# Patient Record
Sex: Male | Born: 1940
Health system: Southern US, Community
[De-identification: ages and names within clinical notes are randomized; demographics above are authoritative.]

## PROBLEM LIST (undated history)

## (undated) DIAGNOSIS — D369 Benign neoplasm, unspecified site: Secondary | ICD-10-CM

## (undated) DIAGNOSIS — R0789 Other chest pain: Secondary | ICD-10-CM

## (undated) DIAGNOSIS — Q819 Epidermolysis bullosa, unspecified: Secondary | ICD-10-CM

## (undated) DIAGNOSIS — I10 Essential (primary) hypertension: Secondary | ICD-10-CM

## (undated) DIAGNOSIS — F1021 Alcohol dependence, in remission: Secondary | ICD-10-CM

## (undated) DIAGNOSIS — R161 Splenomegaly, not elsewhere classified: Secondary | ICD-10-CM

## (undated) DIAGNOSIS — S72009A Fracture of unspecified part of neck of unspecified femur, initial encounter for closed fracture: Secondary | ICD-10-CM

## (undated) HISTORY — DX: Fracture of unspecified part of neck of unspecified femur, initial encounter for closed fracture: S72.009A

## (undated) HISTORY — PX: OTHER SURGICAL HISTORY: SHX169

## (undated) HISTORY — DX: Other chest pain: R07.89

## (undated) HISTORY — DX: Alcohol dependence, in remission: F10.21

## (undated) HISTORY — DX: Benign neoplasm, unspecified site: D36.9

## (undated) HISTORY — DX: Epidermolysis bullosa, unspecified: Q81.9

## (undated) HISTORY — DX: Splenomegaly, not elsewhere classified: R16.1

---

## 1999-05-10 ENCOUNTER — Emergency Department (HOSPITAL_COMMUNITY): Admission: EM | Admit: 1999-05-10 | Discharge: 1999-05-11 | Payer: Self-pay | Admitting: Emergency Medicine

## 1999-05-10 ENCOUNTER — Encounter: Payer: Self-pay | Admitting: Emergency Medicine

## 2000-07-01 ENCOUNTER — Encounter (INDEPENDENT_AMBULATORY_CARE_PROVIDER_SITE_OTHER): Payer: Self-pay | Admitting: *Deleted

## 2000-07-01 ENCOUNTER — Ambulatory Visit (HOSPITAL_COMMUNITY): Admission: RE | Admit: 2000-07-01 | Discharge: 2000-07-01 | Payer: Self-pay | Admitting: Gastroenterology

## 2000-08-27 ENCOUNTER — Encounter: Admission: RE | Admit: 2000-08-27 | Discharge: 2000-08-27 | Payer: Self-pay | Admitting: Neurology

## 2000-08-27 ENCOUNTER — Encounter: Payer: Self-pay | Admitting: Neurology

## 2004-03-25 ENCOUNTER — Ambulatory Visit (HOSPITAL_COMMUNITY): Admission: RE | Admit: 2004-03-25 | Discharge: 2004-03-25 | Payer: Self-pay | Admitting: Gastroenterology

## 2008-03-17 ENCOUNTER — Ambulatory Visit: Payer: Self-pay | Admitting: Internal Medicine

## 2008-03-24 ENCOUNTER — Encounter: Admission: RE | Admit: 2008-03-24 | Discharge: 2008-03-24 | Payer: Self-pay | Admitting: Internal Medicine

## 2009-06-22 ENCOUNTER — Ambulatory Visit: Payer: Self-pay | Admitting: Internal Medicine

## 2009-08-10 LAB — HM COLONOSCOPY

## 2010-05-31 NOTE — Procedures (Signed)
[place]  Patient:    Carlos [place], Carlos [place]                       MRN: [id number] Proc. Date: 07/01/00 Adm. Date:  [id number] Attending:  Rich Brave CC:         Luanna Cole. Lenord Fellers, M.D.   Procedure Report  PROCEDURE PERFORMED:  Colonoscopy with biopsies.  ENDOSCOPIST:  Florencia Reasons, M.D.  INDICATIONS FOR PROCEDURE:  The patient is a 70 year old gentleman with occasional rectal bleeding, for colon cancer screening.  FINDINGS:  Several diminutive sessile polyps, cold biopsied.  DESCRIPTION OF PROCEDURE:  The nature, purpose and risks of the procedure had been discussed with the patient, who provided written consent.  Sedation was fentanyl 40 mcg and Versed 4 mg IV without arrhythmias or desaturation.  Digital exam of the prostate was normal.  There did appear to be some hemorrhoidal skin tags and perianal skin excoriation.  The Olympus adult video colonoscope was advanced without difficulty to the cecum as identified by visualization of the appendiceal orifice and pull-back was then performed. The quality of the prep was excellent and it is felt that all areas were well seen.  There was a diminutive sessile polyp in the ascending colon and another one in the transverse colon, each removed by one or two cold biopsies, and also a couple of small hyperplastic-appearing sessile polyps measuring 2 or 3 mm in the rectum, also cold biopsied.  Retroflexion in the rectum prior to the rectal biopsies was unremarkable and pull-out through the anal canal demonstrated just small internal hemorrhoids.  The patient did have some mild sigmoid diverticulosis.  No colitis, large polyps, cancer or vascular malformations were seen.  The patient tolerated the procedure well and there were no apparent complications.  IMPRESSION: 1. Diminutive polyps as noted above. 2. Sigmoid diverticulosis. 3. No definite source for intermittent rectal bleeding  observed, so it is    presumed to be of hemorrhoidal origin.  PLAN:  Await pathology. DD:  07/01/00 TD:  07/01/00 Job: 2009 [id number]

## 2010-05-31 NOTE — Op Note (Signed)
Carlos [place], Carlos White                ACCOUNT NO.:  [id number]   MEDICAL RECORD NO.:  [id number]          PATIENT TYPE:  AMB   LOCATION:  ENDO                         FACILITY:  [facility]   PHYSICIAN:  Bernette Redbird, M.D.   DATE OF BIRTH:  December 27, 1940   DATE OF PROCEDURE:  03/25/2004  DATE OF DISCHARGE:                                 OPERATIVE REPORT   PROCEDURE:  Colonoscopy.   INDICATION:  Follow-up of small colonic adenomas from about four years ago.   FINDINGS:  Normal except for mild diverticulosis.   PROCEDURE:  The nature, purpose, risks of the procedure were familiar to the  patient from prior examination, he provided written consent. Sedation was  fentanyl 70 mcg and Versed 8 milligrams IV without arrhythmias or  desaturation. Digital exam of the prostate was unremarkable. The Olympus  adult adjustable tension video colonoscope was readily advanced to the cecum  as identified by visualization of the appendiceal orifice and the absence of  further lumen and pullback was then performed. The quality of prep was  excellent. It is felt that all areas were well seen.   There was some mild sigmoid diverticulosis but this was otherwise normal  examination. No polyps, cancer, colitis or vascular malformations were  observed. Retroflexion of the rectum and reinspection of the rectum were  otherwise unremarkable. No biopsies were obtained. The patient tolerated the  procedure well and there no apparent complications.   IMPRESSION:  1.  Prior history of colonic adenomas, without worrisome findings on current      examination (V12.72).  2.  Sigmoid diverticulosis.   PLAN:  Follow-up colonoscopy in five years.      RB/MEDQ  D:  03/25/2004  T:  03/25/2004  Job:  [id number]   cc:   Luanna Cole. Lenord Fellers, M.D.  [address], [place]  [place]  [place] [address]  Fax: [phone]

## 2010-06-14 ENCOUNTER — Encounter: Payer: Self-pay | Admitting: Internal Medicine

## 2010-06-14 ENCOUNTER — Ambulatory Visit (INDEPENDENT_AMBULATORY_CARE_PROVIDER_SITE_OTHER): Payer: Medicare Other | Admitting: Internal Medicine

## 2010-06-14 DIAGNOSIS — I10 Essential (primary) hypertension: Secondary | ICD-10-CM

## 2010-06-14 DIAGNOSIS — Q819 Epidermolysis bullosa, unspecified: Secondary | ICD-10-CM

## 2010-06-14 DIAGNOSIS — D126 Benign neoplasm of colon, unspecified: Secondary | ICD-10-CM | POA: Insufficient documentation

## 2010-06-14 DIAGNOSIS — Q828 Other specified congenital malformations of skin: Secondary | ICD-10-CM

## 2010-06-14 DIAGNOSIS — F1021 Alcohol dependence, in remission: Secondary | ICD-10-CM

## 2010-06-14 DIAGNOSIS — N400 Enlarged prostate without lower urinary tract symptoms: Secondary | ICD-10-CM

## 2010-07-04 ENCOUNTER — Encounter: Payer: Self-pay | Admitting: Internal Medicine

## 2010-07-13 ENCOUNTER — Encounter: Payer: Self-pay | Admitting: Internal Medicine

## 2010-07-13 DIAGNOSIS — I10 Essential (primary) hypertension: Secondary | ICD-10-CM | POA: Insufficient documentation

## 2010-07-13 DIAGNOSIS — N400 Enlarged prostate without lower urinary tract symptoms: Secondary | ICD-10-CM | POA: Insufficient documentation

## 2010-07-13 NOTE — Progress Notes (Signed)
  Subjective:    Patient ID: Carlos [place]., male    DOB: 04-03-1940, 70 y.o.   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 History of chest pain seen by  cardiologist 04/10/2008. Had fair but reduced exercise tolerance. Test was stopped because of fatigue. Dr. Deborah Chalk suggested he lose to around 200 pounds. No evidence of ischemia on exercise stress test. Patient brings in multiple blood pressure readings ranging from 128 systolically to 161 systolically over the past several days. Family history of hypertension. Diastolic pressures have been within normal limits.    Review of Systems     Objective:   Physical Exam neck supple without carotid bruits; chest clear to auscultation; cardiac exam regular rate and rhythm normal S1/S2 without gallop; extremities trace lower extremity edema        Assessment & Plan:  New onset hypertension  Benign prostatic hypertrophy treated with Flomax  Recovering alcoholic sober for many years  History of epidermolysis bullosa  Plan patient will get fasting lab work in the near future and return during the week of July for for physical examination at his request. I think he should start Norvasc generic 5 mg daily and we'll reevaluate him and review his lab work at time of physical exam

## 2010-07-19 ENCOUNTER — Telehealth: Payer: Self-pay

## 2010-07-19 ENCOUNTER — Encounter: Payer: Self-pay | Admitting: Internal Medicine

## 2010-07-19 ENCOUNTER — Ambulatory Visit (INDEPENDENT_AMBULATORY_CARE_PROVIDER_SITE_OTHER): Payer: Medicare Other | Admitting: Internal Medicine

## 2010-07-19 VITALS — BP 112/56 | HR 92 | Temp 97.5°F | Resp 20 | Ht 70.5 in | Wt 222.0 lb

## 2010-07-19 DIAGNOSIS — N400 Enlarged prostate without lower urinary tract symptoms: Secondary | ICD-10-CM

## 2010-07-19 DIAGNOSIS — Q819 Epidermolysis bullosa, unspecified: Secondary | ICD-10-CM

## 2010-07-19 DIAGNOSIS — Z0000 Encounter for general adult medical examination without abnormal findings: Secondary | ICD-10-CM

## 2010-07-19 DIAGNOSIS — I10 Essential (primary) hypertension: Secondary | ICD-10-CM

## 2010-07-19 DIAGNOSIS — D369 Benign neoplasm, unspecified site: Secondary | ICD-10-CM

## 2010-07-19 LAB — BASIC METABOLIC PANEL
BUN: 12 mg/dL (ref 6–23)
Chloride: 104 mEq/L (ref 96–112)
Creat: 0.87 mg/dL (ref 0.50–1.35)
Glucose, Bld: 73 mg/dL (ref 70–99)
Potassium: 4 mEq/L (ref 3.5–5.3)

## 2010-07-19 LAB — POCT URINALYSIS DIPSTICK
Bilirubin, UA: NEGATIVE
Blood, UA: NEGATIVE
Glucose, UA: NEGATIVE
Nitrite, UA: NEGATIVE
Spec Grav, UA: 1.01
Urobilinogen, UA: NEGATIVE
pH, UA: 7

## 2010-07-19 NOTE — Progress Notes (Signed)
Addended by: Nolen Mu on: 07/19/2010 03:47 PM   Modules accepted: Orders, Level of Service
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
## 2010-07-19 NOTE — Progress Notes (Signed)
Addended by: Judy Pimple on: 07/19/2010 04:13 PM   Modules accepted: Orders

## 2010-07-19 NOTE — Patient Instructions (Addendum)
Continue amlodipine 5 mg daily. Start Cozaar 50 mg daily. Take these medications 2 hours apart. Addendum. Pt was not placed on amlodipine at last visit as previously stated. He was placed on Cozaar 50 mg daily. B met obtained today. Increase Cozaar to 100 mg daily and follow up in 2 months.

## 2010-07-19 NOTE — Progress Notes (Signed)
  Subjective:    Patient ID: Carlos [place]., male    DOB: 16-Oct-1940, 70 y.o.   MRN: [id number]  HPI    Review of Systems     Objective:   Physical Exam        Assessment & Plan:  Addendum: Patient was not placed on amlodipine at last office visit his originally stated. Patient was instead placed on Cozaar 50 mg daily. At this time, we will increase Cozaar to 100 mg daily. Patient will return in 2 months for office visit blood pressure check and B- met

## 2010-07-22 ENCOUNTER — Encounter: Payer: Self-pay | Admitting: Internal Medicine

## 2010-07-26 NOTE — Telephone Encounter (Signed)
Refills completed

## 2010-08-22 ENCOUNTER — Encounter: Payer: Self-pay | Admitting: Internal Medicine

## 2010-08-23 ENCOUNTER — Other Ambulatory Visit: Payer: Self-pay | Admitting: Internal Medicine

## 2010-09-20 ENCOUNTER — Ambulatory Visit: Payer: Medicare Other | Admitting: Internal Medicine

## 2010-09-27 ENCOUNTER — Encounter: Payer: Self-pay | Admitting: Internal Medicine

## 2010-09-27 ENCOUNTER — Ambulatory Visit (INDEPENDENT_AMBULATORY_CARE_PROVIDER_SITE_OTHER): Payer: Medicare Other | Admitting: Internal Medicine

## 2010-09-27 VITALS — BP 130/66 | HR 80 | Temp 97.2°F | Wt 222.0 lb

## 2010-09-27 DIAGNOSIS — Q828 Other specified congenital malformations of skin: Secondary | ICD-10-CM

## 2010-09-27 DIAGNOSIS — Q819 Epidermolysis bullosa, unspecified: Secondary | ICD-10-CM

## 2010-09-27 DIAGNOSIS — I10 Essential (primary) hypertension: Secondary | ICD-10-CM

## 2010-09-27 DIAGNOSIS — N400 Enlarged prostate without lower urinary tract symptoms: Secondary | ICD-10-CM

## 2010-09-30 ENCOUNTER — Encounter: Payer: Self-pay | Admitting: Internal Medicine

## 2010-09-30 NOTE — Progress Notes (Signed)
  Subjective:    Patient ID: Carlos [place]., male    DOB: 07-16-1940, 70 y.o.   MRN: [id number]  HPI 70 year old white male with history of hypertension. We recently increased losartan from 50 mg to -100 mg daily and got better control of his blood pressure. He feels well on this dose. Says he will be cutting back on his work at [employer] in the next few weeks. History of BPH for which he takes Flomax. Takes Tramadol when necessary back pain. Is a recovering alcoholic for many years. He is an excellent alcohol counselor. Also has history of epidermolysis bullosa.    Review of Systems     Objective:   Physical Exam chest clear to auscultation; cardiac exam regular rate and rhythm; extremities without edema        Assessment & Plan:  Hypertension  BPH  Epidermolysis bullosa  Patient will get influenza immunization at [facility]. Return in 6 months for physical exam.

## 2010-12-05 ENCOUNTER — Other Ambulatory Visit: Payer: Self-pay | Admitting: Internal Medicine

## 2011-03-28 ENCOUNTER — Ambulatory Visit: Payer: Medicare Other | Admitting: Internal Medicine

## 2011-04-03 ENCOUNTER — Other Ambulatory Visit: Payer: Self-pay | Admitting: Internal Medicine

## 2011-07-25 ENCOUNTER — Other Ambulatory Visit: Payer: Medicare Other | Admitting: Internal Medicine

## 2011-07-25 ENCOUNTER — Ambulatory Visit: Payer: Medicare Other | Admitting: Internal Medicine

## 2011-07-28 ENCOUNTER — Encounter: Payer: Medicare Other | Admitting: Internal Medicine

## 2011-08-01 ENCOUNTER — Other Ambulatory Visit: Payer: Self-pay | Admitting: Internal Medicine

## 2011-08-01 NOTE — Telephone Encounter (Signed)
Please refill Tramadol

## 2011-08-04 ENCOUNTER — Other Ambulatory Visit: Payer: Self-pay

## 2011-08-04 MED ORDER — TRAMADOL HCL 50 MG PO TABS: 50.0000 mg | ORAL_TABLET | Freq: Three times a day (TID) | ORAL | Status: DC | PRN

## 2011-08-12 ENCOUNTER — Other Ambulatory Visit: Payer: Self-pay | Admitting: Internal Medicine

## 2011-08-14 ENCOUNTER — Other Ambulatory Visit: Payer: Medicare Other | Admitting: Internal Medicine

## 2011-08-14 DIAGNOSIS — N400 Enlarged prostate without lower urinary tract symptoms: Secondary | ICD-10-CM

## 2011-08-14 DIAGNOSIS — Z125 Encounter for screening for malignant neoplasm of prostate: Secondary | ICD-10-CM

## 2011-08-14 DIAGNOSIS — I10 Essential (primary) hypertension: Secondary | ICD-10-CM

## 2011-08-14 LAB — LIPID PANEL
Cholesterol: 166 mg/dL (ref 0–200)
Triglycerides: 31 mg/dL (ref <150)
VLDL: 6 mg/dL (ref 0–40)

## 2011-08-14 LAB — CBC WITH DIFFERENTIAL/PLATELET
Basophils Relative: 1 % (ref 0–1)
HCT: 38 % — ABNORMAL LOW (ref 39.0–52.0)
MCHC: 33.7 g/dL (ref 30.0–36.0)
Monocytes Absolute: 0.3 10*3/uL (ref 0.1–1.0)
Neutro Abs: 1.7 10*3/uL (ref 1.7–7.7)
Platelets: 181 10*3/uL (ref 150–400)
RDW: 13.3 % (ref 11.5–15.5)

## 2011-08-14 LAB — COMPREHENSIVE METABOLIC PANEL
ALT: 9 U/L (ref 0–53)
Albumin: 4.2 g/dL (ref 3.5–5.2)
CO2: 28 mEq/L (ref 19–32)
Calcium: 9.7 mg/dL (ref 8.4–10.5)
Chloride: 105 mEq/L (ref 96–112)
Glucose, Bld: 95 mg/dL (ref 70–99)
Sodium: 141 mEq/L (ref 135–145)
Total Bilirubin: 0.5 mg/dL (ref 0.3–1.2)
Total Protein: 6.5 g/dL (ref 6.0–8.3)

## 2011-08-15 ENCOUNTER — Ambulatory Visit (INDEPENDENT_AMBULATORY_CARE_PROVIDER_SITE_OTHER): Payer: Medicare Other | Admitting: Internal Medicine

## 2011-08-15 ENCOUNTER — Encounter: Payer: Self-pay | Admitting: Internal Medicine

## 2011-08-15 VITALS — BP 120/80 | HR 84 | Temp 98.0°F | Resp 12 | Ht 69.75 in | Wt 240.0 lb

## 2011-08-15 DIAGNOSIS — Z86010 Personal history of colonic polyps: Secondary | ICD-10-CM

## 2011-08-15 DIAGNOSIS — Q828 Other specified congenital malformations of skin: Secondary | ICD-10-CM

## 2011-08-15 DIAGNOSIS — Z0000 Encounter for general adult medical examination without abnormal findings: Secondary | ICD-10-CM

## 2011-08-15 DIAGNOSIS — Q819 Epidermolysis bullosa, unspecified: Secondary | ICD-10-CM

## 2011-08-15 DIAGNOSIS — N400 Enlarged prostate without lower urinary tract symptoms: Secondary | ICD-10-CM

## 2011-08-15 DIAGNOSIS — Z860101 Personal history of adenomatous and serrated colon polyps: Secondary | ICD-10-CM

## 2011-08-15 DIAGNOSIS — I10 Essential (primary) hypertension: Secondary | ICD-10-CM

## 2011-08-15 DIAGNOSIS — Z23 Encounter for immunization: Secondary | ICD-10-CM

## 2011-08-15 DIAGNOSIS — F1021 Alcohol dependence, in remission: Secondary | ICD-10-CM

## 2011-08-15 LAB — POCT URINALYSIS DIPSTICK
Bilirubin, UA: NEGATIVE
Glucose, UA: NEGATIVE
Ketones, UA: NEGATIVE
Leukocytes, UA: NEGATIVE
Spec Grav, UA: 1.02

## 2011-08-15 MED ORDER — LOSARTAN POTASSIUM 100 MG PO TABS: 100.0000 mg | ORAL_TABLET | Freq: Every day | ORAL | Status: DC
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
## 2011-09-12 ENCOUNTER — Other Ambulatory Visit: Payer: Self-pay | Admitting: Internal Medicine

## 2011-12-02 ENCOUNTER — Encounter: Payer: Self-pay | Admitting: Cardiology

## 2011-12-16 ENCOUNTER — Other Ambulatory Visit: Payer: Self-pay | Admitting: Internal Medicine

## 2011-12-21 ENCOUNTER — Encounter: Payer: Self-pay | Admitting: Internal Medicine

## 2011-12-21 NOTE — Patient Instructions (Addendum)
Return in 6 months. Continue same medication for hypertension.

## 2012-03-29 ENCOUNTER — Other Ambulatory Visit: Payer: Self-pay | Admitting: Internal Medicine

## 2012-07-03 ENCOUNTER — Other Ambulatory Visit: Payer: Self-pay | Admitting: Internal Medicine

## 2012-08-03 ENCOUNTER — Other Ambulatory Visit: Payer: Self-pay | Admitting: Internal Medicine

## 2012-10-01 ENCOUNTER — Other Ambulatory Visit: Payer: Self-pay | Admitting: Internal Medicine

## 2012-10-04 ENCOUNTER — Other Ambulatory Visit: Payer: Self-pay

## 2013-02-01 ENCOUNTER — Other Ambulatory Visit: Payer: Self-pay | Admitting: Internal Medicine

## 2013-02-01 ENCOUNTER — Other Ambulatory Visit: Payer: Self-pay

## 2013-04-19 ENCOUNTER — Other Ambulatory Visit: Payer: Medicare PPO | Admitting: Internal Medicine

## 2013-04-19 DIAGNOSIS — Z131 Encounter for screening for diabetes mellitus: Secondary | ICD-10-CM

## 2013-04-19 DIAGNOSIS — Z125 Encounter for screening for malignant neoplasm of prostate: Secondary | ICD-10-CM

## 2013-04-19 DIAGNOSIS — Z13220 Encounter for screening for lipoid disorders: Secondary | ICD-10-CM

## 2013-04-19 DIAGNOSIS — Z130 Encounter for screening for diseases of the blood and blood-forming organs and certain disorders involving the immune mechanism: Secondary | ICD-10-CM

## 2013-04-19 DIAGNOSIS — I10 Essential (primary) hypertension: Secondary | ICD-10-CM

## 2013-04-19 LAB — COMPREHENSIVE METABOLIC PANEL
ALT: 10 U/L (ref 0–53)
AST: 12 U/L (ref 0–37)
Albumin: 4 g/dL (ref 3.5–5.2)
Alkaline Phosphatase: 51 U/L (ref 39–117)
BILIRUBIN TOTAL: 0.5 mg/dL (ref 0.2–1.2)
BUN: 16 mg/dL (ref 6–23)
CALCIUM: 9.4 mg/dL (ref 8.4–10.5)
CO2: 26 meq/L (ref 19–32)
CREATININE: 0.87 mg/dL (ref 0.50–1.35)
Chloride: 105 mEq/L (ref 96–112)
Glucose, Bld: 92 mg/dL (ref 70–99)
Potassium: 4.6 mEq/L (ref 3.5–5.3)
Sodium: 140 mEq/L (ref 135–145)
Total Protein: 6.2 g/dL (ref 6.0–8.3)

## 2013-04-19 LAB — LIPID PANEL
CHOL/HDL RATIO: 3.7 ratio
CHOLESTEROL: 161 mg/dL (ref 0–200)
HDL: 44 mg/dL (ref >39)
LDL Cholesterol: 108 mg/dL — ABNORMAL HIGH (ref 0–99)
TRIGLYCERIDES: 44 mg/dL (ref <150)
VLDL: 9 mg/dL (ref 0–40)

## 2013-04-19 LAB — CBC WITH DIFFERENTIAL/PLATELET
Basophils Absolute: 0 10*3/uL (ref 0.0–0.1)
Basophils Relative: 1 % (ref 0–1)
EOS ABS: 0 10*3/uL (ref 0.0–0.7)
Eosinophils Relative: 1 % (ref 0–5)
HCT: 34.6 % — ABNORMAL LOW (ref 39.0–52.0)
Hemoglobin: 12.9 g/dL — ABNORMAL LOW (ref 13.0–17.0)
Lymphocytes Relative: 34 % (ref 12–46)
Lymphs Abs: 1.1 10*3/uL (ref 0.7–4.0)
MCH: 31.2 pg (ref 26.0–34.0)
MCHC: 35 g/dL (ref 30.0–36.0)
MCV: 86.1 fL (ref 78.0–100.0)
MONOS PCT: 7 % (ref 3–12)
Monocytes Absolute: 0.2 10*3/uL (ref 0.1–1.0)
Neutro Abs: 1.9 10*3/uL (ref 1.7–7.7)
Neutrophils Relative %: 57 % (ref 43–77)
PLATELETS: 175 10*3/uL (ref 150–400)
RBC: 4.14 MIL/uL — ABNORMAL LOW (ref 4.22–5.81)
RDW: 13.2 % (ref 11.5–15.5)
WBC: 3.3 10*3/uL — ABNORMAL LOW (ref 4.0–10.5)

## 2013-04-19 NOTE — Addendum Note (Signed)
Addended by: Brett Canales on: 04/19/2013 11:30 AM   Modules accepted: Orders

## 2013-04-20 LAB — PSA: PSA: 0.66 ng/mL (ref <=4.00)

## 2013-04-20 LAB — HEMOGLOBIN A1C
Hgb A1c MFr Bld: 5.3 % (ref <5.7)
Mean Plasma Glucose: 105 mg/dL (ref <117)

## 2013-04-21 ENCOUNTER — Encounter: Payer: Self-pay | Admitting: Internal Medicine

## 2013-04-29 ENCOUNTER — Encounter: Payer: Self-pay | Admitting: Internal Medicine

## 2013-04-29 ENCOUNTER — Ambulatory Visit (INDEPENDENT_AMBULATORY_CARE_PROVIDER_SITE_OTHER): Payer: Medicare PPO | Admitting: Internal Medicine

## 2013-04-29 VITALS — BP 136/68 | HR 76 | Ht 70.0 in | Wt 238.0 lb

## 2013-04-29 DIAGNOSIS — F1021 Alcohol dependence, in remission: Secondary | ICD-10-CM

## 2013-04-29 DIAGNOSIS — D72819 Decreased white blood cell count, unspecified: Secondary | ICD-10-CM

## 2013-04-29 DIAGNOSIS — D649 Anemia, unspecified: Secondary | ICD-10-CM

## 2013-04-29 DIAGNOSIS — Z860100 Personal history of colon polyps, unspecified: Secondary | ICD-10-CM

## 2013-04-29 DIAGNOSIS — M545 Low back pain, unspecified: Secondary | ICD-10-CM

## 2013-04-29 DIAGNOSIS — Z0000 Encounter for general adult medical examination without abnormal findings: Secondary | ICD-10-CM

## 2013-04-29 DIAGNOSIS — I10 Essential (primary) hypertension: Secondary | ICD-10-CM

## 2013-04-29 LAB — POCT URINALYSIS DIPSTICK
BILIRUBIN UA: NEGATIVE
GLUCOSE UA: NEGATIVE
Ketones, UA: NEGATIVE
Leukocytes, UA: NEGATIVE
Nitrite, UA: NEGATIVE
Protein, UA: NEGATIVE
RBC UA: NEGATIVE
Spec Grav, UA: 1.01
UROBILINOGEN UA: NEGATIVE
pH, UA: 5.5

## 2013-04-29 MED ORDER — TRAMADOL HCL 50 MG PO TABS: 50.0000 mg | ORAL_TABLET | Freq: Three times a day (TID) | ORAL | Status: DC | PRN

## 2013-04-30 ENCOUNTER — Other Ambulatory Visit: Payer: Self-pay | Admitting: Internal Medicine

## 2013-04-30 LAB — CBC WITH DIFFERENTIAL/PLATELET
Basophils Absolute: 0 10*3/uL (ref 0.0–0.1)
Basophils Relative: 1 % (ref 0–1)
EOS ABS: 0 10*3/uL (ref 0.0–0.7)
Eosinophils Relative: 1 % (ref 0–5)
HCT: 38.1 % — ABNORMAL LOW (ref 39.0–52.0)
HEMOGLOBIN: 13.5 g/dL (ref 13.0–17.0)
LYMPHS PCT: 28 % (ref 12–46)
Lymphs Abs: 1.4 10*3/uL (ref 0.7–4.0)
MCH: 31.4 pg (ref 26.0–34.0)
MCHC: 35.4 g/dL (ref 30.0–36.0)
MCV: 88.6 fL (ref 78.0–100.0)
MONOS PCT: 7 % (ref 3–12)
Monocytes Absolute: 0.3 10*3/uL (ref 0.1–1.0)
Neutro Abs: 3.1 10*3/uL (ref 1.7–7.7)
Neutrophils Relative %: 63 % (ref 43–77)
PLATELETS: 192 10*3/uL (ref 150–400)
RBC: 4.3 MIL/uL (ref 4.22–5.81)
RDW: 13.3 % (ref 11.5–15.5)
WBC: 4.9 10*3/uL (ref 4.0–10.5)

## 2013-04-30 LAB — VITAMIN B12: Vitamin B-12: 575 pg/mL (ref 211–911)

## 2013-04-30 LAB — IRON AND TIBC
%SAT: 32 % (ref 20–55)
IRON: 101 ug/dL (ref 42–165)
TIBC: 315 ug/dL (ref 215–435)
UIBC: 214 ug/dL (ref 125–400)

## 2013-04-30 LAB — RETICULOCYTES
ABS RETIC: 60.2 10*3/uL (ref 19.0–186.0)
RBC.: 4.3 MIL/uL (ref 4.22–5.81)
RETIC CT PCT: 1.4 % (ref 0.4–2.3)

## 2013-04-30 LAB — FOLATE
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
## 2013-04-30 NOTE — Patient Instructions (Addendum)
Return in July for repeat CBC and followup. Continue tramadol for back pain. Continue ARB for hypertension. Continue diet and exercise.

## 2013-04-30 NOTE — Telephone Encounter (Signed)
Refill for 90 days

## 2013-05-02 ENCOUNTER — Other Ambulatory Visit: Payer: Self-pay

## 2013-05-02 LAB — PATHOLOGIST SMEAR REVIEW

## 2013-05-27 ENCOUNTER — Other Ambulatory Visit: Payer: Self-pay | Admitting: Dermatology

## 2013-07-21 ENCOUNTER — Other Ambulatory Visit: Payer: Self-pay

## 2013-07-21 ENCOUNTER — Other Ambulatory Visit: Payer: Self-pay | Admitting: Internal Medicine

## 2013-08-02 ENCOUNTER — Other Ambulatory Visit: Payer: Medicare PPO | Admitting: Internal Medicine

## 2013-08-02 DIAGNOSIS — D72819 Decreased white blood cell count, unspecified: Secondary | ICD-10-CM

## 2013-08-02 DIAGNOSIS — D649 Anemia, unspecified: Secondary | ICD-10-CM

## 2013-08-02 LAB — CBC WITH DIFFERENTIAL/PLATELET
BASOS ABS: 0 10*3/uL (ref 0.0–0.1)
Basophils Relative: 1 % (ref 0–1)
EOS ABS: 0.1 10*3/uL (ref 0.0–0.7)
EOS PCT: 2 % (ref 0–5)
HEMATOCRIT: 37.8 % — AB (ref 39.0–52.0)
Hemoglobin: 13.3 g/dL (ref 13.0–17.0)
Lymphocytes Relative: 30 % (ref 12–46)
Lymphs Abs: 1 10*3/uL (ref 0.7–4.0)
MCH: 31.7 pg (ref 26.0–34.0)
MCHC: 35.2 g/dL (ref 30.0–36.0)
MCV: 90.2 fL (ref 78.0–100.0)
MONO ABS: 0.3 10*3/uL (ref 0.1–1.0)
Monocytes Relative: 8 % (ref 3–12)
Neutro Abs: 1.9 10*3/uL (ref 1.7–7.7)
Neutrophils Relative %: 59 % (ref 43–77)
PLATELETS: 172 10*3/uL (ref 150–400)
RBC: 4.19 MIL/uL — ABNORMAL LOW (ref 4.22–5.81)
RDW: 13.3 % (ref 11.5–15.5)
WBC: 3.3 10*3/uL — ABNORMAL LOW (ref 4.0–10.5)

## 2013-08-05 ENCOUNTER — Ambulatory Visit (INDEPENDENT_AMBULATORY_CARE_PROVIDER_SITE_OTHER): Payer: Medicare PPO | Admitting: Internal Medicine

## 2013-08-05 ENCOUNTER — Encounter: Payer: Self-pay | Admitting: Internal Medicine

## 2013-08-05 VITALS — BP 118/74 | HR 84

## 2013-08-05 DIAGNOSIS — D709 Neutropenia, unspecified: Secondary | ICD-10-CM | POA: Insufficient documentation

## 2013-08-05 NOTE — Patient Instructions (Signed)
Hematology appointment to be obtained. Continue antihypertensive medication

## 2013-08-05 NOTE — Progress Notes (Signed)
   Subjective:    Patient ID: Carlos White., male    DOB: 1940/05/05, 73 y.o.   MRN: [id number]  HPI  73 year old male in today to followup on low white blood cell count. Long-standing history of anemia which has been worked up. He has a history of epidermal lysis bullosa. No active infections. He has a history of hypertension and blood pressures under good control. We've been following white blood cell count and hemoglobin for some time. See serial CBCs. MCV is normal. He feels well. 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    Review of Systems     Objective:   Physical Exam  Not examined. Spent 15 minutes speaking with patient about concerns with CBC.      Assessment & Plan:  Neutropenia  Hypertension-stable on treatment  Plan: I'm going to ask that patient be seen by hematology for further evaluation. Explained to patient concern such as early myelodysplasia,etc.

## 2013-08-09 ENCOUNTER — Telehealth: Payer: Self-pay

## 2013-08-09 DIAGNOSIS — D709 Neutropenia, unspecified: Secondary | ICD-10-CM

## 2013-08-10 ENCOUNTER — Telehealth: Payer: Self-pay | Admitting: Hematology and Oncology

## 2013-08-10 NOTE — Telephone Encounter (Signed)
LEFT MESSAGE AND GAVE NP APPT FOR 08/06 @ 10 W/DR. Albert

## 2013-08-15 NOTE — Telephone Encounter (Signed)
Appointment made with hematologist.

## 2013-08-18 ENCOUNTER — Encounter: Payer: Self-pay | Admitting: Hematology and Oncology

## 2013-08-18 ENCOUNTER — Other Ambulatory Visit (HOSPITAL_BASED_OUTPATIENT_CLINIC_OR_DEPARTMENT_OTHER): Payer: Medicare HMO

## 2013-08-18 ENCOUNTER — Ambulatory Visit: Payer: Commercial Managed Care - HMO

## 2013-08-18 ENCOUNTER — Ambulatory Visit (HOSPITAL_BASED_OUTPATIENT_CLINIC_OR_DEPARTMENT_OTHER): Payer: Medicare HMO | Admitting: Hematology and Oncology

## 2013-08-18 ENCOUNTER — Telehealth: Payer: Self-pay | Admitting: Hematology and Oncology

## 2013-08-18 VITALS — BP 147/62 | HR 65 | Temp 98.0°F | Resp 18 | Ht 70.0 in | Wt 237.6 lb

## 2013-08-18 DIAGNOSIS — R161 Splenomegaly, not elsewhere classified: Secondary | ICD-10-CM | POA: Diagnosis not present

## 2013-08-18 DIAGNOSIS — D709 Neutropenia, unspecified: Secondary | ICD-10-CM

## 2013-08-18 HISTORY — DX: Splenomegaly, not elsewhere classified: R16.1

## 2013-08-18 LAB — COMPREHENSIVE METABOLIC PANEL (CC13)
ALT: 11 U/L (ref 0–55)
ANION GAP: 7 meq/L (ref 3–11)
AST: 15 U/L (ref 5–34)
Albumin: 3.7 g/dL (ref 3.5–5.0)
Alkaline Phosphatase: 55 U/L (ref 40–150)
BILIRUBIN TOTAL: 0.65 mg/dL (ref 0.20–1.20)
BUN: 10 mg/dL (ref 7.0–26.0)
CO2: 25 meq/L (ref 22–29)
Calcium: 9.4 mg/dL (ref 8.4–10.4)
Chloride: 108 mEq/L (ref 98–109)
Creatinine: 0.8 mg/dL (ref 0.7–1.3)
Glucose: 92 mg/dl (ref 70–140)
Potassium: 4.3 mEq/L (ref 3.5–5.1)
SODIUM: 140 meq/L (ref 136–145)
TOTAL PROTEIN: 6.4 g/dL (ref 6.4–8.3)

## 2013-08-18 LAB — CBC WITH DIFFERENTIAL/PLATELET
BASO%: 1.3 % (ref 0.0–2.0)
BASOS ABS: 0 10*3/uL (ref 0.0–0.1)
EOS ABS: 0.1 10*3/uL (ref 0.0–0.5)
EOS%: 3.1 % (ref 0.0–7.0)
HCT: 38.2 % — ABNORMAL LOW (ref 38.4–49.9)
HEMOGLOBIN: 13 g/dL (ref 13.0–17.1)
LYMPH%: 36.4 % (ref 14.0–49.0)
MCH: 31.5 pg (ref 27.2–33.4)
MCHC: 33.9 g/dL (ref 32.0–36.0)
MCV: 92.9 fL (ref 79.3–98.0)
MONO#: 0.2 10*3/uL (ref 0.1–0.9)
MONO%: 7.5 % (ref 0.0–14.0)
NEUT#: 1.7 10*3/uL (ref 1.5–6.5)
NEUT%: 51.7 % (ref 39.0–75.0)
Platelets: 175 10*3/uL (ref 140–400)
RBC: 4.11 10*6/uL — ABNORMAL LOW (ref 4.20–5.82)
RDW: 12.9 % (ref 11.0–14.6)
WBC: 3.2 10*3/uL — ABNORMAL LOW (ref 4.0–10.3)
lymph#: 1.2 10*3/uL (ref 0.9–3.3)

## 2013-08-18 LAB — MORPHOLOGY: PLT EST: ADEQUATE

## 2013-08-18 LAB — LACTATE DEHYDROGENASE (CC13): LDH: 164 U/L (ref 125–245)

## 2013-08-18 NOTE — Progress Notes (Signed)
Checked in new pt with no financial concerns. °

## 2013-08-18 NOTE — Assessment & Plan Note (Signed)
Cause is unknown. He is not symptomatic. With the probable tip of spleen, this could either be due to sequestration or related to autoimmune disease or myeloproliferative disease. I will order additional workup.

## 2013-08-18 NOTE — Assessment & Plan Note (Signed)
The tip of the spleen is palpable. The patient had significant alcoholism. I will order imaging study for further evaluation and he agreed.

## 2013-08-18 NOTE — Progress Notes (Signed)
Carlos White CONSULT NOTE  Patient Care Team: Elby Showers, MD as PCP - General (Internal Medicine)  CHIEF COMPLAINTS/PURPOSE OF CONSULTATION:  Leukopenia  HISTORY OF PRESENTING ILLNESS:  Carlos White. 73 y.o. male is here because of neutropenia.  He was found to have abnormal CBC from routine blood test monitoring. Since 2013 to present, his white blood cell count fluctuated from 3 point to 4.9. He denies recent infection. Denies diagnosis of autoimmune condition. He denies recent alcoholism. He cannot remember what was his last dose of antibiotic treatment. He denies prior blood or platelet transfusions MEDICAL HISTORY:  Past Medical History  Diagnosis Date  . History of alcoholism   . Epidermolysis bullosa   . Adenomatous polyp   . Atypical chest pain   . Splenomegaly 08/18/2013    SURGICAL HISTORY: Past Surgical History  Procedure Laterality Date  . Left leg surgery      SOCIAL HISTORY: History   Social History  . Marital Status: Single    Spouse Name: N/A    Number of Children: N/A  . Years of Education: N/A   Occupational History  . Not on file.   Social History Main Topics  . Smoking status: Former Smoker -- 2.00 packs/day for 20 years    Types: Cigarettes    Quit date: 01/14/1983  . Smokeless tobacco: Never Used  . Alcohol Use: No  . Drug Use: No  . Sexual Activity: Not on file   Other Topics Concern  . Not on file   Social History Narrative  . No narrative on file    FAMILY HISTORY: Family History  Problem Relation Age of Onset  . Hypertension Mother   . Diabetes Mother   . Heart disease Father     ALLERGIES:  has No Known Allergies.  MEDICATIONS:  Current Outpatient Prescriptions  Medication Sig Dispense Refill  . aspirin 81 MG chewable tablet Chew 81 mg by mouth daily.        Marland Kitchen b complex vitamins capsule Take 1 capsule by mouth daily.      . cetirizine (ZYRTEC) 10 MG tablet Take 10 mg by mouth daily.      . fish  oil-omega-3 fatty acids 1000 MG capsule Take 2 g by mouth daily.      Marland Kitchen losartan (COZAAR) 100 MG tablet TAKE 1 TABLET EVERY DAY  30 tablet  6  . multivitamin (THERAGRAN) per tablet Take 1 tablet by mouth daily.        . tamsulosin (FLOMAX) 0.4 MG CAPS capsule TAKE AS DIRECTED  30 capsule  11  . traMADol (ULTRAM) 50 MG tablet TAKE 1 TABLET BY MOUTH EVERY 8 HOURS AS NEEDED  90 tablet  1   No current facility-administered medications for this visit.    REVIEW OF SYSTEMS:   Constitutional: Denies fevers, chills or abnormal night sweats Eyes: Denies blurriness of vision, double vision or watery eyes Ears, nose, mouth, throat, and face: Denies mucositis or sore throat Respiratory: Denies cough, dyspnea or wheezes Cardiovascular: Denies palpitation, chest discomfort or lower extremity swelling Gastrointestinal:  Denies nausea, heartburn or change in bowel habits Skin: Denies abnormal skin rashes Lymphatics: Denies new lymphadenopathy or easy bruising Neurological:Denies numbness, tingling or new weaknesses Behavioral/Psych: Mood is stable, no new changes  All other systems were reviewed with the patient and are negative.  PHYSICAL EXAMINATION: ECOG PERFORMANCE STATUS: 0 - Asymptomatic  Filed Vitals:   08/18/13 1021  BP: 147/62  Pulse: 65  Temp:  22 F (36.7 C)  Resp: 18   Filed Weights   08/18/13 1021  Weight: 237 lb 9.6 oz (107.775 kg)    GENERAL:alert, no distress and comfortable SKIN: skin color, texture, turgor are normal, no rashes or significant lesions EYES: normal, conjunctiva are pink and non-injected, sclera clear OROPHARYNX:no exudate, no erythema and lips, buccal mucosa, and tongue normal  NECK: supple, thyroid normal size, non-tender, without nodularity LYMPH:  no palpable lymphadenopathy in the cervical, axillary or inguinal LUNGS: clear to auscultation and percussion with normal breathing effort HEART: regular rate & rhythm and no murmurs and no lower extremity  edema ABDOMEN:abdomen soft, non-tender and normal bowel sounds. The tip of his spleen is palpable Musculoskeletal:no cyanosis of digits and no clubbing  PSYCH: alert & oriented x 3 with fluent speech NEURO: no focal motor/sensory deficits  LABORATORY DATA:  I have reviewed the data as listed Recent Results (from the past 2160 hour(s))  CBC WITH DIFFERENTIAL     Status: Abnormal   Collection Time    08/02/13  9:05 AM      Result Value Ref Range   WBC 3.3 (*) 4.0 - 10.5 K/uL   RBC 4.19 (*) 4.22 - 5.81 MIL/uL   Hemoglobin 13.3  13.0 - 17.0 g/dL   HCT 37.8 (*) 39.0 - 52.0 %   MCV 90.2  78.0 - 100.0 fL   MCH 31.7  26.0 - 34.0 pg   MCHC 35.2  30.0 - 36.0 g/dL   RDW 13.3  11.5 - 15.5 %   Platelets 172  150 - 400 K/uL   Neutrophils Relative % 59  43 - 77 %   Neutro Abs 1.9  1.7 - 7.7 K/uL   Lymphocytes Relative 30  12 - 46 %   Lymphs Abs 1.0  0.7 - 4.0 K/uL   Monocytes Relative 8  3 - 12 %   Monocytes Absolute 0.3  0.1 - 1.0 K/uL   Eosinophils Relative 2  0 - 5 %   Eosinophils Absolute 0.1  0.0 - 0.7 K/uL   Basophils Relative 1  0 - 1 %   Basophils Absolute 0.0  0.0 - 0.1 K/uL   Smear Review Criteria for review not met    CBC WITH DIFFERENTIAL     Status: Abnormal   Collection Time    08/18/13 11:21 AM      Result Value Ref Range   WBC 3.2 (*) 4.0 - 10.3 10e3/uL   NEUT# 1.7  1.5 - 6.5 10e3/uL   HGB 13.0  13.0 - 17.1 g/dL   HCT 38.2 (*) 38.4 - 49.9 %   Platelets 175  140 - 400 10e3/uL   MCV 92.9  79.3 - 98.0 fL   MCH 31.5  27.2 - 33.4 pg   MCHC 33.9  32.0 - 36.0 g/dL   RBC 4.11 (*) 4.20 - 5.82 10e6/uL   RDW 12.9  11.0 - 14.6 %   lymph# 1.2  0.9 - 3.3 10e3/uL   MONO# 0.2  0.1 - 0.9 10e3/uL   Eosinophils Absolute 0.1  0.0 - 0.5 10e3/uL   Basophils Absolute 0.0  0.0 - 0.1 10e3/uL   NEUT% 51.7  39.0 - 75.0 %   LYMPH% 36.4  14.0 - 49.0 %   MONO% 7.5  0.0 - 14.0 %   EOS% 3.1  0.0 - 7.0 %   BASO% 1.3  0.0 - 2.0 %  LACTATE DEHYDROGENASE (CC13)     Status: None   Collection  Time    08/18/13 11:21 AM      Result Value Ref Range   LDH 164  125 - 245 U/L  MORPHOLOGY     Status: None   Collection Time    08/18/13 11:21 AM      Result Value Ref Range   Ovalocytes Few  Negative   White Cell Comments C/W auto diff     PLT EST Adequate  Adequate  COMPREHENSIVE METABOLIC PANEL (FK81)     Status: None   Collection Time    08/18/13 11:21 AM      Result Value Ref Range   Sodium 140  136 - 145 mEq/L   Potassium 4.3  3.5 - 5.1 mEq/L   Chloride 108  98 - 109 mEq/L   CO2 25  22 - 29 mEq/L   Glucose 92  70 - 140 mg/dl   BUN 10.0  7.0 - 26.0 mg/dL   Creatinine 0.8  0.7 - 1.3 mg/dL   Total Bilirubin 0.65  0.20 - 1.20 mg/dL   Alkaline Phosphatase 55  40 - 150 U/L   AST 15  5 - 34 U/L   ALT 11  0 - 55 U/L   Total Protein 6.4  6.4 - 8.3 g/dL   Albumin 3.7  3.5 - 5.0 g/dL   Calcium 9.4  8.4 - 10.4 mg/dL   Anion Gap 7  3 - 11 mEq/L  ASSESSMENT & PLAN   Neutropenia Cause is unknown. He is not symptomatic. With the probable tip of spleen, this could either be due to sequestration or related to autoimmune disease or myeloproliferative disease. I will order additional workup.  Splenomegaly The tip of the spleen is palpable. The patient had significant alcoholism. I will order imaging study for further evaluation and he agreed.

## 2013-08-18 NOTE — Telephone Encounter (Signed)
Pt confirmed labs/ov per 08/06 POF, gave pt AVS....KJ °

## 2013-08-19 LAB — SEDIMENTATION RATE: Sed Rate: 4 mm/hr (ref 0–16)

## 2013-08-19 LAB — ANA: Anti Nuclear Antibody(ANA): NEGATIVE

## 2013-08-23 ENCOUNTER — Telehealth: Payer: Self-pay | Admitting: *Deleted

## 2013-08-23 NOTE — Telephone Encounter (Signed)
Left message for patient canceling lab appt on 8/13. Had labs drawn on 8/6 and we can use those results. To keep all other scheduled appointments

## 2013-08-25 ENCOUNTER — Other Ambulatory Visit: Payer: Commercial Managed Care - HMO

## 2013-08-25 ENCOUNTER — Ambulatory Visit (HOSPITAL_COMMUNITY)
Admission: RE | Admit: 2013-08-25 | Discharge: 2013-08-25 | Disposition: A | Discharge status: Home / Self Care | Payer: Medicare HMO | Source: Ambulatory Visit | Attending: Hematology and Oncology | Admitting: Hematology and Oncology

## 2013-08-25 ENCOUNTER — Encounter (HOSPITAL_COMMUNITY): Payer: Self-pay

## 2013-08-25 DIAGNOSIS — R161 Splenomegaly, not elsewhere classified: Secondary | ICD-10-CM

## 2013-08-25 DIAGNOSIS — D709 Neutropenia, unspecified: Secondary | ICD-10-CM | POA: Insufficient documentation

## 2013-08-25 HISTORY — DX: Essential (primary) hypertension: I10

## 2013-08-25 MED ORDER — IOHEXOL 300 MG/ML  SOLN
100.0000 mL | Freq: Once | INTRAMUSCULAR | Status: AC | PRN
  Administered 2013-08-25: 100 mL via INTRAVENOUS

## 2013-08-29 ENCOUNTER — Ambulatory Visit (HOSPITAL_BASED_OUTPATIENT_CLINIC_OR_DEPARTMENT_OTHER): Payer: Medicare HMO | Admitting: Hematology and Oncology

## 2013-08-29 ENCOUNTER — Encounter: Payer: Self-pay | Admitting: Hematology and Oncology

## 2013-08-29 VITALS — BP 134/65 | HR 74 | Temp 97.9°F | Resp 18 | Ht 70.0 in | Wt 235.6 lb

## 2013-08-29 DIAGNOSIS — D72819 Decreased white blood cell count, unspecified: Secondary | ICD-10-CM

## 2013-08-29 NOTE — Assessment & Plan Note (Signed)
The cause is unknown. He is not symptomatic. CT scan show no splenomegaly. Screening test for autoimmune disease was negative. Recommend observation with PCP only. I will be happy to see him back if the total WBC dropped to less than 3 or if the absolute neutrophil count dropped to less than 1.5. There is little yield with bone marrow aspirate and biopsy right now and he agreed with the plan of care.

## 2013-08-29 NOTE — Progress Notes (Signed)
[place] OFFICE PROGRESS NOTE  Elby Showers, MD SUMMARY OF HEMATOLOGIC HISTORY:  He was found to have abnormal CBC from routine blood test monitoring. Since 2013 to present, his white blood cell count fluctuated from 3 point to 4.9. He denies recent infection. Denies diagnosis of autoimmune condition. He denies recent alcoholism. He cannot remember what was his last dose of antibiotic treatment. He denies prior blood or platelet transfusions  INTERVAL HISTORY: Carlos White 73 y.o. male returns for further followup on test results. He feels well.  I have reviewed the past medical history, past surgical history, social history and family history with the patient and they are unchanged from previous note.  ALLERGIES:  has No Known Allergies.  MEDICATIONS:  Current Outpatient Prescriptions  Medication Sig Dispense Refill  . aspirin 81 MG chewable tablet Chew 81 mg by mouth daily.        Marland Kitchen b complex vitamins capsule Take 1 capsule by mouth daily.      . cetirizine (ZYRTEC) 10 MG tablet Take 10 mg by mouth daily.      . fish oil-omega-3 fatty acids 1000 MG capsule Take 2 g by mouth daily.      Marland Kitchen losartan (COZAAR) 100 MG tablet TAKE 1 TABLET EVERY DAY  30 tablet  6  . multivitamin (THERAGRAN) per tablet Take 1 tablet by mouth daily.        . tamsulosin (FLOMAX) 0.4 MG CAPS capsule TAKE AS DIRECTED  30 capsule  11  . traMADol (ULTRAM) 50 MG tablet TAKE 1 TABLET BY MOUTH EVERY 8 HOURS AS NEEDED  90 tablet  1   No current facility-administered medications for this visit.     REVIEW OF SYSTEMS:   All other systems were reviewed with the patient and are negative.  PHYSICAL EXAMINATION: ECOG PERFORMANCE STATUS: 0 - Asymptomatic  Filed Vitals:   08/29/13 1324  BP: 134/65  Pulse: 74  Temp: 97.9 F (36.6 C)  Resp: 18   Filed Weights   08/29/13 1324  Weight: 235 lb 9.6 oz (106.867 kg)    GENERAL:alert, no distress and comfortable SKIN: skin color, texture,  turgor are normal, no rashes or significant lesions EYES: normal, Conjunctiva are pink and non-injected, sclera clear Musculoskeletal:no cyanosis of digits and no clubbing  NEURO: alert & oriented x 3 with fluent speech, no focal motor/sensory deficits  LABORATORY DATA:  I have reviewed the data as listed No results found for this or any previous visit (from the past 48 hour(s)).  Lab Results  Component Value Date   WBC 3.2* 08/18/2013   HGB 13.0 08/18/2013   HCT 38.2* 08/18/2013   MCV 92.9 08/18/2013   PLT 175 08/18/2013    RADIOGRAPHIC STUDIES: I reviewed the CT scan with him and his wife I have personally reviewed the radiological images as listed and agreed with the findings in the report.  ASSESSMENT & PLAN:  Leukopenia The cause is unknown. He is not symptomatic. CT scan show no splenomegaly. Screening test for autoimmune disease was negative. Recommend observation with PCP only. I will be happy to see him back if the total WBC dropped to less than 3 or if the absolute neutrophil count dropped to less than 1.5. There is little yield with bone marrow aspirate and biopsy right now and he agreed with the plan of care.    All questions were answered. The patient knows to call the clinic with any problems, questions or concerns. No barriers to learning  was detected.  I spent 15 minutes counseling the patient face to face. The total time spent in the appointment was 20 minutes and more than 50% was on counseling.     [facility], [place], MD 08/29/2013 8:39 PM

## 2013-09-04 ENCOUNTER — Other Ambulatory Visit: Payer: Self-pay | Admitting: Internal Medicine

## 2013-10-06 ENCOUNTER — Other Ambulatory Visit: Payer: Self-pay | Admitting: Internal Medicine

## 2013-11-01 ENCOUNTER — Other Ambulatory Visit: Payer: Self-pay | Admitting: Internal Medicine

## 2013-11-02 ENCOUNTER — Other Ambulatory Visit: Payer: Self-pay

## 2013-11-02 MED ORDER — TRAMADOL HCL 50 MG PO TABS: ORAL_TABLET | ORAL | Status: DC

## 2013-12-21 ENCOUNTER — Encounter: Payer: Self-pay | Admitting: Internal Medicine

## 2013-12-21 NOTE — Progress Notes (Signed)
Patient received flu vaccine at work.  Exact date unknown.

## 2014-01-12 ENCOUNTER — Other Ambulatory Visit: Payer: Self-pay | Admitting: Internal Medicine

## 2014-02-17 DIAGNOSIS — H3531 Nonexudative age-related macular degeneration: Secondary | ICD-10-CM | POA: Diagnosis not present

## 2014-02-17 DIAGNOSIS — H43813 Vitreous degeneration, bilateral: Secondary | ICD-10-CM | POA: Diagnosis not present

## 2014-03-16 ENCOUNTER — Other Ambulatory Visit: Payer: Self-pay | Admitting: Internal Medicine

## 2014-03-16 NOTE — Telephone Encounter (Signed)
Refill x 6 months 

## 2014-03-17 ENCOUNTER — Other Ambulatory Visit: Payer: Self-pay | Admitting: *Deleted

## 2014-03-17 MED ORDER — TRAMADOL HCL 50 MG PO TABS
50.0000 mg | ORAL_TABLET | Freq: Three times a day (TID) | ORAL | Status: DC | PRN
Start: 2014-03-17 — End: 2014-11-08

## 2014-03-17 NOTE — Telephone Encounter (Signed)
Called in script for tramadol to patient pharmacy

## 2014-11-08 ENCOUNTER — Other Ambulatory Visit: Payer: Self-pay | Admitting: Internal Medicine

## 2014-11-08 NOTE — Telephone Encounter (Signed)
Refill Tramadol one time only. Flomax and Losartan can be refilled x 6 months

## 2014-11-10 ENCOUNTER — Other Ambulatory Visit: Payer: Self-pay | Admitting: Gastroenterology

## 2014-11-10 DIAGNOSIS — Z09 Encounter for follow-up examination after completed treatment for conditions other than malignant neoplasm: Secondary | ICD-10-CM | POA: Diagnosis not present

## 2014-11-10 DIAGNOSIS — D124 Benign neoplasm of descending colon: Secondary | ICD-10-CM | POA: Diagnosis not present

## 2014-11-10 DIAGNOSIS — K635 Polyp of colon: Secondary | ICD-10-CM | POA: Diagnosis not present

## 2014-11-10 DIAGNOSIS — D126 Benign neoplasm of colon, unspecified: Secondary | ICD-10-CM | POA: Diagnosis not present

## 2014-11-10 DIAGNOSIS — D120 Benign neoplasm of cecum: Secondary | ICD-10-CM | POA: Diagnosis not present

## 2014-11-10 DIAGNOSIS — Z86010 Personal history of colonic polyps: Secondary | ICD-10-CM | POA: Diagnosis not present

## 2014-11-10 DIAGNOSIS — D123 Benign neoplasm of transverse colon: Secondary | ICD-10-CM | POA: Diagnosis not present

## 2015-01-19 ENCOUNTER — Other Ambulatory Visit: Payer: Medicare Other | Admitting: Internal Medicine

## 2015-01-19 DIAGNOSIS — D649 Anemia, unspecified: Secondary | ICD-10-CM

## 2015-01-19 DIAGNOSIS — D72819 Decreased white blood cell count, unspecified: Secondary | ICD-10-CM

## 2015-01-19 DIAGNOSIS — Z79899 Other long term (current) drug therapy: Secondary | ICD-10-CM

## 2015-01-19 DIAGNOSIS — Z0000 Encounter for general adult medical examination without abnormal findings: Secondary | ICD-10-CM

## 2015-01-19 DIAGNOSIS — Z1211 Encounter for screening for malignant neoplasm of colon: Secondary | ICD-10-CM

## 2015-01-19 DIAGNOSIS — I10 Essential (primary) hypertension: Secondary | ICD-10-CM

## 2015-01-19 LAB — CBC WITH DIFFERENTIAL/PLATELET
BASOS ABS: 0 10*3/uL (ref 0.0–0.1)
Basophils Relative: 0 % (ref 0–1)
EOS ABS: 0 10*3/uL (ref 0.0–0.7)
Eosinophils Relative: 1 % (ref 0–5)
HEMATOCRIT: 36.7 % — AB (ref 39.0–52.0)
Hemoglobin: 12.5 g/dL — ABNORMAL LOW (ref 13.0–17.0)
LYMPHS ABS: 0.9 10*3/uL (ref 0.7–4.0)
LYMPHS PCT: 27 % (ref 12–46)
MCH: 31.2 pg (ref 26.0–34.0)
MCHC: 34.1 g/dL (ref 30.0–36.0)
MCV: 91.5 fL (ref 78.0–100.0)
MONOS PCT: 7 % (ref 3–12)
MPV: 9.2 fL (ref 8.6–12.4)
Monocytes Absolute: 0.2 10*3/uL (ref 0.1–1.0)
Neutro Abs: 2.2 10*3/uL (ref 1.7–7.7)
Neutrophils Relative %: 65 % (ref 43–77)
Platelets: 175 10*3/uL (ref 150–400)
RBC: 4.01 MIL/uL — ABNORMAL LOW (ref 4.22–5.81)
RDW: 12.9 % (ref 11.5–15.5)
WBC: 3.4 10*3/uL — ABNORMAL LOW (ref 4.0–10.5)

## 2015-01-19 LAB — LIPID PANEL
Cholesterol: 155 mg/dL (ref 125–200)
HDL: 60 mg/dL (ref >=40)
LDL CALC: 88 mg/dL (ref <130)
TRIGLYCERIDES: 33 mg/dL (ref <150)
Total CHOL/HDL Ratio: 2.6 Ratio (ref <=5.0)
VLDL: 7 mg/dL (ref <30)

## 2015-01-19 LAB — COMPLETE METABOLIC PANEL WITH GFR
ALT: 11 U/L (ref 9–46)
AST: 16 U/L (ref 10–35)
Albumin: 4.1 g/dL (ref 3.6–5.1)
Alkaline Phosphatase: 59 U/L (ref 40–115)
BILIRUBIN TOTAL: 0.7 mg/dL (ref 0.2–1.2)
BUN: 11 mg/dL (ref 7–25)
CALCIUM: 9.3 mg/dL (ref 8.6–10.3)
CO2: 26 mmol/L (ref 20–31)
CREATININE: 0.87 mg/dL (ref 0.70–1.18)
Chloride: 104 mmol/L (ref 98–110)
GFR, EST NON AFRICAN AMERICAN: 84 mL/min (ref >=60)
Glucose, Bld: 98 mg/dL (ref 65–99)
Potassium: 4.2 mmol/L (ref 3.5–5.3)
Sodium: 139 mmol/L (ref 135–146)
TOTAL PROTEIN: 6.5 g/dL (ref 6.1–8.1)

## 2015-01-20 LAB — PSA: PSA: 0.64 ng/mL (ref <=4.00)

## 2015-01-22 ENCOUNTER — Encounter: Payer: Self-pay | Admitting: Internal Medicine

## 2015-01-22 ENCOUNTER — Ambulatory Visit (INDEPENDENT_AMBULATORY_CARE_PROVIDER_SITE_OTHER): Payer: Medicare Other | Admitting: Internal Medicine

## 2015-01-22 VITALS — BP 120/60 | HR 65 | Temp 97.2°F | Resp 18 | Ht 70.0 in | Wt 228.5 lb

## 2015-01-22 DIAGNOSIS — J309 Allergic rhinitis, unspecified: Secondary | ICD-10-CM

## 2015-01-22 DIAGNOSIS — M7918 Myalgia, other site: Secondary | ICD-10-CM

## 2015-01-22 DIAGNOSIS — F1021 Alcohol dependence, in remission: Secondary | ICD-10-CM

## 2015-01-22 DIAGNOSIS — D72819 Decreased white blood cell count, unspecified: Secondary | ICD-10-CM

## 2015-01-22 DIAGNOSIS — I10 Essential (primary) hypertension: Secondary | ICD-10-CM | POA: Diagnosis not present

## 2015-01-22 DIAGNOSIS — B354 Tinea corporis: Secondary | ICD-10-CM

## 2015-01-22 DIAGNOSIS — N400 Enlarged prostate without lower urinary tract symptoms: Secondary | ICD-10-CM

## 2015-01-22 DIAGNOSIS — M791 Myalgia: Secondary | ICD-10-CM | POA: Diagnosis not present

## 2015-01-22 DIAGNOSIS — Q819 Epidermolysis bullosa, unspecified: Secondary | ICD-10-CM

## 2015-01-22 DIAGNOSIS — Z23 Encounter for immunization: Secondary | ICD-10-CM | POA: Diagnosis not present

## 2015-01-23 ENCOUNTER — Encounter: Payer: Self-pay | Admitting: Internal Medicine

## 2015-01-23 ENCOUNTER — Telehealth: Payer: Self-pay

## 2015-01-23 DIAGNOSIS — F1021 Alcohol dependence, in remission: Secondary | ICD-10-CM | POA: Insufficient documentation

## 2015-01-23 DIAGNOSIS — M7918 Myalgia, other site: Secondary | ICD-10-CM | POA: Insufficient documentation

## 2015-01-23 MED ORDER — KETOCONAZOLE 2 % EX CREA: TOPICAL_CREAM | CUTANEOUS | Status: DC
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
## 2015-01-23 NOTE — Telephone Encounter (Signed)
Patient states that he was supposed to get a prescription for ringworm. I do not see this in chart please advise.

## 2015-01-23 NOTE — Telephone Encounter (Signed)
Will e-scribe Nizoral cream

## 2015-02-11 NOTE — Patient Instructions (Signed)
Prevnar 13 given today. It was a pleasure to see you. Return in one year or as needed.

## 2015-02-15 ENCOUNTER — Other Ambulatory Visit: Payer: Self-pay | Admitting: Internal Medicine

## 2015-02-15 NOTE — Telephone Encounter (Signed)
We previously received this request- thought it had been handled.

## 2015-02-15 NOTE — Telephone Encounter (Signed)
Phoned to pharmacy 

## 2015-04-05 ENCOUNTER — Other Ambulatory Visit: Payer: Self-pay | Admitting: Internal Medicine

## 2015-04-05 NOTE — Telephone Encounter (Signed)
Refill x 3 months 

## 2015-04-06 NOTE — Telephone Encounter (Signed)
[facility] to [facility] 

## 2015-05-08 ENCOUNTER — Other Ambulatory Visit: Payer: Self-pay | Admitting: Internal Medicine

## 2015-08-27 ENCOUNTER — Other Ambulatory Visit: Payer: Self-pay | Admitting: Internal Medicine

## 2015-08-27 NOTE — Telephone Encounter (Signed)
Please refill once 

## 2015-10-02 ENCOUNTER — Other Ambulatory Visit: Payer: Self-pay | Admitting: Internal Medicine

## 2015-10-02 NOTE — Telephone Encounter (Signed)
Spoke with patient by phone. He's taking 2 tramadol tablets daily for back pain. Call in #90 one by mouth 2-3 times daily as needed for pain with one refill.

## 2015-10-31 ENCOUNTER — Other Ambulatory Visit: Payer: Self-pay | Admitting: *Deleted

## 2015-10-31 MED ORDER — TAMSULOSIN HCL 0.4 MG PO CAPS: ORAL_CAPSULE | ORAL | 4 refills | Status: DC

## 2015-10-31 MED ORDER — LOSARTAN POTASSIUM 100 MG PO TABS: 100.0000 mg | ORAL_TABLET | Freq: Every day | ORAL | 4 refills | Status: DC

## 2015-10-31 MED ORDER — TAMSULOSIN HCL 0.4 MG PO CAPS: ORAL_CAPSULE | ORAL | 0 refills | Status: DC

## 2015-10-31 MED ORDER — LOSARTAN POTASSIUM 100 MG PO TABS: 100.0000 mg | ORAL_TABLET | Freq: Every day | ORAL | 0 refills | Status: DC

## 2015-10-31 NOTE — Telephone Encounter (Signed)
Losartan Potassium 100MG , and Tamulosin 0.4mg , 90 day supply, with 0 refill, sent to pharmacy.

## 2016-01-21 ENCOUNTER — Other Ambulatory Visit: Payer: Medicare Other | Admitting: Internal Medicine

## 2016-01-22 ENCOUNTER — Other Ambulatory Visit: Payer: Medicare Other | Admitting: Internal Medicine

## 2016-01-24 ENCOUNTER — Encounter: Payer: Medicare Other | Admitting: Internal Medicine

## 2016-02-02 ENCOUNTER — Other Ambulatory Visit: Payer: Self-pay | Admitting: Internal Medicine

## 2016-02-03 NOTE — Telephone Encounter (Signed)
Refill x 6 months 

## 2016-03-04 ENCOUNTER — Other Ambulatory Visit: Payer: Self-pay | Admitting: Internal Medicine

## 2016-03-04 ENCOUNTER — Other Ambulatory Visit: Payer: Medicare Other | Admitting: Internal Medicine

## 2016-03-04 DIAGNOSIS — I10 Essential (primary) hypertension: Secondary | ICD-10-CM

## 2016-03-04 DIAGNOSIS — Z125 Encounter for screening for malignant neoplasm of prostate: Secondary | ICD-10-CM

## 2016-03-04 DIAGNOSIS — Z0000 Encounter for general adult medical examination without abnormal findings: Secondary | ICD-10-CM

## 2016-03-04 LAB — COMPREHENSIVE METABOLIC PANEL
ALT: 12 U/L (ref 9–46)
AST: 15 U/L (ref 10–35)
Albumin: 4.2 g/dL (ref 3.6–5.1)
Alkaline Phosphatase: 61 U/L (ref 40–115)
BUN: 13 mg/dL (ref 7–25)
CO2: 27 mmol/L (ref 20–31)
Calcium: 9.5 mg/dL (ref 8.6–10.3)
Chloride: 106 mmol/L (ref 98–110)
Creat: 0.98 mg/dL (ref 0.70–1.18)
Glucose, Bld: 102 mg/dL — ABNORMAL HIGH (ref 65–99)
Potassium: 4.8 mmol/L (ref 3.5–5.3)
Sodium: 140 mmol/L (ref 135–146)
Total Bilirubin: 0.5 mg/dL (ref 0.2–1.2)
Total Protein: 6.8 g/dL (ref 6.1–8.1)

## 2016-03-04 LAB — LIPID PANEL
CHOL/HDL RATIO: 2.9 ratio (ref <5.0)
Cholesterol: 168 mg/dL (ref <200)
HDL: 57 mg/dL (ref >40)
LDL CALC: 104 mg/dL — AB (ref <100)
Triglycerides: 35 mg/dL (ref <150)
VLDL: 7 mg/dL (ref <30)

## 2016-03-04 LAB — CBC WITH DIFFERENTIAL/PLATELET
BASOS PCT: 1 %
Basophils Absolute: 32 cells/uL (ref 0–200)
Eosinophils Absolute: 64 cells/uL (ref 15–500)
Eosinophils Relative: 2 %
HCT: 40.8 % (ref 38.5–50.0)
Hemoglobin: 13.6 g/dL (ref 13.2–17.1)
Lymphocytes Relative: 32 %
Lymphs Abs: 1024 cells/uL (ref 850–3900)
MCH: 31.1 pg (ref 27.0–33.0)
MCHC: 33.3 g/dL (ref 32.0–36.0)
MCV: 93.4 fL (ref 80.0–100.0)
MONOS PCT: 8 %
MPV: 8.9 fL (ref 7.5–12.5)
Monocytes Absolute: 256 cells/uL (ref 200–950)
Neutro Abs: 1824 cells/uL (ref 1500–7800)
Neutrophils Relative %: 57 %
PLATELETS: 180 10*3/uL (ref 140–400)
RBC: 4.37 MIL/uL (ref 4.20–5.80)
RDW: 13.4 % (ref 11.0–15.0)
WBC: 3.2 10*3/uL — AB (ref 3.8–10.8)

## 2016-03-04 LAB — PSA: PSA: 0.4 ng/mL (ref <=4.0)

## 2016-03-06 ENCOUNTER — Encounter: Payer: Medicare Other | Admitting: Internal Medicine

## 2016-03-11 ENCOUNTER — Encounter: Payer: Self-pay | Admitting: Internal Medicine

## 2016-03-11 ENCOUNTER — Ambulatory Visit (INDEPENDENT_AMBULATORY_CARE_PROVIDER_SITE_OTHER): Payer: Medicare Other | Admitting: Internal Medicine

## 2016-03-11 VITALS — BP 130/80 | HR 78 | Temp 98.0°F | Ht 70.0 in | Wt 225.0 lb

## 2016-03-11 DIAGNOSIS — M7918 Myalgia, other site: Secondary | ICD-10-CM

## 2016-03-11 DIAGNOSIS — N400 Enlarged prostate without lower urinary tract symptoms: Secondary | ICD-10-CM | POA: Diagnosis not present

## 2016-03-11 DIAGNOSIS — F1021 Alcohol dependence, in remission: Secondary | ICD-10-CM | POA: Diagnosis not present

## 2016-03-11 DIAGNOSIS — Z0000 Encounter for general adult medical examination without abnormal findings: Secondary | ICD-10-CM

## 2016-03-11 DIAGNOSIS — M791 Myalgia: Secondary | ICD-10-CM

## 2016-03-11 DIAGNOSIS — I10 Essential (primary) hypertension: Secondary | ICD-10-CM | POA: Diagnosis not present

## 2016-03-11 DIAGNOSIS — Q819 Epidermolysis bullosa, unspecified: Secondary | ICD-10-CM

## 2016-03-11 DIAGNOSIS — D72819 Decreased white blood cell count, unspecified: Secondary | ICD-10-CM

## 2016-03-11 LAB — POCT URINALYSIS DIPSTICK
Bilirubin, UA: NEGATIVE
Glucose, UA: NEGATIVE
Ketones, UA: NEGATIVE
Leukocytes, UA: NEGATIVE
Nitrite, UA: NEGATIVE
PROTEIN UA: NEGATIVE
RBC UA: NEGATIVE
Spec Grav, UA: 1.01
UROBILINOGEN UA: NEGATIVE
pH, UA: 6.5
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
## 2016-03-12 NOTE — Patient Instructions (Signed)
It was a pleasure to see you today. Continue same medications and return in one year.

## 2016-05-20 ENCOUNTER — Other Ambulatory Visit: Payer: Self-pay

## 2016-05-20 MED ORDER — TRAMADOL HCL 50 MG PO TABS: ORAL_TABLET | ORAL | 5 refills | Status: DC

## 2016-09-02 ENCOUNTER — Other Ambulatory Visit: Payer: Self-pay | Admitting: Internal Medicine

## 2016-11-27 ENCOUNTER — Other Ambulatory Visit: Payer: Self-pay | Admitting: Internal Medicine

## 2016-11-27 NOTE — Telephone Encounter (Signed)
Refill x 6 months 

## 2017-03-06 ENCOUNTER — Other Ambulatory Visit: Payer: Self-pay | Admitting: Internal Medicine

## 2017-05-19 ENCOUNTER — Other Ambulatory Visit: Payer: Self-pay | Admitting: Internal Medicine

## 2017-06-15 ENCOUNTER — Other Ambulatory Visit: Payer: Self-pay | Admitting: Internal Medicine

## 2017-06-15 NOTE — Telephone Encounter (Signed)
Refill x 6 months 

## 2017-07-06 ENCOUNTER — Encounter: Payer: Self-pay | Admitting: Internal Medicine

## 2017-07-06 ENCOUNTER — Ambulatory Visit (INDEPENDENT_AMBULATORY_CARE_PROVIDER_SITE_OTHER): Payer: Medicare Other | Admitting: Internal Medicine

## 2017-07-06 VITALS — BP 150/70 | HR 80 | Temp 98.2°F | Ht 70.0 in | Wt 219.0 lb

## 2017-07-06 DIAGNOSIS — Q819 Epidermolysis bullosa, unspecified: Secondary | ICD-10-CM | POA: Diagnosis not present

## 2017-07-06 DIAGNOSIS — L97923 Non-pressure chronic ulcer of unspecified part of left lower leg with necrosis of muscle: Secondary | ICD-10-CM | POA: Diagnosis not present

## 2017-07-06 DIAGNOSIS — M5431 Sciatica, right side: Secondary | ICD-10-CM | POA: Diagnosis not present

## 2017-07-06 MED ORDER — HYDROCODONE-ACETAMINOPHEN 5-325 MG PO TABS: 1.0000 | ORAL_TABLET | Freq: Four times a day (QID) | ORAL | 0 refills | Status: DC | PRN

## 2017-07-06 MED ORDER — PREDNISONE 10 MG PO TABS: ORAL_TABLET | ORAL | 1 refills | Status: DC

## 2017-07-06 NOTE — Progress Notes (Signed)
   Subjective:    Patient ID: Carlos White, male    DOB: [birth year], 77 y.o.   MRN: [id number]  HPI 77 year old white male with history of epidermolysis bullosa for many years.  He is usually able to handle bullous lesions by wrapping them and taking care of them in the clean fashion.  About 3 months he has had 2 nonhealing ulcers on his left lateral leg.  He is concerned he cannot get them to heal up.  Another issue today is pain in his right buttock extending down to his right knee.  This is been present for about a week.  He does play golf and works out at [employer] some.  He is not been able to get comfortable despite taking over-the-counter anti-inflammatory medications and tramadol which he has on hand for some chronic mild back pain.  He has no numbness in the right leg although he feels that it is a bit weak.    Review of Systems see above     Objective:   Physical Exam He has 2 lesions left lateral leg that are approximately 2.5 cm in diameter each.  These are nonhealing.  They are not draining.  He is keeping them wrapped.  Straight leg raising at 90 degrees on the right is positive.  His muscle strength is normal in the right leg.  Deep tendon 1+ in the left knee reflex absent in the right knee       Assessment & Plan:  Epidermal lysis bullosa with nonhealing leg ulcers x2 left lateral leg-he will be referred to wound care center for evaluation and treatment.  Sciatica-he will take a tapering course of prednisone going from 60 mg to 0 mg over 7 days.  Of course this will interfere with healing of his leg ulcers but he is in a considerable amount of pain.  Given him a small quantity of hydrocodone APAP 5/325 to take sparingly every 6 hours as needed pain #20 with no refill.  He has 1 refill on prednisone.  He is not getting better in a couple of weeks we will need to consider physical therapy.

## 2017-07-06 NOTE — Patient Instructions (Signed)
Referral to wound care center.  Take prednisone and tapering course as directed for sciatica.  If not better in 7 days repeat course.  Hydrocodone APAP 5/325 every 6 hours for sciatica pain.  Stop tramadol.

## 2017-07-10 ENCOUNTER — Telehealth: Payer: Self-pay | Admitting: Internal Medicine

## 2017-07-10 NOTE — Telephone Encounter (Signed)
Patient states that he had the one night left on Prednisone and he didn't have quite a good night.  Wants to know if you want to repeat another dose pak?  It wasn't a terrible night;  However, it's not quite where he needs to be just yet.  He can either come back to see you for another OV, or he'll go on your recommendation.    Please advise.    Thank you.

## 2017-07-10 NOTE — Telephone Encounter (Signed)
This dosepack was prescibed June 24 WITH a refill so it seems he should just ask pharmacy for refill.

## 2017-07-13 ENCOUNTER — Encounter: Payer: Self-pay | Admitting: Internal Medicine

## 2017-07-13 ENCOUNTER — Telehealth: Payer: Self-pay | Admitting: Internal Medicine

## 2017-07-13 MED ORDER — HYDROCODONE-ACETAMINOPHEN 5-325 MG PO TABS: 1.0000 | ORAL_TABLET | Freq: Four times a day (QID) | ORAL | 0 refills | Status: DC | PRN

## 2017-07-13 NOTE — Telephone Encounter (Signed)
Refilled Norco 5/325 #20 as requested.

## 2017-07-13 NOTE — Telephone Encounter (Signed)
Refilled as requested  

## 2017-07-13 NOTE — Telephone Encounter (Signed)
Patient is refilling the Prednisone.  However, he came by the office today and states that he only has a few more of Norco left and he is afraid that during the next few days that he may new some more pain killers because he is still hobbling around.    He wants to know if you will [facility]?    Thank you.

## 2017-07-13 NOTE — Telephone Encounter (Signed)
Can you please do the refill for the Norco for the patient?  I am unable to assist with that.    Thank you Dr. Renold Genta.

## 2017-07-13 NOTE — Telephone Encounter (Signed)
I see the Norco has been sent to the pharmacy for the patient Dr. Renold Genta.  I'm sorry.    Thanks for sending.  I'm calling the patient to let him know.

## 2017-08-16 ENCOUNTER — Other Ambulatory Visit: Payer: Self-pay | Admitting: Internal Medicine

## 2017-09-17 ENCOUNTER — Other Ambulatory Visit: Payer: Self-pay | Admitting: Internal Medicine

## 2017-09-30 ENCOUNTER — Encounter (HOSPITAL_BASED_OUTPATIENT_CLINIC_OR_DEPARTMENT_OTHER): Payer: Medicare Other | Attending: Physician Assistant

## 2017-09-30 DIAGNOSIS — L97322 Non-pressure chronic ulcer of left ankle with fat layer exposed: Secondary | ICD-10-CM | POA: Diagnosis present

## 2017-09-30 DIAGNOSIS — I10 Essential (primary) hypertension: Secondary | ICD-10-CM | POA: Diagnosis not present

## 2017-09-30 DIAGNOSIS — L97822 Non-pressure chronic ulcer of other part of left lower leg with fat layer exposed: Secondary | ICD-10-CM | POA: Insufficient documentation

## 2017-09-30 DIAGNOSIS — Q818 Other epidermolysis bullosa: Secondary | ICD-10-CM | POA: Diagnosis not present

## 2017-09-30 DIAGNOSIS — I872 Venous insufficiency (chronic) (peripheral): Secondary | ICD-10-CM | POA: Diagnosis not present

## 2017-09-30 DIAGNOSIS — L97812 Non-pressure chronic ulcer of other part of right lower leg with fat layer exposed: Secondary | ICD-10-CM | POA: Diagnosis not present

## 2017-09-30 DIAGNOSIS — Z87891 Personal history of nicotine dependence: Secondary | ICD-10-CM | POA: Diagnosis not present

## 2017-10-07 ENCOUNTER — Other Ambulatory Visit: Payer: Self-pay | Admitting: Internal Medicine

## 2017-10-07 DIAGNOSIS — L97923 Non-pressure chronic ulcer of unspecified part of left lower leg with necrosis of muscle: Secondary | ICD-10-CM

## 2017-10-07 DIAGNOSIS — L97322 Non-pressure chronic ulcer of left ankle with fat layer exposed: Secondary | ICD-10-CM | POA: Diagnosis not present

## 2017-10-14 ENCOUNTER — Encounter (HOSPITAL_BASED_OUTPATIENT_CLINIC_OR_DEPARTMENT_OTHER): Payer: Medicare Other | Attending: Physician Assistant

## 2017-10-14 ENCOUNTER — Ambulatory Visit (HOSPITAL_COMMUNITY)
Admission: RE | Admit: 2017-10-14 | Discharge: 2017-10-14 | Disposition: A | Discharge status: Home / Self Care | Payer: Medicare Other | Source: Ambulatory Visit | Attending: Internal Medicine | Admitting: Internal Medicine

## 2017-10-14 DIAGNOSIS — J449 Chronic obstructive pulmonary disease, unspecified: Secondary | ICD-10-CM | POA: Insufficient documentation

## 2017-10-14 DIAGNOSIS — I872 Venous insufficiency (chronic) (peripheral): Secondary | ICD-10-CM | POA: Diagnosis not present

## 2017-10-14 DIAGNOSIS — L97923 Non-pressure chronic ulcer of unspecified part of left lower leg with necrosis of muscle: Secondary | ICD-10-CM | POA: Diagnosis present

## 2017-10-14 DIAGNOSIS — L97812 Non-pressure chronic ulcer of other part of right lower leg with fat layer exposed: Secondary | ICD-10-CM | POA: Insufficient documentation

## 2017-10-14 DIAGNOSIS — I10 Essential (primary) hypertension: Secondary | ICD-10-CM | POA: Diagnosis not present

## 2017-10-14 DIAGNOSIS — L97822 Non-pressure chronic ulcer of other part of left lower leg with fat layer exposed: Secondary | ICD-10-CM | POA: Insufficient documentation

## 2017-10-14 DIAGNOSIS — Z87891 Personal history of nicotine dependence: Secondary | ICD-10-CM | POA: Diagnosis not present

## 2017-10-14 DIAGNOSIS — L97322 Non-pressure chronic ulcer of left ankle with fat layer exposed: Secondary | ICD-10-CM | POA: Diagnosis present

## 2017-10-23 DIAGNOSIS — L97322 Non-pressure chronic ulcer of left ankle with fat layer exposed: Secondary | ICD-10-CM | POA: Diagnosis not present

## 2017-10-28 DIAGNOSIS — L97322 Non-pressure chronic ulcer of left ankle with fat layer exposed: Secondary | ICD-10-CM | POA: Diagnosis not present

## 2017-10-29 ENCOUNTER — Other Ambulatory Visit: Payer: Self-pay | Admitting: Internal Medicine

## 2017-11-04 DIAGNOSIS — L97322 Non-pressure chronic ulcer of left ankle with fat layer exposed: Secondary | ICD-10-CM | POA: Diagnosis not present

## 2017-11-08 ENCOUNTER — Other Ambulatory Visit: Payer: Self-pay | Admitting: Internal Medicine

## 2017-11-11 ENCOUNTER — Other Ambulatory Visit (HOSPITAL_COMMUNITY)
Admission: RE | Admit: 2017-11-11 | Discharge: 2017-11-11 | Disposition: A | Discharge status: Home / Self Care | Payer: Medicare Other | Source: Other Acute Inpatient Hospital | Attending: Physician Assistant | Admitting: Physician Assistant

## 2017-11-11 DIAGNOSIS — L97923 Non-pressure chronic ulcer of unspecified part of left lower leg with necrosis of muscle: Secondary | ICD-10-CM | POA: Diagnosis present

## 2017-11-11 DIAGNOSIS — L97322 Non-pressure chronic ulcer of left ankle with fat layer exposed: Secondary | ICD-10-CM | POA: Diagnosis not present

## 2017-11-15 LAB — AEROBIC CULTURE  (SUPERFICIAL SPECIMEN)

## 2017-11-15 LAB — AEROBIC CULTURE W GRAM STAIN (SUPERFICIAL SPECIMEN)

## 2017-11-18 ENCOUNTER — Encounter (HOSPITAL_BASED_OUTPATIENT_CLINIC_OR_DEPARTMENT_OTHER): Payer: Medicare Other | Attending: Internal Medicine

## 2017-11-18 DIAGNOSIS — L97322 Non-pressure chronic ulcer of left ankle with fat layer exposed: Secondary | ICD-10-CM | POA: Diagnosis present

## 2017-11-18 DIAGNOSIS — Z87891 Personal history of nicotine dependence: Secondary | ICD-10-CM | POA: Diagnosis not present

## 2017-11-18 DIAGNOSIS — L97812 Non-pressure chronic ulcer of other part of right lower leg with fat layer exposed: Secondary | ICD-10-CM | POA: Diagnosis not present

## 2017-11-18 DIAGNOSIS — L97212 Non-pressure chronic ulcer of right calf with fat layer exposed: Secondary | ICD-10-CM | POA: Insufficient documentation

## 2017-11-18 DIAGNOSIS — I872 Venous insufficiency (chronic) (peripheral): Secondary | ICD-10-CM | POA: Diagnosis not present

## 2017-11-18 DIAGNOSIS — L97822 Non-pressure chronic ulcer of other part of left lower leg with fat layer exposed: Secondary | ICD-10-CM | POA: Diagnosis not present

## 2017-11-18 DIAGNOSIS — I10 Essential (primary) hypertension: Secondary | ICD-10-CM | POA: Insufficient documentation

## 2017-11-18 DIAGNOSIS — Q818 Other epidermolysis bullosa: Secondary | ICD-10-CM | POA: Diagnosis not present

## 2017-11-25 DIAGNOSIS — L97322 Non-pressure chronic ulcer of left ankle with fat layer exposed: Secondary | ICD-10-CM | POA: Diagnosis not present

## 2017-11-30 ENCOUNTER — Other Ambulatory Visit: Payer: Self-pay | Admitting: Internal Medicine

## 2017-12-02 DIAGNOSIS — L97322 Non-pressure chronic ulcer of left ankle with fat layer exposed: Secondary | ICD-10-CM | POA: Diagnosis not present

## 2017-12-09 DIAGNOSIS — L97322 Non-pressure chronic ulcer of left ankle with fat layer exposed: Secondary | ICD-10-CM | POA: Diagnosis not present

## 2017-12-16 ENCOUNTER — Encounter (HOSPITAL_COMMUNITY): Payer: Medicare Other

## 2017-12-18 ENCOUNTER — Other Ambulatory Visit (HOSPITAL_BASED_OUTPATIENT_CLINIC_OR_DEPARTMENT_OTHER): Payer: Self-pay | Admitting: Internal Medicine

## 2017-12-18 ENCOUNTER — Ambulatory Visit (HOSPITAL_COMMUNITY)
Admission: RE | Admit: 2017-12-18 | Discharge: 2017-12-18 | Disposition: A | Discharge status: Home / Self Care | Payer: Medicare Other | Source: Ambulatory Visit | Attending: Internal Medicine | Admitting: Internal Medicine

## 2017-12-18 DIAGNOSIS — L97322 Non-pressure chronic ulcer of left ankle with fat layer exposed: Secondary | ICD-10-CM

## 2017-12-23 ENCOUNTER — Encounter (HOSPITAL_BASED_OUTPATIENT_CLINIC_OR_DEPARTMENT_OTHER): Payer: Medicare Other | Attending: Physician Assistant

## 2017-12-23 DIAGNOSIS — I872 Venous insufficiency (chronic) (peripheral): Secondary | ICD-10-CM | POA: Diagnosis not present

## 2017-12-23 DIAGNOSIS — L97812 Non-pressure chronic ulcer of other part of right lower leg with fat layer exposed: Secondary | ICD-10-CM | POA: Diagnosis not present

## 2017-12-23 DIAGNOSIS — J449 Chronic obstructive pulmonary disease, unspecified: Secondary | ICD-10-CM | POA: Diagnosis not present

## 2017-12-23 DIAGNOSIS — L97822 Non-pressure chronic ulcer of other part of left lower leg with fat layer exposed: Secondary | ICD-10-CM | POA: Insufficient documentation

## 2017-12-23 DIAGNOSIS — I10 Essential (primary) hypertension: Secondary | ICD-10-CM | POA: Diagnosis not present

## 2017-12-23 DIAGNOSIS — L97322 Non-pressure chronic ulcer of left ankle with fat layer exposed: Secondary | ICD-10-CM | POA: Diagnosis present

## 2017-12-30 DIAGNOSIS — L97322 Non-pressure chronic ulcer of left ankle with fat layer exposed: Secondary | ICD-10-CM | POA: Diagnosis not present

## 2017-12-31 ENCOUNTER — Other Ambulatory Visit: Payer: Medicare Other | Admitting: Internal Medicine

## 2018-01-07 ENCOUNTER — Telehealth: Payer: Self-pay | Admitting: Internal Medicine

## 2018-01-07 ENCOUNTER — Encounter: Payer: Medicare Other | Admitting: Internal Medicine

## 2018-01-07 ENCOUNTER — Encounter: Payer: Self-pay | Admitting: Internal Medicine

## 2018-01-07 DIAGNOSIS — L97322 Non-pressure chronic ulcer of left ankle with fat layer exposed: Secondary | ICD-10-CM | POA: Diagnosis not present

## 2018-01-07 NOTE — Telephone Encounter (Signed)
Patient was scheduled for his yearly CPE today, 01/07/18.  He did not show up for his labs on 12/19.  Instead his wife came in thinking it was appointment for labs.  Explained that it was her husbands appointment for fasting labs.  Wife was very upset and was very adamant that this was not his appointment, but hers.  Explained that she had her CPE in January, 2019 and was not due for her CPE until January 2020 because insurance would not pay prior to that date.  Husband, however was due and this was his appointment.  She was very upset and said that he had already had coffee this morning and could not have labs.  I offered to R/S his labs for Thursday or Friday (12/19 or 12/20) and she did not R/S for him, she just left very upset.  Patient did not call to R/S.  And, he did not show today for his CPE (thinking he would have his fasting CPE Labs at the time of his appointment today).

## 2018-01-07 NOTE — Telephone Encounter (Signed)
Pt has not had CPE since Feb 2018. Was scheduled for CPE today but wife thought it was her appt. She does not qualify for CPE until after February 10, 2018. Have mailed pt and wife proof of last CPEs. They will need to schedule in the near future. Hers has to be after January 29,2020. His can be anytime.

## 2018-01-15 ENCOUNTER — Encounter (HOSPITAL_BASED_OUTPATIENT_CLINIC_OR_DEPARTMENT_OTHER): Payer: Medicare Other | Attending: Internal Medicine

## 2018-01-15 DIAGNOSIS — J449 Chronic obstructive pulmonary disease, unspecified: Secondary | ICD-10-CM | POA: Insufficient documentation

## 2018-01-15 DIAGNOSIS — I872 Venous insufficiency (chronic) (peripheral): Secondary | ICD-10-CM | POA: Diagnosis not present

## 2018-01-15 DIAGNOSIS — L97422 Non-pressure chronic ulcer of left heel and midfoot with fat layer exposed: Secondary | ICD-10-CM | POA: Diagnosis not present

## 2018-01-15 DIAGNOSIS — L97322 Non-pressure chronic ulcer of left ankle with fat layer exposed: Secondary | ICD-10-CM | POA: Diagnosis not present

## 2018-01-15 DIAGNOSIS — L97812 Non-pressure chronic ulcer of other part of right lower leg with fat layer exposed: Secondary | ICD-10-CM | POA: Insufficient documentation

## 2018-01-15 DIAGNOSIS — Z87891 Personal history of nicotine dependence: Secondary | ICD-10-CM | POA: Insufficient documentation

## 2018-01-15 DIAGNOSIS — I10 Essential (primary) hypertension: Secondary | ICD-10-CM | POA: Insufficient documentation

## 2018-01-15 DIAGNOSIS — L97822 Non-pressure chronic ulcer of other part of left lower leg with fat layer exposed: Secondary | ICD-10-CM | POA: Diagnosis not present

## 2018-01-20 DIAGNOSIS — L97322 Non-pressure chronic ulcer of left ankle with fat layer exposed: Secondary | ICD-10-CM | POA: Diagnosis not present

## 2018-01-27 DIAGNOSIS — L97322 Non-pressure chronic ulcer of left ankle with fat layer exposed: Secondary | ICD-10-CM | POA: Diagnosis not present

## 2018-02-03 DIAGNOSIS — L97322 Non-pressure chronic ulcer of left ankle with fat layer exposed: Secondary | ICD-10-CM | POA: Diagnosis not present

## 2018-02-10 DIAGNOSIS — L97322 Non-pressure chronic ulcer of left ankle with fat layer exposed: Secondary | ICD-10-CM | POA: Diagnosis not present

## 2018-02-17 ENCOUNTER — Encounter (HOSPITAL_BASED_OUTPATIENT_CLINIC_OR_DEPARTMENT_OTHER): Payer: Medicare Other | Attending: Internal Medicine

## 2018-02-17 DIAGNOSIS — I872 Venous insufficiency (chronic) (peripheral): Secondary | ICD-10-CM | POA: Insufficient documentation

## 2018-02-17 DIAGNOSIS — L97822 Non-pressure chronic ulcer of other part of left lower leg with fat layer exposed: Secondary | ICD-10-CM | POA: Diagnosis not present

## 2018-02-17 DIAGNOSIS — J449 Chronic obstructive pulmonary disease, unspecified: Secondary | ICD-10-CM | POA: Insufficient documentation

## 2018-02-17 DIAGNOSIS — L97812 Non-pressure chronic ulcer of other part of right lower leg with fat layer exposed: Secondary | ICD-10-CM | POA: Insufficient documentation

## 2018-02-17 DIAGNOSIS — I10 Essential (primary) hypertension: Secondary | ICD-10-CM | POA: Diagnosis not present

## 2018-02-17 DIAGNOSIS — L97322 Non-pressure chronic ulcer of left ankle with fat layer exposed: Secondary | ICD-10-CM | POA: Diagnosis not present

## 2018-02-17 DIAGNOSIS — L97422 Non-pressure chronic ulcer of left heel and midfoot with fat layer exposed: Secondary | ICD-10-CM | POA: Insufficient documentation

## 2018-02-17 DIAGNOSIS — Z87891 Personal history of nicotine dependence: Secondary | ICD-10-CM | POA: Diagnosis not present

## 2018-02-18 ENCOUNTER — Other Ambulatory Visit: Payer: Medicare Other | Admitting: Internal Medicine

## 2018-02-18 DIAGNOSIS — D72819 Decreased white blood cell count, unspecified: Secondary | ICD-10-CM

## 2018-02-18 DIAGNOSIS — I10 Essential (primary) hypertension: Secondary | ICD-10-CM

## 2018-02-18 DIAGNOSIS — N400 Enlarged prostate without lower urinary tract symptoms: Secondary | ICD-10-CM

## 2018-02-18 DIAGNOSIS — M7918 Myalgia, other site: Secondary | ICD-10-CM

## 2018-02-18 DIAGNOSIS — Z0000 Encounter for general adult medical examination without abnormal findings: Secondary | ICD-10-CM

## 2018-02-18 DIAGNOSIS — Q819 Epidermolysis bullosa, unspecified: Secondary | ICD-10-CM

## 2018-02-18 LAB — COMPLETE METABOLIC PANEL WITH GFR
AG Ratio: 1.4 (calc) (ref 1.0–2.5)
ALBUMIN MSPROF: 3.7 g/dL (ref 3.6–5.1)
ALT: 9 U/L (ref 9–46)
AST: 13 U/L (ref 10–35)
Alkaline phosphatase (APISO): 78 U/L (ref 35–144)
BILIRUBIN TOTAL: 0.5 mg/dL (ref 0.2–1.2)
BUN: 14 mg/dL (ref 7–25)
CALCIUM: 9.4 mg/dL (ref 8.6–10.3)
CHLORIDE: 105 mmol/L (ref 98–110)
CO2: 30 mmol/L (ref 20–32)
CREATININE: 0.94 mg/dL (ref 0.70–1.18)
GFR, EST AFRICAN AMERICAN: 90 mL/min/{1.73_m2} (ref >=60)
GFR, Est Non African American: 77 mL/min/{1.73_m2} (ref >=60)
GLUCOSE: 90 mg/dL (ref 65–99)
Globulin: 2.6 g/dL (calc) (ref 1.9–3.7)
Potassium: 5.5 mmol/L — ABNORMAL HIGH (ref 3.5–5.3)
Sodium: 140 mmol/L (ref 135–146)
TOTAL PROTEIN: 6.3 g/dL (ref 6.1–8.1)

## 2018-02-18 LAB — CBC WITH DIFFERENTIAL/PLATELET
ABSOLUTE MONOCYTES: 328 {cells}/uL (ref 200–950)
Basophils Absolute: 28 cells/uL (ref 0–200)
Basophils Relative: 0.7 %
EOS ABS: 188 {cells}/uL (ref 15–500)
Eosinophils Relative: 4.7 %
HCT: 33.9 % — ABNORMAL LOW (ref 38.5–50.0)
HEMOGLOBIN: 11.2 g/dL — AB (ref 13.2–17.1)
Lymphs Abs: 1004 cells/uL (ref 850–3900)
MCH: 30.4 pg (ref 27.0–33.0)
MCHC: 33 g/dL (ref 32.0–36.0)
MCV: 92.1 fL (ref 80.0–100.0)
MONOS PCT: 8.2 %
MPV: 9.1 fL (ref 7.5–12.5)
NEUTROS ABS: 2452 {cells}/uL (ref 1500–7800)
Neutrophils Relative %: 61.3 %
Platelets: 209 10*3/uL (ref 140–400)
RBC: 3.68 10*6/uL — AB (ref 4.20–5.80)
RDW: 11.8 % (ref 11.0–15.0)
Total Lymphocyte: 25.1 %
WBC: 4 10*3/uL (ref 3.8–10.8)

## 2018-02-18 LAB — PSA: PSA: 0.6 ng/mL (ref <=4.0)

## 2018-02-18 LAB — LIPID PANEL
CHOL/HDL RATIO: 2.9 (calc) (ref <5.0)
Cholesterol: 130 mg/dL (ref <200)
HDL: 45 mg/dL (ref >=40)
LDL CHOLESTEROL (CALC): 76 mg/dL
NON-HDL CHOLESTEROL (CALC): 85 mg/dL (ref <130)
TRIGLYCERIDES: 30 mg/dL (ref <150)

## 2018-02-23 ENCOUNTER — Encounter: Payer: Self-pay | Admitting: Internal Medicine

## 2018-02-23 ENCOUNTER — Ambulatory Visit (INDEPENDENT_AMBULATORY_CARE_PROVIDER_SITE_OTHER): Payer: Medicare Other | Admitting: Internal Medicine

## 2018-02-23 VITALS — BP 140/60 | HR 62 | Ht 70.0 in | Wt 218.0 lb

## 2018-02-23 DIAGNOSIS — M545 Low back pain: Secondary | ICD-10-CM | POA: Diagnosis not present

## 2018-02-23 DIAGNOSIS — Q819 Epidermolysis bullosa, unspecified: Secondary | ICD-10-CM | POA: Diagnosis not present

## 2018-02-23 DIAGNOSIS — F1021 Alcohol dependence, in remission: Secondary | ICD-10-CM

## 2018-02-23 DIAGNOSIS — N400 Enlarged prostate without lower urinary tract symptoms: Secondary | ICD-10-CM

## 2018-02-23 DIAGNOSIS — I10 Essential (primary) hypertension: Secondary | ICD-10-CM | POA: Diagnosis not present

## 2018-02-23 DIAGNOSIS — Z0000 Encounter for general adult medical examination without abnormal findings: Secondary | ICD-10-CM

## 2018-02-23 DIAGNOSIS — E875 Hyperkalemia: Secondary | ICD-10-CM | POA: Diagnosis not present

## 2018-02-23 DIAGNOSIS — D649 Anemia, unspecified: Secondary | ICD-10-CM

## 2018-02-23 DIAGNOSIS — E611 Iron deficiency: Secondary | ICD-10-CM | POA: Diagnosis not present

## 2018-02-23 DIAGNOSIS — D72819 Decreased white blood cell count, unspecified: Secondary | ICD-10-CM

## 2018-02-23 DIAGNOSIS — H903 Sensorineural hearing loss, bilateral: Secondary | ICD-10-CM

## 2018-02-23 DIAGNOSIS — G8929 Other chronic pain: Secondary | ICD-10-CM

## 2018-02-23 LAB — POCT URINALYSIS DIPSTICK
APPEARANCE: NEGATIVE
Bilirubin, UA: NEGATIVE
Blood, UA: NEGATIVE
GLUCOSE UA: NEGATIVE
Ketones, UA: NEGATIVE
LEUKOCYTES UA: NEGATIVE
Nitrite, UA: NEGATIVE
Odor: NEGATIVE
Protein, UA: NEGATIVE
Spec Grav, UA: 1.01 (ref 1.010–1.025)
Urobilinogen, UA: 0.2 E.U./dL
pH, UA: 6 (ref 5.0–8.0)
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
## 2018-02-24 LAB — RETICULOCYTES
ABS Retic: 45760 cells/uL (ref 25000–9000)
Retic Ct Pct: 1.3 %

## 2018-02-24 LAB — POTASSIUM: POTASSIUM: 4.1 mmol/L (ref 3.5–5.3)

## 2018-02-24 LAB — IRON, TOTAL/TOTAL IRON BINDING CAP
%SAT: 20 % (calc) (ref 20–48)
IRON: 50 ug/dL (ref 50–180)
TIBC: 247 ug/dL — AB (ref 250–425)

## 2018-02-24 LAB — VITAMIN B12: Vitamin B-12: 456 pg/mL (ref 200–1100)

## 2018-02-25 ENCOUNTER — Other Ambulatory Visit: Payer: Self-pay | Admitting: Internal Medicine

## 2018-03-02 ENCOUNTER — Telehealth: Payer: Self-pay | Admitting: Internal Medicine

## 2018-03-02 MED ORDER — DOXYCYCLINE HYCLATE 100 MG PO TABS: 100.0000 mg | ORAL_TABLET | Freq: Two times a day (BID) | ORAL | 0 refills | Status: DC

## 2018-03-02 NOTE — Telephone Encounter (Signed)
Please call in Doxycycline 100 mg twice daily x 10 days

## 2018-03-02 NOTE — Telephone Encounter (Signed)
Patient states that you mentioned you heard something in his chest and was going to call in an antibiotic to [facility] for him.  States he hasn't received a call from [facility] stating it was ready.  I don't see an antibiotic on his snapshot.    Pharmacy:  [facility]  Thank you.

## 2018-03-03 DIAGNOSIS — L97322 Non-pressure chronic ulcer of left ankle with fat layer exposed: Secondary | ICD-10-CM | POA: Diagnosis not present

## 2018-03-10 ENCOUNTER — Other Ambulatory Visit (HOSPITAL_COMMUNITY)
Admission: RE | Admit: 2018-03-10 | Discharge: 2018-03-10 | Disposition: A | Discharge status: Home / Self Care | Payer: Medicare Other | Source: Other Acute Inpatient Hospital | Attending: Physician Assistant | Admitting: Physician Assistant

## 2018-03-10 DIAGNOSIS — L97322 Non-pressure chronic ulcer of left ankle with fat layer exposed: Secondary | ICD-10-CM | POA: Diagnosis not present

## 2018-03-10 DIAGNOSIS — L97822 Non-pressure chronic ulcer of other part of left lower leg with fat layer exposed: Secondary | ICD-10-CM | POA: Diagnosis present

## 2018-03-13 LAB — AEROBIC CULTURE W GRAM STAIN (SUPERFICIAL SPECIMEN)

## 2018-03-13 LAB — AEROBIC CULTURE  (SUPERFICIAL SPECIMEN)

## 2018-03-13 NOTE — Patient Instructions (Signed)
Take B12 and iron supplements and follow-up in 6 weeks.  Continue other medications as previously prescribed

## 2018-03-17 ENCOUNTER — Encounter (HOSPITAL_BASED_OUTPATIENT_CLINIC_OR_DEPARTMENT_OTHER): Payer: Medicare Other | Attending: Internal Medicine

## 2018-03-17 ENCOUNTER — Encounter (HOSPITAL_BASED_OUTPATIENT_CLINIC_OR_DEPARTMENT_OTHER): Payer: Self-pay

## 2018-03-17 DIAGNOSIS — Q818 Other epidermolysis bullosa: Secondary | ICD-10-CM | POA: Diagnosis not present

## 2018-03-17 DIAGNOSIS — Z79899 Other long term (current) drug therapy: Secondary | ICD-10-CM | POA: Diagnosis not present

## 2018-03-17 DIAGNOSIS — I10 Essential (primary) hypertension: Secondary | ICD-10-CM | POA: Insufficient documentation

## 2018-03-17 DIAGNOSIS — B965 Pseudomonas (aeruginosa) (mallei) (pseudomallei) as the cause of diseases classified elsewhere: Secondary | ICD-10-CM | POA: Diagnosis not present

## 2018-03-17 DIAGNOSIS — L97822 Non-pressure chronic ulcer of other part of left lower leg with fat layer exposed: Secondary | ICD-10-CM | POA: Insufficient documentation

## 2018-03-17 DIAGNOSIS — I872 Venous insufficiency (chronic) (peripheral): Secondary | ICD-10-CM | POA: Diagnosis not present

## 2018-03-17 DIAGNOSIS — L97322 Non-pressure chronic ulcer of left ankle with fat layer exposed: Secondary | ICD-10-CM | POA: Insufficient documentation

## 2018-03-17 DIAGNOSIS — L97812 Non-pressure chronic ulcer of other part of right lower leg with fat layer exposed: Secondary | ICD-10-CM | POA: Insufficient documentation

## 2018-03-17 DIAGNOSIS — B951 Streptococcus, group B, as the cause of diseases classified elsewhere: Secondary | ICD-10-CM | POA: Diagnosis not present

## 2018-03-24 DIAGNOSIS — L97322 Non-pressure chronic ulcer of left ankle with fat layer exposed: Secondary | ICD-10-CM | POA: Diagnosis not present

## 2018-03-31 DIAGNOSIS — L97322 Non-pressure chronic ulcer of left ankle with fat layer exposed: Secondary | ICD-10-CM | POA: Diagnosis not present

## 2018-04-14 ENCOUNTER — Other Ambulatory Visit: Payer: Self-pay

## 2018-04-14 ENCOUNTER — Encounter (HOSPITAL_BASED_OUTPATIENT_CLINIC_OR_DEPARTMENT_OTHER): Payer: Medicare Other | Attending: Physician Assistant

## 2018-04-14 DIAGNOSIS — L97822 Non-pressure chronic ulcer of other part of left lower leg with fat layer exposed: Secondary | ICD-10-CM | POA: Diagnosis not present

## 2018-04-14 DIAGNOSIS — Z87891 Personal history of nicotine dependence: Secondary | ICD-10-CM | POA: Insufficient documentation

## 2018-04-14 DIAGNOSIS — L97812 Non-pressure chronic ulcer of other part of right lower leg with fat layer exposed: Secondary | ICD-10-CM | POA: Insufficient documentation

## 2018-04-14 DIAGNOSIS — L97322 Non-pressure chronic ulcer of left ankle with fat layer exposed: Secondary | ICD-10-CM | POA: Diagnosis not present

## 2018-04-14 DIAGNOSIS — I872 Venous insufficiency (chronic) (peripheral): Secondary | ICD-10-CM | POA: Diagnosis not present

## 2018-04-14 DIAGNOSIS — I10 Essential (primary) hypertension: Secondary | ICD-10-CM | POA: Diagnosis not present

## 2018-04-14 DIAGNOSIS — J449 Chronic obstructive pulmonary disease, unspecified: Secondary | ICD-10-CM | POA: Insufficient documentation

## 2018-04-28 DIAGNOSIS — L97322 Non-pressure chronic ulcer of left ankle with fat layer exposed: Secondary | ICD-10-CM | POA: Diagnosis not present

## 2018-05-12 DIAGNOSIS — L97322 Non-pressure chronic ulcer of left ankle with fat layer exposed: Secondary | ICD-10-CM | POA: Diagnosis not present

## 2018-05-26 ENCOUNTER — Encounter (HOSPITAL_BASED_OUTPATIENT_CLINIC_OR_DEPARTMENT_OTHER): Payer: Medicare Other | Attending: Physician Assistant

## 2018-05-26 DIAGNOSIS — L97322 Non-pressure chronic ulcer of left ankle with fat layer exposed: Secondary | ICD-10-CM | POA: Insufficient documentation

## 2018-05-26 DIAGNOSIS — L97822 Non-pressure chronic ulcer of other part of left lower leg with fat layer exposed: Secondary | ICD-10-CM | POA: Insufficient documentation

## 2018-05-26 DIAGNOSIS — I10 Essential (primary) hypertension: Secondary | ICD-10-CM | POA: Insufficient documentation

## 2018-05-26 DIAGNOSIS — L97812 Non-pressure chronic ulcer of other part of right lower leg with fat layer exposed: Secondary | ICD-10-CM | POA: Diagnosis not present

## 2018-05-26 DIAGNOSIS — J449 Chronic obstructive pulmonary disease, unspecified: Secondary | ICD-10-CM | POA: Insufficient documentation

## 2018-05-26 DIAGNOSIS — Z87891 Personal history of nicotine dependence: Secondary | ICD-10-CM | POA: Diagnosis not present

## 2018-05-26 DIAGNOSIS — I872 Venous insufficiency (chronic) (peripheral): Secondary | ICD-10-CM | POA: Diagnosis not present

## 2018-05-29 ENCOUNTER — Other Ambulatory Visit: Payer: Self-pay | Admitting: Internal Medicine

## 2018-06-14 ENCOUNTER — Other Ambulatory Visit: Payer: Self-pay | Admitting: Internal Medicine

## 2018-06-16 ENCOUNTER — Encounter (HOSPITAL_BASED_OUTPATIENT_CLINIC_OR_DEPARTMENT_OTHER): Payer: Medicare Other | Attending: Physician Assistant

## 2018-06-16 DIAGNOSIS — L97812 Non-pressure chronic ulcer of other part of right lower leg with fat layer exposed: Secondary | ICD-10-CM | POA: Insufficient documentation

## 2018-06-16 DIAGNOSIS — Q818 Other epidermolysis bullosa: Secondary | ICD-10-CM | POA: Diagnosis not present

## 2018-06-16 DIAGNOSIS — J449 Chronic obstructive pulmonary disease, unspecified: Secondary | ICD-10-CM | POA: Insufficient documentation

## 2018-06-16 DIAGNOSIS — I872 Venous insufficiency (chronic) (peripheral): Secondary | ICD-10-CM | POA: Insufficient documentation

## 2018-06-16 DIAGNOSIS — L97822 Non-pressure chronic ulcer of other part of left lower leg with fat layer exposed: Secondary | ICD-10-CM | POA: Insufficient documentation

## 2018-06-16 DIAGNOSIS — I10 Essential (primary) hypertension: Secondary | ICD-10-CM | POA: Insufficient documentation

## 2018-07-14 ENCOUNTER — Encounter (HOSPITAL_BASED_OUTPATIENT_CLINIC_OR_DEPARTMENT_OTHER): Payer: Medicare Other | Attending: Physician Assistant

## 2018-07-14 DIAGNOSIS — I872 Venous insufficiency (chronic) (peripheral): Secondary | ICD-10-CM | POA: Diagnosis not present

## 2018-07-14 DIAGNOSIS — L97812 Non-pressure chronic ulcer of other part of right lower leg with fat layer exposed: Secondary | ICD-10-CM | POA: Insufficient documentation

## 2018-07-14 DIAGNOSIS — Q818 Other epidermolysis bullosa: Secondary | ICD-10-CM | POA: Insufficient documentation

## 2018-07-14 DIAGNOSIS — L97822 Non-pressure chronic ulcer of other part of left lower leg with fat layer exposed: Secondary | ICD-10-CM | POA: Insufficient documentation

## 2018-07-14 DIAGNOSIS — I10 Essential (primary) hypertension: Secondary | ICD-10-CM | POA: Insufficient documentation

## 2018-07-14 DIAGNOSIS — J449 Chronic obstructive pulmonary disease, unspecified: Secondary | ICD-10-CM | POA: Diagnosis not present

## 2018-08-11 ENCOUNTER — Encounter: Payer: Self-pay | Admitting: Internal Medicine

## 2018-08-18 ENCOUNTER — Encounter (HOSPITAL_BASED_OUTPATIENT_CLINIC_OR_DEPARTMENT_OTHER): Payer: Medicare Other | Attending: Internal Medicine

## 2018-08-18 DIAGNOSIS — J449 Chronic obstructive pulmonary disease, unspecified: Secondary | ICD-10-CM | POA: Insufficient documentation

## 2018-08-18 DIAGNOSIS — I10 Essential (primary) hypertension: Secondary | ICD-10-CM | POA: Diagnosis not present

## 2018-08-18 DIAGNOSIS — I872 Venous insufficiency (chronic) (peripheral): Secondary | ICD-10-CM | POA: Insufficient documentation

## 2018-08-18 DIAGNOSIS — L97822 Non-pressure chronic ulcer of other part of left lower leg with fat layer exposed: Secondary | ICD-10-CM | POA: Insufficient documentation

## 2018-08-18 DIAGNOSIS — Z87891 Personal history of nicotine dependence: Secondary | ICD-10-CM | POA: Insufficient documentation

## 2018-08-18 DIAGNOSIS — L97812 Non-pressure chronic ulcer of other part of right lower leg with fat layer exposed: Secondary | ICD-10-CM | POA: Insufficient documentation

## 2018-08-29 ENCOUNTER — Other Ambulatory Visit: Payer: Self-pay | Admitting: Internal Medicine

## 2018-09-15 ENCOUNTER — Encounter (HOSPITAL_BASED_OUTPATIENT_CLINIC_OR_DEPARTMENT_OTHER): Payer: Medicare Other | Attending: Physician Assistant

## 2018-09-15 DIAGNOSIS — I10 Essential (primary) hypertension: Secondary | ICD-10-CM | POA: Insufficient documentation

## 2018-09-15 DIAGNOSIS — L97812 Non-pressure chronic ulcer of other part of right lower leg with fat layer exposed: Secondary | ICD-10-CM | POA: Diagnosis not present

## 2018-09-15 DIAGNOSIS — L97822 Non-pressure chronic ulcer of other part of left lower leg with fat layer exposed: Secondary | ICD-10-CM | POA: Insufficient documentation

## 2018-09-15 DIAGNOSIS — Z87891 Personal history of nicotine dependence: Secondary | ICD-10-CM | POA: Insufficient documentation

## 2018-09-15 DIAGNOSIS — J449 Chronic obstructive pulmonary disease, unspecified: Secondary | ICD-10-CM | POA: Diagnosis not present

## 2018-09-15 DIAGNOSIS — I872 Venous insufficiency (chronic) (peripheral): Secondary | ICD-10-CM | POA: Diagnosis not present

## 2018-09-15 DIAGNOSIS — Q818 Other epidermolysis bullosa: Secondary | ICD-10-CM | POA: Insufficient documentation

## 2018-09-17 ENCOUNTER — Other Ambulatory Visit: Payer: Self-pay

## 2018-09-18 ENCOUNTER — Other Ambulatory Visit: Payer: Self-pay | Admitting: Internal Medicine

## 2018-10-20 ENCOUNTER — Other Ambulatory Visit: Payer: Self-pay

## 2018-10-20 ENCOUNTER — Encounter (HOSPITAL_BASED_OUTPATIENT_CLINIC_OR_DEPARTMENT_OTHER): Payer: Medicare Other | Attending: Physician Assistant | Admitting: Physician Assistant

## 2018-10-20 DIAGNOSIS — I10 Essential (primary) hypertension: Secondary | ICD-10-CM | POA: Diagnosis not present

## 2018-10-20 DIAGNOSIS — L97812 Non-pressure chronic ulcer of other part of right lower leg with fat layer exposed: Secondary | ICD-10-CM | POA: Diagnosis not present

## 2018-10-20 DIAGNOSIS — J449 Chronic obstructive pulmonary disease, unspecified: Secondary | ICD-10-CM | POA: Diagnosis not present

## 2018-10-20 DIAGNOSIS — L97822 Non-pressure chronic ulcer of other part of left lower leg with fat layer exposed: Secondary | ICD-10-CM | POA: Insufficient documentation

## 2018-10-20 DIAGNOSIS — Q818 Other epidermolysis bullosa: Secondary | ICD-10-CM | POA: Insufficient documentation

## 2018-10-20 DIAGNOSIS — I872 Venous insufficiency (chronic) (peripheral): Secondary | ICD-10-CM | POA: Insufficient documentation

## 2018-10-20 DIAGNOSIS — Z87891 Personal history of nicotine dependence: Secondary | ICD-10-CM | POA: Diagnosis not present

## 2018-10-21 NOTE — Progress Notes (Addendum)
Carlos White, Carlos White ([id number]) Visit Report for 10/20/2018 Chief Complaint Document Details Patient Name: Date of Service: Carlos White, Carlos White 10/20/2018 12:30 PM Medical Record [id number] Patient Account Number: [id number] Date of Birth/Sex: Treating RN: Feb 20, 1940 (78 y.o. Carlos White Primary Care Provider: Tedra White Other Clinician: Referring Provider: Treating Provider/Extender:Carlos White, Carlos White in Treatment: 71 Information Obtained from: Patient Chief Complaint Bilateral LE Ulcers Electronic Signature(s) Signed: 10/20/2018 1:22:18 PM By: Worthy Keeler PA-C Entered By: Worthy Keeler on 10/20/2018 13:22:18 -------------------------------------------------------------------------------- HPI Details Patient Name: Date of Service: Carlos White 10/20/2018 12:30 PM Medical Record [id number] Patient Account Number: [id number] Date of Birth/Sex: Treating RN: 1940-02-11 (78 y.o. Carlos White Primary Care Provider: Tedra White Other Clinician: Referring Provider: Treating Provider/Extender:Carlos White, Carlos White in Treatment: 30 History of Present Illness HPI Description: 09/30/17 on evaluation today patient presents for initial evaluation and our clinic concerning issues that he has been having with his bilateral lower extremities. He states this has been going on for quite some time at least six months. Currently his regiment has been mainly cleaning the area with peroxide, applying the is foreign ointment, and wrapping the area with ABD pads and then an ace wrap loosely. He has dealt with issues of this nature he tells me for quite some time. He does have a history of having had a compound fracture of the left lower extremity which he thinks also makes this a much more difficult area for him to heal. He's previously been told that he had poor vascular flow but this was years ago at [facility] we do not have any of those records at  this time. He has a history of Epidermolysis Bullosa which was diagnosed around age 73 and he has been cared for at [facility] since that time. Subsequently he states this is hereditary and two of his children one male and one male also have this as well is one of his grandchildren that he is aware of. He has no evidence of infection necessarily at this point although he does have some necrotic tissue noted on the surface of the wound as far as the largest, left lateral lower extremity ulcer, is concerned. Overall I feel like all things considered he's been taking care of this very well. Obviously he has some fairly significant issues going on at this point in this regard. He does have a history otherwise of hypertension though for the most part other than the compound fracture of the left leg he seems to have been fairly healthy in my pinion. 10/07/17 on evaluation today patient actually appears to be doing better in regard to his bilateral lower extremity ulcers. With that being said he does still have some evidence of slough noted on the surface of the wounds I think the Iodoflex has been beneficial for him. His arterial studies are scheduled for October 2. With that being said I do believe that he is continuing to show signs of good improvement which is at least good news. 10/14/17 on evaluation today patient appears to be doing very well in regard to his lower extremity ulcers. He's definitely made some progress as far as healing is concerned although there still are several open areas that are going to need to be addressed. He did have his arterial study today which fortunately shows good findings with a right ABI of 1.23 with a TBI of 0.86 in the left ABI of 1.28 with a TBI of 0.81. This is good  news and will allow [place] to perform debridement as well. 10/23/2017; patient with a large wound on the left lateral calf, sizable area on the left medial malleolus and an area on the right lateral malleolus.  He has a new blister consistent with his underlying blistering skin disease just above this area we have been using Iodoflex on the lateral left calf lateral right ankle and collagen on the medial left ankle. We have been using Kerlix Coban wraps 10/28/17 on evaluation today the patient continues to have signs of improvement in regard to the overall appearance of the original wound. Unfortunately he did have some blistering over the right lateral lower extremity which has appeared to rupture on evaluation today and likely some of the dead tissue on the surface needs to be cleaned away the good news is this does not appear to be to significantly deep at this time. 11/04/17 on evaluation today patient actually appears to be doing a little better in regard to his lower extremity ulcers. He has been tolerating the dressing changes without complication. With that being said he does still have a significant wound especially over the left lateral lower extremity unfortunately. All of the wounds pretty much are going to require sharp debridement today. 11/11/17 on evaluation today patient appears to be doing more poorly in regard to his left lower extremity in particular. There does not appear to be any evidence of systemic infection although the wound itself as far as the larger left lateral lower extremity ulcer actually appears to be infected in my pinion. There's an older and the surface of the wound is dramatically worsened compared to last week. No fevers, chills, nausea, or vomiting noted at this time. 11/18/17 upon evaluation today patient actually appears to be doing better. I did review his culture today which really did not show any specific organism is a positive reason for his wound decline. There are multiple organisms present not predominant. Nonetheless he seems to be tolerate the doxycycline well in his wounds in general do seem to be doing better. Fortunately there does not appear to be  any evidence of infection at this time which is good news. Overall I'm very pleased with how things appear. Nonetheless he still has a lot of healing to go. I do think he could benefit from a Juxta-Lite wrap. 11/25/17 on evaluation today patient actually appears to be doing fairly well in regard to his wounds. He is still taking the antibiotics he has a few days left. Fortunately this seems to have been excellent for him as far as getting the infection control and very happy in this regard. With that being said the patient likewise is also very pleased with how things appear at this time in comparison to where we were he's not having as much pain. 12/02/17 Seen today for follow p and management of LLE wounds. Wounds appear to show some improvement. He denies pain, fever, or chills. Completed a course of doxycycline earlier this month. Scheduled to received Juxta-Lite wrap this week. No s/s of infections. 12/09/17 on evaluation today patient actually appears to be doing a little bit better in regard to his wounds. This is obscene very slow process and unfortunately he has a couple of new areas and this is due to the Epidermolysis Bullosa. Nonetheless I am concerned about the fact that he seems to be getting more areas not less that is the reason we're gonna work on getting schedule for the vascular referral to see the venous  specialist. 12/23/17 upon evaluation today patient's wounds currently shows evidence of still not doing quite as well is what I would like to have seen. Subsequently the patient did have his venous study which showed evidence of venous stasis. Subsequently I do think that a vascular evaluation for consideration of venous intervention would be appropriate. I'm not necessarily suggesting that will be anything that can be done but I think it is at least a good idea. He is in agreement with this plan. 12/30/17 on evaluation today patient actually appears to be doing very well in  regard to his wounds when compared to previous evaluation. Subsequently we have been using the [facility] Dressing which actually appears to have done excellent on his left lateral lower extremity ulcer. The quality of the wound surface is dramatically improved. There is some slight debridement that is going to be required at a couple of locations but overall I'm extremely pleased with how things appear here. 01/07/2018; this is a patient with a primary skin disorder epidermolyis bullosa. Is a large wound on the left lateral calf and smaller wounds on the right however there is a new wound on the right mid tibia area that occurred within the compression wrap that he did not change. We have been using Hydrofera Blue. On the left he is using Hydrofera Blue and Santyl to the inferior part of the wound and changing the dressing himself. 01/15/2018; primary skin disorder epidermolysis bullosa. He has several difficult wounds including the left lateral calf, smaller wounds on the left medial calf and the right lateral calf. The major area on the left lateral calf has a smaller area inferiorly that has necrotic debris we have been using Santyl to this. The rest of the wounds we have been using Hydrofera Blue. The area on the left calf actually looks larger this week. Uncontrolled edema several small open areas above it that are superficial 01/20/18 on evaluation today patient appears to be doing better as compared to last week in regard to his wounds of the bilateral lower extremities. He tolerated the bilateral compression wrap without complication. Overall I'm very pleased with how things appear at this time. The patient likewise is very happy. 01/27/18 on evaluation today patient appears to be doing decently well in regard to his bilateral lower extremity ulcers. He's been tolerating the dressing changes without complication. One issue he had is that he did have more drainage to the left leg wrapped  last week. He states in fact he probably should come in and let us change it on Friday however he just left it in place and kept adding extra absorption with ABD pads to the external portion of the wrap. Unfortunately he does have some aspiration type breakdown nothing significant but I do believe that this was probably counterproductive in general. Nonetheless his wounds do not appear to be terrible overall. 02/03/18 on evaluation today patient appears to be doing rather well in regard to his lower extremity ulcers. He has been tolerating the dressing changes without complication. He does tell me that he had to change the wrap on the left one since we last saw him. Subsequently I do not see any evidence of infection I do feel like the food was much better controlled at this point. 02/10/18 on evaluation today patient appears to be doing rather well in regard to his ulcers. He still has significant alterations especially on the left lateral lower extremity. Fortunately there's no signs of infection at this time. Overall  I feel like he is making good progress are some areas that I'm gonna attempt some debridement today. 02/17/18 on evaluation today patient appears to be doing okay in regard to his lower Trinity ulcer. It does appear on both locations he has a little bit of drainage causing some breakdown in maceration around the wound bed's although it doesn't appear to be too bad the right is a little bit worse than left. Fortunately there's no signs of infection which is good news. No fevers, chills, nausea, or vomiting noted at this time. 03/10/18 on evaluation today patient actually appears to be doing rather poorly in regard to his bilateral lower extremity ulcers. The right in particular is draining profusely and the wound is actually enlarging which is not good. I'm concerned about both possibly infection and the fact that there's a lot of moisture which is causing breakdown as well. Unfortunately  the patient has been trying to change this at home I'm afraid he may need to change more frequently in order to see the improvement that were looking for. There's no signs of systemic infection. 03/17/18 patient actually appears to be doing significantly better at this point in regard to his bilateral lower extremity ulcers. Fortunately there's no signs of infection. That is worsening infection at least indefinitely nothing systemic. With that being said he is having a lot of drainage though not quite as much is there in his last evaluation. Overall feel like he's on the side of improvement. I think if his results back from his culture which showed that he had a positive group B strep along with abundant Pseudomonas noted on the culture. For that reason I am gonna have him continue with the linezolid as we previously have ordered for him and I did go ahead as well today and prescribe Levaquin as well in order to treat the Pseudomonas portion of the infection noted. 03/24/18 on evaluation today patient actually appears to be doing very well in regard to his lower Trinity ulcer. He's been tolerating the dressing changes without complication. Fortunately both legs show signs of less drainage in his edema is very well controlled at this point as well. Overall very pleased with how things seem to be progressing. 03/31/18 on evaluation today patient actually appears to be doing excellent in regard to his bilateral lower extremity ulcers. These are not draining nearly as significant as what they were in the past overall seem to be shown signs of excellent improvement which is great news. Fortunately there is no sign of active infection at this time I do believe that the Levaquin has done extremely well for him in this regard. The patient continues to change these at home typically every day. We may be able to slowly work towards every other day changes since the drainage seems to be slowing down quite  significantly. 04/14/18 on evaluation today patient appears to be doing well in regard to his bilateral lower extremities. Let me Hilda Blades Almost completely healed which is excellent news. Fortunately he's shown signs of improvement all other sites as well with new skin growth there's some slight hyper granular tissue but for the most part this seems to be well maintained with the [facility] Dressing. I'm very happy in this regard. 04/28/18 on evaluation today patient appears to be doing rather well in regard to his ulcers of the bilateral lower extremities all things considering. He continues to make some progress as far as new skin growth. There still some hyper granulation noted  at this point despite the use of the [facility] Dressing. This is not terrible but I think we may want to consider conclude cauterization today with silver nitrate to try to help knock some of his back as well as helping with any biofilm on the surface of the wound. 05/12/18 on evaluation today patient's wounds actually appear to be doing fairly well in regard to the bilateral lower extremities. He's been tolerating the dressing changes without complication. Fortunately there's no signs of active infection at this time which is good news. Overall very pleased with how things seem to be progressing. You select silver nitrate was beneficial for him. 05/26/18 on evaluation today patient appears to be doing better in regard to left lower extremity and a little bit worse in regard to the right lower extremity. He states that he was pulling off the [facility] Dressing peel back some of the skin making this area significantly larger than what it was previous. He's not had any issues other than this and states even that hasn't caused any pain he just seems to obviously have a much larger area on the right when compared to the previous time I saw him. No fevers, chills, nausea, or vomiting noted at this time. 06/16/18 on  evaluation today patient actually appears to be doing a little better in my pinion in regard to his lower summary ulcers. He has new skin islands that seem to be spreading which is good news. Fortunately there's no signs of active infection at this time. His biggest issue is he tells me that coming as often as he does is becoming very cost prohibitive. He wonders if we can potentially spread this out. 07/14/18 on evaluation today patient appears to be doing a little bit worse in regard to his lower from the ulcer. Unfortunately he still continues to have a significant amount of drainage I think we need to do something to try to help this more. He is still somewhat reluctant to go the route of the Wound VAC although that may be the most appropriate thing for him. No fevers, chills, nausea, or vomiting noted at this time. 08/18/2018 on evaluation today patient actually appears to be doing quite well with regard to his bilateral lower extremity ulcers. I do feel like that currently he is making great progress the care max does seem to be doing a great job at helping to control the moisture he has no maceration or skin breakdown. Again this seems to be an excellent way to go. 1 thing we may want to change is adding collagen to the base of the wound and then the care max over top he is not opposed to this. 09/15/2018 on evaluation today patient appears to be doing well with regard to his bilateral lower extremity ulcers. He is showing some signs of improvement not necessarily in size but definitely in appearance. In fact he has a lot of new skin growing throughout the wounds along the edges as well as in the central portion of the wounds on both lower extremities. Overall I am extremely pleased to see this. 10/20/2018 on evaluation today patient actually appears to be doing quite well with regard to his wounds. They are not measuring significantly smaller but he does have a lot of new epithelization noted as  compared to previous. Fortunately there is no signs of active infection at this time. No fevers, chills, nausea, vomiting, or diarrhea. Electronic Signature(s) Signed: 10/20/2018 1:33:08 PM By: Worthy Keeler PA-C  Entered By: Worthy Keeler on 10/20/2018 13:33:08 -------------------------------------------------------------------------------- Physical Exam Details Patient Name: Date of Service: Carlos White, Carlos White 10/20/2018 12:30 PM Medical Record [id number] Patient Account Number: [id number] Date of Birth/Sex: Treating RN: 11-26-1940 (78 y.o. Carlos White Primary Care Provider: Tedra White Other Clinician: Referring Provider: Treating Provider/Extender:Carlos White, Carlos White in Treatment: 25 Constitutional Well-nourished and well-hydrated in no acute distress. Respiratory normal breathing without difficulty. clear to auscultation bilaterally. Cardiovascular regular rate and rhythm with normal S1, S2. Psychiatric this patient is able to make decisions and demonstrates good insight into disease process. Alert and Oriented x 3. pleasant and cooperative. Notes Patient's wound bed currently showed signs of a lot of new epithelization which is good news at this time. Fortunately there is no evidence of active infection and overall I am very pleased in that regard. With that being said he is still continue to wrap this change and everything pretty much every other day he does this on his own at home. Electronic Signature(s) Signed: 10/20/2018 1:34:33 PM By: Worthy Keeler PA-C Entered By: Worthy Keeler on 10/20/2018 13:34:32 -------------------------------------------------------------------------------- Physician Orders Details Patient Name: Date of Service: Carlos White, Carlos White 10/20/2018 12:30 PM Medical Record [id number] Patient Account Number: [id number] Date of Birth/Sex: Treating RN: May 09, 1940 (78 y.o. Carlos White Primary Care Provider:  Tedra White Other Clinician: Referring Provider: Treating Provider/Extender:Carlos White, Carlos White in Treatment: 49 Verbal / Phone Orders: No Diagnosis Coding ICD-10 Coding Code Description I87.2 Venous insufficiency (chronic) (peripheral) Q81.8 Other epidermolysis bullosa L97.322 Non-pressure chronic ulcer of left ankle with fat layer exposed L97.822 Non-pressure chronic ulcer of other part of left lower leg with fat layer exposed L97.812 Non-pressure chronic ulcer of other part of right lower leg with fat layer exposed [place] (primary) hypertension Follow-up Appointments Return appointment in 1 month. Dressing Change Frequency Wound #2 Left,Distal,Lateral Lower Leg Change Dressing every other day. Wound #5 Right,Proximal,Lateral Lower Leg Change Dressing every other day. Skin Barriers/Peri-Wound Care Barrier cream - to periwound areas Moisturizing lotion - both legs with dressing changes Wound Cleansing Wound #2 Left,Distal,Lateral Lower Leg May shower and wash wound with soap and water. - with dressing changes Wound #5 Right,Proximal,Lateral Lower Leg May shower and wash wound with soap and water. - with dressing changes Primary Wound Dressing Wound #2 Left,Distal,Lateral Lower Leg Collagen - fibracol - do not need to moisten Wound #5 Right,Proximal,Lateral Lower Leg Collagen - fibracol - do not need to moisten Secondary Dressing Wound #2 Left,Distal,Lateral Lower Leg Kerlix/Rolled Gauze ABD pad - all wounds Kerramax - or xtrasorb Wound #5 Right,Proximal,Lateral Lower Leg Kerlix/Rolled Gauze ABD pad - all wounds Kerramax Edema Control Avoid standing for long periods of time Elevate legs to the level of the heart or above for 30 minutes daily and/or when sitting, a frequency of: - throughout the day Support Garment 20-30 mm/Hg pressure to: - patient to apply juxtalites daily to both legs over ace wraps Other: - ACE wrap both legs Electronic  Signature(s) Signed: 10/21/2018 12:09:12 PM By: Baruch Gouty RN, BSN Signed: 10/24/2018 10:37:03 PM By: Worthy Keeler PA-C Entered By: Baruch Gouty on 10/20/2018 13:30:45 -------------------------------------------------------------------------------- Problem List Details Patient Name: Date of Service: Carlos White 10/20/2018 12:30 PM Medical Record [id number] Patient Account Number: [id number] Date of Birth/Sex: Treating RN: December 26, 1940 (78 y.o. Carlos White Primary Care Provider: Tedra White Other Clinician: Referring Provider: Treating Provider/Extender:Carlos White, Carlos White in Treatment: 58 Active Problems ICD-10 Evaluated Encounter Code  Description Active Date Today Diagnosis I87.2 Venous insufficiency (chronic) (peripheral) 09/30/2017 No Yes Q81.8 Other epidermolysis bullosa 09/30/2017 No Yes L97.322 Non-pressure chronic ulcer of left ankle with fat layer 09/30/2017 No Yes exposed L97.822 Non-pressure chronic ulcer of other part of left lower 09/30/2017 No Yes leg with fat layer exposed L97.812 Non-pressure chronic ulcer of other part of right lower 09/30/2017 No Yes leg with fat layer exposed I10 Essential (primary) hypertension 09/30/2017 No Yes Inactive Problems Resolved Problems Electronic Signature(s) Signed: 10/20/2018 1:22:03 PM By: Worthy Keeler PA-C Entered By: Worthy Keeler on 10/20/2018 13:22:03 -------------------------------------------------------------------------------- Progress Note Details Patient Name: Date of Service: Carlos White 10/20/2018 12:30 PM Medical Record [id number] Patient Account Number: [id number] Date of Birth/Sex: Treating RN: Mar 06, 1940 (78 y.o. Carlos White Primary Care Provider: Tedra White Other Clinician: Referring Provider: Treating Provider/Extender:Carlos White, Carlos White in Treatment: 73 Subjective Chief Complaint Information obtained from Patient Bilateral LE  Ulcers History of Present Illness (HPI) 09/30/17 on evaluation today patient presents for initial evaluation and our clinic concerning issues that he has been having with his bilateral lower extremities. He states this has been going on for quite some time at least six months. Currently his regiment has been mainly cleaning the area with peroxide, applying the is foreign ointment, and wrapping the area with ABD pads and then an ace wrap loosely. He has dealt with issues of this nature he tells me for quite some time. He does have a history of having had a compound fracture of the left lower extremity which he thinks also makes this a much more difficult area for him to heal. He's previously been told that he had poor vascular flow but this was years ago at [facility] we do not have any of those records at this time. He has a history of Epidermolysis Bullosa which was diagnosed around age 77 and he has been cared for at [facility] since that time. Subsequently he states this is hereditary and two of his children one male and one male also have this as well is one of his grandchildren that he is aware of. He has no evidence of infection necessarily at this point although he does have some necrotic tissue noted on the surface of the wound as far as the largest, left lateral lower extremity ulcer, is concerned. Overall I feel like all things considered he's been taking care of this very well. Obviously he has some fairly significant issues going on at this point in this regard. He does have a history otherwise of hypertension though for the most part other than the compound fracture of the left leg he seems to have been fairly healthy in my pinion. 10/07/17 on evaluation today patient actually appears to be doing better in regard to his bilateral lower extremity ulcers. With that being said he does still have some evidence of slough noted on the surface of the wounds I think the Iodoflex has been beneficial for  him. His arterial studies are scheduled for October 2. With that being said I do believe that he is continuing to show signs of good improvement which is at least good news. 10/14/17 on evaluation today patient appears to be doing very well in regard to his lower extremity ulcers. He's definitely made some progress as far as healing is concerned although there still are several open areas that are going to need to be addressed. He did have his arterial study today which fortunately shows good findings with a  right ABI of 1.23 with a TBI of 0.86 in the left ABI of 1.28 with a TBI of 0.81. This is good news and will allow [place] to perform debridement as well. 10/23/2017; patient with a large wound on the left lateral calf, sizable area on the left medial malleolus and an area on the right lateral malleolus. He has a new blister consistent with his underlying blistering skin disease just above this area we have been using Iodoflex on the lateral left calf lateral right ankle and collagen on the medial left ankle. We have been using Kerlix Coban wraps 10/28/17 on evaluation today the patient continues to have signs of improvement in regard to the overall appearance of the original wound. Unfortunately he did have some blistering over the right lateral lower extremity which has appeared to rupture on evaluation today and likely some of the dead tissue on the surface needs to be cleaned away the good news is this does not appear to be to significantly deep at this time. 11/04/17 on evaluation today patient actually appears to be doing a little better in regard to his lower extremity ulcers. He has been tolerating the dressing changes without complication. With that being said he does still have a significant wound especially over the left lateral lower extremity unfortunately. All of the wounds pretty much are going to require sharp debridement today. 11/11/17 on evaluation today patient appears to be doing  more poorly in regard to his left lower extremity in particular. There does not appear to be any evidence of systemic infection although the wound itself as far as the larger left lateral lower extremity ulcer actually appears to be infected in my pinion. There's an older and the surface of the wound is dramatically worsened compared to last week. No fevers, chills, nausea, or vomiting noted at this time. 11/18/17 upon evaluation today patient actually appears to be doing better. I did review his culture today which really did not show any specific organism is a positive reason for his wound decline. There are multiple organisms present not predominant. Nonetheless he seems to be tolerate the doxycycline well in his wounds in general do seem to be doing better. Fortunately there does not appear to be any evidence of infection at this time which is good news. Overall I'm very pleased with how things appear. Nonetheless he still has a lot of healing to go. I do think he could benefit from a Juxta-Lite wrap. 11/25/17 on evaluation today patient actually appears to be doing fairly well in regard to his wounds. He is still taking the antibiotics he has a few days left. Fortunately this seems to have been excellent for him as far as getting the infection control and very happy in this regard. With that being said the patient likewise is also very pleased with how things appear at this time in comparison to where we were he's not having as much pain. 12/02/17 Seen today for follow p and management of LLE wounds. Wounds appear to show some improvement. He denies pain, fever, or chills. Completed a course of doxycycline earlier this month. Scheduled to received Juxta-Lite wrap this week. No s/s of infections. 12/09/17 on evaluation today patient actually appears to be doing a little bit better in regard to his wounds. 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 Nonetheless I am concerned about the fact that he seems to be  getting more areas not less that is the reason we're gonna work on getting schedule for the vascular referral to see the venous specialist. 12/23/17 upon evaluation today patient's wounds currently shows evidence of still not doing quite as well is what I would like to have seen. Subsequently the patient did have his venous study which showed evidence of venous stasis. Subsequently I do think that a vascular evaluation for consideration of venous intervention would be appropriate. I'm not necessarily suggesting that will be anything that can be done but I think it is at least a good idea. He is in agreement with this plan. 12/30/17 on evaluation today patient actually appears to be doing very well in regard to his wounds when compared to previous evaluation. Subsequently we have been using the [facility] Dressing which actually appears to have done excellent on his left lateral lower extremity ulcer. The quality of the wound surface is dramatically improved. There is some slight debridement that is going to be required at a couple of locations but overall I'm extremely pleased with how things appear here. 01/07/2018; this is a patient with a primary skin disorder epidermolyis bullosa. Is a large wound on the left lateral calf and smaller wounds on the right however there is a new wound on the right mid tibia area that occurred within the compression wrap that he did not change. We have been using Hydrofera Blue. On the left he is using Hydrofera Blue and Santyl to the inferior part of the wound and changing the dressing himself. 01/15/2018; primary skin disorder epidermolysis bullosa. He has several difficult wounds including the left lateral calf, smaller wounds on the left medial calf and the right lateral calf. The major area on the left lateral calf has a smaller area inferiorly that has  necrotic debris we have been using Santyl to this. The rest of the wounds we have been using Hydrofera Blue. The area on the left calf actually looks larger this week. Uncontrolled edema several small open areas above it that are superficial 01/20/18 on evaluation today patient appears to be doing better as compared to last week in regard to his wounds of the bilateral lower extremities. He tolerated the bilateral compression wrap without complication. Overall I'm very pleased with how things appear at this time. The patient likewise is very happy. 01/27/18 on evaluation today patient appears to be doing decently well in regard to his bilateral lower extremity ulcers. He's been tolerating the dressing changes without complication. One issue he had is that he did have more drainage to the left leg wrapped last week. He states in fact he probably should come in and let us change it on Friday however he just left it in place and kept adding extra absorption with ABD pads to the external portion of the wrap. Unfortunately he does have some aspiration type breakdown nothing significant but I do believe that this was probably counterproductive in general. Nonetheless his wounds do not appear to be terrible overall. 02/03/18 on evaluation today patient appears to be doing rather well in regard to his lower extremity ulcers. He has been tolerating the dressing changes without complication. He does tell me that he had to change the wrap on the left one since we last saw him. Subsequently I do not see any evidence of infection I do feel like the food was much better controlled at this point. 02/10/18 on evaluation today patient appears to be doing rather well in regard to his ulcers.  He still has significant alterations especially on the left lateral lower extremity. Fortunately there's no signs of infection at this time. Overall I feel like he is making good progress are some areas that I'm gonna attempt some  debridement today. 02/17/18 on evaluation today patient appears to be doing okay in regard to his lower Trinity ulcer. It does appear on both locations he has a little bit of drainage causing some breakdown in maceration around the wound bed's although it doesn't appear to be too bad the right is a little bit worse than left. Fortunately there's no signs of infection which is good news. No fevers, chills, nausea, or vomiting noted at this time. 03/10/18 on evaluation today patient actually appears to be doing rather poorly in regard to his bilateral lower extremity ulcers. The right in particular is draining profusely and the wound is actually enlarging which is not good. I'm concerned about both possibly infection and the fact that there's a lot of moisture which is causing breakdown as well. Unfortunately the patient has been trying to change this at home I'm afraid he may need to change more frequently in order to see the improvement that were looking for. There's no signs of systemic infection. 03/17/18 patient actually appears to be doing significantly better at this point in regard to his bilateral lower extremity ulcers. Fortunately there's no signs of infection. That is worsening infection at least indefinitely nothing systemic. With that being said he is having a lot of drainage though not quite as much is there in his last evaluation. Overall feel like he's on the side of improvement. I think if his results back from his culture which showed that he had a positive group B strep along with abundant Pseudomonas noted on the culture. For that reason I am gonna have him continue with the linezolid as we previously have ordered for him and I did go ahead as well today and prescribe Levaquin as well in order to treat the Pseudomonas portion of the infection noted. 03/24/18 on evaluation today patient actually appears to be doing very well in regard to his lower Trinity ulcer. He's been tolerating the  dressing changes without complication. Fortunately both legs show signs of less drainage in his edema is very well controlled at this point as well. Overall very pleased with how things seem to be progressing. 03/31/18 on evaluation today patient actually appears to be doing excellent in regard to his bilateral lower extremity ulcers. These are not draining nearly as significant as what they were in the past overall seem to be shown signs of excellent improvement which is great news. Fortunately there is no sign of active infection at this time I do believe that the Levaquin has done extremely well for him in this regard. The patient continues to change these at home typically every day. We may be able to slowly work towards every other day changes since the drainage seems to be slowing down quite significantly. 04/14/18 on evaluation today patient appears to be doing well in regard to his bilateral lower extremities. Let me [facility] Almost completely healed which is excellent news. Fortunately he's shown signs of improvement all other sites as well with new skin growth there's some slight hyper granular tissue but for the most part this seems to be well maintained with the [facility] Dressing. I'm very happy in this regard. 04/28/18 on evaluation today patient appears to be doing rather well in regard to his ulcers of the bilateral lower  extremities all things considering. He continues to make some progress as far as new skin growth. There still some hyper granulation noted at this point despite the use of the [facility] Dressing. This is not terrible but I think we may want to consider conclude cauterization today with silver nitrate to try to help knock some of his back as well as helping with any biofilm on the surface of the wound. 05/12/18 on evaluation today patient's wounds actually appear to be doing fairly well in regard to the bilateral lower extremities. He's been tolerating the  dressing changes without complication. Fortunately there's no signs of active infection at this time which is good news. Overall very pleased with how things seem to be progressing. You select silver nitrate was beneficial for him. 05/26/18 on evaluation today patient appears to be doing better in regard to left lower extremity and a little bit worse in regard to the right lower extremity. He states that he was pulling off the [facility] Dressing peel back some of the skin making this area significantly larger than what it was previous. He's not had any issues other than this and states even that hasn't caused any pain he just seems to obviously have a much larger area on the right when compared to the previous time I saw him. No fevers, chills, nausea, or vomiting noted at this time. 06/16/18 on evaluation today patient actually appears to be doing a little better in my pinion in regard to his lower summary ulcers. He has new skin islands that seem to be spreading which is good news. Fortunately there's no signs of active infection at this time. His biggest issue is he tells me that coming as often as he does is becoming very cost prohibitive. He wonders if we can potentially spread this out. 07/14/18 on evaluation today patient appears to be doing a little bit worse in regard to his lower from the ulcer. Unfortunately he still continues to have a significant amount of drainage I think we need to do something to try to help this more. He is still somewhat reluctant to go the route of the Wound VAC although that may be the most appropriate thing for him. No fevers, chills, nausea, or vomiting noted at this time. 08/18/2018 on evaluation today patient actually appears to be doing quite well with regard to his bilateral lower extremity ulcers. I do feel like that currently he is making great progress the care max does seem to be doing a great job at helping to control the moisture he has no maceration  or skin breakdown. Again this seems to be an excellent way to go. 1 thing we may want to change is adding collagen to the base of the wound and then the care max over top he is not opposed to this. 09/15/2018 on evaluation today patient appears to be doing well with regard to his bilateral lower extremity ulcers. He is showing some signs of improvement not necessarily in size but definitely in appearance. In fact he has a lot of new skin growing throughout the wounds along the edges as well as in the central portion of the wounds on both lower extremities. Overall I am extremely pleased to see this. 10/20/2018 on evaluation today patient actually appears to be doing quite well with regard to his wounds. They are not measuring significantly smaller but he does have a lot of new epithelization noted as compared to previous. Fortunately there is no signs of active  infection at this time. No fevers, chills, nausea, vomiting, or diarrhea. Patient History Information obtained from Patient. Family History Stroke - Maternal Grandparents, No family history of Cancer, Diabetes, Heart Disease, Hereditary Spherocytosis, Hypertension, Kidney Disease, Lung Disease, Seizures, Thyroid Problems, Tuberculosis. Social History Former smoker - quit 1986, Marital Status - Married, Alcohol Use - Never, Drug Use - No History, Caffeine Use - Daily - coffee. Medical History Eyes Patient has history of Cataracts Denies history of Glaucoma, Optic Neuritis Ear/Nose/Mouth/Throat Denies history of Chronic sinus problems/congestion, Middle ear problems Hematologic/Lymphatic Denies history of Anemia, Hemophilia, Human Immunodeficiency Virus, Lymphedema, Sickle Cell Disease Respiratory Patient has history of Chronic Obstructive Pulmonary Disease (COPD) Denies history of Aspiration, Asthma, Pneumothorax, Sleep Apnea, Tuberculosis Cardiovascular Patient has history of Hypertension Denies history of Angina, Arrhythmia,  Congestive Heart Failure, Coronary Artery Disease, Deep Vein Thrombosis, Hypotension, Myocardial Infarction, Peripheral Arterial Disease, Peripheral Venous Disease, Phlebitis, Vasculitis Gastrointestinal Denies history of Cirrhosis , Colitis, Crohnoos, Hepatitis A, Hepatitis B, Hepatitis C Endocrine Denies history of Type I Diabetes, Type II Diabetes Genitourinary Denies history of End Stage Renal Disease Immunological Denies history of Lupus Erythematosus, Raynaudoos, Scleroderma Integumentary (Skin) Denies history of History of Burn Musculoskeletal Denies history of Gout, Rheumatoid Arthritis, Osteoarthritis, Osteomyelitis Neurologic Denies history of Dementia, Neuropathy, Quadriplegia, Paraplegia, Seizure Disorder Oncologic Denies history of Received Chemotherapy, Received Radiation Psychiatric Denies history of Anorexia/bulimia, Confinement Anxiety Review of Systems (ROS) Constitutional Symptoms (General Health) Denies complaints or symptoms of Fatigue, Fever, Chills, Marked Weight Change. Respiratory Denies complaints or symptoms of Chronic or frequent coughs, Shortness of Breath. Cardiovascular Denies complaints or symptoms of Chest pain. Psychiatric Denies complaints or symptoms of Claustrophobia, Suicidal. Objective Constitutional Well-nourished and well-hydrated in no acute distress. Vitals Time Taken: 1:08 PM, Height: 71 in, Weight: 220 lbs, BMI: 30.7, Temperature: 97.8 F, Pulse: 63 bpm, Respiratory Rate: 18 breaths/min, Blood Pressure: 152/62 mmHg. Respiratory normal breathing without difficulty. clear to auscultation bilaterally. Cardiovascular regular rate and rhythm with normal S1, S2. Psychiatric this patient is able to make decisions and demonstrates good insight into disease process. Alert and Oriented x 3. pleasant and cooperative. General Notes: Patient's wound bed currently showed signs of a lot of new epithelization which is good news at this time.  Fortunately there is no evidence of active infection and overall I am very pleased in that regard. With that being said he is still continue to wrap this change and everything pretty much every other day he does this on his own at home. Integumentary (Hair, Skin) Wound #2 status is Open. Original cause of wound was Gradually Appeared. The wound is located on the Left,Distal,Lateral Lower Leg. The wound measures 15cm length x 12.3cm width x 0.1cm depth; 144.906cm^2 area and 14.491cm^3 volume. There is Fat Layer (Subcutaneous Tissue) Exposed exposed. There is no tunneling or undermining noted. There is a medium amount of serosanguineous drainage noted. The wound margin is flat and intact. There is large (67-100%) red, hyper - granulation within the wound bed. There is a small (1-33%) amount of necrotic tissue within the wound bed including Adherent Slough. Wound #5 status is Open. Original cause of wound was Gradually Appeared. The wound is located on the Right,Proximal,Lateral Lower Leg. The wound measures 11cm length x 10.5cm width x 0.1cm depth; 90.713cm^2 area and 9.071cm^3 volume. There is Fat Layer (Subcutaneous Tissue) Exposed exposed. There is no tunneling or undermining noted. There is a medium amount of serosanguineous drainage noted. The wound margin is flat and intact. There is large (67-100%)  red, hyper - granulation within the wound bed. There is no necrotic tissue within the wound bed. 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 Dressing Change Frequency: Wound #2 Left,Distal,Lateral Lower  Leg: Change Dressing every other day. Wound #5 Right,Proximal,Lateral Lower Leg: Change Dressing every other day. Skin Barriers/Peri-Wound Care: Barrier cream - to periwound areas Moisturizing lotion - both legs with dressing changes Wound Cleansing: Wound #2 Left,Distal,Lateral Lower Leg: May shower and wash wound with soap and water. - with dressing changes Wound #5 Right,Proximal,Lateral Lower Leg: May shower and wash wound with soap and water. - with dressing changes Primary Wound Dressing: Wound #2 Left,Distal,Lateral Lower Leg: Collagen - fibracol - do not need to moisten Wound #5 Right,Proximal,Lateral Lower Leg: Collagen - fibracol - do not need to moisten Secondary Dressing: Wound #2 Left,Distal,Lateral Lower Leg: Kerlix/Rolled Gauze ABD pad - all wounds Kerramax - or xtrasorb Wound #5 Right,Proximal,Lateral Lower Leg: Kerlix/Rolled Gauze ABD pad - all wounds Kerramax Edema Control: Avoid standing for long periods of time Elevate legs to the level of the heart or above for 30 minutes daily and/or when sitting, a frequency of: - throughout the day Support Garment 20-30 mm/Hg pressure to: - patient to apply juxtalites daily to both legs over ace wraps Other: - ACE wrap both legs 1. I would recommend currently that we go ahead and continue with the collagen to the wound bed it does seem to be helping with some new epithelization which is great news. No sharp debridement was necessary today. 2. I am also going to suggest that the patient continue with the ABD pads as well as the XtraSorb which seems to be doing a good job keeping his drainage under good control. Preventing any additional moisture associated skin damage. 3. The patient is continuing to elevate his legs as much as possible he is also wrapping this and using the juxta lite wraps which does seem to be beneficial at this time. 4. I am get a look into the potential for either using Apligraf or potentially Oasis  burn in order to try and help with more appropriate and rapid healing as far as depth patient is concerned. With that being said that something that I discussed with the patient today were going to see how things do and will run his insurance to see what it may or may not cover in that regard. 5. Also discussed with the patient he could potentially see a plastic surgeon to talk about skin grafting to try to speed this up but again that something he states has been down that road before and unfortunately they really were not able to do anything for him. We will see patient back for reevaluation in 1 week here in the clinic. If anything worsens or changes patient will contact our office for additional recommendations. Electronic Signature(s) Signed: 10/20/2018 1:36:09 PM By: Worthy Keeler PA-C Entered By: Worthy Keeler on 10/20/2018 13:36:08 -------------------------------------------------------------------------------- HxROS Details Patient Name: Date of Service: Carlos White 10/20/2018 12:30 PM Medical Record [id number] Patient Account Number: [id number] Date of Birth/Sex: Treating RN: [birth year] (78 y.o. Carlos White Primary Care Provider: Tedra White Other Clinician: Referring Provider: Treating Provider/Extender:Carlos III, Margarita Grizzle  Carlos White White in Treatment: 79 Information Obtained From Patient Constitutional Symptoms (General Health) Complaints and Symptoms: Negative for: Fatigue; Fever; Chills; Marked Weight Change Respiratory Complaints and Symptoms: Negative for: Chronic or frequent coughs; Shortness of Breath Medical History: Positive for: Chronic Obstructive Pulmonary Disease (COPD) Negative for: Aspiration; Asthma; Pneumothorax; Sleep Apnea; Tuberculosis Cardiovascular Complaints and Symptoms: Negative for: Chest pain Medical History: Positive for: Hypertension Negative for: Angina; Arrhythmia; Congestive Heart Failure; Coronary Artery Disease;  Deep Vein Thrombosis; Hypotension; Myocardial Infarction; Peripheral Arterial Disease; Peripheral Venous Disease; Phlebitis; Vasculitis Psychiatric Complaints and Symptoms: Negative for: Claustrophobia; Suicidal Medical History: Negative for: Anorexia/bulimia; Confinement Anxiety Eyes Medical History: Positive for: Cataracts Negative for: Glaucoma; Optic Neuritis Ear/Nose/Mouth/Throat Medical History: Negative for: Chronic sinus problems/congestion; Middle ear problems Hematologic/Lymphatic Medical History: Negative for: Anemia; Hemophilia; Human Immunodeficiency Virus; Lymphedema; Sickle Cell Disease Gastrointestinal Medical History: Negative for: Cirrhosis ; Colitis; Crohns; Hepatitis A; Hepatitis B; Hepatitis C Endocrine Medical History: Negative for: Type I Diabetes; Type II Diabetes Genitourinary Medical History: Negative for: End Stage Renal Disease Immunological Medical History: Negative for: Lupus Erythematosus; Raynauds; Scleroderma Integumentary (Skin) Medical History: Negative for: History of Burn Musculoskeletal Medical History: Negative for: Gout; Rheumatoid Arthritis; Osteoarthritis; Osteomyelitis Neurologic Medical History: Negative for: Dementia; Neuropathy; Quadriplegia; Paraplegia; Seizure Disorder Oncologic Medical History: Negative for: Received Chemotherapy; Received Radiation HBO Extended History Items Eyes: Cataracts Immunizations Pneumococcal Vaccine: Received Pneumococcal Vaccination: No Implantable Devices No devices added Family and Social History Cancer: No; Diabetes: No; Heart Disease: No; Hereditary Spherocytosis: No; Hypertension: No; Kidney Disease: No; Lung Disease: No; Seizures: No; Stroke: Yes - Maternal Grandparents; Thyroid Problems: No; Tuberculosis: No; Former smoker - quit 1986; Marital Status - Married; Alcohol Use: Never; Drug Use: No History; Caffeine Use: Daily - coffee; Financial Concerns: No; Food, Clothing or Shelter  Needs: No; Support System Lacking: No; Transportation Concerns: No Physician Affirmation I have reviewed and agree with the above information. Electronic Signature(s) Signed: 10/21/2018 12:09:12 PM By: Baruch Gouty RN, BSN Signed: 10/24/2018 10:37:03 PM By: Worthy Keeler PA-C Entered By: Worthy Keeler on 10/20/2018 13:33:21 -------------------------------------------------------------------------------- SuperBill Details Patient Name: Date of Service: Carlos White, Carlos White 10/20/2018 Medical Record [id number] Patient Account Number: [id number] Date of Birth/Sex: Treating RN: Jan 03, 1941 (78 y.o. 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.812 Non-pressure chronic ulcer of other part of right lower leg with fat layer exposed [place] (primary) hypertension Facility Procedures CPT4 Code: [id number] Description: 99214 - WOUND CARE VISIT-LEV 4 EST PT Modifier: Quantity: 1 Physician Procedures CPT4 Code Description: [id number] - WC PHYS LEVEL 4 - EST PT ICD-10 Diagnosis Description I87.2 Venous insufficiency (chronic) (peripheral) 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

## 2018-11-17 ENCOUNTER — Other Ambulatory Visit: Payer: Self-pay

## 2018-11-17 ENCOUNTER — Encounter (HOSPITAL_BASED_OUTPATIENT_CLINIC_OR_DEPARTMENT_OTHER): Payer: Medicare Other | Attending: Physician Assistant | Admitting: Physician Assistant

## 2018-11-17 ENCOUNTER — Other Ambulatory Visit (HOSPITAL_COMMUNITY)
Admission: RE | Admit: 2018-11-17 | Discharge: 2018-11-17 | Disposition: A | Discharge status: Home / Self Care | Payer: Medicare Other | Source: Other Acute Inpatient Hospital | Attending: Physician Assistant | Admitting: Physician Assistant

## 2018-11-17 DIAGNOSIS — I872 Venous insufficiency (chronic) (peripheral): Secondary | ICD-10-CM | POA: Insufficient documentation

## 2018-11-17 DIAGNOSIS — J449 Chronic obstructive pulmonary disease, unspecified: Secondary | ICD-10-CM | POA: Diagnosis not present

## 2018-11-17 DIAGNOSIS — Z881 Allergy status to other antibiotic agents status: Secondary | ICD-10-CM | POA: Insufficient documentation

## 2018-11-17 DIAGNOSIS — L97822 Non-pressure chronic ulcer of other part of left lower leg with fat layer exposed: Secondary | ICD-10-CM | POA: Insufficient documentation

## 2018-11-17 DIAGNOSIS — L97812 Non-pressure chronic ulcer of other part of right lower leg with fat layer exposed: Secondary | ICD-10-CM | POA: Insufficient documentation

## 2018-11-17 DIAGNOSIS — Z87891 Personal history of nicotine dependence: Secondary | ICD-10-CM | POA: Diagnosis not present

## 2018-11-17 DIAGNOSIS — L97322 Non-pressure chronic ulcer of left ankle with fat layer exposed: Secondary | ICD-10-CM | POA: Insufficient documentation

## 2018-11-17 DIAGNOSIS — I10 Essential (primary) hypertension: Secondary | ICD-10-CM | POA: Diagnosis not present

## 2018-11-17 DIAGNOSIS — Q819 Epidermolysis bullosa, unspecified: Secondary | ICD-10-CM | POA: Insufficient documentation

## 2018-11-17 NOTE — Progress Notes (Addendum)
Carlos White, Carlos White ([id number]) Visit Report for 11/17/2018 Chief Complaint Document Details Patient Name: Date of Service: BOLIVAR, FIGURA 11/17/2018 12:30 PM Medical Record [id number] Patient Account Number: [id number] Date of Birth/Sex: Treating RN: 1940-10-31 (78 y.o. Male) Baruch Gouty Primary Care Provider: [name] Other Clinician: Referring Provider: Treating Provider/Extender:Stone III, Sharen Hint, MARY Weeks in Treatment: 78 Information Obtained from: Patient Chief Complaint Bilateral LE Ulcers Electronic Signature(s) Signed: 11/17/2018 6:48:11 PM By: Worthy Keeler PA-C Entered By: Worthy Keeler on 11/17/2018 13:34:46 -------------------------------------------------------------------------------- HPI Details Patient Name: Date of Service: Carlos White, Carlos White 11/17/2018 12:30 PM Medical Record [id number] Patient Account Number: [id number] Date of Birth/Sex: Treating RN: [birth year] (78 y.o. Male) Baruch Gouty Primary Care Provider: [name] Other Clinician: Referring Provider: Treating Provider/Extender:Stone III, Sharen Hint, MARY Weeks in Treatment: 5 History of Present Illness HPI Description: 09/30/17 on evaluation today patient presents for initial evaluation and our clinic concerning issues that he has been having with his bilateral lower extremities. He states this has been going on for quite some time at least six months. Currently his regiment has been mainly cleaning the area with peroxide, applying the is foreign ointment, and wrapping the area with ABD pads and then an ace wrap loosely. He has dealt with issues of this nature he tells me for quite some time. He does have a history of having had a compound fracture of the left lower extremity which he thinks also makes this a much more difficult area for him to heal. He's previously been told that he had poor vascular flow but this was years ago at [facility] we do not have any of those  records at this time. He has a history of Epidermolysis Bullosa which was diagnosed around age 66 and he has been cared for at [facility] since that time. Subsequently he states this is hereditary and two of his children one male and one male also have this as well is one of his grandchildren that he is aware of. He has no evidence of infection necessarily at this point although he does have some necrotic tissue noted on the surface of the wound as far as the largest, left lateral lower extremity ulcer, is concerned. Overall I feel like all things considered he's been taking care of this very well. Obviously he has some fairly significant issues going on at this point in this regard. He does have a history otherwise of hypertension though for the most part other than the compound fracture of the left leg he seems to have been fairly healthy in my pinion. 10/07/17 on evaluation today patient actually appears to be doing better in regard to his bilateral lower extremity ulcers. With that being said he does still have some evidence of slough noted on the surface of the wounds I think the Iodoflex has been beneficial for him. His arterial studies are scheduled for October 2. With that being said I do believe that he is continuing to show signs of good improvement which is at least good news. 10/14/17 on evaluation today patient appears to be doing very well in regard to his lower extremity ulcers. He's definitely made some progress as far as healing is concerned although there still are several open areas that are going to need to be addressed. He did have his arterial study today which fortunately shows good findings with a right ABI of 1.23 with a TBI of 0.86 in the left ABI of 1.28 with a TBI of 0.81. This is good  news and will allow [place] to perform debridement as well. 10/23/2017; patient with a large wound on the left lateral calf, sizable area on the left medial malleolus and an area on the right lateral  malleolus. He has a new blister consistent with his underlying blistering skin disease just above this area we have been using Iodoflex on the lateral left calf lateral right ankle and collagen on the medial left ankle. We have been using Kerlix Coban wraps 10/28/17 on evaluation today the patient continues to have signs of improvement in regard to the overall appearance of the original wound. Unfortunately he did have some blistering over the right lateral lower extremity which has appeared to rupture on evaluation today and likely some of the dead tissue on the surface needs to be cleaned away the good news is this does not appear to be to significantly deep at this time. 11/04/17 on evaluation today patient actually appears to be doing a little better in regard to his lower extremity ulcers. He has been tolerating the dressing changes without complication. With that being said he does still have a significant wound especially over the left lateral lower extremity unfortunately. All of the wounds pretty much are going to require sharp debridement today. 11/11/17 on evaluation today patient appears to be doing more poorly in regard to his left lower extremity in particular. There does not appear to be any evidence of systemic infection although the wound itself as far as the larger left lateral lower extremity ulcer actually appears to be infected in my pinion. There's an older and the surface of the wound is dramatically worsened compared to last week. No fevers, chills, nausea, or vomiting noted at this time. 11/18/17 upon evaluation today patient actually appears to be doing better. I did review his culture today which really did not show any specific organism is a positive reason for his wound decline. There are multiple organisms present not predominant. Nonetheless he seems to be tolerate the doxycycline well in his wounds in general do seem to be doing better. Fortunately there does not  appear to be any evidence of infection at this time which is good news. Overall I'm very pleased with how things appear. Nonetheless he still has a lot of healing to go. I do think he could benefit from a Juxta-Lite wrap. 11/25/17 on evaluation today patient actually appears to be doing fairly well in regard to his wounds. He is still taking the antibiotics he has a few days left. Fortunately this seems to have been excellent for him as far as getting the infection control and very happy in this regard. With that being said the patient likewise is also very pleased with how things appear at this time in comparison to where we were he's not having as much pain. 12/02/17 Seen today for follow p and management of LLE wounds. Wounds appear to show some improvement. He denies pain, fever, or chills. Completed a course of doxycycline earlier this month. Scheduled to received Juxta-Lite wrap this week. No s/s of infections. 12/09/17 on evaluation today patient actually appears to be doing a little bit better in regard to his wounds. This is obscene very slow process and unfortunately he has a couple of new areas and this is due to the Epidermolysis Bullosa. Nonetheless I am concerned about the fact that he seems to be getting more areas not less that is the reason we're gonna work on getting schedule for the vascular referral to see the venous  specialist. 12/23/17 upon evaluation today patient's wounds currently shows evidence of still not doing quite as well is what I would like to have seen. Subsequently the patient did have his venous study which showed evidence of venous stasis. Subsequently I do think that a vascular evaluation for consideration of venous intervention would be appropriate. I'm not necessarily suggesting that will be anything that can be done but I think it is at least a good idea. He is in agreement with this plan. 12/30/17 on evaluation today patient actually appears to be doing  very well in regard to his wounds when compared to previous evaluation. Subsequently we have been using the [facility] Dressing which actually appears to have done excellent on his left lateral lower extremity ulcer. The quality of the wound surface is dramatically improved. There is some slight debridement that is going to be required at a couple of locations but overall I'm extremely pleased with how things appear here. 01/07/2018; this is a patient with a primary skin disorder epidermolyis bullosa. Is a large wound on the left lateral calf and smaller wounds on the right however there is a new wound on the right mid tibia area that occurred within the compression wrap that he did not change. We have been using Hydrofera Blue. On the left he is using Hydrofera Blue and Santyl to the inferior part of the wound and changing the dressing himself. 01/15/2018; primary skin disorder epidermolysis bullosa. He has several difficult wounds including the left lateral calf, smaller wounds on the left medial calf and the right lateral calf. The major area on the left lateral calf has a smaller area inferiorly that has necrotic debris we have been using Santyl to this. The rest of the wounds we have been using Hydrofera Blue. The area on the left calf actually looks larger this week. Uncontrolled edema several small open areas above it that are superficial 01/20/18 on evaluation today patient appears to be doing better as compared to last week in regard to his wounds of the bilateral lower extremities. He tolerated the bilateral compression wrap without complication. Overall I'm very pleased with how things appear at this time. The patient likewise is very happy. 01/27/18 on evaluation today patient appears to be doing decently well in regard to his bilateral lower extremity ulcers. He's been tolerating the dressing changes without complication. One issue he had is that he did have more drainage to the left  leg wrapped last week. He states in fact he probably should come in and let us change it on Friday however he just left it in place and kept adding extra absorption with ABD pads to the external portion of the wrap. Unfortunately he does have some aspiration type breakdown nothing significant but I do believe that this was probably counterproductive in general. Nonetheless his wounds do not appear to be terrible overall. 02/03/18 on evaluation today patient appears to be doing rather well in regard to his lower extremity ulcers. He has been tolerating the dressing changes without complication. He does tell me that he had to change the wrap on the left one since we last saw him. Subsequently I do not see any evidence of infection I do feel like the food was much better controlled at this point. 02/10/18 on evaluation today patient appears to be doing rather well in regard to his ulcers. He still has significant alterations especially on the left lateral lower extremity. Fortunately there's no signs of infection at this time. Overall  I feel like he is making good progress are some areas that I'm gonna attempt some debridement today. 02/17/18 on evaluation today patient appears to be doing okay in regard to his lower Trinity ulcer. It does appear on both locations he has a little bit of drainage causing some breakdown in maceration around the wound bed's although it doesn't appear to be too bad the right is a little bit worse than left. Fortunately there's no signs of infection which is good news. No fevers, chills, nausea, or vomiting noted at this time. 03/10/18 on evaluation today patient actually appears to be doing rather poorly in regard to his bilateral lower extremity ulcers. The right in particular is draining profusely and the wound is actually enlarging which is not good. I'm concerned about both possibly infection and the fact that there's a lot of moisture which is causing breakdown as well.  Unfortunately the patient has been trying to change this at home I'm afraid he may need to change more frequently in order to see the improvement that were looking for. There's no signs of systemic infection. 03/17/18 patient actually appears to be doing significantly better at this point in regard to his bilateral lower extremity ulcers. Fortunately there's no signs of infection. That is worsening infection at least indefinitely nothing systemic. With that being said he is having a lot of drainage though not quite as much is there in his last evaluation. Overall feel like he's on the side of improvement. I think if his results back from his culture which showed that he had a positive group B strep along with abundant Pseudomonas noted on the culture. For that reason I am gonna have him continue with the linezolid as we previously have ordered for him and I did go ahead as well today and prescribe Levaquin as well in order to treat the Pseudomonas portion of the infection noted. 03/24/18 on evaluation today patient actually appears to be doing very well in regard to his lower Trinity ulcer. He's been tolerating the dressing changes without complication. Fortunately both legs show signs of less drainage in his edema is very well controlled at this point as well. Overall very pleased with how things seem to be progressing. 03/31/18 on evaluation today patient actually appears to be doing excellent in regard to his bilateral lower extremity ulcers. These are not draining nearly as significant as what they were in the past overall seem to be shown signs of excellent improvement which is great news. Fortunately there is no sign of active infection at this time I do believe that the Levaquin has done extremely well for him in this regard. The patient continues to change these at home typically every day. We may be able to slowly work towards every other day changes since the drainage seems to be slowing down  quite significantly. 04/14/18 on evaluation today patient appears to be doing well in regard to his bilateral lower extremities. Let me Hilda Blades Almost completely healed which is excellent news. Fortunately he's shown signs of improvement all other sites as well with new skin growth there's some slight hyper granular tissue but for the most part this seems to be well maintained with the [facility] Dressing. I'm very happy in this regard. 04/28/18 on evaluation today patient appears to be doing rather well in regard to his ulcers of the bilateral lower extremities all things considering. He continues to make some progress as far as new skin growth. There still some hyper granulation noted  at this point despite the use of the [facility] Dressing. This is not terrible but I think we may want to consider conclude cauterization today with silver nitrate to try to help knock some of his back as well as helping with any biofilm on the surface of the wound. 05/12/18 on evaluation today patient's wounds actually appear to be doing fairly well in regard to the bilateral lower extremities. He's been tolerating the dressing changes without complication. Fortunately there's no signs of active infection at this time which is good news. Overall very pleased with how things seem to be progressing. You select silver nitrate was beneficial for him. 05/26/18 on evaluation today patient appears to be doing better in regard to left lower extremity and a little bit worse in regard to the right lower extremity. He states that he was pulling off the [facility] Dressing peel back some of the skin making this area significantly larger than what it was previous. He's not had any issues other than this and states even that hasn't caused any pain he just seems to obviously have a much larger area on the right when compared to the previous time I saw him. No fevers, chills, nausea, or vomiting noted at this time. 06/16/18  on evaluation today patient actually appears to be doing a little better in my pinion in regard to his lower summary ulcers. He has new skin islands that seem to be spreading which is good news. Fortunately there's no signs of active infection at this time. His biggest issue is he tells me that coming as often as he does is becoming very cost prohibitive. He wonders if we can potentially spread this out. 07/14/18 on evaluation today patient appears to be doing a little bit worse in regard to his lower from the ulcer. Unfortunately he still continues to have a significant amount of drainage I think we need to do something to try to help this more. He is still somewhat reluctant to go the route of the Wound VAC although that may be the most appropriate thing for him. No fevers, chills, nausea, or vomiting noted at this time. 08/18/2018 on evaluation today patient actually appears to be doing quite well with regard to his bilateral lower extremity ulcers. I do feel like that currently he is making great progress the care max does seem to be doing a great job at helping to control the moisture he has no maceration or skin breakdown. Again this seems to be an excellent way to go. 1 thing we may want to change is adding collagen to the base of the wound and then the care max over top he is not opposed to this. 09/15/2018 on evaluation today patient appears to be doing well with regard to his bilateral lower extremity ulcers. He is showing some signs of improvement not necessarily in size but definitely in appearance. In fact he has a lot of new skin growing throughout the wounds along the edges as well as in the central portion of the wounds on both lower extremities. Overall I am extremely pleased to see this. 10/20/2018 on evaluation today patient actually appears to be doing quite well with regard to his wounds. They are not measuring significantly smaller but he does have a lot of new epithelization noted  as compared to previous. Fortunately there is no signs of active infection at this time. No fevers, chills, nausea, vomiting, or diarrhea. 11/17/2018 on evaluation today patient presents for reevaluation concerning his bilateral  lower extremity ulcers. Fortunately there is no signs of active infection at this time today. He has been tolerating the dressing changes without complication. No fevers, chills, nausea, vomiting, or diarrhea. Unfortunately in general the patient has not made as much improvement as I would like to have seen up to this point. He has been tolerating the dressing changes without complication and he does an excellent job taking care of his wounds at home in my opinion. The biggest issue I see is that he is just not making the progress that we need to be seeing currently. I think we may want to consider having him seen at a plastic surgery appointment and he has previously seen someone in years past at [facility] [place]. That is definitely a possibility for [place] to look into at this point. Electronic Signature(s) Signed: 11/17/2018 6:48:11 PM By: Worthy Keeler PA-C Entered By: Worthy Keeler on 11/17/2018 14:02:42 -------------------------------------------------------------------------------- Physical Exam Details Patient Name: Date of Service: Carlos White, Carlos White 11/17/2018 12:30 PM Medical Record [id number] Patient Account Number: [id number] Date of Birth/Sex: Treating RN: July 05, 1940 (78 y.o. Male) Baruch Gouty Primary Care Provider: [name] Other Clinician: Referring Provider: Treating Provider/Extender:Stone III, Sharen Hint, MARY Weeks in Treatment: 76 Constitutional Well-nourished and well-hydrated in no acute distress. Respiratory normal breathing without difficulty. clear to auscultation bilaterally. Cardiovascular regular rate and rhythm with normal S1, S2. Psychiatric this patient is able to make decisions and  demonstrates good insight into disease process. Alert and Oriented x 3. pleasant and cooperative. Notes Upon inspection patient's wounds currently show some no signs of active infection at this point. He has been tolerating the compression wraps without complication and overall I am very pleased with how things appear in that regard. With that being said he still has a significant area of open wounds on the bilateral lower extremities and were quite a ways into his treatment without seeing enough improvement to make me feel comfortable in continuing just with what were doing. Electronic Signature(s) Signed: 11/17/2018 6:48:11 PM By: Worthy Keeler PA-C Entered By: Worthy Keeler on 11/17/2018 14:04:06 -------------------------------------------------------------------------------- Physician Orders Details Patient Name: Date of Service: [name] 11/17/2018 12:30 PM Medical Record [id number] Patient Account Number: [id number] Date of Birth/Sex: Treating RN: [birth year] (78 y.o. Male) Baruch Gouty Primary Care Provider: [name] Other Clinician: Referring Provider: Treating Provider/Extender:Stone III, Sharen Hint, MARY Weeks in Treatment: 51 Verbal / Phone Orders: No Diagnosis Coding ICD-10 Coding Code Description I87.2 Venous insufficiency (chronic) (peripheral) Q81.8 Other epidermolysis bullosa L97.322 Non-pressure chronic ulcer of left ankle with fat layer exposed L97.822 Non-pressure chronic ulcer of other part of left lower leg with fat layer exposed L97.812 Non-pressure chronic ulcer of other part of right lower leg with fat layer exposed [place] (primary) hypertension Follow-up Appointments Return appointment in 1 month. Dressing Change Frequency Wound #2 Left,Distal,Lateral Lower Leg Change Dressing every other day. Wound #5 Right,Proximal,Lateral Lower Leg Change Dressing every other day. Skin Barriers/Peri-Wound Care Barrier cream - to  periwound areas Moisturizing lotion - both legs with dressing changes Wound Cleansing Wound #2 Left,Distal,Lateral Lower Leg May shower and wash wound with soap and water. - with dressing changes Wound #5 Right,Proximal,Lateral Lower Leg May shower and wash wound with soap and water. - with dressing changes Primary Wound Dressing Wound #2 Left,Distal,Lateral Lower Leg Collagen - fibracol - do not need to moisten Wound #5 Right,Proximal,Lateral Lower Leg Collagen - fibracol - do not need to moisten Secondary Dressing Wound #  2 Left,Distal,Lateral Lower Leg Kerlix/Rolled Gauze ABD pad - all wounds Kerramax - or xtrasorb Wound #5 Right,Proximal,Lateral Lower Leg Kerlix/Rolled Gauze ABD pad - all wounds Kerramax Edema Control Avoid standing for long periods of time Elevate legs to the level of the heart or above for 30 minutes daily and/or when sitting, a frequency of: - throughout the day Support Garment 20-30 mm/Hg pressure to: - patient to apply juxtalites daily to both legs over ace wraps Other: - ACE wrap both legs Consults Plastic Surgery - Consult Dr. Vernona Rieger at [facility] to evaluate bilateral chronic lower leg wounds Laboratory Bacteria identified in Unspecified specimen by Anaerobe culture (MICRO) - right lower leg LOINC Code: [id number] Convenience Name: Anerobic culture Patient Medications Allergies: No Known Drug Allergies Notifications Medication Indication Start End doxycycline hyclate 11/24/2018 DOSE 1 - oral 100 mg capsule - 1 capsule oral taken 2 times a day for 14 days. Do not take multivitamin with this Cipro 11/24/2018 DOSE 1 - oral 500 mg tablet - 1 tablet oral taken 2 times a day for 14 days. Do not take multivitamin with this Electronic Signature(s) Signed: 11/24/2018 12:13:51 PM By: Worthy Keeler PA-C Previous Signature: 11/17/2018 6:42:58 PM Version By: Baruch Gouty RN, BSN Previous Signature: 11/17/2018 6:48:11 PM Version By: Worthy Keeler  PA-C Entered By: Worthy Keeler on 11/24/2018 12:13:48 -------------------------------------------------------------------------------- Prescription 11/17/2018 Patient Name: [name] Provider: Worthy Keeler PA Date of Birth: 05/14/1940 NPI#: [id number] Sex: Male DEA#: [id number] Phone #: [phone] License #: Patient Address: [place] [address] [place], [place] [address] [place], [place] [address] [phone] Allergies No Known Drug Allergies Provider's Orders Plastic Surgery - Consult Dr. Vernona Rieger at [facility] to evaluate bilateral chronic lower leg wounds Signature(s): Date(s): Electronic Signature(s) Signed: 11/24/2018 5:02:35 PM By: Worthy Keeler PA-C Previous Signature: 11/17/2018 6:42:58 PM Version By: Baruch Gouty RN, BSN Previous Signature: 11/17/2018 6:48:11 PM Version By: Worthy Keeler PA-C Entered By: Worthy Keeler on 11/24/2018 12:13:52 --------------------------------------------------------------------------------  Problem List Details Patient Name: Date of Service: [name] 11/17/2018 12:30 PM Medical Record [id number] Patient Account Number: [id number] Date of Birth/Sex: Treating RN: September 09, 1940 (78 y.o. 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/30/2017 No Yes exposed L97.822 Non-pressure chronic ulcer of other part of left lower 09/30/2017 No Yes leg with fat layer exposed L97.812 Non-pressure chronic ulcer of other part of right lower 09/30/2017 No  Yes leg with fat layer exposed [place] (primary) hypertension 09/30/2017 No Yes Inactive Problems Resolved Problems Electronic Signature(s) Signed: 11/17/2018 6:48:11 PM By: Worthy Keeler PA-C Entered By: Worthy Keeler on 11/17/2018 13:34:39 -------------------------------------------------------------------------------- Progress Note Details Patient Name: Date of Service: [name] 11/17/2018 12:30 PM Medical Record [id number] Patient Account Number: [id number] Date of Birth/Sex: Treating RN: Jul 31, 1940 (78 y.o. Male) Baruch Gouty Primary Care Provider: [name] Other Clinician: Referring Provider: Treating Provider/Extender:Stone III, Sharen Hint, MARY Weeks in Treatment: 65 Subjective Chief Complaint Information obtained from Patient Bilateral LE Ulcers History of Present Illness (HPI) 09/30/17 on evaluation today patient presents for initial evaluation and our clinic concerning issues that he has been  having with his bilateral lower extremities. He states this has been going on for quite some time at least six months. Currently his regiment has been mainly cleaning the area with peroxide, applying the is foreign ointment, and wrapping the area with ABD pads and then an ace wrap loosely. He has dealt with issues of this nature he tells me for quite some time. He does have a history of having had a compound fracture of the left lower extremity which he thinks also makes this a much more difficult area for him to heal. He's previously been told that he had poor vascular flow but this was years ago at [facility] we do not have any of those records at this time. He has a history of Epidermolysis Bullosa which was diagnosed around age 69 and he has been cared for at [facility] since that time. Subsequently he states this is hereditary and two of his children one male and one male also have this as well is one of his grandchildren that he is aware of. He has no  evidence of infection necessarily at this point although he does have some necrotic tissue noted on the surface of the wound as far as the largest, left lateral lower extremity ulcer, is concerned. Overall I feel like all things considered he's been taking care of this very well. Obviously he has some fairly significant issues going on at this point in this regard. He does have a history otherwise of hypertension though for the most part other than the compound fracture of the left leg he seems to have been fairly healthy in my pinion. 10/07/17 on evaluation today patient actually appears to be doing better in regard to his bilateral lower extremity ulcers. With that being said he does still have some evidence of slough noted on the surface of the wounds I think the Iodoflex has been beneficial for him. His arterial studies are scheduled for October 2. With that being said I do believe that he is continuing to show signs of good improvement which is at least good news. 10/14/17 on evaluation today patient appears to be doing very well in regard to his lower extremity ulcers. He's definitely made some progress as far as healing is concerned although there still are several open areas that are going to need to be addressed. He did have his arterial study today which fortunately shows good findings with a right ABI of 1.23 with a TBI of 0.86 in the left ABI of 1.28 with a TBI of 0.81. This is good news and will allow [place] to perform debridement as well. 10/23/2017; patient with a large wound on the left lateral calf, sizable area on the left medial malleolus and an area on the right lateral malleolus. He has a new blister consistent with his underlying blistering skin disease just above this area we have been using Iodoflex on the lateral left calf lateral right ankle and collagen on the medial left ankle. We have been using Kerlix Coban wraps 10/28/17 on evaluation today the patient continues to have  signs of improvement in regard to the overall appearance of the original wound. Unfortunately he did have some blistering over the right lateral lower extremity which has appeared to rupture on evaluation today and likely some of the dead tissue on the surface needs to be cleaned away the good news is this does not appear to be to significantly deep at this time. 11/04/17 on evaluation today patient actually appears to be doing  a little better in regard to his lower extremity ulcers. He has been tolerating the dressing changes without complication. With that being said he does still have a significant wound especially over the left lateral lower extremity unfortunately. All of the wounds pretty much are going to require sharp debridement today. 11/11/17 on evaluation today patient appears to be doing more poorly in regard to his left lower extremity in particular. There does not appear to be any evidence of systemic infection although the wound itself as far as the larger left lateral lower extremity ulcer actually appears to be infected in my pinion. There's an older and the surface of the wound is dramatically worsened compared to last week. No fevers, chills, nausea, or vomiting noted at this time. 11/18/17 upon evaluation today patient actually appears to be doing better. I did review his culture today which really did not show any specific organism is a positive reason for his wound decline. There are multiple organisms present not predominant. Nonetheless he seems to be tolerate the doxycycline well in his wounds in general do seem to be doing better. Fortunately there does not appear to be any evidence of infection at this time which is good news. Overall I'm very pleased with how things appear. Nonetheless he still has a lot of healing to go. I do think he could benefit from a Juxta-Lite wrap. 11/25/17 on evaluation today patient actually appears to be doing fairly well in regard to his  wounds. He is still taking the antibiotics he has a few days left. Fortunately this seems to have been excellent for him as far as getting the infection control and very happy in this regard. With that being said the patient likewise is also very pleased with how things appear at this time in comparison to where we were he's not having as much pain. 12/02/17 Seen today for follow p and management of LLE wounds. Wounds appear to show some improvement. He denies pain, fever, or chills. Completed a course of doxycycline earlier this month. Scheduled to received Juxta-Lite wrap this week. No s/s of infections. 12/09/17 on evaluation today patient actually appears to be doing a little bit better in regard to his wounds. This is obscene very slow process and unfortunately he has a couple of new areas and this is due to the Epidermolysis Bullosa. Nonetheless I am concerned about the fact that he seems to be getting more areas not less that is the reason we're gonna work on getting schedule for the vascular referral to see the venous specialist. 12/23/17 upon evaluation today patient's wounds currently shows evidence of still not doing quite as well is what I would like to have seen. Subsequently the patient did have his venous study which showed evidence of venous stasis. Subsequently I do think that a vascular evaluation for consideration of venous intervention would be appropriate. I'm not necessarily suggesting that will be anything that can be done but I think it is at least a good idea. He is in agreement with this plan. 12/30/17 on evaluation today patient actually appears to be doing very well in regard to his wounds when compared to previous evaluation. Subsequently we have been using the [facility] Dressing which actually appears to have done excellent on his left lateral lower extremity ulcer. The quality of the wound surface is dramatically improved. There is some slight debridement that  is going to be required at a couple of locations but overall I'm extremely pleased with how things  appear here. 01/07/2018; this is a patient with a primary skin disorder epidermolyis bullosa. Is a large wound on the left lateral calf and smaller wounds on the right however there is a new wound on the right mid tibia area that occurred within the compression wrap that he did not change. We have been using Hydrofera Blue. On the left he is using Hydrofera Blue and Santyl to the inferior part of the wound and changing the dressing himself. 01/15/2018; primary skin disorder epidermolysis bullosa. He has several difficult wounds including the left lateral calf, smaller wounds on the left medial calf and the right lateral calf. The major area on the left lateral calf has a smaller area inferiorly that has necrotic debris we have been using Santyl to this. The rest of the wounds we have been using Hydrofera Blue. The area on the left calf actually looks larger this week. Uncontrolled edema several small open areas above it that are superficial 01/20/18 on evaluation today patient appears to be doing better as compared to last week in regard to his wounds of the bilateral lower extremities. He tolerated the bilateral compression wrap without complication. Overall I'm very pleased with how things appear at this time. The patient likewise is very happy. 01/27/18 on evaluation today patient appears to be doing decently well in regard to his bilateral lower extremity ulcers. He's been tolerating the dressing changes without complication. One issue he had is that he did have more drainage to the left leg wrapped last week. He states in fact he probably should come in and let us change it on Friday however he just left it in place and kept adding extra absorption with ABD pads to the external portion of the wrap. Unfortunately he does have some aspiration type breakdown nothing significant but I do believe that this  was probably counterproductive in general. Nonetheless his wounds do not appear to be terrible overall. 02/03/18 on evaluation today patient appears to be doing rather well in regard to his lower extremity ulcers. He has been tolerating the dressing changes without complication. He does tell me that he had to change the wrap on the left one since we last saw him. Subsequently I do not see any evidence of infection I do feel like the food was much better controlled at this point. 02/10/18 on evaluation today patient appears to be doing rather well in regard to his ulcers. He still has significant alterations especially on the left lateral lower extremity. Fortunately there's no signs of infection at this time. Overall I feel like he is making good progress are some areas that I'm gonna attempt some debridement today. 02/17/18 on evaluation today patient appears to be doing okay in regard to his lower Trinity ulcer. It does appear on both locations he has a little bit of drainage causing some breakdown in maceration around the wound bed's although it doesn't appear to be too bad the right is a little bit worse than left. Fortunately there's no signs of infection which is good news. No fevers, chills, nausea, or vomiting noted at this time. 03/10/18 on evaluation today patient actually appears to be doing rather poorly in regard to his bilateral lower extremity ulcers. The right in particular is draining profusely and the wound is actually enlarging which is not good. I'm concerned about both possibly infection and the fact that there's a lot of moisture which is causing breakdown as well. Unfortunately the patient has been trying to change this at home  I'm afraid he may need to change more frequently in order to see the improvement that were looking for. There's no signs of systemic infection. 03/17/18 patient actually appears to be doing significantly better at this point in regard to his bilateral lower  extremity ulcers. Fortunately there's no signs of infection. That is worsening infection at least indefinitely nothing systemic. With that being said he is having a lot of drainage though not quite as much is there in his last evaluation. Overall feel like he's on the side of improvement. I think if his results back from his culture which showed that he had a positive group B strep along with abundant Pseudomonas noted on the culture. For that reason I am gonna have him continue with the linezolid as we previously have ordered for him and I did go ahead as well today and prescribe Levaquin as well in order to treat the Pseudomonas portion of the infection noted. 03/24/18 on evaluation today patient actually appears to be doing very well in regard to his lower Trinity ulcer. He's been tolerating the dressing changes without complication. Fortunately both legs show signs of less drainage in his edema is very well controlled at this point as well. Overall very pleased with how things seem to be progressing. 03/31/18 on evaluation today patient actually appears to be doing excellent in regard to his bilateral lower extremity ulcers. These are not draining nearly as significant as what they were in the past overall seem to be shown signs of excellent improvement which is great news. Fortunately there is no sign of active infection at this time I do believe that the Levaquin has done extremely well for him in this regard. The patient continues to change these at home typically every day. We may be able to slowly work towards every other day changes since the drainage seems to be slowing down quite significantly. 04/14/18 on evaluation today patient appears to be doing well in regard to his bilateral lower extremities. Let me [facility] Almost completely healed which is excellent news. Fortunately he's shown signs of improvement all other sites as well with new skin growth there's some slight hyper granular  tissue but for the most part this seems to be well maintained with the [facility] Dressing. I'm very happy in this regard. 04/28/18 on evaluation today patient appears to be doing rather well in regard to his ulcers of the bilateral lower extremities all things considering. He continues to make some progress as far as new skin growth. There still some hyper granulation noted at this point despite the use of the [facility] Dressing. This is not terrible but I think we may want to consider conclude cauterization today with silver nitrate to try to help knock some of his back as well as helping with any biofilm on the surface of the wound. 05/12/18 on evaluation today patient's wounds actually appear to be doing fairly well in regard to the bilateral lower extremities. He's been tolerating the dressing changes without complication. Fortunately there's no signs of active infection at this time which is good news. Overall very pleased with how things seem to be progressing. You select silver nitrate was beneficial for him. 05/26/18 on evaluation today patient appears to be doing better in regard to left lower extremity and a little bit worse in regard to the right lower extremity. He states that he was pulling off the [facility] Dressing peel back some of the skin making this area significantly larger than what  it was previous. He's not had any issues other than this and states even that hasn't caused any pain he just seems to obviously have a much larger area on the right when compared to the previous time I saw him. No fevers, chills, nausea, or vomiting noted at this time. 06/16/18 on evaluation today patient actually appears to be doing a little better in my pinion in regard to his lower summary ulcers. He has new skin islands that seem to be spreading which is good news. Fortunately there's no signs of active infection at this time. His biggest issue is he tells me that coming as often as he  does is becoming very cost prohibitive. He wonders if we can potentially spread this out. 07/14/18 on evaluation today patient appears to be doing a little bit worse in regard to his lower from the ulcer. Unfortunately he still continues to have a significant amount of drainage I think we need to do something to try to help this more. He is still somewhat reluctant to go the route of the Wound VAC although that may be the most appropriate thing for him. No fevers, chills, nausea, or vomiting noted at this time. 08/18/2018 on evaluation today patient actually appears to be doing quite well with regard to his bilateral lower extremity ulcers. I do feel like that currently he is making great progress the care max does seem to be doing a great job at helping to control the moisture he has no maceration or skin breakdown. Again this seems to be an excellent way to go. 1 thing we may want to change is adding collagen to the base of the wound and then the care max over top he is not opposed to this. 09/15/2018 on evaluation today patient appears to be doing well with regard to his bilateral lower extremity ulcers. He is showing some signs of improvement not necessarily in size but definitely in appearance. In fact he has a lot of new skin growing throughout the wounds along the edges as well as in the central portion of the wounds on both lower extremities. Overall I am extremely pleased to see this. 10/20/2018 on evaluation today patient actually appears to be doing quite well with regard to his wounds. They are not measuring significantly smaller but he does have a lot of new epithelization noted as compared to previous. Fortunately there is no signs of active infection at this time. No fevers, chills, nausea, vomiting, or diarrhea. 11/17/2018 on evaluation today patient presents for reevaluation concerning his bilateral lower extremity ulcers. Fortunately there is no signs of active infection at this time  today. He has been tolerating the dressing changes without complication. No fevers, chills, nausea, vomiting, or diarrhea. Unfortunately in general the patient has not made as much improvement as I would like to have seen up to this point. He has been tolerating the dressing changes without complication and he does an excellent job taking care of his wounds at home in my opinion. The biggest issue I see is that he is just not making the progress that we need to be seeing currently. I think we may want to consider having him seen at a plastic surgery appointment and he has previously seen someone in years past at [facility] in [place]. That is definitely a possibility for [place] to look into at this point. Patient History Information obtained from Patient. Family History Stroke - Maternal Grandparents, No family history of Cancer, Diabetes, Heart Disease,  Hereditary Spherocytosis, Hypertension, Kidney Disease, Lung Disease, Seizures, Thyroid Problems, Tuberculosis. Social History Former smoker - quit 1986, Marital Status - Married, Alcohol Use - Never, Drug Use - No History, Caffeine Use - Daily - coffee. Medical History Eyes Patient has history of Cataracts Denies history of Glaucoma, Optic Neuritis Ear/Nose/Mouth/Throat Denies history of Chronic sinus problems/congestion, Middle ear problems Hematologic/Lymphatic Denies history of Anemia, Hemophilia, Human Immunodeficiency Virus, Lymphedema, Sickle Cell Disease Respiratory Patient has history of Chronic Obstructive Pulmonary Disease (COPD) Denies history of Aspiration, Asthma, Pneumothorax, Sleep Apnea, Tuberculosis Cardiovascular Patient has history of Hypertension Denies history of Angina, Arrhythmia, Congestive Heart Failure, Coronary Artery Disease, Deep Vein Thrombosis, Hypotension, Myocardial Infarction, Peripheral Arterial Disease, Peripheral Venous Disease, Phlebitis, Vasculitis Gastrointestinal Denies  history of Cirrhosis , Colitis, Crohnoos, Hepatitis A, Hepatitis B, Hepatitis C Endocrine Denies history of Type I Diabetes, Type II Diabetes Genitourinary Denies history of End Stage Renal Disease Immunological Denies history of Lupus Erythematosus, Raynaudoos, Scleroderma Integumentary (Skin) Denies history of History of Burn Musculoskeletal Denies history of Gout, Rheumatoid Arthritis, Osteoarthritis, Osteomyelitis Neurologic Denies history of Dementia, Neuropathy, Quadriplegia, Paraplegia, Seizure Disorder Oncologic Denies history of Received Chemotherapy, Received Radiation Psychiatric Denies history of Anorexia/bulimia, Confinement Anxiety Review of Systems (ROS) Constitutional Symptoms (General Health) Denies complaints or symptoms of Fatigue, Fever, Chills, Marked Weight Change. Respiratory Denies complaints or symptoms of Chronic or frequent coughs, Shortness of Breath. Cardiovascular Denies complaints or symptoms of Chest pain. Psychiatric Denies complaints or symptoms of Claustrophobia, Suicidal. Objective Constitutional Well-nourished and well-hydrated in no acute distress. Vitals Time Taken: 1:15 PM, Height: 71 in, Weight: 220 lbs, BMI: 30.7, Temperature: 98.1 F, Pulse: 63 bpm, Respiratory Rate: 18 breaths/min, Blood Pressure: 147/72 mmHg. Respiratory normal breathing without difficulty. clear to auscultation bilaterally. Cardiovascular regular rate and rhythm with normal S1, S2. Psychiatric this patient is able to make decisions and demonstrates good insight into disease process. Alert and Oriented x 3. pleasant and cooperative. General Notes: Upon inspection patient's wounds currently show some no signs of active infection at this point. He has been tolerating the compression wraps without complication and overall I am very pleased with how things appear in that regard. With that being said he still has a significant area of open wounds on the bilateral  lower extremities and were quite a ways into his treatment without seeing enough improvement to make me feel comfortable in continuing just with what were doing. Integumentary (Hair, Skin) Wound #2 status is Open. Original cause of wound was Gradually Appeared. The wound is located on the Left,Distal,Lateral Lower Leg. The wound measures 16cm length x 14cm width x 0.1cm depth; 175.929cm^2 area and 17.593cm^3 volume. There is Fat Layer (Subcutaneous Tissue) Exposed exposed. There is no tunneling or undermining noted. There is a medium amount of serosanguineous drainage noted. The wound margin is flat and intact. There is large (67-100%) red, hyper - granulation within the wound bed. There is a small (1-33%) amount of necrotic tissue within the wound bed including Adherent Slough. Wound #5 status is Open. Original cause of wound was Gradually Appeared. The wound is located on the Right,Proximal,Lateral Lower Leg. The wound measures 10.2cm length x 12cm width x 0.1cm depth; 96.133cm^2 area and 9.613cm^3 volume. There is Fat Layer (Subcutaneous Tissue) Exposed exposed. There is no tunneling or undermining noted. There is a medium amount of serosanguineous drainage noted. The wound margin is flat and intact. There is large (67-100%) red, hyper - granulation within the wound bed. There is no necrotic tissue within the  wound bed. 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 Dressing Change Frequency: Wound #2 Left,Distal,Lateral Lower Leg: Change Dressing every other day. Wound #5 Right,Proximal,Lateral Lower  Leg: Change Dressing every other day. Skin Barriers/Peri-Wound Care: Barrier cream - to periwound areas Moisturizing lotion - both legs with dressing changes Wound Cleansing: Wound #2 Left,Distal,Lateral Lower Leg: May shower and wash wound with soap and water. - with dressing changes Wound #5 Right,Proximal,Lateral Lower Leg: May shower and wash wound with soap and water. - with dressing changes Primary Wound Dressing: Wound #2 Left,Distal,Lateral Lower Leg: Collagen - fibracol - do not need to moisten Wound #5 Right,Proximal,Lateral Lower Leg: Collagen - fibracol - do not need to moisten Secondary Dressing: Wound #2 Left,Distal,Lateral Lower Leg: Kerlix/Rolled Gauze ABD pad - all wounds Kerramax - or xtrasorb Wound #5 Right,Proximal,Lateral Lower Leg: Kerlix/Rolled Gauze ABD pad - all wounds Kerramax Edema Control: Avoid standing for long periods of time Elevate legs to the level of the heart or above for 30 minutes daily and/or when sitting, a frequency of: - throughout the day Support Garment 20-30 mm/Hg pressure to: - patient to apply juxtalites daily to both legs over ace wraps Other: - ACE wrap both legs Consults ordered were: Plastic Surgery - Consult Dr. Vernona Rieger at [facility] to evaluate bilateral chronic lower leg wounds Laboratory ordered were: Anerobic culture - right lower leg The following medication(s) was prescribed: doxycycline hyclate oral 100 mg capsule 1 1 capsule oral taken 2 times a day for 14 days. Do not take multivitamin with this starting 11/24/2018 Cipro oral 500 mg tablet 1 1 tablet oral taken 2 times a day for 14 days. Do not take multivitamin with this starting 11/24/2018 1 at this point I suggested to the patient that we see about making an appointment at the plastic surgery department at [facility] and he is in agreement with this plan. 2. I am also going to suggest that we continue at this point with the current  wound care measures we have tried multiple items in the past and again nothing seems to really do any better I feel like he is at least showing some signs of improvement in some areas although in other areas it looks like he just seems to continue to expand as far as the wounds are concerned. 3. I did obtain a wound culture today to evaluate for possibility of infection considering the wounds are larger if anything shows up in this regard we will initiate treatment as necessary. We will see patient back for reevaluation in 1 week here in the clinic. If anything worsens or changes patient will contact our office for additional recommendations. Addendum: I did review patient's wound culture today. Unfortunately he was positive for Pseudomonas, MRSA, and Klebsiella. Unfortunately the Pseudomonas and Klebsiella will be covered by Cipro but the MRSA was resistant. For that reason he is going require 2 different antibiotics at this point I did send in a prescription for Cipro and doxycycline for him. Electronic Signature(s) Signed: 11/24/2018 5:02:35 PM By: Worthy Keeler PA-C Previous Signature: 11/17/2018 6:48:11 PM Version By: Worthy Keeler PA-C Entered By: Worthy Keeler on 11/24/2018 12:14:38 --------------------------------------------------------------------------------  HxROS Details Patient Name: Date of Service: Carlos White, Carlos White 11/17/2018 12:30 PM Medical Record [id number] Patient Account Number: [id number] Date of Birth/Sex: Treating RN: 1940-11-01 (78 y.o. Male) Baruch Gouty Primary Care Provider: [name] Other Clinician: Referring Provider: Treating Provider/Extender:Stone III, Sharen Hint, MARY Weeks in Treatment: 62 Information Obtained From Patient Constitutional Symptoms (General Health) Complaints and Symptoms: Negative for: Fatigue; Fever; Chills; Marked Weight Change Respiratory Complaints and Symptoms: Negative for: Chronic or frequent coughs;  Shortness of Breath Medical History: Positive for: Chronic Obstructive Pulmonary Disease (COPD) Negative for: Aspiration; Asthma; Pneumothorax; Sleep Apnea; Tuberculosis Cardiovascular Complaints and Symptoms: Negative for: Chest pain Medical History: Positive for: Hypertension Negative for: Angina; Arrhythmia; Congestive Heart Failure; Coronary Artery Disease; Deep Vein Thrombosis; Hypotension; Myocardial Infarction; Peripheral Arterial Disease; Peripheral Venous Disease; Phlebitis; Vasculitis Psychiatric Complaints and Symptoms: Negative for: Claustrophobia; Suicidal Medical History: Negative for: Anorexia/bulimia; Confinement Anxiety Eyes Medical History: Positive for: Cataracts Negative for: Glaucoma; Optic Neuritis Ear/Nose/Mouth/Throat Medical History: Negative for: Chronic sinus problems/congestion; Middle ear problems Hematologic/Lymphatic Medical History: Negative for: Anemia; Hemophilia; Human Immunodeficiency Virus; Lymphedema; Sickle Cell Disease Gastrointestinal Medical History: Negative for: Cirrhosis ; Colitis; Crohns; Hepatitis A; Hepatitis B; Hepatitis C Endocrine Medical History: Negative for: Type I Diabetes; Type II Diabetes Genitourinary Medical History: Negative for: End Stage Renal Disease Immunological Medical History: Negative for: Lupus Erythematosus; Raynauds; Scleroderma Integumentary (Skin) Medical History: Negative for: History of Burn Musculoskeletal Medical History: Negative for: Gout; Rheumatoid Arthritis; Osteoarthritis; Osteomyelitis Neurologic Medical History: Negative for: Dementia; Neuropathy; Quadriplegia; Paraplegia; Seizure Disorder Oncologic Medical History: Negative for: Received Chemotherapy; Received Radiation HBO Extended History Items Eyes: Cataracts Immunizations Pneumococcal Vaccine: Received Pneumococcal Vaccination: No Implantable Devices No devices added Family and Social History Cancer: No; Diabetes: No;  Heart Disease: No; Hereditary Spherocytosis: No; Hypertension: No; Kidney Disease: No; Lung Disease: No; Seizures: No; Stroke: Yes - Maternal Grandparents; Thyroid Problems: No; Tuberculosis: No; Former smoker - quit 1986; Marital Status - Married; Alcohol Use: Never; Drug Use: No History; Caffeine Use: Daily - coffee; Financial Concerns: No; Food, Clothing or Shelter Needs: No; Support System Lacking: No; Transportation Concerns: No Physician Affirmation I have reviewed and agree with the above information. Electronic Signature(s) Signed: 11/17/2018 6:42:58 PM By: Baruch Gouty RN, BSN Signed: 11/17/2018 6:48:11 PM By: Worthy Keeler PA-C Entered By: Worthy Keeler on 11/17/2018 14:03:32 -------------------------------------------------------------------------------- SuperBill Details Patient Name: Date of Service: [name] 11/17/2018 Medical Record [id number] Patient Account Number: [id number] Date of Birth/Sex: Treating RN: 01/14/1941 (78 y.o. 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 L97.822 Non-pressure chronic ulcer of other part of left lower leg with fat layer exposed L97.812 Non-pressure chronic ulcer of other part of right lower leg with fat layer exposed [place] (primary) hypertension Facility Procedures CPT4 Code: [id number] Description: 99214 - WOUND CARE VISIT-LEV 4 EST PT Modifier: Quantity: 1 Physician Procedures CPT4 Code Description: [id number] - WC PHYS LEVEL 4 - EST PT ICD-10 Diagnosis Description I87.2 Venous insufficiency (chronic) (peripheral) 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

## 2018-11-22 ENCOUNTER — Other Ambulatory Visit: Payer: Self-pay | Admitting: Internal Medicine

## 2018-11-22 LAB — AEROBIC/ANAEROBIC CULTURE W GRAM STAIN (SURGICAL/DEEP WOUND)

## 2018-11-22 LAB — AEROBIC/ANAEROBIC CULTURE (SURGICAL/DEEP WOUND)

## 2018-12-15 ENCOUNTER — Encounter (HOSPITAL_BASED_OUTPATIENT_CLINIC_OR_DEPARTMENT_OTHER): Payer: Medicare Other | Admitting: Physician Assistant

## 2018-12-21 NOTE — Progress Notes (Signed)
Carlos White, Carlos White ([id number]) Visit Report for 10/20/2018 Arrival Information Details Patient Name: Date of Service: Carlos White, Carlos White 10/20/2018 12:30 PM Medical Record [id number] Patient Account Number: [id number] Date of Birth/Sex: Treating RN: 11-Jan-1941 (78 y.o. Male) Carlene Coria Primary Care Annais Crafts: [name] Other Clinician: Referring Alisea Matte: Treating Almando Brawley/Extender:Stone III, Sharen Hint, MARY Weeks in Treatment: 50 Visit Information History Since Last Visit All ordered tests and consults were completed: No Patient Arrived: Ambulatory Added or deleted any medications: No Arrival Time: 13:05 Any new allergies or adverse reactions: No Accompanied By: self Had a fall or experienced change in No Transfer Assistance: None activities of daily living that may affect Patient Identification Verified: Yes risk of falls: Secondary Verification Process Yes Signs or symptoms of abuse/neglect since last No Completed: visito Patient Requires Transmission-Based No Hospitalized since last visit: No Precautions: Implantable device outside of the clinic excluding No Patient Has Alerts: Yes cellular tissue based products placed in the center Patient Alerts: R ABI= 1.23, TBI since last visit: = .86 Has Dressing in Place as Prescribed: Yes L ABI= 1.28, Has Compression in Place as Prescribed: Yes TBI=.81 Pain Present Now: No Electronic Signature(s) Signed: 12/21/2018 2:59:38 PM By: Carlene Coria RN Entered By: Carlene Coria on 10/20/2018 13:08:19 -------------------------------------------------------------------------------- Clinic Level of Care Assessment Details Patient Name: Date of Service: Carlos White, Carlos White 10/20/2018 12:30 PM Medical Record [id number] Patient Account Number: [id number] Date of Birth/Sex: Treating RN: 1940-05-15 (78 y.o. Male) Baruch Gouty Primary Care Gayle Collard: [name] Other Clinician: Referring Raylee Adamec: Treating  Cartier Washko/Extender:Stone III, Sharen Hint, MARY Weeks in Treatment: 26 Clinic Level of Care Assessment Items TOOL 4 Quantity Score []  - Use when only an EandM is performed on FOLLOW-UP visit 0 ASSESSMENTS - Nursing Assessment / Reassessment X - Reassessment of Co-morbidities (includes updates in patient status) 1 10 X - Reassessment of Adherence to Treatment Plan 1 5 ASSESSMENTS - Wound and Skin Assessment / Reassessment []  - Simple Wound Assessment / Reassessment - one wound 0 X - Complex Wound Assessment / Reassessment - multiple wounds 2 5 []  - Dermatologic / Skin Assessment (not related to wound area) 0 ASSESSMENTS - Focused Assessment X - Circumferential Edema Measurements - multi extremities 2 5 []  - Nutritional Assessment / Counseling / Intervention 0 X - Lower Extremity Assessment (monofilament, tuning fork, pulses) 1 5 []  - Peripheral Arterial Disease Assessment (using hand held doppler) 0 ASSESSMENTS - Ostomy and/or Continence Assessment and Care []  - Incontinence Assessment and Management 0 []  - Ostomy Care Assessment and Management (repouching, etc.) 0 PROCESS - Coordination of Care X - Simple Patient / Family Education for ongoing care 1 15 []  - Complex (extensive) Patient / Family Education for ongoing care 0 X - Staff obtains Programmer, systems, Records, Test Results / Process Orders 1 10 []  - Staff telephones HHA, Nursing Homes / Clarify orders / etc 0 []  - Routine Transfer to another Facility (non-emergent condition) 0 []  - Routine Hospital Admission (non-emergent condition) 0 []  - New Admissions / Biomedical engineer / Ordering NPWT, Apligraf, etc. 0 []  - Emergency Hospital Admission (emergent condition) 0 X - Simple Discharge Coordination 1 10 []  - Complex (extensive) Discharge Coordination 0 PROCESS - Special Needs []  - Pediatric / Minor Patient Management 0 []  - Isolation Patient Management 0 []  - Hearing / Language / Visual special needs 0 []  - Assessment of  Community assistance (transportation, D/C planning, etc.) 0 []  - Additional assistance / Altered mentation 0 []  - Support Surface(s) Assessment (bed, cushion, seat, etc.) 0  INTERVENTIONS - Wound Cleansing / Measurement []  - Simple Wound Cleansing - one wound 0 X - Complex Wound Cleansing - multiple wounds 2 5 X - Wound Imaging (photographs - any number of wounds) 1 5 []  - Wound Tracing (instead of photographs) 0 []  - Simple Wound Measurement - one wound 0 X - Complex Wound Measurement - multiple wounds 2 5 INTERVENTIONS - Wound Dressings []  - Small Wound Dressing one or multiple wounds 0 X - Medium Wound Dressing one or multiple wounds 2 15 []  - Large Wound Dressing one or multiple wounds 0 X - Application of Medications - topical 1 5 []  - Application of Medications - injection 0 INTERVENTIONS - Miscellaneous []  - External ear exam 0 []  - Specimen Collection (cultures, biopsies, blood, body fluids, etc.) 0 []  - Specimen(s) / Culture(s) sent or taken to Lab for analysis 0 []  - Patient Transfer (multiple staff / Civil Service fast streamer / Similar devices) 0 []  - Simple Staple / Suture removal (25 or less) 0 []  - Complex Staple / Suture removal (26 or more) 0 []  - Hypo / Hyperglycemic Management (close monitor of Blood Glucose) 0 []  - Ankle / Brachial Index (ABI) - do not check if billed separately 0 X - Vital Signs 1 5 Has the patient been seen at the hospital within the last three years: Yes Total Score: 140 Level Of Care: New/Established - Level 4 Electronic Signature(s) Signed: 10/21/2018 12:09:12 PM By: Baruch Gouty RN, BSN Entered By: Baruch Gouty on 10/20/2018 13:33:19 -------------------------------------------------------------------------------- Encounter Discharge Information Details Patient Name: Date of Service: Carlos White 10/20/2018 12:30 PM Medical Record [id number] Patient Account Number: [id number] Date of Birth/Sex: Treating RN: Dec 16, 1940 (78 y.o. Male) [name] Primary Care Chizara Mena: [name] Other Clinician: Referring Jessicalynn Deshong: Treating Llewyn Heap/Extender:Stone III, Sharen Hint, MARY Weeks in Treatment: 13 Encounter Discharge Information Items Discharge Condition: Stable Ambulatory Status: Ambulatory Discharge Destination: Home Transportation: Private Auto Accompanied By: self Schedule Follow-up Appointment: Yes Clinical Summary of Care: Electronic Signature(s) Signed: 10/20/2018 6:07:42 PM By: [name] Entered By: [name] on 10/20/2018 14:09:30 -------------------------------------------------------------------------------- Lower Extremity Assessment Details Patient Name: Date of Service: Carlos White, Carlos White 10/20/2018 12:30 PM Medical Record [id number] Patient Account Number: [id number] Date of Birth/Sex: Treating RN: April 04, 1940 (78 y.o. Male) Carlene Coria Primary Care Deeksha Cotrell: [name] Other Clinician: Referring Mirza Fessel: Treating Suleika Donavan/Extender:Stone III, Sharen Hint, MARY Weeks in Treatment: 55 Edema Assessment Assessed: [Left: No] [Right: No] Edema: [Left: Yes] [Right: Yes] Calf Left: Right: Point of Measurement: 33 cm From Medial Instep 36 cm 38 cm Ankle Left: Right: Point of Measurement: 12 cm From Medial Instep 26 cm 27 cm Electronic Signature(s) Signed: 12/21/2018 2:59:38 PM By: Carlene Coria RN Entered By: Carlene Coria on 10/20/2018 13:15:58 -------------------------------------------------------------------------------- [place] Details Patient Name: Date of Service: Carlos White 10/20/2018 12:30 PM Medical Record [id number] Patient Account Number: [id number] Date of Birth/Sex: Treating RN: May 20, 1940 (78 y.o. Male) Baruch Gouty Primary Care Nazaiah Navarrete: [name] Other Clinician: Referring Tine Mabee: Treating Deandrea Rion/Extender:Stone III, Sharen Hint, MARY Weeks in Treatment: 53 Active Inactive Venous Leg Ulcer Nursing Diagnoses: Knowledge deficit  related to disease process and management Potential for venous Insuffiency (use before diagnosis confirmed) Goals: Patient will maintain optimal edema control Date Initiated: 09/30/2017 Target Resolution Date: 11/17/2018 Goal Status: Active Patient/caregiver will verbalize understanding of disease process and disease management Date Inactivated: 11/04/2017 Target Resolution Date Initiated: 09/30/2017 Date: 10/30/2017 Goal Status: Met Interventions: Assess peripheral edema status every visit. Provide education on venous insufficiency Notes: Wound/Skin Impairment  Nursing Diagnoses: Impaired tissue integrity Knowledge deficit related to ulceration/compromised skin integrity Goals: Patient/caregiver will verbalize understanding of skin care regimen Date Initiated: 09/30/2017 Target Resolution Date: 11/17/2018 Goal Status: Active Ulcer/skin breakdown will have a volume reduction of 30% by week 4 Date Inactivated: 11/04/2017 Target Resolution Date Initiated: 09/30/2017 Date: 10/30/2017 Goal Status: Met Interventions: Assess patient/caregiver ability to obtain necessary supplies Assess patient/caregiver ability to perform ulcer/skin care regimen upon admission and as needed Assess ulceration(s) every visit Provide education on ulcer and skin care Notes: Electronic Signature(s) Signed: 10/21/2018 12:09:12 PM By: Baruch Gouty RN, BSN Entered By: Baruch Gouty on 10/20/2018 13:22:29 -------------------------------------------------------------------------------- Pain Assessment Details Patient Name: Date of Service: Carlos White 10/20/2018 12:30 PM Medical Record [id number] Patient Account Number: [id number] Date of Birth/Sex: Treating RN: October 18, 1940 (78 y.o. Male) Carlene Coria Primary Care Dawid Dupriest: [name] Other Clinician: Referring Jing Howatt: Treating Draya Felker/Extender:Stone III, Sharen Hint, MARY Weeks in Treatment: 46 Active Problems Location of Pain Severity  and Description of Pain Patient Has Paino No Site Locations Pain Management and Medication Current Pain Management: Electronic Signature(s) Signed: 12/21/2018 2:59:38 PM By: Carlene Coria RN Entered By: Carlene Coria on 10/20/2018 13:09:15 -------------------------------------------------------------------------------- Patient/Caregiver Education Details Patient Name: Carlos White 10/7/2020andnbsp12:30 Date of Service: PM Medical Record [id number] Number: Patient Account Number: [id number] Treating RN: Date of Birth/Gender: 1940-11-19 (78 y.o. Baruch Gouty Male) Other Clinician: Primary Care Treating Laurann [place] Physician: Physician/Extender: Referring Physician: Assunta Gambles in Treatment: 33 Education Assessment Education Provided To: Patient Education Topics Provided Venous: Methods: Explain/Verbal Responses: Reinforcements needed, State content correctly Wound/Skin Impairment: Methods: Explain/Verbal Responses: Reinforcements needed, State content correctly Electronic Signature(s) Signed: 10/21/2018 12:09:12 PM By: Baruch Gouty RN, BSN Entered By: Baruch Gouty on 10/20/2018 13:22:48 -------------------------------------------------------------------------------- Wound Assessment Details Patient Name: Date of Service: Carlos White 10/20/2018 12:30 PM Medical Record [id number] Patient Account Number: [id number] Date of Birth/Sex: Treating RN: [birth year] (78 y.o. Male) Carlene Coria Primary Care Bronislaw Switzer: [name] Other Clinician: Referring Dinorah Masullo: Treating Tanicia Wolaver/Extender:Stone III, Sharen Hint, MARY Weeks in Treatment: 55 Wound Status Wound Number: 2 Primary Venous Leg Ulcer Etiology: Wound Location: Left Lower Leg - Lateral, Distal Wound Open Wounding Event: Gradually Appeared Status: Date Acquired: 03/13/2017 Comorbid Cataracts, Chronic Obstructive Pulmonary Weeks Of Treatment: 35 History: Disease (COPD),  Hypertension Clustered Wound: No Photos Wound Measurements Length: (cm) 15 % Reduct Width: (cm) 12.3 % Reduct Depth: (cm) 0.1 Epitheli Area: (cm) 144.906 Tunneli Volume: (cm) 14.491 Undermi Wound Description Classification: Full Thickness Without Exposed Support Foul Odo Structures Slough/F Wound Flat and Intact Margin: Exudate Medium Amount: Exudate Serosanguineous Type: Exudate red, brown Color: Wound Bed Granulation Amount: Large (67-100%) Granulation Quality: Red, Hyper-granulation Fascia E Necrotic Amount: Small (1-33%) Fat Laye Necrotic Quality: Adherent Slough Tendon E Muscle E Joint Ex Bone Exp r After Cleansing: No ibrino No Exposed Structure xposed: No r (Subcutaneous Tissue) Exposed: Yes xposed: No xposed: No posed: No osed: No ion in Area: -86.9% ion in Volume: 62.6% alization: Small (1-33%) ng: No ning: No Electronic Signature(s) Signed: 10/21/2018 1:38:24 PM By: Mikeal Hawthorne EMT/HBOT Signed: 12/21/2018 2:59:38 PM By: Carlene Coria RN Entered By: Mikeal Hawthorne on 10/21/2018 10:33:24 -------------------------------------------------------------------------------- Wound Assessment Details Patient Name: Date of Service: Carlos White 10/20/2018 12:30 PM Medical Record [id number] Patient Account Number: [id number] Date of Birth/Sex: Treating RN: [birth year] (78 y.o. Male) Carlene Coria Primary Care Chevie Birkhead: [name] Other Clinician: Referring Hailea Eaglin: Treating Charmaine Placido/Extender:Stone III, Sharen Hint, MARY Weeks in Treatment: 55 Wound Status Wound Number: 5 Primary Vasculopathy  Etiology: Wound Location: Right Lower Leg - Lateral, Proximal Wound Open Status: Wounding Event: Gradually Appeared Comorbid Cataracts, Chronic Obstructive Pulmonary Date Acquired: 10/26/2017 History: Disease (COPD), Hypertension Weeks Of Treatment: 51 Clustered Wound: No Photos Wound Measurements Length: (cm) 11 % Reduct Width: (cm) 10.5 %  Reduct Depth: (cm) 0.1 Epitheli Area: (cm) 90.713 Tunneli Volume: (cm) 9.071 Undermi Wound Description Classification: Full Thickness Without Exposed Support Foul Od Structures Slough/ Wound Flat and Intact Margin: Exudate Medium Amount: Exudate Serosanguineous Type: Exudate red, brown Color: Wound Bed Granulation Amount: Large (67-100%) Granulation Quality: Red, Hyper-granulation Fascia E Necrotic Amount: None Present (0%) Fat Laye Tendon E Muscle E Joint Ex Bone Exp or After Cleansing: No Fibrino No Exposed Structure xposed: No r (Subcutaneous Tissue) Exposed: Yes xposed: No xposed: No posed: No osed: No ion in Area: -67.1% ion in Volume: -67.1% alization: Small (1-33%) ng: No ning: No Electronic Signature(s) Signed: 10/21/2018 1:38:24 PM By: Mikeal Hawthorne EMT/HBOT Signed: 12/21/2018 2:59:38 PM By: Carlene Coria RN Entered By: Mikeal Hawthorne on 10/21/2018 10:27:43 -------------------------------------------------------------------------------- Vitals Details Patient Name: Date of Service: Carlos White 10/20/2018 12:30 PM Medical Record [id number] Patient Account Number: [id number] Date of Birth/Sex: Treating RN: Nov 13, 1940 (78 y.o. Male) Carlene Coria Primary Care Kirsta Probert: [name] Other Clinician: Referring Terriah Reggio: Treating Zakyria Metzinger/Extender:Stone III, Sharen Hint, MARY Weeks in Treatment: 55 Vital Signs Time Taken: 13:08 Temperature (F): 97.8 Height (in): 71 Pulse (bpm): 63 Weight (lbs): 220 Respiratory Rate (breaths/min): 18 Body Mass Index (BMI): 30.7 Blood Pressure (mmHg): 152/62 Reference Range: 80 - 120 mg / dl Electronic Signature(s) Signed: 12/21/2018 2:59:38 PM By: Carlene Coria RN Entered By: Carlene Coria on 10/20/2018 13:09:05

## 2018-12-21 NOTE — Progress Notes (Signed)
DARY, DILAURO ([id number]) Visit Report for 08/18/2018 Arrival Information Details Patient Name: Date of Service: Calahan, Pak 08/18/2018 12:30 PM Medical Record [id number] Patient Account Number: [id number] Date of Birth/Sex: Treating RN: 1940/03/07 (77 y.o. Male) Carlene Coria Primary Care Burk Hoctor: [name] Other Clinician: Referring Carmelina Balducci: Treating Sherrina Zaugg/Extender:Stone III, Sharen Hint, MARY Weeks in Treatment: 21 Visit Information History Since Last Visit All ordered tests and consults were completed: No Patient Arrived: Ambulatory Added or deleted any medications: No Arrival Time: 12:51 Any new allergies or adverse reactions: No Accompanied By: self Had a fall or experienced change in No Transfer Assistance: None activities of daily living that may affect Patient Identification Verified: Yes risk of falls: Secondary Verification Process Yes Signs or symptoms of abuse/neglect since last No Completed: visito Patient Requires Transmission-Based No Hospitalized since last visit: No Precautions: Implantable device outside of the clinic excluding No Patient Has Alerts: Yes cellular tissue based products placed in the center Patient Alerts: R ABI= 1.23, TBI since last visit: = .86 Has Dressing in Place as Prescribed: Yes L ABI= 1.28, Pain Present Now: No TBI=.81 Electronic Signature(s) Signed: 12/21/2018 3:07:45 PM By: Carlene Coria RN Entered By: Carlene Coria on 08/18/2018 12:52:26 -------------------------------------------------------------------------------- Clinic Level of Care Assessment Details Patient Name: Date of Service: Delfino, Friesen 08/18/2018 12:30 PM Medical Record [id number] Patient Account Number: [id number] Date of Birth/Sex: Treating RN: May 11, 1940 (77 y.o. Male) Baruch Gouty Primary Care Akbar Sacra: [name] Other Clinician: Referring Elodie Panameno: Treating Norvella Loscalzo/Extender:Stone III, Sharen Hint, MARY Weeks in  Treatment: 10 Clinic Level of Care Assessment Items TOOL 4 Quantity Score _0  - Use when only an EandM is performed on FOLLOW-UP visit 0 ASSESSMENTS - Nursing Assessment / Reassessment X - Reassessment of Co-morbidities (includes updates in patient status) 1 10 X - Reassessment of Adherence to Treatment Plan 1 5 ASSESSMENTS - Wound and Skin Assessment / Reassessment _1  - Simple Wound Assessment / Reassessment - one wound 0 X - Complex Wound Assessment / Reassessment - multiple wounds 2 5 _2  - Dermatologic / Skin Assessment (not related to wound area) 0 ASSESSMENTS - Focused Assessment _3  - Circumferential Edema Measurements - multi extremities 0 _4  - Nutritional Assessment / Counseling / Intervention 0 X - Lower Extremity Assessment (monofilament, tuning fork, pulses) 1 5 _5  - Peripheral Arterial Disease Assessment (using hand held doppler) 0 ASSESSMENTS - Ostomy and/or Continence Assessment and Care _6  - Incontinence Assessment and Management 0 _7  - Ostomy Care Assessment and Management (repouching, etc.) 0 PROCESS - Coordination of Care X - Simple Patient / Family Education for ongoing care 1 15 _8  - Complex (extensive) Patient / Family Education for ongoing care 0 X - Staff obtains Programmer, systems, Records, Test Results / Process Orders 1 10 _9  - Staff telephones HHA, Nursing Homes / Clarify orders / etc 0 _10  - Routine Transfer to another Facility (non-emergent condition) 0 _11  - Routine Hospital Admission (non-emergent condition) 0 _12  - New Admissions / Biomedical engineer / Ordering NPWT, Apligraf, etc. 0 _13  - Emergency Hospital Admission (emergent condition) 0 X - Simple Discharge Coordination 1 10 _14  - Complex (extensive) Discharge Coordination 0 PROCESS - Special Needs _15  - Pediatric / Minor Patient Management 0 _16  - Isolation Patient Management 0 _17  - Hearing / Language / Visual special needs 0 _18  - Assessment of Community assistance (transportation, D/C planning, etc.) 0 _19  -  Additional assistance / Altered mentation 0 _20  - Support Surface(s) Assessment (bed, cushion, seat, etc.) 0 INTERVENTIONS - Wound Cleansing / Measurement _21  -  Simple Wound Cleansing - one wound 0 X - Complex Wound Cleansing - multiple wounds 2 5 X - Wound Imaging (photographs - any number of wounds) 1 5 _0  - Wound Tracing (instead of photographs) 0 _1  - Simple Wound Measurement - one wound 0 X - Complex Wound Measurement - multiple wounds 2 5 INTERVENTIONS - Wound Dressings _2  - Small Wound Dressing one or multiple wounds 0 X - Medium Wound Dressing one or multiple wounds 2 15 _3  - Large Wound Dressing one or multiple wounds 0 X - Application of Medications - topical 1 5 <[id number]>_1<\/[id number]>_4  - Application of Medications - injection 0 INTERVENTIONS - Miscellaneous _5  - External ear exam 0 _6  - Specimen Collection (cultures, biopsies, blood, body fluids, etc.) 0 _7  - Specimen(s) / Culture(s) sent or taken to Lab for analysis 0 _8  - Patient Transfer (multiple staff / Civil Service fast streamer / Similar devices) 0 _9  - Simple Staple / Suture removal (25 or less) 0 _10  - Complex Staple / Suture removal (26 or more) 0 _11  - Hypo / Hyperglycemic Management (close monitor of Blood Glucose) 0 _12  - Ankle / Brachial Index (ABI) - do not check if billed separately 0 X - Vital Signs 1 5 Has the patient been seen at the hospital within the last three years: Yes Total Score: 130 Level Of Care: New/Established - Level 4 Electronic Signature(s) Signed: 08/18/2018 5:54:42 PM By: Baruch Gouty RN, BSN Entered By: Baruch Gouty on 08/18/2018 13:41:09 -------------------------------------------------------------------------------- Encounter Discharge Information Details Patient Name: Date of Service: [name] 08/18/2018 12:30 PM Medical Record [id number] Patient Account Number: [id number] Date of Birth/Sex: Treating RN: [birth year] (77 y.o. Male) [name] Primary Care Briella Hobday: [name] Other  Clinician: Referring Dalana Pfahler: Treating Denorris Reust/Extender:Stone III, Sharen Hint, MARY Weeks in Treatment: 36 Encounter Discharge Information Items Discharge Condition: Stable Ambulatory Status: Ambulatory Discharge Destination: Home Transportation: Private Auto Accompanied By: self Schedule Follow-up Appointment: Yes Clinical Summary of Care: Electronic Signature(s) Signed: 08/18/2018 5:49:28 PM By: [name] Entered By: [name] on 08/18/2018 15:06:34 -------------------------------------------------------------------------------- Lower Extremity Assessment Details Patient Name: Date of Service: Winson, Eichorn 08/18/2018 12:30 PM Medical Record [id number] Patient Account Number: [id number] Date of Birth/Sex: Treating RN: 1940/08/05 (77 y.o. Male) Baruch Gouty Primary Care Nayleen Janosik: [name] Other Clinician: Referring Daphnie Venturini: Treating Heavan Francom/Extender:Stone III, Sharen Hint, MARY Weeks in Treatment: 46 Edema Assessment Assessed: [Left: No] [Right: No] Edema: [Left: Yes] [Right: Yes] Calf Left: Right: Point of Measurement: 33 cm From Medial Instep 33 cm 36 cm Ankle Left: Right: Point of Measurement: 12 cm From Medial Instep 26 cm 25 cm Vascular Assessment Pulses: Dorsalis Pedis Palpable: [Left:Yes] [Right:Yes] Electronic Signature(s) Signed: 08/18/2018 5:54:42 PM By: Baruch Gouty RN, BSN Entered By: Baruch Gouty on 08/18/2018 13:28:49 -------------------------------------------------------------------------------- Multi-Disciplinary Care Plan Details Patient Name: Date of Service: [name] 08/18/2018 12:30 PM Medical Record [id number] Patient Account Number: [id number] Date of Birth/Sex: Treating RN: [birth year] (77 y.o. Male) Baruch Gouty Primary Care Emerlyn Mehlhoff: [name] Other Clinician: Referring Joandy Burget: Treating Zarin Hagmann/Extender:Stone III, Sharen Hint, MARY Weeks in Treatment: 45 Active Inactive Venous Leg  Ulcer Nursing Diagnoses: Knowledge deficit related to disease process and management Potential for venous Insuffiency (use before diagnosis confirmed) Goals: Patient will maintain optimal edema control Date Initiated: 09/30/2017 Target Resolution Date: 09/08/2018 Goal Status: Active Patient/caregiver will verbalize understanding of disease process and disease management Date Inactivated: 11/04/2017 Target10/18/2019 Resolution Date Initiated: 09/30/2017 Date: Goal Status: Met Interventions: Assess peripheral edema status every visit. Provide education on venous insufficiency Notes: Wound/Skin Impairment  Nursing Diagnoses: Impaired tissue integrity Knowledge deficit related to ulceration/compromised skin integrity Goals: Patient/caregiver will verbalize understanding of skin care regimen Date Initiated: 09/30/2017 Target Resolution Date: 09/08/2018 Goal Status: Active Ulcer/skin breakdown will have a volume reduction of 30% by week 4 Target Resolution Date Initiated: 09/30/2017 Date Inactivated: 11/04/2017 Date: 10/30/2017 Goal Status: Met Interventions: Assess patient/caregiver ability to obtain necessary supplies Assess patient/caregiver ability to perform ulcer/skin care regimen upon admission and as needed Assess ulceration(s) every visit Provide education on ulcer and skin care Notes: Electronic Signature(s) Signed: 08/18/2018 5:54:42 PM By: Baruch Gouty RN, BSN Entered By: Baruch Gouty on 08/18/2018 13:40:06 -------------------------------------------------------------------------------- Pain Assessment Details Patient Name: Date of Service: [name] 08/18/2018 12:30 PM Medical Record [id number] Patient Account Number: [id number] Date of Birth/Sex: Treating RN: [birth year] (77 y.o. Male) Carlene Coria Primary Care Greydis Stlouis: [name] Other Clinician: Referring Janyah Singleterry: Treating Ferron Ishmael/Extender:Stone III, Sharen Hint, MARY Weeks in Treatment:  71 Active Problems Location of Pain Severity and Description of Pain Patient Has Paino No Site Locations Pain Management and Medication Current Pain Management: Electronic Signature(s) Signed: 12/21/2018 3:07:45 PM By: Carlene Coria RN Entered By: Carlene Coria on 08/18/2018 12:55:52 -------------------------------------------------------------------------------- Patient/Caregiver Education Details Patient Name: [name] 8/5/2020andnbsp12:30 Date of Service: PM Medical Record [id number] Number: Patient Account Number: [id number] Treating RN: September 01, 1940 (78 y.o. Baruch Gouty Date of Birth/Gender: Male) Other Clinician: Primary Care Physician: [name] Treating Worthy Keeler Referring Physician: Physician/Extender: Assunta Gambles in Treatment: 12 Education Assessment Education Provided To: Patient Education Topics Provided Venous: Methods: Explain/Verbal Responses: Reinforcements needed, State content correctly Wound/Skin Impairment: Methods: Explain/Verbal Responses: Reinforcements needed, State content correctly Electronic Signature(s) Signed: 08/18/2018 5:54:42 PM By: Baruch Gouty RN, BSN Entered By: Baruch Gouty on 08/18/2018 13:40:30 -------------------------------------------------------------------------------- Wound Assessment Details Patient Name: Date of Service: [name] 08/18/2018 12:30 PM Medical Record [id number] Patient Account Number: [id number] Date of Birth/Sex: Treating RN: 1940/10/27 (77 y.o. Male) Carlene Coria Primary Care Yeng Frankie: [name] Other Clinician: Referring Elchonon Maxson: Treating Kyden Potash/Extender:Stone III, Sharen Hint, MARY Weeks in Treatment: 46 Wound Status Wound Number: 16 Primary Trauma, Other Etiology: Wound Location: Right Lower Leg - Medial Wound Open Wounding Event: Trauma Status: Date Acquired: 08/17/2018 Comorbid Cataracts, Chronic Obstructive Pulmonary Weeks Of Treatment: 0 History:  Disease (COPD), Hypertension Clustered Wound: No Photos Wound Measurements Length: (cm) 5 % Reduct Width: (cm) 2.2 % Reduct Depth: (cm) 0.1 Epitheli Area: (cm) 8.639 Tunneli Volume: (cm) 0.864 Undermi Wound Description Full Thickness Without Exposed Support Foul Od Classification: Structures Slough/ Exudate Small Amount: Exudate Serosanguineous Type: Exudate red, brown Color: Wound Bed Granulation Amount: None Present (0%) Fascia E Fat Laye Tendon E Muscle E Joint Ex Bone Exp or After Cleansing: No Fibrino No Exposed Structure xposed: No r (Subcutaneous Tissue) Exposed: No xposed: No xposed: No posed: No osed: No ion in Area: 0% ion in Volume: 0% alization: None ng: No ning: No Electronic Signature(s) Signed: 08/19/2018 4:03:04 PM By: Mikeal Hawthorne Signed: 12/21/2018 3:07:45 PM By: Carlene Coria RN Entered By: Mikeal Hawthorne on 08/19/2018 08:55:51 -------------------------------------------------------------------------------- Wound Assessment Details Patient Name: Date of Service: [name] 08/18/2018 12:30 PM Medical Record [id number] Patient Account Number: [id number] Date of Birth/Sex: Treating RN: May 03, 1940 (77 y.o. Male) Carlene Coria Primary Care Daritza Brees: [name] Other Clinician: Referring Jevon Littlepage: Treating Brett Darko/Extender:Stone III, Sharen Hint, MARY Weeks in Treatment: 46 Wound Status Wound Number: 2 Primary Venous Leg Ulcer Etiology: Wound Location: Left Lower Leg - Lateral, Distal Wound Open Wounding Event: Gradually Appeared Status: Date Acquired: 03/13/2017  Comorbid Cataracts, Chronic Obstructive Pulmonary Weeks Of Treatment: 46 History: Disease (COPD), Hypertension Clustered Wound: No Photos Wound Measurements Length: (cm) 13.5 % Reduct Width: (cm) 11.5 % Reduct Depth: (cm) 0.1 Epitheli Area: (cm) 121.933 Tunneli Volume: (cm) 12.193 Undermi Wound Description Full Thickness Without Exposed Support Foul  Odo Classification: Structures Slough/F Wound Flat and Intact Margin: Exudate Large Amount: Exudate Serosanguineous Type: Exudate red, brown Color: Wound Bed Granulation Amount: Large (67-100%) Granulation Quality: Red, Hyper-granulation Fascia E Necrotic Amount: Small (1-33%) Fat Laye Necrotic Quality: Adherent Slough Tendon E Muscle E Joint Ex Bone Exp r After Cleansing: No ibrino No Exposed Structure xposed: No r (Subcutaneous Tissue) Exposed: Yes xposed: No xposed: No posed: No osed: No ion in Area: -57.3% ion in Volume: 68.5% alization: Small (1-33%) ng: No ning: No Electronic Signature(s) Signed: 08/19/2018 4:03:04 PM By: Mikeal Hawthorne Signed: 12/21/2018 3:07:45 PM By: Carlene Coria RN Entered By: Mikeal Hawthorne on 08/19/2018 08:56:17 -------------------------------------------------------------------------------- Wound Assessment Details Patient Name: Date of Service: [name] 08/18/2018 12:30 PM Medical Record [id number] Patient Account Number: [id number] Date of Birth/Sex: Treating RN: 11-18-1940 (77 y.o. Male) Carlene Coria Primary Care Kaia Depaolis: [name] Other Clinician: Referring Hailly Fess: Treating Meckenzie Balsley/Extender:Stone III, Sharen Hint, MARY Weeks in Treatment: 46 Wound Status Wound Number: 5 Primary Vasculopathy Etiology: Wound Location: Right Lower Leg - Lateral, Proximal Wound Open Status: Wounding Event: Gradually Appeared Comorbid Cataracts, Chronic Obstructive Pulmonary Date Acquired: 10/26/2017 History: Disease (COPD), Hypertension Weeks Of Treatment: 42 Clustered Wound: No Photos Wound Measurements Length: (cm) 11 % Reduct Width: (cm) 11.7 % Reduct Depth: (cm) 0.1 Epitheli Area: (cm) 101.081 Tunneli Volume: (cm) 10.108 Undermi Wound Description Classification: Full Thickness Without Exposed Support Foul Odo Structures Slough/F Wound Flat and Intact Margin: Exudate Large Amount: Exudate  Serosanguineous Type: Exudate red, brown Color: Wound Bed Granulation Amount: Large (67-100%) Granulation Quality: Red, Hyper-granulation Fascia E Necrotic Amount: None Present (0%) Fat Laye Tendon E Muscle E Joint Ex Bone Exposed r After Cleansing: No ibrino No Exposed Structure xposed: No r (Subcutaneous Tissue) Exposed: Yes xposed: No xposed: No posed: No : No ion in Area: -86.2% ion in Volume: -86.2% alization: Small (1-33%) ng: No ning: No Electronic Signature(s) Signed: 08/19/2018 4:03:04 PM By: Mikeal Hawthorne Signed: 12/21/2018 3:07:45 PM By: Carlene Coria RN Entered By: Mikeal Hawthorne on 08/19/2018 08:55:23 -------------------------------------------------------------------------------- Vitals Details Patient Name: Date of Service: [name] 08/18/2018 12:30 PM Medical Record [id number] Patient Account Number: [id number] Date of Birth/Sex: Treating RN: May 11, 1940 (77 y.o. Male) Carlene Coria Primary Care Jarreau Callanan: [name] Other Clinician: Referring Matix Henshaw: Treating Lakelynn Severtson/Extender:Stone III, Sharen Hint, MARY Weeks in Treatment: 46 Vital Signs Time Taken: 12:52 Temperature (F): 98.2 Height (in): 71 Pulse (bpm): 73 Weight (lbs): 220 Respiratory Rate (breaths/min): 18 Body Mass Index (BMI): 30.7 Blood Pressure (mmHg): 164/65 Reference Range: 80 - 120 mg / dl Electronic Signature(s) Signed: 12/21/2018 3:07:45 PM By: Carlene Coria RN Entered By: Carlene Coria on 08/18/2018 12:52:54

## 2018-12-22 ENCOUNTER — Ambulatory Visit (HOSPITAL_BASED_OUTPATIENT_CLINIC_OR_DEPARTMENT_OTHER): Payer: Medicare Other | Admitting: Physician Assistant

## 2018-12-22 NOTE — Progress Notes (Signed)
CHAZZ, PHILSON ([id number]) Visit Report for 11/17/2018 Arrival Information Details Patient Name: Date of Service: Carlos White, Carlos White 11/17/2018 12:30 PM Medical Record [id number] Patient Account Number: [id number] Date of Birth/Sex: Treating RN: November 28, 1940 (78 y.o. Male) Carlene Coria Primary Care Brysen Shankman: [name] Other Clinician: Referring Silena Wyss: Treating Verle Brillhart/Extender:Stone III, Sharen Hint, MARY Weeks in Treatment: 39 Visit Information History Since Last Visit All ordered tests and consults were completed: No Patient Arrived: Ambulatory Added or deleted any medications: No Arrival Time: 13:15 Any new allergies or adverse reactions: No Accompanied By: self Had a fall or experienced change in No Transfer Assistance: None activities of daily living that may affect Patient Requires Transmission-Based No risk of falls: Precautions: Signs or symptoms of abuse/neglect since last No Patient Has Alerts: Yes visito Patient Alerts: R ABI= 1.23, TBI Hospitalized since last visit: No = .86 Implantable device outside of the clinic excluding No L ABI= 1.28, cellular tissue based products placed in the center TBI=.81 since last visit: Has Dressing in Place as Prescribed: Yes Has Compression in Place as Prescribed: Yes Pain Present Now: No Electronic Signature(s) Signed: 12/22/2018 12:05:18 PM By: Carlene Coria RN Entered By: Carlene Coria on 11/17/2018 13:15:40 -------------------------------------------------------------------------------- Clinic Level of Care Assessment Details Patient Name: Date of Service: Carlos White, Carlos White 11/17/2018 12:30 PM Medical Record [id number] Patient Account Number: [id number] Date of Birth/Sex: Treating RN: [birth year] (78 y.o. Male) Baruch Gouty Primary Care Amberle Lyter: [name] Other Clinician: Referring Alonso Gapinski: Treating Lavontae Cornia/Extender:Stone III, Sharen Hint, MARY Weeks in Treatment: 44 Clinic Level of Care Assessment  Items TOOL 4 Quantity Score _0  - Use when only an EandM is performed on FOLLOW-UP visit 0 ASSESSMENTS - Nursing Assessment / Reassessment X - Reassessment of Co-morbidities (includes updates in patient status) 1 10 X - Reassessment of Adherence to Treatment Plan 1 5 ASSESSMENTS - Wound and Skin Assessment / Reassessment _1  - Simple Wound Assessment / Reassessment - one wound 0 X - Complex Wound Assessment / Reassessment - multiple wounds 2 5 _2  - Dermatologic / Skin Assessment (not related to wound area) 0 ASSESSMENTS - Focused Assessment X - Circumferential Edema Measurements - multi extremities 2 5 _3  - Nutritional Assessment / Counseling / Intervention 0 X - Lower Extremity Assessment (monofilament, tuning fork, pulses) 1 5 _4  - Peripheral Arterial Disease Assessment (using hand held doppler) 0 ASSESSMENTS - Ostomy and/or Continence Assessment and Care _5  - Incontinence Assessment and Management 0 _6  - Ostomy Care Assessment and Management (repouching, etc.) 0 PROCESS - Coordination of Care X - Simple Patient / Family Education for ongoing care 1 15 _7  - Complex (extensive) Patient / Family Education for ongoing care 0 X - Staff obtains Programmer, systems, Records, Test Results / Process Orders 1 10 _8  - Staff telephones HHA, Nursing Homes / Clarify orders / etc 0 _9  - Routine Transfer to another Facility (non-emergent condition) 0 _10  - Routine Hospital Admission (non-emergent condition) 0 _11  - New Admissions / Biomedical engineer / Ordering NPWT, Apligraf, etc. 0 _12  - Emergency Hospital Admission (emergent condition) 0 X - Simple Discharge Coordination 1 10 _13  - Complex (extensive) Discharge Coordination 0 PROCESS - Special Needs _14  - Pediatric / Minor Patient Management 0 _15  - Isolation Patient Management 0 _16  - Hearing / Language / Visual special needs 0 _17  - Assessment of Community assistance (transportation, D/C planning, etc.) 0 _18  - Additional assistance / Altered mentation  0 _19  - Support Surface(s) Assessment (bed, cushion, seat, etc.) 0 INTERVENTIONS - Wound Cleansing / Measurement _20  - Simple  Wound Cleansing - one wound 0 X - Complex Wound Cleansing - multiple wounds 2 5 X - Wound Imaging (photographs - any number of wounds) 1 5 _0  - Wound Tracing (instead of photographs) 0 _1  - Simple Wound Measurement - one wound 0 X - Complex Wound Measurement - multiple wounds 2 5 INTERVENTIONS - Wound Dressings _2  - Small Wound Dressing one or multiple wounds 0 X - Medium Wound Dressing one or multiple wounds 2 15 _3  - Large Wound Dressing one or multiple wounds 0 X - Application of Medications - topical 1 5 <[id number]>_2<\/[id number]>_7  - Application of Medications - injection 0 INTERVENTIONS - Miscellaneous _5  - External ear exam 0 _6  - Specimen Collection (cultures, biopsies, blood, body fluids, etc.) 0 _7  - Specimen(s) / Culture(s) sent or taken to Lab for analysis 0 _8  - Patient Transfer (multiple staff / Civil Service fast streamer / Similar devices) 0 _9  - Simple Staple / Suture removal (25 or less) 0 _10  - Complex Staple / Suture removal (26 or more) 0 _11  - Hypo / Hyperglycemic Management (close monitor of Blood Glucose) 0 _12  - Ankle / Brachial Index (ABI) - do not check if billed separately 0 X - Vital Signs 1 5 Has the patient been seen at the hospital within the last three years: Yes Total Score: 140 Level Of Care: New/Established - Level 4 Electronic Signature(s) Signed: 11/17/2018 6:42:58 PM By: Baruch Gouty RN, BSN Entered By: Baruch Gouty on 11/17/2018 14:02:46 -------------------------------------------------------------------------------- Encounter Discharge Information Details Patient Name: Date of Service: Carlos White 11/17/2018 12:30 PM Medical Record [id number] Patient Account Number: [id number] Date of Birth/Sex: Treating RN: Oct 25, 1940 (78 y.o. Male) [name] Primary Care Brittane Grudzinski: [name] Other Clinician: Referring Brandye Inthavong: Treating  Denika Krone/Extender:Stone III, Sharen Hint, MARY Weeks in Treatment: 18 Encounter Discharge Information Items Discharge Condition: Stable Ambulatory Status: Ambulatory Discharge Destination: Home Transportation: Private Auto Accompanied By: self Schedule Follow-up Appointment: Yes Clinical Summary of Care: Electronic Signature(s) Signed: 11/17/2018 6:30:51 PM By: [name] Entered By: [name] on 11/17/2018 14:29:24 -------------------------------------------------------------------------------- Lower Extremity Assessment Details Patient Name: Date of Service: Carlos White, Carlos White 11/17/2018 12:30 PM Medical Record [id number] Patient Account Number: [id number] Date of Birth/Sex: Treating RN: 1940-02-24 (78 y.o. Male) Carlene Coria Primary Care Jacori Mulrooney: [name] Other Clinician: Referring Rory Montel: Treating Aziya Arena/Extender:Stone III, Sharen Hint, MARY Weeks in Treatment: 25 Edema Assessment Assessed: [Left: No] [Right: No] Edema: [Left: Yes] [Right: Yes] Calf Left: Right: Point of Measurement: 33 cm From Medial Instep 38 cm 38 cm Ankle Left: Right: Point of Measurement: 12 cm From Medial Instep 28 cm 26 cm Electronic Signature(s) Signed: 12/22/2018 12:05:18 PM By: Carlene Coria RN Entered By: Carlene Coria on 11/17/2018 13:24:17 -------------------------------------------------------------------------------- Henry Details Patient Name: Date of Service: Carlos White 11/17/2018 12:30 PM Medical Record [id number] Patient Account Number: [id number] Date of Birth/Sex: Treating RN: 1940-04-09 (78 y.o. Male) Baruch Gouty Primary Care Mimi Debellis: [name] Other Clinician: Referring Owen Pratte: Treating Ashritha Desrosiers/Extender:Stone III, Sharen Hint, MARY Weeks in Treatment: 67 Active Inactive Venous Leg Ulcer Nursing Diagnoses: Knowledge deficit related to disease process and management Potential for venous Insuffiency (use before  diagnosis confirmed) Goals: Patient will maintain optimal edema control Date Initiated: 09/30/2017 Target Resolution Date: 12/15/2018 Goal Status: Active Patient/caregiver will verbalize understanding of disease process and disease management Date Inactivated: 11/04/2017 Target Resolution Date Initiated: 09/30/2017 Date: 10/30/2017 Goal Status: Met Interventions: Assess peripheral edema status every visit. Provide education on venous insufficiency Notes: Wound/Skin Impairment Nursing Diagnoses: Impaired tissue integrity Knowledge deficit related to  ulceration/compromised skin integrity Goals: Patient/caregiver will verbalize understanding of skin care regimen Date Initiated: 09/30/2017 Target Resolution Date: 12/15/2018 Goal Status: Active Ulcer/skin breakdown will have a volume reduction of 30% by week 4 Date Inactivated: 11/04/2017 Target Resolution Date Initiated: 09/30/2017 Date: 10/30/2017 Goal Status: Met Interventions: Assess patient/caregiver ability to obtain necessary supplies Assess patient/caregiver ability to perform ulcer/skin care regimen upon admission and as needed Assess ulceration(s) every visit Provide education on ulcer and skin care Notes: Electronic Signature(s) Signed: 11/17/2018 6:42:58 PM By: Baruch Gouty RN, BSN Entered By: Baruch Gouty on 11/17/2018 13:30:34 -------------------------------------------------------------------------------- Pain Assessment Details Patient Name: Date of Service: Carlos White 11/17/2018 12:30 PM Medical Record [id number] Patient Account Number: [id number] Date of Birth/Sex: Treating RN: [birth year] (78 y.o. Male) Carlene Coria Primary Care Ceejay Kegley: [name] Other Clinician: Referring Wellington Winegarden: Treating Reeshemah Nazaryan/Extender:Stone III, Sharen Hint, MARY Weeks in Treatment: 38 Active Problems Location of Pain Severity and Description of Pain Patient Has Paino No Site Locations Pain Management and  Medication Current Pain Management: Electronic Signature(s) Signed: 12/22/2018 12:05:18 PM By: Carlene Coria RN Entered By: Carlene Coria on 11/17/2018 13:16:16 -------------------------------------------------------------------------------- Patient/Caregiver Education Details Patient Name: Carlos White 11/4/2020andnbsp12:30 Date of Service: PM Medical Record [id number] Number: Patient Account Number: [id number] Treating RN: Date of Birth/Gender: 1940-02-21 (78 y.o. Baruch Gouty Male) Other Clinician: Primary Care Treating Laurann [place] Physician: Physician/Extender: Referring Physician: Assunta Gambles in Treatment: 93 Education Assessment Education Provided To: Patient Education Topics Provided Venous: Methods: Explain/Verbal Responses: Reinforcements needed, State content correctly Wound/Skin Impairment: Methods: Explain/Verbal Responses: Reinforcements needed, State content correctly Electronic Signature(s) Signed: 11/17/2018 6:42:58 PM By: Baruch Gouty RN, BSN Entered By: Baruch Gouty on 11/17/2018 13:30:54 -------------------------------------------------------------------------------- Wound Assessment Details Patient Name: Date of Service: Carlos White 11/17/2018 12:30 PM Medical Record [id number] Patient Account Number: [id number] Date of Birth/Sex: Treating RN: 1940-01-22 (78 y.o. Male) Carlene Coria Primary Care Lorilee Cafarella: [name] Other Clinician: Referring Zamarion Longest: Treating Elleah Hemsley/Extender:Stone III, Sharen Hint, MARY Weeks in Treatment: 43 Wound Status Wound Number: 2 Primary Venous Leg Ulcer Etiology: Wound Location: Left Lower Leg - Lateral, Distal Wound Open Wounding Event: Gradually Appeared Status: Date Acquired: 03/13/2017 Comorbid Cataracts, Chronic Obstructive Pulmonary Weeks Of Treatment: 59 History: Disease (COPD), Hypertension Clustered Wound: No Photos Wound Measurements Length: (cm) 16 %  Reduct Width: (cm) 14 % Reduct Depth: (cm) 0.1 Epitheli Area: (cm) 175.929 Tunneli Volume: (cm) 17.593 Undermi Wound Description Classification: Full Thickness Without Exposed Support Foul Od Structures Slough/ Wound Flat and Intact Margin: Exudate Medium Amount: Exudate Serosanguineous Type: Exudate red, brown Color: Wound Bed Granulation Amount: Large (67-100%) Granulation Quality: Red, Hyper-granulation Fascia E Necrotic Amount: Small (1-33%) Fat Laye Necrotic Quality: Adherent Slough Tendon E Muscle E Joint Ex Bone Exp or After Cleansing: No Fibrino No Exposed Structure xposed: No r (Subcutaneous Tissue) Exposed: Yes xposed: No xposed: No posed: No osed: No ion in Area: -126.9% ion in Volume: 54.6% alization: Small (1-33%) ng: No ning: No Treatment Notes Wound #2 (Left, Distal, Lateral Lower Leg) 1. Cleanse With Wound Cleanser Soap and water 2. Periwound Care Barrier cream Moisturizing lotion 3. Primary Dressing Applied Collagen 4. Secondary Dressing ABD Pad Roll Gauze Kerramax/Xtrasorb 6. Support Layer Education officer, community) Signed: 11/22/2018 4:06:15 PM By: Mikeal Hawthorne EMT/HBOT Signed: 12/22/2018 12:05:18 PM By: Carlene Coria RN Entered By: Mikeal Hawthorne on 11/19/2018 11:40:05 -------------------------------------------------------------------------------- Wound Assessment Details Patient Name: Date of Service: Carlos White 11/17/2018 12:30 PM Medical Record [id number] Patient Account Number: [id number] Date of Birth/Sex:  Treating RN: [birth year] (78 y.o. Male) [place], [place] Primary Care Terrin Imparato: [name] Other Clinician: Referring Rourke Mcquitty: Treating Meliss Fleek/Extender:Stone III, Sharen Hint, MARY Weeks in Treatment: 2 Wound Status Wound Number: 5 Primary Vasculopathy Etiology: Wound Location: Right Lower Leg - Lateral, Proximal Wound Open Status: Wounding Event: Gradually Appeared Comorbid Cataracts,  Chronic Obstructive Pulmonary Date Acquired: 10/26/2017 History: Disease (COPD), Hypertension Weeks Of Treatment: 55 Clustered Wound: No Photos Wound Measurements Length: (cm) 10.2 % Reduct Width: (cm) 12 % Reduct Depth: (cm) 0.1 Epitheli Area: (cm) 96.133 Tunneli Volume: (cm) 9.613 Undermi Wound Description Classification: Full Thickness Without Exposed Support Foul Od Structures Slough/ Wound Flat and Intact Margin: Exudate Medium Amount: Exudate Serosanguineous Type: Exudate red, brown Color: Wound Bed Granulation Amount: Large (67-100%) Granulation Quality: Red, Hyper-granulation Fascia E Necrotic Amount: None Present (0%) Fat Layer ( Tendon Expo Muscle Expo Joint Expos Bone Expose or After Cleansing: No Fibrino No Exposed Structure xposed: No Subcutaneous Tissue) Exposed: Yes sed: No sed: No ed: No d: No ion in Area: -77.1% ion in Volume: -77.1% alization: Small (1-33%) ng: No ning: No Treatment Notes Wound #5 (Right, Proximal, Lateral Lower Leg) 1. Cleanse With Wound Cleanser Soap and water 2. Periwound Care Barrier cream Moisturizing lotion 3. Primary Dressing Applied Collagen 4. Secondary Dressing ABD Pad Roll Gauze Kerramax/Xtrasorb 6. Support Layer Education officer, community) Signed: 11/22/2018 4:06:15 PM By: Mikeal Hawthorne EMT/HBOT Signed: 12/22/2018 12:05:18 PM By: Carlene Coria RN Entered By: Mikeal Hawthorne on 11/19/2018 11:39:44 -------------------------------------------------------------------------------- Vitals Details Patient Name: Date of Service: Carlos White 11/17/2018 12:30 PM Medical Record [id number] Patient Account Number: [id number] Date of Birth/Sex: Treating RN: [birth year] (78 y.o. Male) [place], [place] Primary Care Jeremy Ditullio: [name] Other Clinician: Referring Robena Ewy: Treating Bryonna Sundby/Extender:Stone III, Sharen Hint, MARY Weeks in Treatment: 43 Vital Signs Time Taken:  13:15 Temperature (F): 98.1 Height (in): 71 Pulse (bpm): 63 Weight (lbs): 220 Respiratory Rate (breaths/min): 18 Body Mass Index (BMI): 30.7 Blood Pressure (mmHg): 147/72 Reference Range: 80 - 120 mg / dl Electronic Signature(s) Signed: 12/22/2018 12:05:18 PM By: Carlene Coria RN Entered By: Carlene Coria on 11/17/2018 13:16:09

## 2018-12-29 ENCOUNTER — Other Ambulatory Visit: Payer: Self-pay

## 2018-12-29 ENCOUNTER — Encounter (HOSPITAL_BASED_OUTPATIENT_CLINIC_OR_DEPARTMENT_OTHER): Payer: Medicare Other | Attending: Physician Assistant | Admitting: Physician Assistant

## 2018-12-29 DIAGNOSIS — I872 Venous insufficiency (chronic) (peripheral): Secondary | ICD-10-CM | POA: Insufficient documentation

## 2018-12-29 DIAGNOSIS — Z87891 Personal history of nicotine dependence: Secondary | ICD-10-CM | POA: Diagnosis not present

## 2018-12-29 DIAGNOSIS — L97812 Non-pressure chronic ulcer of other part of right lower leg with fat layer exposed: Secondary | ICD-10-CM | POA: Insufficient documentation

## 2018-12-29 DIAGNOSIS — L97822 Non-pressure chronic ulcer of other part of left lower leg with fat layer exposed: Secondary | ICD-10-CM | POA: Diagnosis not present

## 2018-12-29 DIAGNOSIS — I10 Essential (primary) hypertension: Secondary | ICD-10-CM | POA: Insufficient documentation

## 2018-12-29 DIAGNOSIS — Q818 Other epidermolysis bullosa: Secondary | ICD-10-CM | POA: Insufficient documentation

## 2018-12-29 DIAGNOSIS — J449 Chronic obstructive pulmonary disease, unspecified: Secondary | ICD-10-CM | POA: Diagnosis not present

## 2018-12-29 NOTE — Progress Notes (Addendum)
RAMAR, GRASS ([id number]) Visit Report for 12/29/2018 Chief Complaint Document Details Patient Name: Date of Service: Carlos White, Carlos White 12/29/2018 12:30 PM Medical Record [id number] Patient Account Number: [id number] Date of Birth/Sex: Treating RN: Nov 11, 1940 (78 y.o. Janyth Contes Primary Care Provider: [name] Other Clinician: Referring Provider: Treating Provider/Extender:Stone III, Sharen Hint, MARY Weeks in Treatment: 22 Information Obtained from: Patient Chief Complaint Bilateral LE Ulcers Electronic Signature(s) Signed: 12/29/2018 1:11:21 PM By: Worthy Keeler PA-C Entered By: Worthy Keeler on 12/29/2018 13:11:21 -------------------------------------------------------------------------------- HPI Details Patient Name: Date of Service: Carlos White 12/29/2018 12:30 PM Medical Record [id number] Patient Account Number: [id number] Date of Birth/Sex: Treating RN: [birth year] (78 y.o. Janyth Contes Primary Care Provider: [name] Other Clinician: Referring Provider: Treating Provider/Extender:Stone III, Sharen Hint, MARY Weeks in Treatment: 12 History of Present Illness HPI Description: 09/30/17 on evaluation today patient presents for initial evaluation and our clinic concerning issues that he has been having with his bilateral lower extremities. He states this has been going on for quite some time at least six months. Currently his regiment has been mainly cleaning the area with peroxide, applying the is foreign ointment, and wrapping the area with ABD pads and then an ace wrap loosely. He has dealt with issues of this nature he tells me for quite some time. He does have a history of having had a compound fracture of the left lower extremity which he thinks also makes this a much more difficult area for him to heal. He's previously been told that he had poor vascular flow but this was years ago at [facility] we do not have any of those records  at this time. He has a history of Epidermolysis Bullosa which was diagnosed around age 62 and he has been cared for at [facility] since that time. Subsequently he states this is hereditary and two of his children one male and one male also have this as well is one of his grandchildren that he is aware of. He has no evidence of infection necessarily at this point although he does have some necrotic tissue noted on the surface of the wound as far as the largest, left lateral lower extremity ulcer, is concerned. Overall I feel like all things considered he's been taking care of this very well. Obviously he has some fairly significant issues going on at this point in this regard. He does have a history otherwise of hypertension though for the most part other than the compound fracture of the left leg he seems to have been fairly healthy in my pinion. 10/07/17 on evaluation today patient actually appears to be doing better in regard to his bilateral lower extremity ulcers. With that being said he does still have some evidence of slough noted on the surface of the wounds I think the Iodoflex has been beneficial for him. His arterial studies are scheduled for October 2. With that being said I do believe that he is continuing to show signs of good improvement which is at least good news. 10/14/17 on evaluation today patient appears to be doing very well in regard to his lower extremity ulcers. He's definitely made some progress as far as healing is concerned although there still are several open areas that are going to need to be addressed. He did have his arterial study today which fortunately shows good findings with a right ABI of 1.23 with a TBI of 0.86 in the left ABI of 1.28 with a TBI of 0.81. This is good  news and will allow [place] to perform debridement as well. 10/23/2017; patient with a large wound on the left lateral calf, sizable area on the left medial malleolus and an area on the right lateral  malleolus. He has a new blister consistent with his underlying blistering skin disease just above this area we have been using Iodoflex on the lateral left calf lateral right ankle and collagen on the medial left ankle. We have been using Kerlix Coban wraps 10/28/17 on evaluation today the patient continues to have signs of improvement in regard to the overall appearance of the original wound. Unfortunately he did have some blistering over the right lateral lower extremity which has appeared to rupture on evaluation today and likely some of the dead tissue on the surface needs to be cleaned away the good news is this does not appear to be to significantly deep at this time. 11/04/17 on evaluation today patient actually appears to be doing a little better in regard to his lower extremity ulcers. He has been tolerating the dressing changes without complication. With that being said he does still have a significant wound especially over the left lateral lower extremity unfortunately. All of the wounds pretty much are going to require sharp debridement today. 11/11/17 on evaluation today patient appears to be doing more poorly in regard to his left lower extremity in particular. There does not appear to be any evidence of systemic infection although the wound itself as far as the larger left lateral lower extremity ulcer actually appears to be infected in my pinion. There's an older and the surface of the wound is dramatically worsened compared to last week. No fevers, chills, nausea, or vomiting noted at this time. 11/18/17 upon evaluation today patient actually appears to be doing better. I did review his culture today which really did not show any specific organism is a positive reason for his wound decline. There are multiple organisms present not predominant. Nonetheless he seems to be tolerate the doxycycline well in his wounds in general do seem to be doing better. Fortunately there does not  appear to be any evidence of infection at this time which is good news. Overall I'm very pleased with how things appear. Nonetheless he still has a lot of healing to go. I do think he could benefit from a Juxta-Lite wrap. 11/25/17 on evaluation today patient actually appears to be doing fairly well in regard to his wounds. He is still taking the antibiotics he has a few days left. Fortunately this seems to have been excellent for him as far as getting the infection control and very happy in this regard. With that being said the patient likewise is also very pleased with how things appear at this time in comparison to where we were he's not having as much pain. 12/02/17 Seen today for follow p and management of LLE wounds. Wounds appear to show some improvement. He denies pain, fever, or chills. Completed a course of doxycycline earlier this month. Scheduled to received Juxta-Lite wrap this week. No s/s of infections. 12/09/17 on evaluation today patient actually appears to be doing a little bit better in regard to his wounds. This is obscene very slow process and unfortunately he has a couple of new areas and this is due to the Epidermolysis Bullosa. Nonetheless I am concerned about the fact that he seems to be getting more areas not less that is the reason we're gonna work on getting schedule for the vascular referral to see the venous  specialist. 12/23/17 upon evaluation today patient's wounds currently shows evidence of still not doing quite as well is what I would like to have seen. Subsequently the patient did have his venous study which showed evidence of venous stasis. Subsequently I do think that a vascular evaluation for consideration of venous intervention would be appropriate. I'm not necessarily suggesting that will be anything that can be done but I think it is at least a good idea. He is in agreement with this plan. 12/30/17 on evaluation today patient actually appears to be doing  very well in regard to his wounds when compared to previous evaluation. Subsequently we have been using the [facility] Dressing which actually appears to have done excellent on his left lateral lower extremity ulcer. The quality of the wound surface is dramatically improved. There is some slight debridement that is going to be required at a couple of locations but overall I'm extremely pleased with how things appear here. 01/07/2018; this is a patient with a primary skin disorder epidermolyis bullosa. Is a large wound on the left lateral calf and smaller wounds on the right however there is a new wound on the right mid tibia area that occurred within the compression wrap that he did not change. We have been using Hydrofera Blue. On the left he is using Hydrofera Blue and Santyl to the inferior part of the wound and changing the dressing himself. 01/15/2018; primary skin disorder epidermolysis bullosa. He has several difficult wounds including the left lateral calf, smaller wounds on the left medial calf and the right lateral calf. The major area on the left lateral calf has a smaller area inferiorly that has necrotic debris we have been using Santyl to this. The rest of the wounds we have been using Hydrofera Blue. The area on the left calf actually looks larger this week. Uncontrolled edema several small open areas above it that are superficial 01/20/18 on evaluation today patient appears to be doing better as compared to last week in regard to his wounds of the bilateral lower extremities. He tolerated the bilateral compression wrap without complication. Overall I'm very pleased with how things appear at this time. The patient likewise is very happy. 01/27/18 on evaluation today patient appears to be doing decently well in regard to his bilateral lower extremity ulcers. He's been tolerating the dressing changes without complication. One issue he had is that he did have more drainage to the left  leg wrapped last week. He states in fact he probably should come in and let us change it on Friday however he just left it in place and kept adding extra absorption with ABD pads to the external portion of the wrap. Unfortunately he does have some aspiration type breakdown nothing significant but I do believe that this was probably counterproductive in general. Nonetheless his wounds do not appear to be terrible overall. 02/03/18 on evaluation today patient appears to be doing rather well in regard to his lower extremity ulcers. He has been tolerating the dressing changes without complication. He does tell me that he had to change the wrap on the left one since we last saw him. Subsequently I do not see any evidence of infection I do feel like the food was much better controlled at this point. 02/10/18 on evaluation today patient appears to be doing rather well in regard to his ulcers. He still has significant alterations especially on the left lateral lower extremity. Fortunately there's no signs of infection at this time. Overall  I feel like he is making good progress are some areas that I'm gonna attempt some debridement today. 02/17/18 on evaluation today patient appears to be doing okay in regard to his lower Trinity ulcer. It does appear on both locations he has a little bit of drainage causing some breakdown in maceration around the wound bed's although it doesn't appear to be too bad the right is a little bit worse than left. Fortunately there's no signs of infection which is good news. No fevers, chills, nausea, or vomiting noted at this time. 03/10/18 on evaluation today patient actually appears to be doing rather poorly in regard to his bilateral lower extremity ulcers. The right in particular is draining profusely and the wound is actually enlarging which is not good. I'm concerned about both possibly infection and the fact that there's a lot of moisture which is causing breakdown as well.  Unfortunately the patient has been trying to change this at home I'm afraid he may need to change more frequently in order to see the improvement that were looking for. There's no signs of systemic infection. 03/17/18 patient actually appears to be doing significantly better at this point in regard to his bilateral lower extremity ulcers. Fortunately there's no signs of infection. That is worsening infection at least indefinitely nothing systemic. With that being said he is having a lot of drainage though not quite as much is there in his last evaluation. Overall feel like he's on the side of improvement. I think if his results back from his culture which showed that he had a positive group B strep along with abundant Pseudomonas noted on the culture. For that reason I am gonna have him continue with the linezolid as we previously have ordered for him and I did go ahead as well today and prescribe Levaquin as well in order to treat the Pseudomonas portion of the infection noted. 03/24/18 on evaluation today patient actually appears to be doing very well in regard to his lower Trinity ulcer. He's been tolerating the dressing changes without complication. Fortunately both legs show signs of less drainage in his edema is very well controlled at this point as well. Overall very pleased with how things seem to be progressing. 03/31/18 on evaluation today patient actually appears to be doing excellent in regard to his bilateral lower extremity ulcers. These are not draining nearly as significant as what they were in the past overall seem to be shown signs of excellent improvement which is great news. Fortunately there is no sign of active infection at this time I do believe that the Levaquin has done extremely well for him in this regard. The patient continues to change these at home typically every day. We may be able to slowly work towards every other day changes since the drainage seems to be slowing down  quite significantly. 04/14/18 on evaluation today patient appears to be doing well in regard to his bilateral lower extremities. Let me Hilda Blades Almost completely healed which is excellent news. Fortunately he's shown signs of improvement all other sites as well with new skin growth there's some slight hyper granular tissue but for the most part this seems to be well maintained with the [facility] Dressing. I'm very happy in this regard. 04/28/18 on evaluation today patient appears to be doing rather well in regard to his ulcers of the bilateral lower extremities all things considering. He continues to make some progress as far as new skin growth. There still some hyper granulation noted  at this point despite the use of the [facility] Dressing. This is not terrible but I think we may want to consider conclude cauterization today with silver nitrate to try to help knock some of his back as well as helping with any biofilm on the surface of the wound. 05/12/18 on evaluation today patient's wounds actually appear to be doing fairly well in regard to the bilateral lower extremities. He's been tolerating the dressing changes without complication. Fortunately there's no signs of active infection at this time which is good news. Overall very pleased with how things seem to be progressing. You select silver nitrate was beneficial for him. 05/26/18 on evaluation today patient appears to be doing better in regard to left lower extremity and a little bit worse in regard to the right lower extremity. He states that he was pulling off the [facility] Dressing peel back some of the skin making this area significantly larger than what it was previous. He's not had any issues other than this and states even that hasn't caused any pain he just seems to obviously have a much larger area on the right when compared to the previous time I saw him. No fevers, chills, nausea, or vomiting noted at this time. 06/16/18  on evaluation today patient actually appears to be doing a little better in my pinion in regard to his lower summary ulcers. He has new skin islands that seem to be spreading which is good news. Fortunately there's no signs of active infection at this time. His biggest issue is he tells me that coming as often as he does is becoming very cost prohibitive. He wonders if we can potentially spread this out. 07/14/18 on evaluation today patient appears to be doing a little bit worse in regard to his lower from the ulcer. Unfortunately he still continues to have a significant amount of drainage I think we need to do something to try to help this more. He is still somewhat reluctant to go the route of the Wound VAC although that may be the most appropriate thing for him. No fevers, chills, nausea, or vomiting noted at this time. 08/18/2018 on evaluation today patient actually appears to be doing quite well with regard to his bilateral lower extremity ulcers. I do feel like that currently he is making great progress the care max does seem to be doing a great job at helping to control the moisture he has no maceration or skin breakdown. Again this seems to be an excellent way to go. 1 thing we may want to change is adding collagen to the base of the wound and then the care max over top he is not opposed to this. 09/15/2018 on evaluation today patient appears to be doing well with regard to his bilateral lower extremity ulcers. He is showing some signs of improvement not necessarily in size but definitely in appearance. In fact he has a lot of new skin growing throughout the wounds along the edges as well as in the central portion of the wounds on both lower extremities. Overall I am extremely pleased to see this. 10/20/2018 on evaluation today patient actually appears to be doing quite well with regard to his wounds. They are not measuring significantly smaller but he does have a lot of new epithelization noted  as compared to previous. Fortunately there is no signs of active infection at this time. No fevers, chills, nausea, vomiting, or diarrhea. 11/17/2018 on evaluation today patient presents for reevaluation concerning his bilateral  lower extremity ulcers. Fortunately there is no signs of active infection at this time today. He has been tolerating the dressing changes without complication. No fevers, chills, nausea, vomiting, or diarrhea. Unfortunately in general the patient has not made as much improvement as I would like to have seen up to this point. He has been tolerating the dressing changes without complication and he does an excellent job taking care of his wounds at home in my opinion. The biggest issue I see is that he is just not making the progress that we need to be seeing currently. I think we may want to consider having him seen at a plastic surgery appointment and he has previously seen someone in years past at [facility] in [place]. That is definitely a possibility for [place] to look into at this point. 12/29/2018 on evaluation today patient appears to be doing better in regard to the overall visual appearance of his wounds which do not appear to be as macerated. He does have a much larger skin island in the middle of the left lower extremity ulcer which is doing much better. He tells me the pain is also significantly better. With that being said overall his improvement as far as the size of the wounds is not better but again these are very irregular in change shape quite often. Fortunately there is no evidence of active infection at this time which is great news. He never heard anything from [facility] regarding the plastic surgery referral that we made to them. Electronic Signature(s) Signed: 12/29/2018 1:29:39 PM By: Worthy Keeler PA-C Entered By: Worthy Keeler on 12/29/2018  13:29:39 -------------------------------------------------------------------------------- Physical Exam Details Patient Name: Date of Service: Carlos White, Carlos White 12/29/2018 12:30 PM Medical Record [id number] Patient Account Number: [id number] Date of Birth/Sex: Treating RN: 05-Jun-1940 (78 y.o. Janyth Contes Primary Care Provider: [name] Other Clinician: Referring Provider: Treating Provider/Extender:Stone III, Sharen Hint, MARY Weeks in Treatment: 24 Constitutional Well-nourished and well-hydrated in no acute distress. Respiratory normal breathing without difficulty. clear to auscultation bilaterally. Cardiovascular regular rate and rhythm with normal S1, S2. Psychiatric this patient is able to make decisions and demonstrates good insight into disease process. Alert and Oriented x 3. pleasant and cooperative. Notes Patient's wound bed currently at both locations appears to be fairly free of slough and biofilm buildup on the surface of the wounds and is showing some signs of good epithelization in some areas especially in the middle of the left leg ulcer he has a fairly large area of new tissue on a skin island which is excellent news. Overall again he is not having the pain he once had and he feels like things are doing quite well All things considered. Electronic Signature(s) Signed: 12/29/2018 1:30:17 PM By: Worthy Keeler PA-C Entered By: Worthy Keeler on 12/29/2018 13:30:17 -------------------------------------------------------------------------------- Physician Orders Details Patient Name: Date of Service: KARTER, PREISER 12/29/2018 12:30 PM Medical Record [id number] Patient Account Number: [id number] Date of Birth/Sex: Treating RN: 04-08-1940 (78 y.o. Janyth Contes Primary Care Provider: [name] Other Clinician: Referring Provider: Treating Provider/Extender:Stone III, Sharen Hint, MARY Weeks in Treatment: 85 Verbal / Phone Orders:  No Diagnosis Coding ICD-10 Coding Code Description I87.2 Venous insufficiency (chronic) (peripheral) Q81.8 Other epidermolysis bullosa L97.322 Non-pressure chronic ulcer of left ankle with fat layer exposed L97.822 Non-pressure chronic ulcer of other part of left lower leg with fat layer exposed L97.812 Non-pressure chronic ulcer of other part of right lower leg with fat layer exposed  I10 Essential (primary) hypertension Follow-up Appointments Return appointment in 1 month. Dressing Change Frequency Wound #2 Left,Distal,Lateral Lower Leg Change Dressing every other day. Wound #5 Right,Proximal,Lateral Lower Leg Change Dressing every other day. Skin Barriers/Peri-Wound Care Barrier cream - to periwound areas Moisturizing lotion - both legs with dressing changes Wound Cleansing Wound #2 Left,Distal,Lateral Lower Leg May shower and wash wound with soap and water. - with dressing changes Wound #5 Right,Proximal,Lateral Lower Leg May shower and wash wound with soap and water. - with dressing changes Primary Wound Dressing Wound #2 Left,Distal,Lateral Lower Leg Collagen - fibracol - do not need to moisten Wound #5 Right,Proximal,Lateral Lower Leg Collagen - fibracol - do not need to moisten Secondary Dressing Wound #2 Left,Distal,Lateral Lower Leg Kerlix/Rolled Gauze ABD pad - all wounds Kerramax - or xtrasorb Wound #5 Right,Proximal,Lateral Lower Leg Kerlix/Rolled Gauze ABD pad - all wounds Kerramax Edema Control Avoid standing for long periods of time Elevate legs to the level of the heart or above for 30 minutes daily and/or when sitting, a frequency of: - throughout the day Support Garment 20-30 mm/Hg pressure to: - patient to apply juxtalites daily to both legs over ace wraps Other: - ACE wrap both legs Electronic Signature(s) Signed: 12/29/2018 5:37:18 PM By: Worthy Keeler PA-C Signed: 12/30/2018 5:32:06 PM By: Levan Hurst RN, BSN Entered By: Levan Hurst on  12/29/2018 13:28:39 -------------------------------------------------------------------------------- Problem List Details Patient Name: Date of Service: Carlos White 12/29/2018 12:30 PM Medical Record [id number] Patient Account Number: [id number] Date of Birth/Sex: Treating RN: 07/26/1940 (78 y.o. Janyth Contes Primary Care Provider: [name] Other Clinician: Referring Provider: Treating Provider/Extender:Stone III, Sharen Hint, MARY Weeks in Treatment: 24 Active Problems ICD-10 Evaluated Encounter Code Description Active Date Today Diagnosis I87.2 Venous insufficiency (chronic) (peripheral) 09/30/2017 No Yes Q81.8 Other epidermolysis bullosa 09/30/2017 No Yes L97.322 Non-pressure chronic ulcer of left ankle with fat layer 09/30/2017 No Yes exposed L97.822 Non-pressure chronic ulcer of other part of left lower 09/30/2017 No Yes leg with fat layer exposed L97.812 Non-pressure chronic ulcer of other part of right lower 09/30/2017 No Yes leg with fat layer exposed [place] (primary) hypertension 09/30/2017 No Yes Inactive Problems Resolved Problems Electronic Signature(s) Signed: 12/29/2018 1:11:12 PM By: Worthy Keeler PA-C Entered By: Worthy Keeler on 12/29/2018 13:11:12 -------------------------------------------------------------------------------- Progress Note Details Patient Name: Date of Service: Carlos White 12/29/2018 12:30 PM Medical Record [id number] Patient Account Number: [id number] Date of Birth/Sex: Treating RN: Nov 22, 1940 (78 y.o. Janyth Contes Primary Care Provider: [name] Other Clinician: Referring Provider: Treating Provider/Extender:Stone III, Sharen Hint, MARY Weeks in Treatment: 29 Subjective Chief Complaint Information obtained from Patient Bilateral LE Ulcers History of Present Illness (HPI) 09/30/17 on evaluation today patient presents for initial evaluation and our clinic concerning issues that he has  been having with his bilateral lower extremities. He states this has been going on for quite some time at least six months. Currently his regiment has been mainly cleaning the area with peroxide, applying the is foreign ointment, and wrapping the area with ABD pads and then an ace wrap loosely. He has dealt with issues of this nature he tells me for quite some time. He does have a history of having had a compound fracture of the left lower extremity which he thinks also makes this a much more difficult area for him to heal. He's previously been told that he had poor vascular flow but this was years ago at [facility] we do not have any of those  records at this time. He has a history of Epidermolysis Bullosa which was diagnosed around age 56 and he has been cared for at [facility] since that time. Subsequently he states this is hereditary and two of his children one male and one male also have this as well is one of his grandchildren that he is aware of. He has no evidence of infection necessarily at this point although he does have some necrotic tissue noted on the surface of the wound as far as the largest, left lateral lower extremity ulcer, is concerned. Overall I feel like all things considered he's been taking care of this very well. Obviously he has some fairly significant issues going on at this point in this regard. He does have a history otherwise of hypertension though for the most part other than the compound fracture of the left leg he seems to have been fairly healthy in my pinion. 10/07/17 on evaluation today patient actually appears to be doing better in regard to his bilateral lower extremity ulcers. With that being said he does still have some evidence of slough noted on the surface of the wounds I think the Iodoflex has been beneficial for him. His arterial studies are scheduled for October 2. With that being said I do believe that he is continuing to show signs of good improvement which is  at least good news. 10/14/17 on evaluation today patient appears to be doing very well in regard to his lower extremity ulcers. He's definitely made some progress as far as healing is concerned although there still are several open areas that are going to need to be addressed. He did have his arterial study today which fortunately shows good findings with a right ABI of 1.23 with a TBI of 0.86 in the left ABI of 1.28 with a TBI of 0.81. This is good news and will allow [place] to perform debridement as well. 10/23/2017; patient with a large wound on the left lateral calf, sizable area on the left medial malleolus and an area on the right lateral malleolus. He has a new blister consistent with his underlying blistering skin disease just above this area we have been using Iodoflex on the lateral left calf lateral right ankle and collagen on the medial left ankle. We have been using Kerlix Coban wraps 10/28/17 on evaluation today the patient continues to have signs of improvement in regard to the overall appearance of the original wound. Unfortunately he did have some blistering over the right lateral lower extremity which has appeared to rupture on evaluation today and likely some of the dead tissue on the surface needs to be cleaned away the good news is this does not appear to be to significantly deep at this time. 11/04/17 on evaluation today patient actually appears to be doing a little better in regard to his lower extremity ulcers. He has been tolerating the dressing changes without complication. With that being said he does still have a significant wound especially over the left lateral lower extremity unfortunately. All of the wounds pretty much are going to require sharp debridement today. 11/11/17 on evaluation today patient appears to be doing more poorly in regard to his left lower extremity in particular. There does not appear to be any evidence of systemic infection although the wound itself  as far as the larger left lateral lower extremity ulcer actually appears to be infected in my pinion. There's an older and the surface of the wound is dramatically worsened compared to last week.  No fevers, chills, nausea, or vomiting noted at this time. 11/18/17 upon evaluation today patient actually appears to be doing better. I did review his culture today which really did not show any specific organism is a positive reason for his wound decline. There are multiple organisms present not predominant. Nonetheless he seems to be tolerate the doxycycline well in his wounds in general do seem to be doing better. Fortunately there does not appear to be any evidence of infection at this time which is good news. Overall I'm very pleased with how things appear. Nonetheless he still has a lot of healing to go. I do think he could benefit from a Juxta-Lite wrap. 11/25/17 on evaluation today patient actually appears to be doing fairly well in regard to his wounds. He is still taking the antibiotics he has a few days left. Fortunately this seems to have been excellent for him as far as getting the infection control and very happy in this regard. With that being said the patient likewise is also very pleased with how things appear at this time in comparison to where we were he's not having as much pain. 12/02/17 Seen today for follow p and management of LLE wounds. Wounds appear to show some improvement. He denies pain, fever, or chills. Completed a course of doxycycline earlier this month. Scheduled to received Juxta-Lite wrap this week. No s/s of infections. 12/09/17 on evaluation today patient actually appears to be doing a little bit better in regard to his wounds. This is obscene very slow process and unfortunately he has a couple of new areas and this is due to the Epidermolysis Bullosa. Nonetheless I am concerned about the fact that he seems to be getting more areas not less that is the reason we're  gonna work on getting schedule for the vascular referral to see the venous specialist. 12/23/17 upon evaluation today patient's wounds currently shows evidence of still not doing quite as well is what I would like to have seen. Subsequently the patient did have his venous study which showed evidence of venous stasis. Subsequently I do think that a vascular evaluation for consideration of venous intervention would be appropriate. I'm not necessarily suggesting that will be anything that can be done but I think it is at least a good idea. He is in agreement with this plan. 12/30/17 on evaluation today patient actually appears to be doing very well in regard to his wounds when compared to previous evaluation. Subsequently we have been using the [facility] Dressing which actually appears to have done excellent on his left lateral lower extremity ulcer. The quality of the wound surface is dramatically improved. There is some slight debridement that is going to be required at a couple of locations but overall I'm extremely pleased with how things appear here. 01/07/2018; this is a patient with a primary skin disorder epidermolyis bullosa. Is a large wound on the left lateral calf and smaller wounds on the right however there is a new wound on the right mid tibia area that occurred within the compression wrap that he did not change. We have been using Hydrofera Blue. On the left he is using Hydrofera Blue and Santyl to the inferior part of the wound and changing the dressing himself. 01/15/2018; primary skin disorder epidermolysis bullosa. He has several difficult wounds including the left lateral calf, smaller wounds on the left medial calf and the right lateral calf. The major area on the left lateral calf has a smaller area inferiorly  that has necrotic debris we have been using Santyl to this. The rest of the wounds we have been using Hydrofera Blue. The area on the left calf actually looks larger  this week. Uncontrolled edema several small open areas above it that are superficial 01/20/18 on evaluation today patient appears to be doing better as compared to last week in regard to his wounds of the bilateral lower extremities. He tolerated the bilateral compression wrap without complication. Overall I'm very pleased with how things appear at this time. The patient likewise is very happy. 01/27/18 on evaluation today patient appears to be doing decently well in regard to his bilateral lower extremity ulcers. He's been tolerating the dressing changes without complication. One issue he had is that he did have more drainage to the left leg wrapped last week. He states in fact he probably should come in and let us change it on Friday however he just left it in place and kept adding extra absorption with ABD pads to the external portion of the wrap. Unfortunately he does have some aspiration type breakdown nothing significant but I do believe that this was probably counterproductive in general. Nonetheless his wounds do not appear to be terrible overall. 02/03/18 on evaluation today patient appears to be doing rather well in regard to his lower extremity ulcers. He has been tolerating the dressing changes without complication. He does tell me that he had to change the wrap on the left one since we last saw him. Subsequently I do not see any evidence of infection I do feel like the food was much better controlled at this point. 02/10/18 on evaluation today patient appears to be doing rather well in regard to his ulcers. He still has significant alterations especially on the left lateral lower extremity. Fortunately there's no signs of infection at this time. Overall I feel like he is making good progress are some areas that I'm gonna attempt some debridement today. 02/17/18 on evaluation today patient appears to be doing okay in regard to his lower Trinity ulcer. It does appear on both locations he has a  little bit of drainage causing some breakdown in maceration around the wound bed's although it doesn't appear to be too bad the right is a little bit worse than left. Fortunately there's no signs of infection which is good news. No fevers, chills, nausea, or vomiting noted at this time. 03/10/18 on evaluation today patient actually appears to be doing rather poorly in regard to his bilateral lower extremity ulcers. The right in particular is draining profusely and the wound is actually enlarging which is not good. I'm concerned about both possibly infection and the fact that there's a lot of moisture which is causing breakdown as well. Unfortunately the patient has been trying to change this at home I'm afraid he may need to change more frequently in order to see the improvement that were looking for. There's no signs of systemic infection. 03/17/18 patient actually appears to be doing significantly better at this point in regard to his bilateral lower extremity ulcers. Fortunately there's no signs of infection. That is worsening infection at least indefinitely nothing systemic. With that being said he is having a lot of drainage though not quite as much is there in his last evaluation. Overall feel like he's on the side of improvement. I think if his results back from his culture which showed that he had a positive group B strep along with abundant Pseudomonas noted on the culture. For that  reason I am gonna have him continue with the linezolid as we previously have ordered for him and I did go ahead as well today and prescribe Levaquin as well in order to treat the Pseudomonas portion of the infection noted. 03/24/18 on evaluation today patient actually appears to be doing very well in regard to his lower Trinity ulcer. He's been tolerating the dressing changes without complication. Fortunately both legs show signs of less drainage in his edema is very well controlled at this point as well. Overall  very pleased with how things seem to be progressing. 03/31/18 on evaluation today patient actually appears to be doing excellent in regard to his bilateral lower extremity ulcers. These are not draining nearly as significant as what they were in the past overall seem to be shown signs of excellent improvement which is great news. Fortunately there is no sign of active infection at this time I do believe that the Levaquin has done extremely well for him in this regard. The patient continues to change these at home typically every day. We may be able to slowly work towards every other day changes since the drainage seems to be slowing down quite significantly. 04/14/18 on evaluation today patient appears to be doing well in regard to his bilateral lower extremities. Let me [facility] Almost completely healed which is excellent news. Fortunately he's shown signs of improvement all other sites as well with new skin growth there's some slight hyper granular tissue but for the most part this seems to be well maintained with the [facility] Dressing. I'm very happy in this regard. 04/28/18 on evaluation today patient appears to be doing rather well in regard to his ulcers of the bilateral lower extremities all things considering. He continues to make some progress as far as new skin growth. There still some hyper granulation noted at this point despite the use of the [facility] Dressing. This is not terrible but I think we may want to consider conclude cauterization today with silver nitrate to try to help knock some of his back as well as helping with any biofilm on the surface of the wound. 05/12/18 on evaluation today patient's wounds actually appear to be doing fairly well in regard to the bilateral lower extremities. He's been tolerating the dressing changes without complication. Fortunately there's no signs of active infection at this time which is good news. Overall very pleased with how things  seem to be progressing. You select silver nitrate was beneficial for him. 05/26/18 on evaluation today patient appears to be doing better in regard to left lower extremity and a little bit worse in regard to the right lower extremity. He states that he was pulling off the [facility] Dressing peel back some of the skin making this area significantly larger than what it was previous. He's not had any issues other than this and states even that hasn't caused any pain he just seems to obviously have a much larger area on the right when compared to the previous time I saw him. No fevers, chills, nausea, or vomiting noted at this time. 06/16/18 on evaluation today patient actually appears to be doing a little better in my pinion in regard to his lower summary ulcers. He has new skin islands that seem to be spreading which is good news. Fortunately there's no signs of active infection at this time. His biggest issue is he tells me that coming as often as he does is becoming very cost prohibitive. He wonders if  we can potentially spread this out. 07/14/18 on evaluation today patient appears to be doing a little bit worse in regard to his lower from the ulcer. Unfortunately he still continues to have a significant amount of drainage I think we need to do something to try to help this more. He is still somewhat reluctant to go the route of the Wound VAC although that may be the most appropriate thing for him. No fevers, chills, nausea, or vomiting noted at this time. 08/18/2018 on evaluation today patient actually appears to be doing quite well with regard to his bilateral lower extremity ulcers. I do feel like that currently he is making great progress the care max does seem to be doing a great job at helping to control the moisture he has no maceration or skin breakdown. Again this seems to be an excellent way to go. 1 thing we may want to change is adding collagen to the base of the wound and then the  care max over top he is not opposed to this. 09/15/2018 on evaluation today patient appears to be doing well with regard to his bilateral lower extremity ulcers. He is showing some signs of improvement not necessarily in size but definitely in appearance. In fact he has a lot of new skin growing throughout the wounds along the edges as well as in the central portion of the wounds on both lower extremities. Overall I am extremely pleased to see this. 10/20/2018 on evaluation today patient actually appears to be doing quite well with regard to his wounds. They are not measuring significantly smaller but he does have a lot of new epithelization noted as compared to previous. Fortunately there is no signs of active infection at this time. No fevers, chills, nausea, vomiting, or diarrhea. 11/17/2018 on evaluation today patient presents for reevaluation concerning his bilateral lower extremity ulcers. Fortunately there is no signs of active infection at this time today. He has been tolerating the dressing changes without complication. No fevers, chills, nausea, vomiting, or diarrhea. Unfortunately in general the patient has not made as much improvement as I would like to have seen up to this point. He has been tolerating the dressing changes without complication and he does an excellent job taking care of his wounds at home in my opinion. The biggest issue I see is that he is just not making the progress that we need to be seeing currently. I think we may want to consider having him seen at a plastic surgery appointment and he has previously seen someone in years past at [facility] in [place]. That is definitely a possibility for [place] to look into at this point. 12/29/2018 on evaluation today patient appears to be doing better in regard to the overall visual appearance of his wounds which do not appear to be as macerated. He does have a much larger skin island in the middle of the  left lower extremity ulcer which is doing much better. He tells me the pain is also significantly better. With that being said overall his improvement as far as the size of the wounds is not better but again these are very irregular in change shape quite often. Fortunately there is no evidence of active infection at this time which is great news. He never heard anything from [facility] regarding the plastic surgery referral that we made to them. Patient History Information obtained from Patient. Family History Stroke - Maternal Grandparents, No family history of Cancer, Diabetes, Heart Disease, Hereditary  Spherocytosis, Hypertension, Kidney Disease, Lung Disease, Seizures, Thyroid Problems, Tuberculosis. Social History Former smoker - quit 1986, Marital Status - Married, Alcohol Use - Never, Drug Use - No History, Caffeine Use - Daily - coffee. Medical History Eyes Patient has history of Cataracts Denies history of Glaucoma, Optic Neuritis Ear/Nose/Mouth/Throat Denies history of Chronic sinus problems/congestion, Middle ear problems Hematologic/Lymphatic Denies history of Anemia, Hemophilia, Human Immunodeficiency Virus, Lymphedema, Sickle Cell Disease Respiratory Patient has history of Chronic Obstructive Pulmonary Disease (COPD) Denies history of Aspiration, Asthma, Pneumothorax, Sleep Apnea, Tuberculosis Cardiovascular Patient has history of Hypertension Denies history of Angina, Arrhythmia, Congestive Heart Failure, Coronary Artery Disease, Deep Vein Thrombosis, Hypotension, Myocardial Infarction, Peripheral Arterial Disease, Peripheral Venous Disease, Phlebitis, Vasculitis Gastrointestinal Denies history of Cirrhosis , Colitis, Crohnoos, Hepatitis A, Hepatitis B, Hepatitis C Endocrine Denies history of Type I Diabetes, Type II Diabetes Genitourinary Denies history of End Stage Renal Disease Immunological Denies history of Lupus Erythematosus, Raynaudoos,  Scleroderma Integumentary (Skin) Denies history of History of Burn Musculoskeletal Denies history of Gout, Rheumatoid Arthritis, Osteoarthritis, Osteomyelitis Neurologic Denies history of Dementia, Neuropathy, Quadriplegia, Paraplegia, Seizure Disorder Oncologic Denies history of Received Chemotherapy, Received Radiation Psychiatric Denies history of Anorexia/bulimia, Confinement Anxiety Review of Systems (ROS) Constitutional Symptoms (General Health) Denies complaints or symptoms of Fatigue, Fever, Chills, Marked Weight Change. Respiratory Denies complaints or symptoms of Chronic or frequent coughs, Shortness of Breath. Cardiovascular Denies complaints or symptoms of Chest pain. Psychiatric Denies complaints or symptoms of Claustrophobia, Suicidal. Objective Constitutional Well-nourished and well-hydrated in no acute distress. Vitals Time Taken: 12:45 PM, Height: 71 in, Weight: 220 lbs, BMI: 30.7, Temperature: 97.7 F, Pulse: 64 bpm, Respiratory Rate: 18 breaths/min, Blood Pressure: 171/67 mmHg. Respiratory normal breathing without difficulty. clear to auscultation bilaterally. Cardiovascular regular rate and rhythm with normal S1, S2. Psychiatric this patient is able to make decisions and demonstrates good insight into disease process. Alert and Oriented x 3. pleasant and cooperative. General Notes: Patient's wound bed currently at both locations appears to be fairly free of slough and biofilm buildup on the surface of the wounds and is showing some signs of good epithelization in some areas especially in the middle of the left leg ulcer he has a fairly large area of new tissue on a skin island which is excellent news. Overall again he is not having the pain he once had and he feels like things are doing quite well All things considered. Integumentary (Hair, Skin) Wound #2 status is Open. Original cause of wound was Gradually Appeared. The wound is located on  the Left,Distal,Lateral Lower Leg. The wound measures 16.4cm length x 15.5cm width x 0.1cm depth; 199.648cm^2 area and 19.965cm^3 volume. There is Fat Layer (Subcutaneous Tissue) Exposed exposed. There is no tunneling or undermining noted. There is a medium amount of serosanguineous drainage noted. The wound margin is flat and intact. There is large (67-100%) red, hyper - granulation within the wound bed. There is a small (1-33%) amount of necrotic tissue within the wound bed including Adherent Slough. Wound #5 status is Open. Original cause of wound was Gradually Appeared. The wound is located on the Right,Proximal,Lateral Lower Leg. The wound measures 13cm length x 12.5cm width x 0.1cm depth; 127.627cm^2 area and 12.763cm^3 volume. There is Fat Layer (Subcutaneous Tissue) Exposed exposed. There is no tunneling or undermining noted. There is a medium amount of serosanguineous drainage noted. The wound margin is flat and intact. There is large (67-100%) red, hyper - granulation within the wound bed. There is a small (  1-33%) amount of necrotic tissue within the wound bed including Adherent Slough. 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 Dressing Change Frequency: Wound #2 Left,Distal,Lateral Lower Leg: Change Dressing every other day. Wound #5 Right,Proximal,Lateral Lower Leg: Change Dressing every other day. Skin Barriers/Peri-Wound Care: Barrier cream - to periwound areas Moisturizing lotion - both legs with dressing changes Wound Cleansing: Wound #2 Left,Distal,Lateral Lower Leg: May shower and wash  wound with soap and water. - with dressing changes Wound #5 Right,Proximal,Lateral Lower Leg: May shower and wash wound with soap and water. - with dressing changes Primary Wound Dressing: Wound #2 Left,Distal,Lateral Lower Leg: Collagen - fibracol - do not need to moisten Wound #5 Right,Proximal,Lateral Lower Leg: Collagen - fibracol - do not need to moisten Secondary Dressing: Wound #2 Left,Distal,Lateral Lower Leg: Kerlix/Rolled Gauze ABD pad - all wounds Kerramax - or xtrasorb Wound #5 Right,Proximal,Lateral Lower Leg: Kerlix/Rolled Gauze ABD pad - all wounds Kerramax Edema Control: Avoid standing for long periods of time Elevate legs to the level of the heart or above for 30 minutes daily and/or when sitting, a frequency of: - throughout the day Support Garment 20-30 mm/Hg pressure to: - patient to apply juxtalites daily to both legs over ace wraps Other: - ACE wrap both legs 1. My suggestion at this point is good to be that we go ahead and initiate treatment with a continuation of the current wound care measures which includes the silver collagen to the wound beds followed by the Xtrasorb over top which does seem to be at least controlling the edema and helping out with keeping things from breaking down significantly. 2. I am also can recommend that we continue with the rolled gauze to secure everything in place and then the patient is using his juxta lite wraps over top of this to help with edema control overall he is done very well with this in my opinion. 3. We will still can see about the referral to [facility] which we have already made in fact for the patient we gave him the number today to contact them to see if they can get him scheduled for an appointment as he has not heard from them since last month. We will see patient back for reevaluation in 1 Month here in the clinic. If anything worsens or changes patient will contact our office for additional  recommendations. Electronic Signature(s) Signed: 12/29/2018 1:31:36 PM By: Worthy Keeler PA-C Previous Signature: 12/29/2018 1:31:19 PM Version By: Worthy Keeler PA-C Entered By: Worthy Keeler on 12/29/2018 13:31:36 -------------------------------------------------------------------------------- HxROS Details Patient Name: Date of Service: Carlos White 12/29/2018 12:30 PM Medical Record [id number] Patient Account Number: [id number] Date of Birth/Sex: Treating RN: Jun 24, 1940 (78 y.o. Janyth Contes Primary Care Provider: [name] Other Clinician: Referring Provider: Treating Provider/Extender:Stone III, Sharen Hint, MARY Weeks in Treatment: 10 Information Obtained From Patient Constitutional Symptoms (General Health) Complaints and Symptoms: Negative for: Fatigue; Fever; Chills; Marked Weight Change Respiratory Complaints and Symptoms: Negative for: Chronic or frequent coughs; Shortness of Breath Medical History: Positive for: Chronic Obstructive Pulmonary Disease (COPD) Negative for: Aspiration; Asthma; Pneumothorax; Sleep Apnea; Tuberculosis Cardiovascular Complaints and Symptoms: Negative for: Chest pain Medical History: Positive for:  Hypertension Negative for: Angina; Arrhythmia; Congestive Heart Failure; Coronary Artery Disease; Deep Vein Thrombosis; Hypotension; Myocardial Infarction; Peripheral Arterial Disease; Peripheral Venous Disease; Phlebitis; Vasculitis Psychiatric Complaints and Symptoms: Negative for: Claustrophobia; Suicidal Medical History: Negative for: Anorexia/bulimia; Confinement Anxiety Eyes Medical History: Positive for: Cataracts Negative for: Glaucoma; Optic Neuritis Ear/Nose/Mouth/Throat Medical History: Negative for: Chronic sinus problems/congestion; Middle ear problems Hematologic/Lymphatic Medical History: Negative for: Anemia; Hemophilia; Human Immunodeficiency Virus; Lymphedema; Sickle Cell  Disease Gastrointestinal Medical History: Negative for: Cirrhosis ; Colitis; Crohns; Hepatitis A; Hepatitis B; Hepatitis C Endocrine Medical History: Negative for: Type I Diabetes; Type II Diabetes Genitourinary Medical History: Negative for: End Stage Renal Disease Immunological Medical History: Negative for: Lupus Erythematosus; Raynauds; Scleroderma Integumentary (Skin) Medical History: Negative for: History of Burn Musculoskeletal Medical History: Negative for: Gout; Rheumatoid Arthritis; Osteoarthritis; Osteomyelitis Neurologic Medical History: Negative for: Dementia; Neuropathy; Quadriplegia; Paraplegia; Seizure Disorder Oncologic Medical History: Negative for: Received Chemotherapy; Received Radiation HBO Extended History Items Eyes: Cataracts Immunizations Pneumococcal Vaccine: Received Pneumococcal Vaccination: No Implantable Devices No devices added Family and Social History Cancer: No; Diabetes: No; Heart Disease: No; Hereditary Spherocytosis: No; Hypertension: No; Kidney Disease: No; Lung Disease: No; Seizures: No; Stroke: Yes - Maternal Grandparents; Thyroid Problems: No; Tuberculosis: No; Former smoker - quit 1986; Marital Status - Married; Alcohol Use: Never; Drug Use: No History; Caffeine Use: Daily - coffee; Financial Concerns: No; Food, Clothing or Shelter Needs: No; Support System Lacking: No; Transportation Concerns: No Physician Affirmation I have reviewed and agree with the above information. Electronic Signature(s) Signed: 12/29/2018 5:37:18 PM By: Worthy Keeler PA-C Signed: 12/30/2018 5:32:06 PM By: Levan Hurst RN, BSN Entered By: Worthy Keeler on 12/29/2018 13:29:54 -------------------------------------------------------------------------------- SuperBill Details Patient Name: Date of Service: Carlos White 12/29/2018 Medical Record [id number] Patient Account Number: [id number] Date of Birth/Sex: Treating RN: Dec 09, 1940 (78  y.o. 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.812 Non-pressure chronic ulcer of other part of right lower leg with fat layer exposed [place] (primary) hypertension Facility Procedures CPT4 Code: [id number] Description: 99214 - WOUND CARE VISIT-LEV 4 EST PT Modifier: Quantity: 1 Physician Procedures CPT4 Code Description: [id number] - WC PHYS LEVEL 4 - EST PT ICD-10 Diagnosis Description I87.2 Venous insufficiency (chronic) (peripheral) 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

## 2018-12-30 NOTE — Progress Notes (Signed)
Carlos White ([id number]) Visit Report for 12/29/2018 Arrival Information Details Patient Name: Date of Service: Carlos White, Carlos White 12/29/2018 12:30 PM Medical Record [id number] Patient Account Number: [id number] Date of Birth/Sex: Treating RN: [birth year] (78 y.o. [name] Primary Care Ralphael Southgate: [name] Other Clinician: Referring Malasha Kleppe: Treating Jaria Conway/Extender:Stone III, Sharen Hint, MARY Weeks in Treatment: 100 Visit Information History Since Last Visit Added or deleted any medications: No Patient Arrived: Ambulatory Any new allergies or adverse reactions: No Arrival Time: 12:48 Had a fall or experienced change in No Accompanied By: self activities of daily living that may affect Transfer Assistance: None risk of falls: Patient Identification Verified: Yes Signs or symptoms of abuse/neglect since last No Secondary Verification Process Yes visito Completed: Hospitalized since last visit: No Patient Requires Transmission-Based No Implantable device outside of the clinic excluding No Precautions: cellular tissue based products placed in the center Patient Has Alerts: Yes since last visit: Patient Alerts: R ABI= 1.23, TBI Has Dressing in Place as Prescribed: Yes = .86 Pain Present Now: No L ABI= 1.28, TBI=.81 Electronic Signature(s) Signed: 12/30/2018 5:33:40 PM By: Kela Millin Entered By: Kela Millin on 12/29/2018 12:49:22 -------------------------------------------------------------------------------- Clinic Level of Care Assessment Details Patient Name: Date of Service: Carlos White 12/29/2018 12:30 PM Medical Record [id number] Patient Account Number: [id number] Date of Birth/Sex: Treating RN: Apr 05, 1940 (78 y.o. Janyth Contes Primary Care Gershon Shorten: [name] Other Clinician: Referring Parlee Amescua: Treating Jeniyah Menor/Extender:Stone III, Sharen Hint, MARY Weeks in Treatment: 55 Clinic Level of Care Assessment  Items TOOL 4 Quantity Score X - Use when only an EandM is performed on FOLLOW-UP visit 1 0 ASSESSMENTS - Nursing Assessment / Reassessment X - Reassessment of Co-morbidities (includes updates in patient status) 1 10 X - Reassessment of Adherence to Treatment Plan 1 5 ASSESSMENTS - Wound and Skin Assessment / Reassessment []  - Simple Wound Assessment / Reassessment - one wound 0 X - Complex Wound Assessment / Reassessment - multiple wounds 2 5 []  - Dermatologic / Skin Assessment (not related to wound area) 0 ASSESSMENTS - Focused Assessment []  - Circumferential Edema Measurements - multi extremities 0 []  - Nutritional Assessment / Counseling / Intervention 0 X - Lower Extremity Assessment (monofilament, tuning fork, pulses) 1 5 []  - Peripheral Arterial Disease Assessment (using hand held doppler) 0 ASSESSMENTS - Ostomy and/or Continence Assessment and Care []  - Incontinence Assessment and Management 0 []  - Ostomy Care Assessment and Management (repouching, etc.) 0 PROCESS - Coordination of Care X - Simple Patient / Family Education for ongoing care 1 15 []  - Complex (extensive) Patient / Family Education for ongoing care 0 X - Staff obtains Programmer, systems, Records, Test Results / Process Orders 1 10 []  - Staff telephones HHA, Nursing Homes / Clarify orders / etc 0 []  - Routine Transfer to another Facility (non-emergent condition) 0 []  - Routine Hospital Admission (non-emergent condition) 0 []  - New Admissions / Biomedical engineer / Ordering NPWT, Apligraf, etc. 0 []  - Emergency Hospital Admission (emergent condition) 0 X - Simple Discharge Coordination 1 10 []  - Complex (extensive) Discharge Coordination 0 PROCESS - Special Needs []  - Pediatric / Minor Patient Management 0 []  - Isolation Patient Management 0 []  - Hearing / Language / Visual special needs 0 []  - Assessment of Community assistance (transportation, D/C planning, etc.) 0 []  - Additional assistance / Altered mentation  0 []  - Support Surface(s) Assessment (bed, cushion, seat, etc.) 0 INTERVENTIONS - Wound Cleansing / Measurement []  - Simple Wound Cleansing - one wound 0 X -  Complex Wound Cleansing - multiple wounds 2 5 X - Wound Imaging (photographs - any number of wounds) 1 5 []  - Wound Tracing (instead of photographs) 0 []  - Simple Wound Measurement - one wound 0 X - Complex Wound Measurement - multiple wounds 2 5 INTERVENTIONS - Wound Dressings []  - Small Wound Dressing one or multiple wounds 0 []  - Medium Wound Dressing one or multiple wounds 0 X - Large Wound Dressing one or multiple wounds 2 20 []  - Application of Medications - topical 0 []  - Application of Medications - injection 0 INTERVENTIONS - Miscellaneous []  - External ear exam 0 []  - Specimen Collection (cultures, biopsies, blood, body fluids, etc.) 0 []  - Specimen(s) / Culture(s) sent or taken to Lab for analysis 0 []  - Patient Transfer (multiple staff / Civil Service fast streamer / Similar devices) 0 []  - Simple Staple / Suture removal (25 or less) 0 []  - Complex Staple / Suture removal (26 or more) 0 []  - Hypo / Hyperglycemic Management (close monitor of Blood Glucose) 0 []  - Ankle / Brachial Index (ABI) - do not check if billed separately 0 X - Vital Signs 1 5 Has the patient been seen at the hospital within the last three years: Yes Total Score: 135 Level Of Care: New/Established - Level 4 Electronic Signature(s) Signed: 12/30/2018 5:32:06 PM By: Levan Hurst RN, BSN Entered By: Levan Hurst on 12/29/2018 13:28:16 -------------------------------------------------------------------------------- Encounter Discharge Information Details Patient Name: Date of Service: Carlos White 12/29/2018 12:30 PM Medical Record [id number] Patient Account Number: [id number] Date of Birth/Sex: Treating RN: 10-30-1940 (78 y.o. [name] Primary Care Kalan Yeley: [name] Other Clinician: Referring Shakayla Hickox: Treating  Vester Titsworth/Extender:Stone III, Sharen Hint, MARY Weeks in Treatment: 10 Encounter Discharge Information Items Discharge Condition: Stable Ambulatory Status: Ambulatory Discharge Destination: Home Transportation: Private Auto Accompanied By: self Schedule Follow-up Appointment: Yes Clinical Summary of Care: Electronic Signature(s) Signed: 12/29/2018 5:21:15 PM By: [name] Entered By: [name] on 12/29/2018 13:38:58 -------------------------------------------------------------------------------- Lower Extremity Assessment Details Patient Name: Date of Service: AHMAUD, DUTHIE 12/29/2018 12:30 PM Medical Record [id number] Patient Account Number: [id number] Date of Birth/Sex: Treating RN: 1940-06-09 (78 y.o. [name] Primary Care Hamad Whyte: [name] Other Clinician: Referring Dovie Kapusta: Treating Alisi Lupien/Extender:Stone III, Sharen Hint, MARY Weeks in Treatment: 94 Edema Assessment Assessed: [Left: No] [Right: No] Edema: [Left: Yes] [Right: Yes] Calf Left: Right: Point of Measurement: 33 cm From Medial Instep 36 cm 38 cm Ankle Left: Right: Point of Measurement: 12 cm From Medial Instep 25.5 cm 26 cm Vascular Assessment Pulses: Dorsalis Pedis Palpable: [Left:Yes] [Right:Yes] Electronic Signature(s) Signed: 12/30/2018 5:33:40 PM By: Kela Millin Entered By: Kela Millin on 12/29/2018 12:55:16 -------------------------------------------------------------------------------- Multi-Disciplinary Care Plan Details Patient Name: Date of Service: Carlos White 12/29/2018 12:30 PM Medical Record [id number] Patient Account Number: [id number] Date of Birth/Sex: Treating RN: February 02, 1940 (78 y.o. Janyth Contes Primary Care Meliya Mcconahy: [name] Other Clinician: Referring Tanyah Debruyne: Treating Brittane Grudzinski/Extender:Stone III, Sharen Hint, MARY Weeks in Treatment: 13 Active Inactive Venous Leg Ulcer Nursing Diagnoses: Knowledge deficit  related to disease process and management Potential for venous Insuffiency (use before diagnosis confirmed) Goals: Patient will maintain optimal edema control Date Initiated: 09/30/2017 Target Resolution Date: 01/28/2019 Goal Status: Active Patient/caregiver will verbalize understanding of disease process and disease management Date Inactivated: 11/04/2017 Target Resolution Date Initiated: 09/30/2017 Date: 10/30/2017 Goal Status: Met Interventions: Assess peripheral edema status every visit. Provide education on venous insufficiency Notes: Wound/Skin Impairment Nursing Diagnoses: Impaired tissue integrity Knowledge deficit related to ulceration/compromised skin  integrity Goals: Patient/caregiver will verbalize understanding of skin care regimen Date Initiated: 09/30/2017 Target Resolution Date: 01/28/2019 Goal Status: Active Ulcer/skin breakdown will have a volume reduction of 30% by week 4 Date Inactivated: 11/04/2017 Target10/18/2019 Resolution Date Initiated: 09/30/2017 Date: Goal Status: Met Interventions: Assess patient/caregiver ability to obtain necessary supplies Assess patient/caregiver ability to perform ulcer/skin care regimen upon admission and as needed Assess ulceration(s) every visit Provide education on ulcer and skin care Notes: Electronic Signature(s) Signed: 12/30/2018 5:32:06 PM By: Levan Hurst RN, BSN Entered By: Levan Hurst on 12/29/2018 13:26:58 -------------------------------------------------------------------------------- Pain Assessment Details Patient Name: Date of Service: Carlos White 12/29/2018 12:30 PM Medical Record [id number] Patient Account Number: [id number] Date of Birth/Sex: Treating RN: Sep 02, 1940 (78 y.o. [name] Primary Care Deidrick Rainey: [name] Other Clinician: Referring Davyon Fisch: Treating Genevia Bouldin/Extender:Stone III, Sharen Hint, MARY Weeks in Treatment: 53 Active Problems Location of Pain Severity  and Description of Pain Patient Has Paino No Site Locations Pain Management and Medication Current Pain Management: Electronic Signature(s) Signed: 12/30/2018 5:33:40 PM By: Kela Millin Entered By: Kela Millin on 12/29/2018 12:52:44 -------------------------------------------------------------------------------- Patient/Caregiver Education Details Patient Name: Carlos White 12/16/2020andnbsp12:30 Date of Service: PM Medical Record [id number] Number: Patient Account Number: [id number] Treating RN: 05-06-1940 (78 y.o. Levan Hurst Date of Birth/Gender: M) Other Clinician: Primary Care Physician: [name] Treating Worthy Keeler Referring Physician: Physician/Extender: Assunta Gambles in Treatment: 75 Education Assessment Education Provided To: Patient Education Topics Provided Venous: Methods: Explain/Verbal Responses: State content correctly Wound/Skin Impairment: Methods: Explain/Verbal Responses: State content correctly Electronic Signature(s) Signed: 12/30/2018 5:32:06 PM By: Levan Hurst RN, BSN Entered By: Levan Hurst on 12/29/2018 13:27:13 -------------------------------------------------------------------------------- Wound Assessment Details Patient Name: Date of Service: Carlos White 12/29/2018 12:30 PM Medical Record [id number] Patient Account Number: [id number] Date of Birth/Sex: Treating RN: Dec 10, 1940 (78 y.o. [name] Primary Care Britain Saber: [name] Other Clinician: Referring Paw Karstens: Treating Magaly Pollina/Extender:Stone III, Sharen Hint, MARY Weeks in Treatment: 17 Wound Status Wound Number: 2 Primary Venous Leg Ulcer Etiology: Wound Location: Left Lower Leg - Lateral, Distal Wound Open Wounding Event: Gradually Appeared Status: Date Acquired: 03/13/2017 Comorbid Cataracts, Chronic Obstructive Pulmonary Weeks Of Treatment: 65 History: Disease (COPD), Hypertension Clustered Wound:  No Photos Wound Measurements Length: (cm) 16.4 % Reduct Width: (cm) 15.5 % Reduct Depth: (cm) 0.1 Epitheli Area: (cm) 199.648 Tunneli Volume: (cm) 19.965 Undermi Wound Description Classification: Full Thickness Without Exposed Support Foul Od Structures Slough/ Wound Flat and Intact Margin: Exudate Medium Amount: Exudate Serosanguineous Type: Exudate red, brown Color: Wound Bed Granulation Amount: Large (67-100%) Granulation Quality: Red, Hyper-granulation Fascia E Necrotic Amount: Small (1-33%) Fat Laye Necrotic Quality: Adherent Slough Tendon E Muscle E Joint Ex Bone Exp or After Cleansing: No Fibrino Yes Exposed Structure xposed: No r (Subcutaneous Tissue) Exposed: Yes xposed: No xposed: No posed: No osed: No ion in Area: -157.5% ion in Volume: 48.5% alization: Small (1-33%) ng: No ning: No Treatment Notes Wound #2 (Left, Distal, Lateral Lower Leg) 1. Cleanse With Wound Cleanser Soap and water 2. Periwound Care Barrier cream Moisturizing lotion 3. Primary Dressing Applied Collagen 4. Secondary Dressing ABD Pad Roll Gauze Kerramax/Xtrasorb 6. Support Layer Education officer, community) Signed: 12/30/2018 3:39:50 PM By: Mikeal Hawthorne EMT/HBOT Signed: 12/30/2018 5:33:40 PM By: Kela Millin Entered By: Mikeal Hawthorne on 12/30/2018 14:15:44 -------------------------------------------------------------------------------- Wound Assessment Details Patient Name: Date of Service: Carlos White 12/29/2018 12:30 PM Medical Record [id number] Patient Account Number: [id number] Date of Birth/Sex: Treating RN: 09/21/1940 (78 y.o. [name] Primary  Care Delcenia Inman: [name] Other Clinician: Referring Sohan Potvin: Treating Hakeen Shipes/Extender:Stone III, Sharen Hint, MARY Weeks in Treatment: 109 Wound Status Wound Number: 5 Primary Vasculopathy Etiology: Wound Location: Right Lower Leg - Lateral, Proximal Wound  Open Status: Wounding Event: Gradually Appeared Comorbid Cataracts, Chronic Obstructive Pulmonary Date Acquired: 10/26/2017 History: Disease (COPD), Hypertension Weeks Of Treatment: 61 Clustered Wound: No Photos Wound Measurements Length: (cm) 13 % Reduc Width: (cm) 12.5 % Reduc Depth: (cm) 0.1 Epithel Area: (cm) 127.627 Tunnel Volume: (cm) 12.763 Underm Wound Description Classification: Full Thickness Without Exposed Support Foul O Structures Slough Wound Flat and Intact Margin: Exudate Medium Amount: Exudate Serosanguineous Type: Exudate red, brown Color: Wound Bed Granulation Amount: Large (67-100%) Granulation Quality: Red, Hyper-granulation Fascia Ex Necrotic Amount: Small (1-33%) Fat Layer Necrotic Quality: Adherent Slough Tendon Ex Muscle Ex Joint Exp Bone Expo dor After Cleansing: No /Fibrino Yes Exposed Structure posed: No (Subcutaneous Tissue) Exposed: Yes posed: No posed: No osed: No sed: No tion in Area: -135.1% tion in Volume: -135.1% ialization: Small (1-33%) ing: No ining: No Treatment Notes Wound #5 (Right, Proximal, Lateral Lower Leg) 1. Cleanse With Wound Cleanser Soap and water 2. Periwound Care Barrier cream Moisturizing lotion 3. Primary Dressing Applied Collagen 4. Secondary Dressing ABD Pad Roll Gauze Kerramax/Xtrasorb 6. Support Layer Education officer, community) Signed: 12/30/2018 3:39:50 PM By: Mikeal Hawthorne EMT/HBOT Signed: 12/30/2018 5:33:40 PM By: Kela Millin Entered By: Mikeal Hawthorne on 12/30/2018 14:16:08 -------------------------------------------------------------------------------- Vitals Details Patient Name: Date of Service: Carlos White 12/29/2018 12:30 PM Medical Record [id number] Patient Account Number: [id number] Date of Birth/Sex: Treating RN: [birth year] (78 y.o. [name] Primary Care Kya Mayfield: [name] Other Clinician: Referring Marvis Saefong: Treating  Mailee Klaas/Extender:Stone III, Sharen Hint, MARY Weeks in Treatment: 78 Vital Signs Time Taken: 12:45 Temperature (F): 97.7 Height (in): 71 Pulse (bpm): 64 Weight (lbs): 220 Respiratory Rate (breaths/min): 18 Body Mass Index (BMI): 30.7 Blood Pressure (mmHg): 171/67 Reference Range: 80 - 120 mg / dl Electronic Signature(s) Signed: 12/30/2018 5:33:40 PM By: Kela Millin Entered By: Kela Millin on 12/29/2018 12:52:38

## 2019-01-12 LAB — HM DIABETES EYE EXAM

## 2019-01-13 ENCOUNTER — Other Ambulatory Visit: Payer: Self-pay | Admitting: Internal Medicine

## 2019-01-18 ENCOUNTER — Encounter: Payer: Self-pay | Admitting: Internal Medicine

## 2019-01-26 ENCOUNTER — Encounter (HOSPITAL_BASED_OUTPATIENT_CLINIC_OR_DEPARTMENT_OTHER): Payer: Medicare PPO | Attending: Physician Assistant | Admitting: Physician Assistant

## 2019-01-26 ENCOUNTER — Other Ambulatory Visit: Payer: Self-pay

## 2019-01-26 DIAGNOSIS — L97822 Non-pressure chronic ulcer of other part of left lower leg with fat layer exposed: Secondary | ICD-10-CM | POA: Insufficient documentation

## 2019-01-26 DIAGNOSIS — I10 Essential (primary) hypertension: Secondary | ICD-10-CM | POA: Diagnosis not present

## 2019-01-26 DIAGNOSIS — Q819 Epidermolysis bullosa, unspecified: Secondary | ICD-10-CM | POA: Diagnosis not present

## 2019-01-26 DIAGNOSIS — L97812 Non-pressure chronic ulcer of other part of right lower leg with fat layer exposed: Secondary | ICD-10-CM | POA: Insufficient documentation

## 2019-01-26 DIAGNOSIS — I872 Venous insufficiency (chronic) (peripheral): Secondary | ICD-10-CM | POA: Insufficient documentation

## 2019-01-26 DIAGNOSIS — L97322 Non-pressure chronic ulcer of left ankle with fat layer exposed: Secondary | ICD-10-CM | POA: Insufficient documentation

## 2019-01-26 DIAGNOSIS — L97222 Non-pressure chronic ulcer of left calf with fat layer exposed: Secondary | ICD-10-CM | POA: Diagnosis not present

## 2019-01-26 NOTE — Progress Notes (Addendum)
SEDDRICK, MORASKI ([id number]) Visit Report for 01/26/2019 Chief Complaint Document Details Patient Name: Date of Service: Carlos White, Carlos White 01/26/2019 12:30 PM Medical Record [id number] Patient Account Number: [id number] Date of Birth/Sex: Treating RN: [birth year] (79 y.o. M) Primary Care Provider: [name] Other Clinician: Referring Provider: Treating Provider/Extender:Stone III, Sharen Hint, MARY Weeks in Treatment: 60 Information Obtained from: Patient Chief Complaint Bilateral LE Ulcers Electronic Signature(s) Signed: 01/26/2019 1:28:43 PM By: Worthy Keeler PA-C Entered By: Worthy Keeler on 01/26/2019 13:28:43 -------------------------------------------------------------------------------- HPI Details Patient Name: Date of Service: Carlos White 01/26/2019 12:30 PM Medical Record [id number] Patient Account Number: [id number] Date of Birth/Sex: Treating RN: December 18, 1940 (79 y.o. M) Primary Care Provider: [name] Other Clinician: Referring Provider: Treating Provider/Extender:Stone III, Sharen Hint, MARY Weeks in Treatment: 57 History of Present Illness HPI Description: 09/30/17 on evaluation today patient presents for initial evaluation and our clinic concerning issues that he has been having with his bilateral lower extremities. He states this has been going on for quite some time at least six months. Currently his regiment has been mainly cleaning the area with peroxide, applying the is foreign ointment, and wrapping the area with ABD pads and then an ace wrap loosely. He has dealt with issues of this nature he tells me for quite some time. He does have a history of having had a compound fracture of the left lower extremity which he thinks also makes this a much more difficult area for him to heal. He's previously been told that he had poor vascular flow but this was years ago at [facility] we do not have any of those records at this time. He has a history of  Epidermolysis Bullosa which was diagnosed around age 46 and he has been cared for at [facility] since that time. Subsequently he states this is hereditary and two of his children one male and one male also have this as well is one of his grandchildren that he is aware of. He has no evidence of infection necessarily at this point although he does have some necrotic tissue noted on the surface of the wound as far as the largest, left lateral lower extremity ulcer, is concerned. Overall I feel like all things considered he's been taking care of this very well. Obviously he has some fairly significant issues going on at this point in this regard. He does have a history otherwise of hypertension though for the most part other than the compound fracture of the left leg he seems to have been fairly healthy in my pinion. 10/07/17 on evaluation today patient actually appears to be doing better in regard to his bilateral lower extremity ulcers. With that being said he does still have some evidence of slough noted on the surface of the wounds I think the Iodoflex has been beneficial for him. His arterial studies are scheduled for October 2. With that being said I do believe that he is continuing to show signs of good improvement which is at least good news. 10/14/17 on evaluation today patient appears to be doing very well in regard to his lower extremity ulcers. He's definitely made some progress as far as healing is concerned although there still are several open areas that are going to need to be addressed. He did have his arterial study today which fortunately shows good findings with a right ABI of 1.23 with a TBI of 0.86 in the left ABI of 1.28 with a TBI of 0.81. This is good news and will allow  [place] to perform debridement as well. 10/23/2017; patient with a large wound on the left lateral calf, sizable area on the left medial malleolus and an area on the right lateral malleolus. He has a new blister consistent  with his underlying blistering skin disease just above this area we have been using Iodoflex on the lateral left calf lateral right ankle and collagen on the medial left ankle. We have been using Kerlix Coban wraps 10/28/17 on evaluation today the patient continues to have signs of improvement in regard to the overall appearance of the original wound. Unfortunately he did have some blistering over the right lateral lower extremity which has appeared to rupture on evaluation today and likely some of the dead tissue on the surface needs to be cleaned away the good news is this does not appear to be to significantly deep at this time. 11/04/17 on evaluation today patient actually appears to be doing a little better in regard to his lower extremity ulcers. He has been tolerating the dressing changes without complication. With that being said he does still have a significant wound especially over the left lateral lower extremity unfortunately. All of the wounds pretty much are going to require sharp debridement today. 11/11/17 on evaluation today patient appears to be doing more poorly in regard to his left lower extremity in particular. There does not appear to be any evidence of systemic infection although the wound itself as far as the larger left lateral lower extremity ulcer actually appears to be infected in my pinion. There's an older and the surface of the wound is dramatically worsened compared to last week. No fevers, chills, nausea, or vomiting noted at this time. 11/18/17 upon evaluation today patient actually appears to be doing better. I did review his culture today which really did not show any specific organism is a positive reason for his wound decline. There are multiple organisms present not predominant. Nonetheless he seems to be tolerate the doxycycline well in his wounds in general do seem to be doing better. Fortunately there does not appear to be any evidence of infection at this  time which is good news. Overall I'm very pleased with how things appear. Nonetheless he still has a lot of healing to go. I do think he could benefit from a Juxta-Lite wrap. 11/25/17 on evaluation today patient actually appears to be doing fairly well in regard to his wounds. He is still taking the antibiotics he has a few days left. Fortunately this seems to have been excellent for him as far as getting the infection control and very happy in this regard. With that being said the patient likewise is also very pleased with how things appear at this time in comparison to where we were he's not having as much pain. 12/02/17 Seen today for follow p and management of LLE wounds. Wounds appear to show some improvement. He denies pain, fever, or chills. Completed a course of doxycycline earlier this month. Scheduled to received Juxta-Lite wrap this week. No s/s of infections. 12/09/17 on evaluation today patient actually appears to be doing a little bit better in regard to his wounds. This is obscene very slow process and unfortunately he has a couple of new areas and this is due to the Epidermolysis Bullosa. Nonetheless I am concerned about the fact that he seems to be getting more areas not less that is the reason we're gonna work on getting schedule for the vascular referral to see the venous specialist. 12/23/17 upon evaluation  today patient's wounds currently shows evidence of still not doing quite as well is what I would like to have seen. Subsequently the patient did have his venous study which showed evidence of venous stasis. Subsequently I do think that a vascular evaluation for consideration of venous intervention would be appropriate. I'm not necessarily suggesting that will be anything that can be done but I think it is at least a good idea. He is in agreement with this plan. 12/30/17 on evaluation today patient actually appears to be doing very well in regard to his wounds when  compared to previous evaluation. Subsequently we have been using the [facility] Dressing which actually appears to have done excellent on his left lateral lower extremity ulcer. The quality of the wound surface is dramatically improved. There is some slight debridement that is going to be required at a couple of locations but overall I'm extremely pleased with how things appear here. 01/07/2018; this is a patient with a primary skin disorder epidermolyis bullosa. Is a large wound on the left lateral calf and smaller wounds on the right however there is a new wound on the right mid tibia area that occurred within the compression wrap that he did not change. We have been using Hydrofera Blue. On the left he is using Hydrofera Blue and Santyl to the inferior part of the wound and changing the dressing himself. 01/15/2018; primary skin disorder epidermolysis bullosa. He has several difficult wounds including the left lateral calf, smaller wounds on the left medial calf and the right lateral calf. The major area on the left lateral calf has a smaller area inferiorly that has necrotic debris we have been using Santyl to this. The rest of the wounds we have been using Hydrofera Blue. The area on the left calf actually looks larger this week. Uncontrolled edema several small open areas above it that are superficial 01/20/18 on evaluation today patient appears to be doing better as compared to last week in regard to his wounds of the bilateral lower extremities. He tolerated the bilateral compression wrap without complication. Overall I'm very pleased with how things appear at this time. The patient likewise is very happy. 01/27/18 on evaluation today patient appears to be doing decently well in regard to his bilateral lower extremity ulcers. He's been tolerating the dressing changes without complication. One issue he had is that he did have more drainage to the left leg wrapped last week. He states in fact  he probably should come in and let us change it on Friday however he just left it in place and kept adding extra absorption with ABD pads to the external portion of the wrap. Unfortunately he does have some aspiration type breakdown nothing significant but I do believe that this was probably counterproductive in general. Nonetheless his wounds do not appear to be terrible overall. 02/03/18 on evaluation today patient appears to be doing rather well in regard to his lower extremity ulcers. He has been tolerating the dressing changes without complication. He does tell me that he had to change the wrap on the left one since we last saw him. Subsequently I do not see any evidence of infection I do feel like the food was much better controlled at this point. 02/10/18 on evaluation today patient appears to be doing rather well in regard to his ulcers. He still has significant alterations especially on the left lateral lower extremity. Fortunately there's no signs of infection at this time. Overall I feel like he  is making good progress are some areas that I'm gonna attempt some debridement today. 02/17/18 on evaluation today patient appears to be doing okay in regard to his lower Trinity ulcer. It does appear on both locations he has a little bit of drainage causing some breakdown in maceration around the wound bed's although it doesn't appear to be too bad the right is a little bit worse than left. Fortunately there's no signs of infection which is good news. No fevers, chills, nausea, or vomiting noted at this time. 03/10/18 on evaluation today patient actually appears to be doing rather poorly in regard to his bilateral lower extremity ulcers. The right in particular is draining profusely and the wound is actually enlarging which is not good. I'm concerned about both possibly infection and the fact that there's a lot of moisture which is causing breakdown as well. Unfortunately the patient has been trying  to change this at home I'm afraid he may need to change more frequently in order to see the improvement that were looking for. There's no signs of systemic infection. 03/17/18 patient actually appears to be doing significantly better at this point in regard to his bilateral lower extremity ulcers. Fortunately there's no signs of infection. That is worsening infection at least indefinitely nothing systemic. With that being said he is having a lot of drainage though not quite as much is there in his last evaluation. Overall feel like he's on the side of improvement. I think if his results back from his culture which showed that he had a positive group B strep along with abundant Pseudomonas noted on the culture. For that reason I am gonna have him continue with the linezolid as we previously have ordered for him and I did go ahead as well today and prescribe Levaquin as well in order to treat the Pseudomonas portion of the infection noted. 03/24/18 on evaluation today patient actually appears to be doing very well in regard to his lower Trinity ulcer. He's been tolerating the dressing changes without complication. Fortunately both legs show signs of less drainage in his edema is very well controlled at this point as well. Overall very pleased with how things seem to be progressing. 03/31/18 on evaluation today patient actually appears to be doing excellent in regard to his bilateral lower extremity ulcers. These are not draining nearly as significant as what they were in the past overall seem to be shown signs of excellent improvement which is great news. Fortunately there is no sign of active infection at this time I do believe that the Levaquin has done extremely well for him in this regard. The patient continues to change these at home typically every day. We may be able to slowly work towards every other day changes since the drainage seems to be slowing down quite significantly. 04/14/18 on evaluation  today patient appears to be doing well in regard to his bilateral lower extremities. Let me Hilda Blades Almost completely healed which is excellent news. Fortunately he's shown signs of improvement all other sites as well with new skin growth there's some slight hyper granular tissue but for the most part this seems to be well maintained with the [facility] Dressing. I'm very happy in this regard. 04/28/18 on evaluation today patient appears to be doing rather well in regard to his ulcers of the bilateral lower extremities all things considering. He continues to make some progress as far as new skin growth. There still some hyper granulation noted at this point despite  the use of the [facility] Dressing. This is not terrible but I think we may want to consider conclude cauterization today with silver nitrate to try to help knock some of his back as well as helping with any biofilm on the surface of the wound. 05/12/18 on evaluation today patient's wounds actually appear to be doing fairly well in regard to the bilateral lower extremities. He's been tolerating the dressing changes without complication. Fortunately there's no signs of active infection at this time which is good news. Overall very pleased with how things seem to be progressing. You select silver nitrate was beneficial for him. 05/26/18 on evaluation today patient appears to be doing better in regard to left lower extremity and a little bit worse in regard to the right lower extremity. He states that he was pulling off the [facility] Dressing peel back some of the skin making this area significantly larger than what it was previous. He's not had any issues other than this and states even that hasn't caused any pain he just seems to obviously have a much larger area on the right when compared to the previous time I saw him. No fevers, chills, nausea, or vomiting noted at this time. 06/16/18 on evaluation today patient actually  appears to be doing a little better in my pinion in regard to his lower summary ulcers. He has new skin islands that seem to be spreading which is good news. Fortunately there's no signs of active infection at this time. His biggest issue is he tells me that coming as often as he does is becoming very cost prohibitive. He wonders if we can potentially spread this out. 07/14/18 on evaluation today patient appears to be doing a little bit worse in regard to his lower from the ulcer. Unfortunately he still continues to have a significant amount of drainage I think we need to do something to try to help this more. He is still somewhat reluctant to go the route of the Wound VAC although that may be the most appropriate thing for him. No fevers, chills, nausea, or vomiting noted at this time. 08/18/2018 on evaluation today patient actually appears to be doing quite well with regard to his bilateral lower extremity ulcers. I do feel like that currently he is making great progress the care max does seem to be doing a great job at helping to control the moisture he has no maceration or skin breakdown. Again this seems to be an excellent way to go. 1 thing we may want to change is adding collagen to the base of the wound and then the care max over top he is not opposed to this. 09/15/2018 on evaluation today patient appears to be doing well with regard to his bilateral lower extremity ulcers. He is showing some signs of improvement not necessarily in size but definitely in appearance. In fact he has a lot of new skin growing throughout the wounds along the edges as well as in the central portion of the wounds on both lower extremities. Overall I am extremely pleased to see this. 10/20/2018 on evaluation today patient actually appears to be doing quite well with regard to his wounds. They are not measuring significantly smaller but he does have a lot of new epithelization noted as compared to previous. Fortunately  there is no signs of active infection at this time. No fevers, chills, nausea, vomiting, or diarrhea. 11/17/2018 on evaluation today patient presents for reevaluation concerning his bilateral lower extremity ulcers. Fortunately  there is no signs of active infection at this time today. He has been tolerating the dressing changes without complication. No fevers, chills, nausea, vomiting, or diarrhea. Unfortunately in general the patient has not made as much improvement as I would like to have seen up to this point. He has been tolerating the dressing changes without complication and he does an excellent job taking care of his wounds at home in my opinion. The biggest issue I see is that he is just not making the progress that we need to be seeing currently. I think we may want to consider having him seen at a plastic surgery appointment and he has previously seen someone in years past at [facility] in [place]. That is definitely a possibility for [place] to look into at this point. 12/29/2018 on evaluation today patient appears to be doing better in regard to the overall visual appearance of his wounds which do not appear to be as macerated. He does have a much larger skin island in the middle of the left lower extremity ulcer which is doing much better. He tells me the pain is also significantly better. With that being said overall his improvement as far as the size of the wounds is not better but again these are very irregular in change shape quite often. Fortunately there is no evidence of active infection at this time which is great news. He never heard anything from [facility] regarding the plastic surgery referral that we made to them. 01/26/2019 upon evaluation today patient appears to be doing a little better in regard to his wounds today. He has been tolerating the dressing changes again he performs these for the most part on his own. He does a great job wrapping his legs in my  opinion. Unfortunately he has not been able to get down to [facility] to see if there is anything from a plastic surgery standpoint that could be done to help with his legs simply due to the fact that his wife unfortunately sustained a compression fracture in her spine she is seeing Dr. Saintclair Halsted and subsequently is going to be having what sounds to be a kyphoplasty type procedure. With that being said that has not been scheduled yet there is still waiting on an MRI the patient is very busy in fact overly busy trying to help take care of his wife at this point. I completely understand this is more of a strain on him at this time Electronic Signature(s) Signed: 01/26/2019 1:52:39 PM By: Worthy Keeler PA-C Entered By: Worthy Keeler on 01/26/2019 13:52:39 -------------------------------------------------------------------------------- Physical Exam Details Patient Name: Date of Service: Carlos White, Carlos White 01/26/2019 12:30 PM Medical Record [id number] Patient Account Number: [id number] Date of Birth/Sex: Treating RN: 1940/12/18 (79 y.o. M) Primary Care Provider: [name] Other Clinician: Referring Provider: Treating Provider/Extender:Stone III, Sharen Hint, MARY Weeks in Treatment: 33 Constitutional Well-nourished and well-hydrated in no acute distress. Respiratory normal breathing without difficulty. Psychiatric this patient is able to make decisions and demonstrates good insight into disease process. Alert and Oriented x 3. pleasant and cooperative. Notes Patient's wound bed currently showed signs of good granulation for the most part he does have good epithelization as well. I think that the wounds are progressing although is very slow. Unfortunately I think that his lack of more significant progress comes back down to the epidermolysis bullosa which has plagued him his entire life. I think that is making this somewhat more difficult even than average to heal. Electronic  Signature(s) Signed: 01/26/2019 1:53:38 PM By: Worthy Keeler PA-C Entered By: Worthy Keeler on 01/26/2019 13:53:38 -------------------------------------------------------------------------------- Physician Orders Details Patient Name: Date of Service: Carlos White, Carlos White 01/26/2019 12:30 PM Medical Record [id number] Patient Account Number: [id number] Date of Birth/Sex: Treating RN: 04/19/1940 (79 y.o. [name] Primary Care Provider: [name] Other Clinician: Referring Provider: Treating Provider/Extender:Stone III, Sharen Hint, MARY Weeks in Treatment: 68 Verbal / Phone Orders: No Diagnosis Coding ICD-10 Coding Code Description I87.2 Venous insufficiency (chronic) (peripheral) Q81.8 Other epidermolysis bullosa L97.322 Non-pressure chronic ulcer of left ankle with fat layer exposed L97.822 Non-pressure chronic ulcer of other part of left lower leg with fat layer exposed L97.812 Non-pressure chronic ulcer of other part of right lower leg with fat layer exposed [place] (primary) hypertension Follow-up Appointments Return appointment in 1 month. Dressing Change Frequency Wound #2 Left,Distal,Lateral Lower Leg Change Dressing every other day. Wound #5 Right,Proximal,Lateral Lower Leg Change Dressing every other day. Skin Barriers/Peri-Wound Care Barrier cream - to periwound areas Moisturizing lotion - both legs with dressing changes Wound Cleansing Wound #2 Left,Distal,Lateral Lower Leg May shower and wash wound with soap and water. - with dressing changes Wound #5 Right,Proximal,Lateral Lower Leg May shower and wash wound with soap and water. - with dressing changes Primary Wound Dressing Wound #2 Left,Distal,Lateral Lower Leg Collagen - fibracol - do not need to moisten Wound #5 Right,Proximal,Lateral Lower Leg Collagen - fibracol - do not need to moisten Secondary Dressing Wound #2 Left,Distal,Lateral Lower Leg Kerlix/Rolled Gauze ABD pad - all  wounds Kerramax - or xtrasorb Wound #5 Right,Proximal,Lateral Lower Leg Kerlix/Rolled Gauze ABD pad - all wounds Kerramax Edema Control Avoid standing for long periods of time Elevate legs to the level of the heart or above for 30 minutes daily and/or when sitting, a frequency of: - throughout the day Support Garment 20-30 mm/Hg pressure to: - patient to apply juxtalites daily to both legs over ace wraps Other: - ACE wrap both legs Electronic Signature(s) Signed: 01/26/2019 5:55:19 PM By: Worthy Keeler PA-C Signed: 01/26/2019 6:34:17 PM By: Baruch Gouty RN, BSN Entered By: Baruch Gouty on 01/26/2019 13:37:32 -------------------------------------------------------------------------------- Problem List Details Patient Name: Date of Service: Carlos White 01/26/2019 12:30 PM Medical Record [id number] Patient Account Number: [id number] Date of Birth/Sex: Treating RN: 1940/04/14 (79 y.o. 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/30/2017 No Yes exposed L97.822 Non-pressure chronic ulcer of other part of left lower 09/30/2017 No Yes leg with fat layer exposed L97.812 Non-pressure chronic ulcer of other part of right lower 09/30/2017 No Yes leg with fat layer exposed [place] (primary) hypertension 09/30/2017 No Yes Inactive Problems Resolved Problems Electronic Signature(s) Signed: 01/26/2019 1:28:29 PM By: Worthy Keeler PA-C Entered By: Worthy Keeler on 01/26/2019 13:28:29 -------------------------------------------------------------------------------- Progress Note  Details Patient Name: Date of Service: Carlos White 01/26/2019 12:30 PM Medical Record [id number] Patient Account Number: [id number] Date of Birth/Sex: Treating RN: Aug 20, 1940 (79 y.o. M) Primary Care Provider: [name] Other Clinician: Referring Provider: Treating Provider/Extender:Stone III, Sharen Hint, MARY Weeks in Treatment: 56 Subjective Chief Complaint Information obtained from Patient Bilateral LE Ulcers History of Present Illness (HPI) 09/30/17 on evaluation today patient presents for initial evaluation and our  clinic concerning issues that he has been having with his bilateral lower extremities. He states this has been going on for quite some time at least six months. Currently his regiment has been mainly cleaning the area with peroxide, applying the is foreign ointment, and wrapping the area with ABD pads and then an ace wrap loosely. He has dealt with issues of this nature he tells me for quite some time. He does have a history of having had a compound fracture of the left lower extremity which he thinks also makes this a much more difficult area for him to heal. He's previously been told that he had poor vascular flow but this was years ago at [facility] we do not have any of those records at this time. He has a history of Epidermolysis Bullosa which was diagnosed around age 57 and he has been cared for at [facility] since that time. Subsequently he states this is hereditary and two of his children one male and one male also have this as well is one of his grandchildren that he is aware of. He has no evidence of infection necessarily at this point although he does have some necrotic tissue noted on the surface of the wound as far as the largest, left lateral lower extremity ulcer, is concerned. Overall I feel like all things considered he's been taking care of this very well. Obviously he has some fairly significant issues going on at this point in this regard. He does  have a history otherwise of hypertension though for the most part other than the compound fracture of the left leg he seems to have been fairly healthy in my pinion. 10/07/17 on evaluation today patient actually appears to be doing better in regard to his bilateral lower extremity ulcers. With that being said he does still have some evidence of slough noted on the surface of the wounds I think the Iodoflex has been beneficial for him. His arterial studies are scheduled for October 2. With that being said I do believe that he is continuing to show signs of good improvement which is at least good news. 10/14/17 on evaluation today patient appears to be doing very well in regard to his lower extremity ulcers. He's definitely made some progress as far as healing is concerned although there still are several open areas that are going to need to be addressed. He did have his arterial study today which fortunately shows good findings with a right ABI of 1.23 with a TBI of 0.86 in the left ABI of 1.28 with a TBI of 0.81. This is good news and will allow [place] to perform debridement as well. 10/23/2017; patient with a large wound on the left lateral calf, sizable area on the left medial malleolus and an area on the right lateral malleolus. He has a new blister consistent with his underlying blistering skin disease just above this area we have been using Iodoflex on the lateral left calf lateral right ankle and collagen on the medial left ankle. We have been using Kerlix Coban wraps 10/28/17 on evaluation today the patient continues to have signs of improvement in regard to the overall appearance of the original wound. Unfortunately he did have some blistering over the right lateral lower extremity which has appeared to rupture on evaluation today and likely some of the dead tissue on the surface needs to be cleaned away the good news is this does not appear to be to significantly deep at this time. 11/04/17 on  evaluation  today patient actually appears to be doing a little better in regard to his lower extremity ulcers. He has been tolerating the dressing changes without complication. With that being said he does still have a significant wound especially over the left lateral lower extremity unfortunately. All of the wounds pretty much are going to require sharp debridement today. 11/11/17 on evaluation today patient appears to be doing more poorly in regard to his left lower extremity in particular. There does not appear to be any evidence of systemic infection although the wound itself as far as the larger left lateral lower extremity ulcer actually appears to be infected in my pinion. There's an older and the surface of the wound is dramatically worsened compared to last week. No fevers, chills, nausea, or vomiting noted at this time. 11/18/17 upon evaluation today patient actually appears to be doing better. I did review his culture today which really did not show any specific organism is a positive reason for his wound decline. There are multiple organisms present not predominant. Nonetheless he seems to be tolerate the doxycycline well in his wounds in general do seem to be doing better. Fortunately there does not appear to be any evidence of infection at this time which is good news. Overall I'm very pleased with how things appear. Nonetheless he still has a lot of healing to go. I do think he could benefit from a Juxta-Lite wrap. 11/25/17 on evaluation today patient actually appears to be doing fairly well in regard to his wounds. He is still taking the antibiotics he has a few days left. Fortunately this seems to have been excellent for him as far as getting the infection control and very happy in this regard. With that being said the patient likewise is also very pleased with how things appear at this time in comparison to where we were he's not having as much pain. 12/02/17 Seen today for  follow p and management of LLE wounds. Wounds appear to show some improvement. He denies pain, fever, or chills. Completed a course of doxycycline earlier this month. Scheduled to received Juxta-Lite wrap this week. No s/s of infections. 12/09/17 on evaluation today patient actually appears to be doing a little bit better in regard to his wounds. This is obscene very slow process and unfortunately he has a couple of new areas and this is due to the Epidermolysis Bullosa. Nonetheless I am concerned about the fact that he seems to be getting more areas not less that is the reason we're gonna work on getting schedule for the vascular referral to see the venous specialist. 12/23/17 upon evaluation today patient's wounds currently shows evidence of still not doing quite as well is what I would like to have seen. Subsequently the patient did have his venous study which showed evidence of venous stasis. Subsequently I do think that a vascular evaluation for consideration of venous intervention would be appropriate. I'm not necessarily suggesting that will be anything that can be done but I think it is at least a good idea. He is in agreement with this plan. 12/30/17 on evaluation today patient actually appears to be doing very well in regard to his wounds when compared to previous evaluation. Subsequently we have been using the [facility] Dressing which actually appears to have done excellent on his left lateral lower extremity ulcer. The quality of the wound surface is dramatically improved. There is some slight debridement that is going to be required at a couple of locations but overall  I'm extremely pleased with how things appear here. 01/07/2018; this is a patient with a primary skin disorder epidermolyis bullosa. Is a large wound on the left lateral calf and smaller wounds on the right however there is a new wound on the right mid tibia area that occurred within the compression wrap that he did  not change. We have been using Hydrofera Blue. On the left he is using Hydrofera Blue and Santyl to the inferior part of the wound and changing the dressing himself. 01/15/2018; primary skin disorder epidermolysis bullosa. He has several difficult wounds including the left lateral calf, smaller wounds on the left medial calf and the right lateral calf. The major area on the left lateral calf has a smaller area inferiorly that has necrotic debris we have been using Santyl to this. The rest of the wounds we have been using Hydrofera Blue. The area on the left calf actually looks larger this week. Uncontrolled edema several small open areas above it that are superficial 01/20/18 on evaluation today patient appears to be doing better as compared to last week in regard to his wounds of the bilateral lower extremities. He tolerated the bilateral compression wrap without complication. Overall I'm very pleased with how things appear at this time. The patient likewise is very happy. 01/27/18 on evaluation today patient appears to be doing decently well in regard to his bilateral lower extremity ulcers. He's been tolerating the dressing changes without complication. One issue he had is that he did have more drainage to the left leg wrapped last week. He states in fact he probably should come in and let us change it on Friday however he just left it in place and kept adding extra absorption with ABD pads to the external portion of the wrap. Unfortunately he does have some aspiration type breakdown nothing significant but I do believe that this was probably counterproductive in general. Nonetheless his wounds do not appear to be terrible overall. 02/03/18 on evaluation today patient appears to be doing rather well in regard to his lower extremity ulcers. He has been tolerating the dressing changes without complication. He does tell me that he had to change the wrap on the left one since we last saw him. Subsequently  I do not see any evidence of infection I do feel like the food was much better controlled at this point. 02/10/18 on evaluation today patient appears to be doing rather well in regard to his ulcers. He still has significant alterations especially on the left lateral lower extremity. Fortunately there's no signs of infection at this time. Overall I feel like he is making good progress are some areas that I'm gonna attempt some debridement today. 02/17/18 on evaluation today patient appears to be doing okay in regard to his lower Trinity ulcer. It does appear on both locations he has a little bit of drainage causing some breakdown in maceration around the wound bed's although it doesn't appear to be too bad the right is a little bit worse than left. Fortunately there's no signs of infection which is good news. No fevers, chills, nausea, or vomiting noted at this time. 03/10/18 on evaluation today patient actually appears to be doing rather poorly in regard to his bilateral lower extremity ulcers. The right in particular is draining profusely and the wound is actually enlarging which is not good. I'm concerned about both possibly infection and the fact that there's a lot of moisture which is causing breakdown as well. Unfortunately the patient has  been trying to change this at home I'm afraid he may need to change more frequently in order to see the improvement that were looking for. There's no signs of systemic infection. 03/17/18 patient actually appears to be doing significantly better at this point in regard to his bilateral lower extremity ulcers. Fortunately there's no signs of infection. That is worsening infection at least indefinitely nothing systemic. With that being said he is having a lot of drainage though not quite as much is there in his last evaluation. Overall feel like he's on the side of improvement. I think if his results back from his culture which showed that he had a positive group B  strep along with abundant Pseudomonas noted on the culture. For that reason I am gonna have him continue with the linezolid as we previously have ordered for him and I did go ahead as well today and prescribe Levaquin as well in order to treat the Pseudomonas portion of the infection noted. 03/24/18 on evaluation today patient actually appears to be doing very well in regard to his lower Trinity ulcer. He's been tolerating the dressing changes without complication. Fortunately both legs show signs of less drainage in his edema is very well controlled at this point as well. Overall very pleased with how things seem to be progressing. 03/31/18 on evaluation today patient actually appears to be doing excellent in regard to his bilateral lower extremity ulcers. These are not draining nearly as significant as what they were in the past overall seem to be shown signs of excellent improvement which is great news. Fortunately there is no sign of active infection at this time I do believe that the Levaquin has done extremely well for him in this regard. The patient continues to change these at home typically every day. We may be able to slowly work towards every other day changes since the drainage seems to be slowing down quite significantly. 04/14/18 on evaluation today patient appears to be doing well in regard to his bilateral lower extremities. Let me [facility] Almost completely healed which is excellent news. Fortunately he's shown signs of improvement all other sites as well with new skin growth there's some slight hyper granular tissue but for the most part this seems to be well maintained with the [facility] Dressing. I'm very happy in this regard. 04/28/18 on evaluation today patient appears to be doing rather well in regard to his ulcers of the bilateral lower extremities all things considering. He continues to make some progress as far as new skin growth. There still some hyper granulation  noted at this point despite the use of the [facility] Dressing. This is not terrible but I think we may want to consider conclude cauterization today with silver nitrate to try to help knock some of his back as well as helping with any biofilm on the surface of the wound. 05/12/18 on evaluation today patient's wounds actually appear to be doing fairly well in regard to the bilateral lower extremities. He's been tolerating the dressing changes without complication. Fortunately there's no signs of active infection at this time which is good news. Overall very pleased with how things seem to be progressing. You select silver nitrate was beneficial for him. 05/26/18 on evaluation today patient appears to be doing better in regard to left lower extremity and a little bit worse in regard to the right lower extremity. He states that he was pulling off the [facility] Dressing peel back some of the skin  making this area significantly larger than what it was previous. He's not had any issues other than this and states even that hasn't caused any pain he just seems to obviously have a much larger area on the right when compared to the previous time I saw him. No fevers, chills, nausea, or vomiting noted at this time. 06/16/18 on evaluation today patient actually appears to be doing a little better in my pinion in regard to his lower summary ulcers. He has new skin islands that seem to be spreading which is good news. Fortunately there's no signs of active infection at this time. His biggest issue is he tells me that coming as often as he does is becoming very cost prohibitive. He wonders if we can potentially spread this out. 07/14/18 on evaluation today patient appears to be doing a little bit worse in regard to his lower from the ulcer. Unfortunately he still continues to have a significant amount of drainage I think we need to do something to try to help this more. He is still somewhat reluctant to go  the route of the Wound VAC although that may be the most appropriate thing for him. No fevers, chills, nausea, or vomiting noted at this time. 08/18/2018 on evaluation today patient actually appears to be doing quite well with regard to his bilateral lower extremity ulcers. I do feel like that currently he is making great progress the care max does seem to be doing a great job at helping to control the moisture he has no maceration or skin breakdown. Again this seems to be an excellent way to go. 1 thing we may want to change is adding collagen to the base of the wound and then the care max over top he is not opposed to this. 09/15/2018 on evaluation today patient appears to be doing well with regard to his bilateral lower extremity ulcers. He is showing some signs of improvement not necessarily in size but definitely in appearance. In fact he has a lot of new skin growing throughout the wounds along the edges as well as in the central portion of the wounds on both lower extremities. Overall I am extremely pleased to see this. 10/20/2018 on evaluation today patient actually appears to be doing quite well with regard to his wounds. They are not measuring significantly smaller but he does have a lot of new epithelization noted as compared to previous. Fortunately there is no signs of active infection at this time. No fevers, chills, nausea, vomiting, or diarrhea. 11/17/2018 on evaluation today patient presents for reevaluation concerning his bilateral lower extremity ulcers. Fortunately there is no signs of active infection at this time today. He has been tolerating the dressing changes without complication. No fevers, chills, nausea, vomiting, or diarrhea. Unfortunately in general the patient has not made as much improvement as I would like to have seen up to this point. He has been tolerating the dressing changes without complication and he does an excellent job taking care of his wounds at home in my  opinion. The biggest issue I see is that he is just not making the progress that we need to be seeing currently. I think we may want to consider having him seen at a plastic surgery appointment and he has previously seen someone in years past at [facility] in [place]. That is definitely a possibility for [place] to look into at this point. 12/29/2018 on evaluation today patient appears to be doing better in regard to  the overall visual appearance of his wounds which do not appear to be as macerated. He does have a much larger skin island in the middle of the left lower extremity ulcer which is doing much better. He tells me the pain is also significantly better. With that being said overall his improvement as far as the size of the wounds is not better but again these are very irregular in change shape quite often. Fortunately there is no evidence of active infection at this time which is great news. He never heard anything from [facility] regarding the plastic surgery referral that we made to them. 01/26/2019 upon evaluation today patient appears to be doing a little better in regard to his wounds today. He has been tolerating the dressing changes again he performs these for the most part on his own. He does a great job wrapping his legs in my opinion. Unfortunately he has not been able to get down to [facility] to see if there is anything from a plastic surgery standpoint that could be done to help with his legs simply due to the fact that his wife unfortunately sustained a compression fracture in her spine she is seeing Dr. Saintclair Halsted and subsequently is going to be having what sounds to be a kyphoplasty type procedure. With that being said that has not been scheduled yet there is still waiting on an MRI the patient is very busy in fact overly busy trying to help take care of his wife at this point. I completely understand this is more of a strain on him at this  time Objective Constitutional Well-nourished and well-hydrated in no acute distress. Vitals Time Taken: 1:08 PM, Height: 71 in, Weight: 220 lbs, BMI: 30.7, Temperature: 98.2 F, Pulse: 71 bpm, Respiratory Rate: 20 breaths/min, Blood Pressure: 151/62 mmHg. Respiratory normal breathing without difficulty. Psychiatric this patient is able to make decisions and demonstrates good insight into disease process. Alert and Oriented x 3. pleasant and cooperative. General Notes: Patient's wound bed currently showed signs of good granulation for the most part he does have good epithelization as well. I think that the wounds are progressing although is very slow. Unfortunately I think that his lack of more significant progress comes back down to the epidermolysis bullosa which has plagued him his entire life. I think that is making this somewhat more difficult even than average to heal. Integumentary (Hair, Skin) Wound #2 status is Open. Original cause of wound was Gradually Appeared. The wound is located on the Left,Distal,Lateral Lower Leg. The wound measures 14cm length x 14cm width x 0.1cm depth; 153.938cm^2 area and 15.394cm^3 volume. There is Fat Layer (Subcutaneous Tissue) Exposed exposed. There is no tunneling or undermining noted. There is a large amount of serosanguineous drainage noted. The wound margin is flat and intact. There is large (67-100%) red, hyper - granulation within the wound bed. There is a small (1-33%) amount of necrotic tissue within the wound bed including Adherent Slough. Wound #5 status is Open. Original cause of wound was Gradually Appeared. The wound is located on the Right,Proximal,Lateral Lower Leg. The wound measures 11.4cm length x 13.5cm width x 0.1cm depth; 120.873cm^2 area and 12.087cm^3 volume. There is Fat Layer (Subcutaneous Tissue) Exposed exposed. There is no tunneling or undermining noted. There is a large amount of serosanguineous drainage noted. The wound  margin is flat and intact. There is large (67-100%) red granulation within the wound bed. There is a small (1-33%) amount of necrotic tissue within the wound bed including Adherent  Slough. 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 Dressing Change Frequency: Wound #2 Left,Distal,Lateral Lower Leg: Change Dressing every other day. Wound #5 Right,Proximal,Lateral Lower Leg: Change Dressing every other day. Skin Barriers/Peri-Wound Care: Barrier cream - to periwound areas Moisturizing lotion - both legs with dressing changes Wound Cleansing: Wound #2 Left,Distal,Lateral Lower Leg: May shower and wash wound with soap and water. - with dressing changes Wound #5 Right,Proximal,Lateral Lower Leg: May shower and wash wound with soap and water. - with dressing changes Primary Wound Dressing: Wound #2 Left,Distal,Lateral Lower Leg: Collagen - fibracol - do not need to moisten Wound #5 Right,Proximal,Lateral Lower Leg: Collagen - fibracol - do not need to moisten Secondary Dressing: Wound #2 Left,Distal,Lateral Lower Leg: Kerlix/Rolled Gauze ABD pad - all wounds Kerramax - or xtrasorb Wound #5 Right,Proximal,Lateral Lower Leg: Kerlix/Rolled Gauze ABD pad - all wounds Kerramax Edema Control: Avoid standing for long periods of time Elevate legs to the level of the heart or above for 30 minutes daily and/or when sitting, a frequency of: - throughout the day Support Garment 20-30 mm/Hg pressure to: - patient to apply juxtalites daily to both legs over ace wraps Other: - ACE wrap both legs 1. I would  recommend currently that we continue with the current wound care measures which includes the febrile call followed by securing with ABD pads, XtraSorb, and then Curlex. The patient is using his Velcro compression garments over top of this to help with compression. 2. With regard to the referral to [facility] plastic surgery unfortunately he is going to have to hold off on this which I completely understand due to helping take care of his wife she has a compression fracture currently. We will subsequently revisit that at some point in the future but right now that she does not go to be a priority for him which I can understand. We will see patient back for reevaluation in 1 week here in the clinic. If anything worsens or changes patient will contact our office for additional recommendations. Electronic Signature(s) Signed: 01/26/2019 1:54:27 PM By: Worthy Keeler PA-C Entered By: Worthy Keeler on 01/26/2019 13:54:26 -------------------------------------------------------------------------------- SuperBill Details Patient Name: Date of Service: Carlos White 01/26/2019 Medical Record [id number] Patient Account Number: [id number] Date of Birth/Sex: Treating RN: 07-Apr-1940 (79 y.o. 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.812 Non-pressure chronic ulcer of other part of right lower leg with fat layer exposed [place] (primary) hypertension Facility Procedures CPT4 Code: [id number] Description: 99214 - WOUND CARE VISIT-LEV 4 EST PT Modifier: Quantity:  1 Physician Procedures CPT4 Code Description: [id number] - WC PHYS LEVEL 3 - EST PT ICD-10 Diagnosis Description I87.2 Venous insufficiency (chronic) (peripheral) 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

## 2019-01-27 DIAGNOSIS — I87312 Chronic venous hypertension (idiopathic) with ulcer of left lower extremity: Secondary | ICD-10-CM | POA: Diagnosis not present

## 2019-01-27 NOTE — Progress Notes (Signed)
DEVANSH, RIESE ([id number]) Visit Report for 01/26/2019 Arrival Information Details Patient Name: Date of Service: JILL, RUPPE 01/26/2019 12:30 PM Medical Record [id number] Patient Account Number: [id number] Date of Birth/Sex: Treating RN: Jan 25, 1940 (79 y.o. Janyth Contes Primary Care Peggy Monk: [name] Other Clinician: Referring Shizue Kaseman: Treating Phila Shoaf/Extender:Stone III, Sharen Hint, MARY Weeks in Treatment: 10 Visit Information History Since Last Visit Added or deleted any medications: No Patient Arrived: Ambulatory Any new allergies or adverse reactions: No Arrival Time: 13:04 Had a fall or experienced change in No Accompanied By: alone activities of daily living that may affect Transfer Assistance: None risk of falls: Patient Identification Verified: Yes Signs or symptoms of abuse/neglect since last No Secondary Verification Process Yes visito Completed: Hospitalized since last visit: No Patient Requires Transmission-Based No Implantable device outside of the clinic excluding No Precautions: cellular tissue based products placed in the center Patient Has Alerts: Yes since last visit: Patient Alerts: R ABI= 1.23, TBI Has Dressing in Place as Prescribed: Yes = .86 Pain Present Now: No L ABI= 1.28, TBI=.81 Electronic Signature(s) Signed: 01/27/2019 6:31:26 PM By: Levan Hurst RN, BSN Entered By: Levan Hurst on 01/26/2019 13:05:36 -------------------------------------------------------------------------------- Clinic Level of Care Assessment Details Patient Name: Date of Service: ARNOLD, KESTER 01/26/2019 12:30 PM Medical Record [id number] Patient Account Number: [id number] Date of Birth/Sex: Treating RN: August 14, 1940 (79 y.o. [name] Primary Care Yamil Oelke: [name] Other Clinician: Referring Exzavier Ruderman: Treating Taliyah Watrous/Extender:Stone III, Sharen Hint, MARY Weeks in Treatment: 66 Clinic Level of Care Assessment  Items TOOL 4 Quantity Score []  - Use when only an EandM is performed on FOLLOW-UP visit 0 ASSESSMENTS - Nursing Assessment / Reassessment X - Reassessment of Co-morbidities (includes updates in patient status) 1 10 X - Reassessment of Adherence to Treatment Plan 1 5 ASSESSMENTS - Wound and Skin Assessment / Reassessment []  - Simple Wound Assessment / Reassessment - one wound 0 X - Complex Wound Assessment / Reassessment - multiple wounds 2 5 []  - Dermatologic / Skin Assessment (not related to wound area) 0 ASSESSMENTS - Focused Assessment X - Circumferential Edema Measurements - multi extremities 2 5 []  - Nutritional Assessment / Counseling / Intervention 0 X - Lower Extremity Assessment (monofilament, tuning fork, pulses) 1 5 []  - Peripheral Arterial Disease Assessment (using hand held doppler) 0 ASSESSMENTS - Ostomy and/or Continence Assessment and Care []  - Incontinence Assessment and Management 0 []  - Ostomy Care Assessment and Management (repouching, etc.) 0 PROCESS - Coordination of Care X - Simple Patient / Family Education for ongoing care 1 15 []  - Complex (extensive) Patient / Family Education for ongoing care 0 X - Staff obtains Programmer, systems, Records, Test Results / Process Orders 1 10 []  - Staff telephones HHA, Nursing Homes / Clarify orders / etc 0 []  - Routine Transfer to another Facility (non-emergent condition) 0 []  - Routine Hospital Admission (non-emergent condition) 0 []  - New Admissions / Biomedical engineer / Ordering NPWT, Apligraf, etc. 0 []  - Emergency Hospital Admission (emergent condition) 0 X - Simple Discharge Coordination 1 10 []  - Complex (extensive) Discharge Coordination 0 PROCESS - Special Needs []  - Pediatric / Minor Patient Management 0 []  - Isolation Patient Management 0 []  - Hearing / Language / Visual special needs 0 []  - Assessment of Community assistance (transportation, D/C planning, etc.) 0 []  - Additional assistance / Altered mentation  0 []  - Support Surface(s) Assessment (bed, cushion, seat, etc.) 0 INTERVENTIONS - Wound Cleansing / Measurement []  - Simple Wound Cleansing - one wound  0 X - Complex Wound Cleansing - multiple wounds 2 5 X - Wound Imaging (photographs - any number of wounds) 1 5 []  - Wound Tracing (instead of photographs) 0 []  - Simple Wound Measurement - one wound 0 X - Complex Wound Measurement - multiple wounds 2 5 INTERVENTIONS - Wound Dressings []  - Small Wound Dressing one or multiple wounds 0 X - Medium Wound Dressing one or multiple wounds 2 15 []  - Large Wound Dressing one or multiple wounds 0 X - Application of Medications - topical 1 5 []  - Application of Medications - injection 0 INTERVENTIONS - Miscellaneous []  - External ear exam 0 []  - Specimen Collection (cultures, biopsies, blood, body fluids, etc.) 0 []  - Specimen(s) / Culture(s) sent or taken to Lab for analysis 0 []  - Patient Transfer (multiple staff / Civil Service fast streamer / Similar devices) 0 []  - Simple Staple / Suture removal (25 or less) 0 []  - Complex Staple / Suture removal (26 or more) 0 []  - Hypo / Hyperglycemic Management (close monitor of Blood Glucose) 0 []  - Ankle / Brachial Index (ABI) - do not check if billed separately 0 X - Vital Signs 1 5 Has the patient been seen at the hospital within the last three years: Yes Total Score: 140 Level Of Care: New/Established - Level 4 Electronic Signature(s) Signed: 01/26/2019 6:34:17 PM By: Baruch Gouty RN, BSN Entered By: Baruch Gouty on 01/26/2019 13:38:29 -------------------------------------------------------------------------------- Encounter Discharge Information Details Patient Name: Date of Service: [name] 01/26/2019 12:30 PM Medical Record [id number] Patient Account Number: [id number] Date of Birth/Sex: Treating RN: [birth year] (79 y.o. Oval Linsey Primary Care Catrena Vari: [name] Other Clinician: Referring Sylvia Helms: Treating  Inocente Krach/Extender:Stone III, Sharen Hint, MARY Weeks in Treatment: 85 Encounter Discharge Information Items Discharge Condition: Stable Ambulatory Status: Ambulatory Discharge Destination: Home Transportation: Private Auto Accompanied By: self Schedule Follow-up Appointment: Yes Clinical Summary of Care: Patient Declined Electronic Signature(s) Signed: 01/26/2019 6:22:51 PM By: Carlene Coria RN Entered By: Carlene Coria on 01/26/2019 14:09:01 -------------------------------------------------------------------------------- Lower Extremity Assessment Details Patient Name: Date of Service: JAMARR, TREINEN 01/26/2019 12:30 PM Medical Record [id number] Patient Account Number: [id number] Date of Birth/Sex: Treating RN: [birth year] (79 y.o. Janyth Contes Primary Care Gael Delude: [name] Other Clinician: Referring Emalene Welte: Treating Duval Macleod/Extender:Stone III, Sharen Hint, MARY Weeks in Treatment: 84 Edema Assessment Assessed: [Left: No] [Right: No] Edema: [Left: Yes] [Right: Yes] Calf Left: Right: Point of Measurement: 33 cm From Medial Instep 36 cm 38 cm Ankle Left: Right: Point of Measurement: 12 cm From Medial Instep 25.5 cm 26 cm Vascular Assessment Pulses: Dorsalis Pedis Palpable: [Left:Yes] [Right:Yes] Electronic Signature(s) Signed: 01/27/2019 6:31:26 PM By: Levan Hurst RN, BSN Entered By: Levan Hurst on 01/26/2019 13:14:31 -------------------------------------------------------------------------------- [place] Details Patient Name: Date of Service: [name] 01/26/2019 12:30 PM Medical Record [id number] Patient Account Number: [id number] Date of Birth/Sex: Treating RN: 01-09-1941 (79 y.o. [name] Primary Care Emanie Behan: [name] Other Clinician: Referring Audris Speaker: Treating Briana Newman/Extender:Stone III, Sharen Hint, MARY Weeks in Treatment: 67 Active Inactive Venous Leg Ulcer Nursing Diagnoses: Knowledge  deficit related to disease process and management Potential for venous Insuffiency (use before diagnosis confirmed) Goals: Patient will maintain optimal edema control Date Initiated: 09/30/2017 Target Resolution Date: 02/23/2019 Goal Status: Active Patient/caregiver will verbalize understanding of disease process and disease management Date Inactivated: 11/04/2017 Target Resolution Date Initiated: 09/30/2017 Date: 10/30/2017 Goal Status: Met Interventions: Assess peripheral edema status every visit. Provide education on venous insufficiency Notes: Wound/Skin Impairment Nursing Diagnoses:  Impaired tissue integrity Knowledge deficit related to ulceration/compromised skin integrity Goals: Patient/caregiver will verbalize understanding of skin care regimen Date Initiated: 09/30/2017 Target Resolution Date: 02/23/2019 Goal Status: Active Ulcer/skin breakdown will have a volume reduction of 30% by week 4 Date Inactivated: 11/04/2017 Target10/18/2019 Resolution Date Initiated: 09/30/2017 Date: Goal Status: Met Interventions: Assess patient/caregiver ability to obtain necessary supplies Assess patient/caregiver ability to perform ulcer/skin care regimen upon admission and as needed Assess ulceration(s) every visit Provide education on ulcer and skin care Notes: Electronic Signature(s) Signed: 01/26/2019 6:34:17 PM By: Baruch Gouty RN, BSN Entered By: Baruch Gouty on 01/26/2019 13:35:20 -------------------------------------------------------------------------------- Pain Assessment Details Patient Name: Date of Service: [name] 01/26/2019 12:30 PM Medical Record [id number] Patient Account Number: [id number] Date of Birth/Sex: Treating RN: Jan 04, 1941 (79 y.o. Janyth Contes Primary Care Tyianna Menefee: [name] Other Clinician: Referring Kassondra Geil: Treating Julena Barbour/Extender:Stone III, Sharen Hint, MARY Weeks in Treatment: 20 Active Problems Location of Pain  Severity and Description of Pain Patient Has Paino No Site Locations Pain Management and Medication Current Pain Management: Electronic Signature(s) Signed: 01/27/2019 6:31:26 PM By: Levan Hurst RN, BSN Entered By: Levan Hurst on 01/26/2019 13:05:45 -------------------------------------------------------------------------------- Patient/Caregiver Education Details Patient Name: Date of Service: [name] 1/13/2021andnbsp12:30 PM Medical Record [id number] Patient Account Number: [id number] Date of Birth/Gender: Treating RN: 01-04-1941 (78 y.o. [name] Primary Care Physician: [name] Other Clinician: Referring Physician: Treating Physician/Extender:Stone III, Sharen Hint, MARY Weeks in Treatment: 70 Education Assessment Education Provided To: Patient Education Topics Provided Venous: Methods: Explain/Verbal Responses: Reinforcements needed, State content correctly Wound/Skin Impairment: Methods: Explain/Verbal Responses: Reinforcements needed, State content correctly Electronic Signature(s) Signed: 01/26/2019 6:34:17 PM By: Baruch Gouty RN, BSN Entered By: Baruch Gouty on 01/26/2019 13:35:37 -------------------------------------------------------------------------------- Wound Assessment Details Patient Name: Date of Service: [name] 01/26/2019 12:30 PM Medical Record [id number] Patient Account Number: [id number] Date of Birth/Sex: Treating RN: 08-14-1940 (79 y.o. Janyth Contes Primary Care Sanjit Mcmichael: [name] Other Clinician: Referring Chaunta Bejarano: Treating Konnar Ben/Extender:Stone III, Sharen Hint, MARY Weeks in Treatment: 73 Wound Status Wound Number: 2 Primary Venous Leg Ulcer Etiology: Wound Location: Left Lower Leg - Lateral, Distal Wound Open Wounding Event: Gradually Appeared Status: Date Acquired: 03/13/2017 Comorbid Cataracts, Chronic Obstructive Pulmonary Weeks Of Treatment: 69 History: Disease (COPD),  Hypertension Clustered Wound: No Photos Wound Measurements Length: (cm) 14 % Reduct Width: (cm) 14 % Reduct Depth: (cm) 0.1 Epitheli Area: (cm) 153.938 Tunneli Volume: (cm) 15.394 Undermi Wound Description Classification: Full Thickness Without Exposed Support Foul Odo Structures Slough/F Wound Flat and Intact Margin: Exudate Large Amount: Exudate Serosanguineous Type: Exudate red, brown Color: Wound Bed Granulation Amount: Large (67-100%) Granulation Quality: Red, Hyper-granulation Fascia E Necrotic Amount: Small (1-33%) Fat Laye Necrotic Quality: Adherent Slough Tendon E Muscle E Joint Ex Bone Exp r After Cleansing: No ibrino Yes Exposed Structure xposed: No r (Subcutaneous Tissue) Exposed: Yes xposed: No xposed: No posed: No osed: No ion in Area: -98.6% ion in Volume: 60.3% alization: Small (1-33%) ng: No ning: No Treatment Notes Wound #2 (Left, Distal, Lateral Lower Leg) 1. Cleanse With Wound Cleanser Soap and water 3. Primary Dressing Applied Collagen 4. Secondary Dressing ABD Pad Roll Gauze Kerramax/Xtrasorb Electronic Signature(s) Signed: 01/27/2019 3:33:56 PM By: Mikeal Hawthorne EMT/HBOT Signed: 01/27/2019 6:31:26 PM By: Levan Hurst RN, BSN Entered By: Mikeal Hawthorne on 01/27/2019 11:11:38 -------------------------------------------------------------------------------- Wound Assessment Details Patient Name: Date of Service: DAM, ASHRAF 01/26/2019 12:30 PM Medical Record [id number] Patient Account Number: [id number] Date of Birth/Sex: Treating RN: August 11, 1940 (79 y.o. Jonette Eva,  [place] Primary Care Eden Rho: [name] Other Clinician: Referring Gayna Braddy: Treating Tameshia Bonneville/Extender:Stone III, Sharen Hint, MARY Weeks in Treatment: 55 Wound Status Wound Number: 5 Primary Vasculopathy Etiology: Wound Location: Right Lower Leg - Lateral, Proximal Wound Open Status: Wounding Event: Gradually Appeared Comorbid Cataracts,  Chronic Obstructive Pulmonary Date Acquired: 10/26/2017 History: Disease (COPD), Hypertension Weeks Of Treatment: 65 Clustered Wound: No Photos Wound Measurements Length: (cm) 11.4 % Reduc Width: (cm) 13.5 % Reduc Depth: (cm) 0.1 Epithel Area: (cm) 120.873 Tunnel Volume: (cm) 12.087 Underm Wound Description Full Thickness Without Exposed Support Foul O Classification: Structures Slough Wound Flat and Intact Margin: Exudate Large Amount: Exudate Serosanguineous Type: Exudate red, brown Color: Wound Bed Granulation Amount: Large (67-100%) Granulation Quality: Red Fascia Necrotic Amount: Small (1-33%) Fat Lay Necrotic Quality: Adherent Slough Tendon Muscle Joint E Bone Exposed dor After Cleansing: No /Fibrino Yes Exposed Structure Exposed: No er (Subcutaneous Tissue) Exposed: Yes Exposed: No Exposed: No xposed: No : No tion in Area: -122.7% tion in Volume: -122.6% ialization: Small (1-33%) ing: No ining: No Treatment Notes Wound #5 (Right, Proximal, Lateral Lower Leg) 1. Cleanse With Wound Cleanser Soap and water 3. Primary Dressing Applied Collagen 4. Secondary Dressing ABD Pad Roll Gauze Kerramax/Xtrasorb Electronic Signature(s) Signed: 01/27/2019 3:33:56 PM By: Mikeal Hawthorne EMT/HBOT Signed: 01/27/2019 6:31:26 PM By: Levan Hurst RN, BSN Entered By: Mikeal Hawthorne on 01/27/2019 11:11:15 -------------------------------------------------------------------------------- [place] Details Patient Name: Date of Service: [name] 01/26/2019 12:30 PM Medical Record [id number] Patient Account Number: [id number] Date of Birth/Sex: Treating RN: 20-Apr-1940 (79 y.o. Janyth Contes Primary Care Deshawn Skelley: [name] Other Clinician: Referring Etoile Looman: Treating Jada Kuhnert/Extender:Stone III, Sharen Hint, MARY Weeks in Treatment: 50 Vital Signs Time Taken: 13:08 Temperature (F): 98.2 Height (in): 71 Pulse (bpm): 71 Weight (lbs):  220 Respiratory Rate (breaths/min): 20 Body Mass Index (BMI): 30.7 Blood Pressure (mmHg): 151/62 Reference Range: 80 - 120 mg / dl Electronic Signature(s) Signed: 01/27/2019 6:31:26 PM By: Levan Hurst RN, BSN Entered By: Levan Hurst on 01/26/2019 13:08:43

## 2019-02-17 ENCOUNTER — Other Ambulatory Visit: Payer: Self-pay | Admitting: Internal Medicine

## 2019-02-23 ENCOUNTER — Telehealth: Payer: Self-pay | Admitting: Internal Medicine

## 2019-02-23 ENCOUNTER — Other Ambulatory Visit: Payer: Self-pay

## 2019-02-23 ENCOUNTER — Encounter (HOSPITAL_BASED_OUTPATIENT_CLINIC_OR_DEPARTMENT_OTHER): Payer: Medicare PPO | Attending: Physician Assistant | Admitting: Physician Assistant

## 2019-02-23 DIAGNOSIS — M319 Necrotizing vasculopathy, unspecified: Secondary | ICD-10-CM | POA: Diagnosis not present

## 2019-02-23 DIAGNOSIS — J449 Chronic obstructive pulmonary disease, unspecified: Secondary | ICD-10-CM | POA: Diagnosis not present

## 2019-02-23 DIAGNOSIS — I872 Venous insufficiency (chronic) (peripheral): Secondary | ICD-10-CM | POA: Insufficient documentation

## 2019-02-23 DIAGNOSIS — Q819 Epidermolysis bullosa, unspecified: Secondary | ICD-10-CM | POA: Diagnosis not present

## 2019-02-23 DIAGNOSIS — I10 Essential (primary) hypertension: Secondary | ICD-10-CM | POA: Diagnosis not present

## 2019-02-23 DIAGNOSIS — L97812 Non-pressure chronic ulcer of other part of right lower leg with fat layer exposed: Secondary | ICD-10-CM | POA: Insufficient documentation

## 2019-02-23 DIAGNOSIS — L97322 Non-pressure chronic ulcer of left ankle with fat layer exposed: Secondary | ICD-10-CM | POA: Insufficient documentation

## 2019-02-23 DIAGNOSIS — L97222 Non-pressure chronic ulcer of left calf with fat layer exposed: Secondary | ICD-10-CM | POA: Diagnosis not present

## 2019-02-23 DIAGNOSIS — L97822 Non-pressure chronic ulcer of other part of left lower leg with fat layer exposed: Secondary | ICD-10-CM | POA: Insufficient documentation

## 2019-02-23 NOTE — Telephone Encounter (Signed)
Left message with patients wife for him to call me when he has time to schedule CPE and Labs, we are now booking out into April

## 2019-02-23 NOTE — Progress Notes (Addendum)
SABIEN, MAKAREWICZ ([id number]) Visit Report for 02/23/2019 Chief Complaint Document Details Patient Name: Date of Service: Carlos White, Carlos White 02/23/2019 12:30 PM Medical Record [id number] Patient Account Number: [id number] Date of Birth/Sex: Treating RN: April 14, 1940 (79 y.o. [name] Primary Care Provider: [name] Other Clinician: Referring Provider: Treating Provider/Extender:Stone III, Sharen Hint, MARY Weeks in Treatment: 72 Information Obtained from: Patient Chief Complaint Bilateral LE Ulcers Electronic Signature(s) Signed: 02/23/2019 1:16:33 PM By: Worthy Keeler PA-C Entered By: Worthy Keeler on 02/23/2019 13:16:32 -------------------------------------------------------------------------------- HPI Details Patient Name: Date of Service: Carlos White, Carlos White 02/23/2019 12:30 PM Medical Record [id number] Patient Account Number: [id number] Date of Birth/Sex: Treating RN: 1940/05/12 (79 y.o. [name] Primary Care Provider: [name] Other Clinician: Referring Provider: Treating Provider/Extender:Stone III, Sharen Hint, MARY Weeks in Treatment: 67 History of Present Illness HPI Description: 09/30/17 on evaluation today patient presents for initial evaluation and our clinic concerning issues that he has been having with his bilateral lower extremities. He states this has been going on for quite some time at least six months. Currently his regiment has been mainly cleaning the area with peroxide, applying the is foreign ointment, and wrapping the area with ABD pads and then an ace wrap loosely. He has dealt with issues of this nature he tells me for quite some time. He does have a history of having had a compound fracture of the left lower extremity which he thinks also makes this a much more difficult area for him to heal. He's previously been told that he had poor vascular flow but this was years ago at [facility] we do not have any of those records at  this time. He has a history of Epidermolysis Bullosa which was diagnosed around age 7 and he has been cared for at [facility] since that time. Subsequently he states this is hereditary and two of his children one male and one male also have this as well is one of his grandchildren that he is aware of. He has no evidence of infection necessarily at this point although he does have some necrotic tissue noted on the surface of the wound as far as the largest, left lateral lower extremity ulcer, is concerned. Overall I feel like all things considered he's been taking care of this very well. Obviously he has some fairly significant issues going on at this point in this regard. He does have a history otherwise of hypertension though for the most part other than the compound fracture of the left leg he seems to have been fairly healthy in my pinion. 10/07/17 on evaluation today patient actually appears to be doing better in regard to his bilateral lower extremity ulcers. With that being said he does still have some evidence of slough noted on the surface of the wounds I think the Iodoflex has been beneficial for him. His arterial studies are scheduled for October 2. With that being said I do believe that he is continuing to show signs of good improvement which is at least good news. 10/14/17 on evaluation today patient appears to be doing very well in regard to his lower extremity ulcers. He's definitely made some progress as far as healing is concerned although there still are several open areas that are going to need to be addressed. He did have his arterial study today which fortunately shows good findings with a right ABI of 1.23 with a TBI of 0.86 in the left ABI of 1.28 with a TBI of 0.81. This is good  news and will allow [place] to perform debridement as well. 10/23/2017; patient with a large wound on the left lateral calf, sizable area on the left medial malleolus and an area on the right lateral malleolus.  He has a new blister consistent with his underlying blistering skin disease just above this area we have been using Iodoflex on the lateral left calf lateral right ankle and collagen on the medial left ankle. We have been using Kerlix Coban wraps 10/28/17 on evaluation today the patient continues to have signs of improvement in regard to the overall appearance of the original wound. Unfortunately he did have some blistering over the right lateral lower extremity which has appeared to rupture on evaluation today and likely some of the dead tissue on the surface needs to be cleaned away the good news is this does not appear to be to significantly deep at this time. 11/04/17 on evaluation today patient actually appears to be doing a little better in regard to his lower extremity ulcers. He has been tolerating the dressing changes without complication. With that being said he does still have a significant wound especially over the left lateral lower extremity unfortunately. All of the wounds pretty much are going to require sharp debridement today. 11/11/17 on evaluation today patient appears to be doing more poorly in regard to his left lower extremity in particular. There does not appear to be any evidence of systemic infection although the wound itself as far as the larger left lateral lower extremity ulcer actually appears to be infected in my pinion. There's an older and the surface of the wound is dramatically worsened compared to last week. No fevers, chills, nausea, or vomiting noted at this time. 11/18/17 upon evaluation today patient actually appears to be doing better. I did review his culture today which really did not show any specific organism is a positive reason for his wound decline. There are multiple organisms present not predominant. Nonetheless he seems to be tolerate the doxycycline well in his wounds in general do seem to be doing better. Fortunately there does not appear to be  any evidence of infection at this time which is good news. Overall I'm very pleased with how things appear. Nonetheless he still has a lot of healing to go. I do think he could benefit from a Juxta-Lite wrap. 11/25/17 on evaluation today patient actually appears to be doing fairly well in regard to his wounds. He is still taking the antibiotics he has a few days left. Fortunately this seems to have been excellent for him as far as getting the infection control and very happy in this regard. With that being said the patient likewise is also very pleased with how things appear at this time in comparison to where we were he's not having as much pain. 12/02/17 Seen today for follow p and management of LLE wounds. Wounds appear to show some improvement. He denies pain, fever, or chills. Completed a course of doxycycline earlier this month. Scheduled to received Juxta-Lite wrap this week. No s/s of infections. 12/09/17 on evaluation today patient actually appears to be doing a little bit better in regard to his wounds. This is obscene very slow process and unfortunately he has a couple of new areas and this is due to the Epidermolysis Bullosa. Nonetheless I am concerned about the fact that he seems to be getting more areas not less that is the reason we're gonna work on getting schedule for the vascular referral to see the venous  specialist. 12/23/17 upon evaluation today patient's wounds currently shows evidence of still not doing quite as well is what I would like to have seen. Subsequently the patient did have his venous study which showed evidence of venous stasis. Subsequently I do think that a vascular evaluation for consideration of venous intervention would be appropriate. I'm not necessarily suggesting that will be anything that can be done but I think it is at least a good idea. He is in agreement with this plan. 12/30/17 on evaluation today patient actually appears to be doing very well in  regard to his wounds when compared to previous evaluation. Subsequently we have been using the [facility] Dressing which actually appears to have done excellent on his left lateral lower extremity ulcer. The quality of the wound surface is dramatically improved. There is some slight debridement that is going to be required at a couple of locations but overall I'm extremely pleased with how things appear here. 01/07/2018; this is a patient with a primary skin disorder epidermolyis bullosa. Is a large wound on the left lateral calf and smaller wounds on the right however there is a new wound on the right mid tibia area that occurred within the compression wrap that he did not change. We have been using Hydrofera Blue. On the left he is using Hydrofera Blue and Santyl to the inferior part of the wound and changing the dressing himself. 01/15/2018; primary skin disorder epidermolysis bullosa. He has several difficult wounds including the left lateral calf, smaller wounds on the left medial calf and the right lateral calf. The major area on the left lateral calf has a smaller area inferiorly that has necrotic debris we have been using Santyl to this. The rest of the wounds we have been using Hydrofera Blue. The area on the left calf actually looks larger this week. Uncontrolled edema several small open areas above it that are superficial 01/20/18 on evaluation today patient appears to be doing better as compared to last week in regard to his wounds of the bilateral lower extremities. He tolerated the bilateral compression wrap without complication. Overall I'm very pleased with how things appear at this time. The patient likewise is very happy. 01/27/18 on evaluation today patient appears to be doing decently well in regard to his bilateral lower extremity ulcers. He's been tolerating the dressing changes without complication. One issue he had is that he did have more drainage to the left leg wrapped  last week. He states in fact he probably should come in and let us change it on Friday however he just left it in place and kept adding extra absorption with ABD pads to the external portion of the wrap. Unfortunately he does have some aspiration type breakdown nothing significant but I do believe that this was probably counterproductive in general. Nonetheless his wounds do not appear to be terrible overall. 02/03/18 on evaluation today patient appears to be doing rather well in regard to his lower extremity ulcers. He has been tolerating the dressing changes without complication. He does tell me that he had to change the wrap on the left one since we last saw him. Subsequently I do not see any evidence of infection I do feel like the food was much better controlled at this point. 02/10/18 on evaluation today patient appears to be doing rather well in regard to his ulcers. He still has significant alterations especially on the left lateral lower extremity. Fortunately there's no signs of infection at this time. Overall  I feel like he is making good progress are some areas that I'm gonna attempt some debridement today. 02/17/18 on evaluation today patient appears to be doing okay in regard to his lower Trinity ulcer. It does appear on both locations he has a little bit of drainage causing some breakdown in maceration around the wound bed's although it doesn't appear to be too bad the right is a little bit worse than left. Fortunately there's no signs of infection which is good news. No fevers, chills, nausea, or vomiting noted at this time. 03/10/18 on evaluation today patient actually appears to be doing rather poorly in regard to his bilateral lower extremity ulcers. The right in particular is draining profusely and the wound is actually enlarging which is not good. I'm concerned about both possibly infection and the fact that there's a lot of moisture which is causing breakdown as well. Unfortunately  the patient has been trying to change this at home I'm afraid he may need to change more frequently in order to see the improvement that were looking for. There's no signs of systemic infection. 03/17/18 patient actually appears to be doing significantly better at this point in regard to his bilateral lower extremity ulcers. Fortunately there's no signs of infection. That is worsening infection at least indefinitely nothing systemic. With that being said he is having a lot of drainage though not quite as much is there in his last evaluation. Overall feel like he's on the side of improvement. I think if his results back from his culture which showed that he had a positive group B strep along with abundant Pseudomonas noted on the culture. For that reason I am gonna have him continue with the linezolid as we previously have ordered for him and I did go ahead as well today and prescribe Levaquin as well in order to treat the Pseudomonas portion of the infection noted. 03/24/18 on evaluation today patient actually appears to be doing very well in regard to his lower Trinity ulcer. He's been tolerating the dressing changes without complication. Fortunately both legs show signs of less drainage in his edema is very well controlled at this point as well. Overall very pleased with how things seem to be progressing. 03/31/18 on evaluation today patient actually appears to be doing excellent in regard to his bilateral lower extremity ulcers. These are not draining nearly as significant as what they were in the past overall seem to be shown signs of excellent improvement which is great news. Fortunately there is no sign of active infection at this time I do believe that the Levaquin has done extremely well for him in this regard. The patient continues to change these at home typically every day. We may be able to slowly work towards every other day changes since the drainage seems to be slowing down quite  significantly. 04/14/18 on evaluation today patient appears to be doing well in regard to his bilateral lower extremities. Let me Hilda Blades Almost completely healed which is excellent news. Fortunately he's shown signs of improvement all other sites as well with new skin growth there's some slight hyper granular tissue but for the most part this seems to be well maintained with the [facility] Dressing. I'm very happy in this regard. 04/28/18 on evaluation today patient appears to be doing rather well in regard to his ulcers of the bilateral lower extremities all things considering. He continues to make some progress as far as new skin growth. There still some hyper granulation noted  at this point despite the use of the [facility] Dressing. This is not terrible but I think we may want to consider conclude cauterization today with silver nitrate to try to help knock some of his back as well as helping with any biofilm on the surface of the wound. 05/12/18 on evaluation today patient's wounds actually appear to be doing fairly well in regard to the bilateral lower extremities. He's been tolerating the dressing changes without complication. Fortunately there's no signs of active infection at this time which is good news. Overall very pleased with how things seem to be progressing. You select silver nitrate was beneficial for him. 05/26/18 on evaluation today patient appears to be doing better in regard to left lower extremity and a little bit worse in regard to the right lower extremity. He states that he was pulling off the [facility] Dressing peel back some of the skin making this area significantly larger than what it was previous. He's not had any issues other than this and states even that hasn't caused any pain he just seems to obviously have a much larger area on the right when compared to the previous time I saw him. No fevers, chills, nausea, or vomiting noted at this time. 06/16/18 on  evaluation today patient actually appears to be doing a little better in my pinion in regard to his lower summary ulcers. He has new skin islands that seem to be spreading which is good news. Fortunately there's no signs of active infection at this time. His biggest issue is he tells me that coming as often as he does is becoming very cost prohibitive. He wonders if we can potentially spread this out. 07/14/18 on evaluation today patient appears to be doing a little bit worse in regard to his lower from the ulcer. Unfortunately he still continues to have a significant amount of drainage I think we need to do something to try to help this more. He is still somewhat reluctant to go the route of the Wound VAC although that may be the most appropriate thing for him. No fevers, chills, nausea, or vomiting noted at this time. 08/18/2018 on evaluation today patient actually appears to be doing quite well with regard to his bilateral lower extremity ulcers. I do feel like that currently he is making great progress the care max does seem to be doing a great job at helping to control the moisture he has no maceration or skin breakdown. Again this seems to be an excellent way to go. 1 thing we may want to change is adding collagen to the base of the wound and then the care max over top he is not opposed to this. 09/15/2018 on evaluation today patient appears to be doing well with regard to his bilateral lower extremity ulcers. He is showing some signs of improvement not necessarily in size but definitely in appearance. In fact he has a lot of new skin growing throughout the wounds along the edges as well as in the central portion of the wounds on both lower extremities. Overall I am extremely pleased to see this. 10/20/2018 on evaluation today patient actually appears to be doing quite well with regard to his wounds. They are not measuring significantly smaller but he does have a lot of new epithelization noted as  compared to previous. Fortunately there is no signs of active infection at this time. No fevers, chills, nausea, vomiting, or diarrhea. 11/17/2018 on evaluation today patient presents for reevaluation concerning his bilateral  lower extremity ulcers. Fortunately there is no signs of active infection at this time today. He has been tolerating the dressing changes without complication. No fevers, chills, nausea, vomiting, or diarrhea. Unfortunately in general the patient has not made as much improvement as I would like to have seen up to this point. He has been tolerating the dressing changes without complication and he does an excellent job taking care of his wounds at home in my opinion. The biggest issue I see is that he is just not making the progress that we need to be seeing currently. I think we may want to consider having him seen at a plastic surgery appointment and he has previously seen someone in years past at [facility] in [place]. That is definitely a possibility for [place] to look into at this point. 12/29/2018 on evaluation today patient appears to be doing better in regard to the overall visual appearance of his wounds which do not appear to be as macerated. He does have a much larger skin island in the middle of the left lower extremity ulcer which is doing much better. He tells me the pain is also significantly better. With that being said overall his improvement as far as the size of the wounds is not better but again these are very irregular in change shape quite often. Fortunately there is no evidence of active infection at this time which is great news. He never heard anything from [facility] regarding the plastic surgery referral that we made to them. 01/26/2019 upon evaluation today patient appears to be doing a little better in regard to his wounds today. He has been tolerating the dressing changes again he performs these for the most part on his own. He does a  great job wrapping his legs in my opinion. Unfortunately he has not been able to get down to [facility] to see if there is anything from a plastic surgery standpoint that could be done to help with his legs simply due to the fact that his wife unfortunately sustained a compression fracture in her spine she is seeing Dr. Saintclair Halsted and subsequently is going to be having what sounds to be a kyphoplasty type procedure. With that being said that has not been scheduled yet there is still waiting on an MRI the patient is very busy in fact overly busy trying to help take care of his wife at this point. I completely understand this is more of a strain on him at this time 02/23/2019 upon evaluation today patient actually appears to be making some progress. I am actually very pleased with the overall appearance of his wounds even compared to last evaluation. He seems to be doing quite well. He is taking care of his wife unfortunately she did have a compression fracture she has had the procedure for this but still she has a slow road to recovery. For that reason he still not gone to [facility] for a second opinion in this regard. Obviously the goal there was if there was anything that can be done from a skin graft standpoint or otherwise. Electronic Signature(s) Signed: 02/23/2019 1:34:17 PM By: Worthy Keeler PA-C Entered By: Worthy Keeler on 02/23/2019 13:34:17 -------------------------------------------------------------------------------- Chemical Cauterization Details Patient Name: Date of Service: Carlos White, Carlos White 02/23/2019 12:30 PM Medical Record [id number] Patient Account Number: [id number] Date of Birth/Sex: Treating RN: 26-Feb-1940 (79 y.o. [name] Primary Care Provider: [name] Other Clinician: Referring Provider: Treating Provider/Extender:Stone III, Sharen Hint, MARY Weeks in Treatment:  64 Procedure Performed for: Wound #2 Left,Distal,Lateral Lower Leg Performed By: Physician  Worthy Keeler, PA Post Procedure Diagnosis Same as Pre-procedure Notes using silver nitrate sticks Electronic Signature(s) Signed: 02/23/2019 5:39:41 PM By: Baruch Gouty RN, BSN Signed: 02/23/2019 6:32:46 PM By: Worthy Keeler PA-C Entered By: Baruch Gouty on 02/23/2019 13:27:27 -------------------------------------------------------------------------------- Chemical Cauterization Details Patient Name: Date of Service: Carlos White, Carlos White 02/23/2019 12:30 PM Medical Record [id number] Patient Account Number: [id number] Date of Birth/Sex: Treating RN: Sep 01, 1940 (79 y.o. [name] Primary Care Provider: [name] Other Clinician: Referring Provider: Treating Provider/Extender:Stone III, Sharen Hint, MARY Weeks in Treatment: 23 Procedure Performed for: Wound #5 Right,Proximal,Lateral Lower Leg Performed By: Physician Worthy Keeler, PA Post Procedure Diagnosis Same as Pre-procedure Notes using silver nitrate sticks Electronic Signature(s) Signed: 02/23/2019 5:39:41 PM By: Baruch Gouty RN, BSN Signed: 02/23/2019 6:32:46 PM By: Worthy Keeler PA-C Entered By: Baruch Gouty on 02/23/2019 13:28:19 -------------------------------------------------------------------------------- Physical Exam Details Patient Name: Date of Service: Carlos White 02/23/2019 12:30 PM Medical Record [id number] Patient Account Number: [id number] Date of Birth/Sex: Treating RN: 12/21/1940 (79 y.o. [name] Primary Care Provider: [name] Other Clinician: Referring Provider: Treating Provider/Extender:Stone III, Sharen Hint, MARY Weeks in Treatment: 88 Constitutional Well-nourished and well-hydrated in no acute distress. Respiratory normal breathing without difficulty. Psychiatric this patient is able to make decisions and demonstrates good insight into disease process. Alert and Oriented x 3. pleasant and cooperative. Notes His wounds currently again  are showing signs of good granulation he has some hyper granulation I did have to perform chemical cauterization with silver nitrate today which he tolerated without complication post treatment he seemed to be doing well there was no significant bleeding. Electronic Signature(s) Signed: 02/23/2019 1:34:32 PM By: Worthy Keeler PA-C Entered By: Worthy Keeler on 02/23/2019 13:34:31 -------------------------------------------------------------------------------- Physician Orders Details Patient Name: Date of Service: Carlos White, Carlos White 02/23/2019 12:30 PM Medical Record [id number] Patient Account Number: [id number] Date of Birth/Sex: Treating RN: March 04, 1940 (78 y.o. 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 Dressing Change Frequency Wound #2 Left,Distal,Lateral Lower Leg Change Dressing every other day. Wound #5 Right,Proximal,Lateral Lower Leg Change Dressing every other day. Skin Barriers/Peri-Wound Care Barrier cream - to periwound areas Moisturizing lotion - both legs with dressing changes Wound Cleansing Wound #2 Left,Distal,Lateral Lower Leg May shower and wash wound with soap and water. - with dressing changes Wound #5 Right,Proximal,Lateral Lower Leg May shower and wash wound with soap and water. - with dressing changes Primary Wound  Dressing Wound #2 Left,Distal,Lateral Lower Leg Collagen - fibracol - do not need to moisten Wound #5 Right,Proximal,Lateral Lower Leg Collagen - fibracol - do not need to moisten Secondary Dressing Wound #2 Left,Distal,Lateral Lower Leg Kerlix/Rolled Gauze ABD pad - all wounds Zetuvit or Kerramax Wound #5 Right,Proximal,Lateral Lower Leg Kerlix/Rolled Gauze ABD pad - all wounds Zetuvit or Kerramax Edema Control Avoid standing for long periods of time Elevate legs to the level of the heart or above for 30  minutes daily and/or when sitting, a frequency of: - throughout the day Support Garment 20-30 mm/Hg pressure to: - patient to apply juxtalites daily to both legs over ace wraps Other: - ACE wrap both legs Electronic Signature(s) Signed: 02/23/2019 5:39:41 PM By: Baruch Gouty RN, BSN Signed: 02/23/2019 6:32:46 PM By: Worthy Keeler PA-C Entered By: Baruch Gouty on 02/23/2019 13:30:50 -------------------------------------------------------------------------------- Problem List Details Patient Name: Date of Service: Carlos White 02/23/2019 12:30 PM Medical Record [id number] Patient Account Number: [id number] Date of Birth/Sex: Treating RN: Nov 30, 1940 (79 y.o. 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 09/30/2017 No Yes L97.322 Non-pressure chronic ulcer of left ankle with fat layer 09/30/2017 No Yes exposed L97.822 Non-pressure chronic ulcer of other part of left lower 09/30/2017 No Yes leg with fat layer exposed L97.812 Non-pressure chronic ulcer of other part of right lower 09/30/2017 No Yes leg with fat layer exposed [place] (primary)  hypertension 09/30/2017 No Yes Inactive Problems Resolved Problems Electronic Signature(s) Signed: 02/23/2019 1:16:25 PM By: Worthy Keeler PA-C Entered By: Worthy Keeler on 02/23/2019 13:16:24 -------------------------------------------------------------------------------- Progress Note Details Patient Name: Date of Service: Carlos White 02/23/2019 12:30 PM Medical Record [id number] Patient Account Number: [id number] Date of Birth/Sex: Treating RN: [birth year] (79 y.o. [name] Primary Care Provider: [name] Other Clinician: Referring Provider: Treating Provider/Extender:Stone III, Sharen Hint, MARY Weeks in Treatment: 73 Subjective Chief Complaint Information obtained from Patient Bilateral LE Ulcers History of Present Illness (HPI) 09/30/17 on evaluation today patient presents for initial evaluation and our clinic concerning issues that he has been having with his bilateral lower extremities. He states this has been going on for quite some time at least six months. Currently his regiment has been mainly cleaning the area with peroxide, applying the is foreign ointment, and wrapping the area with ABD pads and then an ace wrap loosely. He has dealt with issues of this nature he tells me for quite some time. He does have a history of having had a compound fracture of the left lower extremity which he thinks also makes this a much more difficult area for him to heal. He's previously been told that he had poor vascular flow but this was years ago at [facility] we do not have any of those records at this time. He has a history of Epidermolysis Bullosa which was diagnosed around age 35 and he has been cared for at [facility] since that time. Subsequently he states this is hereditary and two of his children one male and one male also have this as well is one of his grandchildren that he is aware of. He has no evidence of infection necessarily at this point although he does  have some necrotic tissue noted on the surface of the wound as far as the largest, left lateral lower extremity ulcer, is concerned. Overall I feel like all things considered he's been taking care of this very well. Obviously he has some fairly significant issues going on at this point in this regard. He does have a history otherwise of hypertension though for the most part other than the compound fracture of the left leg he seems to have been fairly healthy in my pinion. 10/07/17 on evaluation today patient actually appears to be doing better in regard to his  bilateral lower extremity ulcers. With that being said he does still have some evidence of slough noted on the surface of the wounds I think the Iodoflex has been beneficial for him. His arterial studies are scheduled for October 2. With that being said I do believe that he is continuing to show signs of good improvement which is at least good news. 10/14/17 on evaluation today patient appears to be doing very well in regard to his lower extremity ulcers. He's definitely made some progress as far as healing is concerned although there still are several open areas that are going to need to be addressed. He did have his arterial study today which fortunately shows good findings with a right ABI of 1.23 with a TBI of 0.86 in the left ABI of 1.28 with a TBI of 0.81. This is good news and will allow [place] to perform debridement as well. 10/23/2017; patient with a large wound on the left lateral calf, sizable area on the left medial malleolus and an area on the right lateral malleolus. He has a new blister consistent with his underlying blistering skin disease just above this area we have been using Iodoflex on the lateral left calf lateral right ankle and collagen on the medial left ankle. We have been using Kerlix Coban wraps 10/28/17 on evaluation today the patient continues to have signs of improvement in regard to the overall appearance of the  original wound. Unfortunately he did have some blistering over the right lateral lower extremity which has appeared to rupture on evaluation today and likely some of the dead tissue on the surface needs to be cleaned away the good news is this does not appear to be to significantly deep at this time. 11/04/17 on evaluation today patient actually appears to be doing a little better in regard to his lower extremity ulcers. He has been tolerating the dressing changes without complication. With that being said he does still have a significant wound especially over the left lateral lower extremity unfortunately. All of the wounds pretty much are going to require sharp debridement today. 11/11/17 on evaluation today patient appears to be doing more poorly in regard to his left lower extremity in particular. There does not appear to be any evidence of systemic infection although the wound itself as far as the larger left lateral lower extremity ulcer actually appears to be infected in my pinion. There's an older and the surface of the wound is dramatically worsened compared to last week. No fevers, chills, nausea, or vomiting noted at this time. 11/18/17 upon evaluation today patient actually appears to be doing better. I did review his culture today which really did not show any specific organism is a positive reason for his wound decline. There are multiple organisms present not predominant. Nonetheless he seems to be tolerate the doxycycline well in his wounds in general do seem to be doing better. Fortunately there does not appear to be any evidence of infection at this time which is good news. Overall I'm very pleased with how things appear. Nonetheless he still has a lot of healing to go. I do think he could benefit from a Juxta-Lite wrap. 11/25/17 on evaluation today patient actually appears to be doing fairly well in regard to his wounds. He is still taking the antibiotics he has a few days left.  Fortunately this seems to have been excellent for him as far as getting the infection control and very happy in this regard. With that being said the  patient likewise is also very pleased with how things appear at this time in comparison to where we were he's not having as much pain. 12/02/17 Seen today for follow p and management of LLE wounds. Wounds appear to show some improvement. He denies pain, fever, or chills. Completed a course of doxycycline earlier this month. Scheduled to received Juxta-Lite wrap this week. No s/s of infections. 12/09/17 on evaluation today patient actually appears to be doing a little bit better in regard to his wounds. This is obscene very slow process and unfortunately he has a couple of new areas and this is due to the Epidermolysis Bullosa. Nonetheless I am concerned about the fact that he seems to be getting more areas not less that is the reason we're gonna work on getting schedule for the vascular referral to see the venous specialist. 12/23/17 upon evaluation today patient's wounds currently shows evidence of still not doing quite as well is what I would like to have seen. Subsequently the patient did have his venous study which showed evidence of venous stasis. Subsequently I do think that a vascular evaluation for consideration of venous intervention would be appropriate. I'm not necessarily suggesting that will be anything that can be done but I think it is at least a good idea. He is in agreement with this plan. 12/30/17 on evaluation today patient actually appears to be doing very well in regard to his wounds when compared to previous evaluation. Subsequently we have been using the [facility] Dressing which actually appears to have done excellent on his left lateral lower extremity ulcer. The quality of the wound surface is dramatically improved. There is some slight debridement that is going to be required at a couple of locations but overall  I'm extremely pleased with how things appear here. 01/07/2018; this is a patient with a primary skin disorder epidermolyis bullosa. Is a large wound on the left lateral calf and smaller wounds on the right however there is a new wound on the right mid tibia area that occurred within the compression wrap that he did not change. We have been using Hydrofera Blue. On the left he is using Hydrofera Blue and Santyl to the inferior part of the wound and changing the dressing himself. 01/15/2018; primary skin disorder epidermolysis bullosa. He has several difficult wounds including the left lateral calf, smaller wounds on the left medial calf and the right lateral calf. The major area on the left lateral calf has a smaller area inferiorly that has necrotic debris we have been using Santyl to this. The rest of the wounds we have been using Hydrofera Blue. The area on the left calf actually looks larger this week. Uncontrolled edema several small open areas above it that are superficial 01/20/18 on evaluation today patient appears to be doing better as compared to last week in regard to his wounds of the bilateral lower extremities. He tolerated the bilateral compression wrap without complication. Overall I'm very pleased with how things appear at this time. The patient likewise is very happy. 01/27/18 on evaluation today patient appears to be doing decently well in regard to his bilateral lower extremity ulcers. He's been tolerating the dressing changes without complication. One issue he had is that he did have more drainage to the left leg wrapped last week. He states in fact he probably should come in and let us change it on Friday however he just left it in place and kept adding extra absorption with ABD pads to the  external portion of the wrap. Unfortunately he does have some aspiration type breakdown nothing significant but I do believe that this was probably counterproductive in general. Nonetheless his  wounds do not appear to be terrible overall. 02/03/18 on evaluation today patient appears to be doing rather well in regard to his lower extremity ulcers. He has been tolerating the dressing changes without complication. He does tell me that he had to change the wrap on the left one since we last saw him. Subsequently I do not see any evidence of infection I do feel like the food was much better controlled at this point. 02/10/18 on evaluation today patient appears to be doing rather well in regard to his ulcers. He still has significant alterations especially on the left lateral lower extremity. Fortunately there's no signs of infection at this time. Overall I feel like he is making good progress are some areas that I'm gonna attempt some debridement today. 02/17/18 on evaluation today patient appears to be doing okay in regard to his lower Trinity ulcer. It does appear on both locations he has a little bit of drainage causing some breakdown in maceration around the wound bed's although it doesn't appear to be too bad the right is a little bit worse than left. Fortunately there's no signs of infection which is good news. No fevers, chills, nausea, or vomiting noted at this time. 03/10/18 on evaluation today patient actually appears to be doing rather poorly in regard to his bilateral lower extremity ulcers. The right in particular is draining profusely and the wound is actually enlarging which is not good. I'm concerned about both possibly infection and the fact that there's a lot of moisture which is causing breakdown as well. Unfortunately the patient has been trying to change this at home I'm afraid he may need to change more frequently in order to see the improvement that were looking for. There's no signs of systemic infection. 03/17/18 patient actually appears to be doing significantly better at this point in regard to his bilateral lower extremity ulcers. Fortunately there's no signs of infection.  That is worsening infection at least indefinitely nothing systemic. With that being said he is having a lot of drainage though not quite as much is there in his last evaluation. Overall feel like he's on the side of improvement. I think if his results back from his culture which showed that he had a positive group B strep along with abundant Pseudomonas noted on the culture. For that reason I am gonna have him continue with the linezolid as we previously have ordered for him and I did go ahead as well today and prescribe Levaquin as well in order to treat the Pseudomonas portion of the infection noted. 03/24/18 on evaluation today patient actually appears to be doing very well in regard to his lower Trinity ulcer. He's been tolerating the dressing changes without complication. Fortunately both legs show signs of less drainage in his edema is very well controlled at this point as well. Overall very pleased with how things seem to be progressing. 03/31/18 on evaluation today patient actually appears to be doing excellent in regard to his bilateral lower extremity ulcers. These are not draining nearly as significant as what they were in the past overall seem to be shown signs of excellent improvement which is great news. Fortunately there is no sign of active infection at this time I do believe that the Levaquin has done extremely well for him in this regard. The patient  continues to change these at home typically every day. We may be able to slowly work towards every other day changes since the drainage seems to be slowing down quite significantly. 04/14/18 on evaluation today patient appears to be doing well in regard to his bilateral lower extremities. Let me [facility] Almost completely healed which is excellent news. Fortunately he's shown signs of improvement all other sites as well with new skin growth there's some slight hyper granular tissue but for the most part this seems to be well maintained  with the [facility] Dressing. I'm very happy in this regard. 04/28/18 on evaluation today patient appears to be doing rather well in regard to his ulcers of the bilateral lower extremities all things considering. He continues to make some progress as far as new skin growth. There still some hyper granulation noted at this point despite the use of the [facility] Dressing. This is not terrible but I think we may want to consider conclude cauterization today with silver nitrate to try to help knock some of his back as well as helping with any biofilm on the surface of the wound. 05/12/18 on evaluation today patient's wounds actually appear to be doing fairly well in regard to the bilateral lower extremities. He's been tolerating the dressing changes without complication. Fortunately there's no signs of active infection at this time which is good news. Overall very pleased with how things seem to be progressing. You select silver nitrate was beneficial for him. 05/26/18 on evaluation today patient appears to be doing better in regard to left lower extremity and a little bit worse in regard to the right lower extremity. He states that he was pulling off the [facility] Dressing peel back some of the skin making this area significantly larger than what it was previous. He's not had any issues other than this and states even that hasn't caused any pain he just seems to obviously have a much larger area on the right when compared to the previous time I saw him. No fevers, chills, nausea, or vomiting noted at this time. 06/16/18 on evaluation today patient actually appears to be doing a little better in my pinion in regard to his lower summary ulcers. He has new skin islands that seem to be spreading which is good news. Fortunately there's no signs of active infection at this time. His biggest issue is he tells me that coming as often as he does is becoming very cost prohibitive. He wonders if we can  potentially spread this out. 07/14/18 on evaluation today patient appears to be doing a little bit worse in regard to his lower from the ulcer. Unfortunately he still continues to have a significant amount of drainage I think we need to do something to try to help this more. He is still somewhat reluctant to go the route of the Wound VAC although that may be the most appropriate thing for him. No fevers, chills, nausea, or vomiting noted at this time. 08/18/2018 on evaluation today patient actually appears to be doing quite well with regard to his bilateral lower extremity ulcers. I do feel like that currently he is making great progress the care max does seem to be doing a great job at helping to control the moisture he has no maceration or skin breakdown. Again this seems to be an excellent way to go. 1 thing we may want to change is adding collagen to the base of the wound and then the care max over top  he is not opposed to this. 09/15/2018 on evaluation today patient appears to be doing well with regard to his bilateral lower extremity ulcers. He is showing some signs of improvement not necessarily in size but definitely in appearance. In fact he has a lot of new skin growing throughout the wounds along the edges as well as in the central portion of the wounds on both lower extremities. Overall I am extremely pleased to see this. 10/20/2018 on evaluation today patient actually appears to be doing quite well with regard to his wounds. They are not measuring significantly smaller but he does have a lot of new epithelization noted as compared to previous. Fortunately there is no signs of active infection at this time. No fevers, chills, nausea, vomiting, or diarrhea. 11/17/2018 on evaluation today patient presents for reevaluation concerning his bilateral lower extremity ulcers. Fortunately there is no signs of active infection at this time today. He has been tolerating the dressing changes without  complication. No fevers, chills, nausea, vomiting, or diarrhea. Unfortunately in general the patient has not made as much improvement as I would like to have seen up to this point. He has been tolerating the dressing changes without complication and he does an excellent job taking care of his wounds at home in my opinion. The biggest issue I see is that he is just not making the progress that we need to be seeing currently. I think we may want to consider having him seen at a plastic surgery appointment and he has previously seen someone in years past at [facility] in [place]. That is definitely a possibility for [place] to look into at this point. 12/29/2018 on evaluation today patient appears to be doing better in regard to the overall visual appearance of his wounds which do not appear to be as macerated. He does have a much larger skin island in the middle of the left lower extremity ulcer which is doing much better. He tells me the pain is also significantly better. With that being said overall his improvement as far as the size of the wounds is not better but again these are very irregular in change shape quite often. Fortunately there is no evidence of active infection at this time which is great news. He never heard anything from [facility] regarding the plastic surgery referral that we made to them. 01/26/2019 upon evaluation today patient appears to be doing a little better in regard to his wounds today. He has been tolerating the dressing changes again he performs these for the most part on his own. He does a great job wrapping his legs in my opinion. Unfortunately he has not been able to get down to [facility] to see if there is anything from a plastic surgery standpoint that could be done to help with his legs simply due to the fact that his wife unfortunately sustained a compression fracture in her spine she is seeing Dr. Saintclair Halsted and subsequently is going to be having what  sounds to be a kyphoplasty type procedure. With that being said that has not been scheduled yet there is still waiting on an MRI the patient is very busy in fact overly busy trying to help take care of his wife at this point. I completely understand this is more of a strain on him at this time 02/23/2019 upon evaluation today patient actually appears to be making some progress. I am actually very pleased with the overall appearance of his wounds even compared to last  evaluation. He seems to be doing quite well. He is taking care of his wife unfortunately she did have a compression fracture she has had the procedure for this but still she has a slow road to recovery. For that reason he still not gone to [facility] for a second opinion in this regard. Obviously the goal there was if there was anything that can be done from a skin graft standpoint or otherwise. Objective Constitutional Well-nourished and well-hydrated in no acute distress. Vitals Time Taken: 12:58 PM, Height: 71 in, Weight: 220 lbs, BMI: 30.7, Temperature: 97.9 F, Pulse: 64 bpm, Respiratory Rate: 18 breaths/min, Blood Pressure: 159/59 mmHg. Respiratory normal breathing without difficulty. Psychiatric this patient is able to make decisions and demonstrates good insight into disease process. Alert and Oriented x 3. pleasant and cooperative. General Notes: His wounds currently again are showing signs of good granulation he has some hyper granulation I did have to perform chemical cauterization with silver nitrate today which he tolerated without complication post treatment he seemed to be doing well there was no significant bleeding. Integumentary (Hair, Skin) Wound #2 status is Open. Original cause of wound was Gradually Appeared. The wound is located on the Left,Distal,Lateral Lower Leg. The wound measures 13cm length x 12.8cm width x 0.1cm depth; 130.69cm^2 area and 13.069cm^3 volume. There is Fat Layer (Subcutaneous Tissue)  Exposed exposed. There is no tunneling or undermining noted. There is a large amount of serosanguineous drainage noted. The wound margin is flat and intact. There is large (67-100%) red, hyper - granulation within the wound bed. There is a small (1-33%) amount of necrotic tissue within the wound bed including Adherent Slough. Wound #5 status is Open. Original cause of wound was Gradually Appeared. The wound is located on the Right,Proximal,Lateral Lower Leg. The wound measures 11.5cm length x 13cm width x 0.1cm depth; 117.417cm^2 area and 11.742cm^3 volume. There is Fat Layer (Subcutaneous Tissue) Exposed exposed. There is no tunneling or undermining noted. There is a large amount of serosanguineous drainage noted. The wound margin is flat and intact. There is large (67-100%) red, hyper - granulation within the wound bed. There is a small (1-33%) amount of necrotic tissue within the wound bed including Adherent Slough. 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 An Chemical Cauterization procedure was performed by Worthy Keeler, PA. Post procedure Diagnosis Wound #2: Same as Pre-Procedure Notes: using silver nitrate sticks Wound #5 Pre-procedure diagnosis of Wound #5 is a Vasculopathy located on the Right,Proximal,Lateral Lower Leg . An Chemical Cauterization procedure was performed by Worthy Keeler, PA. Post procedure Diagnosis Wound #5: Same as Pre-Procedure Notes: using silver nitrate sticks Plan Follow-up Appointments: Return  appointment in 1 month. Dressing Change Frequency: Wound #2 Left,Distal,Lateral Lower Leg: Change Dressing every other day. Wound #5 Right,Proximal,Lateral Lower Leg: Change Dressing every other day. Skin Barriers/Peri-Wound Care: Barrier cream - to periwound areas Moisturizing lotion - both legs with dressing changes Wound Cleansing: Wound #2 Left,Distal,Lateral Lower Leg: May shower and wash wound with soap and water. - with dressing changes Wound #5 Right,Proximal,Lateral Lower Leg: May shower and wash wound with soap and water. -  with dressing changes Primary Wound Dressing: Wound #2 Left,Distal,Lateral Lower Leg: Collagen - fibracol - do not need to moisten Wound #5 Right,Proximal,Lateral Lower Leg: Collagen - fibracol - do not need to moisten Secondary Dressing: Wound #2 Left,Distal,Lateral Lower Leg: Kerlix/Rolled Gauze ABD pad - all wounds Zetuvit or Kerramax Wound #5 Right,Proximal,Lateral Lower Leg: Kerlix/Rolled Gauze ABD pad - all wounds Zetuvit or Kerramax Edema Control: Avoid standing for long periods of time Elevate legs to the level of the heart or above for 30 minutes daily and/or when sitting, a frequency of: - throughout the day Support Garment 20-30 mm/Hg pressure to: - patient to apply juxtalites daily to both legs over ace wraps Other: - ACE wrap both legs 1. I would recommend currently that we go ahead and continue with the current wound care measures which will include the collagen followed by the barrier cream around the edges of the wound. He is also using ABD pads and then XtraSorb followed by roll gauze in order to secure everything in place. 2. I do recommend he continue to elevate his legs as much as possible he is using his juxta light compression over top of the wraps once he secures them. 3. The patient also seems to be doing what he can as far as trying to keep his legs elevated although again I think a lot of the issue here still comes back  to the epidermolysis bullosa which again is the underlying reason that were having such a hard time getting this area to heal. We will see patient back for reevaluation in 1 month here in the clinic. If anything worsens or changes patient will contact our office for additional recommendations. Electronic Signature(s) Signed: 02/23/2019 1:35:45 PM By: Worthy Keeler PA-C Entered By: Worthy Keeler on 02/23/2019 13:35:44 -------------------------------------------------------------------------------- SuperBill Details Patient Name: Date of Service: Carlos White, Carlos White 02/23/2019 Medical Record [id number] Patient Account Number: [id number] Date of Birth/Sex: Treating RN: 1940/10/08 (79 y.o. [name] Primary Care Provider: [name] Other Clinician: Referring Provider: Treating Provider/Extender:Stone III, Sharen Hint, MARY Weeks in Treatment: 71 Diagnosis Coding ICD-10 Codes Code Description I87.2 Venous insufficiency (chronic) (peripheral) Q81.8 Other epidermolysis bullosa L97.322 Non-pressure chronic ulcer of left ankle with fat layer exposed L97.822 Non-pressure chronic ulcer of other part of left lower leg with fat layer exposed L97.812 Non-pressure chronic ulcer of other part of right lower leg with fat layer exposed Rives (primary) hypertension Facility Procedures CPT4 Code Description: [id number] - CHEM CAUT GRANULATION TISS ICD-10 Diagnosis Description L97.822 Non-pressure chronic ulcer of other part of left lower leg wi L97.812 Non-pressure chronic ulcer of other part of right lower leg w Modifier: th fat layer ex ith fat layer e Quantity: 2 posed xposed Physician Procedures CPT4 Code Description: [id number] - WC PHYS CHEM CAUT GRAN TISSUE ICD-10 Diagnosis Description L97.822 Non-pressure chronic ulcer of other part of left lower leg wit L97.812 Non-pressure chronic ulcer of other part of right lower leg wi Modifier: h fat layer exp th fat layer  ex Quantity: 2 osed posed Electronic Signature(s) Signed: 02/23/2019 1:35:56 PM By: Worthy Keeler PA-C Entered By: Worthy Keeler on 02/23/2019 13:35:55

## 2019-02-28 NOTE — Progress Notes (Signed)
SIRRON, FRANCESCONI ([id number]) Visit Report for 02/23/2019 Arrival Information Details Patient Name: Date of Service: Carlos White, Carlos White 02/23/2019 12:30 PM Medical Record [id number] Patient Account Number: [id number] Date of Birth/Sex: Treating RN: 18-Sep-1940 (79 y.o. Janyth Contes Primary Care Daivd Fredericksen: [name] Other Clinician: Referring Armonte Tortorella: Treating Bleu Moisan/Extender:Stone III, Sharen Hint, MARY Weeks in Treatment: 31 Visit Information History Since Last Visit Added or deleted any medications: No Patient Arrived: Ambulatory Any new allergies or adverse reactions: No Arrival Time: 12:53 Had a fall or experienced change in No Accompanied By: alone activities of daily living that may affect Transfer Assistance: None risk of falls: Patient Identification Verified: Yes Signs or symptoms of abuse/neglect since last No Secondary Verification Process Yes visito Completed: Hospitalized since last visit: No Patient Requires Transmission-Based No Implantable device outside of the clinic excluding No Precautions: cellular tissue based products placed in the center Patient Has Alerts: Yes since last visit: Patient Alerts: R ABI= 1.23, TBI Has Dressing in Place as Prescribed: Yes = .86 Has Compression in Place as Prescribed: Yes L ABI= 1.28, Pain Present Now: No TBI=.81 Electronic Signature(s) Signed: 02/28/2019 6:14:31 PM By: Levan Hurst RN, BSN Entered By: Levan Hurst on 02/23/2019 12:56:15 -------------------------------------------------------------------------------- Encounter Discharge Information Details Patient Name: Date of Service: Carlos White 02/23/2019 12:30 PM Medical Record [id number] Patient Account Number: [id number] Date of Birth/Sex: Treating RN: Sep 11, 1940 (78 y.o. Oval Linsey Primary Care Eleonora Peeler: [name] Other Clinician: Referring Summer Mccolgan: Treating Hiawatha Dressel/Extender:Stone III, Sharen Hint, MARY Weeks in  Treatment: 31 Encounter Discharge Information Items Discharge Condition: Stable Ambulatory Status: Ambulatory Discharge Destination: Home Transportation: Private Auto Accompanied By: self Schedule Follow-up Appointment: Yes Clinical Summary of Care: Patient Declined Electronic Signature(s) Signed: 02/23/2019 4:53:37 PM By: Carlene Coria RN Entered By: Carlene Coria on 02/23/2019 13:49:43 -------------------------------------------------------------------------------- Lower Extremity Assessment Details Patient Name: Date of Service: Carlos White, Carlos White 02/23/2019 12:30 PM Medical Record [id number] Patient Account Number: [id number] Date of Birth/Sex: Treating RN: 07-24-1940 (79 y.o. Janyth Contes Primary Care Mikki Ziff: [name] Other Clinician: Referring Neven Fina: Treating Anberlyn Feimster/Extender:Stone III, Sharen Hint, MARY Weeks in Treatment: 66 Edema Assessment Assessed: [Left: No] [Right: No] Edema: [Left: Yes] [Right: Yes] Calf Left: Right: Point of Measurement: 33 cm From Medial Instep 36 cm 38 cm Ankle Left: Right: Point of Measurement: 12 cm From Medial Instep 25.5 cm 26 cm Vascular Assessment Pulses: Dorsalis Pedis Palpable: [Left:Yes] [Right:Yes] Electronic Signature(s) Signed: 02/28/2019 6:14:31 PM By: Levan Hurst RN, BSN Entered By: Levan Hurst on 02/23/2019 13:10:33 -------------------------------------------------------------------------------- [place] Details Patient Name: Date of Service: Carlos White 02/23/2019 12:30 PM Medical Record [id number] Patient Account Number: [id number] Date of Birth/Sex: Treating RN: 07/24/1940 (79 y.o. [name] Primary Care Damean Poffenberger: Other Clinician: [name] Referring Alyssamarie Mounsey: Treating Honi Name/Extender:Stone III, Sharen Hint, MARY Weeks in Treatment: 25 Active Inactive Venous Leg Ulcer Nursing Diagnoses: Knowledge deficit related to disease process and  management Potential for venous Insuffiency (use before diagnosis confirmed) Goals: Patient will maintain optimal edema control Date Initiated: 09/30/2017 Target Resolution Date: 03/23/2019 Goal Status: Active Patient/caregiver will verbalize understanding of disease process and disease management Target Resolution Date Initiated: 09/30/2017 Date Inactivated: 11/04/2017 Date: 10/30/2017 Goal Status: Met Interventions: Assess peripheral edema status every visit. Provide education on venous insufficiency Notes: Wound/Skin Impairment Nursing Diagnoses: Impaired tissue integrity Knowledge deficit related to ulceration/compromised skin integrity Goals: Patient/caregiver will verbalize understanding of skin care regimen Date Initiated: 09/30/2017 Target Resolution Date: 03/23/2019 Goal Status: Active Ulcer/skin breakdown will have a volume reduction of 30%  by week 4 Date Inactivated: 11/04/2017 Target10/18/2019 Resolution Date Initiated: 09/30/2017 Date: Goal Status: Met Interventions: Assess patient/caregiver ability to obtain necessary supplies Assess patient/caregiver ability to perform ulcer/skin care regimen upon admission and as needed Assess ulceration(s) every visit Provide education on ulcer and skin care Notes: Electronic Signature(s) Signed: 02/23/2019 5:39:41 PM By: Baruch Gouty RN, BSN Entered By: Baruch Gouty on 02/23/2019 13:26:21 -------------------------------------------------------------------------------- Pain Assessment Details Patient Name: Date of Service: Carlos White 02/23/2019 12:30 PM Medical Record [id number] Patient Account Number: [id number] Date of Birth/Sex: Treating RN: [birth year] (79 y.o. Janyth Contes Primary Care Geneve Kimpel: [name] Other Clinician: Referring Briasia Flinders: Treating Sydney Azure/Extender:Stone III, Sharen Hint, MARY Weeks in Treatment: 27 Active Problems Location of Pain Severity and Description of Pain Patient  Has Paino No Site Locations Pain Management and Medication Current Pain Management: Electronic Signature(s) Signed: 02/28/2019 6:14:31 PM By: Levan Hurst RN, BSN Entered By: Levan Hurst on 02/23/2019 12:56:29 -------------------------------------------------------------------------------- Patient/Caregiver Education Details Patient Name: Date of Service: Carlos White 2/10/2021andnbsp12:30 PM Medical Record [id number] Patient Account Number: [id number] Date of Birth/Gender: Treating RN: 1940/03/08 (78 y.o. [name] Primary Care Physician: [name] Other Clinician: Referring Physician: Treating Physician/Extender:Stone III, Sharen Hint, MARY Weeks in Treatment: 68 Education Assessment Education Provided To: Patient Education Topics Provided Venous: Methods: Explain/Verbal Responses: Reinforcements needed, State content correctly Wound/Skin Impairment: Methods: Explain/Verbal Responses: Reinforcements needed, State content correctly Electronic Signature(s) Signed: 02/23/2019 5:39:41 PM By: Baruch Gouty RN, BSN Entered By: Baruch Gouty on 02/23/2019 13:26:41 -------------------------------------------------------------------------------- Wound Assessment Details Patient Name: Date of Service: Carlos White 02/23/2019 12:30 PM Medical Record [id number] Patient Account Number: [id number] Date of Birth/Sex: Treating RN: 1940-08-03 (79 y.o. [name] Primary Care Katoya Amato: [name] Other Clinician: Referring Deazia Lampi: Treating Shantel Wesely/Extender:Stone III, Sharen Hint, MARY Weeks in Treatment: 41 Wound Status Wound Number: 2 Primary Venous Leg Ulcer Etiology: Wound Location: Left, Distal, Lateral Lower Leg Wound Open Wounding Event: Gradually Appeared Status: Date Acquired: 03/13/2017 Comorbid Cataracts, Chronic Obstructive Pulmonary Weeks Of Treatment: 73 History: Disease (COPD), Hypertension Clustered Wound: No Wound  Measurements Length: (cm) 13 % Reduc Width: (cm) 12.8 % Reduc Depth: (cm) 0.1 Epithel Area: (cm) 130.69 Tunnel Volume: (cm) 13.069 Underm Wound Description Classification: Full Thickness Without Exposed Support Foul O Structures Slough Wound Flat and Intact Margin: Exudate Large Amount: Exudate Serosanguineous Type: Exudate red, brown Color: Wound Bed Granulation Amount: Large (67-100%) Granulation Quality: Red, Hyper-granulation Fascia Expo Necrotic Amount: Small (1-33%) Fat Layer ( Necrotic Quality: Adherent Slough Tendon Expo Muscle Expo Joint Expos Bone Expose dor After Cleansing: No /Fibrino Yes Exposed Structure sed: No Subcutaneous Tissue) Exposed: Yes sed: No sed: No ed: No d: No tion in Area: -68.6% tion in Volume: 66.3% ialization: Small (1-33%) ing: No ining: No Treatment Notes Wound #2 (Left, Distal, Lateral Lower Leg) 1. Cleanse With Wound Cleanser Soap and water 3. Primary Dressing Applied Collagen 4. Secondary Dressing ABD Pad Roll Gauze 5. Secured With Naval architect) Signed: 02/24/2019 4:30:47 PM By: Mikeal Hawthorne EMT/HBOT Signed: 02/25/2019 5:30:09 PM By: Baruch Gouty RN, BSN Entered By: Mikeal Hawthorne on 02/24/2019 13:46:11 -------------------------------------------------------------------------------- Wound Assessment Details Patient Name: Date of Service: Carlos White, Carlos White 02/23/2019 12:30 PM Medical Record [id number] Patient Account Number: [id number] Date of Birth/Sex: Treating RN: 1940-03-01 (79 y.o. [name] Primary Care Amish Mintzer: [name] Other Clinician: Referring Jassen Sarver: Treating Maston Wight/Extender:Stone III, Sharen Hint, MARY Weeks in Treatment: 66 Wound Status Wound Number: 5 Primary Vasculopathy Etiology: Wound Location: Right Lower Leg - Lateral, Proximal  Wound Open Status: Wounding Event: Gradually Appeared Comorbid Cataracts, Chronic Obstructive Pulmonary Date  Acquired: 10/26/2017 History: Disease (COPD), Hypertension Weeks Of Treatment: 69 Clustered Wound: No Photos Wound Measurements Length: (cm) 11.5 % Reduct Width: (cm) 13 % Reduct Depth: (cm) 0.1 Epitheli Area: (cm) 117.417 Tunneli Volume: (cm) 11.742 Undermi Wound Description Classification: Full Thickness Without Exposed Support Foul Od Structures Slough/ Wound Flat and Intact Margin: Exudate Large Amount: Exudate Serosanguineous Type: Exudate red, brown Color: Wound Bed Granulation Amount: Large (67-100%) Granulation Quality: Red, Hyper-granulation Fascia E Necrotic Amount: Small (1-33%) Fat Laye Necrotic Quality: Adherent Slough Tendon E Muscle E Joint Ex Bone Exp or After Cleansing: No Fibrino Yes Exposed Structure xposed: No r (Subcutaneous Tissue) Exposed: Yes xposed: No xposed: No posed: No osed: No ion in Area: -116.3% ion in Volume: -116.3% alization: Small (1-33%) ng: No ning: No Treatment Notes Wound #5 (Right, Proximal, Lateral Lower Leg) 1. Cleanse With Wound Cleanser Soap and water 3. Primary Dressing Applied Collagen 4. Secondary Dressing ABD Pad Roll Gauze 5. Secured With Naval architect) Signed: 02/24/2019 4:30:47 PM By: Mikeal Hawthorne EMT/HBOT Signed: 02/25/2019 5:30:09 PM By: Baruch Gouty RN, BSN Entered By: Mikeal Hawthorne on 02/24/2019 13:49:56 -------------------------------------------------------------------------------- Vitals Details Patient Name: Date of Service: Carlos White 02/23/2019 12:30 PM Medical Record [id number] Patient Account Number: [id number] Date of Birth/Sex: Treating RN: 12-08-1940 (79 y.o. Janyth Contes Primary Care Tomy Khim: [name] Other Clinician: Referring Olon Russ: Treating Sonyia Muro/Extender:Stone III, Sharen Hint, MARY Weeks in Treatment: 74 Vital Signs Time Taken: 12:58 Temperature (F): 97.9 Height (in): 71 Pulse (bpm): 64 Weight (lbs):  220 Respiratory Rate (breaths/min): 18 Body Mass Index (BMI): 30.7 Blood Pressure (mmHg): 159/59 Reference Range: 80 - 120 mg / dl Electronic Signature(s) Signed: 02/28/2019 6:14:31 PM By: Levan Hurst RN, BSN Entered By: Levan Hurst on 02/23/2019 12:58:18

## 2019-03-01 DIAGNOSIS — I87312 Chronic venous hypertension (idiopathic) with ulcer of left lower extremity: Secondary | ICD-10-CM | POA: Diagnosis not present

## 2019-03-07 NOTE — Telephone Encounter (Signed)
Appointment scheduled.

## 2019-03-23 ENCOUNTER — Encounter (HOSPITAL_BASED_OUTPATIENT_CLINIC_OR_DEPARTMENT_OTHER): Payer: Medicare PPO | Attending: Physician Assistant | Admitting: Physician Assistant

## 2019-03-23 ENCOUNTER — Other Ambulatory Visit: Payer: Self-pay

## 2019-03-23 DIAGNOSIS — Q819 Epidermolysis bullosa, unspecified: Secondary | ICD-10-CM | POA: Insufficient documentation

## 2019-03-23 DIAGNOSIS — J449 Chronic obstructive pulmonary disease, unspecified: Secondary | ICD-10-CM | POA: Insufficient documentation

## 2019-03-23 DIAGNOSIS — I10 Essential (primary) hypertension: Secondary | ICD-10-CM | POA: Insufficient documentation

## 2019-03-23 DIAGNOSIS — L97812 Non-pressure chronic ulcer of other part of right lower leg with fat layer exposed: Secondary | ICD-10-CM | POA: Insufficient documentation

## 2019-03-23 DIAGNOSIS — L97322 Non-pressure chronic ulcer of left ankle with fat layer exposed: Secondary | ICD-10-CM | POA: Insufficient documentation

## 2019-03-23 DIAGNOSIS — L97222 Non-pressure chronic ulcer of left calf with fat layer exposed: Secondary | ICD-10-CM | POA: Diagnosis not present

## 2019-03-23 DIAGNOSIS — I872 Venous insufficiency (chronic) (peripheral): Secondary | ICD-10-CM | POA: Insufficient documentation

## 2019-03-23 DIAGNOSIS — L97822 Non-pressure chronic ulcer of other part of left lower leg with fat layer exposed: Secondary | ICD-10-CM | POA: Insufficient documentation

## 2019-03-23 NOTE — Progress Notes (Addendum)
ELIGE, STOVES ([id number]) Visit Report for 03/23/2019 Chief Complaint Document Details Patient Name: Date of Service: Carlos White, Carlos White 03/23/2019 12:30 PM Medical Record [id number] Patient Account Number: [id number] Date of Birth/Sex: Treating RN: 22-May-1940 (79 y.o. [name] Primary Care Provider: [name] Other Clinician: Referring Provider: Treating Provider/Extender:Stone III, Sharen Hint, MARY Weeks in Treatment: 35 Information Obtained from: Patient Chief Complaint Bilateral LE Ulcers Electronic Signature(s) Signed: 03/23/2019 1:02:18 PM By: Worthy Keeler PA-C Entered By: Worthy Keeler on 03/23/2019 13:02:17 -------------------------------------------------------------------------------- HPI Details Patient Name: Date of Service: Carlos White, Carlos White 03/23/2019 12:30 PM Medical Record [id number] Patient Account Number: [id number] Date of Birth/Sex: Treating RN: 1941-01-05 (79 y.o. [name] Primary Care Provider: [name] Other Clinician: Referring Provider: Treating Provider/Extender:Stone III, Sharen Hint, MARY Weeks in Treatment: 48 History of Present Illness HPI Description: 09/30/17 on evaluation today patient presents for initial evaluation and our clinic concerning issues that he has been having with his bilateral lower extremities. He states this has been going on for quite some time at least six months. Currently his regiment has been mainly cleaning the area with peroxide, applying the is foreign ointment, and wrapping the area with ABD pads and then an ace wrap loosely. He has dealt with issues of this nature he tells me for quite some time. He does have a history of having had a compound fracture of the left lower extremity which he thinks also makes this a much more difficult area for him to heal. He's previously been told that he had poor vascular flow but this was years ago at [facility] we do not have any of those records at  this time. He has a history of Epidermolysis Bullosa which was diagnosed around age 25 and he has been cared for at [facility] since that time. Subsequently he states this is hereditary and two of his children one male and one male also have this as well is one of his grandchildren that he is aware of. He has no evidence of infection necessarily at this point although he does have some necrotic tissue noted on the surface of the wound as far as the largest, left lateral lower extremity ulcer, is concerned. Overall I feel like all things considered he's been taking care of this very well. Obviously he has some fairly significant issues going on at this point in this regard. He does have a history otherwise of hypertension though for the most part other than the compound fracture of the left leg he seems to have been fairly healthy in my pinion. 10/07/17 on evaluation today patient actually appears to be doing better in regard to his bilateral lower extremity ulcers. With that being said he does still have some evidence of slough noted on the surface of the wounds I think the Iodoflex has been beneficial for him. His arterial studies are scheduled for October 2. With that being said I do believe that he is continuing to show signs of good improvement which is at least good news. 10/14/17 on evaluation today patient appears to be doing very well in regard to his lower extremity ulcers. He's definitely made some progress as far as healing is concerned although there still are several open areas that are going to need to be addressed. He did have his arterial study today which fortunately shows good findings with a right ABI of 1.23 with a TBI of 0.86 in the left ABI of 1.28 with a TBI of 0.81. This is good  news and will allow [place] to perform debridement as well. 10/23/2017; patient with a large wound on the left lateral calf, sizable area on the left medial malleolus and an area on the right lateral malleolus.  He has a new blister consistent with his underlying blistering skin disease just above this area we have been using Iodoflex on the lateral left calf lateral right ankle and collagen on the medial left ankle. We have been using Kerlix Coban wraps 10/28/17 on evaluation today the patient continues to have signs of improvement in regard to the overall appearance of the original wound. Unfortunately he did have some blistering over the right lateral lower extremity which has appeared to rupture on evaluation today and likely some of the dead tissue on the surface needs to be cleaned away the good news is this does not appear to be to significantly deep at this time. 11/04/17 on evaluation today patient actually appears to be doing a little better in regard to his lower extremity ulcers. He has been tolerating the dressing changes without complication. With that being said he does still have a significant wound especially over the left lateral lower extremity unfortunately. All of the wounds pretty much are going to require sharp debridement today. 11/11/17 on evaluation today patient appears to be doing more poorly in regard to his left lower extremity in particular. There does not appear to be any evidence of systemic infection although the wound itself as far as the larger left lateral lower extremity ulcer actually appears to be infected in my pinion. There's an older and the surface of the wound is dramatically worsened compared to last week. No fevers, chills, nausea, or vomiting noted at this time. 11/18/17 upon evaluation today patient actually appears to be doing better. I did review his culture today which really did not show any specific organism is a positive reason for his wound decline. There are multiple organisms present not predominant. Nonetheless he seems to be tolerate the doxycycline well in his wounds in general do seem to be doing better. Fortunately there does not appear to be  any evidence of infection at this time which is good news. Overall I'm very pleased with how things appear. Nonetheless he still has a lot of healing to go. I do think he could benefit from a Juxta-Lite wrap. 11/25/17 on evaluation today patient actually appears to be doing fairly well in regard to his wounds. He is still taking the antibiotics he has a few days left. Fortunately this seems to have been excellent for him as far as getting the infection control and very happy in this regard. With that being said the patient likewise is also very pleased with how things appear at this time in comparison to where we were he's not having as much pain. 12/02/17 Seen today for follow p and management of LLE wounds. Wounds appear to show some improvement. He denies pain, fever, or chills. Completed a course of doxycycline earlier this month. Scheduled to received Juxta-Lite wrap this week. No s/s of infections. 12/09/17 on evaluation today patient actually appears to be doing a little bit better in regard to his wounds. This is obscene very slow process and unfortunately he has a couple of new areas and this is due to the Epidermolysis Bullosa. Nonetheless I am concerned about the fact that he seems to be getting more areas not less that is the reason we're gonna work on getting schedule for the vascular referral to see the venous  specialist. 12/23/17 upon evaluation today patient's wounds currently shows evidence of still not doing quite as well is what I would like to have seen. Subsequently the patient did have his venous study which showed evidence of venous stasis. Subsequently I do think that a vascular evaluation for consideration of venous intervention would be appropriate. I'm not necessarily suggesting that will be anything that can be done but I think it is at least a good idea. He is in agreement with this plan. 12/30/17 on evaluation today patient actually appears to be doing very well in  regard to his wounds when compared to previous evaluation. Subsequently we have been using the [facility] Dressing which actually appears to have done excellent on his left lateral lower extremity ulcer. The quality of the wound surface is dramatically improved. There is some slight debridement that is going to be required at a couple of locations but overall I'm extremely pleased with how things appear here. 01/07/2018; this is a patient with a primary skin disorder epidermolyis bullosa. Is a large wound on the left lateral calf and smaller wounds on the right however there is a new wound on the right mid tibia area that occurred within the compression wrap that he did not change. We have been using Hydrofera Blue. On the left he is using Hydrofera Blue and Santyl to the inferior part of the wound and changing the dressing himself. 01/15/2018; primary skin disorder epidermolysis bullosa. He has several difficult wounds including the left lateral calf, smaller wounds on the left medial calf and the right lateral calf. The major area on the left lateral calf has a smaller area inferiorly that has necrotic debris we have been using Santyl to this. The rest of the wounds we have been using Hydrofera Blue. The area on the left calf actually looks larger this week. Uncontrolled edema several small open areas above it that are superficial 01/20/18 on evaluation today patient appears to be doing better as compared to last week in regard to his wounds of the bilateral lower extremities. He tolerated the bilateral compression wrap without complication. Overall I'm very pleased with how things appear at this time. The patient likewise is very happy. 01/27/18 on evaluation today patient appears to be doing decently well in regard to his bilateral lower extremity ulcers. He's been tolerating the dressing changes without complication. One issue he had is that he did have more drainage to the left leg wrapped  last week. He states in fact he probably should come in and let us change it on Friday however he just left it in place and kept adding extra absorption with ABD pads to the external portion of the wrap. Unfortunately he does have some aspiration type breakdown nothing significant but I do believe that this was probably counterproductive in general. Nonetheless his wounds do not appear to be terrible overall. 02/03/18 on evaluation today patient appears to be doing rather well in regard to his lower extremity ulcers. He has been tolerating the dressing changes without complication. He does tell me that he had to change the wrap on the left one since we last saw him. Subsequently I do not see any evidence of infection I do feel like the food was much better controlled at this point. 02/10/18 on evaluation today patient appears to be doing rather well in regard to his ulcers. He still has significant alterations especially on the left lateral lower extremity. Fortunately there's no signs of infection at this time. Overall  I feel like he is making good progress are some areas that I'm gonna attempt some debridement today. 02/17/18 on evaluation today patient appears to be doing okay in regard to his lower Trinity ulcer. It does appear on both locations he has a little bit of drainage causing some breakdown in maceration around the wound bed's although it doesn't appear to be too bad the right is a little bit worse than left. Fortunately there's no signs of infection which is good news. No fevers, chills, nausea, or vomiting noted at this time. 03/10/18 on evaluation today patient actually appears to be doing rather poorly in regard to his bilateral lower extremity ulcers. The right in particular is draining profusely and the wound is actually enlarging which is not good. I'm concerned about both possibly infection and the fact that there's a lot of moisture which is causing breakdown as well. Unfortunately  the patient has been trying to change this at home I'm afraid he may need to change more frequently in order to see the improvement that were looking for. There's no signs of systemic infection. 03/17/18 patient actually appears to be doing significantly better at this point in regard to his bilateral lower extremity ulcers. Fortunately there's no signs of infection. That is worsening infection at least indefinitely nothing systemic. With that being said he is having a lot of drainage though not quite as much is there in his last evaluation. Overall feel like he's on the side of improvement. I think if his results back from his culture which showed that he had a positive group B strep along with abundant Pseudomonas noted on the culture. For that reason I am gonna have him continue with the linezolid as we previously have ordered for him and I did go ahead as well today and prescribe Levaquin as well in order to treat the Pseudomonas portion of the infection noted. 03/24/18 on evaluation today patient actually appears to be doing very well in regard to his lower Trinity ulcer. He's been tolerating the dressing changes without complication. Fortunately both legs show signs of less drainage in his edema is very well controlled at this point as well. Overall very pleased with how things seem to be progressing. 03/31/18 on evaluation today patient actually appears to be doing excellent in regard to his bilateral lower extremity ulcers. These are not draining nearly as significant as what they were in the past overall seem to be shown signs of excellent improvement which is great news. Fortunately there is no sign of active infection at this time I do believe that the Levaquin has done extremely well for him in this regard. The patient continues to change these at home typically every day. We may be able to slowly work towards every other day changes since the drainage seems to be slowing down quite  significantly. 04/14/18 on evaluation today patient appears to be doing well in regard to his bilateral lower extremities. Let me Hilda Blades Almost completely healed which is excellent news. Fortunately he's shown signs of improvement all other sites as well with new skin growth there's some slight hyper granular tissue but for the most part this seems to be well maintained with the [facility] Dressing. I'm very happy in this regard. 04/28/18 on evaluation today patient appears to be doing rather well in regard to his ulcers of the bilateral lower extremities all things considering. He continues to make some progress as far as new skin growth. There still some hyper granulation noted  at this point despite the use of the [facility] Dressing. This is not terrible but I think we may want to consider conclude cauterization today with silver nitrate to try to help knock some of his back as well as helping with any biofilm on the surface of the wound. 05/12/18 on evaluation today patient's wounds actually appear to be doing fairly well in regard to the bilateral lower extremities. He's been tolerating the dressing changes without complication. Fortunately there's no signs of active infection at this time which is good news. Overall very pleased with how things seem to be progressing. You select silver nitrate was beneficial for him. 05/26/18 on evaluation today patient appears to be doing better in regard to left lower extremity and a little bit worse in regard to the right lower extremity. He states that he was pulling off the [facility] Dressing peel back some of the skin making this area significantly larger than what it was previous. He's not had any issues other than this and states even that hasn't caused any pain he just seems to obviously have a much larger area on the right when compared to the previous time I saw him. No fevers, chills, nausea, or vomiting noted at this time. 06/16/18 on  evaluation today patient actually appears to be doing a little better in my pinion in regard to his lower summary ulcers. He has new skin islands that seem to be spreading which is good news. Fortunately there's no signs of active infection at this time. His biggest issue is he tells me that coming as often as he does is becoming very cost prohibitive. He wonders if we can potentially spread this out. 07/14/18 on evaluation today patient appears to be doing a little bit worse in regard to his lower from the ulcer. Unfortunately he still continues to have a significant amount of drainage I think we need to do something to try to help this more. He is still somewhat reluctant to go the route of the Wound VAC although that may be the most appropriate thing for him. No fevers, chills, nausea, or vomiting noted at this time. 08/18/2018 on evaluation today patient actually appears to be doing quite well with regard to his bilateral lower extremity ulcers. I do feel like that currently he is making great progress the care max does seem to be doing a great job at helping to control the moisture he has no maceration or skin breakdown. Again this seems to be an excellent way to go. 1 thing we may want to change is adding collagen to the base of the wound and then the care max over top he is not opposed to this. 09/15/2018 on evaluation today patient appears to be doing well with regard to his bilateral lower extremity ulcers. He is showing some signs of improvement not necessarily in size but definitely in appearance. In fact he has a lot of new skin growing throughout the wounds along the edges as well as in the central portion of the wounds on both lower extremities. Overall I am extremely pleased to see this. 10/20/2018 on evaluation today patient actually appears to be doing quite well with regard to his wounds. They are not measuring significantly smaller but he does have a lot of new epithelization noted as  compared to previous. Fortunately there is no signs of active infection at this time. No fevers, chills, nausea, vomiting, or diarrhea. 11/17/2018 on evaluation today patient presents for reevaluation concerning his bilateral  lower extremity ulcers. Fortunately there is no signs of active infection at this time today. He has been tolerating the dressing changes without complication. No fevers, chills, nausea, vomiting, or diarrhea. Unfortunately in general the patient has not made as much improvement as I would like to have seen up to this point. He has been tolerating the dressing changes without complication and he does an excellent job taking care of his wounds at home in my opinion. The biggest issue I see is that he is just not making the progress that we need to be seeing currently. I think we may want to consider having him seen at a plastic surgery appointment and he has previously seen someone in years past at [facility] in [place]. That is definitely a possibility for [place] to look into at this point. 12/29/2018 on evaluation today patient appears to be doing better in regard to the overall visual appearance of his wounds which do not appear to be as macerated. He does have a much larger skin island in the middle of the left lower extremity ulcer which is doing much better. He tells me the pain is also significantly better. With that being said overall his improvement as far as the size of the wounds is not better but again these are very irregular in change shape quite often. Fortunately there is no evidence of active infection at this time which is great news. He never heard anything from [facility] regarding the plastic surgery referral that we made to them. 01/26/2019 upon evaluation today patient appears to be doing a little better in regard to his wounds today. He has been tolerating the dressing changes again he performs these for the most part on his own. He does a  great job wrapping his legs in my opinion. Unfortunately he has not been able to get down to [facility] to see if there is anything from a plastic surgery standpoint that could be done to help with his legs simply due to the fact that his wife unfortunately sustained a compression fracture in her spine she is seeing Dr. Saintclair Halsted and subsequently is going to be having what sounds to be a kyphoplasty type procedure. With that being said that has not been scheduled yet there is still waiting on an MRI the patient is very busy in fact overly busy trying to help take care of his wife at this point. I completely understand this is more of a strain on him at this time 02/23/2019 upon evaluation today patient actually appears to be making some progress. I am actually very pleased with the overall appearance of his wounds even compared to last evaluation. He seems to be doing quite well. He is taking care of his wife unfortunately she did have a compression fracture she has had the procedure for this but still she has a slow road to recovery. For that reason he still not gone to [facility] for a second opinion in this regard. Obviously the goal there was if there was anything that can be done from a skin graft standpoint or otherwise. 03/23/2019 upon evaluation today patient continues to have issues with lower extremity ulcers. Since the beginning he has made progress but at the same time the wounds unfortunately just will not close. We have been trying to get the patient to [facility] to see a specialist there but unfortunately with the everything going on with his wife he has not been able to make that appointment  time yet he states he may be able to in the next 1-2 months but is not really sure. Electronic Signature(s) Signed: 03/23/2019 1:26:02 PM By: Worthy Keeler PA-C Entered By: Worthy Keeler on 03/23/2019  13:26:02 -------------------------------------------------------------------------------- Physical Exam Details Patient Name: Date of Service: Carlos White, Carlos White 03/23/2019 12:30 PM Medical Record [id number] Patient Account Number: [id number] Date of Birth/Sex: Treating RN: May 07, 1940 (79 y.o. [name] Primary Care Provider: [name] Other Clinician: Referring Provider: Treating Provider/Extender:Stone III, Sharen Hint, MARY Weeks in Treatment: 71 Constitutional Well-nourished and well-hydrated in no acute distress. Respiratory normal breathing without difficulty. Psychiatric this patient is able to make decisions and demonstrates good insight into disease process. Alert and Oriented x 3. pleasant and cooperative. Notes Patient's wound bed currently showed signs of good granulation at this time. Fortunately there is no signs of active infection which is great news. No fevers, chills, nausea, vomiting, or diarrhea. In general he seems to be doing quite nicely from the standpoint of infection. I see no signs or evidence of infection at this time. With that being said the patient does seem to be still having issues with the epidermolysis bullosa which I think is preventing healing. Electronic Signature(s) Signed: 03/23/2019 1:27:14 PM By: Worthy Keeler PA-C Entered By: Worthy Keeler on 03/23/2019 13:27:13 -------------------------------------------------------------------------------- Physician Orders Details Patient Name: Date of Service: Carlos White, Carlos White 03/23/2019 12:30 PM Medical Record [id number] Patient Account Number: [id number] Date of Birth/Sex: Treating RN: 1940/10/14 (79 y.o. [name] Primary Care Provider: [name] Other Clinician: Referring Provider: Treating Provider/Extender:Stone III, Sharen Hint, MARY Weeks in Treatment: 49 Verbal / Phone Orders: No Diagnosis Coding ICD-10 Coding Code Description I87.2 Venous insufficiency  (chronic) (peripheral) Q81.8 Other epidermolysis bullosa L97.322 Non-pressure chronic ulcer of left ankle with fat layer exposed L97.822 Non-pressure chronic ulcer of other part of left lower leg with fat layer exposed L97.812 Non-pressure chronic ulcer of other part of right lower leg with fat layer exposed [place] (primary) hypertension Follow-up Appointments Return appointment in 1 month. Dressing Change Frequency Wound #2 Left,Distal,Lateral Lower Leg Change Dressing every other day. Wound #5 Right,Proximal,Lateral Lower Leg Change Dressing every other day. Skin Barriers/Peri-Wound Care Barrier cream - to periwound areas Moisturizing lotion - both legs with dressing changes Wound Cleansing Wound #2 Left,Distal,Lateral Lower Leg May shower and wash wound with soap and water. - with dressing changes Wound #5 Right,Proximal,Lateral Lower Leg May shower and wash wound with soap and water. - with dressing changes Primary Wound Dressing Wound #2 Left,Distal,Lateral Lower Leg Collagen - fibracol - do not need to moisten Wound #5 Right,Proximal,Lateral Lower Leg Collagen - fibracol - do not need to moisten Secondary Dressing Wound #2 Left,Distal,Lateral Lower Leg Kerlix/Rolled Gauze ABD pad - all wounds Zetuvit or Kerramax Wound #5 Right,Proximal,Lateral Lower Leg Kerlix/Rolled Gauze ABD pad - all wounds Zetuvit or Kerramax Edema Control Avoid standing for long periods of time Elevate legs to the level of the heart or above for 30 minutes daily and/or when sitting, a frequency of: - throughout the day Support Garment 20-30 mm/Hg pressure to: - patient to apply juxtalites daily to both legs over ace wraps Other: - ACE wrap both legs Electronic Signature(s) Signed: 03/23/2019 5:29:37 PM By: Worthy Keeler PA-C Signed: 03/23/2019 5:43:44 PM By: Baruch Gouty RN, BSN Entered By: Baruch Gouty on 03/23/2019  13:18:21 -------------------------------------------------------------------------------- Problem List Details Patient Name: Date of Service: Carlos White 03/23/2019 12:30 PM Medical Record [id number] Patient Account Number: [id number] Date  of Birth/Sex: Treating RN: October 22, 1940 (79 y.o. 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/30/2017 No Yes exposed L97.822 Non-pressure chronic ulcer of other part of left lower 09/30/2017 No Yes leg with fat layer exposed L97.812 Non-pressure chronic ulcer of other part of right lower 09/30/2017 No Yes leg with fat layer exposed [place] (primary) hypertension 09/30/2017 No Yes Inactive Problems Resolved Problems Electronic Signature(s) Signed: 03/23/2019 1:02:11 PM By: Worthy Keeler PA-C Entered By: Worthy Keeler on 03/23/2019 13:02:11 -------------------------------------------------------------------------------- Progress Note Details Patient Name: Date of Service: Carlos White 03/23/2019 12:30 PM Medical Record [id number] Patient Account Number: [id number] Date of Birth/Sex: Treating RN: 1940-10-23 (79 y.o. [name] Primary Care Provider: [name] Other Clinician: Referring Provider: Treating Provider/Extender:Stone III, Sharen Hint, MARY Weeks in Treatment: 5 Subjective Chief Complaint Information obtained from Patient Bilateral LE Ulcers History of Present Illness (HPI) 09/30/17 on evaluation today patient presents for initial evaluation and our clinic concerning issues that he has been having with  his bilateral lower extremities. He states this has been going on for quite some time at least six months. Currently his regiment has been mainly cleaning the area with peroxide, applying the is foreign ointment, and wrapping the area with ABD pads and then an ace wrap loosely. He has dealt with issues of this nature he tells me for quite some time. He does have a history of having had a compound fracture of the left lower extremity which he thinks also makes this a much more difficult area for him to heal. He's previously been told that he had poor vascular flow but this was years ago at [facility] we do not have any of those records at this time. He has a history of Epidermolysis Bullosa which was diagnosed around age 49 and he has been cared for at [facility] since that time. Subsequently he states this is hereditary and two of his children one male and one male also have this as well is one of his grandchildren that he is aware of. He has no evidence of infection necessarily at this point although he does have some necrotic tissue noted on the surface of the wound as far as the largest, left lateral lower extremity ulcer, is concerned. Overall I feel like all things considered he's been taking care of this very well. Obviously he has some fairly significant issues going on at this point in this regard. He does have a history otherwise of hypertension though for the most part other than the compound fracture of the left leg he seems to have been fairly healthy in my pinion. 10/07/17 on evaluation today patient actually appears to be doing better in regard to his bilateral lower extremity ulcers. With that being said he does still have some evidence of slough noted on the surface of the wounds I think the Iodoflex has been beneficial for him. His arterial studies are scheduled for October 2. With that being said I do believe that he is continuing to show signs of good improvement which is at least good  news. 10/14/17 on evaluation today patient appears to be doing very well in regard to his lower extremity ulcers. He's definitely made some progress  as far as healing is concerned although there still are several open areas that are going to need to be addressed. He did have his arterial study today which fortunately shows good findings with a right ABI of 1.23 with a TBI of 0.86 in the left ABI of 1.28 with a TBI of 0.81. This is good news and will allow [place] to perform debridement as well. 10/23/2017; patient with a large wound on the left lateral calf, sizable area on the left medial malleolus and an area on the right lateral malleolus. He has a new blister consistent with his underlying blistering skin disease just above this area we have been using Iodoflex on the lateral left calf lateral right ankle and collagen on the medial left ankle. We have been using Kerlix Coban wraps 10/28/17 on evaluation today the patient continues to have signs of improvement in regard to the overall appearance of the original wound. Unfortunately he did have some blistering over the right lateral lower extremity which has appeared to rupture on evaluation today and likely some of the dead tissue on the surface needs to be cleaned away the good news is this does not appear to be to significantly deep at this time. 11/04/17 on evaluation today patient actually appears to be doing a little better in regard to his lower extremity ulcers. He has been tolerating the dressing changes without complication. With that being said he does still have a significant wound especially over the left lateral lower extremity unfortunately. All of the wounds pretty much are going to require sharp debridement today. 11/11/17 on evaluation today patient appears to be doing more poorly in regard to his left lower extremity in particular. There does not appear to be any evidence of systemic infection although the wound itself as far as  the larger left lateral lower extremity ulcer actually appears to be infected in my pinion. There's an older and the surface of the wound is dramatically worsened compared to last week. No fevers, chills, nausea, or vomiting noted at this time. 11/18/17 upon evaluation today patient actually appears to be doing better. I did review his culture today which really did not show any specific organism is a positive reason for his wound decline. There are multiple organisms present not predominant. Nonetheless he seems to be tolerate the doxycycline well in his wounds in general do seem to be doing better. Fortunately there does not appear to be any evidence of infection at this time which is good news. Overall I'm very pleased with how things appear. Nonetheless he still has a lot of healing to go. I do think he could benefit from a Juxta-Lite wrap. 11/25/17 on evaluation today patient actually appears to be doing fairly well in regard to his wounds. He is still taking the antibiotics he has a few days left. Fortunately this seems to have been excellent for him as far as getting the infection control and very happy in this regard. With that being said the patient likewise is also very pleased with how things appear at this time in comparison to where we were he's not having as much pain. 12/02/17 Seen today for follow p and management of LLE wounds. Wounds appear to show some improvement. He denies pain, fever, or chills. Completed a course of doxycycline earlier this month. Scheduled to received Juxta-Lite wrap this week. No s/s of infections. 12/09/17 on evaluation today patient actually appears to be doing a little bit better in regard to his wounds. This  is obscene very slow process and unfortunately he has a couple of new areas and this is due to the Epidermolysis Bullosa. Nonetheless I am concerned about the fact that he seems to be getting more areas not less that is the reason we're gonna work  on getting schedule for the vascular referral to see the venous specialist. 12/23/17 upon evaluation today patient's wounds currently shows evidence of still not doing quite as well is what I would like to have seen. Subsequently the patient did have his venous study which showed evidence of venous stasis. Subsequently I do think that a vascular evaluation for consideration of venous intervention would be appropriate. I'm not necessarily suggesting that will be anything that can be done but I think it is at least a good idea. He is in agreement with this plan. 12/30/17 on evaluation today patient actually appears to be doing very well in regard to his wounds when compared to previous evaluation. Subsequently we have been using the [facility] Dressing which actually appears to have done excellent on his left lateral lower extremity ulcer. The quality of the wound surface is dramatically improved. There is some slight debridement that is going to be required at a couple of locations but overall I'm extremely pleased with how things appear here. 01/07/2018; this is a patient with a primary skin disorder epidermolyis bullosa. Is a large wound on the left lateral calf and smaller wounds on the right however there is a new wound on the right mid tibia area that occurred within the compression wrap that he did not change. We have been using Hydrofera Blue. On the left he is using Hydrofera Blue and Santyl to the inferior part of the wound and changing the dressing himself. 01/15/2018; primary skin disorder epidermolysis bullosa. He has several difficult wounds including the left lateral calf, smaller wounds on the left medial calf and the right lateral calf. The major area on the left lateral calf has a smaller area inferiorly that has necrotic debris we have been using Santyl to this. The rest of the wounds we have been using Hydrofera Blue. The area on the left calf actually looks larger this week.  Uncontrolled edema several small open areas above it that are superficial 01/20/18 on evaluation today patient appears to be doing better as compared to last week in regard to his wounds of the bilateral lower extremities. He tolerated the bilateral compression wrap without complication. Overall I'm very pleased with how things appear at this time. The patient likewise is very happy. 01/27/18 on evaluation today patient appears to be doing decently well in regard to his bilateral lower extremity ulcers. He's been tolerating the dressing changes without complication. One issue he had is that he did have more drainage to the left leg wrapped last week. He states in fact he probably should come in and let us change it on Friday however he just left it in place and kept adding extra absorption with ABD pads to the external portion of the wrap. Unfortunately he does have some aspiration type breakdown nothing significant but I do believe that this was probably counterproductive in general. Nonetheless his wounds do not appear to be terrible overall. 02/03/18 on evaluation today patient appears to be doing rather well in regard to his lower extremity ulcers. He has been tolerating the dressing changes without complication. He does tell me that he had to change the wrap on the left one since we last saw him. Subsequently I do  not see any evidence of infection I do feel like the food was much better controlled at this point. 02/10/18 on evaluation today patient appears to be doing rather well in regard to his ulcers. He still has significant alterations especially on the left lateral lower extremity. Fortunately there's no signs of infection at this time. Overall I feel like he is making good progress are some areas that I'm gonna attempt some debridement today. 02/17/18 on evaluation today patient appears to be doing okay in regard to his lower Trinity ulcer. It does appear on both locations he has a little bit  of drainage causing some breakdown in maceration around the wound bed's although it doesn't appear to be too bad the right is a little bit worse than left. Fortunately there's no signs of infection which is good news. No fevers, chills, nausea, or vomiting noted at this time. 03/10/18 on evaluation today patient actually appears to be doing rather poorly in regard to his bilateral lower extremity ulcers. The right in particular is draining profusely and the wound is actually enlarging which is not good. I'm concerned about both possibly infection and the fact that there's a lot of moisture which is causing breakdown as well. Unfortunately the patient has been trying to change this at home I'm afraid he may need to change more frequently in order to see the improvement that were looking for. There's no signs of systemic infection. 03/17/18 patient actually appears to be doing significantly better at this point in regard to his bilateral lower extremity ulcers. Fortunately there's no signs of infection. That is worsening infection at least indefinitely nothing systemic. With that being said he is having a lot of drainage though not quite as much is there in his last evaluation. Overall feel like he's on the side of improvement. I think if his results back from his culture which showed that he had a positive group B strep along with abundant Pseudomonas noted on the culture. For that reason I am gonna have him continue with the linezolid as we previously have ordered for him and I did go ahead as well today and prescribe Levaquin as well in order to treat the Pseudomonas portion of the infection noted. 03/24/18 on evaluation today patient actually appears to be doing very well in regard to his lower Trinity ulcer. He's been tolerating the dressing changes without complication. Fortunately both legs show signs of less drainage in his edema is very well controlled at this point as well. Overall very pleased  with how things seem to be progressing. 03/31/18 on evaluation today patient actually appears to be doing excellent in regard to his bilateral lower extremity ulcers. These are not draining nearly as significant as what they were in the past overall seem to be shown signs of excellent improvement which is great news. Fortunately there is no sign of active infection at this time I do believe that the Levaquin has done extremely well for him in this regard. The patient continues to change these at home typically every day. We may be able to slowly work towards every other day changes since the drainage seems to be slowing down quite significantly. 04/14/18 on evaluation today patient appears to be doing well in regard to his bilateral lower extremities. Let me [facility] Almost completely healed which is excellent news. Fortunately he's shown signs of improvement all other sites as well with new skin growth there's some slight hyper granular tissue but for the most part this seems  to be well maintained with the [facility] Dressing. I'm very happy in this regard. 04/28/18 on evaluation today patient appears to be doing rather well in regard to his ulcers of the bilateral lower extremities all things considering. He continues to make some progress as far as new skin growth. There still some hyper granulation noted at this point despite the use of the [facility] Dressing. This is not terrible but I think we may want to consider conclude cauterization today with silver nitrate to try to help knock some of his back as well as helping with any biofilm on the surface of the wound. 05/12/18 on evaluation today patient's wounds actually appear to be doing fairly well in regard to the bilateral lower extremities. He's been tolerating the dressing changes without complication. Fortunately there's no signs of active infection at this time which is good news. Overall very pleased with how things seem to be  progressing. You select silver nitrate was beneficial for him. 05/26/18 on evaluation today patient appears to be doing better in regard to left lower extremity and a little bit worse in regard to the right lower extremity. He states that he was pulling off the [facility] Dressing peel back some of the skin making this area significantly larger than what it was previous. He's not had any issues other than this and states even that hasn't caused any pain he just seems to obviously have a much larger area on the right when compared to the previous time I saw him. No fevers, chills, nausea, or vomiting noted at this time. 06/16/18 on evaluation today patient actually appears to be doing a little better in my pinion in regard to his lower summary ulcers. He has new skin islands that seem to be spreading which is good news. Fortunately there's no signs of active infection at this time. His biggest issue is he tells me that coming as often as he does is becoming very cost prohibitive. He wonders if we can potentially spread this out. 07/14/18 on evaluation today patient appears to be doing a little bit worse in regard to his lower from the ulcer. Unfortunately he still continues to have a significant amount of drainage I think we need to do something to try to help this more. He is still somewhat reluctant to go the route of the Wound VAC although that may be the most appropriate thing for him. No fevers, chills, nausea, or vomiting noted at this time. 08/18/2018 on evaluation today patient actually appears to be doing quite well with regard to his bilateral lower extremity ulcers. I do feel like that currently he is making great progress the care max does seem to be doing a great job at helping to control the moisture he has no maceration or skin breakdown. Again this seems to be an excellent way to go. 1 thing we may want to change is adding collagen to the base of the wound and then the care max over  top he is not opposed to this. 09/15/2018 on evaluation today patient appears to be doing well with regard to his bilateral lower extremity ulcers. He is showing some signs of improvement not necessarily in size but definitely in appearance. In fact he has a lot of new skin growing throughout the wounds along the edges as well as in the central portion of the wounds on both lower extremities. Overall I am extremely pleased to see this. 10/20/2018 on evaluation today patient actually appears to be  doing quite well with regard to his wounds. They are not measuring significantly smaller but he does have a lot of new epithelization noted as compared to previous. Fortunately there is no signs of active infection at this time. No fevers, chills, nausea, vomiting, or diarrhea. 11/17/2018 on evaluation today patient presents for reevaluation concerning his bilateral lower extremity ulcers. Fortunately there is no signs of active infection at this time today. He has been tolerating the dressing changes without complication. No fevers, chills, nausea, vomiting, or diarrhea. Unfortunately in general the patient has not made as much improvement as I would like to have seen up to this point. He has been tolerating the dressing changes without complication and he does an excellent job taking care of his wounds at home in my opinion. The biggest issue I see is that he is just not making the progress that we need to be seeing currently. I think we may want to consider having him seen at a plastic surgery appointment and he has previously seen someone in years past at [facility] in [place]. That is definitely a possibility for [place] to look into at this point. 12/29/2018 on evaluation today patient appears to be doing better in regard to the overall visual appearance of his wounds which do not appear to be as macerated. He does have a much larger skin island in the middle of the left lower extremity  ulcer which is doing much better. He tells me the pain is also significantly better. With that being said overall his improvement as far as the size of the wounds is not better but again these are very irregular in change shape quite often. Fortunately there is no evidence of active infection at this time which is great news. He never heard anything from [facility] regarding the plastic surgery referral that we made to them. 01/26/2019 upon evaluation today patient appears to be doing a little better in regard to his wounds today. He has been tolerating the dressing changes again he performs these for the most part on his own. He does a great job wrapping his legs in my opinion. Unfortunately he has not been able to get down to [facility] to see if there is anything from a plastic surgery standpoint that could be done to help with his legs simply due to the fact that his wife unfortunately sustained a compression fracture in her spine she is seeing Dr. Saintclair Halsted and subsequently is going to be having what sounds to be a kyphoplasty type procedure. With that being said that has not been scheduled yet there is still waiting on an MRI the patient is very busy in fact overly busy trying to help take care of his wife at this point. I completely understand this is more of a strain on him at this time 02/23/2019 upon evaluation today patient actually appears to be making some progress. I am actually very pleased with the overall appearance of his wounds even compared to last evaluation. He seems to be doing quite well. He is taking care of his wife unfortunately she did have a compression fracture she has had the procedure for this but still she has a slow road to recovery. For that reason he still not gone to [facility] for a second opinion in this regard. Obviously the goal there was if there was anything that can be done from a skin graft standpoint or otherwise. 03/23/2019 upon evaluation today patient continues to  have issues with lower extremity  ulcers. Since the beginning he has made progress but at the same time the wounds unfortunately just will not close. We have been trying to get the patient to [facility] to see a specialist there but unfortunately with the everything going on with his wife he has not been able to make that appointment time yet he states he may be able to in the next 1-2 months but is not really sure. Objective Constitutional Well-nourished and well-hydrated in no acute distress. Vitals Time Taken: 12:40 PM, Height: 71 in, Weight: 220 lbs, BMI: 30.7, Temperature: 97.8 F, Pulse: 69 bpm, Respiratory Rate: 18 breaths/min, Blood Pressure: 164/64 mmHg. Respiratory normal breathing without difficulty. Psychiatric this patient is able to make decisions and demonstrates good insight into disease process. Alert and Oriented x 3. pleasant and cooperative. General Notes: Patient's wound bed currently showed signs of good granulation at this time. Fortunately there is no signs of active infection which is great news. No fevers, chills, nausea, vomiting, or diarrhea. In general he seems to be doing quite nicely from the standpoint of infection. I see no signs or evidence of infection at this time. With that being said the patient does seem to be still having issues with the epidermolysis bullosa which I think is preventing healing. Integumentary (Hair, Skin) Wound #2 status is Open. Original cause of wound was Gradually Appeared. The wound is located on the Left,Distal,Lateral Lower Leg. The wound measures 13.9cm length x 13.5cm width x 0.1cm depth; 147.38cm^2 area and 14.738cm^3 volume. There is Fat Layer (Subcutaneous Tissue) Exposed exposed. There is no tunneling or undermining noted. There is a large amount of serosanguineous drainage noted. The wound margin is flat and intact. There is large (67-100%) red, hyper - granulation within the wound bed. There is a  small (1-33%) amount of necrotic tissue within the wound bed including Adherent Slough. Wound #5 status is Open. Original cause of wound was Gradually Appeared. The wound is located on the Right,Proximal,Lateral Lower Leg. The wound measures 10.4cm length x 14cm width x 0.1cm depth; 114.354cm^2 area and 11.435cm^3 volume. There is Fat Layer (Subcutaneous Tissue) Exposed exposed. There is no tunneling or undermining noted. There is a large amount of serosanguineous drainage noted. The wound margin is flat and intact. There is large (67-100%) red, hyper - granulation within the wound bed. There is a small (1-33%) amount of necrotic tissue within the wound bed including Adherent Slough. 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 Dressing Change Frequency: Wound #2 Left,Distal,Lateral Lower Leg: Change Dressing every other day. Wound #5 Right,Proximal,Lateral Lower Leg: Change Dressing every other day. Skin Barriers/Peri-Wound Care: Barrier cream - to periwound areas Moisturizing lotion - both legs with dressing changes Wound Cleansing: Wound #2 Left,Distal,Lateral Lower Leg: May shower and wash wound with soap and water. - with dressing changes Wound #5 Right,Proximal,Lateral Lower Leg: May shower and wash wound with soap and water. - with dressing changes Primary Wound Dressing: Wound #2 Left,Distal,Lateral Lower Leg: Collagen - fibracol - do not need to moisten Wound #5 Right,Proximal,Lateral Lower Leg: Collagen - fibracol - do not need to moisten Secondary Dressing: Wound #2 Left,Distal,Lateral Lower  Leg: Kerlix/Rolled Gauze ABD pad -  all wounds Zetuvit or Kerramax Wound #5 Right,Proximal,Lateral Lower Leg: Kerlix/Rolled Gauze ABD pad - all wounds Zetuvit or Kerramax Edema Control: Avoid standing for long periods of time Elevate legs to the level of the heart or above for 30 minutes daily and/or when sitting, a frequency of: - throughout the day Support Garment 20-30 mm/Hg pressure to: - patient to apply juxtalites daily to both legs over ace wraps Other: - ACE wrap both legs 1. My suggestion at this time is can be that we continue with the collagen as well as the Lauraine Rinne which does seem to be helping control some of the edema at this point. 2. I am also can recommend that we continue with the Fibracol if he can never really get this version of the medication followed by the ABD pads and Xtrasorb. 3. We will get a continue with the juxta lites over top of the rolled gauze to hold everything in place he is using Ace wraps as well to hold things in place then the juxta lites over top of this. 4. If he can ever get to the point where things get to [facility] for further evaluation and treatment I think that would be of benefit for him as well. We will see patient back for reevaluation in 1 week here in the clinic. If anything worsens or changes patient will contact our office for additional recommendations. Electronic Signature(s) Signed: 03/23/2019 1:28:24 PM By: Worthy Keeler PA-C Entered By: Worthy Keeler on 03/23/2019 [id number] -------------------------------------------------------------------------------- SuperBill Details Patient Name: Date of Service: Carlos White, Carlos White 03/23/2019 Medical Record [id number] Patient Account Number: [id number] Date of Birth/Sex: Treating RN: [birth year] (79 y.o. [name] Primary Care Provider: [name] Other Clinician: Referring Provider: Treating Provider/Extender:Stone III, Sharen Hint, MARY Weeks in Treatment:  37 Diagnosis Coding ICD-10 Codes Code Description I87.2 Venous insufficiency (chronic) (peripheral) Q81.8 Other epidermolysis bullosa L97.322 Non-pressure chronic ulcer of left ankle with fat layer exposed L97.822 Non-pressure chronic ulcer of other part of left lower leg with fat layer exposed L97.812 Non-pressure chronic ulcer of other part of right lower leg with fat layer exposed [place] (primary) hypertension Facility Procedures CPT4 Code: [id number] Description: 99214 - WOUND CARE VISIT-LEV 4 EST PT Modifier: Quantity: 1 Physician Procedures CPT4 Code Description: [id number] - WC PHYS LEVEL 3 - EST PT ICD-10 Diagnosis Description I87.2 Venous insufficiency (chronic) (peripheral) Q81.8 Other epidermolysis bullosa L97.322 Non-pressure chronic ulcer of left ankle with fat layer L97.822  Non-pressure chronic ulcer of other part of left lower l Modifier: exposed eg with fat layer Quantity: 1 exposed Electronic Signature(s) Signed: 03/23/2019 1:29:29 PM By: Worthy Keeler PA-C Entered By: Worthy Keeler on 03/23/2019 13:29:28

## 2019-03-24 DIAGNOSIS — I87312 Chronic venous hypertension (idiopathic) with ulcer of left lower extremity: Secondary | ICD-10-CM | POA: Diagnosis not present

## 2019-03-25 DIAGNOSIS — I87312 Chronic venous hypertension (idiopathic) with ulcer of left lower extremity: Secondary | ICD-10-CM | POA: Diagnosis not present

## 2019-03-28 NOTE — Progress Notes (Signed)
Carlos White, Carlos White ([id number]) Visit Report for 03/23/2019 Arrival Information Details Patient Name: Date of Service: Carlos White, Carlos White 03/23/2019 12:30 PM Medical Record [id number] Patient Account Number: [id number] Date of Birth/Sex: Treating RN: 04-05-1940 (79 y.o. Carlos White Primary Care Aedyn Mckeon: [name] Other Clinician: Referring Delon Revelo: Treating Vaniyah Lansky/Extender:Stone III, Sharen Hint, MARY Weeks in Treatment: 75 Visit Information History Since Last Visit Added or deleted any medications: No Patient Arrived: Ambulatory Any new allergies or adverse reactions: No Arrival Time: 12:34 Had a fall or experienced change in No Accompanied By: alone activities of daily living that may affect Transfer Assistance: None risk of falls: Patient Identification Verified: Yes Pain Present Now: No Secondary Verification Process Yes Completed: Patient Requires Transmission-Based No Precautions: Patient Has Alerts: Yes Patient Alerts: R ABI= 1.23, TBI = .86 L ABI= 1.28, TBI=.81 Electronic Signature(s) Signed: 03/28/2019 5:44:18 PM By: Levan Hurst RN, BSN Entered By: Levan Hurst on 03/23/2019 12:35:17 -------------------------------------------------------------------------------- Clinic Level of Care Assessment Details Patient Name: Date of Service: Carlos White 03/23/2019 12:30 PM Medical Record [id number] Patient Account Number: [id number] Date of Birth/Sex: Treating RN: 26-Feb-1940 (79 y.o. Carlos White Primary Care Carmine Carrozza: [name] Other Clinician: Referring Phala Schraeder: Treating Lecil Tapp/Extender:Stone III, Sharen Hint, MARY Weeks in Treatment: 66 Clinic Level of Care Assessment Items TOOL 4 Quantity Score []  - Use when only an EandM is performed on FOLLOW-UP visit 0 ASSESSMENTS - Nursing Assessment / Reassessment X - Reassessment of Co-morbidities (includes updates in patient status) 1 10 X - Reassessment of Adherence to Treatment  Plan 1 5 ASSESSMENTS - Wound and Skin Assessment / Reassessment []  - Simple Wound Assessment / Reassessment - one wound 0 X - Complex Wound Assessment / Reassessment - multiple wounds 2 5 []  - Dermatologic / Skin Assessment (not related to wound area) 0 ASSESSMENTS - Focused Assessment X - Circumferential Edema Measurements - multi extremities 2 5 []  - Nutritional Assessment / Counseling / Intervention 0 X - Lower Extremity Assessment (monofilament, tuning fork, pulses) 1 5 []  - Peripheral Arterial Disease Assessment (using hand held doppler) 0 ASSESSMENTS - Ostomy and/or Continence Assessment and Care []  - Incontinence Assessment and Management 0 []  - Ostomy Care Assessment and Management (repouching, etc.) 0 PROCESS - Coordination of Care X - Simple Patient / Family Education for ongoing care 1 15 []  - Complex (extensive) Patient / Family Education for ongoing care 0 X - Staff obtains Programmer, systems, Records, Test Results / Process Orders 1 10 []  - Staff telephones HHA, Nursing Homes / Clarify orders / etc 0 []  - Routine Transfer to another Facility (non-emergent condition) 0 []  - Routine Hospital Admission (non-emergent condition) 0 []  - New Admissions / Biomedical engineer / Ordering NPWT, Apligraf, etc. 0 []  - Emergency Hospital Admission (emergent condition) 0 X - Simple Discharge Coordination 1 10 []  - Complex (extensive) Discharge Coordination 0 PROCESS - Special Needs []  - Pediatric / Minor Patient Management 0 []  - Isolation Patient Management 0 []  - Hearing / Language / Visual special needs 0 []  - Assessment of Community assistance (transportation, D/C planning, etc.) 0 []  - Additional assistance / Altered mentation 0 []  - Support Surface(s) Assessment (bed, cushion, seat, etc.) 0 INTERVENTIONS - Wound Cleansing / Measurement []  - Simple Wound Cleansing - one wound 0 X - Complex Wound Cleansing - multiple wounds 2 5 X - Wound Imaging (photographs - any number of wounds) 1  5 []  - Wound Tracing (instead of photographs) 0 []  - Simple Wound Measurement - one wound 0  X - Complex Wound Measurement - multiple wounds 2 5 INTERVENTIONS - Wound Dressings []  - Small Wound Dressing one or multiple wounds 0 X - Medium Wound Dressing one or multiple wounds 2 15 []  - Large Wound Dressing one or multiple wounds 0 X - Application of Medications - topical 1 5 []  - Application of Medications - injection 0 INTERVENTIONS - Miscellaneous []  - External ear exam 0 []  - Specimen Collection (cultures, biopsies, blood, body fluids, etc.) 0 []  - Specimen(s) / Culture(s) sent or taken to Lab for analysis 0 []  - Patient Transfer (multiple staff / Civil Service fast streamer / Similar devices) 0 []  - Simple Staple / Suture removal (25 or less) 0 []  - Complex Staple / Suture removal (26 or more) 0 []  - Hypo / Hyperglycemic Management (close monitor of Blood Glucose) 0 []  - Ankle / Brachial Index (ABI) - do not check if billed separately 0 X - Vital Signs 1 5 Has the patient been seen at the hospital within the last three years: Yes Total Score: 140 Level Of Care: New/Established - Level 4 Electronic Signature(s) Signed: 03/23/2019 5:43:44 PM By: Baruch Gouty RN, BSN Entered By: Baruch Gouty on 03/23/2019 13:16:36 -------------------------------------------------------------------------------- Encounter Discharge Information Details Patient Name: Date of Service: Carlos White 03/23/2019 12:30 PM Medical Record [id number] Patient Account Number: [id number] Date of Birth/Sex: Treating RN: 03-07-1940 (78 y.o. Carlos White) Carlene Coria Primary Care Vernal Rutan: [name] Other Clinician: Referring Stassi Fadely: Treating Shriya Aker/Extender:Stone III, Sharen Hint, MARY Weeks in Treatment: 48 Encounter Discharge Information Items Discharge Condition: Stable Ambulatory Status: Ambulatory Discharge Destination: Home Transportation: Private Auto Accompanied By: self Schedule Follow-up Appointment:  Yes Clinical Summary of Care: Patient Declined Electronic Signature(s) Signed: 03/23/2019 5:35:23 PM By: Carlene Coria RN Entered By: Carlene Coria on 03/23/2019 13:40:25 -------------------------------------------------------------------------------- Lower Extremity Assessment Details Patient Name: Date of Service: Carlos White, Carlos White 03/23/2019 12:30 PM Medical Record [id number] Patient Account Number: [id number] Date of Birth/Sex: Treating RN: Feb 23, 1940 (78 y.o. Carlos White Primary Care Lashea Goda: [name] Other Clinician: Referring Bronislaus Verdell: Treating Elmire Amrein/Extender:Stone III, Sharen Hint, MARY Weeks in Treatment: 58 Edema Assessment Assessed: [Left: No] [Right: No] Edema: [Left: Yes] [Right: Yes] Calf Left: Right: Point of Measurement: 33 cm From Medial Instep 36 cm 38.8 cm Ankle Left: Right: Point of Measurement: 12 cm From Medial Instep 25.6 cm 23.5 cm Vascular Assessment Pulses: Dorsalis Pedis Palpable: [Left:Yes] [Right:Yes] Electronic Signature(s) Signed: 03/28/2019 5:44:18 PM By: Levan Hurst RN, BSN Entered By: Levan Hurst on 03/23/2019 12:48:02 -------------------------------------------------------------------------------- Multi-Disciplinary Care Plan Details Patient Name: Date of Service: Carlos White 03/23/2019 12:30 PM Medical Record [id number] Patient Account Number: [id number] Date of Birth/Sex: Treating RN: May 15, 1940 (79 y.o. Carlos White Primary Care Phillp Dolores: [name] Other Clinician: Referring Beronica Lansdale: Treating Davinia Riccardi/Extender:Stone III, Sharen Hint, MARY Weeks in Treatment: 83 Active Inactive Venous Leg Ulcer Nursing Diagnoses: Knowledge deficit related to disease process and management Potential for venous Insuffiency (use before diagnosis confirmed) Goals: Patient will maintain optimal edema control Date Initiated: 09/30/2017 Target Resolution Date: 04/20/2019 Goal Status: Active Patient/caregiver will  verbalize understanding of disease process and disease management Date Inactivated: 11/04/2017 Target Resolution Date Initiated: 09/30/2017 Date: 10/30/2017 Goal Status: Met Interventions: Assess peripheral edema status every visit. Provide education on venous insufficiency Notes: Wound/Skin Impairment Nursing Diagnoses: Impaired tissue integrity Knowledge deficit related to ulceration/compromised skin integrity Goals: Patient/caregiver will verbalize understanding of skin care regimen Date Initiated: 09/30/2017 Target Resolution Date: 04/20/2019 Goal Status: Active Ulcer/skin breakdown will have a volume reduction of 30% by week  4 Date Inactivated: 11/04/2017 Target10/18/2019 Resolution Date Initiated: 09/30/2017 Date: Goal Status: Met Interventions: Assess patient/caregiver ability to obtain necessary supplies Assess patient/caregiver ability to perform ulcer/skin care regimen upon admission and as needed Assess ulceration(s) every visit Provide education on ulcer and skin care Notes: Electronic Signature(s) Signed: 03/23/2019 5:43:44 PM By: Baruch Gouty RN, BSN Entered By: Baruch Gouty on 03/23/2019 13:15:08 -------------------------------------------------------------------------------- Pain Assessment Details Patient Name: Date of Service: Carlos White 03/23/2019 12:30 PM Medical Record [id number] Patient Account Number: [id number] Date of Birth/Sex: Treating RN: 1940-11-04 (79 y.o. Carlos White Primary Care Jazell Rosenau: [name] Other Clinician: Referring Mckinnley Smithey: Treating Darrelle Barrell/Extender:Stone III, Sharen Hint, MARY Weeks in Treatment: 17 Active Problems Location of Pain Severity and Description of Pain Patient Has Paino No Site Locations Pain Management and Medication Current Pain Management: Electronic Signature(s) Signed: 03/28/2019 5:44:18 PM By: Levan Hurst RN, BSN Entered By: Levan Hurst on 03/23/2019  12:35:51 -------------------------------------------------------------------------------- Patient/Caregiver Education Details Patient Name: Date of Service: Carlos White 3/10/2021andnbsp12:30 PM Medical Record [id number] Patient Account Number: [id number] Date of Birth/Gender: Treating RN: 05-Mar-1940 (78 y.o. Carlos White Primary Care Physician: [name] Other Clinician: Referring Physician: Treating Physician/Extender:Stone III, Sharen Hint, MARY Weeks in Treatment: 2 Education Assessment Education Provided To: Patient Education Topics Provided Venous: Methods: Explain/Verbal Responses: Reinforcements needed, State content correctly Wound/Skin Impairment: Methods: Explain/Verbal Responses: Reinforcements needed, State content correctly Electronic Signature(s) Signed: 03/23/2019 5:43:44 PM By: Baruch Gouty RN, BSN Entered By: Baruch Gouty on 03/23/2019 13:15:27 -------------------------------------------------------------------------------- Wound Assessment Details Patient Name: Date of Service: Carlos White 03/23/2019 12:30 PM Medical Record [id number] Patient Account Number: [id number] Date of Birth/Sex: Treating RN: 1940-02-04 (79 y.o. Carlos White Primary Care Nicholous Girgenti: [name] Other Clinician: Referring Caroline Longie: Treating Latarsha Zani/Extender:Stone III, Sharen Hint, MARY Weeks in Treatment: 18 Wound Status Wound Number: 2 Primary Venous Leg Ulcer Etiology: Wound Location: Left Lower Leg - Lateral, Distal Wound Open Wounding Event: Gradually Appeared Status: Date Acquired: 03/13/2017 Comorbid Cataracts, Chronic Obstructive Pulmonary Weeks Of Treatment: 77 History: Disease (COPD), Hypertension Clustered Wound: No Photos Wound Measurements Length: (cm) 13.9 % Reduct Width: (cm) 13.5 % Reduct Depth: (cm) 0.1 Epitheli Area: (cm) 147.38 Tunneli Volume: (cm) 14.738 Undermi Wound Description Classification: Full Thickness  Without Exposed Support Foul Od Structures Slough/ Wound Flat and Intact Margin: Exudate Large Amount: Exudate Serosanguineous Type: Exudate red, brown Color: Wound Bed Granulation Amount: Large (67-100%) Granulation Quality: Red, Hyper-granulation Fascia E Necrotic Amount: Small (1-33%) Fat Laye Necrotic Quality: Adherent Slough Tendon E Muscle E Joint Ex Bone Exp or After Cleansing: No Fibrino Yes Exposed Structure xposed: No r (Subcutaneous Tissue) Exposed: Yes xposed: No xposed: No posed: No osed: No ion in Area: -90.1% ion in Volume: 62% alization: Small (1-33%) ng: No ning: No Treatment Notes Wound #2 (Left, Distal, Lateral Lower Leg) 1. Cleanse With Wound Cleanser Soap and water 3. Primary Dressing Applied Other primary dressing (specifiy in notes) 4. Secondary Dressing ABD Pad Roll Gauze Kerramax/Xtrasorb 5. Secured With Elastic bandage Notes fibrocol Electronic Signature(s) Signed: 03/25/2019 4:47:30 PM By: Mikeal Hawthorne EMT/HBOT Signed: 03/25/2019 5:31:10 PM By: Baruch Gouty RN, BSN Entered By: Mikeal Hawthorne on 03/25/2019 13:18:52 -------------------------------------------------------------------------------- Wound Assessment Details Patient Name: Date of Service: Carlos White, Carlos White 03/23/2019 12:30 PM Medical Record [id number] Patient Account Number: [id number] Date of Birth/Sex: Treating RN: 06-Oct-1940 (79 y.o. Carlos White Primary Care Prestina Raigoza: [name] Other Clinician: Referring Cniyah Sproull: Treating Alanna Storti/Extender:Stone III, Sharen Hint, MARY Weeks in Treatment: 33 Wound Status Wound Number: 5 Primary Vasculopathy Etiology:  Wound Location: Right Lower Leg - Lateral, Proximal Wound Open Status: Wounding Event: Gradually Appeared Comorbid Cataracts, Chronic Obstructive Pulmonary Date Acquired: 10/26/2017 History: Disease (COPD), Hypertension Weeks Of Treatment: 73 Clustered Wound: No Photos Wound  Measurements Length: (cm) 10.4 % Reduc Width: (cm) 14 % Reduc Depth: (cm) 0.1 Epithel Area: (cm) 114.354 Tunnel Volume: (cm) 11.435 Underm Wound Description Classification: Full Thickness Without Exposed Support Foul O Structures Slough Wound Flat and Intact Margin: Exudate Large Amount: Exudate Serosanguineous Type: Exudate red, brown Color: Wound Bed Granulation Amount: Large (67-100%) Granulation Quality: Red, Hyper-granulation Fascia Necrotic Amount: Small (1-33%) Fat Layer Necrotic Quality: Adherent Slough Tendon Exp Muscle Exp Joint Expo Bone Expos dor After Cleansing: No /Fibrino Yes Exposed Structure Exposed: No (Subcutaneous Tissue) Exposed: Yes osed: No osed: No sed: No ed: No tion in Area: -110.6% tion in Volume: -110.6% ialization: Small (1-33%) ing: No ining: No Treatment Notes Wound #5 (Right, Proximal, Lateral Lower Leg) 1. Cleanse With Wound Cleanser Soap and water 3. Primary Dressing Applied Other primary dressing (specifiy in notes) 4. Secondary Dressing ABD Pad Roll Gauze Kerramax/Xtrasorb 5. Secured With Elastic bandage Notes fibrocol Electronic Signature(s) Signed: 03/25/2019 4:47:30 PM By: Mikeal Hawthorne EMT/HBOT Signed: 03/25/2019 5:31:10 PM By: Baruch Gouty RN, BSN Entered By: Mikeal Hawthorne on 03/25/2019 13:19:13 -------------------------------------------------------------------------------- Vitals Details Patient Name: Date of Service: Carlos White 03/23/2019 12:30 PM Medical Record [id number] Patient Account Number: [id number] Date of Birth/Sex: Treating RN: May 26, 1940 (79 y.o. Carlos White Primary Care Caylor Tallarico: [name] Other Clinician: Referring Natasja Niday: Treating Jamier Urbas/Extender:Stone III, Sharen Hint, MARY Weeks in Treatment: 70 Vital Signs Time Taken: 12:40 Temperature (F): 97.8 Height (in): 71 Pulse (bpm): 69 Weight (lbs): 220 Respiratory Rate (breaths/min): 18 Body Mass Index  (BMI): 30.7 Blood Pressure (mmHg): 164/64 Reference Range: 80 - 120 mg / dl Electronic Signature(s) Signed: 03/28/2019 5:44:18 PM By: Levan Hurst RN, BSN Entered By: Levan Hurst on 03/23/2019 12:40:36

## 2019-03-31 DIAGNOSIS — I87312 Chronic venous hypertension (idiopathic) with ulcer of left lower extremity: Secondary | ICD-10-CM | POA: Diagnosis not present

## 2019-04-02 ENCOUNTER — Other Ambulatory Visit: Payer: Self-pay | Admitting: Internal Medicine

## 2019-04-20 ENCOUNTER — Encounter (HOSPITAL_BASED_OUTPATIENT_CLINIC_OR_DEPARTMENT_OTHER): Payer: Medicare PPO | Admitting: Physician Assistant

## 2019-04-27 ENCOUNTER — Other Ambulatory Visit (HOSPITAL_COMMUNITY)
Admission: RE | Admit: 2019-04-27 | Discharge: 2019-04-27 | Disposition: A | Discharge status: Home / Self Care | Payer: Medicare PPO | Source: Other Acute Inpatient Hospital | Attending: Physician Assistant | Admitting: Physician Assistant

## 2019-04-27 ENCOUNTER — Other Ambulatory Visit: Payer: Self-pay

## 2019-04-27 ENCOUNTER — Encounter (HOSPITAL_BASED_OUTPATIENT_CLINIC_OR_DEPARTMENT_OTHER): Payer: Medicare PPO | Attending: Physician Assistant | Admitting: Physician Assistant

## 2019-04-27 DIAGNOSIS — L97812 Non-pressure chronic ulcer of other part of right lower leg with fat layer exposed: Secondary | ICD-10-CM | POA: Diagnosis not present

## 2019-04-27 DIAGNOSIS — I872 Venous insufficiency (chronic) (peripheral): Secondary | ICD-10-CM | POA: Insufficient documentation

## 2019-04-27 DIAGNOSIS — R609 Edema, unspecified: Secondary | ICD-10-CM | POA: Insufficient documentation

## 2019-04-27 DIAGNOSIS — L97322 Non-pressure chronic ulcer of left ankle with fat layer exposed: Secondary | ICD-10-CM | POA: Diagnosis not present

## 2019-04-27 DIAGNOSIS — I10 Essential (primary) hypertension: Secondary | ICD-10-CM | POA: Insufficient documentation

## 2019-04-27 DIAGNOSIS — Q819 Epidermolysis bullosa, unspecified: Secondary | ICD-10-CM | POA: Diagnosis not present

## 2019-04-27 DIAGNOSIS — L03116 Cellulitis of left lower limb: Secondary | ICD-10-CM | POA: Insufficient documentation

## 2019-04-27 DIAGNOSIS — L89322 Pressure ulcer of left buttock, stage 2: Secondary | ICD-10-CM | POA: Diagnosis not present

## 2019-04-27 DIAGNOSIS — L97822 Non-pressure chronic ulcer of other part of left lower leg with fat layer exposed: Secondary | ICD-10-CM | POA: Insufficient documentation

## 2019-04-27 NOTE — Progress Notes (Addendum)
JEREN, SWETT ([id number]) Visit Report for 04/27/2019 Chief Complaint Document Details Patient Name: Date of Service: JEMAINE, ZISK 04/27/2019 12:30 PM Medical Record [id number] Patient Account Number: [id number] Date of Birth/Sex: Treating RN: 01/12/1941 (79 y.o. [name] Primary Care Provider: [name] Other Clinician: Referring Provider: Treating Provider/Extender:Stone III, Sharen Hint, MARY Weeks in Treatment: 22 Information Obtained from: Patient Chief Complaint Bilateral LE Ulcers Electronic Signature(s) Signed: 04/27/2019 1:06:37 PM By: Worthy Keeler PA-C Entered By: Worthy Keeler on 04/27/2019 13:06:37 -------------------------------------------------------------------------------- HPI Details Patient Name: Date of Service: [name] 04/27/2019 12:30 PM Medical Record [id number] Patient Account Number: [id number] Date of Birth/Sex: Treating RN: [birth year] (79 y.o. [name] Primary Care Provider: [name] Other Clinician: Referring Provider: Treating Provider/Extender:Stone III, Sharen Hint, MARY Weeks in Treatment: 60 History of Present Illness HPI Description: 09/30/17 on evaluation today patient presents for initial evaluation and our clinic concerning issues that he has been having with his bilateral lower extremities. He states this has been going on for quite some time at least six months. Currently his regiment has been mainly cleaning the area with peroxide, applying the is foreign ointment, and wrapping the area with ABD pads and then an ace wrap loosely. He has dealt with issues of this nature he tells me for quite some time. He does have a history of having had a compound fracture of the left lower extremity which he thinks also makes this a much more difficult area for him to heal. He's previously been told that he had poor vascular flow but this was years ago at [facility] we do not have any of those records at  this time. He has a history of Epidermolysis Bullosa which was diagnosed around age 12 and he has been cared for at [facility] since that time. Subsequently he states this is hereditary and two of his children one male and one male also have this as well is one of his grandchildren that he is aware of. He has no evidence of infection necessarily at this point although he does have some necrotic tissue noted on the surface of the wound as far as the largest, left lateral lower extremity ulcer, is concerned. Overall I feel like all things considered he's been taking care of this very well. Obviously he has some fairly significant issues going on at this point in this regard. He does have a history otherwise of hypertension though for the most part other than the compound fracture of the left leg he seems to have been fairly healthy in my pinion. 10/07/17 on evaluation today patient actually appears to be doing better in regard to his bilateral lower extremity ulcers. With that being said he does still have some evidence of slough noted on the surface of the wounds I think the Iodoflex has been beneficial for him. His arterial studies are scheduled for October 2. With that being said I do believe that he is continuing to show signs of good improvement which is at least good news. 10/14/17 on evaluation today patient appears to be doing very well in regard to his lower extremity ulcers. He's definitely made some progress as far as healing is concerned although there still are several open areas that are going to need to be addressed. He did have his arterial study today which fortunately shows good findings with a right ABI of 1.23 with a TBI of 0.86 in the left ABI of 1.28 with a TBI of 0.81. This is good  news and will allow [place] to perform debridement as well. 10/23/2017; patient with a large wound on the left lateral calf, sizable area on the left medial malleolus and an area on the right lateral malleolus.  He has a new blister consistent with his underlying blistering skin disease just above this area we have been using Iodoflex on the lateral left calf lateral right ankle and collagen on the medial left ankle. We have been using Kerlix Coban wraps 10/28/17 on evaluation today the patient continues to have signs of improvement in regard to the overall appearance of the original wound. Unfortunately he did have some blistering over the right lateral lower extremity which has appeared to rupture on evaluation today and likely some of the dead tissue on the surface needs to be cleaned away the good news is this does not appear to be to significantly deep at this time. 11/04/17 on evaluation today patient actually appears to be doing a little better in regard to his lower extremity ulcers. He has been tolerating the dressing changes without complication. With that being said he does still have a significant wound especially over the left lateral lower extremity unfortunately. All of the wounds pretty much are going to require sharp debridement today. 11/11/17 on evaluation today patient appears to be doing more poorly in regard to his left lower extremity in particular. There does not appear to be any evidence of systemic infection although the wound itself as far as the larger left lateral lower extremity ulcer actually appears to be infected in my pinion. There's an older and the surface of the wound is dramatically worsened compared to last week. No fevers, chills, nausea, or vomiting noted at this time. 11/18/17 upon evaluation today patient actually appears to be doing better. I did review his culture today which really did not show any specific organism is a positive reason for his wound decline. There are multiple organisms present not predominant. Nonetheless he seems to be tolerate the doxycycline well in his wounds in general do seem to be doing better. Fortunately there does not appear to be  any evidence of infection at this time which is good news. Overall I'm very pleased with how things appear. Nonetheless he still has a lot of healing to go. I do think he could benefit from a Juxta-Lite wrap. 11/25/17 on evaluation today patient actually appears to be doing fairly well in regard to his wounds. He is still taking the antibiotics he has a few days left. Fortunately this seems to have been excellent for him as far as getting the infection control and very happy in this regard. With that being said the patient likewise is also very pleased with how things appear at this time in comparison to where we were he's not having as much pain. 12/02/17 Seen today for follow p and management of LLE wounds. Wounds appear to show some improvement. He denies pain, fever, or chills. Completed a course of doxycycline earlier this month. Scheduled to received Juxta-Lite wrap this week. No s/s of infections. 12/09/17 on evaluation today patient actually appears to be doing a little bit better in regard to his wounds. This is obscene very slow process and unfortunately he has a couple of new areas and this is due to the Epidermolysis Bullosa. Nonetheless I am concerned about the fact that he seems to be getting more areas not less that is the reason we're gonna work on getting schedule for the vascular referral to see the venous  specialist. 12/23/17 upon evaluation today patient's wounds currently shows evidence of still not doing quite as well is what I would like to have seen. Subsequently the patient did have his venous study which showed evidence of venous stasis. Subsequently I do think that a vascular evaluation for consideration of venous intervention would be appropriate. I'm not necessarily suggesting that will be anything that can be done but I think it is at least a good idea. He is in agreement with this plan. 12/30/17 on evaluation today patient actually appears to be doing very well in  regard to his wounds when compared to previous evaluation. Subsequently we have been using the [facility] Dressing which actually appears to have done excellent on his left lateral lower extremity ulcer. The quality of the wound surface is dramatically improved. There is some slight debridement that is going to be required at a couple of locations but overall I'm extremely pleased with how things appear here. 01/07/2018; this is a patient with a primary skin disorder epidermolyis bullosa. Is a large wound on the left lateral calf and smaller wounds on the right however there is a new wound on the right mid tibia area that occurred within the compression wrap that he did not change. We have been using Hydrofera Blue. On the left he is using Hydrofera Blue and Santyl to the inferior part of the wound and changing the dressing himself. 01/15/2018; primary skin disorder epidermolysis bullosa. He has several difficult wounds including the left lateral calf, smaller wounds on the left medial calf and the right lateral calf. The major area on the left lateral calf has a smaller area inferiorly that has necrotic debris we have been using Santyl to this. The rest of the wounds we have been using Hydrofera Blue. The area on the left calf actually looks larger this week. Uncontrolled edema several small open areas above it that are superficial 01/20/18 on evaluation today patient appears to be doing better as compared to last week in regard to his wounds of the bilateral lower extremities. He tolerated the bilateral compression wrap without complication. Overall I'm very pleased with how things appear at this time. The patient likewise is very happy. 01/27/18 on evaluation today patient appears to be doing decently well in regard to his bilateral lower extremity ulcers. He's been tolerating the dressing changes without complication. One issue he had is that he did have more drainage to the left leg wrapped  last week. He states in fact he probably should come in and let us change it on Friday however he just left it in place and kept adding extra absorption with ABD pads to the external portion of the wrap. Unfortunately he does have some aspiration type breakdown nothing significant but I do believe that this was probably counterproductive in general. Nonetheless his wounds do not appear to be terrible overall. 02/03/18 on evaluation today patient appears to be doing rather well in regard to his lower extremity ulcers. He has been tolerating the dressing changes without complication. He does tell me that he had to change the wrap on the left one since we last saw him. Subsequently I do not see any evidence of infection I do feel like the food was much better controlled at this point. 02/10/18 on evaluation today patient appears to be doing rather well in regard to his ulcers. He still has significant alterations especially on the left lateral lower extremity. Fortunately there's no signs of infection at this time. Overall  I feel like he is making good progress are some areas that I'm gonna attempt some debridement today. 02/17/18 on evaluation today patient appears to be doing okay in regard to his lower Trinity ulcer. It does appear on both locations he has a little bit of drainage causing some breakdown in maceration around the wound bed's although it doesn't appear to be too bad the right is a little bit worse than left. Fortunately there's no signs of infection which is good news. No fevers, chills, nausea, or vomiting noted at this time. 03/10/18 on evaluation today patient actually appears to be doing rather poorly in regard to his bilateral lower extremity ulcers. The right in particular is draining profusely and the wound is actually enlarging which is not good. I'm concerned about both possibly infection and the fact that there's a lot of moisture which is causing breakdown as well. Unfortunately  the patient has been trying to change this at home I'm afraid he may need to change more frequently in order to see the improvement that were looking for. There's no signs of systemic infection. 03/17/18 patient actually appears to be doing significantly better at this point in regard to his bilateral lower extremity ulcers. Fortunately there's no signs of infection. That is worsening infection at least indefinitely nothing systemic. With that being said he is having a lot of drainage though not quite as much is there in his last evaluation. Overall feel like he's on the side of improvement. I think if his results back from his culture which showed that he had a positive group B strep along with abundant Pseudomonas noted on the culture. For that reason I am gonna have him continue with the linezolid as we previously have ordered for him and I did go ahead as well today and prescribe Levaquin as well in order to treat the Pseudomonas portion of the infection noted. 03/24/18 on evaluation today patient actually appears to be doing very well in regard to his lower Trinity ulcer. He's been tolerating the dressing changes without complication. Fortunately both legs show signs of less drainage in his edema is very well controlled at this point as well. Overall very pleased with how things seem to be progressing. 03/31/18 on evaluation today patient actually appears to be doing excellent in regard to his bilateral lower extremity ulcers. These are not draining nearly as significant as what they were in the past overall seem to be shown signs of excellent improvement which is great news. Fortunately there is no sign of active infection at this time I do believe that the Levaquin has done extremely well for him in this regard. The patient continues to change these at home typically every day. We may be able to slowly work towards every other day changes since the drainage seems to be slowing down quite  significantly. 04/14/18 on evaluation today patient appears to be doing well in regard to his bilateral lower extremities. Let me Hilda Blades Almost completely healed which is excellent news. Fortunately he's shown signs of improvement all other sites as well with new skin growth there's some slight hyper granular tissue but for the most part this seems to be well maintained with the [facility] Dressing. I'm very happy in this regard. 04/28/18 on evaluation today patient appears to be doing rather well in regard to his ulcers of the bilateral lower extremities all things considering. He continues to make some progress as far as new skin growth. There still some hyper granulation noted  at this point despite the use of the [facility] Dressing. This is not terrible but I think we may want to consider conclude cauterization today with silver nitrate to try to help knock some of his back as well as helping with any biofilm on the surface of the wound. 05/12/18 on evaluation today patient's wounds actually appear to be doing fairly well in regard to the bilateral lower extremities. He's been tolerating the dressing changes without complication. Fortunately there's no signs of active infection at this time which is good news. Overall very pleased with how things seem to be progressing. You select silver nitrate was beneficial for him. 05/26/18 on evaluation today patient appears to be doing better in regard to left lower extremity and a little bit worse in regard to the right lower extremity. He states that he was pulling off the [facility] Dressing peel back some of the skin making this area significantly larger than what it was previous. He's not had any issues other than this and states even that hasn't caused any pain he just seems to obviously have a much larger area on the right when compared to the previous time I saw him. No fevers, chills, nausea, or vomiting noted at this time. 06/16/18 on  evaluation today patient actually appears to be doing a little better in my pinion in regard to his lower summary ulcers. He has new skin islands that seem to be spreading which is good news. Fortunately there's no signs of active infection at this time. His biggest issue is he tells me that coming as often as he does is becoming very cost prohibitive. He wonders if we can potentially spread this out. 07/14/18 on evaluation today patient appears to be doing a little bit worse in regard to his lower from the ulcer. Unfortunately he still continues to have a significant amount of drainage I think we need to do something to try to help this more. He is still somewhat reluctant to go the route of the Wound VAC although that may be the most appropriate thing for him. No fevers, chills, nausea, or vomiting noted at this time. 08/18/2018 on evaluation today patient actually appears to be doing quite well with regard to his bilateral lower extremity ulcers. I do feel like that currently he is making great progress the care max does seem to be doing a great job at helping to control the moisture he has no maceration or skin breakdown. Again this seems to be an excellent way to go. 1 thing we may want to change is adding collagen to the base of the wound and then the care max over top he is not opposed to this. 09/15/2018 on evaluation today patient appears to be doing well with regard to his bilateral lower extremity ulcers. He is showing some signs of improvement not necessarily in size but definitely in appearance. In fact he has a lot of new skin growing throughout the wounds along the edges as well as in the central portion of the wounds on both lower extremities. Overall I am extremely pleased to see this. 10/20/2018 on evaluation today patient actually appears to be doing quite well with regard to his wounds. They are not measuring significantly smaller but he does have a lot of new epithelization noted as  compared to previous. Fortunately there is no signs of active infection at this time. No fevers, chills, nausea, vomiting, or diarrhea. 11/17/2018 on evaluation today patient presents for reevaluation concerning his bilateral  lower extremity ulcers. Fortunately there is no signs of active infection at this time today. He has been tolerating the dressing changes without complication. No fevers, chills, nausea, vomiting, or diarrhea. Unfortunately in general the patient has not made as much improvement as I would like to have seen up to this point. He has been tolerating the dressing changes without complication and he does an excellent job taking care of his wounds at home in my opinion. The biggest issue I see is that he is just not making the progress that we need to be seeing currently. I think we may want to consider having him seen at a plastic surgery appointment and he has previously seen someone in years past at [facility] in [place]. That is definitely a possibility for [place] to look into at this point. 12/29/2018 on evaluation today patient appears to be doing better in regard to the overall visual appearance of his wounds which do not appear to be as macerated. He does have a much larger skin island in the middle of the left lower extremity ulcer which is doing much better. He tells me the pain is also significantly better. With that being said overall his improvement as far as the size of the wounds is not better but again these are very irregular in change shape quite often. Fortunately there is no evidence of active infection at this time which is great news. He never heard anything from [facility] regarding the plastic surgery referral that we made to them. 01/26/2019 upon evaluation today patient appears to be doing a little better in regard to his wounds today. He has been tolerating the dressing changes again he performs these for the most part on his own. He does a  great job wrapping his legs in my opinion. Unfortunately he has not been able to get down to [facility] to see if there is anything from a plastic surgery standpoint that could be done to help with his legs simply due to the fact that his wife unfortunately sustained a compression fracture in her spine she is seeing Dr. Saintclair Halsted and subsequently is going to be having what sounds to be a kyphoplasty type procedure. With that being said that has not been scheduled yet there is still waiting on an MRI the patient is very busy in fact overly busy trying to help take care of his wife at this point. I completely understand this is more of a strain on him at this time 02/23/2019 upon evaluation today patient actually appears to be making some progress. I am actually very pleased with the overall appearance of his wounds even compared to last evaluation. He seems to be doing quite well. He is taking care of his wife unfortunately she did have a compression fracture she has had the procedure for this but still she has a slow road to recovery. For that reason he still not gone to [facility] for a second opinion in this regard. Obviously the goal there was if there was anything that can be done from a skin graft standpoint or otherwise. 03/23/2019 upon evaluation today patient continues to have issues with lower extremity ulcers. Since the beginning he has made progress but at the same time the wounds unfortunately just will not close. We have been trying to get the patient to [facility] to see a specialist there but unfortunately with the everything going on with his wife he has not been able to make that appointment  time yet he states he may be able to in the next 1-2 months but is not really sure. 04/27/2019 on evaluation today patient appears to be doing a little bit more poorly. His last evaluation. He appears to have some erythema around the edges of the wound at this point. Fortunately there  is no signs of active infection at this time which is good news. No fevers, chills, nausea, vomiting, or diarrhea. Electronic Signature(s) Signed: 04/27/2019 1:29:31 PM By: Worthy Keeler PA-C Entered By: Worthy Keeler on 04/27/2019 13:29:31 -------------------------------------------------------------------------------- Physical Exam Details Patient Name: Date of Service: ELKAN, LESSMAN 04/27/2019 12:30 PM Medical Record [id number] Patient Account Number: [id number] Date of Birth/Sex: Treating RN: 10/20/1940 (79 y.o. [name] Primary Care Provider: [name] Other Clinician: Referring Provider: Treating Provider/Extender:Stone III, Sharen Hint, MARY Weeks in Treatment: 46 Constitutional Well-nourished and well-hydrated in no acute distress. Respiratory normal breathing without difficulty. Psychiatric this patient is able to make decisions and demonstrates good insight into disease process. Alert and Oriented x 3. pleasant and cooperative. Notes Upon inspection patient's wound unfortunately is showing signs of erythema surrounding is also measuring larger this does have me concerned about the possibility of infection just based on what I am seeing. The good news is he has gotten the appropriate collagen but unfortunately just does not seem to be doing as well as I would like at this point. I did obtain a wound culture today. Electronic Signature(s) Signed: 04/27/2019 1:29:51 PM By: Worthy Keeler PA-C Entered By: Worthy Keeler on 04/27/2019 13:29:50 -------------------------------------------------------------------------------- Physician Orders Details Patient Name: Date of Service: AYUSHMAN, SZOSTAK 04/27/2019 12:30 PM Medical Record [id number] Patient Account Number: [id number] Date of Birth/Sex: Treating RN: [birth year] (79 y.o. [name] Primary Care Provider: [name] Other Clinician: Referring Provider: Treating  Provider/Extender:Stone III, Sharen Hint, MARY Weeks in Treatment: 76 Verbal / Phone Orders: No Diagnosis Coding ICD-10 Coding Code Description I87.2 Venous insufficiency (chronic) (peripheral) Q81.8 Other epidermolysis bullosa L97.322 Non-pressure chronic ulcer of left ankle with fat layer exposed L97.822 Non-pressure chronic ulcer of other part of left lower leg with fat layer exposed L97.812 Non-pressure chronic ulcer of other part of right lower leg with fat layer exposed [place] (primary) hypertension Follow-up Appointments Return appointment in 1 month. Dressing Change Frequency Wound #2 Left,Distal,Lateral Lower Leg Change Dressing every other day. Wound #5 Right,Proximal,Lateral Lower Leg Change Dressing every other day. Skin Barriers/Peri-Wound Care Barrier cream - to periwound areas Moisturizing lotion - both legs with dressing changes Wound Cleansing Wound #2 Left,Distal,Lateral Lower Leg May shower and wash wound with soap and water. - with dressing changes Wound #5 Right,Proximal,Lateral Lower Leg May shower and wash wound with soap and water. - with dressing changes Primary Wound Dressing Wound #2 Left,Distal,Lateral Lower Leg Collagen - fibracol - do not need to moisten Wound #5 Right,Proximal,Lateral Lower Leg Collagen - fibracol - do not need to moisten Secondary Dressing Wound #2 Left,Distal,Lateral Lower Leg Kerlix/Rolled Gauze ABD pad - all wounds Zetuvit or Kerramax Wound #5 Right,Proximal,Lateral Lower Leg Kerlix/Rolled Gauze ABD pad - all wounds Zetuvit or Kerramax Edema Control Avoid standing for long periods of time Elevate legs to the level of the heart or above for 30 minutes daily and/or when sitting, a frequency of: - throughout the day Support Garment 20-30 mm/Hg pressure to: - patient to apply juxtalites daily to both legs over ace wraps daily Other: - ACE wrap both legs Laboratory Bacteria identified in Unspecified specimen by  Anaerobe culture (MICRO) - left lower leg LOINC Code: [id number] Convenience Name: Anerobic culture Electronic Signature(s) Signed: 04/27/2019 4:44:47 PM By: Worthy Keeler PA-C Signed: 04/27/2019 5:43:24 PM By: Baruch Gouty RN, BSN Previous Signature: 04/27/2019 1:31:30 PM Version By: Worthy Keeler PA-C Entered By: Baruch Gouty on 04/27/2019 13:31:50 -------------------------------------------------------------------------------- Problem List Details Patient Name: Date of Service: [name] 04/27/2019 12:30 PM Medical Record [id number] Patient Account Number: [id number] Date of Birth/Sex: Treating RN: 1940-08-28 (79 y.o. 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 [name] Primary Care Provider: [name] Other  Clinician: Referring Provider: Treating Provider/Extender:Stone III, Sharen Hint, MARY Weeks in Treatment: 83 Subjective Chief Complaint Information obtained from Patient Bilateral LE Ulcers History of Present Illness (HPI) 09/30/17 on evaluation today patient presents for initial evaluation and our clinic concerning issues that he has been having with his bilateral lower extremities. He states this has been going on for quite some time at least six months. Currently his regiment has been mainly cleaning the area with peroxide, applying the is foreign ointment, and wrapping the area with ABD pads and then an ace wrap loosely. He has dealt with issues of this nature he tells me for quite some time. He does have a history of having had a compound fracture of the left lower extremity which he thinks also makes this a much more difficult area for him to heal. He's previously been told that he had poor vascular flow but this was years ago at [facility] we do not have any of those records at this time. He has a history of Epidermolysis Bullosa which was diagnosed around age 68 and he has been cared for at [facility] since that time. Subsequently he states this is hereditary and two of his children one male and one male also have this as well is one of his grandchildren that he is aware of. He has no evidence of infection necessarily at this point although he does have some necrotic tissue noted on the surface of the wound as far as the largest, left lateral lower extremity ulcer, is concerned. Overall I feel like all things considered he's been taking care of this very well. Obviously he has some fairly significant issues going on at this point in this regard. He does have a history otherwise of hypertension though for the most part other than the compound fracture of the left leg he seems to have been fairly healthy in my pinion. 10/07/17 on evaluation today patient actually appears to be doing better in  regard to his bilateral lower extremity ulcers. With that being said he does still  have some evidence of slough noted on the surface of the wounds I think the Iodoflex has been beneficial for him. His arterial studies are scheduled for October 2. With that being said I do believe that he is continuing to show signs of good improvement which is at least good news. 10/14/17 on evaluation today patient appears to be doing very well in regard to his lower extremity ulcers. He's definitely made some progress as far as healing is concerned although there still are several open areas that are going to need to be addressed. He did have his arterial study today which fortunately shows good findings with a right ABI of 1.23 with a TBI of 0.86 in the left ABI of 1.28 with a TBI of 0.81. This is good news and will allow [place] to perform debridement as well. 10/23/2017; patient with a large wound on the left lateral calf, sizable area on the left medial malleolus and an area on the right lateral malleolus. He has a new blister consistent with his underlying blistering skin disease just above this area we have been using Iodoflex on the lateral left calf lateral right ankle and collagen on the medial left ankle. We have been using Kerlix Coban wraps 10/28/17 on evaluation today the patient continues to have signs of improvement in regard to the overall appearance of the original wound. Unfortunately he did have some blistering over the right lateral lower extremity which has appeared to rupture on evaluation today and likely some of the dead tissue on the surface needs to be cleaned away the good news is this does not appear to be to significantly deep at this time. 11/04/17 on evaluation today patient actually appears to be doing a little better in regard to his lower extremity ulcers. He has been tolerating the dressing changes without complication. With that being said he does still have a significant wound  especially over the left lateral lower extremity unfortunately. All of the wounds pretty much are going to require sharp debridement today. 11/11/17 on evaluation today patient appears to be doing more poorly in regard to his left lower extremity in particular. There does not appear to be any evidence of systemic infection although the wound itself as far as the larger left lateral lower extremity ulcer actually appears to be infected in my pinion. There's an older and the surface of the wound is dramatically worsened compared to last week. No fevers, chills, nausea, or vomiting noted at this time. 11/18/17 upon evaluation today patient actually appears to be doing better. I did review his culture today which really did not show any specific organism is a positive reason for his wound decline. There are multiple organisms present not predominant. Nonetheless he seems to be tolerate the doxycycline well in his wounds in general do seem to be doing better. Fortunately there does not appear to be any evidence of infection at this time which is good news. Overall I'm very pleased with how things appear. Nonetheless he still has a lot of healing to go. I do think he could benefit from a Juxta-Lite wrap. 11/25/17 on evaluation today patient actually appears to be doing fairly well in regard to his wounds. He is still taking the antibiotics he has a few days left. Fortunately this seems to have been excellent for him as far as getting the infection control and very happy in this regard. With that being said the patient likewise is also very pleased with how things appear at this  time in comparison to where we were he's not having as much pain. 12/02/17 Seen today for follow p and management of LLE wounds. Wounds appear to show some improvement. He denies pain, fever, or chills. Completed a course of doxycycline earlier this month. Scheduled to received Juxta-Lite wrap this week. No s/s of  infections. 12/09/17 on evaluation today patient actually appears to be doing a little bit better in regard to his wounds. This is obscene very slow process and unfortunately he has a couple of new areas and this is due to the Epidermolysis Bullosa. Nonetheless I am concerned about the fact that he seems to be getting more areas not less that is the reason we're gonna work on getting schedule for the vascular referral to see the venous specialist. 12/23/17 upon evaluation today patient's wounds currently shows evidence of still not doing quite as well is what I would like to have seen. Subsequently the patient did have his venous study which showed evidence of venous stasis. Subsequently I do think that a vascular evaluation for consideration of venous intervention would be appropriate. I'm not necessarily suggesting that will be anything that can be done but I think it is at least a good idea. He is in agreement with this plan. 12/30/17 on evaluation today patient actually appears to be doing very well in regard to his wounds when compared to previous evaluation. Subsequently we have been using the [facility] Dressing which actually appears to have done excellent on his left lateral lower extremity ulcer. The quality of the wound surface is dramatically improved. There is some slight debridement that is going to be required at a couple of locations but overall I'm extremely pleased with how things appear here. 01/07/2018; this is a patient with a primary skin disorder epidermolyis bullosa. Is a large wound on the left lateral calf and smaller wounds on the right however there is a new wound on the right mid tibia area that occurred within the compression wrap that he did not change. We have been using Hydrofera Blue. On the left he is using Hydrofera Blue and Santyl to the inferior part of the wound and changing the dressing himself. 01/15/2018; primary skin disorder epidermolysis bullosa. He  has several difficult wounds including the left lateral calf, smaller wounds on the left medial calf and the right lateral calf. The major area on the left lateral calf has a smaller area inferiorly that has necrotic debris we have been using Santyl to this. The rest of the wounds we have been using Hydrofera Blue. The area on the left calf actually looks larger this week. Uncontrolled edema several small open areas above it that are superficial 01/20/18 on evaluation today patient appears to be doing better as compared to last week in regard to his wounds of the bilateral lower extremities. He tolerated the bilateral compression wrap without complication. Overall I'm very pleased with how things appear at this time. The patient likewise is very happy. 01/27/18 on evaluation today patient appears to be doing decently well in regard to his bilateral lower extremity ulcers. He's been tolerating the dressing changes without complication. One issue he had is that he did have more drainage to the left leg wrapped last week. He states in fact he probably should come in and let us change it on Friday however he just left it in place and kept adding extra absorption with ABD pads to the external portion of the wrap. Unfortunately he does have some aspiration  type breakdown nothing significant but I do believe that this was probably counterproductive in general. Nonetheless his wounds do not appear to be terrible overall. 02/03/18 on evaluation today patient appears to be doing rather well in regard to his lower extremity ulcers. He has been tolerating the dressing changes without complication. He does tell me that he had to change the wrap on the left one since we last saw him. Subsequently I do not see any evidence of infection I do feel like the food was much better controlled at this point. 02/10/18 on evaluation today patient appears to be doing rather well in regard to his ulcers. He still has  significant alterations especially on the left lateral lower extremity. Fortunately there's no signs of infection at this time. Overall I feel like he is making good progress are some areas that I'm gonna attempt some debridement today. 02/17/18 on evaluation today patient appears to be doing okay in regard to his lower Trinity ulcer. It does appear on both locations he has a little bit of drainage causing some breakdown in maceration around the wound bed's although it doesn't appear to be too bad the right is a little bit worse than left. Fortunately there's no signs of infection which is good news. No fevers, chills, nausea, or vomiting noted at this time. 03/10/18 on evaluation today patient actually appears to be doing rather poorly in regard to his bilateral lower extremity ulcers. The right in particular is draining profusely and the wound is actually enlarging which is not good. I'm concerned about both possibly infection and the fact that there's a lot of moisture which is causing breakdown as well. Unfortunately the patient has been trying to change this at home I'm afraid he may need to change more frequently in order to see the improvement that were looking for. There's no signs of systemic infection. 03/17/18 patient actually appears to be doing significantly better at this point in regard to his bilateral lower extremity ulcers. Fortunately there's no signs of infection. That is worsening infection at least indefinitely nothing systemic. With that being said he is having a lot of drainage though not quite as much is there in his last evaluation. Overall feel like he's on the side of improvement. I think if his results back from his culture which showed that he had a positive group B strep along with abundant Pseudomonas noted on the culture. For that reason I am gonna have him continue with the linezolid as we previously have ordered for him and I did go ahead as well today and  prescribe Levaquin as well in order to treat the Pseudomonas portion of the infection noted. 03/24/18 on evaluation today patient actually appears to be doing very well in regard to his lower Trinity ulcer. He's been tolerating the dressing changes without complication. Fortunately both legs show signs of less drainage in his edema is very well controlled at this point as well. Overall very pleased with how things seem to be progressing. 03/31/18 on evaluation today patient actually appears to be doing excellent in regard to his bilateral lower extremity ulcers. These are not draining nearly as significant as what they were in the past overall seem to be shown signs of excellent improvement which is great news. Fortunately there is no sign of active infection at this time I do believe that the Levaquin has done extremely well for him in this regard. The patient continues to change these at home typically every day. We may  be able to slowly work towards every other day changes since the drainage seems to be slowing down quite significantly. 04/14/18 on evaluation today patient appears to be doing well in regard to his bilateral lower extremities. Let me [facility] Almost completely healed which is excellent news. Fortunately he's shown signs of improvement all other sites as well with new skin growth there's some slight hyper granular tissue but for the most part this seems to be well maintained with the [facility] Dressing. I'm very happy in this regard. 04/28/18 on evaluation today patient appears to be doing rather well in regard to his ulcers of the bilateral lower extremities all things considering. He continues to make some progress as far as new skin growth. There still some hyper granulation noted at this point despite the use of the [facility] Dressing. This is not terrible but I think we may want to consider conclude cauterization today with silver nitrate to try to help knock some of  his back as well as helping with any biofilm on the surface of the wound. 05/12/18 on evaluation today patient's wounds actually appear to be doing fairly well in regard to the bilateral lower extremities. He's been tolerating the dressing changes without complication. Fortunately there's no signs of active infection at this time which is good news. Overall very pleased with how things seem to be progressing. You select silver nitrate was beneficial for him. 05/26/18 on evaluation today patient appears to be doing better in regard to left lower extremity and a little bit worse in regard to the right lower extremity. He states that he was pulling off the [facility] Dressing peel back some of the skin making this area significantly larger than what it was previous. He's not had any issues other than this and states even that hasn't caused any pain he just seems to obviously have a much larger area on the right when compared to the previous time I saw him. No fevers, chills, nausea, or vomiting noted at this time. 06/16/18 on evaluation today patient actually appears to be doing a little better in my pinion in regard to his lower summary ulcers. He has new skin islands that seem to be spreading which is good news. Fortunately there's no signs of active infection at this time. His biggest issue is he tells me that coming as often as he does is becoming very cost prohibitive. He wonders if we can potentially spread this out. 07/14/18 on evaluation today patient appears to be doing a little bit worse in regard to his lower from the ulcer. Unfortunately he still continues to have a significant amount of drainage I think we need to do something to try to help this more. He is still somewhat reluctant to go the route of the Wound VAC although that may be the most appropriate thing for him. No fevers, chills, nausea, or vomiting noted at this time. 08/18/2018 on evaluation today patient actually appears to be  doing quite well with regard to his bilateral lower extremity ulcers. I do feel like that currently he is making great progress the care max does seem to be doing a great job at helping to control the moisture he has no maceration or skin breakdown. Again this seems to be an excellent way to go. 1 thing we may want to change is adding collagen to the base of the wound and then the care max over top he is not opposed to this. 09/15/2018 on evaluation today patient  appears to be doing well with regard to his bilateral lower extremity ulcers. He is showing some signs of improvement not necessarily in size but definitely in appearance. In fact he has a lot of new skin growing throughout the wounds along the edges as well as in the central portion of the wounds on both lower extremities. Overall I am extremely pleased to see this. 10/20/2018 on evaluation today patient actually appears to be doing quite well with regard to his wounds. They are not measuring significantly smaller but he does have a lot of new epithelization noted as compared to previous. Fortunately there is no signs of active infection at this time. No fevers, chills, nausea, vomiting, or diarrhea. 11/17/2018 on evaluation today patient presents for reevaluation concerning his bilateral lower extremity ulcers. Fortunately there is no signs of active infection at this time today. He has been tolerating the dressing changes without complication. No fevers, chills, nausea, vomiting, or diarrhea. Unfortunately in general the patient has not made as much improvement as I would like to have seen up to this point. He has been tolerating the dressing changes without complication and he does an excellent job taking care of his wounds at home in my opinion. The biggest issue I see is that he is just not making the progress that we need to be seeing currently. I think we may want to consider having him seen at a plastic surgery appointment and he has  previously seen someone in years past at [facility] in [place]. That is definitely a possibility for [place] to look into at this point. 12/29/2018 on evaluation today patient appears to be doing better in regard to the overall visual appearance of his wounds which do not appear to be as macerated. He does have a much larger skin island in the middle of the left lower extremity ulcer which is doing much better. He tells me the pain is also significantly better. With that being said overall his improvement as far as the size of the wounds is not better but again these are very irregular in change shape quite often. Fortunately there is no evidence of active infection at this time which is great news. He never heard anything from [facility] regarding the plastic surgery referral that we made to them. 01/26/2019 upon evaluation today patient appears to be doing a little better in regard to his wounds today. He has been tolerating the dressing changes again he performs these for the most part on his own. He does a great job wrapping his legs in my opinion. Unfortunately he has not been able to get down to [facility] to see if there is anything from a plastic surgery standpoint that could be done to help with his legs simply due to the fact that his wife unfortunately sustained a compression fracture in her spine she is seeing Dr. Saintclair Halsted and subsequently is going to be having what sounds to be a kyphoplasty type procedure. With that being said that has not been scheduled yet there is still waiting on an MRI the patient is very busy in fact overly busy trying to help take care of his wife at this point. I completely understand this is more of a strain on him at this time 02/23/2019 upon evaluation today patient actually appears to be making some progress. I am actually very pleased with the overall appearance of his wounds even compared to last evaluation. He seems to be doing quite well. He  is taking  care of his wife unfortunately she did have a compression fracture she has had the procedure for this but still she has a slow road to recovery. For that reason he still not gone to [facility] for a second opinion in this regard. Obviously the goal there was if there was anything that can be done from a skin graft standpoint or otherwise. 03/23/2019 upon evaluation today patient continues to have issues with lower extremity ulcers. Since the beginning he has made progress but at the same time the wounds unfortunately just will not close. We have been trying to get the patient to [facility] to see a specialist there but unfortunately with the everything going on with his wife he has not been able to make that appointment time yet he states he may be able to in the next 1-2 months but is not really sure. 04/27/2019 on evaluation today patient appears to be doing a little bit more poorly. His last evaluation. He appears to have some erythema around the edges of the wound at this point. Fortunately there is no signs of active infection at this time which is good news. No fevers, chills, nausea, vomiting, or diarrhea. Objective Constitutional Well-nourished and well-hydrated in no acute distress. Vitals Time Taken: 12:52 PM, Height: 71 in, Weight: 220 lbs, BMI: 30.7, Temperature: 97.9 F, Pulse: 65 bpm, Respiratory Rate: 18 breaths/min, Blood Pressure: 136/46 mmHg. Respiratory normal breathing without difficulty. Psychiatric this patient is able to make decisions and demonstrates good insight into disease process. Alert and Oriented x 3. pleasant and cooperative. General Notes: Upon inspection patient's wound unfortunately is showing signs of erythema surrounding is also measuring larger this does have me concerned about the possibility of infection just based on what I am seeing. The good news is he has gotten the appropriate collagen but unfortunately just does not  seem to be doing as well as I would like at this point. I did obtain a wound culture today. Integumentary (Hair, Skin) Wound #2 status is Open. Original cause of wound was Gradually Appeared. The wound is located on the Left,Distal,Lateral Lower Leg. The wound measures 12cm length x 15.5cm width x 0.1cm depth; 146.084cm^2 area and 14.608cm^3 volume. There is Fat Layer (Subcutaneous Tissue) Exposed exposed. There is no tunneling or undermining noted. There is a large amount of serosanguineous drainage noted. The wound margin is flat and intact. There is large (67-100%) red granulation within the wound bed. There is a small (1-33%) amount of necrotic tissue within the wound bed including Adherent Slough. Wound #5 status is Open. Original cause of wound was Gradually Appeared. The wound is located on the Right,Proximal,Lateral Lower Leg. The wound measures 16.8cm length x 13.5cm width x 0.1cm depth; 178.128cm^2 area and 17.813cm^3 volume. There is Fat Layer (Subcutaneous Tissue) Exposed exposed. There is no tunneling noted. There is a large amount of serosanguineous drainage noted. The wound margin is flat and intact. There is large (67-100%) red granulation within the wound bed. There is a small (1-33%) amount of necrotic tissue within the wound bed including Adherent Slough. 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 Dressing Change Frequency: Wound #2 Left,Distal,Lateral Lower Leg: Change Dressing every  other day. Wound #5 Right,Proximal,Lateral Lower Leg: Change Dressing every other day. Skin  Barriers/Peri-Wound Care: Barrier cream - to periwound areas Moisturizing lotion - both legs with dressing changes Wound Cleansing: Wound #2 Left,Distal,Lateral Lower Leg: May shower and wash wound with soap and water. - with dressing changes Wound #5 Right,Proximal,Lateral Lower Leg: May shower and wash wound with soap and water. - with dressing changes Primary Wound Dressing: Wound #2 Left,Distal,Lateral Lower Leg: Collagen - fibracol - do not need to moisten Wound #5 Right,Proximal,Lateral Lower Leg: Collagen - fibracol - do not need to moisten Secondary Dressing: Wound #2 Left,Distal,Lateral Lower Leg: Kerlix/Rolled Gauze ABD pad - all wounds Zetuvit or Kerramax Wound #5 Right,Proximal,Lateral Lower Leg: Kerlix/Rolled Gauze ABD pad - all wounds Zetuvit or Kerramax Edema Control: Avoid standing for long periods of time Elevate legs to the level of the heart or above for 30 minutes daily and/or when sitting, a frequency of: - throughout the day Support Garment 20-30 mm/Hg pressure to: - patient to apply juxtalites daily to both legs over ace wraps daily Other: - ACE wrap both legs The following medication(s) was prescribed: Cipro oral 500 mg tablet 1 1 tablet oral taken 2 times per day for 14 days. Do not take a multivitamin with this medication starting 04/27/2019 1. My suggestion currently is good to be that we go ahead and start the patient on Cipro we will make any changes to this regimen once I get the results of the culture back. With that being said in the past this has done well although we did have to add doxycycline the last time we treated him in order to completely treat what was seen on culture. [facility] suggest as well that the patient needs to change his dressings at home and use his juxta lite at all times including when he is at home not just when he is outside of the home. I believe over the past month he is mainly been not using his juxta lite which may be  contributing somewhat as well to his drainage increasing. Nonetheless it may still be more related to infection as well. 3. He does need to elevate his legs much as possible. He can be as active as he needs to be but when he is seated I would like him elevating. We will see patient back for reevaluation in 1 month here in the clinic. If anything worsens or changes patient will contact our office for additional recommendations. Electronic Signature(s) Signed: 04/27/2019 1:32:14 PM By: Worthy Keeler PA-C Entered By: Worthy Keeler on 04/27/2019 13:32:13 -------------------------------------------------------------------------------- SuperBill Details Patient Name: Date of Service: [name] 04/27/2019 Medical Record [id number] Patient Account Number: [id number] Date of Birth/Sex: Treating RN: Aug 09, 1940 (79 y.o. [name] Primary Care Provider: [name] Other Clinician: Referring Provider: Treating Provider/Extender:Stone III, Sharen Hint, MARY Weeks in Treatment: 52 Diagnosis Coding ICD-10 Codes Code Description I87.2 Venous insufficiency (chronic) (peripheral) Q81.8 Other epidermolysis bullosa L97.322 Non-pressure chronic ulcer of left ankle with fat layer exposed L97.822 Non-pressure chronic ulcer of other part of left lower leg with fat layer exposed L97.812 Non-pressure chronic ulcer of other part of right lower leg with fat layer exposed [facility] (primary) hypertension Facility Procedures CPT4 Code: [id number] Description: 99214 - WOUND CARE VISIT-LEV 4 EST PT Modifier: Quantity: 1 Physician Procedures CPT4 Code Description: [id number] - WC PHYS LEVEL 4 - EST PT ICD-10 Diagnosis Description I87.2 Venous insufficiency (chronic) (peripheral) Q81.8 Other epidermolysis bullosa L97.322 Non-pressure chronic  ulcer of left ankle with fat layer L97.822  Non-pressure chronic ulcer of other part of left lower Modifier: exposed leg with fat  laye Quantity: 1 r exposed Electronic Signature(s) Signed: 04/27/2019 1:32:28 PM By: Worthy Keeler PA-C Entered By: Worthy Keeler on 04/27/2019 13:32:27

## 2019-04-28 DIAGNOSIS — I87312 Chronic venous hypertension (idiopathic) with ulcer of left lower extremity: Secondary | ICD-10-CM | POA: Diagnosis not present

## 2019-04-29 DIAGNOSIS — I87312 Chronic venous hypertension (idiopathic) with ulcer of left lower extremity: Secondary | ICD-10-CM | POA: Diagnosis not present

## 2019-04-30 LAB — AEROBIC CULTURE W GRAM STAIN (SUPERFICIAL SPECIMEN)

## 2019-04-30 LAB — AEROBIC CULTURE? (SUPERFICIAL SPECIMEN): Gram Stain: NONE SEEN

## 2019-05-05 ENCOUNTER — Other Ambulatory Visit: Payer: Self-pay | Admitting: Internal Medicine

## 2019-05-05 ENCOUNTER — Other Ambulatory Visit: Payer: Self-pay

## 2019-05-05 DIAGNOSIS — G8929 Other chronic pain: Secondary | ICD-10-CM

## 2019-05-05 DIAGNOSIS — Z0000 Encounter for general adult medical examination without abnormal findings: Secondary | ICD-10-CM

## 2019-05-05 DIAGNOSIS — D649 Anemia, unspecified: Secondary | ICD-10-CM | POA: Diagnosis not present

## 2019-05-05 DIAGNOSIS — N400 Enlarged prostate without lower urinary tract symptoms: Secondary | ICD-10-CM

## 2019-05-05 DIAGNOSIS — D72819 Decreased white blood cell count, unspecified: Secondary | ICD-10-CM | POA: Diagnosis not present

## 2019-05-05 DIAGNOSIS — H903 Sensorineural hearing loss, bilateral: Secondary | ICD-10-CM

## 2019-05-05 DIAGNOSIS — I10 Essential (primary) hypertension: Secondary | ICD-10-CM

## 2019-05-05 DIAGNOSIS — Q819 Epidermolysis bullosa, unspecified: Secondary | ICD-10-CM

## 2019-05-05 DIAGNOSIS — E611 Iron deficiency: Secondary | ICD-10-CM | POA: Diagnosis not present

## 2019-05-05 DIAGNOSIS — D126 Benign neoplasm of colon, unspecified: Secondary | ICD-10-CM | POA: Diagnosis not present

## 2019-05-05 DIAGNOSIS — D500 Iron deficiency anemia secondary to blood loss (chronic): Secondary | ICD-10-CM | POA: Diagnosis not present

## 2019-05-05 DIAGNOSIS — M7918 Myalgia, other site: Secondary | ICD-10-CM | POA: Diagnosis not present

## 2019-05-06 ENCOUNTER — Other Ambulatory Visit: Payer: Self-pay

## 2019-05-06 DIAGNOSIS — D649 Anemia, unspecified: Secondary | ICD-10-CM

## 2019-05-08 LAB — COMPLETE METABOLIC PANEL WITH GFR
AG Ratio: 1.3 (calc) (ref 1.0–2.5)
ALT: 8 U/L — ABNORMAL LOW (ref 9–46)
AST: 14 U/L (ref 10–35)
Albumin: 3.9 g/dL (ref 3.6–5.1)
Alkaline phosphatase (APISO): 75 U/L (ref 35–144)
BUN: 15 mg/dL (ref 7–25)
CO2: 29 mmol/L (ref 20–32)
Calcium: 9.2 mg/dL (ref 8.6–10.3)
Chloride: 103 mmol/L (ref 98–110)
Creat: 0.97 mg/dL (ref 0.70–1.18)
GFR, Est African American: 86 mL/min/{1.73_m2} (ref >=60)
GFR, Est Non African American: 74 mL/min/{1.73_m2} (ref >=60)
Globulin: 2.9 g/dL (calc) (ref 1.9–3.7)
Glucose, Bld: 95 mg/dL (ref 65–99)
Potassium: 4.6 mmol/L (ref 3.5–5.3)
Sodium: 139 mmol/L (ref 135–146)
Total Bilirubin: 0.4 mg/dL (ref 0.2–1.2)
Total Protein: 6.8 g/dL (ref 6.1–8.1)

## 2019-05-08 LAB — CBC WITH DIFFERENTIAL/PLATELET
Absolute Monocytes: 365 cells/uL (ref 200–950)
Basophils Absolute: 30 cells/uL (ref 0–200)
Basophils Relative: 0.6 %
Eosinophils Absolute: 30 cells/uL (ref 15–500)
Eosinophils Relative: 0.6 %
HCT: 35.2 % — ABNORMAL LOW (ref 38.5–50.0)
Hemoglobin: 11.4 g/dL — ABNORMAL LOW (ref 13.2–17.1)
Lymphs Abs: 765 cells/uL — ABNORMAL LOW (ref 850–3900)
MCH: 29.4 pg (ref 27.0–33.0)
MCHC: 32.4 g/dL (ref 32.0–36.0)
MCV: 90.7 fL (ref 80.0–100.0)
MPV: 8.8 fL (ref 7.5–12.5)
Monocytes Relative: 7.3 %
Neutro Abs: 3810 cells/uL (ref 1500–7800)
Neutrophils Relative %: 76.2 %
Platelets: 250 10*3/uL (ref 140–400)
RBC: 3.88 10*6/uL — ABNORMAL LOW (ref 4.20–5.80)
RDW: 12.4 % (ref 11.0–15.0)
Total Lymphocyte: 15.3 %
WBC: 5 10*3/uL (ref 3.8–10.8)

## 2019-05-08 LAB — TEST AUTHORIZATION

## 2019-05-08 LAB — LIPID PANEL
Cholesterol: 134 mg/dL (ref <200)
HDL: 46 mg/dL (ref >=40)
LDL Cholesterol (Calc): 77 mg/dL (calc)
Non-HDL Cholesterol (Calc): 88 mg/dL (calc) (ref <130)
Total CHOL/HDL Ratio: 2.9 (calc) (ref <5.0)
Triglycerides: 39 mg/dL (ref <150)

## 2019-05-08 LAB — IRON,?TOTAL/TOTAL IRON BINDING CAP
%SAT: 17 % (calc) — ABNORMAL LOW (ref 20–48)
Iron: 48 ug/dL — ABNORMAL LOW (ref 50–180)
TIBC: 289 mcg/dL (calc) (ref 250–425)

## 2019-05-08 LAB — PSA: PSA: 0.6 ng/mL (ref <=4.0)

## 2019-05-08 LAB — VITAMIN B12: Vitamin B-12: 417 pg/mL (ref 200–1100)

## 2019-05-08 LAB — FOLATE: Folate: 22.3 ng/mL

## 2019-05-10 ENCOUNTER — Ambulatory Visit: Payer: Medicare PPO | Admitting: Internal Medicine

## 2019-05-10 ENCOUNTER — Other Ambulatory Visit: Payer: Self-pay

## 2019-05-10 ENCOUNTER — Encounter: Payer: Self-pay | Admitting: Internal Medicine

## 2019-05-10 VITALS — BP 120/70 | HR 72 | Temp 98.5°F | Ht 70.0 in | Wt 194.0 lb

## 2019-05-10 DIAGNOSIS — Z0000 Encounter for general adult medical examination without abnormal findings: Secondary | ICD-10-CM | POA: Diagnosis not present

## 2019-05-10 DIAGNOSIS — N401 Enlarged prostate with lower urinary tract symptoms: Secondary | ICD-10-CM

## 2019-05-10 DIAGNOSIS — I10 Essential (primary) hypertension: Secondary | ICD-10-CM

## 2019-05-10 DIAGNOSIS — G8929 Other chronic pain: Secondary | ICD-10-CM

## 2019-05-10 DIAGNOSIS — M545 Low back pain, unspecified: Secondary | ICD-10-CM

## 2019-05-10 DIAGNOSIS — Q819 Epidermolysis bullosa, unspecified: Secondary | ICD-10-CM | POA: Diagnosis not present

## 2019-05-10 DIAGNOSIS — D500 Iron deficiency anemia secondary to blood loss (chronic): Secondary | ICD-10-CM | POA: Diagnosis not present

## 2019-05-10 DIAGNOSIS — H903 Sensorineural hearing loss, bilateral: Secondary | ICD-10-CM

## 2019-05-10 DIAGNOSIS — F1021 Alcohol dependence, in remission: Secondary | ICD-10-CM

## 2019-05-10 LAB — POCT URINALYSIS DIPSTICK
Appearance: NEGATIVE
Bilirubin, UA: NEGATIVE
Blood, UA: NEGATIVE
Glucose, UA: NEGATIVE
Ketones, UA: NEGATIVE
Leukocytes, UA: NEGATIVE
Nitrite, UA: NEGATIVE
Odor: NEGATIVE
Protein, UA: NEGATIVE
Spec Grav, UA: 1.01 (ref 1.010–1.025)
Urobilinogen, UA: 0.2 E.U./dL
pH, UA: 6.5 (ref 5.0–8.0)
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
## 2019-05-10 NOTE — Patient Instructions (Addendum)
Take multivitamin with iron supplement.  Return in 1 year or as needed.  Patient wants to defer colonoscopy at this point in time.  Continue antihypertensive medication and Flomax for BPH.  Continue tramadol for back pain.  It was a pleasure to see you today.

## 2019-05-13 ENCOUNTER — Other Ambulatory Visit: Payer: Self-pay | Admitting: Internal Medicine

## 2019-05-16 ENCOUNTER — Other Ambulatory Visit: Payer: Self-pay | Admitting: Internal Medicine

## 2019-05-25 ENCOUNTER — Encounter (HOSPITAL_BASED_OUTPATIENT_CLINIC_OR_DEPARTMENT_OTHER): Payer: Medicare PPO | Admitting: Physician Assistant

## 2019-05-26 DIAGNOSIS — H353221 Exudative age-related macular degeneration, left eye, with active choroidal neovascularization: Secondary | ICD-10-CM | POA: Diagnosis not present

## 2019-05-26 DIAGNOSIS — H353112 Nonexudative age-related macular degeneration, right eye, intermediate dry stage: Secondary | ICD-10-CM | POA: Diagnosis not present

## 2019-06-01 ENCOUNTER — Encounter (HOSPITAL_BASED_OUTPATIENT_CLINIC_OR_DEPARTMENT_OTHER): Payer: Medicare PPO | Admitting: Physician Assistant

## 2019-06-06 NOTE — Progress Notes (Signed)
Carlos White ([id number]) Visit Report for 04/27/2019 Arrival Information Details Patient Name: Date of Service: Carlos White 04/27/2019 12:30 PM Medical Record Number: [id number] Patient Account Number: [id number] Date of Birth/Sex: Treating RN: 03/29/1940 (79 y.o. Carlos White Primary Care Synethia Endicott: Alton Revere, [place] RY Other Clinician: Referring Carlos White: Treating Carlos White/Extender: Carlos Reddish, MA RY Weeks in Treatment: 34 Visit Information History Since Last Visit Added or deleted any medications: No Patient Arrived: Ambulatory Any new allergies or adverse reactions: No Arrival Time: 12:52 Had a fall or experienced change in No Accompanied By: alone activities of daily living that may affect Transfer Assistance: None risk of falls: Patient Identification Verified: Yes Signs or symptoms of abuse/neglect since last visito No Secondary Verification Process Completed: Yes Hospitalized since last visit: No Patient Requires Transmission-Based Precautions: No Implantable device outside of the clinic excluding No Patient Has Alerts: Yes cellular tissue based products placed in the center Patient Alerts: R ABI= 1.23, TBI = .86 since last visit: L ABI= 1.28, TBI=.81 Has Dressing in Place as Prescribed: Yes Has Compression in Place as Prescribed: Yes Pain Present Now: No Electronic Signature(s) Signed: 04/27/2019 5:43:43 PM By: Levan Hurst RN, BSN Entered By: Levan Hurst on 04/27/2019 12:52:45 -------------------------------------------------------------------------------- Clinic Level of Care Assessment Details Patient Name: Date of Service: Carlos White. 04/27/2019 12:30 PM Medical Record Number: [id number] Patient Account Number: [id number] Date of Birth/Sex: Treating RN: [birth year] (79 y.o. Carlos White Primary Care Carlos White: Alton Revere, [place] RY Other Clinician: Referring Carlos White: Treating Carlos White/Extender: Carlos Reddish, MA RY Weeks  in Treatment: 31 Clinic Level of Care Assessment Items TOOL 4 Quantity Score []  - 0 Use when only an EandM is performed on FOLLOW-UP visit ASSESSMENTS - Nursing Assessment / Reassessment X- 1 10 Reassessment of Co-morbidities (includes updates in patient status) X- 1 5 Reassessment of Adherence to Treatment Plan ASSESSMENTS - Wound and Skin A ssessment / Reassessment []  - 0 Simple Wound Assessment / Reassessment - one wound X- 2 5 Complex Wound Assessment / Reassessment - multiple wounds []  - 0 Dermatologic / Skin Assessment (not related to wound area) ASSESSMENTS - Focused Assessment X- 2 5 Circumferential Edema Measurements - multi extremities []  - 0 Nutritional Assessment / Counseling / Intervention X- 1 5 Lower Extremity Assessment (monofilament, tuning fork, pulses) []  - 0 Peripheral Arterial Disease Assessment (using hand held doppler) ASSESSMENTS - Ostomy and/or Continence Assessment and Care []  - 0 Incontinence Assessment and Management []  - 0 Ostomy Care Assessment and Management (repouching, etc.) PROCESS - Coordination of Care X - Simple Patient / Family Education for ongoing care 1 15 []  - 0 Complex (extensive) Patient / Family Education for ongoing care X- 1 10 Staff obtains Programmer, systems, Records, T Results / Process Orders est []  - 0 Staff telephones HHA, Nursing Homes / Clarify orders / etc []  - 0 Routine Transfer to another Facility (non-emergent condition) []  - 0 Routine Hospital Admission (non-emergent condition) []  - 0 New Admissions / Biomedical engineer / Ordering NPWT Apligraf, etc. , []  - 0 Emergency Hospital Admission (emergent condition) X- 1 10 Simple Discharge Coordination []  - 0 Complex (extensive) Discharge Coordination PROCESS - Special Needs []  - 0 Pediatric / Minor Patient Management []  - 0 Isolation Patient Management []  - 0 Hearing / Language / Visual special needs []  - 0 Assessment of Community assistance  (transportation, D/C planning, etc.) []  - 0 Additional assistance / Altered mentation []  - 0  Support Surface(s) Assessment (bed, cushion, seat, etc.) INTERVENTIONS - Wound Cleansing / Measurement []  - 0 Simple Wound Cleansing - one wound X- 2 5 Complex Wound Cleansing - multiple wounds X- 1 5 Wound Imaging (photographs - any number of wounds) []  - 0 Wound Tracing (instead of photographs) []  - 0 Simple Wound Measurement - one wound X- 2 5 Complex Wound Measurement - multiple wounds INTERVENTIONS - Wound Dressings []  - 0 Small Wound Dressing one or multiple wounds X- 2 15 Medium Wound Dressing one or multiple wounds []  - 0 Large Wound Dressing one or multiple wounds X- 1 5 Application of Medications - topical []  - 0 Application of Medications - injection INTERVENTIONS - Miscellaneous []  - 0 External ear exam []  - 0 Specimen Collection (cultures, biopsies, blood, body fluids, etc.) []  - 0 Specimen(s) / Culture(s) sent or taken to Lab for analysis []  - 0 Patient Transfer (multiple staff / Civil Service fast streamer / Similar devices) []  - 0 Simple Staple / Suture removal (25 or less) []  - 0 Complex Staple / Suture removal (26 or more) []  - 0 Hypo / Hyperglycemic Management (close monitor of Blood Glucose) []  - 0 Ankle / Brachial Index (ABI) - do not check if billed separately X- 1 5 Vital Signs Has the patient been seen at the hospital within the last three years: Yes Total Score: 140 Level Of Care: New/Established - Level 4 Electronic Signature(s) Signed: 04/27/2019 5:43:24 PM By: Baruch Gouty RN, BSN Entered By: Baruch Gouty on 04/27/2019 13:28:55 -------------------------------------------------------------------------------- Encounter Discharge Information Details Patient Name: Date of Service: Carlos White, [name]. 04/27/2019 12:30 PM Medical Record Number: [id number] Patient Account Number: [id number] Date of Birth/Sex: Treating RN: 08/31/1940 (79 y.o. Carlos White Primary Care Michalle Rademaker: Alton Revere, [place] RY Other Clinician: Referring Carlos White: Treating Carlos White/Extender: Carlos Reddish, MA RY Weeks in Treatment: 9 Encounter Discharge Information Items Discharge Condition: Stable Ambulatory Status: Ambulatory Discharge Destination: Home Transportation: Private Auto Accompanied By: self Schedule Follow-up Appointment: Yes Clinical Summary of Care: Electronic Signature(s) Signed: 06/06/2019 1:31:05 PM By: [name] Entered By: [name] on 04/27/2019 14:23:46 -------------------------------------------------------------------------------- Lower Extremity Assessment Details Patient Name: Date of Service: Carlos White 04/27/2019 12:30 PM Medical Record Number: [id number] Patient Account Number: [id number] Date of Birth/Sex: Treating RN: [birth year] (79 y.o. Carlos White Primary Care Aylinn Rydberg: Alton Revere, [place] RY Other Clinician: Referring Maelie Chriswell: Treating Keyontae Huckeby/Extender: Carlos Reddish, [place] RY Weeks in Treatment: 14 Edema Assessment Assessed: [Left: No] [Right: No] Edema: [Left: Yes] [Right: Yes] Calf Left: Right: Point of Measurement: 33 cm From Medial Instep 36 cm 38.8 cm Ankle Left: Right: Point of Measurement: 12 cm From Medial Instep 25.6 cm 23.5 cm Vascular Assessment Pulses: Dorsalis Pedis Palpable: [Left:Yes] [Right:Yes] Electronic Signature(s) Signed: 04/27/2019 5:43:43 PM By: Levan Hurst RN, BSN Entered By: Levan Hurst on 04/27/2019 13:00:43 -------------------------------------------------------------------------------- Multi-Disciplinary Care Plan Details Patient Name: Date of Service: Carlos White, [name]. 04/27/2019 12:30 PM Medical Record Number: [id number] Patient Account Number: [id number] Date of Birth/Sex: Treating RN: [birth year] (79 y.o. Carlos White Primary Care Jupiter Boys: Alton Revere, [place] RY Other Clinician: Referring Kasia Trego: Treating Rael Tilly/Extender: Carlos Reddish, [place] RY Weeks in Treatment: 71 Active Inactive Venous Leg Ulcer Nursing Diagnoses: Knowledge deficit related to disease process and management Potential for venous Insuffiency (use before diagnosis confirmed) Goals: Patient will maintain optimal edema control Date Initiated: 09/30/2017 Target Resolution Date: 05/18/2019 Goal Status: Active Patient/caregiver  will verbalize understanding of disease process and disease management Date Initiated: 09/30/2017 Date Inactivated: 11/04/2017 Target Resolution Date: 10/30/2017 Goal Status: Met Interventions: Assess peripheral edema status every visit. Provide education on venous insufficiency Notes: Wound/Skin Impairment Nursing Diagnoses: Impaired tissue integrity Knowledge deficit related to ulceration/compromised skin integrity Goals: Patient/caregiver will verbalize understanding of skin care regimen Date Initiated: 09/30/2017 Target Resolution Date: 05/18/2019 Goal Status: Active Ulcer/skin breakdown will have a volume reduction of 30% by week 4 Date Initiated: 09/30/2017 Date Inactivated: 11/04/2017 Target Resolution Date: 10/30/2017 Goal Status: Met Interventions: Assess patient/caregiver ability to obtain necessary supplies Assess patient/caregiver ability to perform ulcer/skin care regimen upon admission and as needed Assess ulceration(s) every visit Provide education on ulcer and skin care Notes: Electronic Signature(s) Signed: 04/27/2019 5:43:24 PM By: Baruch Gouty RN, BSN Entered By: Baruch Gouty on 04/27/2019 13:03:54 -------------------------------------------------------------------------------- Pain Assessment Details Patient Name: Date of Service: Carlos White, [name]. 04/27/2019 12:30 PM Medical Record Number: [id number] Patient Account Number: [id number] Date of Birth/Sex: Treating RN: [birth year] (79 y.o. Carlos White Primary Care Billiejo Sorto: Alton Revere, [place] RY Other Clinician: Referring  Coreyon Nicotra: Treating Tacha Manni/Extender: Carlos Reddish, [place] RY Weeks in Treatment: 60 Active Problems Location of Pain Severity and Description of Pain Patient Has Paino No Site Locations Pain Management and Medication Current Pain Management: Electronic Signature(s) Signed: 04/27/2019 5:43:43 PM By: Levan Hurst RN, BSN Entered By: Levan Hurst on 04/27/2019 12:55:44 -------------------------------------------------------------------------------- Patient/Caregiver Education Details Patient Name: Date of Service: Carlos White 4/14/2021andnbsp12:30 PM Medical Record Number: [id number] Patient Account Number: [id number] Date of Birth/Gender: Treating RN: [birth year] (79 y.o. Carlos White Primary Care Physician: Alton Revere, [place] RY Other Clinician: Referring Physician: Treating Physician/Extender: Carlos Reddish, [place] RY Weeks in Treatment: 57 Education Assessment Education Provided To: Patient Education Topics Provided Venous: Methods: Explain/Verbal Responses: Reinforcements needed, State content correctly Wound/Skin Impairment: Methods: Explain/Verbal Responses: Reinforcements needed, State content correctly Electronic Signature(s) Signed: 04/27/2019 5:43:24 PM By: Baruch Gouty RN, BSN Entered By: Baruch Gouty on 04/27/2019 13:04:17 -------------------------------------------------------------------------------- Wound Assessment Details Patient Name: Date of Service: Carlos White, [name]. 04/27/2019 12:30 PM Medical Record Number: [id number] Patient Account Number: [id number] Date of Birth/Sex: Treating RN: 07-03-1940 (79 y.o. Carlos White Primary Care Ramesh Moan: Alton Re[place], [place] RY Other Clinician: Referring Ruweyda Macknight: Treating Guneet Delpino/Extender: Carlos Reddish, [place] RY Weeks in Treatment: 30 Wound Status Wound Number: 2 Primary Venous Leg Ulcer Etiology: Wound Location: Left, Distal, Lateral Lower Leg Wound Status: Open Wounding  Event: Gradually Appeared Comorbid Cataracts, Chronic Obstructive Pulmonary Disease (COPD), Date Acquired: 03/13/2017 History: Hypertension Weeks Of Treatment: 82 Clustered Wound: No Photos Photo Uploaded By: Mikeal Hawthorne on 04/29/2019 09:15:57 Wound Measurements Length: (cm) 12 Width: (cm) 15.5 Depth: (cm) 0.1 Area: (cm) 146.084 Volume: (cm) 14.608 % Reduction in Area: -88.4% % Reduction in Volume: 62.3% Epithelialization: Small (1-33%) Tunneling: No Undermining: No Wound Description Classification: Full Thickness Without Exposed Support Structures Wound Margin: Flat and Intact Exudate Amount: Large Exudate Type: Serosanguineous Exudate Color: red, brown Foul Odor After Cleansing: No Slough/Fibrino Yes Wound Bed Granulation Amount: Large (67-100%) Exposed Structure Granulation Quality: Red Fascia Exposed: No Necrotic Amount: Small (1-33%) Fat Layer (Subcutaneous Tissue) Exposed: Yes Necrotic Quality: Adherent Slough Tendon Exposed: No Muscle Exposed: No Joint Exposed: No Bone Exposed: No Treatment Notes Wound #2 (Left, Distal, Lateral Lower Leg) 1. Cleanse With Wound Cleanser 2. Periwound Care Barrier cream Moisturizing lotion 3. Primary Dressing Applied Collagen 4. Secondary  Dressing ABD Pad Roll Gauze Kerramax/Xtrasorb 6. Support Layer Education officer, community) Signed: 04/27/2019 5:43:43 PM By: Levan Hurst RN, BSN Entered By: Levan Hurst on 04/27/2019 13:02:45 -------------------------------------------------------------------------------- Wound Assessment Details Patient Name: Date of Service: Carlos White, [name]. 04/27/2019 12:30 PM Medical Record Number: [id number] Patient Account Number: [id number] Date of Birth/Sex: Treating RN: 09/15/1940 (79 y.o. Carlos White Primary Care Demetry Bendickson: Alton [place], [place] RY Other Clinician: Referring Aily Tzeng: Treating Xaivier Malay/Extender: Carlos Reddish, [place] RY Weeks in Treatment:  6 Wound Status Wound Number: 5 Primary Vasculopathy Etiology: Wound Location: Right, Proximal, Lateral Lower Leg Wound Status: Open Wounding Event: Gradually Appeared Comorbid Cataracts, Chronic Obstructive Pulmonary Disease (COPD), Date Acquired: 10/26/2017 History: Hypertension Weeks Of Treatment: 78 Clustered Wound: No Photos Photo Uploaded By: Mikeal Hawthorne on 04/29/2019 09:15:58 Wound Measurements Length: (cm) 16.8 Width: (cm) 13.5 Depth: (cm) 0.1 Area: (cm) 178.128 Volume: (cm) 17.813 % Reduction in Area: -228.1% % Reduction in Volume: -228.1% Epithelialization: Small (1-33%) Tunneling: No Wound Description Classification: Full Thickness Without Exposed Support Struct Wound Margin: Flat and Intact Exudate Amount: Large Exudate Type: Serosanguineous Exudate Color: red, brown ures Foul Odor After Cleansing: No Slough/Fibrino Yes Wound Bed Granulation Amount: Large (67-100%) Exposed Structure Granulation Quality: Red Fascia Exposed: No Necrotic Amount: Small (1-33%) Fat Layer (Subcutaneous Tissue) Exposed: Yes Necrotic Quality: Adherent Slough Tendon Exposed: No Muscle Exposed: No Joint Exposed: No Bone Exposed: No Treatment Notes Wound #5 (Right, Proximal, Lateral Lower Leg) 1. Cleanse With Wound Cleanser 2. Periwound Care Barrier cream Moisturizing lotion 3. Primary Dressing Applied Collagen 4. Secondary Dressing ABD Pad Roll Gauze Kerramax/Xtrasorb 6. Support Layer Education officer, community) Signed: 04/27/2019 5:43:43 PM By: Levan Hurst RN, BSN Entered By: Levan Hurst on 04/27/2019 13:02:56 -------------------------------------------------------------------------------- Vitals Details Patient Name: Date of Service: Carlos White, [name]. 04/27/2019 12:30 PM Medical Record Number: [id number] Patient Account Number: [id number] Date of Birth/Sex: Treating RN: January 01, 1941 (79 y.o. Carlos White Primary Care Preeya Cleckley: Alton Re[place],  [place] RY Other Clinician: Referring Ally Knodel: Treating Teodora Baumgarten/Extender: Carlos Reddish, [place] RY Weeks in Treatment: 46 Vital Signs Time Taken: 12:52 Temperature (F): 97.9 Height (in): 71 Pulse (bpm): 65 Weight (lbs): 220 Respiratory Rate (breaths/min): 18 Body Mass Index (BMI): 30.7 Blood Pressure (mmHg): 136/46 Reference Range: 80 - 120 mg / dl Electronic Signature(s) Signed: 04/27/2019 5:43:43 PM By: Levan Hurst RN, BSN Entered By: Levan Hurst on 04/27/2019 12:55:38

## 2019-06-08 ENCOUNTER — Encounter (HOSPITAL_BASED_OUTPATIENT_CLINIC_OR_DEPARTMENT_OTHER): Payer: Medicare PPO | Attending: Physician Assistant | Admitting: Physician Assistant

## 2019-06-08 ENCOUNTER — Other Ambulatory Visit: Payer: Self-pay

## 2019-06-08 DIAGNOSIS — Q819 Epidermolysis bullosa, unspecified: Secondary | ICD-10-CM | POA: Diagnosis not present

## 2019-06-08 DIAGNOSIS — L97322 Non-pressure chronic ulcer of left ankle with fat layer exposed: Secondary | ICD-10-CM | POA: Diagnosis not present

## 2019-06-08 DIAGNOSIS — L97812 Non-pressure chronic ulcer of other part of right lower leg with fat layer exposed: Secondary | ICD-10-CM | POA: Diagnosis not present

## 2019-06-08 DIAGNOSIS — L97822 Non-pressure chronic ulcer of other part of left lower leg with fat layer exposed: Secondary | ICD-10-CM | POA: Diagnosis not present

## 2019-06-08 DIAGNOSIS — L97829 Non-pressure chronic ulcer of other part of left lower leg with unspecified severity: Secondary | ICD-10-CM | POA: Diagnosis present

## 2019-06-08 DIAGNOSIS — I872 Venous insufficiency (chronic) (peripheral): Secondary | ICD-10-CM | POA: Diagnosis not present

## 2019-06-08 DIAGNOSIS — I10 Essential (primary) hypertension: Secondary | ICD-10-CM | POA: Diagnosis not present

## 2019-06-08 NOTE — Progress Notes (Addendum)
Carlos White ([id number]) Visit Report for 06/08/2019 Chief Complaint Document Details Patient Name: Date of Service: Carlos White 06/08/2019 12:30 PM Medical Record Number: [id number] Patient Account Number: [id number] Date of Birth/Sex: Treating RN: [birth year] (79 y.o. Carlos White Primary Care Provider: Alton White, [place] RY Other Clinician: Referring Provider: Treating Provider/Extender: Garret Reddish, MA RY Weeks in Treatment: 65 Information Obtained from: Patient Chief Complaint Bilateral LE Ulcers Electronic Signature(s) Signed: 06/08/2019 12:40:21 PM By: Worthy Keeler PA-C Entered By: Worthy Keeler on 06/08/2019 12:40:20 -------------------------------------------------------------------------------- HPI Details Patient Name: Date of Service: Carlos White RDO [name] 06/08/2019 12:30 PM Medical Record Number: [id number] Patient Account Number: [id number] Date of Birth/Sex: Treating RN: [birth year] (79 y.o. Carlos White Primary Care Provider: Alton White, [place] RY Other Clinician: Referring Provider: Treating Provider/Extender: Garret Reddish, MA RY Weeks in Treatment: 39 History of Present Illness HPI Description: 09/30/17 on evaluation today patient presents for initial evaluation and our clinic concerning issues that he has been having with his bilateral lower extremities. He states this has been going on for quite some time at least six months. Currently his regiment has been mainly cleaning the area with peroxide, applying the is foreign ointment, and wrapping the area with ABD pads and then an ace wrap loosely. He has dealt with issues of this nature he tells me for quite some time. He does have a history of having had a compound fracture of the left lower extremity which he thinks also makes this a much more difficult area for him to heal. He's previously been told that he had poor vascular flow but this was years ago at [facility] we do not have any of  those records at this time. He has a history of Epidermolysis Bullosa which was diagnosed around age 17 and he has been cared for at [facility] since that time. Subsequently he states this is hereditary and two of his children one male and one male also have this as well is one of his grandchildren that he is aware of. He has no evidence of infection necessarily at this point although he does have some necrotic tissue noted on the surface of the wound as far as the largest, left lateral lower extremity ulcer, is concerned. Overall I feel like all things considered he's been taking care of this very well. Obviously he has some fairly significant issues going on at this point in this regard. He does have a history otherwise of hypertension though for the most part other than the compound fracture of the left leg he seems to have been fairly healthy in my pinion. 10/07/17 on evaluation today patient actually appears to be doing better in regard to his bilateral lower extremity ulcers. With that being said he does still have some evidence of slough noted on the surface of the wounds I think the Iodoflex has been beneficial for him. His arterial studies are scheduled for October 2. With that being said I do believe that he is continuing to show signs of good improvement which is at least good news. 10/14/17 on evaluation today patient appears to be doing very well in regard to his lower extremity ulcers. He's definitely made some progress as far as healing is concerned although there still are several open areas that are going to need to be addressed. He did have his arterial study today which fortunately shows good findings with a right ABI of 1.23 with  a TBI of 0.86 in the left ABI of 1.28 with a TBI of 0.81. This is good news and will allow [place] to perform debridement as well. 10/23/2017; patient with a large wound on the left lateral calf, sizable area on the left medial malleolus and an area on the right  lateral malleolus. He has a new blister consistent with his underlying blistering skin disease just above this area we have been using Iodoflex on the lateral left calf lateral right ankle and collagen on the medial left ankle. We have been using Kerlix Coban wraps 10/28/17 on evaluation today the patient continues to have signs of improvement in regard to the overall appearance of the original wound. Unfortunately he did have some blistering over the right lateral lower extremity which has appeared to rupture on evaluation today and likely some of the dead tissue on the surface needs to be cleaned away the good news is this does not appear to be to significantly deep at this time. 11/04/17 on evaluation today patient actually appears to be doing a little better in regard to his lower extremity ulcers. He has been tolerating the dressing changes without complication. With that being said he does still have a significant wound especially over the left lateral lower extremity unfortunately. All of the wounds pretty much are going to require sharp debridement today. 11/11/17 on evaluation today patient appears to be doing more poorly in regard to his left lower extremity in particular. There does not appear to be any evidence of systemic infection although the wound itself as far as the larger left lateral lower extremity ulcer actually appears to be infected in my pinion. There's an older and the surface of the wound is dramatically worsened compared to last week. No fevers, chills, nausea, or vomiting noted at this time. 11/18/17 upon evaluation today patient actually appears to be doing better. I did review his culture today which really did not show any specific organism is a positive reason for his wound decline. There are multiple organisms present not predominant. Nonetheless he seems to be tolerate the doxycycline well in his wounds in general do seem to be doing better. Fortunately there does not  appear to be any evidence of infection at this time which is good news. Overall I'm very pleased with how things appear. Nonetheless he still has a lot of healing to go. I do think he could benefit from a Juxta-Lite wrap. 11/25/17 on evaluation today patient actually appears to be doing fairly well in regard to his wounds. He is still taking the antibiotics he has a few days left. Fortunately this seems to have been excellent for him as far as getting the infection control and very happy in this regard. With that being said the patient likewise is also very pleased with how things appear at this time in comparison to where we were he's not having as much pain. 12/02/17 Seen today for follow p and management of LLE wounds. Wounds appear to show some improvement. He denies pain, fever, or chills. Completed a course of doxycycline earlier this month. Scheduled to received Juxta-Lite wrap this week. No s/s of infections. 12/09/17 on evaluation today patient actually appears to be doing a little bit better in regard to his wounds. This is obscene very slow process and unfortunately he has a couple of new areas and this is due to the Epidermolysis Bullosa. Nonetheless I am concerned about the fact that he seems to be getting more areas not less  that is the reason we're gonna work on getting schedule for the vascular referral to see the venous specialist. 12/23/17 upon evaluation today patient's wounds currently shows evidence of still not doing quite as well is what I would like to have seen. Subsequently the patient did have his venous study which showed evidence of venous stasis. Subsequently I do think that a vascular evaluation for consideration of venous intervention would be appropriate. I'm not necessarily suggesting that will be anything that can be done but I think it is at least a good idea. He is in agreement with this plan. 12/30/17 on evaluation today patient actually appears to be doing very  well in regard to his wounds when compared to previous evaluation. Subsequently we have been using the [facility] Dressing which actually appears to have done excellent on his left lateral lower extremity ulcer. The quality of the wound surface is dramatically improved. There is some slight debridement that is going to be required at a couple of locations but overall I'm extremely pleased with how things appear here. 01/07/2018; this is a patient with a primary skin disorder epidermolyis bullosa. Is a large wound on the left lateral calf and smaller wounds on the right however there is a new wound on the right mid tibia area that occurred within the compression wrap that he did not change. We have been using Hydrofera Blue. On the left he is using Hydrofera Blue and Santyl to the inferior part of the wound and changing the dressing himself. 01/15/2018; primary skin disorder epidermolysis bullosa. He has several difficult wounds including the left lateral calf, smaller wounds on the left medial calf and the right lateral calf. The major area on the left lateral calf has a smaller area inferiorly that has necrotic debris we have been using Santyl to this. The rest of the wounds we have been using Hydrofera Blue. The area on the left calf actually looks larger this week. Uncontrolled edema several small open areas above it that are superficial 01/20/18 on evaluation today patient appears to be doing better as compared to last week in regard to his wounds of the bilateral lower extremities. He tolerated the bilateral compression wrap without complication. Overall I'm very pleased with how things appear at this time. The patient likewise is very happy. 01/27/18 on evaluation today patient appears to be doing decently well in regard to his bilateral lower extremity ulcers. He's been tolerating the dressing changes without complication. One issue he had is that he did have more drainage to the left leg wrapped  last week. He states in fact he probably should come in and let us change it on Friday however he just left it in place and kept adding extra absorption with ABD pads to the external portion of the wrap. Unfortunately he does have some aspiration type breakdown nothing significant but I do believe that this was probably counterproductive in general. Nonetheless his wounds do not appear to be terrible overall. 02/03/18 on evaluation today patient appears to be doing rather well in regard to his lower extremity ulcers. He has been tolerating the dressing changes without complication. He does tell me that he had to change the wrap on the left one since we last saw him. Subsequently I do not see any evidence of infection I do feel like the food was much better controlled at this point. 02/10/18 on evaluation today patient appears to be doing rather well in regard to his ulcers. He still has significant  alterations especially on the left lateral lower extremity. Fortunately there's no signs of infection at this time. Overall I feel like he is making good progress are some areas that I'm gonna attempt some debridement today. 02/17/18 on evaluation today patient appears to be doing okay in regard to his lower Trinity ulcer. It does appear on both locations he has a little bit of drainage causing some breakdown in maceration around the wound bed's although it doesn't appear to be too bad the right is a little bit worse than left. Fortunately there's no signs of infection which is good news. No fevers, chills, nausea, or vomiting noted at this time. 03/10/18 on evaluation today patient actually appears to be doing rather poorly in regard to his bilateral lower extremity ulcers. The right in particular is draining profusely and the wound is actually enlarging which is not good. I'm concerned about both possibly infection and the fact that there's a lot of moisture which is causing breakdown as well. Unfortunately  the patient has been trying to change this at home I'm afraid he may need to change more frequently in order to see the improvement that were looking for. There's no signs of systemic infection. 03/17/18 patient actually appears to be doing significantly better at this point in regard to his bilateral lower extremity ulcers. Fortunately there's no signs of infection. That is worsening infection at least indefinitely nothing systemic. With that being said he is having a lot of drainage though not quite as much is there in his last evaluation. Overall feel like he's on the side of improvement. I think if his results back from his culture which showed that he had a positive group B strep along with abundant Pseudomonas noted on the culture. For that reason I am gonna have him continue with the linezolid as we previously have ordered for him and I did go ahead as well today and prescribe Levaquin as well in order to treat the Pseudomonas portion of the infection noted. 03/24/18 on evaluation today patient actually appears to be doing very well in regard to his lower Trinity ulcer. He's been tolerating the dressing changes without complication. Fortunately both legs show signs of less drainage in his edema is very well controlled at this point as well. Overall very pleased with how things seem to be progressing. 03/31/18 on evaluation today patient actually appears to be doing excellent in regard to his bilateral lower extremity ulcers. These are not draining nearly as significant as what they were in the past overall seem to be shown signs of excellent improvement which is great news. Fortunately there is no sign of active infection at this time I do believe that the Levaquin has done extremely well for him in this regard. The patient continues to change these at home typically every day. We may be able to slowly work towards every other day changes since the drainage seems to be slowing down quite  significantly. 04/14/18 on evaluation today patient appears to be doing well in regard to his bilateral lower extremities. Let me Hilda Blades Almost completely healed which is excellent news. Fortunately he's shown signs of improvement all other sites as well with new skin growth there's some slight hyper granular tissue but for the most part this seems to be well maintained with the [facility] Dressing. I'm very happy in this regard. 04/28/18 on evaluation today patient appears to be doing rather well in regard to his ulcers of the bilateral lower extremities all things considering.  He continues to make some progress as far as new skin growth. There still some hyper granulation noted at this point despite the use of the [facility] Dressing. This is not terrible but I think we may want to consider conclude cauterization today with silver nitrate to try to help knock some of his back as well as helping with any biofilm on the surface of the wound. 05/12/18 on evaluation today patient's wounds actually appear to be doing fairly well in regard to the bilateral lower extremities. He's been tolerating the dressing changes without complication. Fortunately there's no signs of active infection at this time which is good news. Overall very pleased with how things seem to be progressing. You select silver nitrate was beneficial for him. 05/26/18 on evaluation today patient appears to be doing better in regard to left lower extremity and a little bit worse in regard to the right lower extremity. He states that he was pulling off the [facility] Dressing peel back some of the skin making this area significantly larger than what it was previous. He's not had any issues other than this and states even that hasn't caused any pain he just seems to obviously have a much larger area on the right when compared to the previous time I saw him. No fevers, chills, nausea, or vomiting noted at this time. 06/16/18 on  evaluation today patient actually appears to be doing a little better in my pinion in regard to his lower summary ulcers. He has new skin islands that seem to be spreading which is good news. Fortunately there's no signs of active infection at this time. His biggest issue is he tells me that coming as often as he does is becoming very cost prohibitive. He wonders if we can potentially spread this out. 07/14/18 on evaluation today patient appears to be doing a little bit worse in regard to his lower from the ulcer. Unfortunately he still continues to have a significant amount of drainage I think we need to do something to try to help this more. He is still somewhat reluctant to go the route of the Wound VAC although that may be the most appropriate thing for him. No fevers, chills, nausea, or vomiting noted at this time. 08/18/2018 on evaluation today patient actually appears to be doing quite well with regard to his bilateral lower extremity ulcers. I do feel like that currently he is making great progress the care max does seem to be doing a great job at helping to control the moisture he has no maceration or skin breakdown. Again this seems to be an excellent way to go. 1 thing we may want to change is adding collagen to the base of the wound and then the care max over top he is not opposed to this. 09/15/2018 on evaluation today patient appears to be doing well with regard to his bilateral lower extremity ulcers. He is showing some signs of improvement not necessarily in size but definitely in appearance. In fact he has a lot of new skin growing throughout the wounds along the edges as well as in the central portion of the wounds on both lower extremities. Overall I am extremely pleased to see this. 10/20/2018 on evaluation today patient actually appears to be doing quite well with regard to his wounds. They are not measuring significantly smaller but he does have a lot of new epithelization noted as  compared to previous. Fortunately there is no signs of active infection at this time.  No fevers, chills, nausea, vomiting, or diarrhea. 11/17/2018 on evaluation today patient presents for reevaluation concerning his bilateral lower extremity ulcers. Fortunately there is no signs of active infection at this time today. He has been tolerating the dressing changes without complication. No fevers, chills, nausea, vomiting, or diarrhea. Unfortunately in general the patient has not made as much improvement as I would like to have seen up to this point. He has been tolerating the dressing changes without complication and he does an excellent job taking care of his wounds at home in my opinion. The biggest issue I see is that he is just not making the progress that we need to be seeing currently. I think we may want to consider having him seen at a plastic surgery appointment and he has previously seen someone in years past at [facility] in [place]. That is definitely a possibility for [place] to look into at this point. 12/29/2018 on evaluation today patient appears to be doing better in regard to the overall visual appearance of his wounds which do not appear to be as macerated. He does have a much larger skin island in the middle of the left lower extremity ulcer which is doing much better. He tells me the pain is also significantly better. With that being said overall his improvement as far as the size of the wounds is not better but again these are very irregular in change shape quite often. Fortunately there is no evidence of active infection at this time which is great news. He never heard anything from [facility] regarding the plastic surgery referral that we made to them. 01/26/2019 upon evaluation today patient appears to be doing a little better in regard to his wounds today. He has been tolerating the dressing changes again he performs these for the most part on his own. He does a great  job wrapping his legs in my opinion. Unfortunately he has not been able to get down to [facility] to see if there is anything from a plastic surgery standpoint that could be done to help with his legs simply due to the fact that his wife unfortunately sustained a compression fracture in her spine she is seeing Dr. Saintclair Halsted and subsequently is going to be having what sounds to be a kyphoplasty type procedure. With that being said that has not been scheduled yet there is still waiting on an MRI the patient is very busy in fact overly busy trying to help take care of his wife at this point. I completely understand this is more of a strain on him at this time 02/23/2019 upon evaluation today patient actually appears to be making some progress. I am actually very pleased with the overall appearance of his wounds even compared to last evaluation. He seems to be doing quite well. He is taking care of his wife unfortunately she did have a compression fracture she has had the procedure for this but still she has a slow road to recovery. For that reason he still not gone to [facility] for a second opinion in this regard. Obviously the goal there was if there was anything that can be done from a skin graft standpoint or otherwise. 03/23/2019 upon evaluation today patient continues to have issues with lower extremity ulcers. Since the beginning he has made progress but at the same time the wounds unfortunately just will not close. We have been trying to get the patient to [facility] to see a specialist there but  unfortunately with the everything going on with his wife he has not been able to make that appointment time yet he states he may be able to in the next 1-2 months but is not really sure. 04/27/2019 on evaluation today patient appears to be doing a little bit more poorly. His last evaluation. He appears to have some erythema around the edges of the wound at this point. Fortunately there is no  signs of active infection at this time which is good news. No fevers, chills, nausea, vomiting, or diarrhea. 06/08/2019 on evaluation today patient appears to be doing well with regard to his wounds. Overall they are actually measuring smaller compared to the last visit last month. We did treat him for Pseudomonas as well as methicillin-resistant Staph aureus. He was only on the treatment for MRSA however for 7 days as the Cipro was resistant and subsequently we had to place him on doxycycline. Nonetheless I am thinking that we may want to add the doxycycline and just do a month-long treatment considering the longstanding nature of his wounds and see if we get this under better control. Electronic Signature(s) Signed: 06/08/2019 1:24:11 PM By: Worthy Keeler PA-C Entered By: Worthy Keeler on 06/08/2019 13:24:11 -------------------------------------------------------------------------------- Physical Exam Details Patient Name: Date of Service: Carlos White 06/08/2019 12:30 PM Medical Record Number: [id number] Patient Account Number: [id number] Date of Birth/Sex: Treating RN: [birth year] (79 y.o. Carlos White Primary Care Provider: Alton White, [place] RY Other Clinician: Referring Provider: Treating Provider/Extender: Garret Reddish, MA RY Weeks in Treatment: 97 Constitutional Well-nourished and well-hydrated in no acute distress. Respiratory normal breathing without difficulty. Psychiatric this patient is able to make decisions and demonstrates good insight into disease process. Alert and Oriented x 3. pleasant and cooperative. Notes Upon inspection patient's wounds did not appear to be doing too badly overall I am very pleased in that regard. Fortunately there is no signs of infection systemically although locally there may still be some overall superinfection occurring. I think for that reason I would like to have a prolonged course of doxycycline in place to see if we get  this under better control. Electronic Signature(s) Signed: 06/08/2019 1:24:32 PM By: Worthy Keeler PA-C Entered By: Worthy Keeler on 06/08/2019 13:24:32 -------------------------------------------------------------------------------- Physician Orders Details Patient Name: Date of Service: Carlos White RDO [name] 06/08/2019 12:30 PM Medical Record Number: [id number] Patient Account Number: [id number] Date of Birth/Sex: Treating RN: 1940-03-07 (79 y.o. 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 Dressing Change Frequency Wound #2 Left,Distal,Lateral Lower Leg Change Dressing every other day. Wound #5 Right,Proximal,Lateral Lower Leg Change Dressing every other day. Skin Barriers/Peri-Wound Care Barrier cream - to periwound areas Moisturizing lotion - both legs with dressing changes Wound Cleansing Wound #2 Left,Distal,Lateral Lower Leg May shower and wash wound with soap and water. - with dressing changes Wound #5 Right,Proximal,Lateral Lower Leg May shower and wash wound with soap and water. -  with dressing changes Primary Wound Dressing Wound #2 Left,Distal,Lateral Lower Leg Collagen - fibracol - do not need to moisten Wound #5 Right,Proximal,Lateral Lower Leg Collagen - fibracol - do not need to moisten Secondary Dressing Wound #2 Left,Distal,Lateral  Lower Leg Kerlix/Rolled Gauze ABD pad - all wounds Zetuvit or Kerramax Wound #5 Right,Proximal,Lateral Lower Leg Kerlix/Rolled Gauze ABD pad - all wounds Zetuvit or Kerramax Edema Control Avoid standing for long periods of time Elevate legs to the level of the heart or above for 30 minutes daily and/or when sitting, a frequency of: - throughout the day Support Garment 20-30 mm/Hg pressure to: - patient to apply juxtalites daily to both legs over ace wraps daily Other: - ACE wrap both legs Patient Medications llergies: No Known Drug Allergies A Notifications Medication Indication Start End 06/08/2019 doxycycline hyclate DOSE 1 - oral 100 mg capsule - 1 capsule oral taken 2 times per day for 30 days Electronic Signature(s) Signed: 06/08/2019 1:26:12 PM By: Worthy Keeler PA-C Entered By: Worthy Keeler on 06/08/2019 13:26:09 -------------------------------------------------------------------------------- Problem List Details Patient Name: Date of Service: Carlos White RDO [name] 06/08/2019 12:30 PM Medical Record Number: [id number] Patient Account Number: [id number] Date of Birth/Sex: Treating RN: 11-26-1940 (79 y.o. 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 09/30/2017 No Yes L97.322 Non-pressure chronic ulcer of left ankle with fat layer exposed 09/30/2017 No Yes L97.822 Non-pressure chronic ulcer of other part of left lower leg with fat layer exposed9/18/2019 No Yes L97.812 Non-pressure chronic ulcer of other part of right lower leg with fat layer 09/30/2017 No Yes exposed I10 Essential (primary) hypertension 09/30/2017 No Yes Inactive Problems Resolved  Problems Electronic Signature(s) Signed: 06/08/2019 12:40:15 PM By: Worthy Keeler PA-C Entered By: Worthy Keeler on 06/08/2019 12:40:14 -------------------------------------------------------------------------------- Progress Note Details Patient Name: Date of Service: Carlos White RDO [name] 06/08/2019 12:30 PM Medical Record Number: [id number] Patient Account Number: [id number] Date of Birth/Sex: Treating RN: [birth year] (79 y.o. Carlos White Primary Care Provider: Alton White, [place] RY Other Clinician: Referring Provider: Treating Provider/Extender: Garret Reddish, MA RY Weeks in Treatment: 38 Subjective Chief Complaint Information obtained from Patient Bilateral LE Ulcers History of Present Illness (HPI) 09/30/17 on evaluation today patient presents for initial evaluation and our clinic concerning issues that he has been having with his bilateral lower extremities. He states this has been going on for quite some time at least six months. Currently his regiment has been mainly cleaning the area with peroxide, applying the is foreign ointment, and wrapping the area with ABD pads and then an ace wrap loosely. He has dealt with issues of this nature he tells me for quite some time. He does have a history of having had a compound fracture of the left lower extremity which he thinks also makes this a much more difficult area for him to heal. He's previously been told that he had poor vascular flow but this was years ago at [facility] we do not have any of those records at this time. He has a history of Epidermolysis Bullosa which was diagnosed around age 22 and he has been cared for at [facility] since that time. Subsequently he states this is hereditary and two of his children one male  and one male also have this as well is one of his grandchildren that he is aware of. He has no evidence of infection necessarily at this point although he does have some necrotic tissue noted on the surface of the  wound as far as the largest, left lateral lower extremity ulcer, is concerned. Overall I feel like all things considered he's been taking care of this very well. Obviously he has some fairly significant issues going on at this point in this regard. He does have a history otherwise of hypertension though for the most part other than the compound fracture of the left leg he seems to have been fairly healthy in my pinion. 10/07/17 on evaluation today patient actually appears to be doing better in regard to his bilateral lower extremity ulcers. With that being said he does still have some evidence of slough noted on the surface of the wounds I think the Iodoflex has been beneficial for him. His arterial studies are scheduled for October 2. With that being said I do believe that he is continuing to show signs of good improvement which is at least good news. 10/14/17 on evaluation today patient appears to be doing very well in regard to his lower extremity ulcers. He's definitely made some progress as far as healing is concerned although there still are several open areas that are going to need to be addressed. He did have his arterial study today which fortunately shows good findings with a right ABI of 1.23 with a TBI of 0.86 in the left ABI of 1.28 with a TBI of 0.81. This is good news and will allow [place] to perform debridement as well. 10/23/2017; patient with a large wound on the left lateral calf, sizable area on the left medial malleolus and an area on the right lateral malleolus. He has a new blister consistent with his underlying blistering skin disease just above this area we have been using Iodoflex on the lateral left calf lateral right ankle and collagen on the medial left ankle. We have been using Kerlix Coban wraps 10/28/17 on evaluation today the patient continues to have signs of improvement in regard to the overall appearance of the original wound. Unfortunately he did have some blistering  over the right lateral lower extremity which has appeared to rupture on evaluation today and likely some of the dead tissue on the surface needs to be cleaned away the good news is this does not appear to be to significantly deep at this time. 11/04/17 on evaluation today patient actually appears to be doing a little better in regard to his lower extremity ulcers. He has been tolerating the dressing changes without complication. With that being said he does still have a significant wound especially over the left lateral lower extremity unfortunately. All of the wounds pretty much are going to require sharp debridement today. 11/11/17 on evaluation today patient appears to be doing more poorly in regard to his left lower extremity in particular. There does not appear to be any evidence of systemic infection although the wound itself as far as the larger left lateral lower extremity ulcer actually appears to be infected in my pinion. There's an older and the surface of the wound is dramatically worsened compared to last week. No fevers, chills, nausea, or vomiting noted at this time. 11/18/17 upon evaluation today patient actually appears to be doing better. I did review his culture today which really did not show any specific organism is a positive reason for his  wound decline. There are multiple organisms present not predominant. Nonetheless he seems to be tolerate the doxycycline well in his wounds in general do seem to be doing better. Fortunately there does not appear to be any evidence of infection at this time which is good news. Overall I'm very pleased with how things appear. Nonetheless he still has a lot of healing to go. I do think he could benefit from a Juxta-Lite wrap. 11/25/17 on evaluation today patient actually appears to be doing fairly well in regard to his wounds. He is still taking the antibiotics he has a few days left. Fortunately this seems to have been excellent for him as far as  getting the infection control and very happy in this regard. With that being said the patient likewise is also very pleased with how things appear at this time in comparison to where we were he's not having as much pain. 12/02/17 Seen today for follow p and management of LLE wounds. Wounds appear to show some improvement. He denies pain, fever, or chills. Completed a course of doxycycline earlier this month. Scheduled to received Juxta-Lite wrap this week. No s/s of infections. 12/09/17 on evaluation today patient actually appears to be doing a little bit better in regard to his wounds. This is obscene very slow process and unfortunately he has a couple of new areas and this is due to the Epidermolysis Bullosa. Nonetheless I am concerned about the fact that he seems to be getting more areas not less that is the reason we're gonna work on getting schedule for the vascular referral to see the venous specialist. 12/23/17 upon evaluation today patient's wounds currently shows evidence of still not doing quite as well is what I would like to have seen. Subsequently the patient did have his venous study which showed evidence of venous stasis. Subsequently I do think that a vascular evaluation for consideration of venous intervention would be appropriate. I'm not necessarily suggesting that will be anything that can be done but I think it is at least a good idea. He is in agreement with this plan. 12/30/17 on evaluation today patient actually appears to be doing very well in regard to his wounds when compared to previous evaluation. Subsequently we have been using the [facility] Dressing which actually appears to have done excellent on his left lateral lower extremity ulcer. The quality of the wound surface is dramatically improved. There is some slight debridement that is going to be required at a couple of locations but overall I'm extremely pleased with how things appear here. 01/07/2018; this is a  patient with a primary skin disorder epidermolyis bullosa. Is a large wound on the left lateral calf and smaller wounds on the right however there is a new wound on the right mid tibia area that occurred within the compression wrap that he did not change. We have been using Hydrofera Blue. On the left he is using Hydrofera Blue and Santyl to the inferior part of the wound and changing the dressing himself. 01/15/2018; primary skin disorder epidermolysis bullosa. He has several difficult wounds including the left lateral calf, smaller wounds on the left medial calf and the right lateral calf. The major area on the left lateral calf has a smaller area inferiorly that has necrotic debris we have been using Santyl to this. The rest of the wounds we have been using Hydrofera Blue. The area on the left calf actually looks larger this week. Uncontrolled edema several small open areas above  it that are superficial 01/20/18 on evaluation today patient appears to be doing better as compared to last week in regard to his wounds of the bilateral lower extremities. He tolerated the bilateral compression wrap without complication. Overall I'm very pleased with how things appear at this time. The patient likewise is very happy. 01/27/18 on evaluation today patient appears to be doing decently well in regard to his bilateral lower extremity ulcers. He's been tolerating the dressing changes without complication. One issue he had is that he did have more drainage to the left leg wrapped last week. He states in fact he probably should come in and let us change it on Friday however he just left it in place and kept adding extra absorption with ABD pads to the external portion of the wrap. Unfortunately he does have some aspiration type breakdown nothing significant but I do believe that this was probably counterproductive in general. Nonetheless his wounds do not appear to be terrible overall. 02/03/18 on evaluation today  patient appears to be doing rather well in regard to his lower extremity ulcers. He has been tolerating the dressing changes without complication. He does tell me that he had to change the wrap on the left one since we last saw him. Subsequently I do not see any evidence of infection I do feel like the food was much better controlled at this point. 02/10/18 on evaluation today patient appears to be doing rather well in regard to his ulcers. He still has significant alterations especially on the left lateral lower extremity. Fortunately there's no signs of infection at this time. Overall I feel like he is making good progress are some areas that I'm gonna attempt some debridement today. 02/17/18 on evaluation today patient appears to be doing okay in regard to his lower Trinity ulcer. It does appear on both locations he has a little bit of drainage causing some breakdown in maceration around the wound bed's although it doesn't appear to be too bad the right is a little bit worse than left. Fortunately there's no signs of infection which is good news. No fevers, chills, nausea, or vomiting noted at this time. 03/10/18 on evaluation today patient actually appears to be doing rather poorly in regard to his bilateral lower extremity ulcers. The right in particular is draining profusely and the wound is actually enlarging which is not good. I'm concerned about both possibly infection and the fact that there's a lot of moisture which is causing breakdown as well. Unfortunately the patient has been trying to change this at home I'm afraid he may need to change more frequently in order to see the improvement that were looking for. There's no signs of systemic infection. 03/17/18 patient actually appears to be doing significantly better at this point in regard to his bilateral lower extremity ulcers. Fortunately there's no signs of infection. That is worsening infection at least indefinitely nothing systemic. With  that being said he is having a lot of drainage though not quite as much is there in his last evaluation. Overall feel like he's on the side of improvement. I think if his results back from his culture which showed that he had a positive group B strep along with abundant Pseudomonas noted on the culture. For that reason I am gonna have him continue with the linezolid as we previously have ordered for him and I did go ahead as well today and prescribe Levaquin as well in order to treat the Pseudomonas portion of the infection  noted. 03/24/18 on evaluation today patient actually appears to be doing very well in regard to his lower Trinity ulcer. He's been tolerating the dressing changes without complication. Fortunately both legs show signs of less drainage in his edema is very well controlled at this point as well. Overall very pleased with how things seem to be progressing. 03/31/18 on evaluation today patient actually appears to be doing excellent in regard to his bilateral lower extremity ulcers. These are not draining nearly as significant as what they were in the past overall seem to be shown signs of excellent improvement which is great news. Fortunately there is no sign of active infection at this time I do believe that the Levaquin has done extremely well for him in this regard. The patient continues to change these at home typically every day. We may be able to slowly work towards every other day changes since the drainage seems to be slowing down quite significantly. 04/14/18 on evaluation today patient appears to be doing well in regard to his bilateral lower extremities. Let me [facility] Almost completely healed which is excellent news. Fortunately he's shown signs of improvement all other sites as well with new skin growth there's some slight hyper granular tissue but for the most part this seems to be well maintained with the [facility] Dressing. I'm very happy in this regard. 04/28/18 on  evaluation today patient appears to be doing rather well in regard to his ulcers of the bilateral lower extremities all things considering. He continues to make some progress as far as new skin growth. There still some hyper granulation noted at this point despite the use of the [facility] Dressing. This is not terrible but I think we may want to consider conclude cauterization today with silver nitrate to try to help knock some of his back as well as helping with any biofilm on the surface of the wound. 05/12/18 on evaluation today patient's wounds actually appear to be doing fairly well in regard to the bilateral lower extremities. He's been tolerating the dressing changes without complication. Fortunately there's no signs of active infection at this time which is good news. Overall very pleased with how things seem to be progressing. You select silver nitrate was beneficial for him. 05/26/18 on evaluation today patient appears to be doing better in regard to left lower extremity and a little bit worse in regard to the right lower extremity. He states that he was pulling off the [facility] Dressing peel back some of the skin making this area significantly larger than what it was previous. He's not had any issues other than this and states even that hasn't caused any pain he just seems to obviously have a much larger area on the right when compared to the previous time I saw him. No fevers, chills, nausea, or vomiting noted at this time. 06/16/18 on evaluation today patient actually appears to be doing a little better in my pinion in regard to his lower summary ulcers. He has new skin islands that seem to be spreading which is good news. Fortunately there's no signs of active infection at this time. His biggest issue is he tells me that coming as often as he does is becoming very cost prohibitive. He wonders if we can potentially spread this out. 07/14/18 on evaluation today patient appears to be  doing a little bit worse in regard to his lower from the ulcer. Unfortunately he still continues to have a significant amount of drainage I think  we need to do something to try to help this more. He is still somewhat reluctant to go the route of the Wound VAC although that may be the most appropriate thing for him. No fevers, chills, nausea, or vomiting noted at this time. 08/18/2018 on evaluation today patient actually appears to be doing quite well with regard to his bilateral lower extremity ulcers. I do feel like that currently he is making great progress the care max does seem to be doing a great job at helping to control the moisture he has no maceration or skin breakdown. Again this seems to be an excellent way to go. 1 thing we may want to change is adding collagen to the base of the wound and then the care max over top he is not opposed to this. 09/15/2018 on evaluation today patient appears to be doing well with regard to his bilateral lower extremity ulcers. He is showing some signs of improvement not necessarily in size but definitely in appearance. In fact he has a lot of new skin growing throughout the wounds along the edges as well as in the central portion of the wounds on both lower extremities. Overall I am extremely pleased to see this. 10/20/2018 on evaluation today patient actually appears to be doing quite well with regard to his wounds. They are not measuring significantly smaller but he does have a lot of new epithelization noted as compared to previous. Fortunately there is no signs of active infection at this time. No fevers, chills, nausea, vomiting, or diarrhea. 11/17/2018 on evaluation today patient presents for reevaluation concerning his bilateral lower extremity ulcers. Fortunately there is no signs of active infection at this time today. He has been tolerating the dressing changes without complication. No fevers, chills, nausea, vomiting, or diarrhea. Unfortunately in  general the patient has not made as much improvement as I would like to have seen up to this point. He has been tolerating the dressing changes without complication and he does an excellent job taking care of his wounds at home in my opinion. The biggest issue I see is that he is just not making the progress that we need to be seeing currently. I think we may want to consider having him seen at a plastic surgery appointment and he has previously seen someone in years past at [facility] in [place]. That is definitely a possibility for [place] to look into at this point. 12/29/2018 on evaluation today patient appears to be doing better in regard to the overall visual appearance of his wounds which do not appear to be as macerated. He does have a much larger skin island in the middle of the left lower extremity ulcer which is doing much better. He tells me the pain is also significantly better. With that being said overall his improvement as far as the size of the wounds is not better but again these are very irregular in change shape quite often. Fortunately there is no evidence of active infection at this time which is great news. He never heard anything from [facility] regarding the plastic surgery referral that we made to them. 01/26/2019 upon evaluation today patient appears to be doing a little better in regard to his wounds today. He has been tolerating the dressing changes again he performs these for the most part on his own. He does a great job wrapping his legs in my opinion. Unfortunately he has not been able to get down to [facility] to see if there  is anything from a plastic surgery standpoint that could be done to help with his legs simply due to the fact that his wife unfortunately sustained a compression fracture in her spine she is seeing Dr. Saintclair Halsted and subsequently is going to be having what sounds to be a kyphoplasty type procedure. With that being said that has not been  scheduled yet there is still waiting on an MRI the patient is very busy in fact overly busy trying to help take care of his wife at this point. I completely understand this is more of a strain on him at this time 02/23/2019 upon evaluation today patient actually appears to be making some progress. I am actually very pleased with the overall appearance of his wounds even compared to last evaluation. He seems to be doing quite well. He is taking care of his wife unfortunately she did have a compression fracture she has had the procedure for this but still she has a slow road to recovery. For that reason he still not gone to [facility] for a second opinion in this regard. Obviously the goal there was if there was anything that can be done from a skin graft standpoint or otherwise. 03/23/2019 upon evaluation today patient continues to have issues with lower extremity ulcers. Since the beginning he has made progress but at the same time the wounds unfortunately just will not close. We have been trying to get the patient to [facility] to see a specialist there but unfortunately with the everything going on with his wife he has not been able to make that appointment time yet he states he may be able to in the next 1-2 months but is not really sure. 04/27/2019 on evaluation today patient appears to be doing a little bit more poorly. His last evaluation. He appears to have some erythema around the edges of the wound at this point. Fortunately there is no signs of active infection at this time which is good news. No fevers, chills, nausea, vomiting, or diarrhea. 06/08/2019 on evaluation today patient appears to be doing well with regard to his wounds. Overall they are actually measuring smaller compared to the last visit last month. We did treat him for Pseudomonas as well as methicillin-resistant Staph aureus. He was only on the treatment for MRSA however for 7 days as the Cipro was resistant  and subsequently we had to place him on doxycycline. Nonetheless I am thinking that we may want to add the doxycycline and just do a month-long treatment considering the longstanding nature of his wounds and see if we get this under better control. Objective Constitutional Well-nourished and well-hydrated in no acute distress. Vitals Time Taken: 12:35 PM, Height: 71 in, Weight: 220 lbs, BMI: 30.7, Temperature: 97.9 F, Pulse: 63 bpm, Respiratory Rate: 18 breaths/min, Blood Pressure: 150/57 mmHg. Respiratory normal breathing without difficulty. Psychiatric this patient is able to make decisions and demonstrates good insight into disease process. Alert and Oriented x 3. pleasant and cooperative. General Notes: Upon inspection patient's wounds did not appear to be doing too badly overall I am very pleased in that regard. Fortunately there is no signs of infection systemically although locally there may still be some overall superinfection occurring. I think for that reason I would like to have a prolonged course of doxycycline in place to see if we get this under better control. Integumentary (Hair, Skin) Wound #2 status is Open. Original cause of wound was Gradually Appeared. The wound is located on  the Left,Distal,Lateral Lower Leg. The wound measures 16.5cm length x 13.5cm width x 0.1cm depth; 174.947cm^2 area and 17.495cm^3 volume. There is Fat Layer (Subcutaneous Tissue) Exposed exposed. There is no tunneling or undermining noted. There is a large amount of serosanguineous drainage noted. The wound margin is flat and intact. There is large (67-100%) red granulation within the wound bed. There is a small (1-33%) amount of necrotic tissue within the wound bed including Adherent Slough. Wound #5 status is Open. Original cause of wound was Gradually Appeared. The wound is located on the Right,Proximal,Lateral Lower Leg. The wound measures 11cm length x 12cm width x 0.1cm depth; 103.673cm^2 area  and 10.367cm^3 volume. There is Fat Layer (Subcutaneous Tissue) Exposed exposed. There is no tunneling or undermining noted. There is a large amount of serosanguineous drainage noted. The wound margin is flat and intact. There is large (67- 100%) red granulation within the wound bed. There is a small (1-33%) amount of necrotic tissue within the wound bed including Adherent Slough. 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 Dressing Change Frequency: Wound #2 Left,Distal,Lateral Lower Leg: Change Dressing every other day. Wound #5 Right,Proximal,Lateral Lower Leg: Change Dressing every other day. Skin Barriers/Peri-Wound Care: Barrier cream - to periwound areas Moisturizing lotion - both legs with dressing changes Wound Cleansing: Wound #2 Left,Distal,Lateral Lower Leg: May shower and wash wound with soap and water. - with dressing changes Wound #5 Right,Proximal,Lateral Lower Leg: May shower and wash wound with soap and water. - with dressing changes Primary Wound Dressing: Wound #2 Left,Distal,Lateral Lower Leg: Collagen - fibracol - do not need to moisten Wound #5 Right,Proximal,Lateral Lower Leg: Collagen - fibracol - do not need to moisten Secondary Dressing: Wound #2 Left,Distal,Lateral Lower Leg: Kerlix/Rolled Gauze ABD pad - all wounds Zetuvit or Kerramax Wound #5 Right,Proximal,Lateral Lower Leg: Kerlix/Rolled Gauze ABD pad - all wounds Zetuvit or Kerramax Edema Control: Avoid standing for long periods of time Elevate legs to the level of the heart or above for 30 minutes daily and/or  when sitting, a frequency of: - throughout the day Support Garment 20-30 mm/Hg pressure to: - patient to apply juxtalites daily to both legs over ace wraps daily Other: - ACE wrap both legs The following medication(s) was prescribed: doxycycline hyclate oral 100 mg capsule 1 1 capsule oral taken 2 times per day for 30 days starting 06/08/2019 1. I would recommend currently that we go ahead and initiate treatment with a 30-day course of doxycycline. The patient is in agreement with that plan. 2. Were also going to continue with the Fibracol along with the XtraSorb and wraps as we have been utilizing up to this point. The patient seems to be doing well in that regard. 3. I do recommend the patient needs to continue to elevate his legs is much as possible. He noted this as well. 4. My last suggestion for the patient as well was good to be that we go ahead and have him follow-up with [facility] for a second opinion as soon as he can. Unfortunately his wife is not doing well he tells me he is just stretched too thin to be able to go for second opinion at this point. We  will see patient back for reevaluation in 1 month here in the clinic. If anything worsens or changes patient will contact our office for additional recommendations. Electronic Signature(s) Signed: 06/08/2019 1:27:09 PM By: Worthy Keeler PA-C Entered By: Worthy Keeler on 06/08/2019 13:27:08 -------------------------------------------------------------------------------- SuperBill Details Patient Name: Date of Service: Carlos White RDO [name] 06/08/2019 Medical Record Number: [id number] Patient Account Number: [id number] Date of Birth/Sex: Treating RN: 1940-06-24 (79 y.o. [name], [name] Primary Care Provider: Alton White, [place] RY Other Clinician: Referring Provider: Treating Provider/Extender: Garret Reddish, MA RY Weeks in Treatment: 68 Diagnosis Coding ICD-10 Codes Code Description I87.2 Venous insufficiency (chronic)  (peripheral) Q81.8 Other epidermolysis bullosa L97.322 Non-pressure chronic ulcer of left ankle with fat layer exposed L97.822 Non-pressure chronic ulcer of other part of left lower leg with fat layer exposed L97.812 Non-pressure chronic ulcer of other part of right lower leg with fat layer exposed I10 Essential (primary) hypertension Facility Procedures CPT4 Code: [id number] Description: 99214 - WOUND CARE VISIT-LEV 4 EST PT Modifier: Quantity: 1 Physician Procedures : CPT4 Code Description Modifier [id number] - WC PHYS LEVEL 4 - EST PT ICD-10 Diagnosis Description I87.2 Venous insufficiency (chronic) (peripheral) Q81.8 Other epidermolysis bullosa L97.322 Non-pressure chronic ulcer of left ankle with fat layer  exposed L97.822 Non-pressure chronic ulcer of other part of left lower leg with fat layer exposed Quantity: 1 Electronic Signature(s) Signed: 06/08/2019 1:27:23 PM By: Worthy Keeler PA-C Entered By: Worthy Keeler on 06/08/2019 13:27:22

## 2019-06-09 DIAGNOSIS — I87312 Chronic venous hypertension (idiopathic) with ulcer of left lower extremity: Secondary | ICD-10-CM | POA: Diagnosis not present

## 2019-06-10 DIAGNOSIS — I87312 Chronic venous hypertension (idiopathic) with ulcer of left lower extremity: Secondary | ICD-10-CM | POA: Diagnosis not present

## 2019-06-16 NOTE — Progress Notes (Signed)
Carlos White ([id number]) Visit Report for 06/08/2019 Arrival Information Details Patient Name: Date of Service: Carlos White 06/08/2019 12:30 PM Medical Record Number: [id number] Patient Account Number: [id number] Date of Birth/Sex: Treating RN: 05/08/1940 (79 y.o. Carlos White Primary Care Ailed Defibaugh: Alton Revere, [place] RY Other Clinician: Referring Eliam Snapp: Treating Henrique Parekh/Extender: Garret Reddish, MA RY Weeks in Treatment: 24 Visit Information History Since Last Visit Added or deleted any medications: No Patient Arrived: Ambulatory Any new allergies or adverse reactions: No Arrival Time: 12:35 Had a fall or experienced change in No Accompanied By: self activities of daily living that may affect Transfer Assistance: None risk of falls: Patient Identification Verified: Yes Signs or symptoms of abuse/neglect since last visito No Secondary Verification Process Completed: Yes Hospitalized since last visit: No Patient Requires Transmission-Based Precautions: No Implantable device outside of the clinic excluding No Patient Has Alerts: Yes cellular tissue based products placed in the center Patient Alerts: R ABI= 1.23, TBI = .86 since last visit: L ABI= 1.28, TBI=.81 Has Dressing in Place as Prescribed: Yes Pain Present Now: No Electronic Signature(s) Signed: 06/16/2019 9:13:31 AM By: Sandre Kitty Entered By: Sandre Kitty on 06/08/2019 12:35:36 -------------------------------------------------------------------------------- Clinic Level of Care Assessment Details Patient Name: Date of Service: Carlos White 06/08/2019 12:30 PM Medical Record Number: [id number] Patient Account Number: [id number] Date of Birth/Sex: Treating RN: Sep 30, 1940 (79 y.o. Carlos White, [name] Primary Care Carlos White: Alton Revere, [place] RY Other Clinician: Referring Carlos White: Treating Carlos White/Extender: Garret Reddish, MA RY Weeks in Treatment: 61 Clinic Level of Care Assessment  Items TOOL 4 Quantity Score []  - 0 Use when only an EandM is performed on FOLLOW-UP visit ASSESSMENTS - Nursing Assessment / Reassessment X- 1 10 Reassessment of Co-morbidities (includes updates in patient status) X- 1 5 Reassessment of Adherence to Treatment Plan ASSESSMENTS - Wound and Skin A ssessment / Reassessment []  - 0 Simple Wound Assessment / Reassessment - one wound X- 2 5 Complex Wound Assessment / Reassessment - multiple wounds []  - 0 Dermatologic / Skin Assessment (not related to wound area) ASSESSMENTS - Focused Assessment X- 2 5 Circumferential Edema Measurements - multi extremities []  - 0 Nutritional Assessment / Counseling / Intervention X- 1 5 Lower Extremity Assessment (monofilament, tuning fork, pulses) []  - 0 Peripheral Arterial Disease Assessment (using hand held doppler) ASSESSMENTS - Ostomy and/or Continence Assessment and Care []  - 0 Incontinence Assessment and Management []  - 0 Ostomy Care Assessment and Management (repouching, etc.) PROCESS - Coordination of Care X - Simple Patient / Family Education for ongoing care 1 15 []  - 0 Complex (extensive) Patient / Family Education for ongoing care X- 1 10 Staff obtains Programmer, systems, Records, T Results / Process Orders est []  - 0 Staff telephones HHA, Nursing Homes / Clarify orders / etc []  - 0 Routine Transfer to another Facility (non-emergent condition) []  - 0 Routine Hospital Admission (non-emergent condition) []  - 0 New Admissions / Biomedical engineer / Ordering NPWT Apligraf, etc. , []  - 0 Emergency Hospital Admission (emergent condition) X- 1 10 Simple Discharge Coordination []  - 0 Complex (extensive) Discharge Coordination PROCESS - Special Needs []  - 0 Pediatric / Minor Patient Management []  - 0 Isolation Patient Management []  - 0 Hearing / Language / Visual special needs []  - 0 Assessment of Community assistance (transportation, D/C planning, etc.) []  - 0 Additional  assistance / Altered mentation []  - 0 Support Surface(s) Assessment (bed, cushion, seat, etc.) INTERVENTIONS -  Wound Cleansing / Measurement []  - 0 Simple Wound Cleansing - one wound X- 2 5 Complex Wound Cleansing - multiple wounds X- 1 5 Wound Imaging (photographs - any number of wounds) []  - 0 Wound Tracing (instead of photographs) []  - 0 Simple Wound Measurement - one wound X- 2 5 Complex Wound Measurement - multiple wounds INTERVENTIONS - Wound Dressings []  - 0 Small Wound Dressing one or multiple wounds X- 2 15 Medium Wound Dressing one or multiple wounds []  - 0 Large Wound Dressing one or multiple wounds X- 1 5 Application of Medications - topical []  - 0 Application of Medications - injection INTERVENTIONS - Miscellaneous []  - 0 External ear exam []  - 0 Specimen Collection (cultures, biopsies, blood, body fluids, etc.) []  - 0 Specimen(s) / Culture(s) sent or taken to Lab for analysis []  - 0 Patient Transfer (multiple staff / Civil Service fast streamer / Similar devices) []  - 0 Simple Staple / Suture removal (25 or less) []  - 0 Complex Staple / Suture removal (26 or more) []  - 0 Hypo / Hyperglycemic Management (close monitor of Blood Glucose) []  - 0 Ankle / Brachial Index (ABI) - do not check if billed separately X- 1 5 Vital Signs Has the patient been seen at the hospital within the last three years: Yes Total Score: 140 Level Of Care: New/Established - Level 4 Electronic Signature(s) Signed: 06/08/2019 1:35:02 PM By: [name] Entered By: [name] on 06/08/2019 13:21:51 -------------------------------------------------------------------------------- Encounter Discharge Information Details Patient Name: Date of Service: Carlos White, Carlos White. 06/08/2019 12:30 PM Medical Record Number: [id number] Patient Account Number: [id number] Date of Birth/Sex: Treating RN: [birth year] (78 y.o. Carlos White) Carlos White Primary Care Carlos White: Alton Revere, [place] RY Other Clinician: Referring  Carlos White: Treating Jerritt Cardoza/Extender: Garret Reddish, MA RY Weeks in Treatment: 70 Encounter Discharge Information Items Discharge Condition: Stable Ambulatory Status: Ambulatory Discharge Destination: Home Transportation: Private Auto Accompanied By: self Schedule Follow-up Appointment: Yes Clinical Summary of Care: Patient Declined Electronic Signature(s) Signed: 06/10/2019 9:26:18 AM By: Carlos Coria RN Entered By: Carlos White on 06/08/2019 13:46:38 -------------------------------------------------------------------------------- Lower Extremity Assessment Details Patient Name: Date of Service: Carlos White 06/08/2019 12:30 PM Medical Record Number: [id number] Patient Account Number: [id number] Date of Birth/Sex: Treating RN: 01/22/1940 (79 y.o. [name] Primary Care Ahley Bulls: Alton Revere, [place] RY Other Clinician: Referring Francille Wittmann: Treating Amire Leazer/Extender: Garret Reddish, MA RY Weeks in Treatment: 71 Edema Assessment Assessed: [Left: Yes] [Right: Yes] Edema: [Left: Yes] [Right: Yes] Calf Left: Right: Point of Measurement: 33 cm From Medial Instep 37.5 cm 38 cm Ankle Left: Right: Point of Measurement: 12 cm From Medial Instep 30 cm 24 cm Vascular Assessment Pulses: Dorsalis Pedis Palpable: [Left:Yes] [Right:Yes] Electronic Signature(s) Signed: 06/08/2019 1:35:02 PM By: [name] Entered By: [name] on 06/08/2019 12:51:38 -------------------------------------------------------------------------------- Multi-Disciplinary Care Plan Details Patient Name: Date of Service: Carlos White, Carlos White. 06/08/2019 12:30 PM Medical Record Number: [id number] Patient Account Number: [id number] Date of Birth/Sex: Treating RN: 1940/05/18 (79 y.o. [name] Primary Care Christophr Calix: Alton Revere, [place] RY Other Clinician: Referring Jowana Thumma: Treating Gehrig Patras/Extender: Garret Reddish, MA RY Weeks in Treatment: 17 Active Inactive Venous Leg  Ulcer Nursing Diagnoses: Knowledge deficit related to disease process and management Potential for venous Insuffiency (use before diagnosis confirmed) Goals: Patient will maintain optimal edema control Date Initiated: 09/30/2017 Target Resolution Date: 07/06/2019 Goal Status: Active Patient/caregiver will verbalize understanding of disease process and disease management Date  Initiated: 09/30/2017 Date Inactivated: 11/04/2017 Target Resolution Date: 10/30/2017 Goal Status: Met Interventions: Assess peripheral edema status every visit. Provide education on venous insufficiency Notes: Wound/Skin Impairment Nursing Diagnoses: Impaired tissue integrity Knowledge deficit related to ulceration/compromised skin integrity Goals: Patient/caregiver will verbalize understanding of skin care regimen Date Initiated: 09/30/2017 Target Resolution Date: 07/06/2019 Goal Status: Active Ulcer/skin breakdown will have a volume reduction of 30% by week 4 Date Initiated: 09/30/2017 Date Inactivated: 11/04/2017 Target Resolution Date: 10/30/2017 Goal Status: Met Interventions: Assess patient/caregiver ability to obtain necessary supplies Assess patient/caregiver ability to perform ulcer/skin care regimen upon admission and as needed Assess ulceration(s) every visit Provide education on ulcer and skin care Notes: Electronic Signature(s) Signed: 06/08/2019 1:35:02 PM By: [name] Entered By: [name] on 06/08/2019 13:19:29 -------------------------------------------------------------------------------- Pain Assessment Details Patient Name: Date of Service: Carlos White 06/08/2019 12:30 PM Medical Record Number: [id number] Patient Account Number: [id number] Date of Birth/Sex: Treating RN: 1940/06/01 (79 y.o. Carlos White Primary Care Emmani Lesueur: Alton Revere, [place] RY Other Clinician: Referring Kamya Watling: Treating Alfa Leibensperger/Extender: Garret Reddish, MA RY Weeks in Treatment:  28 Active Problems Location of Pain Severity and Description of Pain Patient Has Paino No Site Locations Pain Management and Medication Current Pain Management: Electronic Signature(s) Signed: 06/08/2019 2:12:16 PM By: Baruch Gouty RN, BSN Signed: 06/16/2019 9:13:31 AM By: Sandre Kitty Entered By: Sandre Kitty on 06/08/2019 12:37:43 -------------------------------------------------------------------------------- Patient/Caregiver Education Details Patient Name: Date of Service: Carlos White 5/26/2021andnbsp12:30 PM Medical Record Number: [id number] Patient Account Number: [id number] Date of Birth/Gender: Treating RN: 10/03/1940 (79 y.o. [name] Primary Care Physician: Alton Revere, [place] RY Other Clinician: Referring Physician: Treating Physician/Extender: Garret Reddish, [place] RY Weeks in Treatment: 39 Education Assessment Education Provided To: Patient Education Topics Provided Venous: Methods: Explain/Verbal Responses: Reinforcements needed, State content correctly Wound/Skin Impairment: Methods: Explain/Verbal Responses: Reinforcements needed, State content correctly Electronic Signature(s) Signed: 06/08/2019 1:35:02 PM By: [name] Entered By: [name] on 06/08/2019 13:20:29 -------------------------------------------------------------------------------- Wound Assessment Details Patient Name: Date of Service: [name] RDO Ovidio White. 06/08/2019 12:30 PM Medical Record Number: [id number] Patient Account Number: [id number] Date of Birth/Sex: Treating RN: 10-26-1940 (79 y.o. Carlos White Primary Care Anvith Mauriello: Alton Revere, [place] RY Other Clinician: Referring Bora Bost: Treating Sudeep Scheibel/Extender: Garret Reddish, MA RY Weeks in Treatment: 89 Wound Status Wound Number: 2 Primary Venous Leg Ulcer Etiology: Wound Location: Left, Distal, Lateral Lower Leg Wound Status: Open Wounding Event: Gradually Appeared Comorbid Cataracts,  Chronic Obstructive Pulmonary Disease (COPD), Date Acquired: 03/13/2017 History: Hypertension Weeks Of Treatment: 88 Clustered Wound: No Photos Photo Uploaded By: Mikeal Hawthorne on 06/10/2019 11:34:32 Wound Measurements Length: (cm) 16.5 Width: (cm) 13.5 Depth: (cm) 0.1 Area: (cm) 174.947 Volume: (cm) 17.495 % Reduction in Area: -125.7% % Reduction in Volume: 54.9% Epithelialization: Small (1-33%) Tunneling: No Undermining: No Wound Description Classification: Full Thickness Without Exposed Support Structures Wound Margin: Flat and Intact Exudate Amount: Large Exudate Type: Serosanguineous Exudate Color: red, brown Foul Odor After Cleansing: No Slough/Fibrino Yes Wound Bed Granulation Amount: Large (67-100%) Exposed Structure Granulation Quality: Red Fascia Exposed: No Necrotic Amount: Small (1-33%) Fat Layer (Subcutaneous Tissue) Exposed: Yes Necrotic Quality: Adherent Slough Tendon Exposed: No Muscle Exposed: No Joint Exposed: No Bone Exposed: No Treatment Notes Wound #2 (Left, Distal, Lateral Lower Leg) 1. Cleanse With Wound Cleanser Soap and water 2. Periwound Care Barrier cream 3. Primary Dressing Applied Other primary dressing (specifiy in notes) 4. Secondary Dressing  ABD Pad Roll Gauze 5. Secured With Elastic bandage Notes fibrocal primary dressing , ace wrap with juxatlite over ace Electronic Signature(s) Signed: 06/08/2019 1:35:02 PM By: [name] Signed: 06/08/2019 2:12:16 PM By: Baruch Gouty RN, BSN Entered By: [name] on 06/08/2019 12:52:03 -------------------------------------------------------------------------------- Wound Assessment Details Patient Name: Date of Service: [name] RDO Ovidio White. 06/08/2019 12:30 PM Medical Record Number: [id number] Patient Account Number: [id number] Date of Birth/Sex: Treating RN: [birth year] (79 y.o. Carlos White Primary Care Nial Hawe: Alton Revere, [place] RY Other Clinician: Referring  Melvie Paglia: Treating Zebadiah Willert/Extender: Garret Reddish, MA RY Weeks in Treatment: 33 Wound Status Wound Number: 5 Primary Vasculopathy Etiology: Wound Location: Right, Proximal, Lateral Lower Leg Wound Status: Open Wounding Event: Gradually Appeared Comorbid Cataracts, Chronic Obstructive Pulmonary Disease (COPD), Date Acquired: 10/26/2017 History: Hypertension Weeks Of Treatment: 84 Clustered Wound: No Photos Photo Uploaded By: Mikeal Hawthorne on 06/10/2019 11:34:32 Wound Measurements Length: (cm) 11 Width: (cm) 12 Depth: (cm) 0.1 Area: (cm) 103.673 Volume: (cm) 10.367 % Reduction in Area: -91% % Reduction in Volume: -91% Epithelialization: Small (1-33%) Tunneling: No Undermining: No Wound Description Classification: Full Thickness Without Exposed Support Structures Wound Margin: Flat and Intact Exudate Amount: Large Exudate Type: Serosanguineous Exudate Color: red, brown Foul Odor After Cleansing: No Slough/Fibrino Yes Wound Bed Granulation Amount: Large (67-100%) Exposed Structure Granulation Quality: Red Fascia Exposed: No Necrotic Amount: Small (1-33%) Fat Layer (Subcutaneous Tissue) Exposed: Yes Necrotic Quality: Adherent Slough Tendon Exposed: No Muscle Exposed: No Joint Exposed: No Bone Exposed: No Treatment Notes Wound #5 (Right, Proximal, Lateral Lower Leg) 1. Cleanse With Wound Cleanser Soap and water 2. Periwound Care Barrier cream 3. Primary Dressing Applied Other primary dressing (specifiy in notes) 4. Secondary Dressing ABD Pad Roll Gauze 5. Secured With Elastic bandage Notes fibrocal primary dressing , ace wrap with juxatlite over ace Electronic Signature(s) Signed: 06/08/2019 1:35:02 PM By: [name] Signed: 06/08/2019 2:12:16 PM By: Baruch Gouty RN, BSN Entered By: [name] on 06/08/2019 12:52:17 -------------------------------------------------------------------------------- Vitals Details Patient Name: Date of  Service: Carlos White, Carlos RDO N H. 06/08/2019 12:30 PM Medical Record Number: [id number] Patient Account Number: [id number] Date of Birth/Sex: Treating RN: 1940-05-08 (79 y.o. Carlos White Primary Care Juanjesus Pepperman: Alton Revere, [place] RY Other Clinician: Referring Bernadine Melecio: Treating Jaselyn Nahm/Extender: Garret Reddish, [place] RY Weeks in Treatment: 36 Vital Signs Time Taken: 12:35 Temperature (F): 97.9 Height (in): 71 Pulse (bpm): 63 Weight (lbs): 220 Respiratory Rate (breaths/min): 18 Body Mass Index (BMI): 30.7 Blood Pressure (mmHg): 150/57 Reference Range: 80 - 120 mg / dl Electronic Signature(s) Signed: 06/16/2019 9:13:31 AM By: Sandre Kitty Entered By: Sandre Kitty on 06/08/2019 12:37:37

## 2019-06-21 DIAGNOSIS — H353132 Nonexudative age-related macular degeneration, bilateral, intermediate dry stage: Secondary | ICD-10-CM | POA: Diagnosis not present

## 2019-06-21 DIAGNOSIS — H35033 Hypertensive retinopathy, bilateral: Secondary | ICD-10-CM | POA: Diagnosis not present

## 2019-06-21 LAB — HM DIABETES EYE EXAM

## 2019-06-29 ENCOUNTER — Encounter: Payer: Self-pay | Admitting: Internal Medicine

## 2019-07-06 ENCOUNTER — Encounter (HOSPITAL_BASED_OUTPATIENT_CLINIC_OR_DEPARTMENT_OTHER): Payer: Medicare PPO | Admitting: Physician Assistant

## 2019-07-11 ENCOUNTER — Other Ambulatory Visit: Payer: Self-pay | Admitting: Internal Medicine

## 2019-07-13 ENCOUNTER — Encounter (HOSPITAL_BASED_OUTPATIENT_CLINIC_OR_DEPARTMENT_OTHER): Payer: Medicare PPO | Attending: Physician Assistant | Admitting: Physician Assistant

## 2019-07-13 ENCOUNTER — Other Ambulatory Visit: Payer: Self-pay

## 2019-07-13 DIAGNOSIS — L97812 Non-pressure chronic ulcer of other part of right lower leg with fat layer exposed: Secondary | ICD-10-CM | POA: Insufficient documentation

## 2019-07-13 DIAGNOSIS — L97822 Non-pressure chronic ulcer of other part of left lower leg with fat layer exposed: Secondary | ICD-10-CM | POA: Diagnosis not present

## 2019-07-13 DIAGNOSIS — J449 Chronic obstructive pulmonary disease, unspecified: Secondary | ICD-10-CM | POA: Diagnosis not present

## 2019-07-13 DIAGNOSIS — L97322 Non-pressure chronic ulcer of left ankle with fat layer exposed: Secondary | ICD-10-CM | POA: Insufficient documentation

## 2019-07-13 DIAGNOSIS — Q819 Epidermolysis bullosa, unspecified: Secondary | ICD-10-CM | POA: Insufficient documentation

## 2019-07-13 DIAGNOSIS — I10 Essential (primary) hypertension: Secondary | ICD-10-CM | POA: Diagnosis not present

## 2019-07-13 DIAGNOSIS — I872 Venous insufficiency (chronic) (peripheral): Secondary | ICD-10-CM | POA: Insufficient documentation

## 2019-07-13 DIAGNOSIS — H903 Sensorineural hearing loss, bilateral: Secondary | ICD-10-CM | POA: Diagnosis not present

## 2019-07-13 NOTE — Progress Notes (Addendum)
Carlos, White ([id number]) Visit Report for 07/13/2019 Chief Complaint Document Details Patient Name: Date of Service: Carlos White 07/13/2019 12:30 PM Medical Record Number: [id number] Patient Account Number: [id number] Date of Birth/Sex: Treating RN: 12-May-1940 (79 y.o. Carlos White Primary Care Provider: Alton Revere, [place] RY Other Clinician: Referring Provider: Treating Provider/Extender: Garret Reddish, MA RY Weeks in Treatment: 57 Information Obtained from: Patient Chief Complaint Bilateral LE Ulcers Electronic Signature(s) Signed: 07/13/2019 1:15:42 PM By: Worthy Keeler PA-C Entered By: Worthy Keeler on 07/13/2019 13:15:42 -------------------------------------------------------------------------------- HPI Details Patient Name: Date of Service: Carlos White RDO [name] 07/13/2019 12:30 PM Medical Record Number: [id number] Patient Account Number: [id number] Date of Birth/Sex: Treating RN: 10-18-1940 (79 y.o. Carlos White Primary Care Provider: Alton Revere, [place] R[place] Other Clinician: Referring Provider: Treating Provider/Extender: Garret Reddish, MA RY Weeks in Treatment: 52 History of Present Illness HPI Description: 09/30/17 on evaluation today patient presents for initial evaluation and our clinic concerning issues that he has been having with his bilateral lower extremities. He states this has been going on for quite some time at least six months. Currently his regiment has been mainly cleaning the area with peroxide, applying the is foreign ointment, and wrapping the area with ABD pads and then an ace wrap loosely. He has dealt with issues of this nature he tells me for quite some time. He does have a history of having had a compound fracture of the left lower extremity which he thinks also makes this a much more difficult area for him to heal. He's previously been told that he had poor vascular flow but this was years ago at [facility] we do not have any of  those records at this time. He has a history of Epidermolysis Bullosa which was diagnosed around age 89 and he has been cared for at [facility] since that time. Subsequently he states this is hereditary and two of his children one male and one male also have this as well is one of his grandchildren that he is aware of. He has no evidence of infection necessarily at this point although he does have some necrotic tissue noted on the surface of the wound as far as the largest, left lateral lower extremity ulcer, is concerned. Overall I feel like all things considered he's been taking care of this very well. Obviously he has some fairly significant issues going on at this point in this regard. He does have a history otherwise of hypertension though for the most part other than the compound fracture of the left leg he seems to have been fairly healthy in my pinion. 10/07/17 on evaluation today patient actually appears to be doing better in regard to his bilateral lower extremity ulcers. With that being said he does still have some evidence of slough noted on the surface of the wounds I think the Iodoflex has been beneficial for him. His arterial studies are scheduled for October 2. With that being said I do believe that he is continuing to show signs of good improvement which is at least good news. 10/14/17 on evaluation today patient appears to be doing very well in regard to his lower extremity ulcers. He's definitely made some progress as far as healing is concerned although there still are several open areas that are going to need to be addressed. He did have his arterial study today which fortunately shows good findings with a right ABI of 1.23 with  a TBI of 0.86 in the left ABI of 1.28 with a TBI of 0.81. This is good news and will allow [place] to perform debridement as well. 10/23/2017; patient with a large wound on the left lateral calf, sizable area on the left medial malleolus and an area on the right  lateral malleolus. He has a new blister consistent with his underlying blistering skin disease just above this area we have been using Iodoflex on the lateral left calf lateral right ankle and collagen on the medial left ankle. We have been using Kerlix Coban wraps 10/28/17 on evaluation today the patient continues to have signs of improvement in regard to the overall appearance of the original wound. Unfortunately he did have some blistering over the right lateral lower extremity which has appeared to rupture on evaluation today and likely some of the dead tissue on the surface needs to be cleaned away the good news is this does not appear to be to significantly deep at this time. 11/04/17 on evaluation today patient actually appears to be doing a little better in regard to his lower extremity ulcers. He has been tolerating the dressing changes without complication. With that being said he does still have a significant wound especially over the left lateral lower extremity unfortunately. All of the wounds pretty much are going to require sharp debridement today. 11/11/17 on evaluation today patient appears to be doing more poorly in regard to his left lower extremity in particular. There does not appear to be any evidence of systemic infection although the wound itself as far as the larger left lateral lower extremity ulcer actually appears to be infected in my pinion. There's an older and the surface of the wound is dramatically worsened compared to last week. No fevers, chills, nausea, or vomiting noted at this time. 11/18/17 upon evaluation today patient actually appears to be doing better. I did review his culture today which really did not show any specific organism is a positive reason for his wound decline. There are multiple organisms present not predominant. Nonetheless he seems to be tolerate the doxycycline well in his wounds in general do seem to be doing better. Fortunately there does not  appear to be any evidence of infection at this time which is good news. Overall I'm very pleased with how things appear. Nonetheless he still has a lot of healing to go. I do think he could benefit from a Juxta-Lite wrap. 11/25/17 on evaluation today patient actually appears to be doing fairly well in regard to his wounds. He is still taking the antibiotics he has a few days left. Fortunately this seems to have been excellent for him as far as getting the infection control and very happy in this regard. With that being said the patient likewise is also very pleased with how things appear at this time in comparison to where we were he's not having as much pain. 12/02/17 Seen today for follow p and management of LLE wounds. Wounds appear to show some improvement. He denies pain, fever, or chills. Completed a course of doxycycline earlier this month. Scheduled to received Juxta-Lite wrap this week. No s/s of infections. 12/09/17 on evaluation today patient actually appears to be doing a little bit better in regard to his wounds. This is obscene very slow process and unfortunately he has a couple of new areas and this is due to the Epidermolysis Bullosa. Nonetheless I am concerned about the fact that he seems to be getting more areas not less  that is the reason we're gonna work on getting schedule for the vascular referral to see the venous specialist. 12/23/17 upon evaluation today patient's wounds currently shows evidence of still not doing quite as well is what I would like to have seen. Subsequently the patient did have his venous study which showed evidence of venous stasis. Subsequently I do think that a vascular evaluation for consideration of venous intervention would be appropriate. I'm not necessarily suggesting that will be anything that can be done but I think it is at least a good idea. He is in agreement with this plan. 12/30/17 on evaluation today patient actually appears to be doing very  well in regard to his wounds when compared to previous evaluation. Subsequently we have been using the [facility] Dressing which actually appears to have done excellent on his left lateral lower extremity ulcer. The quality of the wound surface is dramatically improved. There is some slight debridement that is going to be required at a couple of locations but overall I'm extremely pleased with how things appear here. 01/07/2018; this is a patient with a primary skin disorder epidermolyis bullosa. Is a large wound on the left lateral calf and smaller wounds on the right however there is a new wound on the right mid tibia area that occurred within the compression wrap that he did not change. We have been using Hydrofera Blue. On the left he is using Hydrofera Blue and Santyl to the inferior part of the wound and changing the dressing himself. 01/15/2018; primary skin disorder epidermolysis bullosa. He has several difficult wounds including the left lateral calf, smaller wounds on the left medial calf and the right lateral calf. The major area on the left lateral calf has a smaller area inferiorly that has necrotic debris we have been using Santyl to this. The rest of the wounds we have been using Hydrofera Blue. The area on the left calf actually looks larger this week. Uncontrolled edema several small open areas above it that are superficial 01/20/18 on evaluation today patient appears to be doing better as compared to last week in regard to his wounds of the bilateral lower extremities. He tolerated the bilateral compression wrap without complication. Overall I'm very pleased with how things appear at this time. The patient likewise is very happy. 01/27/18 on evaluation today patient appears to be doing decently well in regard to his bilateral lower extremity ulcers. He's been tolerating the dressing changes without complication. One issue he had is that he did have more drainage to the left leg wrapped  last week. He states in fact he probably should come in and let us change it on Friday however he just left it in place and kept adding extra absorption with ABD pads to the external portion of the wrap. Unfortunately he does have some aspiration type breakdown nothing significant but I do believe that this was probably counterproductive in general. Nonetheless his wounds do not appear to be terrible overall. 02/03/18 on evaluation today patient appears to be doing rather well in regard to his lower extremity ulcers. He has been tolerating the dressing changes without complication. He does tell me that he had to change the wrap on the left one since we last saw him. Subsequently I do not see any evidence of infection I do feel like the food was much better controlled at this point. 02/10/18 on evaluation today patient appears to be doing rather well in regard to his ulcers. He still has significant  alterations especially on the left lateral lower extremity. Fortunately there's no signs of infection at this time. Overall I feel like he is making good progress are some areas that I'm gonna attempt some debridement today. 02/17/18 on evaluation today patient appears to be doing okay in regard to his lower Trinity ulcer. It does appear on both locations he has a little bit of drainage causing some breakdown in maceration around the wound bed's although it doesn't appear to be too bad the right is a little bit worse than left. Fortunately there's no signs of infection which is good news. No fevers, chills, nausea, or vomiting noted at this time. 03/10/18 on evaluation today patient actually appears to be doing rather poorly in regard to his bilateral lower extremity ulcers. The right in particular is draining profusely and the wound is actually enlarging which is not good. I'm concerned about both possibly infection and the fact that there's a lot of moisture which is causing breakdown as well. Unfortunately  the patient has been trying to change this at home I'm afraid he may need to change more frequently in order to see the improvement that were looking for. There's no signs of systemic infection. 03/17/18 patient actually appears to be doing significantly better at this point in regard to his bilateral lower extremity ulcers. Fortunately there's no signs of infection. That is worsening infection at least indefinitely nothing systemic. With that being said he is having a lot of drainage though not quite as much is there in his last evaluation. Overall feel like he's on the side of improvement. I think if his results back from his culture which showed that he had a positive group B strep along with abundant Pseudomonas noted on the culture. For that reason I am gonna have him continue with the linezolid as we previously have ordered for him and I did go ahead as well today and prescribe Levaquin as well in order to treat the Pseudomonas portion of the infection noted. 03/24/18 on evaluation today patient actually appears to be doing very well in regard to his lower Trinity ulcer. He's been tolerating the dressing changes without complication. Fortunately both legs show signs of less drainage in his edema is very well controlled at this point as well. Overall very pleased with how things seem to be progressing. 03/31/18 on evaluation today patient actually appears to be doing excellent in regard to his bilateral lower extremity ulcers. These are not draining nearly as significant as what they were in the past overall seem to be shown signs of excellent improvement which is great news. Fortunately there is no sign of active infection at this time I do believe that the Levaquin has done extremely well for him in this regard. The patient continues to change these at home typically every day. We may be able to slowly work towards every other day changes since the drainage seems to be slowing down quite  significantly. 04/14/18 on evaluation today patient appears to be doing well in regard to his bilateral lower extremities. Let me Hilda Blades Almost completely healed which is excellent news. Fortunately he's shown signs of improvement all other sites as well with new skin growth there's some slight hyper granular tissue but for the most part this seems to be well maintained with the [facility] Dressing. I'm very happy in this regard. 04/28/18 on evaluation today patient appears to be doing rather well in regard to his ulcers of the bilateral lower extremities all things considering.  He continues to make some progress as far as new skin growth. There still some hyper granulation noted at this point despite the use of the [facility] Dressing. This is not terrible but I think we may want to consider conclude cauterization today with silver nitrate to try to help knock some of his back as well as helping with any biofilm on the surface of the wound. 05/12/18 on evaluation today patient's wounds actually appear to be doing fairly well in regard to the bilateral lower extremities. He's been tolerating the dressing changes without complication. Fortunately there's no signs of active infection at this time which is good news. Overall very pleased with how things seem to be progressing. You select silver nitrate was beneficial for him. 05/26/18 on evaluation today patient appears to be doing better in regard to left lower extremity and a little bit worse in regard to the right lower extremity. He states that he was pulling off the [facility] Dressing peel back some of the skin making this area significantly larger than what it was previous. He's not had any issues other than this and states even that hasn't caused any pain he just seems to obviously have a much larger area on the right when compared to the previous time I saw him. No fevers, chills, nausea, or vomiting noted at this time. 06/16/18 on  evaluation today patient actually appears to be doing a little better in my pinion in regard to his lower summary ulcers. He has new skin islands that seem to be spreading which is good news. Fortunately there's no signs of active infection at this time. His biggest issue is he tells me that coming as often as he does is becoming very cost prohibitive. He wonders if we can potentially spread this out. 07/14/18 on evaluation today patient appears to be doing a little bit worse in regard to his lower from the ulcer. Unfortunately he still continues to have a significant amount of drainage I think we need to do something to try to help this more. He is still somewhat reluctant to go the route of the Wound VAC although that may be the most appropriate thing for him. No fevers, chills, nausea, or vomiting noted at this time. 08/18/2018 on evaluation today patient actually appears to be doing quite well with regard to his bilateral lower extremity ulcers. I do feel like that currently he is making great progress the care max does seem to be doing a great job at helping to control the moisture he has no maceration or skin breakdown. Again this seems to be an excellent way to go. 1 thing we may want to change is adding collagen to the base of the wound and then the care max over top he is not opposed to this. 09/15/2018 on evaluation today patient appears to be doing well with regard to his bilateral lower extremity ulcers. He is showing some signs of improvement not necessarily in size but definitely in appearance. In fact he has a lot of new skin growing throughout the wounds along the edges as well as in the central portion of the wounds on both lower extremities. Overall I am extremely pleased to see this. 10/20/2018 on evaluation today patient actually appears to be doing quite well with regard to his wounds. They are not measuring significantly smaller but he does have a lot of new epithelization noted as  compared to previous. Fortunately there is no signs of active infection at this time.  No fevers, chills, nausea, vomiting, or diarrhea. 11/17/2018 on evaluation today patient presents for reevaluation concerning his bilateral lower extremity ulcers. Fortunately there is no signs of active infection at this time today. He has been tolerating the dressing changes without complication. No fevers, chills, nausea, vomiting, or diarrhea. Unfortunately in general the patient has not made as much improvement as I would like to have seen up to this point. He has been tolerating the dressing changes without complication and he does an excellent job taking care of his wounds at home in my opinion. The biggest issue I see is that he is just not making the progress that we need to be seeing currently. I think we may want to consider having him seen at a plastic surgery appointment and he has previously seen someone in years past at [facility] in [place]. That is definitely a possibility for [place] to look into at this point. 12/29/2018 on evaluation today patient appears to be doing better in regard to the overall visual appearance of his wounds which do not appear to be as macerated. He does have a much larger skin island in the middle of the left lower extremity ulcer which is doing much better. He tells me the pain is also significantly better. With that being said overall his improvement as far as the size of the wounds is not better but again these are very irregular in change shape quite often. Fortunately there is no evidence of active infection at this time which is great news. He never heard anything from [facility] regarding the plastic surgery referral that we made to them. 01/26/2019 upon evaluation today patient appears to be doing a little better in regard to his wounds today. He has been tolerating the dressing changes again he performs these for the most part on his own. He does a great  job wrapping his legs in my opinion. Unfortunately he has not been able to get down to [facility] to see if there is anything from a plastic surgery standpoint that could be done to help with his legs simply due to the fact that his wife unfortunately sustained a compression fracture in her spine she is seeing Dr. Saintclair Halsted and subsequently is going to be having what sounds to be a kyphoplasty type procedure. With that being said that has not been scheduled yet there is still waiting on an MRI the patient is very busy in fact overly busy trying to help take care of his wife at this point. I completely understand this is more of a strain on him at this time 02/23/2019 upon evaluation today patient actually appears to be making some progress. I am actually very pleased with the overall appearance of his wounds even compared to last evaluation. He seems to be doing quite well. He is taking care of his wife unfortunately she did have a compression fracture she has had the procedure for this but still she has a slow road to recovery. For that reason he still not gone to [facility] for a second opinion in this regard. Obviously the goal there was if there was anything that can be done from a skin graft standpoint or otherwise. 03/23/2019 upon evaluation today patient continues to have issues with lower extremity ulcers. Since the beginning he has made progress but at the same time the wounds unfortunately just will not close. We have been trying to get the patient to [facility] to see a specialist there but  unfortunately with the everything going on with his wife he has not been able to make that appointment time yet he states he may be able to in the next 1-2 months but is not really sure. 04/27/2019 on evaluation today patient appears to be doing a little bit more poorly. His last evaluation. He appears to have some erythema around the edges of the wound at this point. Fortunately there is no  signs of active infection at this time which is good news. No fevers, chills, nausea, vomiting, or diarrhea. 06/08/2019 on evaluation today patient appears to be doing well with regard to his wounds. Overall they are actually measuring smaller compared to the last visit last month. We did treat him for Pseudomonas as well as methicillin-resistant Staph aureus. He was only on the treatment for MRSA however for 7 days as the Cipro was resistant and subsequently we had to place him on doxycycline. Nonetheless I am thinking that we may want to add the doxycycline and just do a month-long treatment considering the longstanding nature of his wounds and see if we get this under better control. 07/13/2019 upon evaluation today patient appears to be doing fairly well in regard to his bilateral lower extremities. There does not appear to be any signs of active infection which is good news. No fevers, chills, nausea, vomiting, or diarrhea. Electronic Signature(s) Signed: 07/13/2019 1:25:56 PM By: Worthy Keeler PA-C Entered By: Worthy Keeler on 07/13/2019 13:25:55 -------------------------------------------------------------------------------- Physical Exam Details Patient Name: Date of Service: Carlos White 07/13/2019 12:30 PM Medical Record Number: [id number] Patient Account Number: [id number] Date of Birth/Sex: Treating RN: [birth year] (79 y.o. Carlos White Primary Care Provider: Alton Revere, [place] R[place] Other Clinician: Referring Provider: Treating Provider/Extender: Garret Reddish, MA RY Weeks in Treatment: 32 Constitutional Well-nourished and well-hydrated in no acute distress. Respiratory normal breathing without difficulty. Psychiatric this patient is able to make decisions and demonstrates good insight into disease process. Alert and Oriented x 3. pleasant and cooperative. Notes Patient's wound bed again showed signs of some scar tissue he does have some new epithelial tissue at  areas which is good news he does not seem to be having the pain he has in the past and overall the drainage is somewhat improved as well. Electronic Signature(s) Signed: 07/13/2019 1:26:10 PM By: Worthy Keeler PA-C Entered By: Worthy Keeler on 07/13/2019 13:26:10 -------------------------------------------------------------------------------- Physician Orders Details Patient Name: Date of Service: Carlos White RDO [name] 07/13/2019 12:30 PM Medical Record Number: [id number] Patient Account Number: [id number] Date of Birth/Sex: Treating RN: 21-Sep-1940 (79 y.o. 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 Dressing Change Frequency Wound #2 Left,Distal,Lateral Lower Leg Change Dressing every other day. Wound #5 Right,Proximal,Lateral Lower Leg Change Dressing every other day. Skin Barriers/Peri-Wound Care Barrier cream - to periwound areas Moisturizing lotion - both legs with dressing changes Wound Cleansing Wound #2 Left,Distal,Lateral Lower Leg May shower and wash wound  with soap and water. - with dressing changes Wound #5 Right,Proximal,Lateral Lower Leg May shower and wash wound with soap and water. - with dressing changes Primary Wound Dressing Wound #2 Left,Distal,Lateral Lower Leg Collagen - fibracol - do not need to moisten Wound #5  Right,Proximal,Lateral Lower Leg Collagen - fibracol - do not need to moisten Secondary Dressing Wound #2 Left,Distal,Lateral Lower Leg Kerlix/Rolled Gauze ABD pad - all wounds Zetuvit or Kerramax Wound #5 Right,Proximal,Lateral Lower Leg Kerlix/Rolled Gauze ABD pad - all wounds Zetuvit or Kerramax Edema Control Avoid standing for long periods of time Elevate legs to the level of the heart or above for 30 minutes daily and/or when sitting, a frequency of: - throughout the day Support Garment 20-30 mm/Hg pressure to: - patient to apply juxtalites daily to both legs over ace wraps daily Other: - ACE wrap both legs Electronic Signature(s) Signed: 07/13/2019 5:49:12 PM By: Baruch Gouty RN, BSN Signed: 07/13/2019 5:59:30 PM By: Worthy Keeler PA-C Entered By: Baruch Gouty on 07/13/2019 13:22:18 -------------------------------------------------------------------------------- Problem List Details Patient Name: Date of Service: [name], [name] 07/13/2019 12:30 PM Medical Record Number: [id number] Patient Account Number: [id number] Date of Birth/Sex: Treating RN: 16-Mar-1940 (78 y.o. 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 09/30/2017 No Yes L97.322 Non-pressure chronic ulcer of left ankle with fat layer exposed 09/30/2017 No Yes L97.822 Non-pressure chronic ulcer of other part of left lower leg with fat layer exposed9/18/2019 No Yes L97.812 Non-pressure chronic ulcer of other part of right lower leg with fat layer 09/30/2017 No Yes exposed I10 Essential (primary) hypertension 09/30/2017 No Yes Inactive Problems Resolved Problems Electronic Signature(s) Signed:  07/13/2019 1:15:35 PM By: Worthy Keeler PA-C Entered By: Worthy Keeler on 07/13/2019 13:15:35 -------------------------------------------------------------------------------- Progress Note Details Patient Name: Date of Service: Carlos White RDO [name] 07/13/2019 12:30 PM Medical Record Number: [id number] Patient Account Number: [id number] Date of Birth/Sex: Treating RN: 04/16/1940 (79 y.o. Carlos White Primary Care Provider: Alton Revere, [place] RY Other Clinician: Referring Provider: Treating Provider/Extender: Garret Reddish, [place] RY Weeks in Treatment: 80 Subjective Chief Complaint Information obtained from Patient Bilateral LE Ulcers History of Present Illness (HPI) 09/30/17 on evaluation today patient presents for initial evaluation and our clinic concerning issues that he has been having with his bilateral lower extremities. He states this has been going on for quite some time at least six months. Currently his regiment has been mainly cleaning the area with peroxide, applying the is foreign ointment, and wrapping the area with ABD pads and then an ace wrap loosely. He has dealt with issues of this nature he tells me for quite some time. He does have a history of having had a compound fracture of the left lower extremity which he thinks also makes this a much more difficult area for him to heal. He's previously been told that he had poor vascular flow but this was years ago at [facility] we do not have any of those records at this time. He has a history of Epidermolysis Bullosa which was diagnosed around age 5 and he has been cared for at [facility] since that time. Subsequently he states this is hereditary and two of his children one male and one male also  have this as well is one of his grandchildren that he is aware of. He has no evidence of infection necessarily at this point although he does have some necrotic tissue noted on the surface of the wound as far as the largest, left lateral  lower extremity ulcer, is concerned. Overall I feel like all things considered he's been taking care of this very well. Obviously he has some fairly significant issues going on at this point in this regard. He does have a history otherwise of hypertension though for the most part other than the compound fracture of the left leg he seems to have been fairly healthy in my pinion. 10/07/17 on evaluation today patient actually appears to be doing better in regard to his bilateral lower extremity ulcers. With that being said he does still have some evidence of slough noted on the surface of the wounds I think the Iodoflex has been beneficial for him. His arterial studies are scheduled for October 2. With that being said I do believe that he is continuing to show signs of good improvement which is at least good news. 10/14/17 on evaluation today patient appears to be doing very well in regard to his lower extremity ulcers. He's definitely made some progress as far as healing is concerned although there still are several open areas that are going to need to be addressed. He did have his arterial study today which fortunately shows good findings with a right ABI of 1.23 with a TBI of 0.86 in the left ABI of 1.28 with a TBI of 0.81. This is good news and will allow [place] to perform debridement as well. 10/23/2017; patient with a large wound on the left lateral calf, sizable area on the left medial malleolus and an area on the right lateral malleolus. He has a new blister consistent with his underlying blistering skin disease just above this area we have been using Iodoflex on the lateral left calf lateral right ankle and collagen on the medial left ankle. We have been using Kerlix Coban wraps 10/28/17 on evaluation today the patient continues to have signs of improvement in regard to the overall appearance of the original wound. Unfortunately he did have some blistering over the right lateral lower extremity which  has appeared to rupture on evaluation today and likely some of the dead tissue on the surface needs to be cleaned away the good news is this does not appear to be to significantly deep at this time. 11/04/17 on evaluation today patient actually appears to be doing a little better in regard to his lower extremity ulcers. He has been tolerating the dressing changes without complication. With that being said he does still have a significant wound especially over the left lateral lower extremity unfortunately. All of the wounds pretty much are going to require sharp debridement today. 11/11/17 on evaluation today patient appears to be doing more poorly in regard to his left lower extremity in particular. There does not appear to be any evidence of systemic infection although the wound itself as far as the larger left lateral lower extremity ulcer actually appears to be infected in my pinion. There's an older and the surface of the wound is dramatically worsened compared to last week. No fevers, chills, nausea, or vomiting noted at this time. 11/18/17 upon evaluation today patient actually appears to be doing better. I did review his culture today which really did not show any specific organism is a positive reason for his wound decline. There are  multiple organisms present not predominant. Nonetheless he seems to be tolerate the doxycycline well in his wounds in general do seem to be doing better. Fortunately there does not appear to be any evidence of infection at this time which is good news. Overall I'm very pleased with how things appear. Nonetheless he still has a lot of healing to go. I do think he could benefit from a Juxta-Lite wrap. 11/25/17 on evaluation today patient actually appears to be doing fairly well in regard to his wounds. He is still taking the antibiotics he has a few days left. Fortunately this seems to have been excellent for him as far as getting the infection control and very happy  in this regard. With that being said the patient likewise is also very pleased with how things appear at this time in comparison to where we were he's not having as much pain. 12/02/17 Seen today for follow p and management of LLE wounds. Wounds appear to show some improvement. He denies pain, fever, or chills. Completed a course of doxycycline earlier this month. Scheduled to received Juxta-Lite wrap this week. No s/s of infections. 12/09/17 on evaluation today patient actually appears to be doing a little bit better in regard to his wounds. This is obscene very slow process and unfortunately he has a couple of new areas and this is due to the Epidermolysis Bullosa. Nonetheless I am concerned about the fact that he seems to be getting more areas not less that is the reason we're gonna work on getting schedule for the vascular referral to see the venous specialist. 12/23/17 upon evaluation today patient's wounds currently shows evidence of still not doing quite as well is what I would like to have seen. Subsequently the patient did have his venous study which showed evidence of venous stasis. Subsequently I do think that a vascular evaluation for consideration of venous intervention would be appropriate. I'm not necessarily suggesting that will be anything that can be done but I think it is at least a good idea. He is in agreement with this plan. 12/30/17 on evaluation today patient actually appears to be doing very well in regard to his wounds when compared to previous evaluation. Subsequently we have been using the [facility] Dressing which actually appears to have done excellent on his left lateral lower extremity ulcer. The quality of the wound surface is dramatically improved. There is some slight debridement that is going to be required at a couple of locations but overall I'm extremely pleased with how things appear here. 01/07/2018; this is a patient with a primary skin disorder  epidermolyis bullosa. Is a large wound on the left lateral calf and smaller wounds on the right however there is a new wound on the right mid tibia area that occurred within the compression wrap that he did not change. We have been using Hydrofera Blue. On the left he is using Hydrofera Blue and Santyl to the inferior part of the wound and changing the dressing himself. 01/15/2018; primary skin disorder epidermolysis bullosa. He has several difficult wounds including the left lateral calf, smaller wounds on the left medial calf and the right lateral calf. The major area on the left lateral calf has a smaller area inferiorly that has necrotic debris we have been using Santyl to this. The rest of the wounds we have been using Hydrofera Blue. The area on the left calf actually looks larger this week. Uncontrolled edema several small open areas above it that are superficial  01/20/18 on evaluation today patient appears to be doing better as compared to last week in regard to his wounds of the bilateral lower extremities. He tolerated the bilateral compression wrap without complication. Overall I'm very pleased with how things appear at this time. The patient likewise is very happy. 01/27/18 on evaluation today patient appears to be doing decently well in regard to his bilateral lower extremity ulcers. He's been tolerating the dressing changes without complication. One issue he had is that he did have more drainage to the left leg wrapped last week. He states in fact he probably should come in and let us change it on Friday however he just left it in place and kept adding extra absorption with ABD pads to the external portion of the wrap. Unfortunately he does have some aspiration type breakdown nothing significant but I do believe that this was probably counterproductive in general. Nonetheless his wounds do not appear to be terrible overall. 02/03/18 on evaluation today patient appears to be doing rather well in  regard to his lower extremity ulcers. He has been tolerating the dressing changes without complication. He does tell me that he had to change the wrap on the left one since we last saw him. Subsequently I do not see any evidence of infection I do feel like the food was much better controlled at this point. 02/10/18 on evaluation today patient appears to be doing rather well in regard to his ulcers. He still has significant alterations especially on the left lateral lower extremity. Fortunately there's no signs of infection at this time. Overall I feel like he is making good progress are some areas that I'm gonna attempt some debridement today. 02/17/18 on evaluation today patient appears to be doing okay in regard to his lower Trinity ulcer. It does appear on both locations he has a little bit of drainage causing some breakdown in maceration around the wound bed's although it doesn't appear to be too bad the right is a little bit worse than left. Fortunately there's no signs of infection which is good news. No fevers, chills, nausea, or vomiting noted at this time. 03/10/18 on evaluation today patient actually appears to be doing rather poorly in regard to his bilateral lower extremity ulcers. The right in particular is draining profusely and the wound is actually enlarging which is not good. I'm concerned about both possibly infection and the fact that there's a lot of moisture which is causing breakdown as well. Unfortunately the patient has been trying to change this at home I'm afraid he may need to change more frequently in order to see the improvement that were looking for. There's no signs of systemic infection. 03/17/18 patient actually appears to be doing significantly better at this point in regard to his bilateral lower extremity ulcers. Fortunately there's no signs of infection. That is worsening infection at least indefinitely nothing systemic. With that being said he is having a lot of drainage  though not quite as much is there in his last evaluation. Overall feel like he's on the side of improvement. I think if his results back from his culture which showed that he had a positive group B strep along with abundant Pseudomonas noted on the culture. For that reason I am gonna have him continue with the linezolid as we previously have ordered for him and I did go ahead as well today and prescribe Levaquin as well in order to treat the Pseudomonas portion of the infection noted. 03/24/18 on evaluation  today patient actually appears to be doing very well in regard to his lower Trinity ulcer. He's been tolerating the dressing changes without complication. Fortunately both legs show signs of less drainage in his edema is very well controlled at this point as well. Overall very pleased with how things seem to be progressing. 03/31/18 on evaluation today patient actually appears to be doing excellent in regard to his bilateral lower extremity ulcers. These are not draining nearly as significant as what they were in the past overall seem to be shown signs of excellent improvement which is great news. Fortunately there is no sign of active infection at this time I do believe that the Levaquin has done extremely well for him in this regard. The patient continues to change these at home typically every day. We may be able to slowly work towards every other day changes since the drainage seems to be slowing down quite significantly. 04/14/18 on evaluation today patient appears to be doing well in regard to his bilateral lower extremities. Let me [facility] Almost completely healed which is excellent news. Fortunately he's shown signs of improvement all other sites as well with new skin growth there's some slight hyper granular tissue but for the most part this seems to be well maintained with the [facility] Dressing. I'm very happy in this regard. 04/28/18 on evaluation today patient appears to be doing  rather well in regard to his ulcers of the bilateral lower extremities all things considering. He continues to make some progress as far as new skin growth. There still some hyper granulation noted at this point despite the use of the [facility] Dressing. This is not terrible but I think we may want to consider conclude cauterization today with silver nitrate to try to help knock some of his back as well as helping with any biofilm on the surface of the wound. 05/12/18 on evaluation today patient's wounds actually appear to be doing fairly well in regard to the bilateral lower extremities. He's been tolerating the dressing changes without complication. Fortunately there's no signs of active infection at this time which is good news. Overall very pleased with how things seem to be progressing. You select silver nitrate was beneficial for him. 05/26/18 on evaluation today patient appears to be doing better in regard to left lower extremity and a little bit worse in regard to the right lower extremity. He states that he was pulling off the [facility] Dressing peel back some of the skin making this area significantly larger than what it was previous. He's not had any issues other than this and states even that hasn't caused any pain he just seems to obviously have a much larger area on the right when compared to the previous time I saw him. No fevers, chills, nausea, or vomiting noted at this time. 06/16/18 on evaluation today patient actually appears to be doing a little better in my pinion in regard to his lower summary ulcers. He has new skin islands that seem to be spreading which is good news. Fortunately there's no signs of active infection at this time. His biggest issue is he tells me that coming as often as he does is becoming very cost prohibitive. He wonders if we can potentially spread this out. 07/14/18 on evaluation today patient appears to be doing a little bit worse in regard to his lower  from the ulcer. Unfortunately he still continues to have a significant amount of drainage I think we need to do  something to try to help this more. He is still somewhat reluctant to go the route of the Wound VAC although that may be the most appropriate thing for him. No fevers, chills, nausea, or vomiting noted at this time. 08/18/2018 on evaluation today patient actually appears to be doing quite well with regard to his bilateral lower extremity ulcers. I do feel like that currently he is making great progress the care max does seem to be doing a great job at helping to control the moisture he has no maceration or skin breakdown. Again this seems to be an excellent way to go. 1 thing we may want to change is adding collagen to the base of the wound and then the care max over top he is not opposed to this. 09/15/2018 on evaluation today patient appears to be doing well with regard to his bilateral lower extremity ulcers. He is showing some signs of improvement not necessarily in size but definitely in appearance. In fact he has a lot of new skin growing throughout the wounds along the edges as well as in the central portion of the wounds on both lower extremities. Overall I am extremely pleased to see this. 10/20/2018 on evaluation today patient actually appears to be doing quite well with regard to his wounds. They are not measuring significantly smaller but he does have a lot of new epithelization noted as compared to previous. Fortunately there is no signs of active infection at this time. No fevers, chills, nausea, vomiting, or diarrhea. 11/17/2018 on evaluation today patient presents for reevaluation concerning his bilateral lower extremity ulcers. Fortunately there is no signs of active infection at this time today. He has been tolerating the dressing changes without complication. No fevers, chills, nausea, vomiting, or diarrhea. Unfortunately in general the patient has not made as much improvement  as I would like to have seen up to this point. He has been tolerating the dressing changes without complication and he does an excellent job taking care of his wounds at home in my opinion. The biggest issue I see is that he is just not making the progress that we need to be seeing currently. I think we may want to consider having him seen at a plastic surgery appointment and he has previously seen someone in years past at [facility] in [place]. That is definitely a possibility for [place] to look into at this point. 12/29/2018 on evaluation today patient appears to be doing better in regard to the overall visual appearance of his wounds which do not appear to be as macerated. He does have a much larger skin island in the middle of the left lower extremity ulcer which is doing much better. He tells me the pain is also significantly better. With that being said overall his improvement as far as the size of the wounds is not better but again these are very irregular in change shape quite often. Fortunately there is no evidence of active infection at this time which is great news. He never heard anything from [facility] regarding the plastic surgery referral that we made to them. 01/26/2019 upon evaluation today patient appears to be doing a little better in regard to his wounds today. He has been tolerating the dressing changes again he performs these for the most part on his own. He does a great job wrapping his legs in my opinion. Unfortunately he has not been able to get down to [facility] to see if there is anything from a  plastic surgery standpoint that could be done to help with his legs simply due to the fact that his wife unfortunately sustained a compression fracture in her spine she is seeing Dr. Saintclair Halsted and subsequently is going to be having what sounds to be a kyphoplasty type procedure. With that being said that has not been scheduled yet there is still waiting on an MRI the patient is  very busy in fact overly busy trying to help take care of his wife at this point. I completely understand this is more of a strain on him at this time 02/23/2019 upon evaluation today patient actually appears to be making some progress. I am actually very pleased with the overall appearance of his wounds even compared to last evaluation. He seems to be doing quite well. He is taking care of his wife unfortunately she did have a compression fracture she has had the procedure for this but still she has a slow road to recovery. For that reason he still not gone to [facility] for a second opinion in this regard. Obviously the goal there was if there was anything that can be done from a skin graft standpoint or otherwise. 03/23/2019 upon evaluation today patient continues to have issues with lower extremity ulcers. Since the beginning he has made progress but at the same time the wounds unfortunately just will not close. We have been trying to get the patient to [facility] to see a specialist there but unfortunately with the everything going on with his wife he has not been able to make that appointment time yet he states he may be able to in the next 1-2 months but is not really sure. 04/27/2019 on evaluation today patient appears to be doing a little bit more poorly. His last evaluation. He appears to have some erythema around the edges of the wound at this point. Fortunately there is no signs of active infection at this time which is good news. No fevers, chills, nausea, vomiting, or diarrhea. 06/08/2019 on evaluation today patient appears to be doing well with regard to his wounds. Overall they are actually measuring smaller compared to the last visit last month. We did treat him for Pseudomonas as well as methicillin-resistant Staph aureus. He was only on the treatment for MRSA however for 7 days as the Cipro was resistant and subsequently we had to place him on doxycycline.  Nonetheless I am thinking that we may want to add the doxycycline and just do a month-long treatment considering the longstanding nature of his wounds and see if we get this under better control. 07/13/2019 upon evaluation today patient appears to be doing fairly well in regard to his bilateral lower extremities. There does not appear to be any signs of active infection which is good news. No fevers, chills, nausea, vomiting, or diarrhea. Objective Constitutional Well-nourished and well-hydrated in no acute distress. Vitals Time Taken: 12:55 PM, Height: 71 in, Weight: 220 lbs, BMI: 30.7, Temperature: 98.2 F, Pulse: 89 bpm, Respiratory Rate: 18 breaths/min, Blood Pressure: 160/53 mmHg. Respiratory normal breathing without difficulty. Psychiatric this patient is able to make decisions and demonstrates good insight into disease process. Alert and Oriented x 3. pleasant and cooperative. General Notes: Patient's wound bed again showed signs of some scar tissue he does have some new epithelial tissue at areas which is good news he does not seem to be having the pain he has in the past and overall the drainage is somewhat improved as well. Integumentary (Hair,  Skin) Wound #2 status is Open. Original cause of wound was Gradually Appeared. The wound is located on the Left,Distal,Lateral Lower Leg. The wound measures 15.6cm length x 13.5cm width x 0.1cm depth; 165.405cm^2 area and 16.54cm^3 volume. There is Fat Layer (Subcutaneous Tissue) Exposed exposed. There is no tunneling or undermining noted. There is a large amount of serosanguineous drainage noted. The wound margin is flat and intact. There is large (67-100%) red granulation within the wound bed. There is a small (1-33%) amount of necrotic tissue within the wound bed including Adherent Slough. Wound #5 status is Open. Original cause of wound was Gradually Appeared. The wound is located on the Right,Proximal,Lateral Lower Leg. The wound measures  17.5cm length x 17cm width x 0.1cm depth; 233.656cm^2 area and 23.366cm^3 volume. There is Fat Layer (Subcutaneous Tissue) Exposed exposed. There is no tunneling or undermining noted. There is a large amount of serosanguineous drainage noted. The wound margin is flat and intact. There is large (67-100%) red granulation within the wound bed. There is a small (1-33%) amount of necrotic tissue within the wound bed including Adherent Slough. 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 Dressing Change Frequency: Wound #2 Left,Distal,Lateral Lower Leg: Change Dressing every other day. Wound #5 Right,Proximal,Lateral Lower Leg: Change Dressing every other day. Skin Barriers/Peri-Wound Care: Barrier cream - to periwound areas Moisturizing lotion - both legs with dressing changes Wound Cleansing: Wound #2 Left,Distal,Lateral Lower Leg: May shower and wash wound with soap and water. - with dressing changes Wound #5 Right,Proximal,Lateral Lower Leg: May shower and wash wound with soap and water. - with dressing changes Primary Wound Dressing: Wound #2 Left,Distal,Lateral Lower Leg: Collagen - fibracol - do not need to moisten Wound #5 Right,Proximal,Lateral Lower Leg: Collagen - fibracol - do not need to moisten Secondary Dressing: Wound #2 Left,Distal,Lateral Lower Leg: Kerlix/Rolled Gauze ABD pad - all wounds Zetuvit or Kerramax Wound #5 Right,Proximal,Lateral Lower Leg: Kerlix/Rolled Gauze ABD pad - all wounds Zetuvit or Kerramax Edema Control: Avoid standing for long periods of time Elevate legs to the  level of the heart or above for 30 minutes daily and/or when sitting, a frequency of: - throughout the day Support Garment 20-30 mm/Hg pressure to: - patient to apply juxtalites daily to both legs over ace wraps daily Other: - ACE wrap both legs 1. I am going to suggest that we go ahead and continue with the wound care measures as before with regard to the collagen followed by the XtraSorb. He is also using his juxta lite wraps at this time. 2. I am also can recommend that he continue to monitor for any signs of infection obviously if he develops anything that he is concerned about he should definitely let me know but at the same time we will otherwise plan to see him on the regular set schedule monthly. We will see patient back for reevaluation in 4 weeks here in the clinic. If anything worsens or changes patient will contact our office for additional recommendations. Electronic Signature(s) Signed: 07/13/2019 1:27:41 PM By: Worthy Keeler PA-C Entered By: Worthy Keeler on 07/13/2019 13:27:40 -------------------------------------------------------------------------------- SuperBill Details Patient Name: Date of Service: [name], GO RDO N H. 07/13/2019  Medical Record Number: [id number] Patient Account Number: [id number] Date of Birth/Sex: Treating RN: 09/21/1940 (79 y.o. Carlos White Primary Care Provider: Alton Revere, [place] RY Other Clinician: Referring Provider: Treating Provider/Extender: Garret Reddish, MA RY Weeks in Treatment: 59 Diagnosis Coding ICD-10 Codes Code Description I87.2 Venous insufficiency (chronic) (peripheral) Q81.8 Other epidermolysis bullosa L97.322 Non-pressure chronic ulcer of left ankle with fat layer exposed L97.822 Non-pressure chronic ulcer of other part of left lower leg with fat layer exposed L97.812 Non-pressure chronic ulcer of other part of right lower leg with fat layer exposed I10 Essential (primary) hypertension Facility Procedures CPT4  Code: 13086578 Description: 99214 - WOUND CARE VISIT-LEV 4 EST PT Modifier: Quantity: 1 Physician Procedures : CPT4 Code Description Modifier [id number] 28413 - WC PHYS LEVEL 3 - EST PT ICD-10 Diagnosis Description I87.2 Venous insufficiency (chronic) (peripheral) Q81.8 Other epidermolysis bullosa L97.322 Non-pressure chronic ulcer of left ankle with fat layer  exposed L97.822 Non-pressure chronic ulcer of other part of left lower leg with fat layer exposed Quantity: 1 Electronic Signature(s) Signed: 07/13/2019 1:27:55 PM By: Worthy Keeler PA-C Entered By: Worthy Keeler on 07/13/2019 13:27:55

## 2019-07-13 NOTE — Progress Notes (Signed)
Carlos White, Carlos White ([id number]) Visit Report for 07/13/2019 Arrival Information Details Patient Name: Date of Service: Carlos White 07/13/2019 12:30 PM Medical Record Number: [id number] Patient Account Number: [id number] Date of Birth/Sex: Treating RN: 12/07/1940 (79 y.o. Carlos White Primary Care Carlethia Mesquita: Carlos Revere, [place] RY Other Clinician: Referring Marthena White: Treating Carlos White/Extender: Garret Reddish, MA RY Weeks in Treatment: 10 Visit Information History Since Last Visit Added or deleted any medications: No Patient Arrived: Ambulatory Any new allergies or adverse reactions: No Arrival Time: 12:56 Had a fall or experienced change in No Accompanied By: self activities of daily living that may affect Transfer Assistance: None risk of falls: Patient Identification Verified: Yes Signs or symptoms of abuse/neglect since last visito No Secondary Verification Process Completed: Yes Hospitalized since last visit: No Patient Requires Transmission-Based Precautions: No Implantable device outside of the clinic excluding No Patient Has Alerts: Yes cellular tissue based products placed in the center Patient Alerts: R ABI= 1.23, TBI = .86 since last visit: L ABI= 1.28, TBI=.81 Has Dressing in Place as Prescribed: Yes Pain Present Now: No Electronic Signature(s) Signed: 07/13/2019 5:29:07 PM By: Kela Millin Entered By: Kela Millin on 07/13/2019 12:56:33 -------------------------------------------------------------------------------- Clinic Level of Care Assessment Details Patient Name: Date of Service: Carlos White 07/13/2019 12:30 PM Medical Record Number: [id number] Patient Account Number: [id number] Date of Birth/Sex: Treating RN: 03-Nov-1940 (79 y.o. Carlos White Primary Care Carlos White: Carlos White, [place] RY Other Clinician: Referring Carlos White: Treating Javoni Lucken/Extender: Garret Reddish, MA RY Weeks in Treatment: 33 Clinic Level of Care  Assessment Items TOOL 4 Quantity Score []  - 0 Use when only an EandM is performed on FOLLOW-UP visit ASSESSMENTS - Nursing Assessment / Reassessment X- 1 10 Reassessment of Co-morbidities (includes updates in patient status) X- 1 5 Reassessment of Adherence to Treatment Plan ASSESSMENTS - Wound and Skin A ssessment / Reassessment []  - 0 Simple Wound Assessment / Reassessment - one wound X- 2 5 Complex Wound Assessment / Reassessment - multiple wounds []  - 0 Dermatologic / Skin Assessment (not related to wound area) ASSESSMENTS - Focused Assessment X- 2 5 Circumferential Edema Measurements - multi extremities []  - 0 Nutritional Assessment / Counseling / Intervention X- 1 5 Lower Extremity Assessment (monofilament, tuning fork, pulses) []  - 0 Peripheral Arterial Disease Assessment (using hand held doppler) ASSESSMENTS - Ostomy and/or Continence Assessment and Care []  - 0 Incontinence Assessment and Management []  - 0 Ostomy Care Assessment and Management (repouching, etc.) PROCESS - Coordination of Care X - Simple Patient / Family Education for ongoing care 1 15 []  - 0 Complex (extensive) Patient / Family Education for ongoing care X- 1 10 Staff obtains Programmer, systems, Records, T Results / Process Orders est []  - 0 Staff telephones HHA, Nursing Homes / Clarify orders / etc []  - 0 Routine Transfer to another Facility (non-emergent condition) []  - 0 Routine Hospital Admission (non-emergent condition) []  - 0 New Admissions / Biomedical engineer / Ordering NPWT Apligraf, etc. , []  - 0 Emergency Hospital Admission (emergent condition) X- 1 10 Simple Discharge Coordination []  - 0 Complex (extensive) Discharge Coordination PROCESS - Special Needs []  - 0 Pediatric / Minor Patient Management []  - 0 Isolation Patient Management []  - 0 Hearing / Language / Visual special needs []  - 0 Assessment of Community assistance (transportation, D/C planning, etc.) []  -  0 Additional assistance / Altered mentation []  - 0 Support Surface(s) Assessment (bed, cushion, seat, etc.) INTERVENTIONS -  Wound Cleansing / Measurement []  - 0 Simple Wound Cleansing - one wound X- 2 5 Complex Wound Cleansing - multiple wounds X- 1 5 Wound Imaging (photographs - any number of wounds) []  - 0 Wound Tracing (instead of photographs) []  - 0 Simple Wound Measurement - one wound X- 2 5 Complex Wound Measurement - multiple wounds INTERVENTIONS - Wound Dressings []  - 0 Small Wound Dressing one or multiple wounds X- 2 15 Medium Wound Dressing one or multiple wounds []  - 0 Large Wound Dressing one or multiple wounds X- 1 5 Application of Medications - topical []  - 0 Application of Medications - injection INTERVENTIONS - Miscellaneous []  - 0 External ear exam []  - 0 Specimen Collection (cultures, biopsies, blood, body fluids, etc.) []  - 0 Specimen(s) / Culture(s) sent or taken to Lab for analysis []  - 0 Patient Transfer (multiple staff / Civil Service fast streamer / Similar devices) []  - 0 Simple Staple / Suture removal (25 or less) []  - 0 Complex Staple / Suture removal (26 or more) []  - 0 Hypo / Hyperglycemic Management (close monitor of Blood Glucose) []  - 0 Ankle / Brachial Index (ABI) - do not check if billed separately X- 1 5 Vital Signs Has the patient been seen at the hospital within the last three years: Yes Total Score: 140 Level Of Care: New/Established - Level 4 Electronic Signature(s) Signed: 07/13/2019 5:49:12 PM By: Baruch Gouty RN, BSN Entered By: Baruch Gouty on 07/13/2019 13:27:26 -------------------------------------------------------------------------------- Lower Extremity Assessment Details Patient Name: Date of Service: [name] RDO Carlos White. 07/13/2019 12:30 PM Medical Record Number: [id number] Patient Account Number: [id number] Date of Birth/Sex: Treating RN: Apr 22, 1940 (79 y.o. Carlos White Primary Care Kaho Selle: Carlos Revere, [place] RY Other  Clinician: Referring Ajanee Buren: Treating Levan Aloia/Extender: Garret Reddish, MA RY Weeks in Treatment: 68 Edema Assessment Assessed: [Left: No] [Right: No] Edema: [Left: Yes] [Right: Yes] Calf Left: Right: Point of Measurement: 33 cm From Medial Instep 38 cm 36 cm Ankle Left: Right: Point of Measurement: 12 cm From Medial Instep 31 cm 30 cm Vascular Assessment Pulses: Dorsalis Pedis Palpable: [Left:Yes] [Right:Yes] Electronic Signature(s) Signed: 07/13/2019 5:29:07 PM By: Kela Millin Entered By: Kela Millin on 07/13/2019 12:59:52 -------------------------------------------------------------------------------- Multi-Disciplinary Care Plan Details Patient Name: Date of Service: [name], GO RDO Carlos White. 07/13/2019 12:30 PM Medical Record Number: [id number] Patient Account Number: [id number] Date of Birth/Sex: Treating RN: [birth year] (79 y.o. Carlos White Primary Care Chijioke Lasser: Carlos White, [place] RY Other Clinician: Referring Liala Codispoti: Treating Alea Ryer/Extender: Garret Reddish, MA RY Weeks in Treatment: 30 Active Inactive Venous Leg Ulcer Nursing Diagnoses: Knowledge deficit related to disease process and management Potential for venous Insuffiency (use before diagnosis confirmed) Goals: Patient will maintain optimal edema control Date Initiated: 09/30/2017 Target Resolution Date: 08/03/2019 Goal Status: Active Patient/caregiver will verbalize understanding of disease process and disease management Date Initiated: 09/30/2017 Date Inactivated: 11/04/2017 Target Resolution Date: 10/30/2017 Goal Status: Met Interventions: Assess peripheral edema status every visit. Provide education on venous insufficiency Notes: Wound/Skin Impairment Nursing Diagnoses: Impaired tissue integrity Knowledge deficit related to ulceration/compromised skin integrity Goals: Patient/caregiver will verbalize understanding of skin care regimen Date Initiated: 09/30/2017 Target  Resolution Date: 08/03/2019 Goal Status: Active Ulcer/skin breakdown will have a volume reduction of 30% by week 4 Date Initiated: 09/30/2017 Date Inactivated: 11/04/2017 Target Resolution Date: 10/30/2017 Goal Status: Met Interventions: Assess patient/caregiver ability to obtain necessary supplies Assess patient/caregiver ability to perform ulcer/skin care regimen upon admission and as needed Assess ulceration(s)  every visit Provide education on ulcer and skin care Notes: Electronic Signature(s) Signed: 07/13/2019 5:49:12 PM By: Baruch Gouty RN, BSN Entered By: Baruch Gouty on 07/13/2019 13:18:07 -------------------------------------------------------------------------------- Pain Assessment Details Patient Name: Date of Service: [name] RDO Carlos White. 07/13/2019 12:30 PM Medical Record Number: [id number] Patient Account Number: [id number] Date of Birth/Sex: Treating RN: September 03, 1940 (79 y.o. Carlos White Primary Care Roselind Klus: Carlos White, [place] RY Other Clinician: Referring Christin Moline: Treating Gisell Buehrle/Extender: Garret Reddish, MA RY Weeks in Treatment: 67 Active Problems Location of Pain Severity and Description of Pain Patient Has Paino No Site Locations Pain Management and Medication Current Pain Management: Electronic Signature(s) Signed: 07/13/2019 5:29:07 PM By: Kela Millin Entered By: Kela Millin on 07/13/2019 12:58:40 -------------------------------------------------------------------------------- Patient/Caregiver Education Details Patient Name: Date of Service: Carlos White 6/30/2021andnbsp12:30 PM Medical Record Number: [id number] Patient Account Number: [id number] Date of Birth/Gender: Treating RN: October 19, 1940 (79 y.o. Carlos White Primary Care Physician: Carlos White, [place] RY Other Clinician: Referring Physician: Treating Physician/Extender: Garret Reddish, [place] RY Weeks in Treatment: 66 Education Assessment Education  Provided To: Patient Education Topics Provided Venous: Methods: Explain/Verbal Responses: Reinforcements needed, State content correctly Wound/Skin Impairment: Methods: Explain/Verbal Responses: Reinforcements needed, State content correctly Electronic Signature(s) Signed: 07/13/2019 5:49:12 PM By: Baruch Gouty RN, BSN Entered By: Baruch Gouty on 07/13/2019 13:18:29 -------------------------------------------------------------------------------- Wound Assessment Details Patient Name: Date of Service: [name] RDO Carlos White. 07/13/2019 12:30 PM Medical Record Number: [id number] Patient Account Number: [id number] Date of Birth/Sex: Treating RN: 1940-12-16 (79 y.o. Carlos White Primary Care Chloeann Alfred: Carlos Revere, [place] RY Other Clinician: Referring Wyman Meschke: Treating Terique Kawabata/Extender: Garret Reddish, [place] RY Weeks in Treatment: 21 Wound Status Wound Number: 2 Primary Venous Leg Ulcer Etiology: Wound Location: Left, Distal, Lateral Lower Leg Wound Status: Open Wounding Event: Gradually Appeared Comorbid Cataracts, Chronic Obstructive Pulmonary Disease (COPD), Date Acquired: 03/13/2017 History: Hypertension Weeks Of Treatment: 93 Clustered Wound: No Wound Measurements Length: (cm) 15.6 Width: (cm) 13.5 Depth: (cm) 0.1 Area: (cm) 165.405 Volume: (cm) 16.54 % Reduction in Area: -113.4% % Reduction in Volume: 57.3% Epithelialization: Small (1-33%) Tunneling: No Undermining: No Wound Description Classification: Full Thickness Without Exposed Support Structures Wound Margin: Flat and Intact Exudate Amount: Large Exudate Type: Serosanguineous Exudate Color: red, brown Foul Odor After Cleansing: No Slough/Fibrino Yes Wound Bed Granulation Amount: Large (67-100%) Exposed Structure Granulation Quality: Red Fascia Exposed: No Necrotic Amount: Small (1-33%) Fat Layer (Subcutaneous Tissue) Exposed: Yes Necrotic Quality: Adherent Slough Tendon Exposed: No Muscle  Exposed: No Joint Exposed: No Bone Exposed: No Treatment Notes Wound #2 (Left, Distal, Lateral Lower Leg) 1. Cleanse With Wound Cleanser Soap and water 3. Primary Dressing Applied Other primary dressing (specifiy in notes) 4. Secondary Dressing ABD Pad Roll Gauze 6. Support Layer Education officer, community) Signed: 07/13/2019 5:29:07 PM By: Kela Millin Entered By: Kela Millin on 07/13/2019 13:03:27 -------------------------------------------------------------------------------- Wound Assessment Details Patient Name: Date of Service: Carlos White 07/13/2019 12:30 PM Medical Record Number: [id number] Patient Account Number: [id number] Date of Birth/Sex: Treating RN: [birth year] (79 y.o. Carlos White Primary Care Almus Woodham: Carlos White, [place] RY Other Clinician: Referring Jorgia Manthei: Treating Maccoy Haubner/Extender: Garret Reddish, [place] RY Weeks in Treatment: 15 Wound Status Wound Number: 5 Primary Vasculopathy Etiology: Wound Location: Right, Proximal, Lateral Lower Leg Wound Status: Open Wounding Event: Gradually Appeared Comorbid Cataracts, Chronic Obstructive Pulmonary Disease (COPD), Date Acquired: 10/26/2017 History: Hypertension Weeks Of Treatment: 89  Clustered Wound: Yes Wound Measurements Length: (cm) 17.5 Width: (cm) 17 Depth: (cm) 0.1 Clustered Quantity: 2 Area: (cm) 233.656 Volume: (cm) 23.366 % Reduction in Area: -330.4% % Reduction in Volume: -330.4% Epithelialization: Small (1-33%) Tunneling: No Undermining: No Wound Description Classification: Full Thickness Without Exposed Support Structures Wound Margin: Flat and Intact Exudate Amount: Large Exudate Type: Serosanguineous Exudate Color: red, brown Foul Odor After Cleansing: No Slough/Fibrino Yes Wound Bed Granulation Amount: Large (67-100%) Exposed Structure Granulation Quality: Red Fascia Exposed: No Necrotic Amount: Small (1-33%) Fat Layer (Subcutaneous  Tissue) Exposed: Yes Necrotic Quality: Adherent Slough Tendon Exposed: No Muscle Exposed: No Joint Exposed: No Bone Exposed: No Treatment Notes Wound #5 (Right, Proximal, Lateral Lower Leg) 1. Cleanse With Wound Cleanser Soap and water 3. Primary Dressing Applied Other primary dressing (specifiy in notes) 4. Secondary Dressing ABD Pad Roll Gauze 6. Support Layer Education officer, community) Signed: 07/13/2019 5:29:07 PM By: Kela Millin Entered By: Kela Millin on 07/13/2019 13:04:40 -------------------------------------------------------------------------------- Vitals Details Patient Name: Date of Service: [name], GO RDO N H. 07/13/2019 12:30 PM Medical Record Number: [id number] Patient Account Number: [id number] Date of Birth/Sex: Treating RN: 06/22/1940 (79 y.o. Carlos White Primary Care Shadeed Colberg: Carlos Revere, [place] RY Other Clinician: Referring Shaheem Pichon: Treating Sydney Azure/Extender: Garret Reddish, [place] RY Weeks in Treatment: 59 Vital Signs Time Taken: 12:55 Temperature (F): 98.2 Height (in): 71 Pulse (bpm): 89 Weight (lbs): 220 Respiratory Rate (breaths/min): 18 Body Mass Index (BMI): 30.7 Blood Pressure (mmHg): 160/53 Reference Range: 80 - 120 mg / dl Electronic Signature(s) Signed: 07/13/2019 5:29:07 PM By: Kela Millin Entered By: Kela Millin on 07/13/2019 12:58:35

## 2019-07-14 DIAGNOSIS — I87312 Chronic venous hypertension (idiopathic) with ulcer of left lower extremity: Secondary | ICD-10-CM | POA: Diagnosis not present

## 2019-08-10 ENCOUNTER — Encounter (HOSPITAL_BASED_OUTPATIENT_CLINIC_OR_DEPARTMENT_OTHER): Payer: Medicare PPO | Attending: Physician Assistant | Admitting: Physician Assistant

## 2019-08-10 DIAGNOSIS — I10 Essential (primary) hypertension: Secondary | ICD-10-CM | POA: Diagnosis not present

## 2019-08-10 DIAGNOSIS — Q818 Other epidermolysis bullosa: Secondary | ICD-10-CM | POA: Diagnosis not present

## 2019-08-10 DIAGNOSIS — L97812 Non-pressure chronic ulcer of other part of right lower leg with fat layer exposed: Secondary | ICD-10-CM | POA: Diagnosis not present

## 2019-08-10 DIAGNOSIS — I872 Venous insufficiency (chronic) (peripheral): Secondary | ICD-10-CM | POA: Diagnosis not present

## 2019-08-10 DIAGNOSIS — L97822 Non-pressure chronic ulcer of other part of left lower leg with fat layer exposed: Secondary | ICD-10-CM | POA: Insufficient documentation

## 2019-08-10 DIAGNOSIS — I87312 Chronic venous hypertension (idiopathic) with ulcer of left lower extremity: Secondary | ICD-10-CM | POA: Diagnosis not present

## 2019-08-10 NOTE — Progress Notes (Addendum)
FAREED, FUNG ([id number]) Visit Report for 08/10/2019 Chief Complaint Document Details Patient Name: Date of Service: Carlos White 08/10/2019 12:30 PM Medical Record Number: [id number] Patient Account Number: [id number] Date of Birth/Sex: Treating RN: Jun 11, 1940 (79 y.o. Carlos White Primary Care Provider: Alton Revere, [place] RY Other Clinician: Referring Provider: Treating Provider/Extender: Garret Reddish, MA RY Weeks in Treatment: 7 Information Obtained from: Patient Chief Complaint Bilateral LE Ulcers Electronic Signature(s) Signed: 08/10/2019 1:09:06 PM By: Worthy Keeler PA-C Entered By: Worthy Keeler on 08/10/2019 13:09:06 -------------------------------------------------------------------------------- HPI Details Patient Name: Date of Service: Carlos White RDO [name] 08/10/2019 12:30 PM Medical Record Number: [id number] Patient Account Number: [id number] Date of Birth/Sex: Treating RN: 17-Jan-1940 (79 y.o. Carlos White Primary Care Provider: Alton Revere, [place] RY Other Clinician: Referring Provider: Treating Provider/Extender: Garret Reddish, MA RY Weeks in Treatment: 35 History of Present Illness HPI Description: 09/30/17 on evaluation today patient presents for initial evaluation and our clinic concerning issues that he has been having with his bilateral lower extremities. He states this has been going on for quite some time at least six months. Currently his regiment has been mainly cleaning the area with peroxide, applying the is foreign ointment, and wrapping the area with ABD pads and then an ace wrap loosely. He has dealt with issues of this nature he tells me for quite some time. He does have a history of having had a compound fracture of the left lower extremity which he thinks also makes this a much more difficult area for him to heal. He's previously been told that he had poor vascular flow but this was years ago at [facility] we do not have any of  those records at this time. He has a history of Epidermolysis Bullosa which was diagnosed around age 40 and he has been cared for at [facility] since that time. Subsequently he states this is hereditary and two of his children one male and one male also have this as well is one of his grandchildren that he is aware of. He has no evidence of infection necessarily at this point although he does have some necrotic tissue noted on the surface of the wound as far as the largest, left lateral lower extremity ulcer, is concerned. Overall I feel like all things considered he's been taking care of this very well. Obviously he has some fairly significant issues going on at this point in this regard. He does have a history otherwise of hypertension though for the most part other than the compound fracture of the left leg he seems to have been fairly healthy in my pinion. 10/07/17 on evaluation today patient actually appears to be doing better in regard to his bilateral lower extremity ulcers. With that being said he does still have some evidence of slough noted on the surface of the wounds I think the Iodoflex has been beneficial for him. His arterial studies are scheduled for October 2. With that being said I do believe that he is continuing to show signs of good improvement which is at least good news. 10/14/17 on evaluation today patient appears to be doing very well in regard to his lower extremity ulcers. He's definitely made some progress as far as healing is concerned although there still are several open areas that are going to need to be addressed. He did have his arterial study today which fortunately shows good findings with a right ABI of 1.23 with  a TBI of 0.86 in the left ABI of 1.28 with a TBI of 0.81. This is good news and will allow [place] to perform debridement as well. 10/23/2017; patient with a large wound on the left lateral calf, sizable area on the left medial malleolus and an area on the right  lateral malleolus. He has a new blister consistent with his underlying blistering skin disease just above this area we have been using Iodoflex on the lateral left calf lateral right ankle and collagen on the medial left ankle. We have been using Kerlix Coban wraps 10/28/17 on evaluation today the patient continues to have signs of improvement in regard to the overall appearance of the original wound. Unfortunately he did have some blistering over the right lateral lower extremity which has appeared to rupture on evaluation today and likely some of the dead tissue on the surface needs to be cleaned away the good news is this does not appear to be to significantly deep at this time. 11/04/17 on evaluation today patient actually appears to be doing a little better in regard to his lower extremity ulcers. He has been tolerating the dressing changes without complication. With that being said he does still have a significant wound especially over the left lateral lower extremity unfortunately. All of the wounds pretty much are going to require sharp debridement today. 11/11/17 on evaluation today patient appears to be doing more poorly in regard to his left lower extremity in particular. There does not appear to be any evidence of systemic infection although the wound itself as far as the larger left lateral lower extremity ulcer actually appears to be infected in my pinion. There's an older and the surface of the wound is dramatically worsened compared to last week. No fevers, chills, nausea, or vomiting noted at this time. 11/18/17 upon evaluation today patient actually appears to be doing better. I did review his culture today which really did not show any specific organism is a positive reason for his wound decline. There are multiple organisms present not predominant. Nonetheless he seems to be tolerate the doxycycline well in his wounds in general do seem to be doing better. Fortunately there does not  appear to be any evidence of infection at this time which is good news. Overall I'm very pleased with how things appear. Nonetheless he still has a lot of healing to go. I do think he could benefit from a Juxta-Lite wrap. 11/25/17 on evaluation today patient actually appears to be doing fairly well in regard to his wounds. He is still taking the antibiotics he has a few days left. Fortunately this seems to have been excellent for him as far as getting the infection control and very happy in this regard. With that being said the patient likewise is also very pleased with how things appear at this time in comparison to where we were he's not having as much pain. 12/02/17 Seen today for follow p and management of LLE wounds. Wounds appear to show some improvement. He denies pain, fever, or chills. Completed a course of doxycycline earlier this month. Scheduled to received Juxta-Lite wrap this week. No s/s of infections. 12/09/17 on evaluation today patient actually appears to be doing a little bit better in regard to his wounds. This is obscene very slow process and unfortunately he has a couple of new areas and this is due to the Epidermolysis Bullosa. Nonetheless I am concerned about the fact that he seems to be getting more areas not less  that is the reason we're gonna work on getting schedule for the vascular referral to see the venous specialist. 12/23/17 upon evaluation today patient's wounds currently shows evidence of still not doing quite as well is what I would like to have seen. Subsequently the patient did have his venous study which showed evidence of venous stasis. Subsequently I do think that a vascular evaluation for consideration of venous intervention would be appropriate. I'm not necessarily suggesting that will be anything that can be done but I think it is at least a good idea. He is in agreement with this plan. 12/30/17 on evaluation today patient actually appears to be doing very  well in regard to his wounds when compared to previous evaluation. Subsequently we have been using the [facility] Dressing which actually appears to have done excellent on his left lateral lower extremity ulcer. The quality of the wound surface is dramatically improved. There is some slight debridement that is going to be required at a couple of locations but overall I'm extremely pleased with how things appear here. 01/07/2018; this is a patient with a primary skin disorder epidermolyis bullosa. Is a large wound on the left lateral calf and smaller wounds on the right however there is a new wound on the right mid tibia area that occurred within the compression wrap that he did not change. We have been using Hydrofera Blue. On the left he is using Hydrofera Blue and Santyl to the inferior part of the wound and changing the dressing himself. 01/15/2018; primary skin disorder epidermolysis bullosa. He has several difficult wounds including the left lateral calf, smaller wounds on the left medial calf and the right lateral calf. The major area on the left lateral calf has a smaller area inferiorly that has necrotic debris we have been using Santyl to this. The rest of the wounds we have been using Hydrofera Blue. The area on the left calf actually looks larger this week. Uncontrolled edema several small open areas above it that are superficial 01/20/18 on evaluation today patient appears to be doing better as compared to last week in regard to his wounds of the bilateral lower extremities. He tolerated the bilateral compression wrap without complication. Overall I'm very pleased with how things appear at this time. The patient likewise is very happy. 01/27/18 on evaluation today patient appears to be doing decently well in regard to his bilateral lower extremity ulcers. He's been tolerating the dressing changes without complication. One issue he had is that he did have more drainage to the left leg wrapped  last week. He states in fact he probably should come in and let us change it on Friday however he just left it in place and kept adding extra absorption with ABD pads to the external portion of the wrap. Unfortunately he does have some aspiration type breakdown nothing significant but I do believe that this was probably counterproductive in general. Nonetheless his wounds do not appear to be terrible overall. 02/03/18 on evaluation today patient appears to be doing rather well in regard to his lower extremity ulcers. He has been tolerating the dressing changes without complication. He does tell me that he had to change the wrap on the left one since we last saw him. Subsequently I do not see any evidence of infection I do feel like the food was much better controlled at this point. 02/10/18 on evaluation today patient appears to be doing rather well in regard to his ulcers. He still has significant  alterations especially on the left lateral lower extremity. Fortunately there's no signs of infection at this time. Overall I feel like he is making good progress are some areas that I'm gonna attempt some debridement today. 02/17/18 on evaluation today patient appears to be doing okay in regard to his lower Trinity ulcer. It does appear on both locations he has a little bit of drainage causing some breakdown in maceration around the wound bed's although it doesn't appear to be too bad the right is a little bit worse than left. Fortunately there's no signs of infection which is good news. No fevers, chills, nausea, or vomiting noted at this time. 03/10/18 on evaluation today patient actually appears to be doing rather poorly in regard to his bilateral lower extremity ulcers. The right in particular is draining profusely and the wound is actually enlarging which is not good. I'm concerned about both possibly infection and the fact that there's a lot of moisture which is causing breakdown as well. Unfortunately  the patient has been trying to change this at home I'm afraid he may need to change more frequently in order to see the improvement that were looking for. There's no signs of systemic infection. 03/17/18 patient actually appears to be doing significantly better at this point in regard to his bilateral lower extremity ulcers. Fortunately there's no signs of infection. That is worsening infection at least indefinitely nothing systemic. With that being said he is having a lot of drainage though not quite as much is there in his last evaluation. Overall feel like he's on the side of improvement. I think if his results back from his culture which showed that he had a positive group B strep along with abundant Pseudomonas noted on the culture. For that reason I am gonna have him continue with the linezolid as we previously have ordered for him and I did go ahead as well today and prescribe Levaquin as well in order to treat the Pseudomonas portion of the infection noted. 03/24/18 on evaluation today patient actually appears to be doing very well in regard to his lower Trinity ulcer. He's been tolerating the dressing changes without complication. Fortunately both legs show signs of less drainage in his edema is very well controlled at this point as well. Overall very pleased with how things seem to be progressing. 03/31/18 on evaluation today patient actually appears to be doing excellent in regard to his bilateral lower extremity ulcers. These are not draining nearly as significant as what they were in the past overall seem to be shown signs of excellent improvement which is great news. Fortunately there is no sign of active infection at this time I do believe that the Levaquin has done extremely well for him in this regard. The patient continues to change these at home typically every day. We may be able to slowly work towards every other day changes since the drainage seems to be slowing down quite  significantly. 04/14/18 on evaluation today patient appears to be doing well in regard to his bilateral lower extremities. Let me Hilda Blades Almost completely healed which is excellent news. Fortunately he's shown signs of improvement all other sites as well with new skin growth there's some slight hyper granular tissue but for the most part this seems to be well maintained with the [facility] Dressing. I'm very happy in this regard. 04/28/18 on evaluation today patient appears to be doing rather well in regard to his ulcers of the bilateral lower extremities all things considering.  He continues to make some progress as far as new skin growth. There still some hyper granulation noted at this point despite the use of the [facility] Dressing. This is not terrible but I think we may want to consider conclude cauterization today with silver nitrate to try to help knock some of his back as well as helping with any biofilm on the surface of the wound. 05/12/18 on evaluation today patient's wounds actually appear to be doing fairly well in regard to the bilateral lower extremities. He's been tolerating the dressing changes without complication. Fortunately there's no signs of active infection at this time which is good news. Overall very pleased with how things seem to be progressing. You select silver nitrate was beneficial for him. 05/26/18 on evaluation today patient appears to be doing better in regard to left lower extremity and a little bit worse in regard to the right lower extremity. He states that he was pulling off the [facility] Dressing peel back some of the skin making this area significantly larger than what it was previous. He's not had any issues other than this and states even that hasn't caused any pain he just seems to obviously have a much larger area on the right when compared to the previous time I saw him. No fevers, chills, nausea, or vomiting noted at this time. 06/16/18 on  evaluation today patient actually appears to be doing a little better in my pinion in regard to his lower summary ulcers. He has new skin islands that seem to be spreading which is good news. Fortunately there's no signs of active infection at this time. His biggest issue is he tells me that coming as often as he does is becoming very cost prohibitive. He wonders if we can potentially spread this out. 07/14/18 on evaluation today patient appears to be doing a little bit worse in regard to his lower from the ulcer. Unfortunately he still continues to have a significant amount of drainage I think we need to do something to try to help this more. He is still somewhat reluctant to go the route of the Wound VAC although that may be the most appropriate thing for him. No fevers, chills, nausea, or vomiting noted at this time. 08/18/2018 on evaluation today patient actually appears to be doing quite well with regard to his bilateral lower extremity ulcers. I do feel like that currently he is making great progress the care max does seem to be doing a great job at helping to control the moisture he has no maceration or skin breakdown. Again this seems to be an excellent way to go. 1 thing we may want to change is adding collagen to the base of the wound and then the care max over top he is not opposed to this. 09/15/2018 on evaluation today patient appears to be doing well with regard to his bilateral lower extremity ulcers. He is showing some signs of improvement not necessarily in size but definitely in appearance. In fact he has a lot of new skin growing throughout the wounds along the edges as well as in the central portion of the wounds on both lower extremities. Overall I am extremely pleased to see this. 10/20/2018 on evaluation today patient actually appears to be doing quite well with regard to his wounds. They are not measuring significantly smaller but he does have a lot of new epithelization noted as  compared to previous. Fortunately there is no signs of active infection at this time.  No fevers, chills, nausea, vomiting, or diarrhea. 11/17/2018 on evaluation today patient presents for reevaluation concerning his bilateral lower extremity ulcers. Fortunately there is no signs of active infection at this time today. He has been tolerating the dressing changes without complication. No fevers, chills, nausea, vomiting, or diarrhea. Unfortunately in general the patient has not made as much improvement as I would like to have seen up to this point. He has been tolerating the dressing changes without complication and he does an excellent job taking care of his wounds at home in my opinion. The biggest issue I see is that he is just not making the progress that we need to be seeing currently. I think we may want to consider having him seen at a plastic surgery appointment and he has previously seen someone in years past at [facility] in [place]. That is definitely a possibility for [place] to look into at this point. 12/29/2018 on evaluation today patient appears to be doing better in regard to the overall visual appearance of his wounds which do not appear to be as macerated. He does have a much larger skin island in the middle of the left lower extremity ulcer which is doing much better. He tells me the pain is also significantly better. With that being said overall his improvement as far as the size of the wounds is not better but again these are very irregular in change shape quite often. Fortunately there is no evidence of active infection at this time which is great news. He never heard anything from [facility] regarding the plastic surgery referral that we made to them. 01/26/2019 upon evaluation today patient appears to be doing a little better in regard to his wounds today. He has been tolerating the dressing changes again he performs these for the most part on his own. He does a great  job wrapping his legs in my opinion. Unfortunately he has not been able to get down to [facility] to see if there is anything from a plastic surgery standpoint that could be done to help with his legs simply due to the fact that his wife unfortunately sustained a compression fracture in her spine she is seeing Dr. Saintclair Halsted and subsequently is going to be having what sounds to be a kyphoplasty type procedure. With that being said that has not been scheduled yet there is still waiting on an MRI the patient is very busy in fact overly busy trying to help take care of his wife at this point. I completely understand this is more of a strain on him at this time 02/23/2019 upon evaluation today patient actually appears to be making some progress. I am actually very pleased with the overall appearance of his wounds even compared to last evaluation. He seems to be doing quite well. He is taking care of his wife unfortunately she did have a compression fracture she has had the procedure for this but still she has a slow road to recovery. For that reason he still not gone to [facility] for a second opinion in this regard. Obviously the goal there was if there was anything that can be done from a skin graft standpoint or otherwise. 03/23/2019 upon evaluation today patient continues to have issues with lower extremity ulcers. Since the beginning he has made progress but at the same time the wounds unfortunately just will not close. We have been trying to get the patient to [facility] to see a specialist there but  unfortunately with the everything going on with his wife he has not been able to make that appointment time yet he states he may be able to in the next 1-2 months but is not really sure. 04/27/2019 on evaluation today patient appears to be doing a little bit more poorly. His last evaluation. He appears to have some erythema around the edges of the wound at this point. Fortunately there is no  signs of active infection at this time which is good news. No fevers, chills, nausea, vomiting, or diarrhea. 06/08/2019 on evaluation today patient appears to be doing well with regard to his wounds. Overall they are actually measuring smaller compared to the last visit last month. We did treat him for Pseudomonas as well as methicillin-resistant Staph aureus. He was only on the treatment for MRSA however for 7 days as the Cipro was resistant and subsequently we had to place him on doxycycline. Nonetheless I am thinking that we may want to add the doxycycline and just do a month-long treatment considering the longstanding nature of his wounds and see if we get this under better control. 07/13/2019 upon evaluation today patient appears to be doing fairly well in regard to his bilateral lower extremities. There does not appear to be any signs of active infection which is good news. No fevers, chills, nausea, vomiting, or diarrhea. 08/10/2019 upon evaluation today patient appears to be doing about the same with regard to his wounds in general. Unfortunately he is not significantly better although is also not significantly worse which is great news there is no evidence of active infection at this time which is good news. He still dealing with a lot going on with his wife and therefore is not really able to go see anyone at the specialty clinic at [facility] that we have previously set up still. Electronic Signature(s) Signed: 08/10/2019 2:03:09 PM By: Worthy Keeler PA-C Previous Signature: 08/10/2019 1:09:21 PM Version By: Worthy Keeler PA-C Entered By: Worthy Keeler on 08/10/2019 14:03:09 -------------------------------------------------------------------------------- Physical Exam Details Patient Name: Date of Service: Carlos White 08/10/2019 12:30 PM Medical Record Number: [id number] Patient Account Number: [id number] Date of Birth/Sex: Treating RN: 1940/09/20 (79 y.o. Carlos White Primary  Care Provider: Alton Revere, [place] RY Other Clinician: Referring Provider: Treating Provider/Extender: Garret Reddish, MA R[place] Weeks in Treatment: 49 Constitutional Well-nourished and well-hydrated in no acute distress. Respiratory normal breathing without difficulty. Psychiatric this patient is able to make decisions and demonstrates good insight into disease process. Alert and Oriented x 3. pleasant and cooperative. Notes Patient's wounds currently showed signs of filling into the surface there is really no depth to the wounds which is good news. There is also no signs of infection. With that being said the patient unfortunately continues to have really no significant progress as far as new epithelial growth is concerned. Again with the epidermolysis bullosa I am really not sure if there is anything else out there that we could really do to aid him in healing. That was the reason for the referral to the specialty center. Electronic Signature(s) Signed: 08/10/2019 2:03:43 PM By: Worthy Keeler PA-C Entered By: Worthy Keeler on 08/10/2019 14:03:43 -------------------------------------------------------------------------------- Physician Orders Details Patient Name: Date of Service: Carlos White RDO [name] 08/10/2019 12:30 PM Medical Record Number: [id number] Patient Account Number: [id number] Date of Birth/Sex: Treating RN: 07/27/1940 (79 y.o. 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 Dressing Change Frequency Wound #2 Left,Distal,Lateral Lower Leg Change Dressing every other day. Wound #5 Right,Proximal,Lateral Lower Leg Change Dressing every other day. Skin Barriers/Peri-Wound Care Barrier cream - to periwound areas Moisturizing lotion - both legs with dressing changes Wound Cleansing Wound #2 Left,Distal,Lateral Lower Leg May shower and wash wound with soap and water. - with dressing changes Wound #5 Right,Proximal,Lateral Lower Leg May shower and wash wound with soap and water. - with dressing changes Primary Wound Dressing Wound #2 Left,Distal,Lateral Lower Leg Collagen - fibracol - do not need to moisten Wound #5 Right,Proximal,Lateral Lower Leg Collagen - fibracol - do not need to moisten Secondary Dressing Wound #2 Left,Distal,Lateral Lower Leg Kerlix/Rolled Gauze ABD pad - all wounds Zetuvit or Kerramax Wound #5 Right,Proximal,Lateral Lower Leg Kerlix/Rolled Gauze ABD pad - all wounds Zetuvit or Kerramax Edema Control Avoid standing for long periods of time Elevate legs to the level of the heart or above for 30 minutes daily and/or when sitting, a frequency of: - throughout the day Support Garment 20-30 mm/Hg pressure to: - patient to apply juxtalites daily to both legs over ace wraps daily Other: - ACE wrap both legs Electronic Signature(s) Signed: 08/10/2019 6:38:59 PM By: Baruch Gouty RN, BSN Signed: 08/10/2019 6:41:43 PM By: Worthy Keeler PA-C Entered By: Baruch Gouty on 08/10/2019 13:33:02 -------------------------------------------------------------------------------- Problem List Details Patient Name: Date of Service: [name], [name] 08/10/2019 12:30 PM Medical Record Number: [id number] Patient Account Number: [id number] Date of Birth/Sex: Treating RN: 1940/11/17 (79 y.o. 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 09/30/2017 No Yes L97.322 Non-pressure chronic ulcer of left ankle with fat layer exposed 09/30/2017 No Yes L97.822 Non-pressure chronic ulcer of other part of left lower leg with fat layer exposed9/18/2019 No Yes L97.812 Non-pressure chronic ulcer of other part of right lower leg with fat layer 09/30/2017 No Yes exposed I10 Essential (primary) hypertension 09/30/2017 No Yes Inactive Problems Resolved Problems Electronic Signature(s) Signed: 08/10/2019 1:08:56 PM By: Worthy Keeler PA-C Entered By: Worthy Keeler on 08/10/2019 13:08:56 -------------------------------------------------------------------------------- Progress Note Details Patient Name: Date of Service: Carlos White RDO [name] 08/10/2019 12:30 PM Medical Record Number: [id number] Patient Account Number: [id number] Date of Birth/Sex: Treating RN: 1940/12/30 (79 y.o. Carlos White Primary Care Provider: Alton Revere, [place] RY Other Clinician: Referring Provider: Treating Provider/Extender: Garret Reddish, [place] RY Weeks in Treatment: 62 Subjective Chief Complaint Information obtained from Patient Bilateral LE Ulcers History of Present Illness (HPI) 09/30/17 on evaluation today patient presents for initial evaluation and our clinic concerning issues that he has been having with his bilateral lower extremities. He states this has been going on for quite some time at least six months. Currently his regiment has been mainly cleaning  the area with peroxide, applying the is foreign ointment, and wrapping the area with ABD pads and then an ace wrap loosely. He has dealt with issues of this nature he tells me  for quite some time. He does have a history of having had a compound fracture of the left lower extremity which he thinks also makes this a much more difficult area for him to heal. He's previously been told that he had poor vascular flow but this was years ago at [facility] we do not have any of those records at this time. He has a history of Epidermolysis Bullosa which was diagnosed around age 2 and he has been cared for at [facility] since that time. Subsequently he states this is hereditary and two of his children one male and one male also have this as well is one of his grandchildren that he is aware of. He has no evidence of infection necessarily at this point although he does have some necrotic tissue noted on the surface of the wound as far as the largest, left lateral lower extremity ulcer, is concerned. Overall I feel like all things considered he's been taking care of this very well. Obviously he has some fairly significant issues going on at this point in this regard. He does have a history otherwise of hypertension though for the most part other than the compound fracture of the left leg he seems to have been fairly healthy in my pinion. 10/07/17 on evaluation today patient actually appears to be doing better in regard to his bilateral lower extremity ulcers. With that being said he does still have some evidence of slough noted on the surface of the wounds I think the Iodoflex has been beneficial for him. His arterial studies are scheduled for October 2. With that being said I do believe that he is continuing to show signs of good improvement which is at least good news. 10/14/17 on evaluation today patient appears to be doing very well in regard to his lower extremity ulcers. He's definitely made some progress as far as healing is concerned although there still are several open areas that are going to need to be addressed. He did have his arterial study today which fortunately shows good findings  with a right ABI of 1.23 with a TBI of 0.86 in the left ABI of 1.28 with a TBI of 0.81. This is good news and will allow [place] to perform debridement as well. 10/23/2017; patient with a large wound on the left lateral calf, sizable area on the left medial malleolus and an area on the right lateral malleolus. He has a new blister consistent with his underlying blistering skin disease just above this area we have been using Iodoflex on the lateral left calf lateral right ankle and collagen on the medial left ankle. We have been using Kerlix Coban wraps 10/28/17 on evaluation today the patient continues to have signs of improvement in regard to the overall appearance of the original wound. Unfortunately he did have some blistering over the right lateral lower extremity which has appeared to rupture on evaluation today and likely some of the dead tissue on the surface needs to be cleaned away the good news is this does not appear to be to significantly deep at this time. 11/04/17 on evaluation today patient actually appears to be doing a little better in regard to his lower extremity ulcers. He has been tolerating the dressing changes without complication. With that being said he does still have a  significant wound especially over the left lateral lower extremity unfortunately. All of the wounds pretty much are going to require sharp debridement today. 11/11/17 on evaluation today patient appears to be doing more poorly in regard to his left lower extremity in particular. There does not appear to be any evidence of systemic infection although the wound itself as far as the larger left lateral lower extremity ulcer actually appears to be infected in my pinion. There's an older and the surface of the wound is dramatically worsened compared to last week. No fevers, chills, nausea, or vomiting noted at this time. 11/18/17 upon evaluation today patient actually appears to be doing better. I did review his culture  today which really did not show any specific organism is a positive reason for his wound decline. There are multiple organisms present not predominant. Nonetheless he seems to be tolerate the doxycycline well in his wounds in general do seem to be doing better. Fortunately there does not appear to be any evidence of infection at this time which is good news. Overall I'm very pleased with how things appear. Nonetheless he still has a lot of healing to go. I do think he could benefit from a Juxta-Lite wrap. 11/25/17 on evaluation today patient actually appears to be doing fairly well in regard to his wounds. He is still taking the antibiotics he has a few days left. Fortunately this seems to have been excellent for him as far as getting the infection control and very happy in this regard. With that being said the patient likewise is also very pleased with how things appear at this time in comparison to where we were he's not having as much pain. 12/02/17 Seen today for follow p and management of LLE wounds. Wounds appear to show some improvement. He denies pain, fever, or chills. Completed a course of doxycycline earlier this month. Scheduled to received Juxta-Lite wrap this week. No s/s of infections. 12/09/17 on evaluation today patient actually appears to be doing a little bit better in regard to his wounds. This is obscene very slow process and unfortunately he has a couple of new areas and this is due to the Epidermolysis Bullosa. Nonetheless I am concerned about the fact that he seems to be getting more areas not less that is the reason we're gonna work on getting schedule for the vascular referral to see the venous specialist. 12/23/17 upon evaluation today patient's wounds currently shows evidence of still not doing quite as well is what I would like to have seen. Subsequently the patient did have his venous study which showed evidence of venous stasis. Subsequently I do think that a vascular  evaluation for consideration of venous intervention would be appropriate. I'm not necessarily suggesting that will be anything that can be done but I think it is at least a good idea. He is in agreement with this plan. 12/30/17 on evaluation today patient actually appears to be doing very well in regard to his wounds when compared to previous evaluation. Subsequently we have been using the [facility] Dressing which actually appears to have done excellent on his left lateral lower extremity ulcer. The quality of the wound surface is dramatically improved. There is some slight debridement that is going to be required at a couple of locations but overall I'm extremely pleased with how things appear here. 01/07/2018; this is a patient with a primary skin disorder epidermolyis bullosa. Is a large wound on the left lateral calf and smaller wounds on the  right however there is a new wound on the right mid tibia area that occurred within the compression wrap that he did not change. We have been using Hydrofera Blue. On the left he is using Hydrofera Blue and Santyl to the inferior part of the wound and changing the dressing himself. 01/15/2018; primary skin disorder epidermolysis bullosa. He has several difficult wounds including the left lateral calf, smaller wounds on the left medial calf and the right lateral calf. The major area on the left lateral calf has a smaller area inferiorly that has necrotic debris we have been using Santyl to this. The rest of the wounds we have been using Hydrofera Blue. The area on the left calf actually looks larger this week. Uncontrolled edema several small open areas above it that are superficial 01/20/18 on evaluation today patient appears to be doing better as compared to last week in regard to his wounds of the bilateral lower extremities. He tolerated the bilateral compression wrap without complication. Overall I'm very pleased with how things appear at this time. The  patient likewise is very happy. 01/27/18 on evaluation today patient appears to be doing decently well in regard to his bilateral lower extremity ulcers. He's been tolerating the dressing changes without complication. One issue he had is that he did have more drainage to the left leg wrapped last week. He states in fact he probably should come in and let us change it on Friday however he just left it in place and kept adding extra absorption with ABD pads to the external portion of the wrap. Unfortunately he does have some aspiration type breakdown nothing significant but I do believe that this was probably counterproductive in general. Nonetheless his wounds do not appear to be terrible overall. 02/03/18 on evaluation today patient appears to be doing rather well in regard to his lower extremity ulcers. He has been tolerating the dressing changes without complication. He does tell me that he had to change the wrap on the left one since we last saw him. Subsequently I do not see any evidence of infection I do feel like the food was much better controlled at this point. 02/10/18 on evaluation today patient appears to be doing rather well in regard to his ulcers. He still has significant alterations especially on the left lateral lower extremity. Fortunately there's no signs of infection at this time. Overall I feel like he is making good progress are some areas that I'm gonna attempt some debridement today. 02/17/18 on evaluation today patient appears to be doing okay in regard to his lower Trinity ulcer. It does appear on both locations he has a little bit of drainage causing some breakdown in maceration around the wound bed's although it doesn't appear to be too bad the right is a little bit worse than left. Fortunately there's no signs of infection which is good news. No fevers, chills, nausea, or vomiting noted at this time. 03/10/18 on evaluation today patient actually appears to be doing rather poorly  in regard to his bilateral lower extremity ulcers. The right in particular is draining profusely and the wound is actually enlarging which is not good. I'm concerned about both possibly infection and the fact that there's a lot of moisture which is causing breakdown as well. Unfortunately the patient has been trying to change this at home I'm afraid he may need to change more frequently in order to see the improvement that were looking for. There's no signs of systemic infection. 03/17/18 patient  actually appears to be doing significantly better at this point in regard to his bilateral lower extremity ulcers. Fortunately there's no signs of infection. That is worsening infection at least indefinitely nothing systemic. With that being said he is having a lot of drainage though not quite as much is there in his last evaluation. Overall feel like he's on the side of improvement. I think if his results back from his culture which showed that he had a positive group B strep along with abundant Pseudomonas noted on the culture. For that reason I am gonna have him continue with the linezolid as we previously have ordered for him and I did go ahead as well today and prescribe Levaquin as well in order to treat the Pseudomonas portion of the infection noted. 03/24/18 on evaluation today patient actually appears to be doing very well in regard to his lower Trinity ulcer. He's been tolerating the dressing changes without complication. Fortunately both legs show signs of less drainage in his edema is very well controlled at this point as well. Overall very pleased with how things seem to be progressing. 03/31/18 on evaluation today patient actually appears to be doing excellent in regard to his bilateral lower extremity ulcers. These are not draining nearly as significant as what they were in the past overall seem to be shown signs of excellent improvement which is great news. Fortunately there is no sign of  active infection at this time I do believe that the Levaquin has done extremely well for him in this regard. The patient continues to change these at home typically every day. We may be able to slowly work towards every other day changes since the drainage seems to be slowing down quite significantly. 04/14/18 on evaluation today patient appears to be doing well in regard to his bilateral lower extremities. Let me [facility] Almost completely healed which is excellent news. Fortunately he's shown signs of improvement all other sites as well with new skin growth there's some slight hyper granular tissue but for the most part this seems to be well maintained with the [facility] Dressing. I'm very happy in this regard. 04/28/18 on evaluation today patient appears to be doing rather well in regard to his ulcers of the bilateral lower extremities all things considering. He continues to make some progress as far as new skin growth. There still some hyper granulation noted at this point despite the use of the [facility] Dressing. This is not terrible but I think we may want to consider conclude cauterization today with silver nitrate to try to help knock some of his back as well as helping with any biofilm on the surface of the wound. 05/12/18 on evaluation today patient's wounds actually appear to be doing fairly well in regard to the bilateral lower extremities. He's been tolerating the dressing changes without complication. Fortunately there's no signs of active infection at this time which is good news. Overall very pleased with how things seem to be progressing. You select silver nitrate was beneficial for him. 05/26/18 on evaluation today patient appears to be doing better in regard to left lower extremity and a little bit worse in regard to the right lower extremity. He states that he was pulling off the [facility] Dressing peel back some of the skin making this area significantly larger than  what it was previous. He's not had any issues other than this and states even that hasn't caused any pain he just seems to obviously have a much  larger area on the right when compared to the previous time I saw him. No fevers, chills, nausea, or vomiting noted at this time. 06/16/18 on evaluation today patient actually appears to be doing a little better in my pinion in regard to his lower summary ulcers. He has new skin islands that seem to be spreading which is good news. Fortunately there's no signs of active infection at this time. His biggest issue is he tells me that coming as often as he does is becoming very cost prohibitive. He wonders if we can potentially spread this out. 07/14/18 on evaluation today patient appears to be doing a little bit worse in regard to his lower from the ulcer. Unfortunately he still continues to have a significant amount of drainage I think we need to do something to try to help this more. He is still somewhat reluctant to go the route of the Wound VAC although that may be the most appropriate thing for him. No fevers, chills, nausea, or vomiting noted at this time. 08/18/2018 on evaluation today patient actually appears to be doing quite well with regard to his bilateral lower extremity ulcers. I do feel like that currently he is making great progress the care max does seem to be doing a great job at helping to control the moisture he has no maceration or skin breakdown. Again this seems to be an excellent way to go. 1 thing we may want to change is adding collagen to the base of the wound and then the care max over top he is not opposed to this. 09/15/2018 on evaluation today patient appears to be doing well with regard to his bilateral lower extremity ulcers. He is showing some signs of improvement not necessarily in size but definitely in appearance. In fact he has a lot of new skin growing throughout the wounds along the edges as well as in the central portion of the  wounds on both lower extremities. Overall I am extremely pleased to see this. 10/20/2018 on evaluation today patient actually appears to be doing quite well with regard to his wounds. They are not measuring significantly smaller but he does have a lot of new epithelization noted as compared to previous. Fortunately there is no signs of active infection at this time. No fevers, chills, nausea, vomiting, or diarrhea. 11/17/2018 on evaluation today patient presents for reevaluation concerning his bilateral lower extremity ulcers. Fortunately there is no signs of active infection at this time today. He has been tolerating the dressing changes without complication. No fevers, chills, nausea, vomiting, or diarrhea. Unfortunately in general the patient has not made as much improvement as I would like to have seen up to this point. He has been tolerating the dressing changes without complication and he does an excellent job taking care of his wounds at home in my opinion. The biggest issue I see is that he is just not making the progress that we need to be seeing currently. I think we may want to consider having him seen at a plastic surgery appointment and he has previously seen someone in years past at [facility] in [place]. That is definitely a possibility for [place] to look into at this point. 12/29/2018 on evaluation today patient appears to be doing better in regard to the overall visual appearance of his wounds which do not appear to be as macerated. He does have a much larger skin island in the middle of the left lower extremity ulcer which is doing  much better. He tells me the pain is also significantly better. With that being said overall his improvement as far as the size of the wounds is not better but again these are very irregular in change shape quite often. Fortunately there is no evidence of active infection at this time which is great news. He never heard anything from [facility]  regarding the plastic surgery referral that we made to them. 01/26/2019 upon evaluation today patient appears to be doing a little better in regard to his wounds today. He has been tolerating the dressing changes again he performs these for the most part on his own. He does a great job wrapping his legs in my opinion. Unfortunately he has not been able to get down to [facility] to see if there is anything from a plastic surgery standpoint that could be done to help with his legs simply due to the fact that his wife unfortunately sustained a compression fracture in her spine she is seeing Dr. Saintclair Halsted and subsequently is going to be having what sounds to be a kyphoplasty type procedure. With that being said that has not been scheduled yet there is still waiting on an MRI the patient is very busy in fact overly busy trying to help take care of his wife at this point. I completely understand this is more of a strain on him at this time 02/23/2019 upon evaluation today patient actually appears to be making some progress. I am actually very pleased with the overall appearance of his wounds even compared to last evaluation. He seems to be doing quite well. He is taking care of his wife unfortunately she did have a compression fracture she has had the procedure for this but still she has a slow road to recovery. For that reason he still not gone to [facility] for a second opinion in this regard. Obviously the goal there was if there was anything that can be done from a skin graft standpoint or otherwise. 03/23/2019 upon evaluation today patient continues to have issues with lower extremity ulcers. Since the beginning he has made progress but at the same time the wounds unfortunately just will not close. We have been trying to get the patient to [facility] to see a specialist there but unfortunately with the everything going on with his wife he has not been able to make that appointment time yet he  states he may be able to in the next 1-2 months but is not really sure. 04/27/2019 on evaluation today patient appears to be doing a little bit more poorly. His last evaluation. He appears to have some erythema around the edges of the wound at this point. Fortunately there is no signs of active infection at this time which is good news. No fevers, chills, nausea, vomiting, or diarrhea. 06/08/2019 on evaluation today patient appears to be doing well with regard to his wounds. Overall they are actually measuring smaller compared to the last visit last month. We did treat him for Pseudomonas as well as methicillin-resistant Staph aureus. He was only on the treatment for MRSA however for 7 days as the Cipro was resistant and subsequently we had to place him on doxycycline. Nonetheless I am thinking that we may want to add the doxycycline and just do a month-long treatment considering the longstanding nature of his wounds and see if we get this under better control. 07/13/2019 upon evaluation today patient appears to be doing fairly well in regard to his bilateral lower  extremities. There does not appear to be any signs of active infection which is good news. No fevers, chills, nausea, vomiting, or diarrhea. 08/10/2019 upon evaluation today patient appears to be doing about the same with regard to his wounds in general. Unfortunately he is not significantly better although is also not significantly worse which is great news there is no evidence of active infection at this time which is good news. He still dealing with a lot going on with his wife and therefore is not really able to go see anyone at the specialty clinic at [facility] that we have previously set up still. Objective Constitutional Well-nourished and well-hydrated in no acute distress. Vitals Time Taken: 12:59 PM, Height: 71 in, Weight: 220 lbs, BMI: 30.7, Temperature: 98.1 F, Pulse: 74 bpm, Respiratory Rate: 14 breaths/min, Blood Pressure:  158/81 mmHg. Respiratory normal breathing without difficulty. Psychiatric this patient is able to make decisions and demonstrates good insight into disease process. Alert and Oriented x 3. pleasant and cooperative. General Notes: Patient's wounds currently showed signs of filling into the surface there is really no depth to the wounds which is good news. There is also no signs of infection. With that being said the patient unfortunately continues to have really no significant progress as far as new epithelial growth is concerned. Again with the epidermolysis bullosa I am really not sure if there is anything else out there that we could really do to aid him in healing. That was the reason for the referral to the specialty center. Integumentary (Hair, Skin) Wound #2 status is Open. Original cause of wound was Gradually Appeared. The wound is located on the Left,Distal,Lateral Lower Leg. The wound measures 18.3cm length x 14.5cm width x 0.1cm depth; 208.405cm^2 area and 20.841cm^3 volume. There is Fat Layer (Subcutaneous Tissue) Exposed exposed. There is no tunneling or undermining noted. There is a large amount of serosanguineous drainage noted. The wound margin is flat and intact. There is large (67-100%) red granulation within the wound bed. There is a small (1-33%) amount of necrotic tissue within the wound bed including Adherent Slough. Wound #5 status is Open. Original cause of wound was Gradually Appeared. The wound is located on the Right,Proximal,Lateral Lower Leg. The wound measures 16.1cm length x 13.5cm width x 0.1cm depth; 170.706cm^2 area and 17.071cm^3 volume. There is Fat Layer (Subcutaneous Tissue) Exposed exposed. There is no tunneling or undermining noted. There is a large amount of serosanguineous drainage noted. The wound margin is flat and intact. There is large (67-100%) red granulation within the wound bed. There is no necrotic tissue within the wound bed. 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 Dressing Change Frequency: Wound #2 Left,Distal,Lateral Lower Leg: Change Dressing every other day. Wound #5 Right,Proximal,Lateral Lower Leg: Change Dressing every other day. Skin Barriers/Peri-Wound Care: Barrier cream - to periwound areas Moisturizing lotion - both legs with dressing changes Wound Cleansing: Wound #2 Left,Distal,Lateral Lower Leg: May shower and wash wound with soap and water. - with dressing changes Wound #5 Right,Proximal,Lateral Lower Leg: May shower and wash wound with soap and water. - with dressing changes Primary Wound Dressing: Wound #2 Left,Distal,Lateral Lower Leg: Collagen - fibracol - do not need  to moisten Wound #5 Right,Proximal,Lateral Lower Leg: Collagen - fibracol - do not need to moisten Secondary Dressing: Wound #2 Left,Distal,Lateral Lower Leg: Kerlix/Rolled Gauze ABD pad - all wounds Zetuvit or Kerramax Wound #5 Right,Proximal,Lateral Lower Leg: Kerlix/Rolled Gauze ABD pad - all wounds Zetuvit or Kerramax Edema Control: Avoid standing for long periods of time Elevate legs to the level of the heart or above for 30 minutes daily and/or when sitting, a frequency of: - throughout the day Support Garment 20-30 mm/Hg pressure to: - patient to apply juxtalites daily to both legs over ace wraps daily Other: - ACE wrap both legs 1. Recommend currently that we go ahead and initiate a continuation of treatment with the current measures including the collagen along with the care max an ABD pad to all wounds. 2. We will also get a go ahead  and securely thing with roll gauze at this point which seems to do a good job coupled with the patient's juxta light that he uses bilaterally. 3. I am also going to suggest he continue to monitor for any signs of infection. Obviously he needs to keep the edema under control try to elevate his legs much as possible will help in this regard. We will see patient back for reevaluation in 1 Month here in the clinic. If anything worsens or changes patient will contact our office for additional recommendations. Electronic Signature(s) Signed: 08/10/2019 2:04:39 PM By: Worthy Keeler PA-C Entered By: Worthy Keeler on 08/10/2019 14:04:38 -------------------------------------------------------------------------------- SuperBill Details Patient Name: Date of Service: Carlos White RDO [name] 08/10/2019 Medical Record Number: [id number] Patient Account Number: [id number] Date of Birth/Sex: Treating RN: 1941-01-07 (79 y.o. Carlos White Primary Care Provider: Alton Revere, [place] RY Other Clinician: Referring Provider: Treating Provider/Extender: Garret Reddish, MA RY Weeks in Treatment: 60 Diagnosis Coding ICD-10 Codes Code Description I87.2 Venous insufficiency (chronic) (peripheral) Q81.8 Other epidermolysis bullosa L97.322 Non-pressure chronic ulcer of left ankle with fat layer exposed L97.822 Non-pressure chronic ulcer of other part of left lower leg with fat layer exposed L97.812 Non-pressure chronic ulcer of other part of right lower leg with fat layer exposed I10 Essential (primary) hypertension Facility Procedures CPT4 Code: [id number] Description: 99214 - WOUND CARE VISIT-LEV 4 EST PT Modifier: Quantity: 1 Physician Procedures : CPT4 Code Description Modifier [id number] - WC PHYS LEVEL 3 - EST PT ICD-10 Diagnosis Description I87.2 Venous insufficiency (chronic) (peripheral) Q81.8 Other epidermolysis bullosa L97.322 Non-pressure chronic ulcer of left ankle with fat layer  exposed  L97.822 Non-pressure chronic ulcer of other part of left lower leg with fat layer exposed Quantity: 1 Electronic Signature(s) Signed: 08/10/2019 2:14:44 PM By: Worthy Keeler PA-C Entered By: Worthy Keeler on 08/10/2019 14:14:44

## 2019-08-11 DIAGNOSIS — I87312 Chronic venous hypertension (idiopathic) with ulcer of left lower extremity: Secondary | ICD-10-CM | POA: Diagnosis not present

## 2019-08-31 ENCOUNTER — Other Ambulatory Visit: Payer: Self-pay | Admitting: Internal Medicine

## 2019-09-02 ENCOUNTER — Other Ambulatory Visit: Payer: Self-pay | Admitting: Internal Medicine

## 2019-09-07 ENCOUNTER — Other Ambulatory Visit: Payer: Self-pay

## 2019-09-07 ENCOUNTER — Encounter (HOSPITAL_BASED_OUTPATIENT_CLINIC_OR_DEPARTMENT_OTHER): Payer: Medicare PPO | Attending: Physician Assistant | Admitting: Physician Assistant

## 2019-09-07 DIAGNOSIS — L97822 Non-pressure chronic ulcer of other part of left lower leg with fat layer exposed: Secondary | ICD-10-CM | POA: Insufficient documentation

## 2019-09-07 DIAGNOSIS — J449 Chronic obstructive pulmonary disease, unspecified: Secondary | ICD-10-CM | POA: Insufficient documentation

## 2019-09-07 DIAGNOSIS — I872 Venous insufficiency (chronic) (peripheral): Secondary | ICD-10-CM | POA: Diagnosis not present

## 2019-09-07 DIAGNOSIS — L97812 Non-pressure chronic ulcer of other part of right lower leg with fat layer exposed: Secondary | ICD-10-CM | POA: Diagnosis not present

## 2019-09-07 DIAGNOSIS — I10 Essential (primary) hypertension: Secondary | ICD-10-CM | POA: Diagnosis not present

## 2019-09-07 DIAGNOSIS — L97322 Non-pressure chronic ulcer of left ankle with fat layer exposed: Secondary | ICD-10-CM | POA: Insufficient documentation

## 2019-09-07 DIAGNOSIS — L97212 Non-pressure chronic ulcer of right calf with fat layer exposed: Secondary | ICD-10-CM | POA: Diagnosis not present

## 2019-09-07 NOTE — Progress Notes (Addendum)
NIL, XIONG ([id number]) Visit Report for 09/07/2019 Chief Complaint Document Details Patient Name: Date of Service: Carlos White 09/07/2019 12:30 PM Medical Record Number: [id number] Patient Account Number: [id number] Date of Birth/Sex: Treating RN: [birth year] (79 y.o. [name] Primary Care Provider: Alton Revere, [place] RY Other Clinician: Referring Provider: Treating Provider/Extender: Garret Reddish, MA RY Weeks in Treatment: 101 Information Obtained from: Patient Chief Complaint Bilateral LE Ulcers Electronic Signature(s) Signed: 09/07/2019 1:12:27 PM By: Worthy Keeler PA-C Entered By: Worthy Keeler on 09/07/2019 13:12:26 -------------------------------------------------------------------------------- HPI Details Patient Name: Date of Service: [name] 09/07/2019 12:30 PM Medical Record Number: [id number] Patient Account Number: [id number] Date of Birth/Sex: Treating RN: 04/17/1940 (79 y.o. [name] Primary Care Provider: Alton Revere, [place] R[place] Other Clinician: Referring Provider: Treating Provider/Extender: Garret Reddish, MA RY Weeks in Treatment: 101 History of Present Illness HPI Description: 09/30/17 on evaluation today patient presents for initial evaluation and our clinic concerning issues that he has been having with his bilateral lower extremities. He states this has been going on for quite some time at least six months. Currently his regiment has been mainly cleaning the area with peroxide, applying the is foreign ointment, and wrapping the area with ABD pads and then an ace wrap loosely. He has dealt with issues of this nature he tells me for quite some time. He does have a history of having had a compound fracture of the left lower extremity which he thinks also makes this a much more difficult area for him to heal. He's previously been told that he had poor vascular flow but this was years ago at [facility] we do not have any of  those records at this time. He has a history of Epidermolysis Bullosa which was diagnosed around age 49 and he has been cared for at [facility] since that time. Subsequently he states this is hereditary and two of his children one male and one male also have this as well is one of his grandchildren that he is aware of. He has no evidence of infection necessarily at this point although he does have some necrotic tissue noted on the surface of the wound as far as the largest, left lateral lower extremity ulcer, is concerned. Overall I feel like all things considered he's been taking care of this very well. Obviously he has some fairly significant issues going on at this point in this regard. He does have a history otherwise of hypertension though for the most part other than the compound fracture of the left leg he seems to have been fairly healthy in my pinion. 10/07/17 on evaluation today patient actually appears to be doing better in regard to his bilateral lower extremity ulcers. With that being said he does still have some evidence of slough noted on the surface of the wounds I think the Iodoflex has been beneficial for him. His arterial studies are scheduled for October 2. With that being said I do believe that he is continuing to show signs of good improvement which is at least good news. 10/14/17 on evaluation today patient appears to be doing very well in regard to his lower extremity ulcers. He's definitely made some progress as far as healing is concerned although there still are several open areas that are going to need to be addressed. He did have his arterial study today which fortunately shows good findings with a right ABI of 1.23 with  a TBI of 0.86 in the left ABI of 1.28 with a TBI of 0.81. This is good news and will allow [place] to perform debridement as well. 10/23/2017; patient with a large wound on the left lateral calf, sizable area on the left medial malleolus and an area on the right  lateral malleolus. He has a new blister consistent with his underlying blistering skin disease just above this area we have been using Iodoflex on the lateral left calf lateral right ankle and collagen on the medial left ankle. We have been using Kerlix Coban wraps 10/28/17 on evaluation today the patient continues to have signs of improvement in regard to the overall appearance of the original wound. Unfortunately he did have some blistering over the right lateral lower extremity which has appeared to rupture on evaluation today and likely some of the dead tissue on the surface needs to be cleaned away the good news is this does not appear to be to significantly deep at this time. 11/04/17 on evaluation today patient actually appears to be doing a little better in regard to his lower extremity ulcers. He has been tolerating the dressing changes without complication. With that being said he does still have a significant wound especially over the left lateral lower extremity unfortunately. All of the wounds pretty much are going to require sharp debridement today. 11/11/17 on evaluation today patient appears to be doing more poorly in regard to his left lower extremity in particular. There does not appear to be any evidence of systemic infection although the wound itself as far as the larger left lateral lower extremity ulcer actually appears to be infected in my pinion. There's an older and the surface of the wound is dramatically worsened compared to last week. No fevers, chills, nausea, or vomiting noted at this time. 11/18/17 upon evaluation today patient actually appears to be doing better. I did review his culture today which really did not show any specific organism is a positive reason for his wound decline. There are multiple organisms present not predominant. Nonetheless he seems to be tolerate the doxycycline well in his wounds in general do seem to be doing better. Fortunately there does not  appear to be any evidence of infection at this time which is good news. Overall I'm very pleased with how things appear. Nonetheless he still has a lot of healing to go. I do think he could benefit from a Juxta-Lite wrap. 11/25/17 on evaluation today patient actually appears to be doing fairly well in regard to his wounds. He is still taking the antibiotics he has a few days left. Fortunately this seems to have been excellent for him as far as getting the infection control and very happy in this regard. With that being said the patient likewise is also very pleased with how things appear at this time in comparison to where we were he's not having as much pain. 12/02/17 Seen today for follow p and management of LLE wounds. Wounds appear to show some improvement. He denies pain, fever, or chills. Completed a course of doxycycline earlier this month. Scheduled to received Juxta-Lite wrap this week. No s/s of infections. 12/09/17 on evaluation today patient actually appears to be doing a little bit better in regard to his wounds. This is obscene very slow process and unfortunately he has a couple of new areas and this is due to the Epidermolysis Bullosa. Nonetheless I am concerned about the fact that he seems to be getting more areas not less  that is the reason we're gonna work on getting schedule for the vascular referral to see the venous specialist. 12/23/17 upon evaluation today patient's wounds currently shows evidence of still not doing quite as well is what I would like to have seen. Subsequently the patient did have his venous study which showed evidence of venous stasis. Subsequently I do think that a vascular evaluation for consideration of venous intervention would be appropriate. I'm not necessarily suggesting that will be anything that can be done but I think it is at least a good idea. He is in agreement with this plan. 12/30/17 on evaluation today patient actually appears to be doing very  well in regard to his wounds when compared to previous evaluation. Subsequently we have been using the [facility] Dressing which actually appears to have done excellent on his left lateral lower extremity ulcer. The quality of the wound surface is dramatically improved. There is some slight debridement that is going to be required at a couple of locations but overall I'm extremely pleased with how things appear here. 01/07/2018; this is a patient with a primary skin disorder epidermolyis bullosa. Is a large wound on the left lateral calf and smaller wounds on the right however there is a new wound on the right mid tibia area that occurred within the compression wrap that he did not change. We have been using Hydrofera Blue. On the left he is using Hydrofera Blue and Santyl to the inferior part of the wound and changing the dressing himself. 01/15/2018; primary skin disorder epidermolysis bullosa. He has several difficult wounds including the left lateral calf, smaller wounds on the left medial calf and the right lateral calf. The major area on the left lateral calf has a smaller area inferiorly that has necrotic debris we have been using Santyl to this. The rest of the wounds we have been using Hydrofera Blue. The area on the left calf actually looks larger this week. Uncontrolled edema several small open areas above it that are superficial 01/20/18 on evaluation today patient appears to be doing better as compared to last week in regard to his wounds of the bilateral lower extremities. He tolerated the bilateral compression wrap without complication. Overall I'm very pleased with how things appear at this time. The patient likewise is very happy. 01/27/18 on evaluation today patient appears to be doing decently well in regard to his bilateral lower extremity ulcers. He's been tolerating the dressing changes without complication. One issue he had is that he did have more drainage to the left leg wrapped  last week. He states in fact he probably should come in and let us change it on Friday however he just left it in place and kept adding extra absorption with ABD pads to the external portion of the wrap. Unfortunately he does have some aspiration type breakdown nothing significant but I do believe that this was probably counterproductive in general. Nonetheless his wounds do not appear to be terrible overall. 02/03/18 on evaluation today patient appears to be doing rather well in regard to his lower extremity ulcers. He has been tolerating the dressing changes without complication. He does tell me that he had to change the wrap on the left one since we last saw him. Subsequently I do not see any evidence of infection I do feel like the food was much better controlled at this point. 02/10/18 on evaluation today patient appears to be doing rather well in regard to his ulcers. He still has significant  alterations especially on the left lateral lower extremity. Fortunately there's no signs of infection at this time. Overall I feel like he is making good progress are some areas that I'm gonna attempt some debridement today. 02/17/18 on evaluation today patient appears to be doing okay in regard to his lower Trinity ulcer. It does appear on both locations he has a little bit of drainage causing some breakdown in maceration around the wound bed's although it doesn't appear to be too bad the right is a little bit worse than left. Fortunately there's no signs of infection which is good news. No fevers, chills, nausea, or vomiting noted at this time. 03/10/18 on evaluation today patient actually appears to be doing rather poorly in regard to his bilateral lower extremity ulcers. The right in particular is draining profusely and the wound is actually enlarging which is not good. I'm concerned about both possibly infection and the fact that there's a lot of moisture which is causing breakdown as well. Unfortunately  the patient has been trying to change this at home I'm afraid he may need to change more frequently in order to see the improvement that were looking for. There's no signs of systemic infection. 03/17/18 patient actually appears to be doing significantly better at this point in regard to his bilateral lower extremity ulcers. Fortunately there's no signs of infection. That is worsening infection at least indefinitely nothing systemic. With that being said he is having a lot of drainage though not quite as much is there in his last evaluation. Overall feel like he's on the side of improvement. I think if his results back from his culture which showed that he had a positive group B strep along with abundant Pseudomonas noted on the culture. For that reason I am gonna have him continue with the linezolid as we previously have ordered for him and I did go ahead as well today and prescribe Levaquin as well in order to treat the Pseudomonas portion of the infection noted. 03/24/18 on evaluation today patient actually appears to be doing very well in regard to his lower Trinity ulcer. He's been tolerating the dressing changes without complication. Fortunately both legs show signs of less drainage in his edema is very well controlled at this point as well. Overall very pleased with how things seem to be progressing. 03/31/18 on evaluation today patient actually appears to be doing excellent in regard to his bilateral lower extremity ulcers. These are not draining nearly as significant as what they were in the past overall seem to be shown signs of excellent improvement which is great news. Fortunately there is no sign of active infection at this time I do believe that the Levaquin has done extremely well for him in this regard. The patient continues to change these at home typically every day. We may be able to slowly work towards every other day changes since the drainage seems to be slowing down quite  significantly. 04/14/18 on evaluation today patient appears to be doing well in regard to his bilateral lower extremities. Let me Hilda Blades Almost completely healed which is excellent news. Fortunately he's shown signs of improvement all other sites as well with new skin growth there's some slight hyper granular tissue but for the most part this seems to be well maintained with the [facility] Dressing. I'm very happy in this regard. 04/28/18 on evaluation today patient appears to be doing rather well in regard to his ulcers of the bilateral lower extremities all things considering.  He continues to make some progress as far as new skin growth. There still some hyper granulation noted at this point despite the use of the [facility] Dressing. This is not terrible but I think we may want to consider conclude cauterization today with silver nitrate to try to help knock some of his back as well as helping with any biofilm on the surface of the wound. 05/12/18 on evaluation today patient's wounds actually appear to be doing fairly well in regard to the bilateral lower extremities. He's been tolerating the dressing changes without complication. Fortunately there's no signs of active infection at this time which is good news. Overall very pleased with how things seem to be progressing. You select silver nitrate was beneficial for him. 05/26/18 on evaluation today patient appears to be doing better in regard to left lower extremity and a little bit worse in regard to the right lower extremity. He states that he was pulling off the [facility] Dressing peel back some of the skin making this area significantly larger than what it was previous. He's not had any issues other than this and states even that hasn't caused any pain he just seems to obviously have a much larger area on the right when compared to the previous time I saw him. No fevers, chills, nausea, or vomiting noted at this time. 06/16/18 on  evaluation today patient actually appears to be doing a little better in my pinion in regard to his lower summary ulcers. He has new skin islands that seem to be spreading which is good news. Fortunately there's no signs of active infection at this time. His biggest issue is he tells me that coming as often as he does is becoming very cost prohibitive. He wonders if we can potentially spread this out. 07/14/18 on evaluation today patient appears to be doing a little bit worse in regard to his lower from the ulcer. Unfortunately he still continues to have a significant amount of drainage I think we need to do something to try to help this more. He is still somewhat reluctant to go the route of the Wound VAC although that may be the most appropriate thing for him. No fevers, chills, nausea, or vomiting noted at this time. 08/18/2018 on evaluation today patient actually appears to be doing quite well with regard to his bilateral lower extremity ulcers. I do feel like that currently he is making great progress the care max does seem to be doing a great job at helping to control the moisture he has no maceration or skin breakdown. Again this seems to be an excellent way to go. 1 thing we may want to change is adding collagen to the base of the wound and then the care max over top he is not opposed to this. 09/15/2018 on evaluation today patient appears to be doing well with regard to his bilateral lower extremity ulcers. He is showing some signs of improvement not necessarily in size but definitely in appearance. In fact he has a lot of new skin growing throughout the wounds along the edges as well as in the central portion of the wounds on both lower extremities. Overall I am extremely pleased to see this. 10/20/2018 on evaluation today patient actually appears to be doing quite well with regard to his wounds. They are not measuring significantly smaller but he does have a lot of new epithelization noted as  compared to previous. Fortunately there is no signs of active infection at this time.  No fevers, chills, nausea, vomiting, or diarrhea. 11/17/2018 on evaluation today patient presents for reevaluation concerning his bilateral lower extremity ulcers. Fortunately there is no signs of active infection at this time today. He has been tolerating the dressing changes without complication. No fevers, chills, nausea, vomiting, or diarrhea. Unfortunately in general the patient has not made as much improvement as I would like to have seen up to this point. He has been tolerating the dressing changes without complication and he does an excellent job taking care of his wounds at home in my opinion. The biggest issue I see is that he is just not making the progress that we need to be seeing currently. I think we may want to consider having him seen at a plastic surgery appointment and he has previously seen someone in years past at [facility] in [place]. That is definitely a possibility for [place] to look into at this point. 12/29/2018 on evaluation today patient appears to be doing better in regard to the overall visual appearance of his wounds which do not appear to be as macerated. He does have a much larger skin island in the middle of the left lower extremity ulcer which is doing much better. He tells me the pain is also significantly better. With that being said overall his improvement as far as the size of the wounds is not better but again these are very irregular in change shape quite often. Fortunately there is no evidence of active infection at this time which is great news. He never heard anything from [facility] regarding the plastic surgery referral that we made to them. 01/26/2019 upon evaluation today patient appears to be doing a little better in regard to his wounds today. He has been tolerating the dressing changes again he performs these for the most part on his own. He does a great  job wrapping his legs in my opinion. Unfortunately he has not been able to get down to [facility] to see if there is anything from a plastic surgery standpoint that could be done to help with his legs simply due to the fact that his wife unfortunately sustained a compression fracture in her spine she is seeing Dr. Saintclair Halsted and subsequently is going to be having what sounds to be a kyphoplasty type procedure. With that being said that has not been scheduled yet there is still waiting on an MRI the patient is very busy in fact overly busy trying to help take care of his wife at this point. I completely understand this is more of a strain on him at this time 02/23/2019 upon evaluation today patient actually appears to be making some progress. I am actually very pleased with the overall appearance of his wounds even compared to last evaluation. He seems to be doing quite well. He is taking care of his wife unfortunately she did have a compression fracture she has had the procedure for this but still she has a slow road to recovery. For that reason he still not gone to [facility] for a second opinion in this regard. Obviously the goal there was if there was anything that can be done from a skin graft standpoint or otherwise. 03/23/2019 upon evaluation today patient continues to have issues with lower extremity ulcers. Since the beginning he has made progress but at the same time the wounds unfortunately just will not close. We have been trying to get the patient to [facility] to see a specialist there but  unfortunately with the everything going on with his wife he has not been able to make that appointment time yet he states he may be able to in the next 1-2 months but is not really sure. 04/27/2019 on evaluation today patient appears to be doing a little bit more poorly. His last evaluation. He appears to have some erythema around the edges of the wound at this point. Fortunately there is no  signs of active infection at this time which is good news. No fevers, chills, nausea, vomiting, or diarrhea. 06/08/2019 on evaluation today patient appears to be doing well with regard to his wounds. Overall they are actually measuring smaller compared to the last visit last month. We did treat him for Pseudomonas as well as methicillin-resistant Staph aureus. He was only on the treatment for MRSA however for 7 days as the Cipro was resistant and subsequently we had to place him on doxycycline. Nonetheless I am thinking that we may want to add the doxycycline and just do a month-long treatment considering the longstanding nature of his wounds and see if we get this under better control. 07/13/2019 upon evaluation today patient appears to be doing fairly well in regard to his bilateral lower extremities. There does not appear to be any signs of active infection which is good news. No fevers, chills, nausea, vomiting, or diarrhea. 08/10/2019 upon evaluation today patient appears to be doing about the same with regard to his wounds in general. Unfortunately he is not significantly better although is also not significantly worse which is great news there is no evidence of active infection at this time which is good news. He still dealing with a lot going on with his wife and therefore is not really able to go see anyone at the specialty clinic at [facility] that we have previously set up still. 09/07/2019 on evaluation today patient appears to be doing well with regard to his wounds. In fact this is probably the best that have seen so far in quite a few months. Overall I am very pleased with where things stand at this time. No fevers, chills, nausea, vomiting, or diarrhea. Electronic Signature(s) Signed: 09/07/2019 1:20:45 PM By: Worthy Keeler PA-C Entered By: Worthy Keeler on 09/07/2019 13:20:44 -------------------------------------------------------------------------------- Physical Exam Details Patient  Name: Date of Service: Carlos White 09/07/2019 12:30 PM Medical Record Number: [id number] Patient Account Number: [id number] Date of Birth/Sex: Treating RN: 1940/08/31 (79 y.o. [name] Primary Care Provider: Alton Revere, [place] R[place] Other Clinician: Referring Provider: Treating Provider/Extender: Garret Reddish, MA RY Weeks in Treatment: 41 Constitutional Well-nourished and well-hydrated in no acute distress. Respiratory normal breathing without difficulty. Psychiatric this patient is able to make decisions and demonstrates good insight into disease process. Alert and Oriented x 3. pleasant and cooperative. Notes Upon inspection patient's wounds again showed signs of some good epithelization and granulation fortunately there is no evidence of active infection overall very pleased with where things stand at this point. No fevers, chills, nausea, vomiting, or diarrhea. Electronic Signature(s) Signed: 09/07/2019 1:21:05 PM By: Worthy Keeler PA-C Entered By: Worthy Keeler on 09/07/2019 13:21:04 -------------------------------------------------------------------------------- Physician Orders Details Patient Name: Date of Service: [name] 09/07/2019 12:30 PM Medical Record Number: [id number] Patient Account Number: [id number] Date of Birth/Sex: Treating RN: [birth year] (79 y.o. 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 Dressing Change Frequency Wound #2 Left,Distal,Lateral Lower Leg Change Dressing every other day. Wound #5 Right,Proximal,Lateral Lower Leg Change Dressing every other day. Skin Barriers/Peri-Wound Care Barrier cream - to periwound areas Moisturizing lotion - both legs with dressing changes Wound Cleansing Wound #2 Left,Distal,Lateral Lower Leg May shower and wash wound with soap and water. - with dressing changes Wound #5 Right,Proximal,Lateral Lower Leg May shower and wash wound with soap and water. - with dressing changes Primary Wound Dressing Wound #2 Left,Distal,Lateral Lower Leg Collagen - fibracol - do not need to moisten Wound #5 Right,Proximal,Lateral Lower Leg Collagen - fibracol - do not need to moisten Secondary Dressing Wound #2 Left,Distal,Lateral Lower Leg Kerlix/Rolled Gauze ABD pad - all wounds Zetuvit or Kerramax Wound #5 Right,Proximal,Lateral Lower Leg Kerlix/Rolled Gauze ABD pad - all wounds Zetuvit or Kerramax Edema Control Avoid standing for long periods of time Elevate legs to the level of the heart or above for 30 minutes daily and/or when sitting, a frequency of: - throughout the day Support Garment 20-30 mm/Hg pressure to: - patient to apply juxtalites daily to both legs over ace wraps daily Other: - ACE wrap both legs Electronic Signature(s) Signed: 09/07/2019 6:40:41 PM By: Baruch Gouty RN, BSN Signed: 09/07/2019 7:06:00 PM By: Worthy Keeler PA-C Entered By: Baruch Gouty on 09/07/2019 13:20:26 -------------------------------------------------------------------------------- Problem List Details Patient Name: Date of Service: [name], [name] 09/07/2019 12:30 PM Medical Record Number: [id number] Patient Account Number: [id number] Date of Birth/Sex: Treating RN: 09/09/1940 (79 y.o. 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 09/30/2017 No Yes L97.322 Non-pressure chronic ulcer of left ankle with fat layer exposed 09/30/2017 No Yes L97.822 Non-pressure chronic ulcer of other part of left lower leg with fat layer exposed9/18/2019 No Yes L97.812 Non-pressure chronic ulcer of other part of right lower leg with fat layer 09/30/2017 No Yes exposed I10 Essential (primary) hypertension 09/30/2017 No Yes Inactive Problems Resolved Problems Electronic Signature(s) Signed: 09/07/2019 1:12:19 PM By: Worthy Keeler PA-C Entered By: Worthy Keeler on 09/07/2019 13:12:19 -------------------------------------------------------------------------------- Progress Note Details Patient Name: Date of Service: [name] 09/07/2019 12:30 PM Medical Record Number: [id number] Patient Account Number: [id number] Date of Birth/Sex: Treating RN: Aug 16, 1940 (79 y.o. [name] Primary Care Provider: Alton Revere, [place] RY Other Clinician: Referring Provider: Treating Provider/Extender: Garret Reddish, MA RY Weeks in Treatment: 101 Subjective Chief Complaint Information obtained from Patient Bilateral LE Ulcers History of Present Illness (HPI) 09/30/17 on evaluation today patient presents for initial evaluation and our clinic concerning issues that he has been having with his bilateral lower extremities. He states this has been going on for quite some time at least six months. Currently his regiment has been mainly cleaning the area with peroxide, applying the is foreign ointment, and wrapping  the area with ABD pads and then an ace wrap loosely. He has dealt with issues of this nature he tells me for quite some time. He  does have a history of having had a compound fracture of the left lower extremity which he thinks also makes this a much more difficult area for him to heal. He's previously been told that he had poor vascular flow but this was years ago at [facility] we do not have any of those records at this time. He has a history of Epidermolysis Bullosa which was diagnosed around age 9 and he has been cared for at [facility] since that time. Subsequently he states this is hereditary and two of his children one male and one male also have this as well is one of his grandchildren that he is aware of. He has no evidence of infection necessarily at this point although he does have some necrotic tissue noted on the surface of the wound as far as the largest, left lateral lower extremity ulcer, is concerned. Overall I feel like all things considered he's been taking care of this very well. Obviously he has some fairly significant issues going on at this point in this regard. He does have a history otherwise of hypertension though for the most part other than the compound fracture of the left leg he seems to have been fairly healthy in my pinion. 10/07/17 on evaluation today patient actually appears to be doing better in regard to his bilateral lower extremity ulcers. With that being said he does still have some evidence of slough noted on the surface of the wounds I think the Iodoflex has been beneficial for him. His arterial studies are scheduled for October 2. With that being said I do believe that he is continuing to show signs of good improvement which is at least good news. 10/14/17 on evaluation today patient appears to be doing very well in regard to his lower extremity ulcers. He's definitely made some progress as far as healing is concerned although there still are several open areas that are going to need to be addressed. He did have his arterial study today which fortunately shows good findings with a right ABI of 1.23  with a TBI of 0.86 in the left ABI of 1.28 with a TBI of 0.81. This is good news and will allow [place] to perform debridement as well. 10/23/2017; patient with a large wound on the left lateral calf, sizable area on the left medial malleolus and an area on the right lateral malleolus. He has a new blister consistent with his underlying blistering skin disease just above this area we have been using Iodoflex on the lateral left calf lateral right ankle and collagen on the medial left ankle. We have been using Kerlix Coban wraps 10/28/17 on evaluation today the patient continues to have signs of improvement in regard to the overall appearance of the original wound. Unfortunately he did have some blistering over the right lateral lower extremity which has appeared to rupture on evaluation today and likely some of the dead tissue on the surface needs to be cleaned away the good news is this does not appear to be to significantly deep at this time. 11/04/17 on evaluation today patient actually appears to be doing a little better in regard to his lower extremity ulcers. He has been tolerating the dressing changes without complication. With that being said he does still have a significant wound especially over the left lateral lower extremity unfortunately. All  of the wounds pretty much are going to require sharp debridement today. 11/11/17 on evaluation today patient appears to be doing more poorly in regard to his left lower extremity in particular. There does not appear to be any evidence of systemic infection although the wound itself as far as the larger left lateral lower extremity ulcer actually appears to be infected in my pinion. There's an older and the surface of the wound is dramatically worsened compared to last week. No fevers, chills, nausea, or vomiting noted at this time. 11/18/17 upon evaluation today patient actually appears to be doing better. I did review his culture today which really did not  show any specific organism is a positive reason for his wound decline. There are multiple organisms present not predominant. Nonetheless he seems to be tolerate the doxycycline well in his wounds in general do seem to be doing better. Fortunately there does not appear to be any evidence of infection at this time which is good news. Overall I'm very pleased with how things appear. Nonetheless he still has a lot of healing to go. I do think he could benefit from a Juxta-Lite wrap. 11/25/17 on evaluation today patient actually appears to be doing fairly well in regard to his wounds. He is still taking the antibiotics he has a few days left. Fortunately this seems to have been excellent for him as far as getting the infection control and very happy in this regard. With that being said the patient likewise is also very pleased with how things appear at this time in comparison to where we were he's not having as much pain. 12/02/17 Seen today for follow p and management of LLE wounds. Wounds appear to show some improvement. He denies pain, fever, or chills. Completed a course of doxycycline earlier this month. Scheduled to received Juxta-Lite wrap this week. No s/s of infections. 12/09/17 on evaluation today patient actually appears to be doing a little bit better in regard to his wounds. This is obscene very slow process and unfortunately he has a couple of new areas and this is due to the Epidermolysis Bullosa. Nonetheless I am concerned about the fact that he seems to be getting more areas not less that is the reason we're gonna work on getting schedule for the vascular referral to see the venous specialist. 12/23/17 upon evaluation today patient's wounds currently shows evidence of still not doing quite as well is what I would like to have seen. Subsequently the patient did have his venous study which showed evidence of venous stasis. Subsequently I do think that a vascular evaluation for consideration of  venous intervention would be appropriate. I'm not necessarily suggesting that will be anything that can be done but I think it is at least a good idea. He is in agreement with this plan. 12/30/17 on evaluation today patient actually appears to be doing very well in regard to his wounds when compared to previous evaluation. Subsequently we have been using the [facility] Dressing which actually appears to have done excellent on his left lateral lower extremity ulcer. The quality of the wound surface is dramatically improved. There is some slight debridement that is going to be required at a couple of locations but overall I'm extremely pleased with how things appear here. 01/07/2018; this is a patient with a primary skin disorder epidermolyis bullosa. Is a large wound on the left lateral calf and smaller wounds on the right however there is a new wound on the right mid  tibia area that occurred within the compression wrap that he did not change. We have been using Hydrofera Blue. On the left he is using Hydrofera Blue and Santyl to the inferior part of the wound and changing the dressing himself. 01/15/2018; primary skin disorder epidermolysis bullosa. He has several difficult wounds including the left lateral calf, smaller wounds on the left medial calf and the right lateral calf. The major area on the left lateral calf has a smaller area inferiorly that has necrotic debris we have been using Santyl to this. The rest of the wounds we have been using Hydrofera Blue. The area on the left calf actually looks larger this week. Uncontrolled edema several small open areas above it that are superficial 01/20/18 on evaluation today patient appears to be doing better as compared to last week in regard to his wounds of the bilateral lower extremities. He tolerated the bilateral compression wrap without complication. Overall I'm very pleased with how things appear at this time. The patient likewise is very  happy. 01/27/18 on evaluation today patient appears to be doing decently well in regard to his bilateral lower extremity ulcers. He's been tolerating the dressing changes without complication. One issue he had is that he did have more drainage to the left leg wrapped last week. He states in fact he probably should come in and let us change it on Friday however he just left it in place and kept adding extra absorption with ABD pads to the external portion of the wrap. Unfortunately he does have some aspiration type breakdown nothing significant but I do believe that this was probably counterproductive in general. Nonetheless his wounds do not appear to be terrible overall. 02/03/18 on evaluation today patient appears to be doing rather well in regard to his lower extremity ulcers. He has been tolerating the dressing changes without complication. He does tell me that he had to change the wrap on the left one since we last saw him. Subsequently I do not see any evidence of infection I do feel like the food was much better controlled at this point. 02/10/18 on evaluation today patient appears to be doing rather well in regard to his ulcers. He still has significant alterations especially on the left lateral lower extremity. Fortunately there's no signs of infection at this time. Overall I feel like he is making good progress are some areas that I'm gonna attempt some debridement today. 02/17/18 on evaluation today patient appears to be doing okay in regard to his lower Trinity ulcer. It does appear on both locations he has a little bit of drainage causing some breakdown in maceration around the wound bed's although it doesn't appear to be too bad the right is a little bit worse than left. Fortunately there's no signs of infection which is good news. No fevers, chills, nausea, or vomiting noted at this time. 03/10/18 on evaluation today patient actually appears to be doing rather poorly in regard to his  bilateral lower extremity ulcers. The right in particular is draining profusely and the wound is actually enlarging which is not good. I'm concerned about both possibly infection and the fact that there's a lot of moisture which is causing breakdown as well. Unfortunately the patient has been trying to change this at home I'm afraid he may need to change more frequently in order to see the improvement that were looking for. There's no signs of systemic infection. 03/17/18 patient actually appears to be doing significantly better at this point in  regard to his bilateral lower extremity ulcers. Fortunately there's no signs of infection. That is worsening infection at least indefinitely nothing systemic. With that being said he is having a lot of drainage though not quite as much is there in his last evaluation. Overall feel like he's on the side of improvement. I think if his results back from his culture which showed that he had a positive group B strep along with abundant Pseudomonas noted on the culture. For that reason I am gonna have him continue with the linezolid as we previously have ordered for him and I did go ahead as well today and prescribe Levaquin as well in order to treat the Pseudomonas portion of the infection noted. 03/24/18 on evaluation today patient actually appears to be doing very well in regard to his lower Trinity ulcer. He's been tolerating the dressing changes without complication. Fortunately both legs show signs of less drainage in his edema is very well controlled at this point as well. Overall very pleased with how things seem to be progressing. 03/31/18 on evaluation today patient actually appears to be doing excellent in regard to his bilateral lower extremity ulcers. These are not draining nearly as significant as what they were in the past overall seem to be shown signs of excellent improvement which is great news. Fortunately there is no sign of active infection at this  time I do believe that the Levaquin has done extremely well for him in this regard. The patient continues to change these at home typically every day. We may be able to slowly work towards every other day changes since the drainage seems to be slowing down quite significantly. 04/14/18 on evaluation today patient appears to be doing well in regard to his bilateral lower extremities. Let me [facility] Almost completely healed which is excellent news. Fortunately he's shown signs of improvement all other sites as well with new skin growth there's some slight hyper granular tissue but for the most part this seems to be well maintained with the [facility] Dressing. I'm very happy in this regard. 04/28/18 on evaluation today patient appears to be doing rather well in regard to his ulcers of the bilateral lower extremities all things considering. He continues to make some progress as far as new skin growth. There still some hyper granulation noted at this point despite the use of the [facility] Dressing. This is not terrible but I think we may want to consider conclude cauterization today with silver nitrate to try to help knock some of his back as well as helping with any biofilm on the surface of the wound. 05/12/18 on evaluation today patient's wounds actually appear to be doing fairly well in regard to the bilateral lower extremities. He's been tolerating the dressing changes without complication. Fortunately there's no signs of active infection at this time which is good news. Overall very pleased with how things seem to be progressing. You select silver nitrate was beneficial for him. 05/26/18 on evaluation today patient appears to be doing better in regard to left lower extremity and a little bit worse in regard to the right lower extremity. He states that he was pulling off the [facility] Dressing peel back some of the skin making this area significantly larger than what it was previous. He's  not had any issues other than this and states even that hasn't caused any pain he just seems to obviously have a much larger area on the right when compared to the previous time  I saw him. No fevers, chills, nausea, or vomiting noted at this time. 06/16/18 on evaluation today patient actually appears to be doing a little better in my pinion in regard to his lower summary ulcers. He has new skin islands that seem to be spreading which is good news. Fortunately there's no signs of active infection at this time. His biggest issue is he tells me that coming as often as he does is becoming very cost prohibitive. He wonders if we can potentially spread this out. 07/14/18 on evaluation today patient appears to be doing a little bit worse in regard to his lower from the ulcer. Unfortunately he still continues to have a significant amount of drainage I think we need to do something to try to help this more. He is still somewhat reluctant to go the route of the Wound VAC although that may be the most appropriate thing for him. No fevers, chills, nausea, or vomiting noted at this time. 08/18/2018 on evaluation today patient actually appears to be doing quite well with regard to his bilateral lower extremity ulcers. I do feel like that currently he is making great progress the care max does seem to be doing a great job at helping to control the moisture he has no maceration or skin breakdown. Again this seems to be an excellent way to go. 1 thing we may want to change is adding collagen to the base of the wound and then the care max over top he is not opposed to this. 09/15/2018 on evaluation today patient appears to be doing well with regard to his bilateral lower extremity ulcers. He is showing some signs of improvement not necessarily in size but definitely in appearance. In fact he has a lot of new skin growing throughout the wounds along the edges as well as in the central portion of the wounds on both lower  extremities. Overall I am extremely pleased to see this. 10/20/2018 on evaluation today patient actually appears to be doing quite well with regard to his wounds. They are not measuring significantly smaller but he does have a lot of new epithelization noted as compared to previous. Fortunately there is no signs of active infection at this time. No fevers, chills, nausea, vomiting, or diarrhea. 11/17/2018 on evaluation today patient presents for reevaluation concerning his bilateral lower extremity ulcers. Fortunately there is no signs of active infection at this time today. He has been tolerating the dressing changes without complication. No fevers, chills, nausea, vomiting, or diarrhea. Unfortunately in general the patient has not made as much improvement as I would like to have seen up to this point. He has been tolerating the dressing changes without complication and he does an excellent job taking care of his wounds at home in my opinion. The biggest issue I see is that he is just not making the progress that we need to be seeing currently. I think we may want to consider having him seen at a plastic surgery appointment and he has previously seen someone in years past at [facility] in [place]. That is definitely a possibility for [place] to look into at this point. 12/29/2018 on evaluation today patient appears to be doing better in regard to the overall visual appearance of his wounds which do not appear to be as macerated. He does have a much larger skin island in the middle of the left lower extremity ulcer which is doing much better. He tells me the pain is also significantly better.  With that being said overall his improvement as far as the size of the wounds is not better but again these are very irregular in change shape quite often. Fortunately there is no evidence of active infection at this time which is great news. He never heard anything from [facility] regarding  the plastic surgery referral that we made to them. 01/26/2019 upon evaluation today patient appears to be doing a little better in regard to his wounds today. He has been tolerating the dressing changes again he performs these for the most part on his own. He does a great job wrapping his legs in my opinion. Unfortunately he has not been able to get down to [facility] to see if there is anything from a plastic surgery standpoint that could be done to help with his legs simply due to the fact that his wife unfortunately sustained a compression fracture in her spine she is seeing Dr. Saintclair Halsted and subsequently is going to be having what sounds to be a kyphoplasty type procedure. With that being said that has not been scheduled yet there is still waiting on an MRI the patient is very busy in fact overly busy trying to help take care of his wife at this point. I completely understand this is more of a strain on him at this time 02/23/2019 upon evaluation today patient actually appears to be making some progress. I am actually very pleased with the overall appearance of his wounds even compared to last evaluation. He seems to be doing quite well. He is taking care of his wife unfortunately she did have a compression fracture she has had the procedure for this but still she has a slow road to recovery. For that reason he still not gone to [facility] for a second opinion in this regard. Obviously the goal there was if there was anything that can be done from a skin graft standpoint or otherwise. 03/23/2019 upon evaluation today patient continues to have issues with lower extremity ulcers. Since the beginning he has made progress but at the same time the wounds unfortunately just will not close. We have been trying to get the patient to [facility] to see a specialist there but unfortunately with the everything going on with his wife he has not been able to make that appointment time yet he states he  may be able to in the next 1-2 months but is not really sure. 04/27/2019 on evaluation today patient appears to be doing a little bit more poorly. His last evaluation. He appears to have some erythema around the edges of the wound at this point. Fortunately there is no signs of active infection at this time which is good news. No fevers, chills, nausea, vomiting, or diarrhea. 06/08/2019 on evaluation today patient appears to be doing well with regard to his wounds. Overall they are actually measuring smaller compared to the last visit last month. We did treat him for Pseudomonas as well as methicillin-resistant Staph aureus. He was only on the treatment for MRSA however for 7 days as the Cipro was resistant and subsequently we had to place him on doxycycline. Nonetheless I am thinking that we may want to add the doxycycline and just do a month-long treatment considering the longstanding nature of his wounds and see if we get this under better control. 07/13/2019 upon evaluation today patient appears to be doing fairly well in regard to his bilateral lower extremities. There does not appear to be any signs of active  infection which is good news. No fevers, chills, nausea, vomiting, or diarrhea. 08/10/2019 upon evaluation today patient appears to be doing about the same with regard to his wounds in general. Unfortunately he is not significantly better although is also not significantly worse which is great news there is no evidence of active infection at this time which is good news. He still dealing with a lot going on with his wife and therefore is not really able to go see anyone at the specialty clinic at [facility] that we have previously set up still. 09/07/2019 on evaluation today patient appears to be doing well with regard to his wounds. In fact this is probably the best that have seen so far in quite a few months. Overall I am very pleased with where things stand at this time. No fevers, chills,  nausea, vomiting, or diarrhea. Objective Constitutional Well-nourished and well-hydrated in no acute distress. Vitals Time Taken: 12:48 PM, Height: 71 in, Weight: 220 lbs, BMI: 30.7, Temperature: 97.8 F, Pulse: 68 bpm, Respiratory Rate: 16 breaths/min, Blood Pressure: 143/61 mmHg. Respiratory normal breathing without difficulty. Psychiatric this patient is able to make decisions and demonstrates good insight into disease process. Alert and Oriented x 3. pleasant and cooperative. General Notes: Upon inspection patient's wounds again showed signs of some good epithelization and granulation fortunately there is no evidence of active infection overall very pleased with where things stand at this point. No fevers, chills, nausea, vomiting, or diarrhea. Integumentary (Hair, Skin) Wound #2 status is Open. Original cause of wound was Gradually Appeared. The wound is located on the Left,Distal,Lateral Lower Leg. The wound measures 17cm length x 12cm width x 0.1cm depth; 160.221cm^2 area and 16.022cm^3 volume. There is Fat Layer (Subcutaneous Tissue) exposed. There is no tunneling or undermining noted. There is a large amount of serosanguineous drainage noted. The wound margin is flat and intact. There is large (67-100%) red granulation within the wound bed. There is a small (1-33%) amount of necrotic tissue within the wound bed including Adherent Slough. Wound #5 status is Open. Original cause of wound was Gradually Appeared. The wound is located on the Right,Proximal,Lateral Lower Leg. The wound measures 10.3cm length x 15cm width x 0.1cm depth; 121.344cm^2 area and 12.134cm^3 volume. There is Fat Layer (Subcutaneous Tissue) exposed. There is no tunneling or undermining noted. There is a large amount of serosanguineous drainage noted. The wound margin is flat and intact. There is large (67-100%) red granulation within the wound bed. There is no necrotic tissue within the wound bed. 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 Dressing Change Frequency: Wound #2 Left,Distal,Lateral Lower Leg: Change Dressing every other day. Wound #5 Right,Proximal,Lateral Lower Leg: Change Dressing every other day. Skin Barriers/Peri-Wound Care: Barrier cream - to periwound areas Moisturizing lotion - both legs with dressing changes Wound Cleansing: Wound #2 Left,Distal,Lateral Lower Leg: May shower and wash wound with soap and water. - with dressing changes Wound #5 Right,Proximal,Lateral Lower Leg: May shower and wash wound with soap and water. - with dressing changes Primary Wound Dressing: Wound #2 Left,Distal,Lateral Lower Leg: Collagen - fibracol - do not need to moisten Wound #5 Right,Proximal,Lateral Lower Leg: Collagen - fibracol - do not  need to moisten Secondary Dressing: Wound #2 Left,Distal,Lateral Lower Leg: Kerlix/Rolled Gauze ABD pad - all wounds Zetuvit or Kerramax Wound #5 Right,Proximal,Lateral Lower Leg: Kerlix/Rolled Gauze ABD pad - all wounds Zetuvit or Kerramax Edema Control: Avoid standing for long periods of time Elevate legs to the level of the heart or above for 30 minutes daily and/or when sitting, a frequency of: - throughout the day Support Garment 20-30 mm/Hg pressure to: - patient to apply juxtalites daily to both legs over ace wraps daily Other: - ACE wrap both legs 1. I am going recommend currently that we go ahead and continue with the current measures including the collagen followed by the absorptive pads. He seems to be doing well in that regard. 2. I am also can  recommend that we continue with the use of his Velcro compression wraps which I also think are helpful at this point. He is doing this over top of the Ace wraps once he gets his dressings in place. We will see patient back for reevaluation in 4 weeks here in the clinic. If anything worsens or changes patient will contact our office for additional recommendations. Electronic Signature(s) Signed: 09/07/2019 1:21:32 PM By: Worthy Keeler PA-C Entered By: Worthy Keeler on 09/07/2019 13:21:31 -------------------------------------------------------------------------------- SuperBill Details Patient Name: Date of Service: Carlos White 09/07/2019 Medical Record Number: [id number] Patient Account Number: [id number] Date of Birth/Sex: Treating RN: [id number] (79 y.o. [name] Primary Care Provider: Alton Revere, [place] RY Other Clinician: Referring Provider: Treating Provider/Extender: Garret Reddish, MA RY Weeks in Treatment: 101 Diagnosis Coding ICD-10 Codes Code Description I87.2 Venous insufficiency (chronic) (peripheral) Q81.8 Other epidermolysis bullosa L97.322 Non-pressure chronic ulcer of left ankle with fat layer exposed L97.822 Non-pressure chronic ulcer of other part of left lower leg with fat layer exposed L97.812 Non-pressure chronic ulcer of other part of right lower leg with fat layer exposed I10 Essential (primary) hypertension Facility Procedures CPT4 Code: [id number] Description: 99214 - WOUND CARE VISIT-LEV 4 EST PT Modifier: Quantity: 1 Physician Procedures : CPT4 Code Description Modifier [id number] - WC PHYS LEVEL 3 - EST PT ICD-10 Diagnosis Description I87.2 Venous insufficiency (chronic) (peripheral) Q81.8 Other epidermolysis bullosa L97.322 Non-pressure chronic ulcer of left ankle with fat layer  exposed L97.822 Non-pressure chronic ulcer of other part of left lower leg with fat layer exposed Quantity: 1 Electronic Signature(s) Signed: 09/07/2019  1:25:09 PM By: Worthy Keeler PA-C Entered By: Worthy Keeler on 09/07/2019 13:25:09

## 2019-09-08 NOTE — Progress Notes (Signed)
VERTIS, SCHEIB ([id number]) Visit Report for 09/07/2019 Arrival Information Details Patient Name: Date of Service: Carlos White 09/07/2019 12:30 PM Medical Record Number: [id number] Patient Account Number: [id number] Date of Birth/Sex: Treating RN: 07-Feb-1940 (79 y.o. [name] Primary Care Janiece Scovill: [place], [place] RY Other Clinician: Referring Dawn Convery: Treating Balin Vandegrift/Extender: Garret Reddish, MA RY Weeks in Treatment: 101 Visit Information History Since Last Visit Added or deleted any medications: No Patient Arrived: Ambulatory Any new allergies or adverse reactions: No Arrival Time: 12:48 Had a fall or experienced change in No Accompanied By: self activities of daily living that may affect Transfer Assistance: None risk of falls: Patient Identification Verified: Yes Signs or symptoms of abuse/neglect since last visito No Secondary Verification Process Completed: Yes Hospitalized since last visit: No Patient Requires Transmission-Based Precautions: No Implantable device outside of the clinic excluding No Patient Has Alerts: Yes cellular tissue based products placed in the center Patient Alerts: R ABI= 1.23, TBI = .86 since last visit: L ABI= 1.28, TBI=.81 Has Dressing in Place as Prescribed: Yes Pain Present Now: No Electronic Signature(s) Signed: 09/08/2019 10:16:14 AM By: Sandre Kitty Entered By: Sandre Kitty on 09/07/2019 12:48:54 -------------------------------------------------------------------------------- Clinic Level of Care Assessment Details Patient Name: Date of Service: Carlos White 09/07/2019 12:30 PM Medical Record Number: [id number] Patient Account Number: [id number] Date of Birth/Sex: Treating RN: Jun 11, 1940 (79 y.o. [name] Primary Care Kortlyn Koltz: [place], [place] RY Other Clinician: Referring Gale Hulse: Treating Janit Cutter/Extender: Garret Reddish, MA RY Weeks in Treatment: 101 Clinic Level of Care  Assessment Items TOOL 4 Quantity Score _0  - 0 Use when only an EandM is performed on FOLLOW-UP visit ASSESSMENTS - Nursing Assessment / Reassessment X- 1 10 Reassessment of Co-morbidities (includes updates in patient status) X- 1 5 Reassessment of Adherence to Treatment Plan ASSESSMENTS - Wound and Skin A ssessment / Reassessment _1  - 0 Simple Wound Assessment / Reassessment - one wound X- 2 5 Complex Wound Assessment / Reassessment - multiple wounds _2  - 0 Dermatologic / Skin Assessment (not related to wound area) ASSESSMENTS - Focused Assessment X- 2 5 Circumferential Edema Measurements - multi extremities _3  - 0 Nutritional Assessment / Counseling / Intervention X- 1 5 Lower Extremity Assessment (monofilament, tuning fork, pulses) _4  - 0 Peripheral Arterial Disease Assessment (using hand held doppler) ASSESSMENTS - Ostomy and/or Continence Assessment and Care _5  - 0 Incontinence Assessment and Management _6  - 0 Ostomy Care Assessment and Management (repouching, etc.) PROCESS - Coordination of Care X - Simple Patient / Family Education for ongoing care 1 15 _7  - 0 Complex (extensive) Patient / Family Education for ongoing care _8  - 0 Staff obtains Programmer, systems, Records, T Results / Process Orders est _9  - 0 Staff telephones HHA, Nursing Homes / Clarify orders / etc _10  - 0 Routine Transfer to another Facility (non-emergent condition) _11  - 0 Routine Hospital Admission (non-emergent condition) _12  - 0 New Admissions / Biomedical engineer / Ordering NPWT Apligraf, etc. , _13  - 0 Emergency Hospital Admission (emergent condition) X- 1 10 Simple Discharge Coordination _14  - 0 Complex (extensive) Discharge Coordination PROCESS - Special Needs _15  - 0 Pediatric / Minor Patient Management _16  - 0 Isolation Patient Management _17  - 0 Hearing / Language / Visual special needs _18  - 0 Assessment of Community assistance (transportation, D/C planning, etc.) _19  -  0 Additional assistance / Altered mentation _20  - 0 Support Surface(s) Assessment (bed, cushion, seat, etc.) INTERVENTIONS -  Wound Cleansing / Measurement _0  - 0 Simple Wound Cleansing - one wound X- 2 5 Complex Wound Cleansing - multiple wounds X- 1 5 Wound Imaging (photographs - any number of wounds) _1  - 0 Wound Tracing (instead of photographs) _2  - 0 Simple Wound Measurement - one wound X- 2 5 Complex Wound Measurement - multiple wounds INTERVENTIONS - Wound Dressings _3  - 0 Small Wound Dressing one or multiple wounds X- 2 15 Medium Wound Dressing one or multiple wounds _4  - 0 Large Wound Dressing one or multiple wounds X- 1 5 Application of Medications - topical <[id number]>_4<\/[id number]>_3  - 0 Application of Medications - injection INTERVENTIONS - Miscellaneous _6  - 0 External ear exam _7  - 0 Specimen Collection (cultures, biopsies, blood, body fluids, etc.) _8  - 0 Specimen(s) / Culture(s) sent or taken to Lab for analysis _9  - 0 Patient Transfer (multiple staff / Civil Service fast streamer / Similar devices) _10  - 0 Simple Staple / Suture removal (25 or less) _11  - 0 Complex Staple / Suture removal (26 or more) _12  - 0 Hypo / Hyperglycemic Management (close monitor of Blood Glucose) _13  - 0 Ankle / Brachial Index (ABI) - do not check if billed separately X- 1 5 Vital Signs Has the patient been seen at the hospital within the last three years: Yes Total Score: 130 Level Of Care: New/Established - Level 4 Electronic Signature(s) Signed: 09/07/2019 6:40:41 PM By: Baruch Gouty RN, BSN Entered By: Baruch Gouty on 09/07/2019 13:19:04 -------------------------------------------------------------------------------- Encounter Discharge Information Details Patient Name: Date of Service: [name], [name] 09/07/2019 12:30 PM Medical Record Number: [id number] Patient Account Number: [id number] Date of Birth/Sex: Treating RN: 07/24/1940 (79 y.o. [name] Primary Care Isbella Arline: [place], [place] RY  Other Clinician: Referring Arley Garant: Treating Lovie Agresta/Extender: Garret Reddish, MA RY Weeks in Treatment: [id number] Encounter Discharge Information Items Discharge Condition: Stable Ambulatory Status: Ambulatory Discharge Destination: Home Transportation: Private Auto Accompanied By: self Schedule Follow-up Appointment: Yes Clinical Summary of Care: Patient Declined Electronic Signature(s) Signed: 09/07/2019 2:46:37 PM By: Kela Millin Entered By: Kela Millin on 09/07/2019 13:34:51 -------------------------------------------------------------------------------- Lower Extremity Assessment Details Patient Name: Date of Service: Carlos White 09/07/2019 12:30 PM Medical Record Number: [id number] Patient Account Number: [id number] Date of Birth/Sex: Treating RN: 02-24-1940 (79 y.o. [name] Primary Care Letonia Stead: [place], [place] RY Other Clinician: Referring Sohail Capraro: Treating Trinady Milewski/Extender: Garret Reddish, MA RY Weeks in Treatment: 101 Edema Assessment Assessed: [Left: No] [Right: No] Edema: [Left: Yes] [Right: Yes] Calf Left: Right: Point of Measurement: 35.5 cm From Medial Instep 39 cm 39.2 cm Ankle Left: Right: Point of Measurement: 11 cm From Medial Instep 28.7 cm 30 cm Vascular Assessment Pulses: Dorsalis Pedis Palpable: [Left:Yes] [Right:Yes] Electronic Signature(s) Signed: 09/07/2019 2:46:37 PM By: Kela Millin Entered By: Kela Millin on 09/07/2019 13:01:54 -------------------------------------------------------------------------------- Multi-Disciplinary Care Plan Details Patient Name: Date of Service: [name], [name] 09/07/2019 12:30 PM Medical Record Number: [id number] Patient Account Number: [id number] Date of Birth/Sex: Treating RN: [birth year] (79 y.o. [name] Primary Care Chaquetta Schlottman: [place], [place] RY Other Clinician: Referring Kharson Rasmusson: Treating Pheonix Clinkscale/Extender: Garret Reddish, MA  RY Weeks in Treatment: [phone] Active Inactive Venous Leg Ulcer Nursing Diagnoses: Knowledge deficit related to disease process and management Potential for venous Insuffiency (use before diagnosis confirmed) Goals: Patient will maintain optimal edema control Date Initiated: 09/30/2017 Target Resolution Date: 10/05/2019 Goal Status: Active Patient/caregiver will verbalize understanding of disease process and disease management  Date Initiated: 09/30/2017 Date Inactivated: 11/04/2017 Target Resolution Date: 10/30/2017 Goal Status: Met Interventions: Assess peripheral edema status every visit. Provide education on venous insufficiency Notes: Wound/Skin Impairment Nursing Diagnoses: Impaired tissue integrity Knowledge deficit related to ulceration/compromised skin integrity Goals: Patient/caregiver will verbalize understanding of skin care regimen Date Initiated: 09/30/2017 Target Resolution Date: 10/05/2019 Goal Status: Active Ulcer/skin breakdown will have a volume reduction of 30% by week 4 Date Initiated: 09/30/2017 Date Inactivated: 11/04/2017 Target Resolution Date: 10/30/2017 Goal Status: Met Interventions: Assess patient/caregiver ability to obtain necessary supplies Assess patient/caregiver ability to perform ulcer/skin care regimen upon admission and as needed Assess ulceration(s) every visit Provide education on ulcer and skin care Notes: Electronic Signature(s) Signed: 09/07/2019 6:40:41 PM By: Baruch Gouty RN, BSN Entered By: Baruch Gouty on 09/07/2019 13:17:42 -------------------------------------------------------------------------------- Pain Assessment Details Patient Name: Date of Service: [name] 09/07/2019 12:30 PM Medical Record Number: [id number] Patient Account Number: [id number] Date of Birth/Sex: Treating RN: [birth year] (79 y.o. [name] Primary Care Burnham Trost: [place], [place] RY Other Clinician: Referring Shavanna Furnari: Treating  Antonio Woodhams/Extender: Garret Reddish, MA RY Weeks in Treatment: [phone] Active Problems Location of Pain Severity and Description of Pain Patient Has Paino No Site Locations Pain Management and Medication Current Pain Management: Electronic Signature(s) Signed: 09/07/2019 6:40:41 PM By: Baruch Gouty RN, BSN Signed: 09/08/2019 10:16:14 AM By: Sandre Kitty Entered By: Sandre Kitty on 09/07/2019 12:52:02 -------------------------------------------------------------------------------- Patient/Caregiver Education Details Patient Name: Date of Service: Carlos White 8/25/2021andnbsp12:30 PM Medical Record Number: [id number] Patient Account Number: [id number] Date of Birth/Gender: Treating RN: 08/31/1940 (79 y.o. [name] Primary Care Physician: [place], [place] RY Other Clinician: Referring Physician: Treating Physician/Extender: Garret Reddish, MA RY Weeks in Treatment: [id number] Education Assessment Education Provided To: Patient Education Topics Provided Venous: Methods: Explain/Verbal Responses: Reinforcements needed, State content correctly Wound/Skin Impairment: Methods: Explain/Verbal Responses: Reinforcements needed, State content correctly Electronic Signature(s) Signed: 09/07/2019 6:40:41 PM By: Baruch Gouty RN, BSN Entered By: Baruch Gouty on 09/07/2019 13:18:17 -------------------------------------------------------------------------------- Wound Assessment Details Patient Name: Date of Service: [name] 09/07/2019 12:30 PM Medical Record Number: [id number] Patient Account Number: [id number] Date of Birth/Sex: Treating RN: 1940/01/29 (79 y.o. [name] Primary Care Jessiah Wojnar: [place], [place] R[place] Other Clinician: Referring Malcolm Hetz: Treating Adalia Pettis/Extender: Garret Reddish, MA RY Weeks in Treatment: 101 Wound Status Wound Number: 2 Primary Venous Leg Ulcer Etiology: Wound Location: Left, Distal, Lateral  Lower Leg Wound Status: Open Wounding Event: Gradually Appeared Comorbid Cataracts, Chronic Obstructive Pulmonary Disease (COPD), Date Acquired: 03/13/2017 History: Hypertension Weeks Of Treatment: 101 Clustered Wound: No Wound Measurements Length: (cm) 17 Width: (cm) 12 Depth: (cm) 0.1 Area: (cm) 160.221 Volume: (cm) 16.022 % Reduction in Area: -106.7% % Reduction in Volume: 58.7% Epithelialization: Small (1-33%) Tunneling: No Undermining: No Wound Description Classification: Full Thickness Without Exposed Support Structures Wound Margin: Flat and Intact Exudate Amount: Large Exudate Type: Serosanguineous Exudate Color: red, brown Foul Odor After Cleansing: No Slough/Fibrino Yes Wound Bed Granulation Amount: Large (67-100%) Exposed Structure Granulation Quality: Red Fascia Exposed: No Necrotic Amount: Small (1-33%) Fat Layer (Subcutaneous Tissue) Exposed: Yes Necrotic Quality: Adherent Slough Tendon Exposed: No Muscle Exposed: No Joint Exposed: No Bone Exposed: No Treatment Notes Wound #2 (Left, Distal, Lateral Lower Leg) 1. Cleanse With Wound Cleanser Soap and water 3. Primary Dressing Applied Collagen 4. Secondary Dressing ABD Pad Roll Gauze Kerramax/Xtrasorb 6. Support Layer Education officer, community) Signed:  09/07/2019 2:46:37 PM By: Kela Millin Signed: 09/07/2019 6:40:41 PM By: Baruch Gouty RN, BSN Entered By: Kela Millin on 09/07/2019 13:02:11 -------------------------------------------------------------------------------- Wound Assessment Details Patient Name: Date of Service: [name] 09/07/2019 12:30 PM Medical Record Number: [id number] Patient Account Number: [id number] Date of Birth/Sex: Treating RN: Feb 22, 1940 (79 y.o. [name] Primary Care Shiryl Ruddy: [place], [place] RY Other Clinician: Referring Olga Seyler: Treating Kysean Sweet/Extender: Garret Reddish, MA RY Weeks in Treatment: 101 Wound  Status Wound Number: 5 Primary Vasculopathy Etiology: Wound Location: Right, Proximal, Lateral Lower Leg Wound Status: Open Wounding Event: Gradually Appeared Comorbid Cataracts, Chronic Obstructive Pulmonary Disease (COPD), Date Acquired: 10/26/2017 History: Hypertension Weeks Of Treatment: 97 Clustered Wound: Yes Wound Measurements Length: (cm) 10.3 Width: (cm) 15 Depth: (cm) 0.1 Clustered Quantity: 2 Area: (cm) 121.344 Volume: (cm) 12.134 % Reduction in Area: -123.5% % Reduction in Volume: -123.5% Epithelialization: Small (1-33%) Tunneling: No Undermining: No Wound Description Classification: Full Thickness Without Exposed Support Structures Wound Margin: Flat and Intact Exudate Amount: Large Exudate Type: Serosanguineous Exudate Color: red, brown Foul Odor After Cleansing: No Slough/Fibrino Yes Wound Bed Granulation Amount: Large (67-100%) Exposed Structure Granulation Quality: Red Fascia Exposed: No Necrotic Amount: None Present (0%) Fat Layer (Subcutaneous Tissue) Exposed: Yes Tendon Exposed: No Muscle Exposed: No Joint Exposed: No Bone Exposed: No Treatment Notes Wound #5 (Right, Proximal, Lateral Lower Leg) 1. Cleanse With Wound Cleanser Soap and water 3. Primary Dressing Applied Collagen 4. Secondary Dressing ABD Pad Roll Gauze Kerramax/Xtrasorb 6. Support Layer Education officer, community) Signed: 09/07/2019 2:46:37 PM By: Kela Millin Signed: 09/07/2019 6:40:41 PM By: Baruch Gouty RN, BSN Entered By: Kela Millin on 09/07/2019 13:02:30 -------------------------------------------------------------------------------- Vitals Details Patient Name: Date of Service: [name], GO RDO N H. 09/07/2019 12:30 PM Medical Record Number: [id number] Patient Account Number: [id number] Date of Birth/Sex: Treating RN: 1940/09/06 (79 y.o. [name] Primary Care Kavonte Bearse: [place], [place] RY Other Clinician: Referring  Orlanda Frankum: Treating Nicie Milan/Extender: Garret Reddish, MA RY Weeks in Treatment: 101 Vital Signs Time Taken: 12:48 Temperature (F): 97.8 Height (in): 71 Pulse (bpm): 68 Weight (lbs): 220 Respiratory Rate (breaths/min): 16 Body Mass Index (BMI): 30.7 Blood Pressure (mmHg): 143/61 Reference Range: 80 - 120 mg / dl Electronic Signature(s) Signed: 09/08/2019 10:16:14 AM By: Sandre Kitty Entered By: Sandre Kitty on 09/07/2019 12:51:52

## 2019-09-09 ENCOUNTER — Telehealth: Payer: Self-pay | Admitting: Internal Medicine

## 2019-09-09 NOTE — Telephone Encounter (Signed)
Completed form for [facility], so that patient could have assistance with his trash containers being brought to curbside.

## 2019-09-12 DIAGNOSIS — I87312 Chronic venous hypertension (idiopathic) with ulcer of left lower extremity: Secondary | ICD-10-CM | POA: Diagnosis not present

## 2019-09-13 DIAGNOSIS — H353132 Nonexudative age-related macular degeneration, bilateral, intermediate dry stage: Secondary | ICD-10-CM | POA: Diagnosis not present

## 2019-09-13 DIAGNOSIS — H35033 Hypertensive retinopathy, bilateral: Secondary | ICD-10-CM | POA: Diagnosis not present

## 2019-09-13 DIAGNOSIS — H43813 Vitreous degeneration, bilateral: Secondary | ICD-10-CM | POA: Diagnosis not present

## 2019-10-02 ENCOUNTER — Other Ambulatory Visit: Payer: Self-pay | Admitting: Internal Medicine

## 2019-10-05 ENCOUNTER — Encounter (HOSPITAL_BASED_OUTPATIENT_CLINIC_OR_DEPARTMENT_OTHER): Payer: Medicare PPO | Attending: Physician Assistant | Admitting: Physician Assistant

## 2019-10-05 ENCOUNTER — Other Ambulatory Visit: Payer: Self-pay

## 2019-10-05 ENCOUNTER — Other Ambulatory Visit (HOSPITAL_COMMUNITY)
Admission: RE | Admit: 2019-10-05 | Discharge: 2019-10-05 | Disposition: A | Discharge status: Home / Self Care | Payer: Medicare PPO | Source: Other Acute Inpatient Hospital | Attending: Physician Assistant | Admitting: Physician Assistant

## 2019-10-05 DIAGNOSIS — J449 Chronic obstructive pulmonary disease, unspecified: Secondary | ICD-10-CM | POA: Insufficient documentation

## 2019-10-05 DIAGNOSIS — I872 Venous insufficiency (chronic) (peripheral): Secondary | ICD-10-CM | POA: Diagnosis not present

## 2019-10-05 DIAGNOSIS — I10 Essential (primary) hypertension: Secondary | ICD-10-CM | POA: Insufficient documentation

## 2019-10-05 DIAGNOSIS — L97312 Non-pressure chronic ulcer of right ankle with fat layer exposed: Secondary | ICD-10-CM | POA: Diagnosis not present

## 2019-10-05 DIAGNOSIS — Q819 Epidermolysis bullosa, unspecified: Secondary | ICD-10-CM | POA: Diagnosis not present

## 2019-10-05 DIAGNOSIS — L97322 Non-pressure chronic ulcer of left ankle with fat layer exposed: Secondary | ICD-10-CM | POA: Insufficient documentation

## 2019-10-05 DIAGNOSIS — L97822 Non-pressure chronic ulcer of other part of left lower leg with fat layer exposed: Secondary | ICD-10-CM | POA: Diagnosis not present

## 2019-10-05 DIAGNOSIS — L97829 Non-pressure chronic ulcer of other part of left lower leg with unspecified severity: Secondary | ICD-10-CM | POA: Diagnosis present

## 2019-10-05 DIAGNOSIS — L97812 Non-pressure chronic ulcer of other part of right lower leg with fat layer exposed: Secondary | ICD-10-CM | POA: Insufficient documentation

## 2019-10-05 DIAGNOSIS — L089 Local infection of the skin and subcutaneous tissue, unspecified: Secondary | ICD-10-CM | POA: Diagnosis not present

## 2019-10-05 NOTE — Progress Notes (Addendum)
JOHNRYAN, SAO ([id number]) Visit Report for 10/05/2019 Chief Complaint Document Details Patient Name: Date of Service: [name] 10/05/2019 12:30 PM Medical Record Number: [id number] Patient Account Number: [id number] Date of Birth/Sex: Treating RN: 1940-08-11 (79 y.o. [name] Primary Care Provider: Alton Revere, [place] RY Other Clinician: Referring Provider: Treating Provider/Extender: Garret Reddish, MA RY Weeks in Treatment: 105 Information Obtained from: Patient Chief Complaint Bilateral LE Ulcers Electronic Signature(s) Signed: 10/05/2019 1:15:01 PM By: Worthy Keeler PA-C Entered By: Worthy Keeler on 10/05/2019 13:15:01 -------------------------------------------------------------------------------- Debridement Details Patient Name: Date of Service: [name] 10/05/2019 12:30 PM Medical Record Number: [id number] Patient Account Number: [id number] Date of Birth/Sex: Treating RN: 1940-12-24 (79 y.o. [name] Primary Care Provider: Alton Revere, [place] RY Other Clinician: Referring Provider: Treating Provider/Extender: Garret Reddish, [place] RY Weeks in Treatment: 105 Debridement Performed for Assessment: Wound #17 Right,Medial Malleolus Performed By: Physician Worthy Keeler, PA Debridement Type: Debridement Severity of Tissue Pre Debridement: Fat layer exposed Level of Consciousness (Pre-procedure): Awake and Alert Pre-procedure Verification/Time Out Yes - 13:20 Taken: Start Time: 13:20 Pain Control: Other : benzocaine 20% spray T Area Debrided (L x W): otal 1.6 (cm) x 2 (cm) = 3.2 (cm) Tissue and other material debrided: Viable, Non-Viable, Slough, Subcutaneous, Slough Level: Skin/Subcutaneous Tissue Debridement Description: Excisional Instrument: Curette Bleeding: None End Time: 13:24 Procedural Pain: 3 Post Procedural Pain: 1 Response to Treatment: Procedure was tolerated well Level of Consciousness (Post- Awake and  Alert procedure): Post Debridement Measurements of Total Wound Length: (cm) 1.6 Width: (cm) 2 Depth: (cm) 0.3 Volume: (cm) 0.754 Character of Wound/Ulcer Post Debridement: Requires Further Debridement Severity of Tissue Post Debridement: Fat layer exposed Post Procedure Diagnosis Same as Pre-procedure Electronic Signature(s) Signed: 10/05/2019 5:11:27 PM By: Baruch Gouty RN, BSN Signed: 10/05/2019 5:16:27 PM By: Worthy Keeler PA-C Entered By: Baruch Gouty on 10/05/2019 13:25:14 -------------------------------------------------------------------------------- HPI Details Patient Name: Date of Service: [name], GO RDO N H. 10/05/2019 12:30 PM Medical Record Number: [id number] Patient Account Number: [id number] Date of Birth/Sex: Treating RN: [birth year] (79 y.o. [name] Primary Care Provider: Alton Revere, [place] RY Other Clinician: Referring Provider: Treating Provider/Extender: Garret Reddish, MA RY Weeks in Treatment: 105 History of Present Illness HPI Description: 09/30/17 on evaluation today patient presents for initial evaluation and our clinic concerning issues that he has been having with his bilateral lower extremities. He states this has been going on for quite some time at least six months. Currently his regiment has been mainly cleaning the area with peroxide, applying the is foreign ointment, and wrapping the area with ABD pads and then an ace wrap loosely. He has dealt with issues of this nature he tells me for quite some time. He does have a history of having had a compound fracture of the left lower extremity which he thinks also makes this a much more difficult area for him to heal. He's previously been told that he had poor vascular flow but this was years ago at [facility] we do not have any of those records at this time. He has a history of Epidermolysis Bullosa which was diagnosed around age 23 and he has been cared for at [facility] since that time. Subsequently  he states this is hereditary and two of his children one male and one male also have this as well is one of his grandchildren that he is aware of. He  has no evidence of infection necessarily at this point although he does have some necrotic tissue noted on the surface of the wound as far as the largest, left lateral lower extremity ulcer, is concerned. Overall I feel like all things considered he's been taking care of this very well. Obviously he has some fairly significant issues going on at this point in this regard. He does have a history otherwise of hypertension though for the most part other than the compound fracture of the left leg he seems to have been fairly healthy in my pinion. 10/07/17 on evaluation today patient actually appears to be doing better in regard to his bilateral lower extremity ulcers. With that being said he does still have some evidence of slough noted on the surface of the wounds I think the Iodoflex has been beneficial for him. His arterial studies are scheduled for October 2. With that being said I do believe that he is continuing to show signs of good improvement which is at least good news. 10/14/17 on evaluation today patient appears to be doing very well in regard to his lower extremity ulcers. He's definitely made some progress as far as healing is concerned although there still are several open areas that are going to need to be addressed. He did have his arterial study today which fortunately shows good findings with a right ABI of 1.23 with a TBI of 0.86 in the left ABI of 1.28 with a TBI of 0.81. This is good news and will allow [place] to perform debridement as well. 10/23/2017; patient with a large wound on the left lateral calf, sizable area on the left medial malleolus and an area on the right lateral malleolus. He has a new blister consistent with his underlying blistering skin disease just above this area we have been using Iodoflex on the lateral left calf  lateral right ankle and collagen on the medial left ankle. We have been using Kerlix Coban wraps 10/28/17 on evaluation today the patient continues to have signs of improvement in regard to the overall appearance of the original wound. Unfortunately he did have some blistering over the right lateral lower extremity which has appeared to rupture on evaluation today and likely some of the dead tissue on the surface needs to be cleaned away the good news is this does not appear to be to significantly deep at this time. 11/04/17 on evaluation today patient actually appears to be doing a little better in regard to his lower extremity ulcers. He has been tolerating the dressing changes without complication. With that being said he does still have a significant wound especially over the left lateral lower extremity unfortunately. All of the wounds pretty much are going to require sharp debridement today. 11/11/17 on evaluation today patient appears to be doing more poorly in regard to his left lower extremity in particular. There does not appear to be any evidence of systemic infection although the wound itself as far as the larger left lateral lower extremity ulcer actually appears to be infected in my pinion. There's an older and the surface of the wound is dramatically worsened compared to last week. No fevers, chills, nausea, or vomiting noted at this time. 11/18/17 upon evaluation today patient actually appears to be doing better. I did review his culture today which really did not show any specific organism is a positive reason for his wound decline. There are multiple organisms present not predominant. Nonetheless he seems to be tolerate the doxycycline well in his  wounds in general do seem to be doing better. Fortunately there does not appear to be any evidence of infection at this time which is good news. Overall I'm very pleased with how things appear. Nonetheless he still has a lot of healing to go.  I do think he could benefit from a Juxta-Lite wrap. 11/25/17 on evaluation today patient actually appears to be doing fairly well in regard to his wounds. He is still taking the antibiotics he has a few days left. Fortunately this seems to have been excellent for him as far as getting the infection control and very happy in this regard. With that being said the patient likewise is also very pleased with how things appear at this time in comparison to where we were he's not having as much pain. 12/02/17 Seen today for follow p and management of LLE wounds. Wounds appear to show some improvement. He denies pain, fever, or chills. Completed a course of doxycycline earlier this month. Scheduled to received Juxta-Lite wrap this week. No s/s of infections. 12/09/17 on evaluation today patient actually appears to be doing a little bit better in regard to his wounds. This is obscene very slow process and unfortunately he has a couple of new areas and this is due to the Epidermolysis Bullosa. Nonetheless I am concerned about the fact that he seems to be getting more areas not less that is the reason we're gonna work on getting schedule for the vascular referral to see the venous specialist. 12/23/17 upon evaluation today patient's wounds currently shows evidence of still not doing quite as well is what I would like to have seen. Subsequently the patient did have his venous study which showed evidence of venous stasis. Subsequently I do think that a vascular evaluation for consideration of venous intervention would be appropriate. I'm not necessarily suggesting that will be anything that can be done but I think it is at least a good idea. He is in agreement with this plan. 12/30/17 on evaluation today patient actually appears to be doing very well in regard to his wounds when compared to previous evaluation. Subsequently we have been using the [facility] Dressing which actually appears to have done excellent  on his left lateral lower extremity ulcer. The quality of the wound surface is dramatically improved. There is some slight debridement that is going to be required at a couple of locations but overall I'm extremely pleased with how things appear here. 01/07/2018; this is a patient with a primary skin disorder epidermolyis bullosa. Is a large wound on the left lateral calf and smaller wounds on the right however there is a new wound on the right mid tibia area that occurred within the compression wrap that he did not change. We have been using Hydrofera Blue. On the left he is using Hydrofera Blue and Santyl to the inferior part of the wound and changing the dressing himself. 01/15/2018; primary skin disorder epidermolysis bullosa. He has several difficult wounds including the left lateral calf, smaller wounds on the left medial calf and the right lateral calf. The major area on the left lateral calf has a smaller area inferiorly that has necrotic debris we have been using Santyl to this. The rest of the wounds we have been using Hydrofera Blue. The area on the left calf actually looks larger this week. Uncontrolled edema several small open areas above it that are superficial 01/20/18 on evaluation today patient appears to be doing better as compared to last week in  regard to his wounds of the bilateral lower extremities. He tolerated the bilateral compression wrap without complication. Overall I'm very pleased with how things appear at this time. The patient likewise is very happy. 01/27/18 on evaluation today patient appears to be doing decently well in regard to his bilateral lower extremity ulcers. He's been tolerating the dressing changes without complication. One issue he had is that he did have more drainage to the left leg wrapped last week. He states in fact he probably should come in and let us change it on Friday however he just left it in place and kept adding extra absorption with ABD pads to  the external portion of the wrap. Unfortunately he does have some aspiration type breakdown nothing significant but I do believe that this was probably counterproductive in general. Nonetheless his wounds do not appear to be terrible overall. 02/03/18 on evaluation today patient appears to be doing rather well in regard to his lower extremity ulcers. He has been tolerating the dressing changes without complication. He does tell me that he had to change the wrap on the left one since we last saw him. Subsequently I do not see any evidence of infection I do feel like the food was much better controlled at this point. 02/10/18 on evaluation today patient appears to be doing rather well in regard to his ulcers. He still has significant alterations especially on the left lateral lower extremity. Fortunately there's no signs of infection at this time. Overall I feel like he is making good progress are some areas that I'm gonna attempt some debridement today. 02/17/18 on evaluation today patient appears to be doing okay in regard to his lower Trinity ulcer. It does appear on both locations he has a little bit of drainage causing some breakdown in maceration around the wound bed's although it doesn't appear to be too bad the right is a little bit worse than left. Fortunately there's no signs of infection which is good news. No fevers, chills, nausea, or vomiting noted at this time. 03/10/18 on evaluation today patient actually appears to be doing rather poorly in regard to his bilateral lower extremity ulcers. The right in particular is draining profusely and the wound is actually enlarging which is not good. I'm concerned about both possibly infection and the fact that there's a lot of moisture which is causing breakdown as well. Unfortunately the patient has been trying to change this at home I'm afraid he may need to change more frequently in order to see the improvement that were looking for. There's no signs  of systemic infection. 03/17/18 patient actually appears to be doing significantly better at this point in regard to his bilateral lower extremity ulcers. Fortunately there's no signs of infection. That is worsening infection at least indefinitely nothing systemic. With that being said he is having a lot of drainage though not quite as much is there in his last evaluation. Overall feel like he's on the side of improvement. I think if his results back from his culture which showed that he had a positive group B strep along with abundant Pseudomonas noted on the culture. For that reason I am gonna have him continue with the linezolid as we previously have ordered for him and I did go ahead as well today and prescribe Levaquin as well in order to treat the Pseudomonas portion of the infection noted. 03/24/18 on evaluation today patient actually appears to be doing very well in regard to his lower Trinity ulcer.  He's been tolerating the dressing changes without complication. Fortunately both legs show signs of less drainage in his edema is very well controlled at this point as well. Overall very pleased with how things seem to be progressing. 03/31/18 on evaluation today patient actually appears to be doing excellent in regard to his bilateral lower extremity ulcers. These are not draining nearly as significant as what they were in the past overall seem to be shown signs of excellent improvement which is great news. Fortunately there is no sign of active infection at this time I do believe that the Levaquin has done extremely well for him in this regard. The patient continues to change these at home typically every day. We may be able to slowly work towards every other day changes since the drainage seems to be slowing down quite significantly. 04/14/18 on evaluation today patient appears to be doing well in regard to his bilateral lower extremities. Let me Hilda Blades Almost completely healed which is excellent  news. Fortunately he's shown signs of improvement all other sites as well with new skin growth there's some slight hyper granular tissue but for the most part this seems to be well maintained with the [facility] Dressing. I'm very happy in this regard. 04/28/18 on evaluation today patient appears to be doing rather well in regard to his ulcers of the bilateral lower extremities all things considering. He continues to make some progress as far as new skin growth. There still some hyper granulation noted at this point despite the use of the [facility] Dressing. This is not terrible but I think we may want to consider conclude cauterization today with silver nitrate to try to help knock some of his back as well as helping with any biofilm on the surface of the wound. 05/12/18 on evaluation today patient's wounds actually appear to be doing fairly well in regard to the bilateral lower extremities. He's been tolerating the dressing changes without complication. Fortunately there's no signs of active infection at this time which is good news. Overall very pleased with how things seem to be progressing. You select silver nitrate was beneficial for him. 05/26/18 on evaluation today patient appears to be doing better in regard to left lower extremity and a little bit worse in regard to the right lower extremity. He states that he was pulling off the [facility] Dressing peel back some of the skin making this area significantly larger than what it was previous. He's not had any issues other than this and states even that hasn't caused any pain he just seems to obviously have a much larger area on the right when compared to the previous time I saw him. No fevers, chills, nausea, or vomiting noted at this time. 06/16/18 on evaluation today patient actually appears to be doing a little better in my pinion in regard to his lower summary ulcers. He has new skin islands that seem to be spreading which is good  news. Fortunately there's no signs of active infection at this time. His biggest issue is he tells me that coming as often as he does is becoming very cost prohibitive. He wonders if we can potentially spread this out. 07/14/18 on evaluation today patient appears to be doing a little bit worse in regard to his lower from the ulcer. Unfortunately he still continues to have a significant amount of drainage I think we need to do something to try to help this more. He is still somewhat reluctant to go the route  of the Wound VAC although that may be the most appropriate thing for him. No fevers, chills, nausea, or vomiting noted at this time. 08/18/2018 on evaluation today patient actually appears to be doing quite well with regard to his bilateral lower extremity ulcers. I do feel like that currently he is making great progress the care max does seem to be doing a great job at helping to control the moisture he has no maceration or skin breakdown. Again this seems to be an excellent way to go. 1 thing we may want to change is adding collagen to the base of the wound and then the care max over top he is not opposed to this. 09/15/2018 on evaluation today patient appears to be doing well with regard to his bilateral lower extremity ulcers. He is showing some signs of improvement not necessarily in size but definitely in appearance. In fact he has a lot of new skin growing throughout the wounds along the edges as well as in the central portion of the wounds on both lower extremities. Overall I am extremely pleased to see this. 10/20/2018 on evaluation today patient actually appears to be doing quite well with regard to his wounds. They are not measuring significantly smaller but he does have a lot of new epithelization noted as compared to previous. Fortunately there is no signs of active infection at this time. No fevers, chills, nausea, vomiting, or diarrhea. 11/17/2018 on evaluation today patient presents for  reevaluation concerning his bilateral lower extremity ulcers. Fortunately there is no signs of active infection at this time today. He has been tolerating the dressing changes without complication. No fevers, chills, nausea, vomiting, or diarrhea. Unfortunately in general the patient has not made as much improvement as I would like to have seen up to this point. He has been tolerating the dressing changes without complication and he does an excellent job taking care of his wounds at home in my opinion. The biggest issue I see is that he is just not making the progress that we need to be seeing currently. I think we may want to consider having him seen at a plastic surgery appointment and he has previously seen someone in years past at [facility] in [place]. That is definitely a possibility for [place] to look into at this point. 12/29/2018 on evaluation today patient appears to be doing better in regard to the overall visual appearance of his wounds which do not appear to be as macerated. He does have a much larger skin island in the middle of the left lower extremity ulcer which is doing much better. He tells me the pain is also significantly better. With that being said overall his improvement as far as the size of the wounds is not better but again these are very irregular in change shape quite often. Fortunately there is no evidence of active infection at this time which is great news. He never heard anything from [facility] regarding the plastic surgery referral that we made to them. 01/26/2019 upon evaluation today patient appears to be doing a little better in regard to his wounds today. He has been tolerating the dressing changes again he performs these for the most part on his own. He does a great job wrapping his legs in my opinion. Unfortunately he has not been able to get down to [facility] to see if there is anything from a plastic surgery standpoint that could be done to help  with his legs simply due to  the fact that his wife unfortunately sustained a compression fracture in her spine she is seeing Dr. Saintclair Halsted and subsequently is going to be having what sounds to be a kyphoplasty type procedure. With that being said that has not been scheduled yet there is still waiting on an MRI the patient is very busy in fact overly busy trying to help take care of his wife at this point. I completely understand this is more of a strain on him at this time 02/23/2019 upon evaluation today patient actually appears to be making some progress. I am actually very pleased with the overall appearance of his wounds even compared to last evaluation. He seems to be doing quite well. He is taking care of his wife unfortunately she did have a compression fracture she has had the procedure for this but still she has a slow road to recovery. For that reason he still not gone to [facility] for a second opinion in this regard. Obviously the goal there was if there was anything that can be done from a skin graft standpoint or otherwise. 03/23/2019 upon evaluation today patient continues to have issues with lower extremity ulcers. Since the beginning he has made progress but at the same time the wounds unfortunately just will not close. We have been trying to get the patient to [facility] to see a specialist there but unfortunately with the everything going on with his wife he has not been able to make that appointment time yet he states he may be able to in the next 1-2 months but is not really sure. 04/27/2019 on evaluation today patient appears to be doing a little bit more poorly. His last evaluation. He appears to have some erythema around the edges of the wound at this point. Fortunately there is no signs of active infection at this time which is good news. No fevers, chills, nausea, vomiting, or diarrhea. 06/08/2019 on evaluation today patient appears to be doing well with regard to  his wounds. Overall they are actually measuring smaller compared to the last visit last month. We did treat him for Pseudomonas as well as methicillin-resistant Staph aureus. He was only on the treatment for MRSA however for 7 days as the Cipro was resistant and subsequently we had to place him on doxycycline. Nonetheless I am thinking that we may want to add the doxycycline and just do a month-long treatment considering the longstanding nature of his wounds and see if we get this under better control. 07/13/2019 upon evaluation today patient appears to be doing fairly well in regard to his bilateral lower extremities. There does not appear to be any signs of active infection which is good news. No fevers, chills, nausea, vomiting, or diarrhea. 08/10/2019 upon evaluation today patient appears to be doing about the same with regard to his wounds in general. Unfortunately he is not significantly better although is also not significantly worse which is great news there is no evidence of active infection at this time which is good news. He still dealing with a lot going on with his wife and therefore is not really able to go see anyone at the specialty clinic at [facility] that we have previously set up still. 09/07/2019 on evaluation today patient appears to be doing well with regard to his wounds. In fact this is probably the best that have seen so far in quite a few months. Overall I am very pleased with where things stand at this time. No fevers, chills,  nausea, vomiting, or diarrhea. 10/05/2019 upon evaluation today patient appears to be doing more poorly in regard to his legs at this point. He actually is showing some signs of infection. This has been something that we seem to be back-and-forth with as far as trying to keep these wounds from becoming infected. He takes care of them very well in my opinion but nonetheless I am concerned in this regard. He tells me that his wife is still doing really about the  same she is slowly getting better. Nonetheless he still spends the majority of his time helping to take care of her. Electronic Signature(s) Signed: 10/05/2019 1:27:31 PM By: Worthy Keeler PA-C Entered By: Worthy Keeler on 10/05/2019 13:27:31 -------------------------------------------------------------------------------- Physical Exam Details Patient Name: Date of Service: [name] 10/05/2019 12:30 PM Medical Record Number: [id number] Patient Account Number: [id number] Date of Birth/Sex: Treating RN: Sep 27, 1940 (79 y.o. [name] Primary Care Provider: Alton Revere, [place] RY Other Clinician: Referring Provider: Treating Provider/Extender: Garret Reddish, [place] RY Weeks in Treatment: 103 Constitutional Well-nourished and well-hydrated in no acute distress. Respiratory normal breathing without difficulty. Psychiatric this patient is able to make decisions and demonstrates good insight into disease process. Alert and Oriented x 3. pleasant and cooperative. Notes Upon inspection patient's wound bed actually showed signs of good granulation at this time. There does appeared to be evidence of infection unfortunately. The patient also has a new wound on the right medial ankle which had previously healed and been doing great I believe this may be a result of infection as well. There is no evidence of systemic infection either. Electronic Signature(s) Signed: 10/05/2019 1:28:22 PM By: Worthy Keeler PA-C Entered By: Worthy Keeler on 10/05/2019 13:28:22 -------------------------------------------------------------------------------- Physician Orders Details Patient Name: Date of Service: [name] 10/05/2019 12:30 PM Medical Record Number: [id number] Patient Account Number: [id number] Date of Birth/Sex: Treating RN: January 05, 1941 (79 y.o. 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 Dressing Change Frequency Wound #17 Right,Medial Malleolus Change Dressing every other day. Wound #2 Left,Distal,Lateral Lower Leg Change Dressing every other day. Wound #5 Right,Proximal,Lateral Lower Leg Change Dressing every other day. Skin Barriers/Peri-Wound Care Barrier cream - to periwound areas Moisturizing lotion - both legs with dressing changes Wound Cleansing Wound #2 Left,Distal,Lateral Lower Leg May shower and wash wound with soap and water. - with dressing changes Wound #5 Right,Proximal,Lateral Lower Leg May shower and wash wound with soap and water. - with dressing changes Primary Wound Dressing Wound #17 Right,Medial Malleolus Collagen - fibracol - do not need to moisten Wound #2 Left,Distal,Lateral Lower Leg Collagen - fibracol - do not need to moisten Wound #5 Right,Proximal,Lateral Lower Leg Collagen - fibracol - do not need to moisten Secondary Dressing Wound #17 Right,Medial Malleolus Dry Gauze Wound #2 Left,Distal,Lateral Lower Leg Kerlix/Rolled Gauze ABD pad - all wounds Zetuvit or Kerramax Wound #5 Right,Proximal,Lateral Lower Leg Kerlix/Rolled Gauze ABD pad -  all wounds Zetuvit or Kerramax Edema Control Avoid standing for long periods of time Elevate legs to the level of the heart or above for 30 minutes daily and/or when sitting, a frequency of: - throughout the day Support Garment 20-30  mm/Hg pressure to: - patient to apply juxtalites daily to both legs over ace wraps daily Other: - ACE wrap both legs Laboratory naerobe culture (MICRO) - left lower leg Bacteria identified in Unspecified specimen by A LOINC Code: 616-0 Convenience Name: Anerobic culture Patient Medications llergies: No Known Drug Allergies A Notifications Medication Indication Start End 10/05/2019 doxycycline hyclate DOSE 1 - oral 100 mg capsule - 1 capsule oral taken 2 times per day for 14 days. Do not take a multivitamin with this medication Electronic Signature(s) Signed: 10/05/2019 1:31:10 PM By: Worthy Keeler PA-C Entered By: Worthy Keeler on 10/05/2019 13:31:08 -------------------------------------------------------------------------------- Problem List Details Patient Name: Date of Service: [name] 10/05/2019 12:30 PM Medical Record Number: [id number] Patient Account Number: [id number] Date of Birth/Sex: Treating RN: 1940-10-03 (79 y.o. 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 09/30/2017 No Yes L97.322 Non-pressure chronic ulcer of left ankle with fat layer exposed 09/30/2017 No Yes L97.822 Non-pressure chronic ulcer of other part of left lower leg with fat layer exposed9/18/2019 No Yes L97.812 Non-pressure chronic ulcer of other part of right lower leg with fat layer 09/30/2017 No Yes exposed I10 Essential (primary) hypertension 09/30/2017 No Yes Inactive Problems Resolved Problems Electronic Signature(s) Signed: 10/05/2019 1:14:46 PM By: Worthy Keeler PA-C Entered By: Worthy Keeler on 10/05/2019  13:14:45 -------------------------------------------------------------------------------- Progress Note Details Patient Name: Date of Service: [name] 10/05/2019 12:30 PM Medical Record Number: [id number] Patient Account Number: [id number] Date of Birth/Sex: Treating RN: January 05, 1941 (79 y.o. [name] Primary Care Provider: Alton Revere, [place] RY Other Clinician: Referring Provider: Treating Provider/Extender: Garret Reddish, [place] RY Weeks in Treatment: 105 Subjective Chief Complaint Information obtained from Patient Bilateral LE Ulcers History of Present Illness (HPI) 09/30/17 on evaluation today patient presents for initial evaluation and our clinic concerning issues that he has been having with his bilateral lower extremities. He states this has been going on for quite some time at least six months. Currently his regiment has been mainly cleaning the area with peroxide, applying the is foreign ointment, and wrapping the area with ABD pads and then an ace wrap loosely. He has dealt with issues of this nature he tells me for quite some time. He does have a history of having had a compound fracture of the left lower extremity which he thinks also makes this a much more difficult area for him to heal. He's previously been told that he had poor vascular flow but this was years ago at [facility] we do not have any of those records at this time. He has a history of Epidermolysis Bullosa which was diagnosed around age 62 and he has been cared for at [facility] since that time. Subsequently he states this is hereditary and two of his children one male and one male also have this as well is one of his grandchildren that he is aware of. He has no evidence of infection necessarily at this point although he does  have some necrotic tissue noted on the surface of the wound as far as the largest, left lateral lower extremity ulcer, is concerned. Overall I feel like all things considered he's been  taking care of this very well. Obviously he has some fairly significant issues going on at this point in this regard. He does have a history otherwise of hypertension though for the most part other than the compound fracture of the left leg he seems to have been fairly healthy in my pinion. 10/07/17 on evaluation today patient actually appears to be doing better in regard to his bilateral lower extremity ulcers. With that being said he does still have some evidence of slough noted on the surface of the wounds I think the Iodoflex has been beneficial for him. His arterial studies are scheduled for October 2. With that being said I do believe that he is continuing to show signs of good improvement which is at least good news. 10/14/17 on evaluation today patient appears to be doing very well in regard to his lower extremity ulcers. He's definitely made some progress as far as healing is concerned although there still are several open areas that are going to need to be addressed. He did have his arterial study today which fortunately shows good findings with a right ABI of 1.23 with a TBI of 0.86 in the left ABI of 1.28 with a TBI of 0.81. This is good news and will allow [place] to perform debridement as well. 10/23/2017; patient with a large wound on the left lateral calf, sizable area on the left medial malleolus and an area on the right lateral malleolus. He has a new blister consistent with his underlying blistering skin disease just above this area we have been using Iodoflex on the lateral left calf lateral right ankle and collagen on the medial left ankle. We have been using Kerlix Coban wraps 10/28/17 on evaluation today the patient continues to have signs of improvement in regard to the overall appearance of the original wound. Unfortunately he did have some blistering over the right lateral lower extremity which has appeared to rupture on evaluation today and likely some of the dead tissue on  the surface needs to be cleaned away the good news is this does not appear to be to significantly deep at this time. 11/04/17 on evaluation today patient actually appears to be doing a little better in regard to his lower extremity ulcers. He has been tolerating the dressing changes without complication. With that being said he does still have a significant wound especially over the left lateral lower extremity unfortunately. All of the wounds pretty much are going to require sharp debridement today. 11/11/17 on evaluation today patient appears to be doing more poorly in regard to his left lower extremity in particular. There does not appear to be any evidence of systemic infection although the wound itself as far as the larger left lateral lower extremity ulcer actually appears to be infected in my pinion. There's an older and the surface of the wound is dramatically worsened compared to last week. No fevers, chills, nausea, or vomiting noted at this time. 11/18/17 upon evaluation today patient actually appears to be doing better. I did review his culture today which really did not show any specific organism is a positive reason for his wound decline. There are multiple organisms present not predominant. Nonetheless he seems to be tolerate the doxycycline well in his wounds in general do seem to be doing better. Fortunately there  does not appear to be any evidence of infection at this time which is good news. Overall I'm very pleased with how things appear. Nonetheless he still has a lot of healing to go. I do think he could benefit from a Juxta-Lite wrap. 11/25/17 on evaluation today patient actually appears to be doing fairly well in regard to his wounds. He is still taking the antibiotics he has a few days left. Fortunately this seems to have been excellent for him as far as getting the infection control and very happy in this regard. With that being said the patient likewise is also very pleased  with how things appear at this time in comparison to where we were he's not having as much pain. 12/02/17 Seen today for follow p and management of LLE wounds. Wounds appear to show some improvement. He denies pain, fever, or chills. Completed a course of doxycycline earlier this month. Scheduled to received Juxta-Lite wrap this week. No s/s of infections. 12/09/17 on evaluation today patient actually appears to be doing a little bit better in regard to his wounds. This is obscene very slow process and unfortunately he has a couple of new areas and this is due to the Epidermolysis Bullosa. Nonetheless I am concerned about the fact that he seems to be getting more areas not less that is the reason we're gonna work on getting schedule for the vascular referral to see the venous specialist. 12/23/17 upon evaluation today patient's wounds currently shows evidence of still not doing quite as well is what I would like to have seen. Subsequently the patient did have his venous study which showed evidence of venous stasis. Subsequently I do think that a vascular evaluation for consideration of venous intervention would be appropriate. I'm not necessarily suggesting that will be anything that can be done but I think it is at least a good idea. He is in agreement with this plan. 12/30/17 on evaluation today patient actually appears to be doing very well in regard to his wounds when compared to previous evaluation. Subsequently we have been using the [facility] Dressing which actually appears to have done excellent on his left lateral lower extremity ulcer. The quality of the wound surface is dramatically improved. There is some slight debridement that is going to be required at a couple of locations but overall I'm extremely pleased with how things appear here. 01/07/2018; this is a patient with a primary skin disorder epidermolyis bullosa. Is a large wound on the left lateral calf and smaller wounds on the  right however there is a new wound on the right mid tibia area that occurred within the compression wrap that he did not change. We have been using Hydrofera Blue. On the left he is using Hydrofera Blue and Santyl to the inferior part of the wound and changing the dressing himself. 01/15/2018; primary skin disorder epidermolysis bullosa. He has several difficult wounds including the left lateral calf, smaller wounds on the left medial calf and the right lateral calf. The major area on the left lateral calf has a smaller area inferiorly that has necrotic debris we have been using Santyl to this. The rest of the wounds we have been using Hydrofera Blue. The area on the left calf actually looks larger this week. Uncontrolled edema several small open areas above it that are superficial 01/20/18 on evaluation today patient appears to be doing better as compared to last week in regard to his wounds of the bilateral lower extremities. He tolerated  the bilateral compression wrap without complication. Overall I'm very pleased with how things appear at this time. The patient likewise is very happy. 01/27/18 on evaluation today patient appears to be doing decently well in regard to his bilateral lower extremity ulcers. He's been tolerating the dressing changes without complication. One issue he had is that he did have more drainage to the left leg wrapped last week. He states in fact he probably should come in and let us change it on Friday however he just left it in place and kept adding extra absorption with ABD pads to the external portion of the wrap. Unfortunately he does have some aspiration type breakdown nothing significant but I do believe that this was probably counterproductive in general. Nonetheless his wounds do not appear to be terrible overall. 02/03/18 on evaluation today patient appears to be doing rather well in regard to his lower extremity ulcers. He has been tolerating the dressing changes  without complication. He does tell me that he had to change the wrap on the left one since we last saw him. Subsequently I do not see any evidence of infection I do feel like the food was much better controlled at this point. 02/10/18 on evaluation today patient appears to be doing rather well in regard to his ulcers. He still has significant alterations especially on the left lateral lower extremity. Fortunately there's no signs of infection at this time. Overall I feel like he is making good progress are some areas that I'm gonna attempt some debridement today. 02/17/18 on evaluation today patient appears to be doing okay in regard to his lower Trinity ulcer. It does appear on both locations he has a little bit of drainage causing some breakdown in maceration around the wound bed's although it doesn't appear to be too bad the right is a little bit worse than left. Fortunately there's no signs of infection which is good news. No fevers, chills, nausea, or vomiting noted at this time. 03/10/18 on evaluation today patient actually appears to be doing rather poorly in regard to his bilateral lower extremity ulcers. The right in particular is draining profusely and the wound is actually enlarging which is not good. I'm concerned about both possibly infection and the fact that there's a lot of moisture which is causing breakdown as well. Unfortunately the patient has been trying to change this at home I'm afraid he may need to change more frequently in order to see the improvement that were looking for. There's no signs of systemic infection. 03/17/18 patient actually appears to be doing significantly better at this point in regard to his bilateral lower extremity ulcers. Fortunately there's no signs of infection. That is worsening infection at least indefinitely nothing systemic. With that being said he is having a lot of drainage though not quite as much is there in his last evaluation. Overall feel like he's  on the side of improvement. I think if his results back from his culture which showed that he had a positive group B strep along with abundant Pseudomonas noted on the culture. For that reason I am gonna have him continue with the linezolid as we previously have ordered for him and I did go ahead as well today and prescribe Levaquin as well in order to treat the Pseudomonas portion of the infection noted. 03/24/18 on evaluation today patient actually appears to be doing very well in regard to his lower Trinity ulcer. He's been tolerating the dressing changes without complication. Fortunately both legs  show signs of less drainage in his edema is very well controlled at this point as well. Overall very pleased with how things seem to be progressing. 03/31/18 on evaluation today patient actually appears to be doing excellent in regard to his bilateral lower extremity ulcers. These are not draining nearly as significant as what they were in the past overall seem to be shown signs of excellent improvement which is great news. Fortunately there is no sign of active infection at this time I do believe that the Levaquin has done extremely well for him in this regard. The patient continues to change these at home typically every day. We may be able to slowly work towards every other day changes since the drainage seems to be slowing down quite significantly. 04/14/18 on evaluation today patient appears to be doing well in regard to his bilateral lower extremities. Let me [facility] Almost completely healed which is excellent news. Fortunately he's shown signs of improvement all other sites as well with new skin growth there's some slight hyper granular tissue but for the most part this seems to be well maintained with the [facility] Dressing. I'm very happy in this regard. 04/28/18 on evaluation today patient appears to be doing rather well in regard to his ulcers of the bilateral lower extremities all things  considering. He continues to make some progress as far as new skin growth. There still some hyper granulation noted at this point despite the use of the [facility] Dressing. This is not terrible but I think we may want to consider conclude cauterization today with silver nitrate to try to help knock some of his back as well as helping with any biofilm on the surface of the wound. 05/12/18 on evaluation today patient's wounds actually appear to be doing fairly well in regard to the bilateral lower extremities. He's been tolerating the dressing changes without complication. Fortunately there's no signs of active infection at this time which is good news. Overall very pleased with how things seem to be progressing. You select silver nitrate was beneficial for him. 05/26/18 on evaluation today patient appears to be doing better in regard to left lower extremity and a little bit worse in regard to the right lower extremity. He states that he was pulling off the [facility] Dressing peel back some of the skin making this area significantly larger than what it was previous. He's not had any issues other than this and states even that hasn't caused any pain he just seems to obviously have a much larger area on the right when compared to the previous time I saw him. No fevers, chills, nausea, or vomiting noted at this time. 06/16/18 on evaluation today patient actually appears to be doing a little better in my pinion in regard to his lower summary ulcers. He has new skin islands that seem to be spreading which is good news. Fortunately there's no signs of active infection at this time. His biggest issue is he tells me that coming as often as he does is becoming very cost prohibitive. He wonders if we can potentially spread this out. 07/14/18 on evaluation today patient appears to be doing a little bit worse in regard to his lower from the ulcer. Unfortunately he still continues to have a significant amount of  drainage I think we need to do something to try to help this more. He is still somewhat reluctant to go the route of the Wound VAC although that may be the most appropriate  thing for him. No fevers, chills, nausea, or vomiting noted at this time. 08/18/2018 on evaluation today patient actually appears to be doing quite well with regard to his bilateral lower extremity ulcers. I do feel like that currently he is making great progress the care max does seem to be doing a great job at helping to control the moisture he has no maceration or skin breakdown. Again this seems to be an excellent way to go. 1 thing we may want to change is adding collagen to the base of the wound and then the care max over top he is not opposed to this. 09/15/2018 on evaluation today patient appears to be doing well with regard to his bilateral lower extremity ulcers. He is showing some signs of improvement not necessarily in size but definitely in appearance. In fact he has a lot of new skin growing throughout the wounds along the edges as well as in the central portion of the wounds on both lower extremities. Overall I am extremely pleased to see this. 10/20/2018 on evaluation today patient actually appears to be doing quite well with regard to his wounds. They are not measuring significantly smaller but he does have a lot of new epithelization noted as compared to previous. Fortunately there is no signs of active infection at this time. No fevers, chills, nausea, vomiting, or diarrhea. 11/17/2018 on evaluation today patient presents for reevaluation concerning his bilateral lower extremity ulcers. Fortunately there is no signs of active infection at this time today. He has been tolerating the dressing changes without complication. No fevers, chills, nausea, vomiting, or diarrhea. Unfortunately in general the patient has not made as much improvement as I would like to have seen up to this point. He has been tolerating the dressing  changes without complication and he does an excellent job taking care of his wounds at home in my opinion. The biggest issue I see is that he is just not making the progress that we need to be seeing currently. I think we may want to consider having him seen at a plastic surgery appointment and he has previously seen someone in years past at [facility] in [place]. That is definitely a possibility for [place] to look into at this point. 12/29/2018 on evaluation today patient appears to be doing better in regard to the overall visual appearance of his wounds which do not appear to be as macerated. He does have a much larger skin island in the middle of the left lower extremity ulcer which is doing much better. He tells me the pain is also significantly better. With that being said overall his improvement as far as the size of the wounds is not better but again these are very irregular in change shape quite often. Fortunately there is no evidence of active infection at this time which is great news. He never heard anything from [facility] regarding the plastic surgery referral that we made to them. 01/26/2019 upon evaluation today patient appears to be doing a little better in regard to his wounds today. He has been tolerating the dressing changes again he performs these for the most part on his own. He does a great job wrapping his legs in my opinion. Unfortunately he has not been able to get down to [facility] to see if there is anything from a plastic surgery standpoint that could be done to help with his legs simply due to the fact that his wife unfortunately sustained a compression fracture in her  spine she is seeing Dr. Saintclair Halsted and subsequently is going to be having what sounds to be a kyphoplasty type procedure. With that being said that has not been scheduled yet there is still waiting on an MRI the patient is very busy in fact overly busy trying to help take care of his wife at this point.  I completely understand this is more of a strain on him at this time 02/23/2019 upon evaluation today patient actually appears to be making some progress. I am actually very pleased with the overall appearance of his wounds even compared to last evaluation. He seems to be doing quite well. He is taking care of his wife unfortunately she did have a compression fracture she has had the procedure for this but still she has a slow road to recovery. For that reason he still not gone to [facility] for a second opinion in this regard. Obviously the goal there was if there was anything that can be done from a skin graft standpoint or otherwise. 03/23/2019 upon evaluation today patient continues to have issues with lower extremity ulcers. Since the beginning he has made progress but at the same time the wounds unfortunately just will not close. We have been trying to get the patient to [facility] to see a specialist there but unfortunately with the everything going on with his wife he has not been able to make that appointment time yet he states he may be able to in the next 1-2 months but is not really sure. 04/27/2019 on evaluation today patient appears to be doing a little bit more poorly. His last evaluation. He appears to have some erythema around the edges of the wound at this point. Fortunately there is no signs of active infection at this time which is good news. No fevers, chills, nausea, vomiting, or diarrhea. 06/08/2019 on evaluation today patient appears to be doing well with regard to his wounds. Overall they are actually measuring smaller compared to the last visit last month. We did treat him for Pseudomonas as well as methicillin-resistant Staph aureus. He was only on the treatment for MRSA however for 7 days as the Cipro was resistant and subsequently we had to place him on doxycycline. Nonetheless I am thinking that we may want to add the doxycycline and just do a month-long  treatment considering the longstanding nature of his wounds and see if we get this under better control. 07/13/2019 upon evaluation today patient appears to be doing fairly well in regard to his bilateral lower extremities. There does not appear to be any signs of active infection which is good news. No fevers, chills, nausea, vomiting, or diarrhea. 08/10/2019 upon evaluation today patient appears to be doing about the same with regard to his wounds in general. Unfortunately he is not significantly better although is also not significantly worse which is great news there is no evidence of active infection at this time which is good news. He still dealing with a lot going on with his wife and therefore is not really able to go see anyone at the specialty clinic at [facility] that we have previously set up still. 09/07/2019 on evaluation today patient appears to be doing well with regard to his wounds. In fact this is probably the best that have seen so far in quite a few months. Overall I am very pleased with where things stand at this time. No fevers, chills, nausea, vomiting, or diarrhea. 10/05/2019 upon evaluation today patient appears to  be doing more poorly in regard to his legs at this point. He actually is showing some signs of infection. This has been something that we seem to be back-and-forth with as far as trying to keep these wounds from becoming infected. He takes care of them very well in my opinion but nonetheless I am concerned in this regard. He tells me that his wife is still doing really about the same she is slowly getting better. Nonetheless he still spends the majority of his time helping to take care of her. Objective Constitutional Well-nourished and well-hydrated in no acute distress. Vitals Time Taken: 12:49 PM, Height: 71 in, Weight: 220 lbs, BMI: 30.7, Temperature: 98.2 F, Pulse: 79 bpm, Respiratory Rate: 16 breaths/min, Blood Pressure: 136/73 mmHg. Respiratory normal  breathing without difficulty. Psychiatric this patient is able to make decisions and demonstrates good insight into disease process. Alert and Oriented x 3. pleasant and cooperative. General Notes: Upon inspection patient's wound bed actually showed signs of good granulation at this time. There does appeared to be evidence of infection unfortunately. The patient also has a new wound on the right medial ankle which had previously healed and been doing great I believe this may be a result of infection as well. There is no evidence of systemic infection either. Integumentary (Hair, Skin) Wound #17 status is Open. Original cause of wound was Gradually Appeared. The wound is located on the Right,Medial Malleolus. The wound measures 1.6cm length x 2cm width x 0.3cm depth; 2.513cm^2 area and 0.754cm^3 volume. There is Fat Layer (Subcutaneous Tissue) exposed. There is no tunneling or undermining noted. There is a medium amount of serosanguineous drainage noted. The wound margin is flat and intact. There is small (1-33%) pink granulation within the wound bed. There is a large (67-100%) amount of necrotic tissue within the wound bed including Adherent Slough. Wound #2 status is Open. Original cause of wound was Gradually Appeared. The wound is located on the Left,Distal,Lateral Lower Leg. The wound measures 17cm length x 16cm width x 0.1cm depth; 213.628cm^2 area and 21.363cm^3 volume. There is Fat Layer (Subcutaneous Tissue) exposed. There is no tunneling or undermining noted. There is a large amount of serosanguineous drainage noted. The wound margin is flat and intact. There is large (67-100%) red granulation within the wound bed. There is a small (1-33%) amount of necrotic tissue within the wound bed including Adherent Slough. Wound #5 status is Open. Original cause of wound was Gradually Appeared. The wound is located on the Right,Proximal,Lateral Lower Leg. The wound measures 10.5cm length x 24cm width x  0.1cm depth; 197.92cm^2 area and 19.792cm^3 volume. There is Fat Layer (Subcutaneous Tissue) exposed. There is no tunneling or undermining noted. There is a large amount of serosanguineous drainage noted. The wound margin is flat and intact. There is large (67-100%) red granulation within the wound bed. There is no necrotic tissue within the wound bed. 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 There was a Excisional Skin/Subcutaneous Tissue Debridement with a total area of 3.2 sq cm performed by Worthy Keeler, PA. With the following  instrument(s): Curette to remove Viable and Non-Viable tissue/material. Material removed includes Subcutaneous Tissue and Slough and after achieving pain control using Other (benzocaine 20% spray). No specimens were taken. A time out was conducted at 13:20, prior to the start of the procedure. There was no bleeding. The procedure was tolerated well with a pain level of 3 throughout and a pain level of 1 following the procedure. Post Debridement Measurements: 1.6cm length x 2cm width x 0.3cm depth; 0.754cm^3 volume. Character of Wound/Ulcer Post Debridement requires further debridement. Severity of Tissue Post Debridement is: Fat layer exposed. Post procedure Diagnosis Wound #17: Same as Pre-Procedure Plan Follow-up Appointments: Return appointment in 1 month. Dressing Change Frequency: Wound #17 Right,Medial Malleolus: Change Dressing every other day. Wound #2  Left,Distal,Lateral Lower Leg: Change Dressing every other day. Wound #5 Right,Proximal,Lateral Lower Leg: Change Dressing every other day. Skin Barriers/Peri-Wound Care: Barrier cream - to periwound areas Moisturizing lotion - both legs with dressing changes Wound Cleansing: Wound #2 Left,Distal,Lateral Lower Leg: May shower and wash wound with soap and water. - with dressing changes Wound #5 Right,Proximal,Lateral Lower Leg: May shower and wash wound with soap and water. - with dressing changes Primary Wound Dressing: Wound #17 Right,Medial Malleolus: Collagen - fibracol - do not need to moisten Wound #2 Left,Distal,Lateral Lower Leg: Collagen - fibracol - do not need to moisten Wound #5 Right,Proximal,Lateral Lower Leg: Collagen - fibracol - do not need to moisten Secondary Dressing: Wound #17 Right,Medial Malleolus: Dry Gauze Wound #2 Left,Distal,Lateral Lower Leg: Kerlix/Rolled Gauze ABD pad - all wounds Zetuvit or Kerramax Wound #5 Right,Proximal,Lateral Lower Leg: Kerlix/Rolled Gauze ABD pad - all wounds Zetuvit or Kerramax Edema Control: Avoid standing for long periods of time Elevate legs to the level of the heart or above for 30 minutes daily and/or when sitting, a frequency of: - throughout the day Support Garment 20-30 mm/Hg pressure to: - patient to apply juxtalites daily to both legs over ace wraps daily Other: - ACE wrap both legs Laboratory ordered were: Anerobic culture - left lower leg The following medication(s) was prescribed: doxycycline hyclate oral 100 mg capsule 1 1 capsule oral taken 2 times per day for 14 days. Do not take a multivitamin with this medication starting 10/05/2019 1. I would recommend at this time that we go ahead and initiate treatment with a continuation of the collagen dressing which has done decently well. I think the issue right now is that he has an infection which is completely separate from what we have been doing currently. 2. I  am also can recommend at this time that the patient continue with elevating his legs much as possible as well as performing the wraps at home as he has been doing I think that does excellent for him. 3. I am also going to suggest at this time that we perform a wound culture depend on the results of the culture I am also going to recommend actually that we go ahead and proceed with doxycycline for the time being. I am also can I suggest that we check with [facility] pharmacy to see if we can get a topical compound spray in order to try to keep this chronic infection under control I think that is one of the reasons he had such a hard time healing coupled with his epidermal lows as below 7. We will see patient back for reevaluation in 1 month here in the clinic. If anything worsens or changes patient will contact our office for additional recommendations. Electronic  Signature(s) Signed: 10/05/2019 1:32:31 PM By: Worthy Keeler PA-C Entered By: Worthy Keeler on 10/05/2019 13:32:30 -------------------------------------------------------------------------------- SuperBill Details Patient Name: Date of Service: [name] 10/05/2019 Medical Record Number: [id number] Patient Account Number: [id number] Date of Birth/Sex: Treating RN: 1940-08-30 (79 y.o. [name] Primary Care Provider: Alton Revere, [place] RY Other Clinician: Referring Provider: Treating Provider/Extender: Garret Reddish, MA RY Weeks in Treatment: 105 Diagnosis Coding ICD-10 Codes Code Description I87.2 Venous insufficiency (chronic) (peripheral) Q81.8 Other epidermolysis bullosa L97.322 Non-pressure chronic ulcer of left ankle with fat layer exposed L97.822 Non-pressure chronic ulcer of other part of left lower leg with fat layer exposed L97.812 Non-pressure chronic ulcer of other part of right lower leg with fat layer exposed I10 Essential (primary) hypertension Facility Procedures Physician Procedures : CPT4  Code Description Modifier [id number] - WC PHYS LEVEL 4 - EST PT 25 ICD-10 Diagnosis Description I87.2 Venous insufficiency (chronic) (peripheral) Q81.8 Other epidermolysis bullosa L97.322 Non-pressure chronic ulcer of left ankle with fat layer  exposed L97.822 Non-pressure chronic ulcer of other part of left lower leg with fat layer exposed Quantity: 1 : [id number] - WC PHYS SUBQ TISS 20 SQ CM ICD-10 Diagnosis Description H96.222 Non-pressure chronic ulcer of other part of right lower leg with fat layer exposed Quantity: 1 Electronic Signature(s) Signed: 10/05/2019 1:32:47 PM By: Worthy Keeler PA-C Entered By: Worthy Keeler on 10/05/2019 13:32:46

## 2019-10-07 NOTE — Progress Notes (Signed)
FORTUNE, BRANNIGAN ([id number]) Visit Report for 10/05/2019 Arrival Information Details Patient Name: Date of Service: Carlos White 10/05/2019 12:30 PM Medical Record Number: [id number] Patient Account Number: [id number] Date of Birth/Sex: Treating RN: 24-Apr-1940 (79 y.o. [name] Primary Care Naydelin Ziegler: Alton Revere, [place] RY Other Clinician: Referring Gracielynn Birkel: Treating Kindle Strohmeier/Extender: Garret Reddish, [place] RY Weeks in Treatment: 105 Visit Information History Since Last Visit Added or deleted any medications: No Patient Arrived: Ambulatory Any new allergies or adverse reactions: No Arrival Time: 12:48 Had a fall or experienced change in No Accompanied By: self activities of daily living that may affect Transfer Assistance: None risk of falls: Patient Identification Verified: Yes Signs or symptoms of abuse/neglect since last visito No Secondary Verification Process Completed: Yes Hospitalized since last visit: No Patient Requires Transmission-Based Precautions: No Implantable device outside of the clinic excluding No Patient Has Alerts: Yes cellular tissue based products placed in the center Patient Alerts: R ABI= 1.23, TBI = .86 since last visit: L ABI= 1.28, TBI=.81 Has Dressing in Place as Prescribed: Yes Pain Present Now: No Electronic Signature(s) Signed: 10/06/2019 8:15:52 AM By: Sandre Kitty Entered By: Sandre Kitty on 10/05/2019 12:48:52 -------------------------------------------------------------------------------- Encounter Discharge Information Details Patient Name: Date of Service: Carlos White, GO RDO Carlos White. 10/05/2019 12:30 PM Medical Record Number: [id number] Patient Account Number: [id number] Date of Birth/Sex: Treating RN: 12-Feb-1940 (79 y.o. [name]) Carlene Coria Primary Care Isidor Bromell: Alton Re[place], [place] RY Other Clinician: Referring Mekisha Bittel: Treating Adella Manolis/Extender: Garret Reddish, MA RY Weeks in Treatment: 105 Encounter Discharge Information  Items Post Procedure Vitals Discharge Condition: Stable Temperature (F): 98.2 Ambulatory Status: Ambulatory Pulse (bpm): 79 Discharge Destination: Home Respiratory Rate (breaths/min): 16 Transportation: Private Auto Blood Pressure (mmHg): 136/73 Accompanied By: self Schedule Follow-up Appointment: Yes Clinical Summary of Care: Patient Declined Electronic Signature(s) Signed: 10/07/2019 4:51:09 PM By: Carlene Coria RN Entered By: Carlene Coria on 10/05/2019 13:47:17 -------------------------------------------------------------------------------- Lower Extremity Assessment Details Patient Name: Date of Service: Carlos White 10/05/2019 12:30 PM Medical Record Number: [id number] Patient Account Number: [id number] Date of Birth/Sex: Treating RN: Sep 13, 1940 (79 y.o. Janyth Contes Primary Care Khasir Woodrome: Alton Re[place], [place] RY Other Clinician: Referring Willi Borowiak: Treating Stacie Templin/Extender: Garret Reddish, MA RY Weeks in Treatment: 105 Edema Assessment Assessed: [Left: No] [Right: No] Edema: [Left: Yes] [Right: Yes] Calf Left: Right: Point of Measurement: 35.5 cm From Medial Instep 36 cm 34 cm Ankle Left: Right: Point of Measurement: 11 cm From Medial Instep 26 cm 24 cm Vascular Assessment Pulses: Dorsalis Pedis Palpable: [Left:Yes] [Right:Yes] Electronic Signature(s) Signed: 10/05/2019 4:54:19 PM By: Levan Hurst RN, BSN Entered By: Levan Hurst on 10/05/2019 13:05:55 -------------------------------------------------------------------------------- Multi-Disciplinary Care Plan Details Patient Name: Date of Service: Carlos White, GO RDO Carlos White. 10/05/2019 12:30 PM Medical Record Number: [id number] Patient Account Number: [id number] Date of Birth/Sex: Treating RN: 03-Jan-1941 (79 y.o. [name] Primary Care Raoul Ciano: Alton Re[place], [place] RY Other Clinician: Referring Zniya Cottone: Treating Emmy Keng/Extender: Garret Reddish, [place] RY Weeks in Treatment: [phone] Active  Inactive Venous Leg Ulcer Nursing Diagnoses: Knowledge deficit related to disease process and management Potential for venous Insuffiency (use before diagnosis confirmed) Goals: Patient will maintain optimal edema control Date Initiated: 09/30/2017 Target Resolution Date: 11/02/2019 Goal Status: Active Patient/caregiver will verbalize understanding of disease process and disease management Date Initiated: 09/30/2017 Date Inactivated: 11/04/2017 Target Resolution Date: 10/30/2017 Goal Status: Met Interventions: Assess peripheral edema status every visit. Provide education on venous  insufficiency Notes: Wound/Skin Impairment Nursing Diagnoses: Impaired tissue integrity Knowledge deficit related to ulceration/compromised skin integrity Goals: Patient/caregiver will verbalize understanding of skin care regimen Date Initiated: 09/30/2017 Target Resolution Date: 11/02/2019 Goal Status: Active Ulcer/skin breakdown will have a volume reduction of 30% by week 4 Date Initiated: 09/30/2017 Date Inactivated: 11/04/2017 Target Resolution Date: 10/30/2017 Goal Status: Met Interventions: Assess patient/caregiver ability to obtain necessary supplies Assess patient/caregiver ability to perform ulcer/skin care regimen upon admission and as needed Assess ulceration(s) every visit Provide education on ulcer and skin care Notes: Electronic Signature(s) Signed: 10/05/2019 5:11:27 PM By: Baruch Gouty RN, BSN Entered By: Baruch Gouty on 10/05/2019 13:16:22 -------------------------------------------------------------------------------- Pain Assessment Details Patient Name: Date of Service: Carlos White RDO Carlos White. 10/05/2019 12:30 PM Medical Record Number: [id number] Patient Account Number: [id number] Date of Birth/Sex: Treating RN: 1940-12-29 (79 y.o. [name] Primary Care Vaishali Baise: Alton [place], [place] RY Other Clinician: Referring Henretter Piekarski: Treating Londyn Hotard/Extender: Garret Reddish, [place] RY Weeks in Treatment: [phone] Active Problems Location of Pain Severity and Description of Pain Patient Has Paino No Site Locations Pain Management and Medication Current Pain Management: Electronic Signature(s) Signed: 10/05/2019 5:11:27 PM By: Baruch Gouty RN, BSN Signed: 10/06/2019 8:15:52 AM By: Sandre Kitty Entered By: Sandre Kitty on 10/05/2019 12:50:21 -------------------------------------------------------------------------------- Patient/Caregiver Education Details Patient Name: Date of Service: Carlos White 9/22/2021andnbsp12:30 PM Medical Record Number: [id number] Patient Account Number: [id number] Date of Birth/Gender: Treating RN: [birth year] (79 y.o. [name] Primary Care Physician: Alton Re[place], [place] RY Other Clinician: Referring Physician: Treating Physician/Extender: Garret Reddish, [place] RY Weeks in Treatment: [phone] Education Assessment Education Provided To: Patient Education Topics Provided Venous: Methods: Explain/Verbal Responses: Reinforcements needed, State content correctly Wound/Skin Impairment: Methods: Explain/Verbal Responses: Reinforcements needed, State content correctly Electronic Signature(s) Signed: 10/05/2019 5:11:27 PM By: Baruch Gouty RN, BSN Entered By: Baruch Gouty on 10/05/2019 13:16:49 -------------------------------------------------------------------------------- Wound Assessment Details Patient Name: Date of Service: Carlos White RDO Carlos White. 10/05/2019 12:30 PM Medical Record Number: [id number] Patient Account Number: [id number] Date of Birth/Sex: Treating RN: [birth year] (79 y.o. [name] Primary Care Justiss Gerbino: Alton [place], [place] RY Other Clinician: Referring Dhalia Zingaro: Treating Jamieon Lannen/Extender: Garret Reddish, [place] RY Weeks in Treatment: 105 Wound Status Wound Number: 17 Primary Venous Leg Ulcer Etiology: Wound Location: Right, Medial Malleolus Wound Status: Open Wounding Event:  Gradually Appeared Comorbid Cataracts, Chronic Obstructive Pulmonary Disease (COPD), Date Acquired: 09/28/2019 History: Hypertension Weeks Of Treatment: 0 Clustered Wound: No Photos Photo Uploaded By: Mikeal Hawthorne on 10/06/2019 11:59:31 Wound Measurements Length: (cm) 1.6 Width: (cm) 2 Depth: (cm) 0.3 Area: (cm) 2.513 Volume: (cm) 0.754 % Reduction in Area: 0% % Reduction in Volume: 0% Epithelialization: None Tunneling: No Undermining: No Wound Description Classification: Full Thickness Without Exposed Support Struct Wound Margin: Flat and Intact Exudate Amount: Medium Exudate Type: Serosanguineous Exudate Color: red, brown ures Foul Odor After Cleansing: No Slough/Fibrino Yes Wound Bed Granulation Amount: Small (1-33%) Exposed Structure Granulation Quality: Pink Fascia Exposed: No Necrotic Amount: Large (67-100%) Fat Layer (Subcutaneous Tissue) Exposed: Yes Necrotic Quality: Adherent Slough Tendon Exposed: No Muscle Exposed: No Joint Exposed: No Bone Exposed: No Treatment Notes Wound #17 (Right, Medial Malleolus) 1. Cleanse With Wound Cleanser Soap and water 3. Primary Dressing Applied Other primary dressing (specifiy in notes) 4. Secondary Dressing ABD Pad Dry Gauze Roll Gauze 5. Secured With Elastic bandage Notes fibrocal Electronic Signature(s) Signed: 10/05/2019 4:54:19 PM By: Levan Hurst RN, BSN Signed: 10/05/2019 5:11:27 PM  By: Baruch Gouty RN, BSN Entered By: Levan Hurst on 10/05/2019 13:06:21 -------------------------------------------------------------------------------- Wound Assessment Details Patient Name: Date of Service: Carlos White RDO Carlos White 10/05/2019 12:30 PM Medical Record Number: [id number] Patient Account Number: [id number] Date of Birth/Sex: Treating RN: [birth year] (79 y.o. [name] Primary Care Genavive Kubicki: Alton [place], [place] RY Other Clinician: Referring Bentleigh Stankus: Treating Carolyna Yerian/Extender: Garret Reddish, MA  RY Weeks in Treatment: 105 Wound Status Wound Number: 2 Primary Venous Leg Ulcer Etiology: Wound Location: Left, Distal, Lateral Lower Leg Wound Status: Open Wounding Event: Gradually Appeared Comorbid Cataracts, Chronic Obstructive Pulmonary Disease (COPD), Date Acquired: 03/13/2017 History: Hypertension Weeks Of Treatment: 105 Clustered Wound: No Photos Photo Uploaded By: Mikeal Hawthorne on 10/06/2019 11:59:55 Wound Measurements Length: (cm) 17 Width: (cm) 16 Depth: (cm) 0.1 Area: (cm) 213.628 Volume: (cm) 21.363 % Reduction in Area: -175.6% % Reduction in Volume: 44.9% Epithelialization: Small (1-33%) Tunneling: No Undermining: No Wound Description Classification: Full Thickness Without Exposed Support Structures Wound Margin: Flat and Intact Exudate Amount: Large Exudate Type: Serosanguineous Exudate Color: red, brown Foul Odor After Cleansing: No Slough/Fibrino Yes Wound Bed Granulation Amount: Large (67-100%) Exposed Structure Granulation Quality: Red Fascia Exposed: No Necrotic Amount: Small (1-33%) Fat Layer (Subcutaneous Tissue) Exposed: Yes Necrotic Quality: Adherent Slough Tendon Exposed: No Muscle Exposed: No Joint Exposed: No Bone Exposed: No Treatment Notes Wound #2 (Left, Distal, Lateral Lower Leg) 1. Cleanse With Wound Cleanser Soap and water 3. Primary Dressing Applied Other primary dressing (specifiy in notes) 4. Secondary Dressing ABD Pad Dry Gauze Roll Gauze 5. Secured With Elastic bandage Notes fibrocal Electronic Signature(s) Signed: 10/05/2019 4:54:19 PM By: Levan Hurst RN, BSN Signed: 10/05/2019 5:11:27 PM By: Baruch Gouty RN, BSN Entered By: Levan Hurst on 10/05/2019 13:06:43 -------------------------------------------------------------------------------- Wound Assessment Details Patient Name: Date of Service: Carlos White RDO Carlos White. 10/05/2019 12:30 PM Medical Record Number: [id number] Patient Account Number:  [id number] Date of Birth/Sex: Treating RN: 24-May-1940 (79 y.o. [name] Primary Care Trayvond Viets: Alton [place], [place] RY Other Clinician: Referring Eutha Cude: Treating Mercy Leppla/Extender: Garret Reddish, [place] RY Weeks in Treatment: 105 Wound Status Wound Number: 5 Primary Vasculopathy Etiology: Wound Location: Right, Proximal, Lateral Lower Leg Wound Status: Open Wounding Event: Gradually Appeared Comorbid Cataracts, Chronic Obstructive Pulmonary Disease (COPD), Date Acquired: 10/26/2017 History: Hypertension Weeks Of Treatment: 101 Clustered Wound: Yes Photos Photo Uploaded By: Mikeal Hawthorne on 10/06/2019 11:59:56 Wound Measurements Length: (cm) 10.5 Width: (cm) 24 Depth: (cm) 0.1 Clustered Quantity: 2 Area: (cm) 197.92 Volume: (cm) 19.792 % Reduction in Area: -264.6% % Reduction in Volume: -264.6% Epithelialization: Small (1-33%) Tunneling: No Undermining: No Wound Description Classification: Full Thickness Without Exposed Support Stru Wound Margin: Flat and Intact Exudate Amount: Large Exudate Type: Serosanguineous Exudate Color: red, brown ctures Foul Odor After Cleansing: No Slough/Fibrino Yes Wound Bed Granulation Amount: Large (67-100%) Exposed Structure Granulation Quality: Red Fascia Exposed: No Necrotic Amount: None Present (0%) Fat Layer (Subcutaneous Tissue) Exposed: Yes Tendon Exposed: No Muscle Exposed: No Joint Exposed: No Bone Exposed: No Treatment Notes Wound #5 (Right, Proximal, Lateral Lower Leg) 1. Cleanse With Wound Cleanser Soap and water 3. Primary Dressing Applied Other primary dressing (specifiy in notes) 4. Secondary Dressing ABD Pad Dry Gauze Roll Gauze 5. Secured With Elastic bandage Notes [employer]chant [employer] officer) Signed: 10/05/2019 4:54:19 PM By: Levan Hurst RN, BSN Signed: 10/05/2019 5:11:27 PM By: Baruch Gouty RN, BSN Entered By: Levan Hurst on 10/05/2019  13:07:00 -------------------------------------------------------------------------------- [place] Details Patient Name: Date of Service: Carlos White,  GO RDO N H. 10/05/2019 12:30 PM Medical Record Number: [id number] Patient Account Number: [id number] Date of Birth/Sex: Treating RN: 03/17/1940 (79 y.o. [name] Primary Care Zenith Kercheval: Alton [place], [place] RY Other Clinician: Referring Yatzary Merriweather: Treating Annabeth Tortora/Extender: Garret Reddish, [place] RY Weeks in Treatment: 105 Vital Signs Time Taken: 12:49 Temperature (F): 98.2 Height (in): 71 Pulse (bpm): 79 Weight (lbs): 220 Respiratory Rate (breaths/min): 16 Body Mass Index (BMI): 30.7 Blood Pressure (mmHg): 136/73 Reference Range: 80 - 120 mg / dl Electronic Signature(s) Signed: 10/06/2019 8:15:52 AM By: Sandre Kitty Entered By: Sandre Kitty on 10/05/2019 12:50:13

## 2019-10-10 LAB — AEROBIC CULTURE W GRAM STAIN (SUPERFICIAL SPECIMEN)

## 2019-10-11 DIAGNOSIS — I87312 Chronic venous hypertension (idiopathic) with ulcer of left lower extremity: Secondary | ICD-10-CM | POA: Diagnosis not present

## 2019-10-25 DIAGNOSIS — H43813 Vitreous degeneration, bilateral: Secondary | ICD-10-CM | POA: Diagnosis not present

## 2019-10-25 DIAGNOSIS — H353132 Nonexudative age-related macular degeneration, bilateral, intermediate dry stage: Secondary | ICD-10-CM | POA: Diagnosis not present

## 2019-10-25 DIAGNOSIS — H35033 Hypertensive retinopathy, bilateral: Secondary | ICD-10-CM | POA: Diagnosis not present

## 2019-11-02 ENCOUNTER — Encounter (HOSPITAL_BASED_OUTPATIENT_CLINIC_OR_DEPARTMENT_OTHER): Payer: Medicare PPO | Attending: Physician Assistant | Admitting: Physician Assistant

## 2019-11-02 ENCOUNTER — Other Ambulatory Visit: Payer: Self-pay

## 2019-11-02 DIAGNOSIS — L97829 Non-pressure chronic ulcer of other part of left lower leg with unspecified severity: Secondary | ICD-10-CM | POA: Diagnosis present

## 2019-11-02 DIAGNOSIS — S82892S Other fracture of left lower leg, sequela: Secondary | ICD-10-CM | POA: Diagnosis not present

## 2019-11-02 DIAGNOSIS — L97812 Non-pressure chronic ulcer of other part of right lower leg with fat layer exposed: Secondary | ICD-10-CM | POA: Diagnosis not present

## 2019-11-02 DIAGNOSIS — L97822 Non-pressure chronic ulcer of other part of left lower leg with fat layer exposed: Secondary | ICD-10-CM | POA: Insufficient documentation

## 2019-11-02 DIAGNOSIS — I872 Venous insufficiency (chronic) (peripheral): Secondary | ICD-10-CM | POA: Insufficient documentation

## 2019-11-02 DIAGNOSIS — Q819 Epidermolysis bullosa, unspecified: Secondary | ICD-10-CM | POA: Diagnosis not present

## 2019-11-02 DIAGNOSIS — L97322 Non-pressure chronic ulcer of left ankle with fat layer exposed: Secondary | ICD-10-CM | POA: Diagnosis not present

## 2019-11-02 DIAGNOSIS — X58XXXS Exposure to other specified factors, sequela: Secondary | ICD-10-CM | POA: Insufficient documentation

## 2019-11-02 DIAGNOSIS — L97312 Non-pressure chronic ulcer of right ankle with fat layer exposed: Secondary | ICD-10-CM | POA: Diagnosis not present

## 2019-11-02 DIAGNOSIS — I10 Essential (primary) hypertension: Secondary | ICD-10-CM | POA: Insufficient documentation

## 2019-11-02 NOTE — Progress Notes (Signed)
TREW, SUNDE ([id number]) Visit Report for 11/02/2019 Arrival Information Details Patient Name: Date of Service: Carlos White 11/02/2019 12:30 PM Medical Record Number: [id number] Patient Account Number: [id number] Date of Birth/Sex: Treating RN: 1940/12/16 (79 y.o. Carlos White Primary Care Carlos White: Carlos White Other Clinician: Referring Madysin Crisp: Treating Giovannina Mun/Extender: [name] in Treatment: 109 Visit Information History Since Last Visit Added or deleted any medications: No Patient Arrived: Ambulatory Any new allergies or adverse reactions: No Arrival Time: 13:16 Had a fall or experienced change in No Accompanied By: self activities of daily living that may affect Transfer Assistance: None risk of falls: Patient Identification Verified: Yes Signs or symptoms of abuse/neglect since last visito No Secondary Verification Process Completed: Yes Hospitalized since last visit: No Patient Requires Transmission-Based Precautions: No Implantable device outside of the clinic excluding No Patient Has Alerts: Yes cellular tissue based products placed in the center Patient Alerts: R ABI= 1.23, TBI = .86 since last visit: L ABI= 1.28, TBI=.81 Has Dressing in Place as Prescribed: Yes Pain Present Now: No Electronic Signature(s) Signed: 11/02/2019 4:37:14 PM By: Kela Millin Entered By: Kela Millin on 11/02/2019 13:16:30 -------------------------------------------------------------------------------- Encounter Discharge Information Details Patient Name: Date of Service: Carlos White, Carlos RDO Carlos Hanger. 11/02/2019 12:30 PM Medical Record Number: [id number] Patient Account Number: [id number] Date of Birth/Sex: Treating RN: 02-10-1940 (79 y.o. Carlos White Primary Care Enda Santo: Carlos White Other Clinician: Referring Arlethia Basso: Treating [name]/Extender: [name] in Treatment: [phone] Encounter Discharge  Information Items Post Procedure Vitals Discharge Condition: Stable Temperature (F): 98.1 Ambulatory Status: Ambulatory Pulse (bpm): 72 Discharge Destination: Home Respiratory Rate (breaths/min): 18 Transportation: Private Auto Blood Pressure (mmHg): 143/72 Accompanied By: self Schedule Follow-up Appointment: No Clinical Summary of Care: Electronic Signature(s) Signed: 11/02/2019 4:37:14 PM By: Kela Millin Entered By: Kela Millin on 11/02/2019 13:48:53 -------------------------------------------------------------------------------- Lower Extremity Assessment Details Patient Name: Date of Service: Carlos White 11/02/2019 12:30 PM Medical Record Number: [id number] Patient Account Number: [id number] Date of Birth/Sex: Treating RN: [birth year] (79 y.o. Carlos White Primary Care Stepan Verrette: Carlos White Other Clinician: Referring Harshil Cavallaro: Treating Kirubel Aja/Extender: [name] in Treatment: 109 Edema Assessment Assessed: [Left: No] [Right: No] Edema: [Left: Yes] [Right: Yes] Calf Left: Right: Point of Measurement: 35.5 cm From Medial Instep 37 cm 34.5 cm Ankle Left: Right: Point of Measurement: 11 cm From Medial Instep 25.5 cm 24 cm Vascular Assessment Pulses: Dorsalis Pedis Palpable: [Left:Yes] [Right:Yes] Electronic Signature(s) Signed: 11/02/2019 4:37:14 PM By: Kela Millin Entered By: Kela Millin on 11/02/2019 13:17:29 -------------------------------------------------------------------------------- Multi-Disciplinary Care Plan Details Patient Name: Date of Service: Carlos White, Carlos RDO N H. 11/02/2019 12:30 PM Medical Record Number: [id number] Patient Account Number: [id number] Date of Birth/Sex: Treating RN: 02/09/1940 (79 y.o. Carlos White Primary Care Carlos White: Carlos White Other Clinician: Referring Tamarick Kovalcik: Treating Brittannie Tawney/Extender: [name] in Treatment: [phone] Active  Inactive Venous Leg Ulcer Nursing Diagnoses: Knowledge deficit related to disease process and management Potential for venous Insuffiency (use before diagnosis confirmed) Goals: Patient will maintain optimal edema control Date Initiated: 09/30/2017 Target Resolution Date: 11/30/2019 Goal Status: Active Patient/caregiver will verbalize understanding of disease process and disease management Date Initiated: 09/30/2017 Date Inactivated: 11/04/2017 Target Resolution Date: 10/30/2017 Goal Status: Met Interventions: Assess peripheral edema status every visit. Provide education on venous insufficiency Notes: Wound/Skin Impairment Nursing Diagnoses: Impaired tissue integrity Knowledge deficit related to ulceration/compromised skin integrity Goals: Patient/caregiver will verbalize understanding  of skin care regimen Date Initiated: 09/30/2017 Target Resolution Date: 11/30/2019 Goal Status: Active Ulcer/skin breakdown will have a volume reduction of 30% by week 4 Date Initiated: 09/30/2017 Date Inactivated: 11/04/2017 Target Resolution Date: 10/30/2017 Goal Status: Met Interventions: Assess patient/caregiver ability to obtain necessary supplies Assess patient/caregiver ability to perform ulcer/skin care regimen upon admission and as needed Assess ulceration(s) every visit Provide education on ulcer and skin care Notes: Electronic Signature(s) Signed: 11/02/2019 4:55:29 PM By: Baruch Gouty RN, BSN Entered By: Baruch Gouty on 11/02/2019 13:20:44 -------------------------------------------------------------------------------- Pain Assessment Details Patient Name: Date of Service: Carlos Dull RDO Carlos Hanger. 11/02/2019 12:30 PM Medical Record Number: [id number] Patient Account Number: [id number] Date of Birth/Sex: Treating RN: 12/15/1940 (79 y.o. Carlos White Primary Care Carlos White: Carlos White Other Clinician: Referring Jayley Hustead: Treating Jillann Charette/Extender: [name] in Treatment: [phone] Active Problems Location of Pain Severity and Description of Pain Patient Has Paino No Site Locations Pain Management and Medication Current Pain Management: Electronic Signature(s) Signed: 11/02/2019 4:37:14 PM By: Kela Millin Entered By: Kela Millin on 11/02/2019 13:17:05 -------------------------------------------------------------------------------- Patient/Caregiver Education Details Patient Name: Date of Service: Carlos White 10/20/2021andnbsp12:30 PM Medical Record Number: [id number] Patient Account Number: [id number] Date of Birth/Gender: Treating RN: 07/20/1940 (79 y.o. Carlos White Primary Care Physician: Carlos White Other Clinician: Referring Physician: Treating Physician/Extender: [name] in Treatment: 109 Education Assessment Education Provided To: Patient Education Topics Provided Venous: Methods: Explain/Verbal Responses: Reinforcements needed, State content correctly Wound/Skin Impairment: Methods: Explain/Verbal Responses: Reinforcements needed, State content correctly Electronic Signature(s) Signed: 11/02/2019 4:55:29 PM By: Baruch Gouty RN, BSN Entered By: Baruch Gouty on 11/02/2019 13:21:07 -------------------------------------------------------------------------------- Wound Assessment Details Patient Name: Date of Service: Carlos Dull RDO Carlos Hanger. 11/02/2019 12:30 PM Medical Record Number: [id number] Patient Account Number: [id number] Date of Birth/Sex: Treating RN: Feb 04, 1940 (79 y.o. Carlos White Primary Care Tehila Sokolow: Carlos White Other Clinician: Referring Elishah Ashmore: Treating Shimeka Bacot/Extender: [name] in Treatment: 109 Wound Status Wound Number: 17 Primary Venous Leg Ulcer Etiology: Wound Location: Right, Medial Malleolus Wound Status: Open Wounding Event: Gradually Appeared Comorbid Cataracts, Chronic  Obstructive Pulmonary Disease (COPD), Date Acquired: 09/28/2019 History: Hypertension Weeks Of Treatment: 4 Clustered Wound: No Wound Measurements Length: (cm) 1.9 Width: (cm) 1.9 Depth: (cm) 0.4 Area: (cm) 2.835 Volume: (cm) 1.134 Wound Description Classification: Full Thickness Without Exposed Support Structu Wound Margin: Well defined, not attached Exudate Amount: Medium Exudate Type: Serosanguineous Exudate Color: red, brown Foul Odor After Cleansing: Slough/Fibrino % Reduction in Area: -12.8% % Reduction in Volume: -50.4% Epithelialization: None Tunneling: No Undermining: No res No Yes Wound Bed Granulation Amount: Small (1-33%) Exposed Structure Granulation Quality: Pink Fascia Exposed: No Necrotic Amount: Large (67-100%) Fat Layer (Subcutaneous Tissue) Exposed: Yes Necrotic Quality: Adherent Slough Tendon Exposed: No Muscle Exposed: No Joint Exposed: No Bone Exposed: No Treatment Notes Wound #17 (Right, Medial Malleolus) 1. Cleanse With Wound Cleanser 3. Primary Dressing Applied Santyl 4. Secondary Dressing Roll Gauze Other secondary dressing (specify in notes) Notes moistened saline gauze over santyl Electronic Signature(s) Signed: 11/02/2019 4:37:14 PM By: Kela Millin Entered By: Kela Millin on 11/02/2019 13:19:38 -------------------------------------------------------------------------------- Wound Assessment Details Patient Name: Date of Service: Carlos Dull RDO Carlos Hanger. 11/02/2019 12:30 PM Medical Record Number: [id number] Patient Account Number: [id number] Date of Birth/Sex: Treating RN: 1940/06/30 (79 y.o. Carlos White Primary Care Maeve Debord: Carlos White Other Clinician: Referring Calee Nugent: Treating Arlin Sass/Extender: [name] in Treatment:  109 Wound Status Wound Number: 2 Primary Venous Leg Ulcer Etiology: Wound Location: Left, Distal, Lateral Lower Leg Wound Status: Open Wounding Event:  Gradually Appeared Comorbid Cataracts, Chronic Obstructive Pulmonary Disease (COPD), Date Acquired: 03/13/2017 History: Hypertension Weeks Of Treatment: 109 Clustered Wound: No Wound Measurements Length: (cm) 17 Width: (cm) 14.7 Depth: (cm) 0.2 Area: (cm) 196.271 Volume: (cm) 39.254 % Reduction in Area: -153.2% % Reduction in Volume: -1.3% Epithelialization: Small (1-33%) Tunneling: No Undermining: No Wound Description Classification: Full Thickness Without Exposed Support Structures Wound Margin: Flat and Intact Exudate Amount: Large Exudate Type: Serosanguineous Exudate Color: red, brown Foul Odor After Cleansing: No Slough/Fibrino Yes Wound Bed Granulation Amount: Large (67-100%) Exposed Structure Granulation Quality: Red Fascia Exposed: No Necrotic Amount: Small (1-33%) Fat Layer (Subcutaneous Tissue) Exposed: Yes Necrotic Quality: Adherent Slough Tendon Exposed: No Muscle Exposed: No Joint Exposed: No Bone Exposed: No Treatment Notes Wound #2 (Left, Distal, Lateral Lower Leg) 1. Cleanse With Wound Cleanser Soap and water 3. Primary Dressing Applied Other primary dressing (specifiy in notes) 4. Secondary Dressing ABD Pad Roll Gauze Notes patient will apply keystone compound when he gets home, requested non-adherent and kerlix for now Electronic Signature(s) Signed: 11/02/2019 4:37:14 PM By: Kela Millin Entered By: Kela Millin on 11/02/2019 13:19:02 -------------------------------------------------------------------------------- Wound Assessment Details Patient Name: Date of Service: Carlos Dull RDO Carlos Hanger. 11/02/2019 12:30 PM Medical Record Number: [id number] Patient Account Number: [id number] Date of Birth/Sex: Treating RN: 1940-02-17 (79 y.o. Carlos White Primary Care Django Nguyen: Carlos White Other Clinician: Referring Angelino Rumery: Treating Tambria Pfannenstiel/Extender: [name] in Treatment: 109 Wound Status Wound  Number: 5 Primary Vasculopathy Etiology: Wound Location: Right, Proximal, Lateral Lower Leg Wound Status: Open Wounding Event: Gradually Appeared Comorbid Cataracts, Chronic Obstructive Pulmonary Disease (COPD), Date Acquired: 10/26/2017 History: Hypertension Weeks Of Treatment: 105 Clustered Wound: Yes Wound Measurements Length: (cm) 10.5 Width: (cm) 15.5 Depth: (cm) 0.2 Clustered Quantity: 2 Area: (cm) 127.824 Volume: (cm) 25.565 % Reduction in Area: -135.5% % Reduction in Volume: -370.9% Epithelialization: Small (1-33%) Tunneling: No Undermining: No Wound Description Classification: Full Thickness Without Exposed Support Structures Wound Margin: Flat and Intact Exudate Amount: Large Exudate Type: Serosanguineous Exudate Color: red, brown Foul Odor After Cleansing: No Slough/Fibrino No Wound Bed Granulation Amount: Large (67-100%) Exposed Structure Granulation Quality: Red Fascia Exposed: No Necrotic Amount: None Present (0%) Fat Layer (Subcutaneous Tissue) Exposed: Yes Tendon Exposed: No Muscle Exposed: No Joint Exposed: No Bone Exposed: No Treatment Notes Wound #5 (Right, Proximal, Lateral Lower Leg) 1. Cleanse With Wound Cleanser Soap and water 3. Primary Dressing Applied Other primary dressing (specifiy in notes) 4. Secondary Dressing ABD Pad Roll Gauze Notes patient will apply keystone compound when he gets home, requested non-adherent and kerlix for now Electronic Signature(s) Signed: 11/02/2019 4:37:14 PM By: Kela Millin Entered By: Kela Millin on 11/02/2019 13:18:20 -------------------------------------------------------------------------------- Vitals Details Patient Name: Date of Service: Carlos White, Carlos RDO N H. 11/02/2019 12:30 PM Medical Record Number: [id number] Patient Account Number: [id number] Date of Birth/Sex: Treating RN: 1940-05-16 (79 y.o. Carlos White Primary Care Kaston Faughn: Carlos White Other Clinician: Referring  Kollen Armenti: Treating Malajah Oceguera/Extender: [name] in Treatment: 109 Vital Signs Time Taken: 13:00 Temperature (F): 98.1 Height (in): 71 Pulse (bpm): 72 Weight (lbs): 220 Respiratory Rate (breaths/min): 18 Body Mass Index (BMI): 30.7 Blood Pressure (mmHg): 143/72 Reference Range: 80 - 120 mg / dl Electronic Signature(s) Signed: 11/02/2019 4:37:14 PM By: Kela Millin Entered By: Kela Millin on 11/02/2019 13:16:59

## 2019-11-02 NOTE — Progress Notes (Addendum)
CONO, GEBHARD ([id number]) Visit Report for 11/02/2019 Chief Complaint Document Details Patient Name: Date of Service: [name] 11/02/2019 12:30 PM Medical Record Number: [id number] Patient Account Number: [id number] Date of Birth/Sex: Treating RN: February 04, 1940 (79 y.o. [name] Primary Care Provider: [name] Other Clinician: Referring Provider: Treating Provider/Extender: [name] in Treatment: 109 Information Obtained from: Patient Chief Complaint Bilateral LE Ulcers Electronic Signature(s) Signed: 11/02/2019 1:08:29 PM By: Worthy Keeler PA-C Entered By: Worthy Keeler on 11/02/2019 13:08:29 -------------------------------------------------------------------------------- Debridement Details Patient Name: Date of Service: [name] 11/02/2019 12:30 PM Medical Record Number: [id number] Patient Account Number: [id number] Date of Birth/Sex: Treating RN: 02/12/1940 (79 y.o. [name] Primary Care Provider: [name] Other Clinician: Referring Provider: Treating Provider/Extender: [name] in Treatment: 109 Debridement Performed for Assessment: Wound #17 Right,Medial Malleolus Performed By: Clinician Kela Millin, RN Debridement Type: Chemical/Enzymatic/Mechanical Agent Used: Santyl Severity of Tissue Pre Debridement: Fat layer exposed Level of Consciousness (Pre-procedure): Awake and Alert Pre-procedure Verification/Time Out Yes - 13:38 Taken: Bleeding: None Response to Treatment: Procedure was tolerated well Level of Consciousness (Post- Awake and Alert procedure): Post Debridement Measurements of Total Wound Length: (cm) 1.9 Width: (cm) 1.9 Depth: (cm) 0.4 Volume: (cm) 1.134 Character of Wound/Ulcer Post Debridement: Requires Further Debridement Severity of Tissue Post Debridement: Fat layer exposed Post Procedure Diagnosis Same as Pre-procedure Electronic  Signature(s) Signed: 11/02/2019 4:35:17 PM By: Worthy Keeler PA-C Signed: 11/02/2019 4:55:29 PM By: Baruch Gouty RN, BSN Entered By: Baruch Gouty on 11/02/2019 13:38:51 -------------------------------------------------------------------------------- HPI Details Patient Name: Date of Service: [name], GO RDO N H. 11/02/2019 12:30 PM Medical Record Number: [id number] Patient Account Number: [id number] Date of Birth/Sex: Treating RN: 1940-12-26 (79 y.o. [name] Primary Care Provider: [name] Other Clinician: Referring Provider: Treating Provider/Extender: [name] in Treatment: 109 History of Present Illness HPI Description: 09/30/17 on evaluation today patient presents for initial evaluation and our clinic concerning issues that he has been having with his bilateral lower extremities. He states this has been going on for quite some time at least six months. Currently his regiment has been mainly cleaning the area with peroxide, applying the is foreign ointment, and wrapping the area with ABD pads and then an ace wrap loosely. He has dealt with issues of this nature he tells me for quite some time. He does have a history of having had a compound fracture of the left lower extremity which he thinks also makes this a much more difficult area for him to heal. He's previously been told that he had poor vascular flow but this was years ago at [facility] we do not have any of those records at this time. He has a history of Epidermolysis Bullosa which was diagnosed around age 7 and he has been cared for at [facility] since that time. Subsequently he states this is hereditary and two of his children one male and one male also have this as well is one of his grandchildren that he is aware of. He has no evidence of infection necessarily at this point although he does have some necrotic tissue noted on the surface of the wound as far as the largest, left lateral lower  extremity ulcer, is concerned. Overall I feel like all things considered he's been taking care of this very well. Obviously he has some fairly significant issues going on at this point in  this regard. He does have a history otherwise of hypertension though for the most part other than the compound fracture of the left leg he seems to have been fairly healthy in my pinion. 10/07/17 on evaluation today patient actually appears to be doing better in regard to his bilateral lower extremity ulcers. With that being said he does still have some evidence of slough noted on the surface of the wounds I think the Iodoflex has been beneficial for him. His arterial studies are scheduled for October 2. With that being said I do believe that he is continuing to show signs of good improvement which is at least good news. 10/14/17 on evaluation today patient appears to be doing very well in regard to his lower extremity ulcers. He's definitely made some progress as far as healing is concerned although there still are several open areas that are going to need to be addressed. He did have his arterial study today which fortunately shows good findings with a right ABI of 1.23 with a TBI of 0.86 in the left ABI of 1.28 with a TBI of 0.81. This is good news and will allow [place] to perform debridement as well. 10/23/2017; patient with a large wound on the left lateral calf, sizable area on the left medial malleolus and an area on the right lateral malleolus. He has a new blister consistent with his underlying blistering skin disease just above this area we have been using Iodoflex on the lateral left calf lateral right ankle and collagen on the medial left ankle. We have been using Kerlix Coban wraps 10/28/17 on evaluation today the patient continues to have signs of improvement in regard to the overall appearance of the original wound. Unfortunately he did have some blistering over the right lateral lower extremity which has  appeared to rupture on evaluation today and likely some of the dead tissue on the surface needs to be cleaned away the good news is this does not appear to be to significantly deep at this time. 11/04/17 on evaluation today patient actually appears to be doing a little better in regard to his lower extremity ulcers. He has been tolerating the dressing changes without complication. With that being said he does still have a significant wound especially over the left lateral lower extremity unfortunately. All of the wounds pretty much are going to require sharp debridement today. 11/11/17 on evaluation today patient appears to be doing more poorly in regard to his left lower extremity in particular. There does not appear to be any evidence of systemic infection although the wound itself as far as the larger left lateral lower extremity ulcer actually appears to be infected in my pinion. There's an older and the surface of the wound is dramatically worsened compared to last week. No fevers, chills, nausea, or vomiting noted at this time. 11/18/17 upon evaluation today patient actually appears to be doing better. I did review his culture today which really did not show any specific organism is a positive reason for his wound decline. There are multiple organisms present not predominant. Nonetheless he seems to be tolerate the doxycycline well in his wounds in general do seem to be doing better. Fortunately there does not appear to be any evidence of infection at this time which is good news. Overall I'm very pleased with how things appear. Nonetheless he still has a lot of healing to go. I do think he could benefit from a Juxta-Lite wrap. 11/25/17 on evaluation today patient actually appears to  be doing fairly well in regard to his wounds. He is still taking the antibiotics he has a few days left. Fortunately this seems to have been excellent for him as far as getting the infection control and very happy in  this regard. With that being said the patient likewise is also very pleased with how things appear at this time in comparison to where we were he's not having as much pain. 12/02/17 Seen today for follow p and management of LLE wounds. Wounds appear to show some improvement. He denies pain, fever, or chills. Completed a course of doxycycline earlier this month. Scheduled to received Juxta-Lite wrap this week. No s/s of infections. 12/09/17 on evaluation today patient actually appears to be doing a little bit better in regard to his wounds. This is obscene very slow process and unfortunately he has a couple of new areas and this is due to the Epidermolysis Bullosa. Nonetheless I am concerned about the fact that he seems to be getting more areas not less that is the reason we're gonna work on getting schedule for the vascular referral to see the venous specialist. 12/23/17 upon evaluation today patient's wounds currently shows evidence of still not doing quite as well is what I would like to have seen. Subsequently the patient did have his venous study which showed evidence of venous stasis. Subsequently I do think that a vascular evaluation for consideration of venous intervention would be appropriate. I'm not necessarily suggesting that will be anything that can be done but I think it is at least a good idea. He is in agreement with this plan. 12/30/17 on evaluation today patient actually appears to be doing very well in regard to his wounds when compared to previous evaluation. Subsequently we have been using the [facility] Dressing which actually appears to have done excellent on his left lateral lower extremity ulcer. The quality of the wound surface is dramatically improved. There is some slight debridement that is going to be required at a couple of locations but overall I'm extremely pleased with how things appear here. 01/07/2018; this is a patient with a primary skin disorder epidermolyis  bullosa. Is a large wound on the left lateral calf and smaller wounds on the right however there is a new wound on the right mid tibia area that occurred within the compression wrap that he did not change. We have been using Hydrofera Blue. On the left he is using Hydrofera Blue and Santyl to the inferior part of the wound and changing the dressing himself. 01/15/2018; primary skin disorder epidermolysis bullosa. He has several difficult wounds including the left lateral calf, smaller wounds on the left medial calf and the right lateral calf. The major area on the left lateral calf has a smaller area inferiorly that has necrotic debris we have been using Santyl to this. The rest of the wounds we have been using Hydrofera Blue. The area on the left calf actually looks larger this week. Uncontrolled edema several small open areas above it that are superficial 01/20/18 on evaluation today patient appears to be doing better as compared to last week in regard to his wounds of the bilateral lower extremities. He tolerated the bilateral compression wrap without complication. Overall I'm very pleased with how things appear at this time. The patient likewise is very happy. 01/27/18 on evaluation today patient appears to be doing decently well in regard to his bilateral lower extremity ulcers. He's been tolerating the dressing changes without complication. One issue  he had is that he did have more drainage to the left leg wrapped last week. He states in fact he probably should come in and let us change it on Friday however he just left it in place and kept adding extra absorption with ABD pads to the external portion of the wrap. Unfortunately he does have some aspiration type breakdown nothing significant but I do believe that this was probably counterproductive in general. Nonetheless his wounds do not appear to be terrible overall. 02/03/18 on evaluation today patient appears to be doing rather well in regard to  his lower extremity ulcers. He has been tolerating the dressing changes without complication. He does tell me that he had to change the wrap on the left one since we last saw him. Subsequently I do not see any evidence of infection I do feel like the food was much better controlled at this point. 02/10/18 on evaluation today patient appears to be doing rather well in regard to his ulcers. He still has significant alterations especially on the left lateral lower extremity. Fortunately there's no signs of infection at this time. Overall I feel like he is making good progress are some areas that I'm gonna attempt some debridement today. 02/17/18 on evaluation today patient appears to be doing okay in regard to his lower Trinity ulcer. It does appear on both locations he has a little bit of drainage causing some breakdown in maceration around the wound bed's although it doesn't appear to be too bad the right is a little bit worse than left. Fortunately there's no signs of infection which is good news. No fevers, chills, nausea, or vomiting noted at this time. 03/10/18 on evaluation today patient actually appears to be doing rather poorly in regard to his bilateral lower extremity ulcers. The right in particular is draining profusely and the wound is actually enlarging which is not good. I'm concerned about both possibly infection and the fact that there's a lot of moisture which is causing breakdown as well. Unfortunately the patient has been trying to change this at home I'm afraid he may need to change more frequently in order to see the improvement that were looking for. There's no signs of systemic infection. 03/17/18 patient actually appears to be doing significantly better at this point in regard to his bilateral lower extremity ulcers. Fortunately there's no signs of infection. That is worsening infection at least indefinitely nothing systemic. With that being said he is having a lot of drainage though  not quite as much is there in his last evaluation. Overall feel like he's on the side of improvement. I think if his results back from his culture which showed that he had a positive group B strep along with abundant Pseudomonas noted on the culture. For that reason I am gonna have him continue with the linezolid as we previously have ordered for him and I did go ahead as well today and prescribe Levaquin as well in order to treat the Pseudomonas portion of the infection noted. 03/24/18 on evaluation today patient actually appears to be doing very well in regard to his lower Trinity ulcer. He's been tolerating the dressing changes without complication. Fortunately both legs show signs of less drainage in his edema is very well controlled at this point as well. Overall very pleased with how things seem to be progressing. 03/31/18 on evaluation today patient actually appears to be doing excellent in regard to his bilateral lower extremity ulcers. These are not draining nearly as  significant as what they were in the past overall seem to be shown signs of excellent improvement which is great news. Fortunately there is no sign of active infection at this time I do believe that the Levaquin has done extremely well for him in this regard. The patient continues to change these at home typically every day. We may be able to slowly work towards every other day changes since the drainage seems to be slowing down quite significantly. 04/14/18 on evaluation today patient appears to be doing well in regard to his bilateral lower extremities. Let me Hilda Blades Almost completely healed which is excellent news. Fortunately he's shown signs of improvement all other sites as well with new skin growth there's some slight hyper granular tissue but for the most part this seems to be well maintained with the [facility] Dressing. I'm very happy in this regard. 04/28/18 on evaluation today patient appears to be doing rather well  in regard to his ulcers of the bilateral lower extremities all things considering. He continues to make some progress as far as new skin growth. There still some hyper granulation noted at this point despite the use of the [facility] Dressing. This is not terrible but I think we may want to consider conclude cauterization today with silver nitrate to try to help knock some of his back as well as helping with any biofilm on the surface of the wound. 05/12/18 on evaluation today patient's wounds actually appear to be doing fairly well in regard to the bilateral lower extremities. He's been tolerating the dressing changes without complication. Fortunately there's no signs of active infection at this time which is good news. Overall very pleased with how things seem to be progressing. You select silver nitrate was beneficial for him. 05/26/18 on evaluation today patient appears to be doing better in regard to left lower extremity and a little bit worse in regard to the right lower extremity. He states that he was pulling off the [facility] Dressing peel back some of the skin making this area significantly larger than what it was previous. He's not had any issues other than this and states even that hasn't caused any pain he just seems to obviously have a much larger area on the right when compared to the previous time I saw him. No fevers, chills, nausea, or vomiting noted at this time. 06/16/18 on evaluation today patient actually appears to be doing a little better in my pinion in regard to his lower summary ulcers. He has new skin islands that seem to be spreading which is good news. Fortunately there's no signs of active infection at this time. His biggest issue is he tells me that coming as often as he does is becoming very cost prohibitive. He wonders if we can potentially spread this out. 07/14/18 on evaluation today patient appears to be doing a little bit worse in regard to his lower from the  ulcer. Unfortunately he still continues to have a significant amount of drainage I think we need to do something to try to help this more. He is still somewhat reluctant to go the route of the Wound VAC although that may be the most appropriate thing for him. No fevers, chills, nausea, or vomiting noted at this time. 08/18/2018 on evaluation today patient actually appears to be doing quite well with regard to his bilateral lower extremity ulcers. I do feel like that currently he is making great progress the care max does seem to be doing  a great job at helping to control the moisture he has no maceration or skin breakdown. Again this seems to be an excellent way to go. 1 thing we may want to change is adding collagen to the base of the wound and then the care max over top he is not opposed to this. 09/15/2018 on evaluation today patient appears to be doing well with regard to his bilateral lower extremity ulcers. He is showing some signs of improvement not necessarily in size but definitely in appearance. In fact he has a lot of new skin growing throughout the wounds along the edges as well as in the central portion of the wounds on both lower extremities. Overall I am extremely pleased to see this. 10/20/2018 on evaluation today patient actually appears to be doing quite well with regard to his wounds. They are not measuring significantly smaller but he does have a lot of new epithelization noted as compared to previous. Fortunately there is no signs of active infection at this time. No fevers, chills, nausea, vomiting, or diarrhea. 11/17/2018 on evaluation today patient presents for reevaluation concerning his bilateral lower extremity ulcers. Fortunately there is no signs of active infection at this time today. He has been tolerating the dressing changes without complication. No fevers, chills, nausea, vomiting, or diarrhea. Unfortunately in general the patient has not made as much improvement as I would  like to have seen up to this point. He has been tolerating the dressing changes without complication and he does an excellent job taking care of his wounds at home in my opinion. The biggest issue I see is that he is just not making the progress that we need to be seeing currently. I think we may want to consider having him seen at a plastic surgery appointment and he has previously seen someone in years past at [facility] in [place]. That is definitely a possibility for [place] to look into at this point. 12/29/2018 on evaluation today patient appears to be doing better in regard to the overall visual appearance of his wounds which do not appear to be as macerated. He does have a much larger skin island in the middle of the left lower extremity ulcer which is doing much better. He tells me the pain is also significantly better. With that being said overall his improvement as far as the size of the wounds is not better but again these are very irregular in change shape quite often. Fortunately there is no evidence of active infection at this time which is great news. He never heard anything from [facility] regarding the plastic surgery referral that we made to them. 01/26/2019 upon evaluation today patient appears to be doing a little better in regard to his wounds today. He has been tolerating the dressing changes again he performs these for the most part on his own. He does a great job wrapping his legs in my opinion. Unfortunately he has not been able to get down to [facility] to see if there is anything from a plastic surgery standpoint that could be done to help with his legs simply due to the fact that his wife unfortunately sustained a compression fracture in her spine she is seeing Dr. Saintclair Halsted and subsequently is going to be having what sounds to be a kyphoplasty type procedure. With that being said that has not been scheduled yet there is still waiting on an MRI the patient is very busy  in fact overly busy trying to help take  care of his wife at this point. I completely understand this is more of a strain on him at this time 02/23/2019 upon evaluation today patient actually appears to be making some progress. I am actually very pleased with the overall appearance of his wounds even compared to last evaluation. He seems to be doing quite well. He is taking care of his wife unfortunately she did have a compression fracture she has had the procedure for this but still she has a slow road to recovery. For that reason he still not gone to [facility] for a second opinion in this regard. Obviously the goal there was if there was anything that can be done from a skin graft standpoint or otherwise. 03/23/2019 upon evaluation today patient continues to have issues with lower extremity ulcers. Since the beginning he has made progress but at the same time the wounds unfortunately just will not close. We have been trying to get the patient to [facility] to see a specialist there but unfortunately with the everything going on with his wife he has not been able to make that appointment time yet he states he may be able to in the next 1-2 months but is not really sure. 04/27/2019 on evaluation today patient appears to be doing a little bit more poorly. His last evaluation. He appears to have some erythema around the edges of the wound at this point. Fortunately there is no signs of active infection at this time which is good news. No fevers, chills, nausea, vomiting, or diarrhea. 06/08/2019 on evaluation today patient appears to be doing well with regard to his wounds. Overall they are actually measuring smaller compared to the last visit last month. We did treat him for Pseudomonas as well as methicillin-resistant Staph aureus. He was only on the treatment for MRSA however for 7 days as the Cipro was resistant and subsequently we had to place him on doxycycline. Nonetheless I am  thinking that we may want to add the doxycycline and just do a month-long treatment considering the longstanding nature of his wounds and see if we get this under better control. 07/13/2019 upon evaluation today patient appears to be doing fairly well in regard to his bilateral lower extremities. There does not appear to be any signs of active infection which is good news. No fevers, chills, nausea, vomiting, or diarrhea. 08/10/2019 upon evaluation today patient appears to be doing about the same with regard to his wounds in general. Unfortunately he is not significantly better although is also not significantly worse which is great news there is no evidence of active infection at this time which is good news. He still dealing with a lot going on with his wife and therefore is not really able to go see anyone at the specialty clinic at [facility] that we have previously set up still. 09/07/2019 on evaluation today patient appears to be doing well with regard to his wounds. In fact this is probably the best that have seen so far in quite a few months. Overall I am very pleased with where things stand at this time. No fevers, chills, nausea, vomiting, or diarrhea. 10/05/2019 upon evaluation today patient appears to be doing more poorly in regard to his legs at this point. He actually is showing some signs of infection. This has been something that we seem to be back-and-forth with as far as trying to keep these wounds from becoming infected. He takes care of them very well in my opinion  but nonetheless I am concerned in this regard. He tells me that his wife is still doing really about the same she is slowly getting better. Nonetheless he still spends the majority of his time helping to take care of her. 11/02/2019 upon evaluation today patient actually appears to be doing somewhat better in regard to his legs. I do believe that the compounded antibiotic treatment from [facility] has been beneficial for  him. Overall I am extremely pleased with where things stand today. There is no signs of active infection at this time. The patient states he has much less drainage than he has in the past. Electronic Signature(s) Signed: 11/02/2019 1:34:47 PM By: Worthy Keeler PA-C Entered By: Worthy Keeler on 11/02/2019 13:34:47 -------------------------------------------------------------------------------- Physical Exam Details Patient Name: Date of Service: [name] 11/02/2019 12:30 PM Medical Record Number: [id number] Patient Account Number: [id number] Date of Birth/Sex: Treating RN: Dec 27, 1940 (79 y.o. [name] Primary Care Provider: [name] Other Clinician: Referring Provider: Treating Provider/Extender: [name] in Treatment: 24 Constitutional Well-nourished and well-hydrated in no acute distress. Respiratory normal breathing without difficulty. Psychiatric this patient is able to make decisions and demonstrates good insight into disease process. Alert and Oriented x 3. pleasant and cooperative. Notes Upon inspection patient's wounds again showed signs of slight improvement in my opinion. The does not appear to be any signs of active infection which is great news and overall very pleased. I think the decrease in drainage is indicative of improving overall infection status. The patient is very pleased. Electronic Signature(s) Signed: 11/02/2019 1:35:07 PM By: Worthy Keeler PA-C Entered By: Worthy Keeler on 11/02/2019 13:35:06 -------------------------------------------------------------------------------- Physician Orders Details Patient Name: Date of Service: [name] 11/02/2019 12:30 PM Medical Record Number: [id number] Patient Account Number: [id number] Date of Birth/Sex: Treating RN: 1940/04/09 (79 y.o. 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 Dressing Change Frequency Wound #17 Right,Medial Malleolus Change Dressing every other day. Wound #2 Left,Distal,Lateral Lower Leg Change Dressing every other day. Wound #5 Right,Proximal,Lateral Lower Leg Change Dressing every other day. Skin Barriers/Peri-Wound Care Barrier cream - to periwound areas Moisturizing lotion - both legs with dressing changes Wound Cleansing Wound #2 Left,Distal,Lateral Lower Leg May shower and wash wound with soap and water. - with dressing changes Wound #5 Right,Proximal,Lateral Lower Leg May shower and wash wound with soap and water. - with dressing changes Primary Wound Dressing Wound #17 Right,Medial Malleolus Santyl Ointment - saline moistened gauze over santyl Wound #2 Left,Distal,Lateral Lower Leg Collagen - fibracol - do not need to moisten Other: - spray wounds with antibiotic compound Wound #5 Right,Proximal,Lateral Lower Leg Collagen - fibracol - do not need to moisten Other: - spray wound bed with antibiotic compound Secondary Dressing Wound #17 Right,Medial Malleolus Dry Gauze Wound #2 Left,Distal,Lateral Lower Leg Kerlix/Rolled Gauze ABD pad - all wounds Zetuvit or Kerramax Wound #5 Right,Proximal,Lateral Lower Leg Kerlix/Rolled Gauze ABD pad - all  wounds Zetuvit or Kerramax Edema Control Avoid standing for long periods of time Elevate legs to the level of  the heart or above for 30 minutes daily and/or when sitting, a frequency of: - throughout the day Support Garment 20-30 mm/Hg pressure to: - patient to apply juxtalites daily to both legs over ace wraps daily Other: - ACE wrap both legs Electronic Signature(s) Signed: 11/02/2019 4:35:17 PM By: Worthy Keeler PA-C Signed: 11/02/2019 4:55:29 PM By: Baruch Gouty RN, BSN Entered By: Baruch Gouty on 11/02/2019 13:34:36 -------------------------------------------------------------------------------- Problem List Details Patient Name: Date of Service: [name], [place]. 11/02/2019 12:30 PM Medical Record Number: [id number] Patient Account Number: [id number] Date of Birth/Sex: Treating RN: January 23, 1940 (79 y.o. 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 09/30/2017 No Yes L97.322 Non-pressure chronic ulcer of left ankle with fat layer exposed 09/30/2017 No Yes L97.822 Non-pressure chronic ulcer of other part of left lower leg with fat layer exposed9/18/2019 No Yes L97.812 Non-pressure chronic ulcer of other part of right lower leg with fat layer 09/30/2017 No Yes exposed I10 Essential (primary) hypertension 09/30/2017 No Yes Inactive Problems Resolved Problems Electronic Signature(s) Signed: 11/02/2019 1:08:21 PM By: Worthy Keeler PA-C Entered By: Worthy Keeler on 11/02/2019 13:08:20 -------------------------------------------------------------------------------- Progress Note Details Patient Name: Date of Service: [name] 11/02/2019 12:30 PM Medical Record Number: [id number] Patient Account Number: [id number] Date of Birth/Sex: Treating RN: 1940/10/17 (79 y.o. [name] Primary Care Provider: [name] Other Clinician: Referring Provider: Treating Provider/Extender: [name] in Treatment: 109 Subjective Chief Complaint Information obtained from Patient Bilateral LE Ulcers History of Present Illness (HPI) 09/30/17 on evaluation today patient presents for initial evaluation and our clinic concerning issues that he has been having with his bilateral lower extremities. He states this has been going on for quite some time at least six months. Currently his regiment has been mainly cleaning the area with peroxide, applying the is foreign ointment, and wrapping the area with ABD pads and then an ace wrap loosely. He has dealt with issues of this nature he tells me for quite some time. He does have a history of having had a compound fracture of the left lower extremity which he thinks also makes this a much more difficult area for him to heal. He's previously been told that he had poor vascular flow but this was years ago at [facility] we do not have any of those records at this time. He has a history of Epidermolysis Bullosa which was diagnosed around age 46 and he has been cared for at [facility] since that time. Subsequently he states this is hereditary and two of his children one male and one male also have this as well is one of his grandchildren that he is aware of. He has no evidence of infection necessarily at this point although he does have some necrotic tissue noted on the surface of the wound as far as the largest, left lateral lower extremity ulcer, is concerned. Overall I feel like all things considered he's been taking care of this very well. Obviously he has some fairly significant issues going on at this point in this regard. He does have a history otherwise of hypertension though for the most part  other than the compound fracture of the left leg he seems to have been fairly healthy in my pinion. 10/07/17 on  evaluation today patient actually appears to be doing better in regard to his bilateral lower extremity ulcers. With that being said he does still have some evidence of slough noted on the surface of the wounds I think the Iodoflex has been beneficial for him. His arterial studies are scheduled for October 2. With that being said I do believe that he is continuing to show signs of good improvement which is at least good news. 10/14/17 on evaluation today patient appears to be doing very well in regard to his lower extremity ulcers. He's definitely made some progress as far as healing is concerned although there still are several open areas that are going to need to be addressed. He did have his arterial study today which fortunately shows good findings with a right ABI of 1.23 with a TBI of 0.86 in the left ABI of 1.28 with a TBI of 0.81. This is good news and will allow [place] to perform debridement as well. 10/23/2017; patient with a large wound on the left lateral calf, sizable area on the left medial malleolus and an area on the right lateral malleolus. He has a new blister consistent with his underlying blistering skin disease just above this area we have been using Iodoflex on the lateral left calf lateral right ankle and collagen on the medial left ankle. We have been using Kerlix Coban wraps 10/28/17 on evaluation today the patient continues to have signs of improvement in regard to the overall appearance of the original wound. Unfortunately he did have some blistering over the right lateral lower extremity which has appeared to rupture on evaluation today and likely some of the dead tissue on the surface needs to be cleaned away the good news is this does not appear to be to significantly deep at this time. 11/04/17 on evaluation today patient actually appears to be doing a little better in regard to his lower extremity ulcers. He has been tolerating the dressing changes without complication. With  that being said he does still have a significant wound especially over the left lateral lower extremity unfortunately. All of the wounds pretty much are going to require sharp debridement today. 11/11/17 on evaluation today patient appears to be doing more poorly in regard to his left lower extremity in particular. There does not appear to be any evidence of systemic infection although the wound itself as far as the larger left lateral lower extremity ulcer actually appears to be infected in my pinion. There's an older and the surface of the wound is dramatically worsened compared to last week. No fevers, chills, nausea, or vomiting noted at this time. 11/18/17 upon evaluation today patient actually appears to be doing better. I did review his culture today which really did not show any specific organism is a positive reason for his wound decline. There are multiple organisms present not predominant. Nonetheless he seems to be tolerate the doxycycline well in his wounds in general do seem to be doing better. Fortunately there does not appear to be any evidence of infection at this time which is good news. Overall I'm very pleased with how things appear. Nonetheless he still has a lot of healing to go. I do think he could benefit from a Juxta-Lite wrap. 11/25/17 on evaluation today patient actually appears to be doing fairly well in regard to his wounds. He is still taking the  antibiotics he has a few days left. Fortunately this seems to have been excellent for him as far as getting the infection control and very happy in this regard. With that being said the patient likewise is also very pleased with how things appear at this time in comparison to where we were he's not having as much pain. 12/02/17 Seen today for follow p and management of LLE wounds. Wounds appear to show some improvement. He denies pain, fever, or chills. Completed a course of doxycycline earlier this month. Scheduled to received  Juxta-Lite wrap this week. No s/s of infections. 12/09/17 on evaluation today patient actually appears to be doing a little bit better in regard to his wounds. This is obscene very slow process and unfortunately he has a couple of new areas and this is due to the Epidermolysis Bullosa. Nonetheless I am concerned about the fact that he seems to be getting more areas not less that is the reason we're gonna work on getting schedule for the vascular referral to see the venous specialist. 12/23/17 upon evaluation today patient's wounds currently shows evidence of still not doing quite as well is what I would like to have seen. Subsequently the patient did have his venous study which showed evidence of venous stasis. Subsequently I do think that a vascular evaluation for consideration of venous intervention would be appropriate. I'm not necessarily suggesting that will be anything that can be done but I think it is at least a good idea. He is in agreement with this plan. 12/30/17 on evaluation today patient actually appears to be doing very well in regard to his wounds when compared to previous evaluation. Subsequently we have been using the [facility] Dressing which actually appears to have done excellent on his left lateral lower extremity ulcer. The quality of the wound surface is dramatically improved. There is some slight debridement that is going to be required at a couple of locations but overall I'm extremely pleased with how things appear here. 01/07/2018; this is a patient with a primary skin disorder epidermolyis bullosa. Is a large wound on the left lateral calf and smaller wounds on the right however there is a new wound on the right mid tibia area that occurred within the compression wrap that he did not change. We have been using Hydrofera Blue. On the left he is using Hydrofera Blue and Santyl to the inferior part of the wound and changing the dressing himself. 01/15/2018; primary skin  disorder epidermolysis bullosa. He has several difficult wounds including the left lateral calf, smaller wounds on the left medial calf and the right lateral calf. The major area on the left lateral calf has a smaller area inferiorly that has necrotic debris we have been using Santyl to this. The rest of the wounds we have been using Hydrofera Blue. The area on the left calf actually looks larger this week. Uncontrolled edema several small open areas above it that are superficial 01/20/18 on evaluation today patient appears to be doing better as compared to last week in regard to his wounds of the bilateral lower extremities. He tolerated the bilateral compression wrap without complication. Overall I'm very pleased with how things appear at this time. The patient likewise is very happy. 01/27/18 on evaluation today patient appears to be doing decently well in regard to his bilateral lower extremity ulcers. He's been tolerating the dressing changes without complication. One issue he had is that he did have more drainage to the left leg wrapped  last week. He states in fact he probably should come in and let us change it on Friday however he just left it in place and kept adding extra absorption with ABD pads to the external portion of the wrap. Unfortunately he does have some aspiration type breakdown nothing significant but I do believe that this was probably counterproductive in general. Nonetheless his wounds do not appear to be terrible overall. 02/03/18 on evaluation today patient appears to be doing rather well in regard to his lower extremity ulcers. He has been tolerating the dressing changes without complication. He does tell me that he had to change the wrap on the left one since we last saw him. Subsequently I do not see any evidence of infection I do feel like the food was much better controlled at this point. 02/10/18 on evaluation today patient appears to be doing rather well in regard to his  ulcers. He still has significant alterations especially on the left lateral lower extremity. Fortunately there's no signs of infection at this time. Overall I feel like he is making good progress are some areas that I'm gonna attempt some debridement today. 02/17/18 on evaluation today patient appears to be doing okay in regard to his lower Trinity ulcer. It does appear on both locations he has a little bit of drainage causing some breakdown in maceration around the wound bed's although it doesn't appear to be too bad the right is a little bit worse than left. Fortunately there's no signs of infection which is good news. No fevers, chills, nausea, or vomiting noted at this time. 03/10/18 on evaluation today patient actually appears to be doing rather poorly in regard to his bilateral lower extremity ulcers. The right in particular is draining profusely and the wound is actually enlarging which is not good. I'm concerned about both possibly infection and the fact that there's a lot of moisture which is causing breakdown as well. Unfortunately the patient has been trying to change this at home I'm afraid he may need to change more frequently in order to see the improvement that were looking for. There's no signs of systemic infection. 03/17/18 patient actually appears to be doing significantly better at this point in regard to his bilateral lower extremity ulcers. Fortunately there's no signs of infection. That is worsening infection at least indefinitely nothing systemic. With that being said he is having a lot of drainage though not quite as much is there in his last evaluation. Overall feel like he's on the side of improvement. I think if his results back from his culture which showed that he had a positive group B strep along with abundant Pseudomonas noted on the culture. For that reason I am gonna have him continue with the linezolid as we previously have ordered for him and I did go ahead as well today  and prescribe Levaquin as well in order to treat the Pseudomonas portion of the infection noted. 03/24/18 on evaluation today patient actually appears to be doing very well in regard to his lower Trinity ulcer. He's been tolerating the dressing changes without complication. Fortunately both legs show signs of less drainage in his edema is very well controlled at this point as well. Overall very pleased with how things seem to be progressing. 03/31/18 on evaluation today patient actually appears to be doing excellent in regard to his bilateral lower extremity ulcers. These are not draining nearly as significant as what they were in the past overall seem to be shown signs  of excellent improvement which is great news. Fortunately there is no sign of active infection at this time I do believe that the Levaquin has done extremely well for him in this regard. The patient continues to change these at home typically every day. We may be able to slowly work towards every other day changes since the drainage seems to be slowing down quite significantly. 04/14/18 on evaluation today patient appears to be doing well in regard to his bilateral lower extremities. Let me [place] Almost completely healed which is excellent news. Fortunately he's shown signs of improvement all other sites as well with new skin growth there's some slight hyper granular tissue but for the most part this seems to be well maintained with the [facility] Dressing. I'm very happy in this regard. 04/28/18 on evaluation today patient appears to be doing rather well in regard to his ulcers of the bilateral lower extremities all things considering. He continues to make some progress as far as new skin growth. There still some hyper granulation noted at this point despite the use of the [facility] Dressing. This is not terrible but I think we may want to consider conclude cauterization today with silver nitrate to try to help knock some of  his back as well as helping with any biofilm on the surface of the wound. 05/12/18 on evaluation today patient's wounds actually appear to be doing fairly well in regard to the bilateral lower extremities. He's been tolerating the dressing changes without complication. Fortunately there's no signs of active infection at this time which is good news. Overall very pleased with how things seem to be progressing. You select silver nitrate was beneficial for him. 05/26/18 on evaluation today patient appears to be doing better in regard to left lower extremity and a little bit worse in regard to the right lower extremity. He states that he was pulling off the [facility] Dressing peel back some of the skin making this area significantly larger than what it was previous. He's not had any issues other than this and states even that hasn't caused any pain he just seems to obviously have a much larger area on the right when compared to the previous time I saw him. No fevers, chills, nausea, or vomiting noted at this time. 06/16/18 on evaluation today patient actually appears to be doing a little better in my pinion in regard to his lower summary ulcers. He has new skin islands that seem to be spreading which is good news. Fortunately there's no signs of active infection at this time. His biggest issue is he tells me that coming as often as he does is becoming very cost prohibitive. He wonders if we can potentially spread this out. 07/14/18 on evaluation today patient appears to be doing a little bit worse in regard to his lower from the ulcer. Unfortunately he still continues to have a significant amount of drainage I think we need to do something to try to help this more. He is still somewhat reluctant to go the route of the Wound VAC although that may be the most appropriate thing for him. No fevers, chills, nausea, or vomiting noted at this time. 08/18/2018 on evaluation today patient actually appears to be doing  quite well with regard to his bilateral lower extremity ulcers. I do feel like that currently he is making great progress the care max does seem to be doing a great job at helping to control the moisture he has no maceration or  skin breakdown. Again this seems to be an excellent way to go. 1 thing we may want to change is adding collagen to the base of the wound and then the care max over top he is not opposed to this. 09/15/2018 on evaluation today patient appears to be doing well with regard to his bilateral lower extremity ulcers. He is showing some signs of improvement not necessarily in size but definitely in appearance. In fact he has a lot of new skin growing throughout the wounds along the edges as well as in the central portion of the wounds on both lower extremities. Overall I am extremely pleased to see this. 10/20/2018 on evaluation today patient actually appears to be doing quite well with regard to his wounds. They are not measuring significantly smaller but he does have a lot of new epithelization noted as compared to previous. Fortunately there is no signs of active infection at this time. No fevers, chills, nausea, vomiting, or diarrhea. 11/17/2018 on evaluation today patient presents for reevaluation concerning his bilateral lower extremity ulcers. Fortunately there is no signs of active infection at this time today. He has been tolerating the dressing changes without complication. No fevers, chills, nausea, vomiting, or diarrhea. Unfortunately in general the patient has not made as much improvement as I would like to have seen up to this point. He has been tolerating the dressing changes without complication and he does an excellent job taking care of his wounds at home in my opinion. The biggest issue I see is that he is just not making the progress that we need to be seeing currently. I think we may want to consider having him seen at a plastic surgery appointment and he has previously  seen someone in years past at [facility] in [place]. That is definitely a possibility for [place] to look into at this point. 12/29/2018 on evaluation today patient appears to be doing better in regard to the overall visual appearance of his wounds which do not appear to be as macerated. He does have a much larger skin island in the middle of the left lower extremity ulcer which is doing much better. He tells me the pain is also significantly better. With that being said overall his improvement as far as the size of the wounds is not better but again these are very irregular in change shape quite often. Fortunately there is no evidence of active infection at this time which is great news. He never heard anything from [facility] regarding the plastic surgery referral that we made to them. 01/26/2019 upon evaluation today patient appears to be doing a little better in regard to his wounds today. He has been tolerating the dressing changes again he performs these for the most part on his own. He does a great job wrapping his legs in my opinion. Unfortunately he has not been able to get down to [facility] to see if there is anything from a plastic surgery standpoint that could be done to help with his legs simply due to the fact that his wife unfortunately sustained a compression fracture in her spine she is seeing Dr. Saintclair Halsted and subsequently is going to be having what sounds to be a kyphoplasty type procedure. With that being said that has not been scheduled yet there is still waiting on an MRI the patient is very busy in fact overly busy trying to help take care of his wife at this point. I completely understand this is more of a  strain on him at this time 02/23/2019 upon evaluation today patient actually appears to be making some progress. I am actually very pleased with the overall appearance of his wounds even compared to last evaluation. He seems to be doing quite well. He is taking care of  his wife unfortunately she did have a compression fracture she has had the procedure for this but still she has a slow road to recovery. For that reason he still not gone to [facility] for a second opinion in this regard. Obviously the goal there was if there was anything that can be done from a skin graft standpoint or otherwise. 03/23/2019 upon evaluation today patient continues to have issues with lower extremity ulcers. Since the beginning he has made progress but at the same time the wounds unfortunately just will not close. We have been trying to get the patient to [facility] to see a specialist there but unfortunately with the everything going on with his wife he has not been able to make that appointment time yet he states he may be able to in the next 1-2 months but is not really sure. 04/27/2019 on evaluation today patient appears to be doing a little bit more poorly. His last evaluation. He appears to have some erythema around the edges of the wound at this point. Fortunately there is no signs of active infection at this time which is good news. No fevers, chills, nausea, vomiting, or diarrhea. 06/08/2019 on evaluation today patient appears to be doing well with regard to his wounds. Overall they are actually measuring smaller compared to the last visit last month. We did treat him for Pseudomonas as well as methicillin-resistant Staph aureus. He was only on the treatment for MRSA however for 7 days as the Cipro was resistant and subsequently we had to place him on doxycycline. Nonetheless I am thinking that we may want to add the doxycycline and just do a month-long treatment considering the longstanding nature of his wounds and see if we get this under better control. 07/13/2019 upon evaluation today patient appears to be doing fairly well in regard to his bilateral lower extremities. There does not appear to be any signs of active infection which is good news. No fevers,  chills, nausea, vomiting, or diarrhea. 08/10/2019 upon evaluation today patient appears to be doing about the same with regard to his wounds in general. Unfortunately he is not significantly better although is also not significantly worse which is great news there is no evidence of active infection at this time which is good news. He still dealing with a lot going on with his wife and therefore is not really able to go see anyone at the specialty clinic at [facility] that we have previously set up still. 09/07/2019 on evaluation today patient appears to be doing well with regard to his wounds. In fact this is probably the best that have seen so far in quite a few months. Overall I am very pleased with where things stand at this time. No fevers, chills, nausea, vomiting, or diarrhea. 10/05/2019 upon evaluation today patient appears to be doing more poorly in regard to his legs at this point. He actually is showing some signs of infection. This has been something that we seem to be back-and-forth with as far as trying to keep these wounds from becoming infected. He takes care of them very well in my opinion but nonetheless I am concerned in this regard. He tells me that his wife  is still doing really about the same she is slowly getting better. Nonetheless he still spends the majority of his time helping to take care of her. 11/02/2019 upon evaluation today patient actually appears to be doing somewhat better in regard to his legs. I do believe that the compounded antibiotic treatment from [facility] has been beneficial for him. Overall I am extremely pleased with where things stand today. There is no signs of active infection at this time. The patient states he has much less drainage than he has in the past. Objective Constitutional Well-nourished and well-hydrated in no acute distress. Vitals Time Taken: 1:00 PM, Height: 71 in, Weight: 220 lbs, BMI: 30.7, Temperature: 98.1 F, Pulse: 72 bpm,  Respiratory Rate: 18 breaths/min, Blood Pressure: 143/72 mmHg. Respiratory normal breathing without difficulty. Psychiatric this patient is able to make decisions and demonstrates good insight into disease process. Alert and Oriented x 3. pleasant and cooperative. General Notes: Upon inspection patient's wounds again showed signs of slight improvement in my opinion. The does not appear to be any signs of active infection which is great news and overall very pleased. I think the decrease in drainage is indicative of improving overall infection status. The patient is very pleased. Integumentary (Hair, Skin) Wound #17 status is Open. Original cause of wound was Gradually Appeared. The wound is located on the Right,Medial Malleolus. The wound measures 1.9cm length x 1.9cm width x 0.4cm depth; 2.835cm^2 area and 1.134cm^3 volume. There is Fat Layer (Subcutaneous Tissue) exposed. There is no tunneling or undermining noted. There is a medium amount of serosanguineous drainage noted. The wound margin is well defined and not attached to the wound base. There is small (1-33%) pink granulation within the wound bed. There is a large (67-100%) amount of necrotic tissue within the wound bed including Adherent Slough. Wound #2 status is Open. Original cause of wound was Gradually Appeared. The wound is located on the Left,Distal,Lateral Lower Leg. The wound measures 17cm length x 14.7cm width x 0.2cm depth; 196.271cm^2 area and 39.254cm^3 volume. There is Fat Layer (Subcutaneous Tissue) exposed. There is no tunneling or undermining noted. There is a large amount of serosanguineous drainage noted. The wound margin is flat and intact. There is large (67-100%) red granulation within the wound bed. There is a small (1-33%) amount of necrotic tissue within the wound bed including Adherent Slough. Wound #5 status is Open. Original cause of wound was Gradually Appeared. The wound is located on the Right,Proximal,Lateral  Lower Leg. The wound measures 10.5cm length x 15.5cm width x 0.2cm depth; 127.824cm^2 area and 25.565cm^3 volume. There is Fat Layer (Subcutaneous Tissue) exposed. There is no tunneling or undermining noted. There is a large amount of serosanguineous drainage noted. The wound margin is flat and intact. There is large (67-100%) red granulation within the wound bed. There is no necrotic tissue within the wound bed. 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 There was a Chemical/Enzymatic/Mechanical debridement performed by Kela Millin, RN.Marland Kitchen Agent used was [employer]. A time out was conducted at 13:38, prior to the start of the procedure. There  was no bleeding. The procedure was tolerated well. Post Debridement Measurements: 1.9cm length x 1.9cm width x 0.4cm depth; 1.134cm^3 volume. Character of Wound/Ulcer Post Debridement requires further debridement. Severity of Tissue Post Debridement is: Fat layer exposed. Post procedure Diagnosis Wound #17: Same as Pre-Procedure Plan Follow-up Appointments: Return appointment in 1 month. Dressing Change Frequency: Wound #17 Right,Medial Malleolus: Change Dressing every other day. Wound #2 Left,Distal,Lateral Lower Leg: Change Dressing every other day. Wound #5 Right,Proximal,Lateral Lower Leg: Change Dressing every other day. Skin Barriers/Peri-Wound Care: Barrier cream - to periwound areas Moisturizing lotion - both legs with dressing changes Wound  Cleansing: Wound #2 Left,Distal,Lateral Lower Leg: May shower and wash wound with soap and water. - with dressing changes Wound #5 Right,Proximal,Lateral Lower Leg: May shower and wash wound with soap and water. - with dressing changes Primary Wound Dressing: Wound #17 Right,Medial Malleolus: Santyl Ointment - saline moistened gauze over santyl Wound #2 Left,Distal,Lateral Lower Leg: Collagen - fibracol - do not need to moisten Other: - spray wounds with antibiotic compound Wound #5 Right,Proximal,Lateral Lower Leg: Collagen - fibracol - do not need to moisten Other: - spray wound bed with antibiotic compound Secondary Dressing: Wound #17 Right,Medial Malleolus: Dry Gauze Wound #2 Left,Distal,Lateral Lower Leg: Kerlix/Rolled Gauze ABD pad - all wounds Zetuvit or Kerramax Wound #5 Right,Proximal,Lateral Lower Leg: Kerlix/Rolled Gauze ABD pad - all wounds Zetuvit or Kerramax Edema Control: Avoid standing for long periods of time Elevate legs to the level of the heart or above for 30 minutes daily and/or when sitting, a frequency of: - throughout the day Support Garment 20-30 mm/Hg pressure to: - patient to apply juxtalites daily to both legs over ace wraps daily Other: - ACE wrap both legs 1. I would recommend currently that we continue with the wound care measures as before and the patient is in agreement with the plan will continue to use the compounded antibiotic treatment topically from [facility] Coupled with this were also good use the collagen dressing as we have been doing followed by [employer]. 2. With regard to the compression use and continue with the ABD pads, care max, and then the rolled gauze to secure followed by utilization of his Velcro compression garments to keep edema under control. This seems to be doing well. 3. With regard to oral medications I do not feel like he needs any oral antibiotic treatment at this point. We will see patient back for  reevaluation in 4 weeks here in the clinic. If anything worsens or changes patient will contact our office for additional recommendations. Electronic Signature(s) Signed: 11/02/2019 4:35:17 PM By: Worthy Keeler PA-C Signed: 11/02/2019 4:55:29 PM By: Baruch Gouty RN, BSN Previous Signature: 11/02/2019 1:36:17 PM Version By: Worthy Keeler PA-C Entered By: Baruch Gouty on 11/02/2019 13:39:33 -------------------------------------------------------------------------------- SuperBill Details Patient Name: Date of Service: [name], GO RDO [name] 11/02/2019 Medical Record Number: [id number] Patient Account Number: [id number] Date of Birth/Sex: Treating RN: 04-Jan-1941 (79 y.o. [name] Primary Care Provider: [name] Other Clinician: Referring Provider: Treating Provider/Extender: [name] in Treatment: 109 Diagnosis Coding ICD-10 Codes Code Description I87.2 Venous insufficiency (chronic) (peripheral) Q81.8 Other epidermolysis bullosa L97.322 Non-pressure chronic ulcer of left ankle with fat layer exposed L97.822 Non-pressure chronic ulcer of other part of left lower leg with fat layer exposed L97.812 Non-pressure chronic ulcer of other part of right lower leg with fat layer exposed Riley (primary) hypertension Facility Procedures CPT4 Code: [id number] Description: 65784 - DEBRIDE  W/O ANES NON SELECT Modifier: Quantity: 1 Physician Procedures : CPT4 Code Description Modifier [id number] - WC PHYS LEVEL 4 - EST PT ICD-10 Diagnosis Description I87.2 Venous insufficiency (chronic) (peripheral) Q81.8 Other epidermolysis bullosa L97.322 Non-pressure chronic ulcer of left ankle with fat layer  exposed L97.822 Non-pressure chronic ulcer of other part of left lower leg with fat layer exposed Quantity: 1 Electronic Signature(s) Signed: 11/02/2019 4:35:17 PM By: Worthy Keeler PA-C Signed: 11/02/2019 4:55:29 PM By: Baruch Gouty RN,  BSN Previous Signature: 11/02/2019 1:36:46 PM Version By: Worthy Keeler PA-C Entered By: Baruch Gouty on 11/02/2019 13:39:19

## 2019-11-08 ENCOUNTER — Other Ambulatory Visit: Payer: Self-pay | Admitting: Internal Medicine

## 2019-11-10 DIAGNOSIS — I87312 Chronic venous hypertension (idiopathic) with ulcer of left lower extremity: Secondary | ICD-10-CM | POA: Diagnosis not present

## 2019-11-30 ENCOUNTER — Ambulatory Visit (HOSPITAL_BASED_OUTPATIENT_CLINIC_OR_DEPARTMENT_OTHER): Payer: Medicare PPO | Admitting: Physician Assistant

## 2019-12-06 ENCOUNTER — Encounter: Payer: Self-pay | Admitting: Internal Medicine

## 2019-12-06 DIAGNOSIS — H353132 Nonexudative age-related macular degeneration, bilateral, intermediate dry stage: Secondary | ICD-10-CM | POA: Diagnosis not present

## 2019-12-06 DIAGNOSIS — H04123 Dry eye syndrome of bilateral lacrimal glands: Secondary | ICD-10-CM | POA: Diagnosis not present

## 2019-12-06 DIAGNOSIS — Z961 Presence of intraocular lens: Secondary | ICD-10-CM | POA: Diagnosis not present

## 2019-12-06 DIAGNOSIS — H35033 Hypertensive retinopathy, bilateral: Secondary | ICD-10-CM | POA: Diagnosis not present

## 2019-12-07 ENCOUNTER — Encounter (HOSPITAL_BASED_OUTPATIENT_CLINIC_OR_DEPARTMENT_OTHER): Payer: Medicare PPO | Attending: Physician Assistant | Admitting: Physician Assistant

## 2019-12-07 ENCOUNTER — Other Ambulatory Visit: Payer: Self-pay

## 2019-12-07 DIAGNOSIS — L97312 Non-pressure chronic ulcer of right ankle with fat layer exposed: Secondary | ICD-10-CM | POA: Insufficient documentation

## 2019-12-07 DIAGNOSIS — L97322 Non-pressure chronic ulcer of left ankle with fat layer exposed: Secondary | ICD-10-CM | POA: Diagnosis not present

## 2019-12-07 DIAGNOSIS — I10 Essential (primary) hypertension: Secondary | ICD-10-CM | POA: Insufficient documentation

## 2019-12-07 DIAGNOSIS — L97812 Non-pressure chronic ulcer of other part of right lower leg with fat layer exposed: Secondary | ICD-10-CM | POA: Diagnosis not present

## 2019-12-07 DIAGNOSIS — I872 Venous insufficiency (chronic) (peripheral): Secondary | ICD-10-CM | POA: Diagnosis not present

## 2019-12-07 DIAGNOSIS — Q819 Epidermolysis bullosa, unspecified: Secondary | ICD-10-CM | POA: Insufficient documentation

## 2019-12-07 DIAGNOSIS — L97822 Non-pressure chronic ulcer of other part of left lower leg with fat layer exposed: Secondary | ICD-10-CM | POA: Diagnosis not present

## 2019-12-07 NOTE — Progress Notes (Addendum)
DOMONIC, KIMBALL ([id number]) Visit Report for 12/07/2019 Chief Complaint Document Details Patient Name: Date of Service: Carlos White 12/07/2019 12:45 PM Medical Record Number: [id number] Patient Account Number: [id number] Date of Birth/Sex: Treating RN: February 22, 1940 (79 y.o. [name] Primary Care Provider: [name] Other Clinician: Referring Provider: Treating Provider/Extender: [name] in Treatment: [place] from: Patient Chief Complaint Bilateral LE Ulcers Electronic Signature(s) Signed: 12/07/2019 1:25:05 PM By: Worthy Keeler PA-C Entered By: Worthy Keeler on 12/07/2019 13:25:05 -------------------------------------------------------------------------------- HPI Details Patient Name: Date of Service: Carlos White. 12/07/2019 12:45 PM Medical Record Number: [id number] Patient Account Number: [id number] Date of Birth/Sex: Treating RN: [birth year] (79 y.o. [name] Primary Care Provider: [name] Other Clinician: Referring Provider: Treating Provider/Extender: [name] in Treatment: 114 History of Present Illness HPI Description: 09/30/17 on evaluation today patient presents for initial evaluation and our clinic concerning issues that he has been having with his bilateral lower extremities. He states this has been going on for quite some time at least six months. Currently his regiment has been mainly cleaning the area with peroxide, applying the is foreign ointment, and wrapping the area with ABD pads and then an ace wrap loosely. He has dealt with issues of this nature he tells me for quite some time. He does have a history of having had a compound fracture of the left lower extremity which he thinks also makes this a much more difficult area for him to heal. He's previously been told that he had poor vascular flow but this was years ago at [facility] we do not have any of those  records at this time. He has a history of Epidermolysis Bullosa which was diagnosed around age 58 and he has been cared for at [facility] since that time. Subsequently he states this is hereditary and two of his children one male and one male also have this as well is one of his grandchildren that he is aware of. He has no evidence of infection necessarily at this point although he does have some necrotic tissue noted on the surface of the wound as far as the largest, left lateral lower extremity ulcer, is concerned. Overall I feel like all things considered he's been taking care of this very well. Obviously he has some fairly significant issues going on at this point in this regard. He does have a history otherwise of hypertension though for the most part other than the compound fracture of the left leg he seems to have been fairly healthy in my pinion. 10/07/17 on evaluation today patient actually appears to be doing better in regard to his bilateral lower extremity ulcers. With that being said he does still have some evidence of slough noted on the surface of the wounds I think the Iodoflex has been beneficial for him. His arterial studies are scheduled for October 2. With that being said I do believe that he is continuing to show signs of good improvement which is at least good news. 10/14/17 on evaluation today patient appears to be doing very well in regard to his lower extremity ulcers. He's definitely made some progress as far as healing is concerned although there still are several open areas that are going to need to be addressed. He did have his arterial study today which fortunately shows good findings with a right ABI of 1.23 with a TBI of 0.86 in the left ABI  of 1.28 with a TBI of 0.81. This is good news and will allow [place] to perform debridement as well. 10/23/2017; patient with a large wound on the left lateral calf, sizable area on the left medial malleolus and an area on the right lateral  malleolus. He has a new blister consistent with his underlying blistering skin disease just above this area we have been using Iodoflex on the lateral left calf lateral right ankle and collagen on the medial left ankle. We have been using Kerlix Coban wraps 10/28/17 on evaluation today the patient continues to have signs of improvement in regard to the overall appearance of the original wound. Unfortunately he did have some blistering over the right lateral lower extremity which has appeared to rupture on evaluation today and likely some of the dead tissue on the surface needs to be cleaned away the good news is this does not appear to be to significantly deep at this time. 11/04/17 on evaluation today patient actually appears to be doing a little better in regard to his lower extremity ulcers. He has been tolerating the dressing changes without complication. With that being said he does still have a significant wound especially over the left lateral lower extremity unfortunately. All of the wounds pretty much are going to require sharp debridement today. 11/11/17 on evaluation today patient appears to be doing more poorly in regard to his left lower extremity in particular. There does not appear to be any evidence of systemic infection although the wound itself as far as the larger left lateral lower extremity ulcer actually appears to be infected in my pinion. There's an older and the surface of the wound is dramatically worsened compared to last week. No fevers, chills, nausea, or vomiting noted at this time. 11/18/17 upon evaluation today patient actually appears to be doing better. I did review his culture today which really did not show any specific organism is a positive reason for his wound decline. There are multiple organisms present not predominant. Nonetheless he seems to be tolerate the doxycycline well in his wounds in general do seem to be doing better. Fortunately there does not appear  to be any evidence of infection at this time which is good news. Overall I'm very pleased with how things appear. Nonetheless he still has a lot of healing to go. I do think he could benefit from a Juxta-Lite wrap. 11/25/17 on evaluation today patient actually appears to be doing fairly well in regard to his wounds. He is still taking the antibiotics he has a few days left. Fortunately this seems to have been excellent for him as far as getting the infection control and very happy in this regard. With that being said the patient likewise is also very pleased with how things appear at this time in comparison to where we were he's not having as much pain. 12/02/17 Seen today for follow p and management of LLE wounds. Wounds appear to show some improvement. He denies pain, fever, or chills. Completed a course of doxycycline earlier this month. Scheduled to received Juxta-Lite wrap this week. No s/s of infections. 12/09/17 on evaluation today patient actually appears to be doing a little bit better in regard to his wounds. This is obscene very slow process and unfortunately he has a couple of new areas and this is due to the Epidermolysis Bullosa. Nonetheless I am concerned about the fact that he seems to be getting more areas not less that is the reason we're gonna work on  getting schedule for the vascular referral to see the venous specialist. 12/23/17 upon evaluation today patient's wounds currently shows evidence of still not doing quite as well is what I would like to have seen. Subsequently the patient did have his venous study which showed evidence of venous stasis. Subsequently I do think that a vascular evaluation for consideration of venous intervention would be appropriate. I'm not necessarily suggesting that will be anything that can be done but I think it is at least a good idea. He is in agreement with this plan. 12/30/17 on evaluation today patient actually appears to be doing very well in  regard to his wounds when compared to previous evaluation. Subsequently we have been using the [employer] Dressing which actually appears to have done excellent on his left lateral lower extremity ulcer. The quality of the wound surface is dramatically improved. There is some slight debridement that is going to be required at a couple of locations but overall I'm extremely pleased with how things appear here. 01/07/2018; this is a patient with a primary skin disorder epidermolyis bullosa. Is a large wound on the left lateral calf and smaller wounds on the right however there is a new wound on the right mid tibia area that occurred within the compression wrap that he did not change. We have been using Hydrofera Blue. On the left he is using Hydrofera Blue and Santyl to the inferior part of the wound and changing the dressing himself. 01/15/2018; primary skin disorder epidermolysis bullosa. He has several difficult wounds including the left lateral calf, smaller wounds on the left medial calf and the right lateral calf. The major area on the left lateral calf has a smaller area inferiorly that has necrotic debris we have been using Santyl to this. The rest of the wounds we have been using Hydrofera Blue. The area on the left calf actually looks larger this week. Uncontrolled edema several small open areas above it that are superficial 01/20/18 on evaluation today patient appears to be doing better as compared to last week in regard to his wounds of the bilateral lower extremities. He tolerated the bilateral compression wrap without complication. Overall I'm very pleased with how things appear at this time. The patient likewise is very happy. 01/27/18 on evaluation today patient appears to be doing decently well in regard to his bilateral lower extremity ulcers. He's been tolerating the dressing changes without complication. One issue he had is that he did have more drainage to the left leg wrapped last  week. He states in fact he probably should come in and let us change it on Friday however he just left it in place and kept adding extra absorption with ABD pads to the external portion of the wrap. Unfortunately he does have some aspiration type breakdown nothing significant but I do believe that this was probably counterproductive in general. Nonetheless his wounds do not appear to be terrible overall. 02/03/18 on evaluation today patient appears to be doing rather well in regard to his lower extremity ulcers. He has been tolerating the dressing changes without complication. He does tell me that he had to change the wrap on the left one since we last saw him. Subsequently I do not see any evidence of infection I do feel like the food was much better controlled at this point. 02/10/18 on evaluation today patient appears to be doing rather well in regard to his ulcers. He still has significant alterations especially on the left lateral lower extremity.  Fortunately there's no signs of infection at this time. Overall I feel like he is making good progress are some areas that I'm gonna attempt some debridement today. 02/17/18 on evaluation today patient appears to be doing okay in regard to his lower Trinity ulcer. It does appear on both locations he has a little bit of drainage causing some breakdown in maceration around the wound bed's although it doesn't appear to be too bad the right is a little bit worse than left. Fortunately there's no signs of infection which is good news. No fevers, chills, nausea, or vomiting noted at this time. 03/10/18 on evaluation today patient actually appears to be doing rather poorly in regard to his bilateral lower extremity ulcers. The right in particular is draining profusely and the wound is actually enlarging which is not good. I'm concerned about both possibly infection and the fact that there's a lot of moisture which is causing breakdown as well. Unfortunately the  patient has been trying to change this at home I'm afraid he may need to change more frequently in order to see the improvement that were looking for. There's no signs of systemic infection. 03/17/18 patient actually appears to be doing significantly better at this point in regard to his bilateral lower extremity ulcers. Fortunately there's no signs of infection. That is worsening infection at least indefinitely nothing systemic. With that being said he is having a lot of drainage though not quite as much is there in his last evaluation. Overall feel like he's on the side of improvement. I think if his results back from his culture which showed that he had a positive group B strep along with abundant Pseudomonas noted on the culture. For that reason I am gonna have him continue with the linezolid as we previously have ordered for him and I did go ahead as well today and prescribe Levaquin as well in order to treat the Pseudomonas portion of the infection noted. 03/24/18 on evaluation today patient actually appears to be doing very well in regard to his lower Trinity ulcer. He's been tolerating the dressing changes without complication. Fortunately both legs show signs of less drainage in his edema is very well controlled at this point as well. Overall very pleased with how things seem to be progressing. 03/31/18 on evaluation today patient actually appears to be doing excellent in regard to his bilateral lower extremity ulcers. These are not draining nearly as significant as what they were in the past overall seem to be shown signs of excellent improvement which is great news. Fortunately there is no sign of active infection at this time I do believe that the Levaquin has done extremely well for him in this regard. The patient continues to change these at home typically every day. We may be able to slowly work towards every other day changes since the drainage seems to be slowing down quite  significantly. 04/14/18 on evaluation today patient appears to be doing well in regard to his bilateral lower extremities. Let me Hilda Blades Almost completely healed which is excellent news. Fortunately he's shown signs of improvement all other sites as well with new skin growth there's some slight hyper granular tissue but for the most part this seems to be well maintained with the [facility] Dressing. I'm very happy in this regard. 04/28/18 on evaluation today patient appears to be doing rather well in regard to his ulcers of the bilateral lower extremities all things considering. He continues to make some progress as far  as new skin growth. There still some hyper granulation noted at this point despite the use of the [facility] Dressing. This is not terrible but I think we may want to consider conclude cauterization today with silver nitrate to try to help knock some of his back as well as helping with any biofilm on the surface of the wound. 05/12/18 on evaluation today patient's wounds actually appear to be doing fairly well in regard to the bilateral lower extremities. He's been tolerating the dressing changes without complication. Fortunately there's no signs of active infection at this time which is good news. Overall very pleased with how things seem to be progressing. You select silver nitrate was beneficial for him. 05/26/18 on evaluation today patient appears to be doing better in regard to left lower extremity and a little bit worse in regard to the right lower extremity. He states that he was pulling off the [facility] Dressing peel back some of the skin making this area significantly larger than what it was previous. He's not had any issues other than this and states even that hasn't caused any pain he just seems to obviously have a much larger area on the right when compared to the previous time I saw him. No fevers, chills, nausea, or vomiting noted at this time. 06/16/18 on  evaluation today patient actually appears to be doing a little better in my pinion in regard to his lower summary ulcers. He has new skin islands that seem to be spreading which is good news. Fortunately there's no signs of active infection at this time. His biggest issue is he tells me that coming as often as he does is becoming very cost prohibitive. He wonders if we can potentially spread this out. 07/14/18 on evaluation today patient appears to be doing a little bit worse in regard to his lower from the ulcer. Unfortunately he still continues to have a significant amount of drainage I think we need to do something to try to help this more. He is still somewhat reluctant to go the route of the Wound VAC although that may be the most appropriate thing for him. No fevers, chills, nausea, or vomiting noted at this time. 08/18/2018 on evaluation today patient actually appears to be doing quite well with regard to his bilateral lower extremity ulcers. I do feel like that currently he is making great progress the care max does seem to be doing a great job at helping to control the moisture he has no maceration or skin breakdown. Again this seems to be an excellent way to go. 1 thing we may want to change is adding collagen to the base of the wound and then the care max over top he is not opposed to this. 09/15/2018 on evaluation today patient appears to be doing well with regard to his bilateral lower extremity ulcers. He is showing some signs of improvement not necessarily in size but definitely in appearance. In fact he has a lot of new skin growing throughout the wounds along the edges as well as in the central portion of the wounds on both lower extremities. Overall I am extremely pleased to see this. 10/20/2018 on evaluation today patient actually appears to be doing quite well with regard to his wounds. They are not measuring significantly smaller but he does have a lot of new epithelization noted as  compared to previous. Fortunately there is no signs of active infection at this time. No fevers, chills, nausea, vomiting, or diarrhea. 11/17/2018  on evaluation today patient presents for reevaluation concerning his bilateral lower extremity ulcers. Fortunately there is no signs of active infection at this time today. He has been tolerating the dressing changes without complication. No fevers, chills, nausea, vomiting, or diarrhea. Unfortunately in general the patient has not made as much improvement as I would like to have seen up to this point. He has been tolerating the dressing changes without complication and he does an excellent job taking care of his wounds at home in my opinion. The biggest issue I see is that he is just not making the progress that we need to be seeing currently. I think we may want to consider having him seen at a plastic surgery appointment and he has previously seen someone in years past at [facility] in [place]. That is definitely a possibility for [place] to look into at this point. 12/29/2018 on evaluation today patient appears to be doing better in regard to the overall visual appearance of his wounds which do not appear to be as macerated. He does have a much larger skin island in the middle of the left lower extremity ulcer which is doing much better. He tells me the pain is also significantly better. With that being said overall his improvement as far as the size of the wounds is not better but again these are very irregular in change shape quite often. Fortunately there is no evidence of active infection at this time which is great news. He never heard anything from [facility] regarding the plastic surgery referral that we made to them. 01/26/2019 upon evaluation today patient appears to be doing a little better in regard to his wounds today. He has been tolerating the dressing changes again he performs these for the most part on his own. He does a great  job wrapping his legs in my opinion. Unfortunately he has not been able to get down to [facility] to see if there is anything from a plastic surgery standpoint that could be done to help with his legs simply due to the fact that his wife unfortunately sustained a compression fracture in her spine she is seeing Dr. Saintclair Halsted and subsequently is going to be having what sounds to be a kyphoplasty type procedure. With that being said that has not been scheduled yet there is still waiting on an MRI the patient is very busy in fact overly busy trying to help take care of his wife at this point. I completely understand this is more of a strain on him at this time 02/23/2019 upon evaluation today patient actually appears to be making some progress. I am actually very pleased with the overall appearance of his wounds even compared to last evaluation. He seems to be doing quite well. He is taking care of his wife unfortunately she did have a compression fracture she has had the procedure for this but still she has a slow road to recovery. For that reason he still not gone to [facility] for a second opinion in this regard. Obviously the goal there was if there was anything that can be done from a skin graft standpoint or otherwise. 03/23/2019 upon evaluation today patient continues to have issues with lower extremity ulcers. Since the beginning he has made progress but at the same time the wounds unfortunately just will not close. We have been trying to get the patient to [facility] to see a specialist there but unfortunately with the everything going on with his  wife he has not been able to make that appointment time yet he states he may be able to in the next 1-2 months but is not really sure. 04/27/2019 on evaluation today patient appears to be doing a little bit more poorly. His last evaluation. He appears to have some erythema around the edges of the wound at this point. Fortunately there is no  signs of active infection at this time which is good news. No fevers, chills, nausea, vomiting, or diarrhea. 06/08/2019 on evaluation today patient appears to be doing well with regard to his wounds. Overall they are actually measuring smaller compared to the last visit last month. We did treat him for Pseudomonas as well as methicillin-resistant Staph aureus. He was only on the treatment for MRSA however for 7 days as the Cipro was resistant and subsequently we had to place him on doxycycline. Nonetheless I am thinking that we may want to add the doxycycline and just do a month-long treatment considering the longstanding nature of his wounds and see if we get this under better control. 07/13/2019 upon evaluation today patient appears to be doing fairly well in regard to his bilateral lower extremities. There does not appear to be any signs of active infection which is good news. No fevers, chills, nausea, vomiting, or diarrhea. 08/10/2019 upon evaluation today patient appears to be doing about the same with regard to his wounds in general. Unfortunately he is not significantly better although is also not significantly worse which is great news there is no evidence of active infection at this time which is good news. He still dealing with a lot going on with his wife and therefore is not really able to go see anyone at the specialty clinic at [facility] that we have previously set up still. 09/07/2019 on evaluation today patient appears to be doing well with regard to his wounds. In fact this is probably the best that have seen so far in quite a few months. Overall I am very pleased with where things stand at this time. No fevers, chills, nausea, vomiting, or diarrhea. 10/05/2019 upon evaluation today patient appears to be doing more poorly in regard to his legs at this point. He actually is showing some signs of infection. This has been something that we seem to be back-and-forth with as far as trying to keep  these wounds from becoming infected. He takes care of them very well in my opinion but nonetheless I am concerned in this regard. He tells me that his wife is still doing really about the same she is slowly getting better. Nonetheless he still spends the majority of his time helping to take care of her. 11/02/2019 upon evaluation today patient actually appears to be doing somewhat better in regard to his legs. I do believe that the compounded antibiotic treatment from [facility] has been beneficial for him. Overall I am extremely pleased with where things stand today. There is no signs of active infection at this time. The patient states he has much less drainage than he has in the past. 12/07/2019 upon evaluation today patient appears to be doing well at this time in regard to the overall appearance of his wound bed. Currently there is no signs of active infection at this time. 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 Especially when  he stressed. Fortunately there is no signs of active infection locally or systemically at this point. Electronic Signature(s) Signed: 12/07/2019 1:52:05 PM By: Worthy Keeler PA-C Entered By: Worthy Keeler on 12/07/2019 13:52:04 -------------------------------------------------------------------------------- Physical Exam Details Patient Name: Date of Service: Carlos White 12/07/2019 12:45 PM Medical Record Number: [id number] Patient Account Number: [id number] Date of Birth/Sex: Treating RN: 03-01-1940 (79 y.o. [name] Primary Care Provider: [name] Other Clinician: Referring Provider: Treating Provider/Extender: [name] in Treatment: 32 Constitutional Well-nourished and well-hydrated in no acute distress. Respiratory normal breathing without  difficulty. Psychiatric this patient is able to make decisions and demonstrates good insight into disease process. Alert and Oriented x 3. pleasant and cooperative. Notes Upon inspection patient's wound bed actually showed signs of good granulation at this time. There does not appear to be any evidence of active infection which is great news. Overall very pleased at this time. No debridement was necessary today. Electronic Signature(s) Signed: 12/07/2019 1:52:23 PM By: Worthy Keeler PA-C Entered By: Worthy Keeler on 12/07/2019 13:52:22 -------------------------------------------------------------------------------- Physician Orders Details Patient Name: Date of Service: Carlos White 12/07/2019 12:45 PM Medical Record Number: [id number] Patient Account Number: [id number] Date of Birth/Sex: Treating RN: [birth year] (79 y.o. 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 Wound #2 Left,Distal,Lateral Lower Leg Change Dressing every other day. Wound #5 Right,Proximal,Lateral Lower Leg Change Dressing every other day. Skin Barriers/Peri-Wound Care Barrier cream - to periwound areas as needed Moisturizing lotion - both legs with dressing changes Wound  Cleansing Wound #2 Left,Distal,Lateral Lower Leg May shower and wash wound with soap and water. - with dressing changes Wound #5 Right,Proximal,Lateral Lower Leg May shower and wash wound with soap and water. - with dressing changes Primary Wound Dressing Wound #17 Right,Medial Malleolus Collagen - fibracol Other: - keystone antibiotic compound to wound bed Wound #2 Left,Distal,Lateral Lower Leg Collagen - fibracol - do not need to moisten Other: - keystone antibiotic compound to wound bed Wound #5 Right,Proximal,Lateral Lower Leg Collagen - fibracol - do not need to moisten Other: - keystone antibiotic compound to wound bed Secondary Dressing Wound #17 Right,Medial Malleolus Dry Gauze Wound #2 Left,Distal,Lateral Lower Leg Kerlix/Rolled Gauze ABD pad - all wounds Zetuvit or Kerramax Wound #5 Right,Proximal,Lateral Lower Leg Kerlix/Rolled Gauze ABD pad - all wounds Zetuvit or Kerramax Edema Control Avoid standing for long periods of time Elevate legs to the level of the heart or above for 30 minutes daily and/or when sitting, a frequency of: - throughout the day Support Garment 20-30 mm/Hg pressure to: - patient to apply juxtalites daily to both legs over ace wraps daily Other: - ACE wrap both legs Electronic Signature(s) Signed: 12/07/2019 4:58:27 PM By: Baruch Gouty RN, BSN Signed: 12/07/2019 5:04:05 PM By: Worthy Keeler PA-C Entered By: Baruch Gouty on 12/07/2019 13:52:25 -------------------------------------------------------------------------------- Problem List Details Patient  Name: Date of Service: Carlos White 12/07/2019 12:45 PM Medical Record Number: [id number] Patient Account Number: [id number] Date of Birth/Sex: Treating RN: Oct 03, 1940 (79 y.o. 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 09/30/2017 No Yes L97.322 Non-pressure chronic ulcer of left ankle with fat layer exposed 09/30/2017 No Yes L97.822 Non-pressure chronic ulcer of other part of left lower leg with fat layer exposed9/18/2019 No Yes L97.812 Non-pressure chronic ulcer of other part of right lower leg with fat layer 09/30/2017 No Yes exposed I10 Essential (primary) hypertension 09/30/2017 No Yes Inactive Problems Resolved Problems Electronic Signature(s) Signed: 12/07/2019 1:24:59 PM By: Worthy Keeler PA-C Entered By: Worthy Keeler on 12/07/2019 13:24:59 -------------------------------------------------------------------------------- Progress Note Details Patient Name: Date of Service: Carlos White. 12/07/2019 12:45 PM Medical Record Number: [id number] Patient Account Number: [id number] Date of Birth/Sex: Treating RN: [birth year] (79 y.o. [name] Primary Care Provider: [name] Other Clinician: Referring Provider: Treating Provider/Extender: [name] in Treatment: [phone] Subjective Chief Complaint Information obtained from Patient Bilateral LE Ulcers History of Present Illness (HPI) 09/30/17 on evaluation today patient presents for initial evaluation and our clinic concerning issues that he has been having with his bilateral lower extremities. He states this has been going on for quite some time at least six months. Currently his regiment has been mainly cleaning the area with peroxide, applying the is foreign ointment, and wrapping the area with ABD pads and then an ace wrap loosely. He has dealt with issues of this nature he tells me for quite some time. He does have a history of having had a compound fracture of the left lower extremity which he thinks also makes this a much more difficult  area for him to heal. He's previously been told that he had poor vascular flow but this was years ago at [facility] we do not have any of those records at this time. He has a history of Epidermolysis Bullosa which was diagnosed around age 70 and he has been cared for at [facility] since that time. Subsequently he states this is hereditary and two of his children one male and one male also have this as well is one of his grandchildren that he is aware of. He has no evidence of infection necessarily at this point although he does have some necrotic tissue noted on the surface of the wound as far as the largest, left lateral lower extremity ulcer, is concerned. Overall I feel like all things considered he's been taking care of this very well. Obviously he has some fairly significant issues going on at this point in this regard. He does have a history otherwise of hypertension though for the most part other than the compound fracture of the left leg he seems to have been fairly healthy in my pinion. 10/07/17 on evaluation today patient actually appears to be doing better in regard to his bilateral lower extremity ulcers. With that being said he does still have some evidence of slough noted on the surface of the wounds I think the Iodoflex has been beneficial for him. His arterial studies are scheduled for October 2. With that being said I do believe that he is continuing to show signs of good improvement which is at  least good news. 10/14/17 on evaluation today patient appears to be doing very well in regard to his lower extremity ulcers. He's definitely made some progress as far as healing is concerned although there still are several open areas that are going to need to be addressed. He did have his arterial study today which fortunately shows good findings with a right ABI of 1.23 with a TBI of 0.86 in the left ABI of 1.28 with a TBI of 0.81. This is good news and will allow [place] to perform debridement  as well. 10/23/2017; patient with a large wound on the left lateral calf, sizable area on the left medial malleolus and an area on the right lateral malleolus. He has a new blister consistent with his underlying blistering skin disease just above this area we have been using Iodoflex on the lateral left calf lateral right ankle and collagen on the medial left ankle. We have been using Kerlix Coban wraps 10/28/17 on evaluation today the patient continues to have signs of improvement in regard to the overall appearance of the original wound. Unfortunately he did have some blistering over the right lateral lower extremity which has appeared to rupture on evaluation today and likely some of the dead tissue on the surface needs to be cleaned away the good news is this does not appear to be to significantly deep at this time. 11/04/17 on evaluation today patient actually appears to be doing a little better in regard to his lower extremity ulcers. He has been tolerating the dressing changes without complication. With that being said he does still have a significant wound especially over the left lateral lower extremity unfortunately. All of the wounds pretty much are going to require sharp debridement today. 11/11/17 on evaluation today patient appears to be doing more poorly in regard to his left lower extremity in particular. There does not appear to be any evidence of systemic infection although the wound itself as far as the larger left lateral lower extremity ulcer actually appears to be infected in my pinion. There's an older and the surface of the wound is dramatically worsened compared to last week. No fevers, chills, nausea, or vomiting noted at this time. 11/18/17 upon evaluation today patient actually appears to be doing better. I did review his culture today which really did not show any specific organism is a positive reason for his wound decline. There are multiple organisms present not  predominant. Nonetheless he seems to be tolerate the doxycycline well in his wounds in general do seem to be doing better. Fortunately there does not appear to be any evidence of infection at this time which is good news. Overall I'm very pleased with how things appear. Nonetheless he still has a lot of healing to go. I do think he could benefit from a Juxta-Lite wrap. 11/25/17 on evaluation today patient actually appears to be doing fairly well in regard to his wounds. He is still taking the antibiotics he has a few days left. Fortunately this seems to have been excellent for him as far as getting the infection control and very happy in this regard. With that being said the patient likewise is also very pleased with how things appear at this time in comparison to where we were he's not having as much pain. 12/02/17 Seen today for follow p and management of LLE wounds. Wounds appear to show some improvement. He denies pain, fever, or chills. Completed a course of doxycycline earlier this month. Scheduled to received Juxta-Lite  wrap this week. No s/s of infections. 12/09/17 on evaluation today patient actually appears to be doing a little bit better in regard to his wounds. This is obscene very slow process and unfortunately he has a couple of new areas and this is due to the Epidermolysis Bullosa. Nonetheless I am concerned about the fact that he seems to be getting more areas not less that is the reason we're gonna work on getting schedule for the vascular referral to see the venous specialist. 12/23/17 upon evaluation today patient's wounds currently shows evidence of still not doing quite as well is what I would like to have seen. Subsequently the patient did have his venous study which showed evidence of venous stasis. Subsequently I do think that a vascular evaluation for consideration of venous intervention would be appropriate. I'm not necessarily suggesting that will be anything that can be done  but I think it is at least a good idea. He is in agreement with this plan. 12/30/17 on evaluation today patient actually appears to be doing very well in regard to his wounds when compared to previous evaluation. Subsequently we have been using the [facility] Dressing which actually appears to have done excellent on his left lateral lower extremity ulcer. The quality of the wound surface is dramatically improved. There is some slight debridement that is going to be required at a couple of locations but overall I'm extremely pleased with how things appear here. 01/07/2018; this is a patient with a primary skin disorder epidermolyis bullosa. Is a large wound on the left lateral calf and smaller wounds on the right however there is a new wound on the right mid tibia area that occurred within the compression wrap that he did not change. We have been using Hydrofera Blue. On the left he is using Hydrofera Blue and Santyl to the inferior part of the wound and changing the dressing himself. 01/15/2018; primary skin disorder epidermolysis bullosa. He has several difficult wounds including the left lateral calf, smaller wounds on the left medial calf and the right lateral calf. The major area on the left lateral calf has a smaller area inferiorly that has necrotic debris we have been using Santyl to this. The rest of the wounds we have been using Hydrofera Blue. The area on the left calf actually looks larger this week. Uncontrolled edema several small open areas above it that are superficial 01/20/18 on evaluation today patient appears to be doing better as compared to last week in regard to his wounds of the bilateral lower extremities. He tolerated the bilateral compression wrap without complication. Overall I'm very pleased with how things appear at this time. The patient likewise is very happy. 01/27/18 on evaluation today patient appears to be doing decently well in regard to his bilateral lower extremity  ulcers. He's been tolerating the dressing changes without complication. One issue he had is that he did have more drainage to the left leg wrapped last week. He states in fact he probably should come in and let us change it on Friday however he just left it in place and kept adding extra absorption with ABD pads to the external portion of the wrap. Unfortunately he does have some aspiration type breakdown nothing significant but I do believe that this was probably counterproductive in general. Nonetheless his wounds do not appear to be terrible overall. 02/03/18 on evaluation today patient appears to be doing rather well in regard to his lower extremity ulcers. He has been tolerating the  dressing changes without complication. He does tell me that he had to change the wrap on the left one since we last saw him. Subsequently I do not see any evidence of infection I do feel like the food was much better controlled at this point. 02/10/18 on evaluation today patient appears to be doing rather well in regard to his ulcers. He still has significant alterations especially on the left lateral lower extremity. Fortunately there's no signs of infection at this time. Overall I feel like he is making good progress are some areas that I'm gonna attempt some debridement today. 02/17/18 on evaluation today patient appears to be doing okay in regard to his lower Trinity ulcer. It does appear on both locations he has a little bit of drainage causing some breakdown in maceration around the wound bed's although it doesn't appear to be too bad the right is a little bit worse than left. Fortunately there's no signs of infection which is good news. No fevers, chills, nausea, or vomiting noted at this time. 03/10/18 on evaluation today patient actually appears to be doing rather poorly in regard to his bilateral lower extremity ulcers. The right in particular is draining profusely and the wound is actually enlarging which is not  good. I'm concerned about both possibly infection and the fact that there's a lot of moisture which is causing breakdown as well. Unfortunately the patient has been trying to change this at home I'm afraid he may need to change more frequently in order to see the improvement that were looking for. There's no signs of systemic infection. 03/17/18 patient actually appears to be doing significantly better at this point in regard to his bilateral lower extremity ulcers. Fortunately there's no signs of infection. That is worsening infection at least indefinitely nothing systemic. With that being said he is having a lot of drainage though not quite as much is there in his last evaluation. Overall feel like he's on the side of improvement. I think if his results back from his culture which showed that he had a positive group B strep along with abundant Pseudomonas noted on the culture. For that reason I am gonna have him continue with the linezolid as we previously have ordered for him and I did go ahead as well today and prescribe Levaquin as well in order to treat the Pseudomonas portion of the infection noted. 03/24/18 on evaluation today patient actually appears to be doing very well in regard to his lower Trinity ulcer. He's been tolerating the dressing changes without complication. Fortunately both legs show signs of less drainage in his edema is very well controlled at this point as well. Overall very pleased with how things seem to be progressing. 03/31/18 on evaluation today patient actually appears to be doing excellent in regard to his bilateral lower extremity ulcers. These are not draining nearly as significant as what they were in the past overall seem to be shown signs of excellent improvement which is great news. Fortunately there is no sign of active infection at this time I do believe that the Levaquin has done extremely well for him in this regard. The patient continues to change these at home  typically every day. We may be able to slowly work towards every other day changes since the drainage seems to be slowing down quite significantly. 04/14/18 on evaluation today patient appears to be doing well in regard to his bilateral lower extremities. Let me [facility] Almost completely healed which is excellent news. Fortunately  he's shown signs of improvement all other sites as well with new skin growth there's some slight hyper granular tissue but for the most part this seems to be well maintained with the [facility] Dressing. I'm very happy in this regard. 04/28/18 on evaluation today patient appears to be doing rather well in regard to his ulcers of the bilateral lower extremities all things considering. He continues to make some progress as far as new skin growth. There still some hyper granulation noted at this point despite the use of the [facility] Dressing. This is not terrible but I think we may want to consider conclude cauterization today with silver nitrate to try to help knock some of his back as well as helping with any biofilm on the surface of the wound. 05/12/18 on evaluation today patient's wounds actually appear to be doing fairly well in regard to the bilateral lower extremities. He's been tolerating the dressing changes without complication. Fortunately there's no signs of active infection at this time which is good news. Overall very pleased with how things seem to be progressing. You select silver nitrate was beneficial for him. 05/26/18 on evaluation today patient appears to be doing better in regard to left lower extremity and a little bit worse in regard to the right lower extremity. He states that he was pulling off the [facility] Dressing peel back some of the skin making this area significantly larger than what it was previous. He's not had any issues other than this and states even that hasn't caused any pain he just seems to obviously have a much larger area  on the right when compared to the previous time I saw him. No fevers, chills, nausea, or vomiting noted at this time. 06/16/18 on evaluation today patient actually appears to be doing a little better in my pinion in regard to his lower summary ulcers. He has new skin islands that seem to be spreading which is good news. Fortunately there's no signs of active infection at this time. His biggest issue is he tells me that coming as often as he does is becoming very cost prohibitive. He wonders if we can potentially spread this out. 07/14/18 on evaluation today patient appears to be doing a little bit worse in regard to his lower from the ulcer. Unfortunately he still continues to have a significant amount of drainage I think we need to do something to try to help this more. He is still somewhat reluctant to go the route of the Wound VAC although that may be the most appropriate thing for him. No fevers, chills, nausea, or vomiting noted at this time. 08/18/2018 on evaluation today patient actually appears to be doing quite well with regard to his bilateral lower extremity ulcers. I do feel like that currently he is making great progress the care max does seem to be doing a great job at helping to control the moisture he has no maceration or skin breakdown. Again this seems to be an excellent way to go. 1 thing we may want to change is adding collagen to the base of the wound and then the care max over top he is not opposed to this. 09/15/2018 on evaluation today patient appears to be doing well with regard to his bilateral lower extremity ulcers. He is showing some signs of improvement not necessarily in size but definitely in appearance. In fact he has a lot of new skin growing throughout the wounds along the edges as well as in the  central portion of the wounds on both lower extremities. Overall I am extremely pleased to see this. 10/20/2018 on evaluation today patient actually appears to be doing quite well  with regard to his wounds. They are not measuring significantly smaller but he does have a lot of new epithelization noted as compared to previous. Fortunately there is no signs of active infection at this time. No fevers, chills, nausea, vomiting, or diarrhea. 11/17/2018 on evaluation today patient presents for reevaluation concerning his bilateral lower extremity ulcers. Fortunately there is no signs of active infection at this time today. He has been tolerating the dressing changes without complication. No fevers, chills, nausea, vomiting, or diarrhea. Unfortunately in general the patient has not made as much improvement as I would like to have seen up to this point. He has been tolerating the dressing changes without complication and he does an excellent job taking care of his wounds at home in my opinion. The biggest issue I see is that he is just not making the progress that we need to be seeing currently. I think we may want to consider having him seen at a plastic surgery appointment and he has previously seen someone in years past at [facility] in [place]. That is definitely a possibility for [place] to look into at this point. 12/29/2018 on evaluation today patient appears to be doing better in regard to the overall visual appearance of his wounds which do not appear to be as macerated. He does have a much larger skin island in the middle of the left lower extremity ulcer which is doing much better. He tells me the pain is also significantly better. With that being said overall his improvement as far as the size of the wounds is not better but again these are very irregular in change shape quite often. Fortunately there is no evidence of active infection at this time which is great news. He never heard anything from [facility] regarding the plastic surgery referral that we made to them. 01/26/2019 upon evaluation today patient appears to be doing a little better in regard to his  wounds today. He has been tolerating the dressing changes again he performs these for the most part on his own. He does a great job wrapping his legs in my opinion. Unfortunately he has not been able to get down to [facility] to see if there is anything from a plastic surgery standpoint that could be done to help with his legs simply due to the fact that his wife unfortunately sustained a compression fracture in her spine she is seeing Dr. Saintclair Halsted and subsequently is going to be having what sounds to be a kyphoplasty type procedure. With that being said that has not been scheduled yet there is still waiting on an MRI the patient is very busy in fact overly busy trying to help take care of his wife at this point. I completely understand this is more of a strain on him at this time 02/23/2019 upon evaluation today patient actually appears to be making some progress. I am actually very pleased with the overall appearance of his wounds even compared to last evaluation. He seems to be doing quite well. He is taking care of his wife unfortunately she did have a compression fracture she has had the procedure for this but still she has a slow road to recovery. For that reason he still not gone to [facility] for a second opinion in this regard. Obviously the goal there was if  there was anything that can be done from a skin graft standpoint or otherwise. 03/23/2019 upon evaluation today patient continues to have issues with lower extremity ulcers. Since the beginning he has made progress but at the same time the wounds unfortunately just will not close. We have been trying to get the patient to [facility] to see a specialist there but unfortunately with the everything going on with his wife he has not been able to make that appointment time yet he states he may be able to in the next 1-2 months but is not really sure. 04/27/2019 on evaluation today patient appears to be doing a little bit more  poorly. His last evaluation. He appears to have some erythema around the edges of the wound at this point. Fortunately there is no signs of active infection at this time which is good news. No fevers, chills, nausea, vomiting, or diarrhea. 06/08/2019 on evaluation today patient appears to be doing well with regard to his wounds. Overall they are actually measuring smaller compared to the last visit last month. We did treat him for Pseudomonas as well as methicillin-resistant Staph aureus. He was only on the treatment for MRSA however for 7 days as the Cipro was resistant and subsequently we had to place him on doxycycline. Nonetheless I am thinking that we may want to add the doxycycline and just do a month-long treatment considering the longstanding nature of his wounds and see if we get this under better control. 07/13/2019 upon evaluation today patient appears to be doing fairly well in regard to his bilateral lower extremities. There does not appear to be any signs of active infection which is good news. No fevers, chills, nausea, vomiting, or diarrhea. 08/10/2019 upon evaluation today patient appears to be doing about the same with regard to his wounds in general. Unfortunately he is not significantly better although is also not significantly worse which is great news there is no evidence of active infection at this time which is good news. He still dealing with a lot going on with his wife and therefore is not really able to go see anyone at the specialty clinic at [facility] that we have previously set up still. 09/07/2019 on evaluation today patient appears to be doing well with regard to his wounds. In fact this is probably the best that have seen so far in quite a few months. Overall I am very pleased with where things stand at this time. No fevers, chills, nausea, vomiting, or diarrhea. 10/05/2019 upon evaluation today patient appears to be doing more poorly in regard to his legs at this point. He  actually is showing some signs of infection. This has been something that we seem to be back-and-forth with as far as trying to keep these wounds from becoming infected. He takes care of them very well in my opinion but nonetheless I am concerned in this regard. He tells me that his wife is still doing really about the same she is slowly getting better. Nonetheless he still spends the majority of his time helping to take care of her. 11/02/2019 upon evaluation today patient actually appears to be doing somewhat better in regard to his legs. I do believe that the compounded antibiotic treatment from [facility] has been beneficial for him. Overall I am extremely pleased with where things stand today. There is no signs of active infection at this time. The patient states he has much less drainage than he has in the past.  12/07/2019 upon evaluation today patient appears to be doing well at this time in regard to the overall appearance of his wound bed. Currently there is no signs of active infection at this time. 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 Especially when he stressed. Fortunately there is no signs of active infection locally or systemically at this point. Objective Constitutional Well-nourished and well-hydrated in no acute distress. Vitals Time Taken: 1:06 PM, Height: 71 in, Weight: 220 lbs, BMI: 30.7, Temperature: 97.5 F, Pulse: 69 bpm, Respiratory Rate: 20 breaths/min, Blood Pressure: 152/81 mmHg. Respiratory normal breathing without difficulty. Psychiatric this patient is able to make decisions and demonstrates good insight into disease process. Alert and Oriented x 3. pleasant and cooperative. General Notes: Upon inspection patient's wound bed actually showed signs of good granulation at this time. There does not  appear to be any evidence of active infection which is great news. Overall very pleased at this time. No debridement was necessary today. Integumentary (Hair, Skin) Wound #17 status is Open. Original cause of wound was Gradually Appeared. The wound is located on the Right,Medial Malleolus. The wound measures 2cm length x 2.1cm width x 0.2cm depth; 3.299cm^2 area and 0.66cm^3 volume. There is Fat Layer (Subcutaneous Tissue) exposed. There is no tunneling or undermining noted. There is a medium amount of serosanguineous drainage noted. The wound margin is well defined and not attached to the wound base. There is small (1-33%) pink granulation within the wound bed. There is a large (67-100%) amount of necrotic tissue within the wound bed including Adherent Slough. Wound #2 status is Open. Original cause of wound was Gradually Appeared. The wound is located on the Left,Distal,Lateral Lower Leg. The wound measures 13.5cm length x 17cm width x 0.1cm depth; 180.249cm^2 area and 18.025cm^3 volume. There is Fat Layer (Subcutaneous Tissue) exposed. There is no tunneling or undermining noted. There is a large amount of serosanguineous drainage noted. The wound margin is flat and intact. There is large (67-100%) red granulation within the wound bed. There is a small (1-33%) amount of necrotic tissue within the wound bed including Adherent Slough. Wound #5 status is Open. Original cause of wound was Gradually Appeared. The wound is located on the Right,Proximal,Lateral Lower Leg. The wound measures 11cm length x 16cm width x 0.1cm depth; 138.23cm^2 area and 13.823cm^3 volume. There is Fat Layer (Subcutaneous Tissue) exposed. There is no tunneling or undermining noted. There is a large amount of serosanguineous drainage noted. The wound margin is flat and intact. There is large (67-100%) red granulation within the wound bed. There is no necrotic tissue within the wound bed. 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 Wound #2 Left,Distal,Lateral Lower Leg: Change Dressing every other day. Wound #5 Right,Proximal,Lateral Lower Leg: Change Dressing every other day. Skin Barriers/Peri-Wound Care: Barrier cream - to periwound areas as needed Moisturizing lotion - both legs with dressing changes Wound Cleansing: Wound #2 Left,Distal,Lateral Lower Leg: May shower and  wash wound with soap and water. - with dressing changes Wound #5 Right,Proximal,Lateral Lower Leg: May shower and wash wound with soap and water. - with dressing changes Primary Wound Dressing: Wound #17 Right,Medial Malleolus: Collagen - fibracol Other: - keystone antibiotic compound to wound bed Wound #2 Left,Distal,Lateral Lower Leg: Collagen - fibracol - do not need to moisten Other: - keystone antibiotic compound to wound bed Wound #5 Right,Proximal,Lateral Lower Leg: Collagen - fibracol - do not need to moisten Other: - keystone antibiotic compound to wound bed Secondary Dressing: Wound #17 Right,Medial Malleolus: Dry Gauze Wound #2 Left,Distal,Lateral Lower Leg: Kerlix/Rolled Gauze ABD pad - all wounds Zetuvit or Kerramax Wound #5 Right,Proximal,Lateral Lower Leg: Kerlix/Rolled Gauze ABD pad - all wounds Zetuvit or Kerramax Edema Control: Avoid standing for long periods of time Elevate legs to the level of the heart or above for 30 minutes daily and/or when sitting, a frequency of: - throughout the day Support Garment  20-30 mm/Hg pressure to: - patient to apply juxtalites daily to both legs over ace wraps daily Other: - ACE wrap both legs 1. Would recommend currently that we go and continue with the wound care measures as before specifically with regard to the [place] medication we will get a put that on her rather he will when he gets home since he forgot to bring it. He will also put his dressings on what is can put a dry dressing on to get in until he gets home. 2. I am also can recommend he use the barrier cream around the wound so that it does not cause any issues with any new areas of breakdown. 3. I am also can recommend the patient continue to elevate his legs and use compression through his juxta lite compression garments as directed. We will see patient back for reevaluation in 6 weeks here in the clinic. If anything worsens or changes patient will contact our office for additional recommendations. Electronic Signature(s) Signed: 12/07/2019 1:53:20 PM By: Worthy Keeler PA-C Entered By: Worthy Keeler on 12/07/2019 13:53:20 -------------------------------------------------------------------------------- SuperBill Details Patient Name: Date of Service: Carlos White, GO RDO Ovidio White 12/07/2019 Medical Record Number: [id number] Patient Account Number: [id number] Date of Birth/Sex: Treating RN: 06-20-1940 (79 y.o. [name] Primary Care Provider: [name] Other Clinician: Referring Provider: Treating Provider/Extender: [name] in Treatment: 114 Diagnosis Coding ICD-10 Codes Code Description I87.2 Venous insufficiency (chronic) (peripheral) Q81.8 Other epidermolysis bullosa L97.322 Non-pressure chronic ulcer of left ankle with fat layer exposed L97.822 Non-pressure chronic ulcer of other part of left lower leg with fat layer exposed L97.812 Non-pressure chronic ulcer of other part of right lower leg with fat layer exposed [place] (primary)  hypertension Facility Procedures CPT4 Code: [id number] Description: 62563 - WOUND CARE VISIT-LEV 5 EST PT Modifier: Quantity: 1 Physician Procedures : CPT4 Code Description Modifier [id number] - WC PHYS LEVEL 3 - EST PT 1 ICD-10 Diagnosis Description I87.2 Venous insufficiency (chronic) (peripheral) Q81.8 Other epidermolysis bullosa L97.322 Non-pressure chronic ulcer of left ankle with fat layer  exposed L97.822 Non-pressure chronic ulcer of other part of left lower leg with fat layer exposed Quantity: Electronic Signature(s) Signed: 12/07/2019 1:53:37 PM By: Worthy Keeler PA-C Entered By: Worthy Keeler on 12/07/2019 13:53:37

## 2019-12-07 NOTE — Progress Notes (Signed)
Carlos White ([id number]) Visit Report for 12/07/2019 Arrival Information Details Patient Name: Date of Service: Carlos White 12/07/2019 12:45 PM Medical Record Number: [id number] Patient Account Number: [id number] Date of Birth/Sex: Treating RN: 11/23/1940 (79 y.o. [name]) Carlos White Primary Care Carlos White: Carlos White Other Clinician: Referring Carlos White: Treating Carlos White/Extender: [name] in Treatment: 70 Visit Information History Since Last Visit All ordered tests and consults were completed: No Patient Arrived: Ambulatory Added or deleted any medications: No Arrival Time: 13:06 Any new allergies or adverse reactions: No Accompanied By: self Had a fall or experienced change in No Transfer Assistance: None activities of daily living that may affect Patient Identification Verified: Yes risk of falls: Secondary Verification Process Completed: Yes Signs or symptoms of abuse/neglect since last visito No Patient Requires Transmission-Based Precautions: No Hospitalized since last visit: No Patient Has Alerts: Yes Implantable device outside of the clinic excluding No Patient Alerts: R ABI= 1.23, TBI = .86 cellular tissue based products placed in the center L ABI= 1.28, TBI=.81 since last visit: Has Dressing in Place as Prescribed: Yes Has Compression in Place as Prescribed: Yes Pain Present Now: No Electronic Signature(s) Signed: 12/07/2019 4:15:58 PM By: Carlos Coria RN Entered By: Carlos White on 12/07/2019 13:06:43 -------------------------------------------------------------------------------- Clinic Level of Care Assessment Details Patient Name: Date of Service: Carlos White 12/07/2019 12:45 PM Medical Record Number: [id number] Patient Account Number: [id number] Date of Birth/Sex: Treating RN: 05/22/1940 (79 y.o. [name] Primary Care Jyden Kromer: Carlos White Other Clinician: Referring Issaih Kaus: Treating Josephine Rudnick/Extender:  [name] in Treatment: 114 Clinic Level of Care Assessment Items TOOL 4 Quantity Score []  - 0 Use when only an EandM is performed on FOLLOW-UP visit ASSESSMENTS - Nursing Assessment / Reassessment X- 1 10 Reassessment of Co-morbidities (includes updates in patient status) X- 1 5 Reassessment of Adherence to Treatment Plan ASSESSMENTS - Wound and Skin A ssessment / Reassessment []  - 0 Simple Wound Assessment / Reassessment - one wound X- 3 5 Complex Wound Assessment / Reassessment - multiple wounds []  - 0 Dermatologic / Skin Assessment (not related to wound area) ASSESSMENTS - Focused Assessment X- 2 5 Circumferential Edema Measurements - multi extremities []  - 0 Nutritional Assessment / Counseling / Intervention X- 1 5 Lower Extremity Assessment (monofilament, tuning fork, pulses) []  - 0 Peripheral Arterial Disease Assessment (using hand held doppler) ASSESSMENTS - Ostomy and/or Continence Assessment and Care []  - 0 Incontinence Assessment and Management []  - 0 Ostomy Care Assessment and Management (repouching, etc.) PROCESS - Coordination of Care X - Simple Patient / Family Education for ongoing care 1 15 []  - 0 Complex (extensive) Patient / Family Education for ongoing care X- 1 10 Staff obtains Programmer, systems, Records, T Results / Process Orders est []  - 0 Staff telephones HHA, Nursing Homes / Clarify orders / etc []  - 0 Routine Transfer to another Facility (non-emergent condition) []  - 0 Routine Hospital Admission (non-emergent condition) []  - 0 New Admissions / Biomedical engineer / Ordering NPWT Apligraf, etc. , []  - 0 Emergency Hospital Admission (emergent condition) X- 1 10 Simple Discharge Coordination []  - 0 Complex (extensive) Discharge Coordination PROCESS - Special Needs []  - 0 Pediatric / Minor Patient Management []  - 0 Isolation Patient Management []  - 0 Hearing / Language / Visual special needs []  - 0 Assessment  of Community assistance (transportation, D/C planning, etc.) []  - 0 Additional assistance / Altered mentation []  - 0 Support  Surface(s) Assessment (bed, cushion, seat, etc.) INTERVENTIONS - Wound Cleansing / Measurement []  - 0 Simple Wound Cleansing - one wound X- 3 5 Complex Wound Cleansing - multiple wounds X- 1 5 Wound Imaging (photographs - any number of wounds) []  - 0 Wound Tracing (instead of photographs) []  - 0 Simple Wound Measurement - one wound X- 3 5 Complex Wound Measurement - multiple wounds INTERVENTIONS - Wound Dressings X - Small Wound Dressing one or multiple wounds 1 10 X- 2 15 Medium Wound Dressing one or multiple wounds []  - 0 Large Wound Dressing one or multiple wounds X- 1 5 Application of Medications - topical []  - 0 Application of Medications - injection INTERVENTIONS - Miscellaneous []  - 0 External ear exam []  - 0 Specimen Collection (cultures, biopsies, blood, body fluids, etc.) []  - 0 Specimen(s) / Culture(s) sent or taken to Lab for analysis []  - 0 Patient Transfer (multiple staff / Civil Service fast streamer / Similar devices) []  - 0 Simple Staple / Suture removal (25 or less) []  - 0 Complex Staple / Suture removal (26 or more) []  - 0 Hypo / Hyperglycemic Management (close monitor of Blood Glucose) []  - 0 Ankle / Brachial Index (ABI) - do not check if billed separately X- 1 5 Vital Signs Has the patient been seen at the hospital within the last three years: Yes Total Score: 165 Level Of Care: New/Established - Level 5 Electronic Signature(s) Signed: 12/07/2019 4:58:27 PM By: Carlos Gouty RN, BSN Entered By: Carlos White on 12/07/2019 13:50:19 -------------------------------------------------------------------------------- Encounter Discharge Information Details Patient Name: Date of Service: Carlos White, GO RDO Carlos White. 12/07/2019 12:45 PM Medical Record Number: [id number] Patient Account Number: [id number] Date of Birth/Sex: Treating RN: [birth year]  (79 y.o. [name]) Carlos White Primary Care Everet Flagg: Carlos White Other Clinician: Referring Leandrew Keech: Treating Topher Buenaventura/Extender: [name] in Treatment: 114 Encounter Discharge Information Items Discharge Condition: Stable Ambulatory Status: Ambulatory Discharge Destination: Home Transportation: Private Auto Accompanied By: self Schedule Follow-up Appointment: Yes Clinical Summary of Care: Patient Declined Electronic Signature(s) Signed: 12/07/2019 4:15:58 PM By: Carlos Coria RN Entered By: Carlos White on 12/07/2019 14:05:03 -------------------------------------------------------------------------------- Lower Extremity Assessment Details Patient Name: Date of Service: Carlos White 12/07/2019 12:45 PM Medical Record Number: [id number] Patient Account Number: [id number] Date of Birth/Sex: Treating RN: Oct 28, 1940 (79 y.o. [name]) Carlos White Primary Care Renny Gunnarson: Carlos White Other Clinician: Referring Bessie Boyte: Treating Orchid Glassberg/Extender: [name] in Treatment: 114 Edema Assessment Assessed: [Left: No] [Right: No] Edema: [Left: Yes] [Right: Yes] Calf Left: Right: Point of Measurement: 35.5 cm From Medial Instep 37 cm 34.5 cm Ankle Left: Right: Point of Measurement: 11 cm From Medial Instep 25.5 cm 24 cm Electronic Signature(s) Signed: 12/07/2019 4:15:58 PM By: Carlos Coria RN Entered By: Carlos White on 12/07/2019 13:15:37 -------------------------------------------------------------------------------- [place] Details Patient Name: Date of Service: Carlos White, GO RDO N H. 12/07/2019 12:45 PM Medical Record Number: [id number] Patient Account Number: [id number] Date of Birth/Sex: Treating RN: 06-12-1940 (79 y.o. [name] Primary Care Thelma Viana: Carlos White Other Clinician: Referring Mistie Adney: Treating Christinea Brizuela/Extender: [name] in Treatment: [phone] Active  Inactive Venous Leg Ulcer Nursing Diagnoses: Knowledge deficit related to disease process and management Potential for venous Insuffiency (use before diagnosis confirmed) Goals: Patient will maintain optimal edema control Date Initiated: 09/30/2017 Target Resolution Date: 01/04/2020 Goal Status: Active Patient/caregiver will verbalize understanding of disease process and disease management Date Initiated: 09/30/2017 Date Inactivated: 11/04/2017 Target Resolution Date:  10/30/2017 Goal Status: Met Interventions: Assess peripheral edema status every visit. Provide education on venous insufficiency Notes: Wound/Skin Impairment Nursing Diagnoses: Impaired tissue integrity Knowledge deficit related to ulceration/compromised skin integrity Goals: Patient/caregiver will verbalize understanding of skin care regimen Date Initiated: 09/30/2017 Target Resolution Date: 01/04/2020 Goal Status: Active Ulcer/skin breakdown will have a volume reduction of 30% by week 4 Date Initiated: 09/30/2017 Date Inactivated: 11/04/2017 Target Resolution Date: 10/30/2017 Goal Status: Met Interventions: Assess patient/caregiver ability to obtain necessary supplies Assess patient/caregiver ability to perform ulcer/skin care regimen upon admission and as needed Assess ulceration(s) every visit Provide education on ulcer and skin care Notes: Electronic Signature(s) Signed: 12/07/2019 4:58:27 PM By: Carlos Gouty RN, BSN Entered By: Carlos White on 12/07/2019 13:46:33 -------------------------------------------------------------------------------- Pain Assessment Details Patient Name: Date of Service: [name] RDO Carlos White. 12/07/2019 12:45 PM Medical Record Number: [id number] Patient Account Number: [id number] Date of Birth/Sex: Treating RN: Jul 13, 1940 (79 y.o. Oval Linsey Primary Care Malyssa Maris: Carlos White Other Clinician: Referring Jamil Armwood: Treating Kaliope Quinonez/Extender: [name] in Treatment: 114 Active Problems Location of Pain Severity and Description of Pain Patient Has Paino No Site Locations Pain Management and Medication Current Pain Management: Electronic Signature(s) Signed: 12/07/2019 4:15:58 PM By: Carlos Coria RN Entered By: Carlos White on 12/07/2019 13:15:30 -------------------------------------------------------------------------------- Patient/Caregiver Education Details Patient Name: Date of Service: Carlos White 11/24/2021andnbsp12:45 PM Medical Record Number: [id number] Patient Account Number: [id number] Date of Birth/Gender: Treating RN: [birth year] (79 y.o. [name] Primary Care Physician: Carlos White Other Clinician: Referring Physician: Treating Physician/Extender: [name] in Treatment: [place] Education Assessment Education Provided To: Patient Education Topics Provided Venous: Methods: Explain/Verbal Responses: Reinforcements needed, State content correctly Wound/Skin Impairment: Methods: Explain/Verbal Responses: Reinforcements needed, State content correctly Electronic Signature(s) Signed: 12/07/2019 4:58:27 PM By: Carlos Gouty RN, BSN Entered By: Carlos White on 12/07/2019 13:48:19 -------------------------------------------------------------------------------- Wound Assessment Details Patient Name: Date of Service: Carlos White, GO RDO Carlos White. 12/07/2019 12:45 PM Medical Record Number: [id number] Patient Account Number: [id number] Date of Birth/Sex: Treating RN: 1940/12/30 (79 y.o. [name]) Carlos White Primary Care Ji Feldner: Carlos White Other Clinician: Referring Damareon Lanni: Treating Odie Edmonds/Extender: [name] in Treatment: 114 Wound Status Wound Number: 17 Primary Venous Leg Ulcer Etiology: Wound Location: Right, Medial Malleolus Wound Status: Open Wounding Event: Gradually Appeared Comorbid Cataracts, Chronic Obstructive Pulmonary Disease  (COPD), Date Acquired: 09/28/2019 History: Hypertension Weeks Of Treatment: 9 Clustered Wound: No Wound Measurements Length: (cm) 2 Width: (cm) 2.1 Depth: (cm) 0.2 Area: (cm) 3.299 Volume: (cm) 0.66 % Reduction in Area: -31.3% % Reduction in Volume: 12.5% Epithelialization: None Tunneling: No Undermining: No Wound Description Classification: Full Thickness Without Exposed Support Structures Wound Margin: Well defined, not attached Exudate Amount: Medium Exudate Type: Serosanguineous Exudate Color: red, brown Foul Odor After Cleansing: No Slough/Fibrino Yes Wound Bed Granulation Amount: Small (1-33%) Exposed Structure Granulation Quality: Pink Fascia Exposed: No Necrotic Amount: Large (67-100%) Fat Layer (Subcutaneous Tissue) Exposed: Yes Necrotic Quality: Adherent Slough Tendon Exposed: No Muscle Exposed: No Joint Exposed: No Bone Exposed: No Treatment Notes Wound #17 (Right, Medial Malleolus) 1. Cleanse With Saline Soap and water 2. Periwound Care Barrier cream 3. Primary Dressing Applied Other primary dressing (specifiy in notes) 4. Secondary Dressing ABD Pad Dry Gauze Roll Gauze 6. Support Layer Psychologist, forensic Notes fibrocol, patient to apply keystone once he arrives home as he forgot to bring it Engineer, maintenance) Signed: 12/07/2019 4:15:58 PM By: Carlos Coria RN  Entered By: Carlos White on 12/07/2019 13:17:16 -------------------------------------------------------------------------------- Wound Assessment Details Patient Name: Date of Service: Carlos White 12/07/2019 12:45 PM Medical Record Number: [id number] Patient Account Number: [id number] Date of Birth/Sex: Treating RN: 10-14-1940 (79 y.o. [name]) Carlos White Primary Care Kenny Rea: Carlos White Other Clinician: Referring Trudy Kory: Treating Samyria Rudie/Extender: [name] in Treatment: 114 Wound Status Wound Number: 2 Primary Venous Leg Ulcer Etiology: Wound  Location: Left, Distal, Lateral Lower Leg Wound Status: Open Wounding Event: Gradually Appeared Comorbid Cataracts, Chronic Obstructive Pulmonary Disease (COPD), Date Acquired: 03/13/2017 History: Hypertension Weeks Of Treatment: 114 Clustered Wound: No Wound Measurements Length: (cm) 13.5 Width: (cm) 17 Depth: (cm) 0.1 Area: (cm) 180.249 Volume: (cm) 18.025 % Reduction in Area: -132.5% % Reduction in Volume: 53.5% Epithelialization: Small (1-33%) Tunneling: No Undermining: No Wound Description Classification: Full Thickness Without Exposed Support Structures Wound Margin: Flat and Intact Exudate Amount: Large Exudate Type: Serosanguineous Exudate Color: red, brown Foul Odor After Cleansing: No Slough/Fibrino Yes Wound Bed Granulation Amount: Large (67-100%) Exposed Structure Granulation Quality: Red Fascia Exposed: No Necrotic Amount: Small (1-33%) Fat Layer (Subcutaneous Tissue) Exposed: Yes Necrotic Quality: Adherent Slough Tendon Exposed: No Muscle Exposed: No Joint Exposed: No Bone Exposed: No Treatment Notes Wound #2 (Left, Distal, Lateral Lower Leg) 1. Cleanse With Saline Soap and water 2. Periwound Care Barrier cream 3. Primary Dressing Applied Other primary dressing (specifiy in notes) 4. Secondary Dressing ABD Pad Dry Gauze Roll Gauze 6. Support Layer Psychologist, forensic Notes fibrocol, patient to apply keystone once he arrives home as he forgot to bring it Engineer, maintenance) Signed: 12/07/2019 4:15:58 PM By: Carlos Coria RN Entered By: Carlos White on 12/07/2019 13:18:10 -------------------------------------------------------------------------------- Wound Assessment Details Patient Name: Date of Service: Carlos White 12/07/2019 12:45 PM Medical Record Number: [id number] Patient Account Number: [id number] Date of Birth/Sex: Treating RN: [birth year] (79 y.o. [name]) Carlos White Primary Care Kyra Laffey: Carlos White Other Clinician: Referring  Tashawn Laswell: Treating Treyven Lafauci/Extender: [name] in Treatment: 114 Wound Status Wound Number: 5 Primary Vasculopathy Etiology: Wound Location: Right, Proximal, Lateral Lower Leg Wound Status: Open Wounding Event: Gradually Appeared Comorbid Cataracts, Chronic Obstructive Pulmonary Disease (COPD), Date Acquired: 10/26/2017 History: Hypertension Weeks Of Treatment: 110 Clustered Wound: Yes Wound Measurements Length: (cm) 11 Width: (cm) 16 Depth: (cm) 0.1 Clustered Quantity: 2 Area: (cm) 138.23 Volume: (cm) 13.823 % Reduction in Area: -154.6% % Reduction in Volume: -154.6% Epithelialization: Small (1-33%) Tunneling: No Undermining: No Wound Description Classification: Full Thickness Without Exposed Support Structures Wound Margin: Flat and Intact Exudate Amount: Large Exudate Type: Serosanguineous Exudate Color: red, brown Foul Odor After Cleansing: No Slough/Fibrino No Wound Bed Granulation Amount: Large (67-100%) Exposed Structure Granulation Quality: Red Fascia Exposed: No Necrotic Amount: None Present (0%) Fat Layer (Subcutaneous Tissue) Exposed: Yes Tendon Exposed: No Muscle Exposed: No Joint Exposed: No Bone Exposed: No Treatment Notes Wound #5 (Right, Proximal, Lateral Lower Leg) 1. Cleanse With Saline Soap and water 2. Periwound Care Barrier cream 3. Primary Dressing Applied Other primary dressing (specifiy in notes) 4. Secondary Dressing ABD Pad Dry Gauze Roll Gauze 6. Support Layer Psychologist, forensic Notes fibrocol, patient to apply keystone once he arrives home as he forgot to bring it Engineer, maintenance) Signed: 12/07/2019 4:15:58 PM By: Carlos Coria RN Entered By: Carlos White on 12/07/2019 13:18:27 -------------------------------------------------------------------------------- [place] Details Patient Name: Date of Service: Carlos White, GO RDO Carlos White. 12/07/2019 12:45 PM Medical Record Number: [id number] Patient  Account Number:  [id number] Date of Birth/Sex: Treating RN: [birth year] (79 y.o. [name]) Carlos White Primary Care Roark Rufo: Carlos White Other Clinician: Referring Flay Ghosh: Treating Jona Zappone/Extender: [name] in Treatment: 114 Vital Signs Time Taken: 13:06 Temperature (F): 97.5 Height (in): 71 Pulse (bpm): 69 Weight (lbs): 220 Respiratory Rate (breaths/min): 20 Body Mass Index (BMI): 30.7 Blood Pressure (mmHg): 152/81 Reference Range: 80 - 120 mg / dl Electronic Signature(s) Signed: 12/07/2019 4:15:58 PM By: Carlos Coria RN Entered By: Carlos White on 12/07/2019 13:07:25

## 2019-12-13 ENCOUNTER — Other Ambulatory Visit: Payer: Self-pay | Admitting: Internal Medicine

## 2020-01-01 ENCOUNTER — Other Ambulatory Visit: Payer: Self-pay | Admitting: Internal Medicine

## 2020-01-18 ENCOUNTER — Other Ambulatory Visit: Payer: Self-pay

## 2020-01-18 ENCOUNTER — Encounter (HOSPITAL_BASED_OUTPATIENT_CLINIC_OR_DEPARTMENT_OTHER): Payer: Medicare PPO | Attending: Physician Assistant | Admitting: Physician Assistant

## 2020-01-18 DIAGNOSIS — I872 Venous insufficiency (chronic) (peripheral): Secondary | ICD-10-CM | POA: Insufficient documentation

## 2020-01-18 DIAGNOSIS — L97822 Non-pressure chronic ulcer of other part of left lower leg with fat layer exposed: Secondary | ICD-10-CM | POA: Diagnosis not present

## 2020-01-18 DIAGNOSIS — L97812 Non-pressure chronic ulcer of other part of right lower leg with fat layer exposed: Secondary | ICD-10-CM | POA: Insufficient documentation

## 2020-01-18 DIAGNOSIS — Q818 Other epidermolysis bullosa: Secondary | ICD-10-CM | POA: Diagnosis not present

## 2020-01-18 DIAGNOSIS — I10 Essential (primary) hypertension: Secondary | ICD-10-CM | POA: Insufficient documentation

## 2020-01-18 DIAGNOSIS — L97312 Non-pressure chronic ulcer of right ankle with fat layer exposed: Secondary | ICD-10-CM | POA: Diagnosis not present

## 2020-01-18 DIAGNOSIS — L97322 Non-pressure chronic ulcer of left ankle with fat layer exposed: Secondary | ICD-10-CM | POA: Insufficient documentation

## 2020-01-18 NOTE — Progress Notes (Signed)
Carlos White, Carlos White ([id number]) Visit Report for 01/18/2020 Arrival Information Details Patient Name: Date of Service: Carlos White 01/18/2020 12:30 PM Medical Record Number: [id number] Patient Account Number: [id number] Date of Birth/Sex: Treating RN: 07/19/1940 (80 y.o. Carlos White Primary Care Provider: Tedra White Other Clinician: Referring Provider: Treating Provider/Extender: [name] in Treatment: 10 Visit Information History Since Last Visit Added or deleted any medications: No Patient Arrived: Ambulatory Any new allergies or adverse reactions: No Arrival Time: 12:54 Had a fall or experienced change in No Accompanied By: self activities of daily living that may affect Transfer Assistance: None risk of falls: Patient Identification Verified: Yes Signs or symptoms of abuse/neglect since last visito No Secondary Verification Process Completed: Yes Hospitalized since last visit: No Patient Requires Transmission-Based Precautions: No Implantable device outside of the clinic excluding No Patient Has Alerts: Yes cellular tissue based products placed in the center Patient Alerts: R ABI= 1.23, TBI = .86 since last visit: L ABI= 1.28, TBI=.81 Has Dressing in Place as Prescribed: Yes Pain Present Now: No Electronic Signature(s) Signed: 01/18/2020 1:45:43 PM By: Sandre Kitty Entered By: Sandre Kitty on 01/18/2020 12:54:35 -------------------------------------------------------------------------------- Clinic Level of Care Assessment Details Patient Name: Date of Service: Carlos White 01/18/2020 12:30 PM Medical Record Number: [id number] Patient Account Number: [id number] Date of Birth/Sex: Treating RN: Jul 20, 1940 (80 y.o. Carlos White Primary Care Provider: Tedra White Other Clinician: Referring Provider: Treating Provider/Extender: [name] in Treatment: 120 Clinic Level of Care Assessment Items TOOL  4 Quantity Score X- 1 0 Use when only an EandM is performed on FOLLOW-UP visit ASSESSMENTS - Nursing Assessment / Reassessment X- 1 10 Reassessment of Co-morbidities (includes updates in patient status) X- 1 5 Reassessment of Adherence to Treatment Plan ASSESSMENTS - Wound and Skin A ssessment / Reassessment [] - 0 Simple Wound Assessment / Reassessment - one wound X- 3 5 Complex Wound Assessment / Reassessment - multiple wounds [] - 0 Dermatologic / Skin Assessment (not related to wound area) ASSESSMENTS - Focused Assessment [] - 0 Circumferential Edema Measurements - multi extremities [] - 0 Nutritional Assessment / Counseling / Intervention X- 1 5 Lower Extremity Assessment (monofilament, tuning fork, pulses) [] - 0 Peripheral Arterial Disease Assessment (using hand held doppler) ASSESSMENTS - Ostomy and/or Continence Assessment and Care [] - 0 Incontinence Assessment and Management [] - 0 Ostomy Care Assessment and Management (repouching, etc.) PROCESS - Coordination of Care X - Simple Patient / Family Education for ongoing care 1 15 [] - 0 Complex (extensive) Patient / Family Education for ongoing care X- 1 10 Staff obtains Programmer, systems, Records, T Results / Process Orders est [] - 0 Staff telephones HHA, Nursing Homes / Clarify orders / etc [] - 0 Routine Transfer to another Facility (non-emergent condition) [] - 0 Routine Hospital Admission (non-emergent condition) [] - 0 New Admissions / Biomedical engineer / Ordering NPWT Apligraf, etc. , [] - 0 Emergency Hospital Admission (emergent condition) X- 1 10 Simple Discharge Coordination [] - 0 Complex (extensive) Discharge Coordination PROCESS - Special Needs [] - 0 Pediatric / Minor Patient Management [] - 0 Isolation Patient Management [] - 0 Hearing / Language / Visual special needs [] - 0 Assessment of Community assistance (transportation, D/C planning, etc.) [] - 0 Additional assistance / Altered  mentation [] - 0 Support Surface(s) Assessment (bed, cushion, seat, etc.) INTERVENTIONS - Wound Cleansing / Measurement [] - 0 Simple  Wound Cleansing - one wound X- 3 5 Complex Wound Cleansing - multiple wounds X- 1 5 Wound Imaging (photographs - any number of wounds) [] - 0 Wound Tracing (instead of photographs) [] - 0 Simple Wound Measurement - one wound X- 3 5 Complex Wound Measurement - multiple wounds INTERVENTIONS - Wound Dressings [] - 0 Small Wound Dressing one or multiple wounds [] - 0 Medium Wound Dressing one or multiple wounds X- 2 20 Large Wound Dressing one or multiple wounds X- 1 5 Application of Medications - topical [] - 0 Application of Medications - injection INTERVENTIONS - Miscellaneous [] - 0 External ear exam [] - 0 Specimen Collection (cultures, biopsies, blood, body fluids, etc.) [] - 0 Specimen(s) / Culture(s) sent or taken to Lab for analysis [] - 0 Patient Transfer (multiple staff / [employer] fast streamer / Similar devices) [] - 0 Simple Staple / Suture removal (25 or less) [] - 0 Complex Staple / Suture removal (26 or more) [] - 0 Hypo / Hyperglycemic Management (close monitor of Blood Glucose) [] - 0 Ankle / Brachial Index (ABI) - do not check if billed separately X- 1 5 Vital Signs Has the patient been seen at the hospital within the last three years: Yes Total Score: 155 Level Of Care: New/Established - Level 4 Electronic Signature(s) Signed: 01/18/2020 5:18:45 PM By: Levan Hurst RN, BSN Entered By: Levan Hurst on 01/18/2020 16:13:13 -------------------------------------------------------------------------------- Lower Extremity Assessment Details Patient Name: Date of Service: Carlos White RDO Carlos White. 01/18/2020 12:30 PM Medical Record Number: [id number] Patient Account Number: [id number] Date of Birth/Sex: Treating RN: 08/13/1940 (80 y.o. Carlos White Primary Care Provider: Tedra White Other Clinician: Referring Provider: Treating  Provider/Extender: [name] in Treatment: 120 Edema Assessment Assessed: [Left: No] [Right: No] Edema: [Left: Yes] [Right: Yes] Calf Left: Right: Point of Measurement: 35.5 cm From Medial Instep 37 cm 34.5 cm Ankle Left: Right: Point of Measurement: 11 cm From Medial Instep 25.5 cm 24 cm Vascular Assessment Pulses: Dorsalis Pedis Palpable: [Left:Yes] [Right:Yes] Electronic Signature(s) Signed: 01/18/2020 5:18:45 PM By: Levan Hurst RN, BSN Entered By: Levan Hurst on 01/18/2020 13:20:04 -------------------------------------------------------------------------------- Multi-Disciplinary Care Plan Details Patient Name: Date of Service: Carlos White, Carlos RDO N H. 01/18/2020 12:30 PM Medical Record Number: [id number] Patient Account Number: [id number] Date of Birth/Sex: Treating RN: [birth year] (80 y.o. Carlos White Primary Care Provider: Tedra White Other Clinician: Referring Provider: Treating Provider/Extender: [name] in Treatment: 120 Active Inactive Venous Leg Ulcer Nursing Diagnoses: Knowledge deficit related to disease process and management Potential for venous Insuffiency (use before diagnosis confirmed) Goals: Patient will maintain optimal edema control Date Initiated: 09/30/2017 Target Resolution Date: 02/17/2020 Goal Status: Active Patient/caregiver will verbalize understanding of disease process and disease management Date Initiated: 09/30/2017 Date Inactivated: 11/04/2017 Target Resolution Date: 10/30/2017 Goal Status: Met Interventions: Assess peripheral edema status every visit. Provide education on venous insufficiency Notes: Wound/Skin Impairment Nursing Diagnoses: Impaired tissue integrity Knowledge deficit related to ulceration/compromised skin integrity Goals: Patient/caregiver will verbalize understanding of skin care regimen Date Initiated: 09/30/2017 Target Resolution Date: 02/17/2020 Goal Status:  Active Ulcer/skin breakdown will have a volume reduction of 30% by week 4 Date Initiated: 09/30/2017 Date Inactivated: 11/04/2017 Target Resolution Date: 10/30/2017 Goal Status: Met Interventions: Assess patient/caregiver ability to obtain necessary supplies Assess patient/caregiver ability to perform ulcer/skin care regimen upon admission and as needed Assess ulceration(s) every visit Provide education on ulcer and skin care Notes: Electronic Signature(s) Signed: 01/18/2020  5:18:45 PM By: Levan Hurst RN, BSN Entered By: Levan Hurst on 01/18/2020 16:12:17 -------------------------------------------------------------------------------- Pain Assessment Details Patient Name: Date of Service: Carlos White RDO Carlos White. 01/18/2020 12:30 PM Medical Record Number: [id number] Patient Account Number: [id number] Date of Birth/Sex: Treating RN: 1940-03-15 (80 y.o. Carlos White Primary Care Provider: Tedra White Other Clinician: Referring Provider: Treating Provider/Extender: [name] in Treatment: 120 Active Problems Location of Pain Severity and Description of Pain Patient Has Paino No Site Locations Pain Management and Medication Current Pain Management: Electronic Signature(s) Signed: 01/18/2020 1:45:43 PM By: Sandre Kitty Signed: 01/18/2020 5:18:45 PM By: Levan Hurst RN, BSN Entered By: Sandre Kitty on 01/18/2020 12:54:56 -------------------------------------------------------------------------------- Patient/Caregiver Education Details Patient Name: Date of Service: Carlos White 1/5/2022andnbsp12:30 PM Medical Record Number: [id number] Patient Account Number: [id number] Date of Birth/Gender: Treating RN: [birth year] (80 y.o. Carlos White Primary Care Physician: Carlos White Other Clinician: Referring Physician: Treating Physician/Extender: [name] in Treatment: 16 Education Assessment Education  Provided To: Patient Education Topics Provided Wound/Skin Impairment: Methods: Explain/Verbal Responses: State content correctly [employer]) Signed: 01/18/2020 5:18:45 PM By: Levan Hurst RN, BSN Entered By: Levan Hurst on 01/18/2020 16:12:30 -------------------------------------------------------------------------------- Wound Assessment Details Patient Name: Date of Service: Carlos White RDO Carlos White 01/18/2020 12:30 PM Medical Record Number: [id number] Patient Account Number: [id number] Date of Birth/Sex: Treating RN: 07/01/1940 (80 y.o. Carlos White Primary Care Provider: Tedra White Other Clinician: Referring Provider: Treating Provider/Extender: [name] in Treatment: 120 Wound Status Wound Number: 17 Primary Venous Leg Ulcer Etiology: Wound Location: Right, Medial Malleolus Wound Status: Open Wounding Event: Gradually Appeared Comorbid Cataracts, Chronic Obstructive Pulmonary Disease (COPD), Date Acquired: 09/28/2019 History: Hypertension Weeks Of Treatment: 15 Clustered Wound: No Wound Measurements Length: (cm) 1.9 Width: (cm) 1.5 Depth: (cm) 0.2 Area: (cm) 2.238 Volume: (cm) 0.448 % Reduction in Area: 10.9% % Reduction in Volume: 40.6% Epithelialization: None Tunneling: No Undermining: No Wound Description Classification: Full Thickness Without Exposed Support Structures Wound Margin: Well defined, not attached Exudate Amount: Medium Exudate Type: Serosanguineous Exudate Color: red, brown Foul Odor After Cleansing: No Slough/Fibrino Yes Wound Bed Granulation Amount: Small (1-33%) Exposed Structure Granulation Quality: Pink Fascia Exposed: No Necrotic Amount: Large (67-100%) Fat Layer (Subcutaneous Tissue) Exposed: Yes Necrotic Quality: Adherent Slough Tendon Exposed: No Muscle Exposed: No Joint Exposed: No Bone Exposed: No Electronic Signature(s) Signed: 01/18/2020 5:18:45 PM By: Levan Hurst RN,  BSN Entered By: Levan Hurst on 01/18/2020 13:13:50 -------------------------------------------------------------------------------- Wound Assessment Details Patient Name: Date of Service: Carlos White RDO N H. 01/18/2020 12:30 PM Medical Record Number: [id number] Patient Account Number: [id number] Date of Birth/Sex: Treating RN: Oct 05, 1940 (80 y.o. Carlos White Primary Care Provider: Tedra White Other Clinician: Referring Provider: Treating Provider/Extender: [name] in Treatment: 120 Wound Status Wound Number: 2 Primary Venous Leg Ulcer Etiology: Wound Location: Left, Distal, Lateral Lower Leg Wound Status: Open Wounding Event: Gradually Appeared Comorbid Cataracts, Chronic Obstructive Pulmonary Disease (COPD), Date Acquired: 03/13/2017 History: Hypertension Weeks Of Treatment: 120 Clustered Wound: No Wound Measurements Length: (cm) 14.5 Width: (cm) 15 Depth: (cm) 0.1 Area: (cm) 170.824 Volume: (cm) 17.082 % Reduction in Area: -120.4% % Reduction in Volume: 55.9% Epithelialization: Small (1-33%) Tunneling: No Undermining: No Wound Description Classification: Full Thickness Without Exposed Support Structures Wound Margin: Flat and Intact Exudate Amount: Large Exudate Type: Serosanguineous Exudate Color: red, brown Foul Odor After Cleansing: No Slough/Fibrino No Wound Bed  Granulation Amount: Large (67-100%) Exposed Structure Granulation Quality: Red Fascia Exposed: No Necrotic Amount: None Present (0%) Fat Layer (Subcutaneous Tissue) Exposed: Yes Tendon Exposed: No Muscle Exposed: No Joint Exposed: No Bone Exposed: No Electronic Signature(s) Signed: 01/18/2020 5:18:45 PM By: Levan Hurst RN, BSN Entered By: Levan Hurst on 01/18/2020 13:14:26 -------------------------------------------------------------------------------- Wound Assessment Details Patient Name: Date of Service: Carlos White RDO N H. 01/18/2020 12:30 PM Medical  Record Number: [id number] Patient Account Number: [id number] Date of Birth/Sex: Treating RN: [birth year] (80 y.o. Carlos White Primary Care Provider: Tedra White Other Clinician: Referring Provider: Treating Provider/Extender: [name] in Treatment: 120 Wound Status Wound Number: 5 Primary Vasculopathy Etiology: Wound Location: Right, Proximal, Lateral Lower Leg Wound Status: Open Wounding Event: Gradually Appeared Comorbid Cataracts, Chronic Obstructive Pulmonary Disease (COPD), Date Acquired: 10/26/2017 History: Hypertension Weeks Of Treatment: 116 Clustered Wound: Yes Wound Measurements Length: (cm) 9 Width: (cm) 2.7 Depth: (cm) 0.1 Clustered Quantity: 2 Area: (cm) 19.085 Volume: (cm) 1.909 % Reduction in Area: 64.8% % Reduction in Volume: 64.8% Epithelialization: Small (1-33%) Tunneling: No Undermining: No Wound Description Classification: Full Thickness Without Exposed Support Structures Wound Margin: Flat and Intact Exudate Amount: Large Exudate Type: Serosanguineous Exudate Color: red, brown Foul Odor After Cleansing: No Slough/Fibrino No Wound Bed Granulation Amount: Large (67-100%) Exposed Structure Granulation Quality: Red Fascia Exposed: No Necrotic Amount: None Present (0%) Fat Layer (Subcutaneous Tissue) Exposed: Yes Tendon Exposed: No Muscle Exposed: No Joint Exposed: No Bone Exposed: No Electronic Signature(s) Signed: 01/18/2020 5:18:45 PM By: Levan Hurst RN, BSN Entered By: Levan Hurst on 01/18/2020 13:14:47 -------------------------------------------------------------------------------- Vitals Details Patient Name: Date of Service: Carlos White, Carlos RDO N H. 01/18/2020 12:30 PM Medical Record Number: [id number] Patient Account Number: [id number] Date of Birth/Sex: Treating RN: 01-05-1941 (80 y.o. Carlos White Primary Care Provider: Tedra White Other Clinician: Referring Provider: Treating Provider/Extender:  [name] in Treatment: 120 Vital Signs Time Taken: 15:54 Temperature (F): 97.6 Height (in): 71 Pulse (bpm): 68 Weight (lbs): 220 Respiratory Rate (breaths/min): 20 Body Mass Index (BMI): 30.7 Blood Pressure (mmHg): 166/73 Reference Range: 80 - 120 mg / dl Electronic Signature(s) Signed: 01/18/2020 1:45:43 PM By: Sandre Kitty Entered By: Sandre Kitty on 01/18/2020 12:54:49

## 2020-01-19 DIAGNOSIS — S81801A Unspecified open wound, right lower leg, initial encounter: Secondary | ICD-10-CM | POA: Diagnosis not present

## 2020-01-19 DIAGNOSIS — S91001A Unspecified open wound, right ankle, initial encounter: Secondary | ICD-10-CM | POA: Diagnosis not present

## 2020-01-19 DIAGNOSIS — S81802A Unspecified open wound, left lower leg, initial encounter: Secondary | ICD-10-CM | POA: Diagnosis not present

## 2020-01-19 NOTE — Progress Notes (Addendum)
Carlos, White ([id number]) Visit Report for 01/18/2020 Chief Complaint Document Details Patient Name: Date of Service: Carlos White 01/18/2020 12:30 PM Medical Record Number: [id number] Patient Account Number: [id number] Date of Birth/Sex: Treating RN: 01/05/1941 (80 y.o. Elizebeth Koller Primary Care Provider: Marlan Palau Other Clinician: Referring Provider: Treating Provider/Extender: Karen Kays in Treatment: 120 Information Obtained from: Patient Chief Complaint Bilateral LE Ulcers Electronic Signature(s) Signed: 01/18/2020 1:19:03 PM By: Lenda Kelp PA-C Entered By: Lenda Kelp on 01/18/2020 13:19:03 -------------------------------------------------------------------------------- Problem List Details Patient Name: Date of Service: Carlos White [name]. 01/18/2020 12:30 PM Medical Record Number: [id number] Patient Account Number: [id number] Date of Birth/Sex: Treating RN: [birth year] (80 y.o. 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

## 2020-02-28 DIAGNOSIS — H43813 Vitreous degeneration, bilateral: Secondary | ICD-10-CM | POA: Diagnosis not present

## 2020-02-28 DIAGNOSIS — H353132 Nonexudative age-related macular degeneration, bilateral, intermediate dry stage: Secondary | ICD-10-CM | POA: Diagnosis not present

## 2020-02-28 DIAGNOSIS — H35033 Hypertensive retinopathy, bilateral: Secondary | ICD-10-CM | POA: Diagnosis not present

## 2020-02-29 ENCOUNTER — Other Ambulatory Visit: Payer: Self-pay

## 2020-02-29 ENCOUNTER — Encounter (HOSPITAL_BASED_OUTPATIENT_CLINIC_OR_DEPARTMENT_OTHER): Payer: Medicare PPO | Attending: Physician Assistant | Admitting: Physician Assistant

## 2020-02-29 DIAGNOSIS — L97812 Non-pressure chronic ulcer of other part of right lower leg with fat layer exposed: Secondary | ICD-10-CM | POA: Insufficient documentation

## 2020-02-29 DIAGNOSIS — Q819 Epidermolysis bullosa, unspecified: Secondary | ICD-10-CM | POA: Diagnosis not present

## 2020-02-29 DIAGNOSIS — L97822 Non-pressure chronic ulcer of other part of left lower leg with fat layer exposed: Secondary | ICD-10-CM | POA: Insufficient documentation

## 2020-02-29 DIAGNOSIS — L97312 Non-pressure chronic ulcer of right ankle with fat layer exposed: Secondary | ICD-10-CM | POA: Diagnosis not present

## 2020-02-29 DIAGNOSIS — L97322 Non-pressure chronic ulcer of left ankle with fat layer exposed: Secondary | ICD-10-CM | POA: Insufficient documentation

## 2020-02-29 DIAGNOSIS — I872 Venous insufficiency (chronic) (peripheral): Secondary | ICD-10-CM | POA: Insufficient documentation

## 2020-02-29 NOTE — Progress Notes (Addendum)
SHAHID, FLORI ([id number]) Visit Report for 02/29/2020 Chief Complaint Document Details Patient Name: Date of Service: Carlos White 02/29/2020 12:30 PM Medical Record Number: [id number] Patient Account Number: [id number] Date of Birth/Sex: Treating RN: September 06, 1940 (80 y.o. [name] Primary Care Provider: [name] Other Clinician: Referring Provider: Treating Provider/Extender: [name] in Treatment: 126 Information Obtained from: Patient Chief Complaint Bilateral LE Ulcers Electronic Signature(s) Signed: 02/29/2020 12:46:17 PM By: Worthy Keeler PA-C Entered By: Worthy Keeler on 02/29/2020 12:46:17 -------------------------------------------------------------------------------- HPI Details Patient Name: Date of Service: Carlos Dull RDO N H. 02/29/2020 12:30 PM Medical Record Number: [id number] Patient Account Number: [id number] Date of Birth/Sex: Treating RN: 10-08-1940 (80 y.o. [name] Primary Care Provider: [name] Other Clinician: Referring Provider: Treating Provider/Extender: [name] in Treatment: 126 History of Present Illness HPI Description: 09/30/17 on evaluation today patient presents for initial evaluation and our clinic concerning issues that he has been having with his bilateral lower extremities. He states this has been going on for quite some time at least six months. Currently his regiment has been mainly cleaning the area with peroxide, applying the is foreign ointment, and wrapping the area with ABD pads and then an ace wrap loosely. He has dealt with issues of this nature he tells me for quite some time. He does have a history of having had a compound fracture of the left lower extremity which he thinks also makes this a much more difficult area for him to heal. He's previously been told that he had poor vascular flow but this was years ago at [facility] we do not have any of those  records at this time. He has a history of Epidermolysis Bullosa which was diagnosed around age 50 and he has been cared for at [facility] since that time. Subsequently he states this is hereditary and two of his children one male and one male also have this as well is one of his grandchildren that he is aware of. He has no evidence of infection necessarily at this point although he does have some necrotic tissue noted on the surface of the wound as far as the largest, left lateral lower extremity ulcer, is concerned. Overall I feel like all things considered he's been taking care of this very well. Obviously he has some fairly significant issues going on at this point in this regard. He does have a history otherwise of hypertension though for the most part other than the compound fracture of the left leg he seems to have been fairly healthy in my pinion. 10/07/17 on evaluation today patient actually appears to be doing better in regard to his bilateral lower extremity ulcers. With that being said he does still have some evidence of slough noted on the surface of the wounds I think the Iodoflex has been beneficial for him. His arterial studies are scheduled for October 2. With that being said I do believe that he is continuing to show signs of good improvement which is at least good news. 10/14/17 on evaluation today patient appears to be doing very well in regard to his lower extremity ulcers. He's definitely made some progress as far as healing is concerned although there still are several open areas that are going to need to be addressed. He did have his arterial study today which fortunately shows good findings with a right ABI of 1.23 with a TBI of 0.86 in the left ABI  of 1.28 with a TBI of 0.81. This is good news and will allow [place] to perform debridement as well. 10/23/2017; patient with a large wound on the left lateral calf, sizable area on the left medial malleolus and an area on the right lateral  malleolus. He has a new blister consistent with his underlying blistering skin disease just above this area we have been using Iodoflex on the lateral left calf lateral right ankle and collagen on the medial left ankle. We have been using Kerlix Coban wraps 10/28/17 on evaluation today the patient continues to have signs of improvement in regard to the overall appearance of the original wound. Unfortunately he did have some blistering over the right lateral lower extremity which has appeared to rupture on evaluation today and likely some of the dead tissue on the surface needs to be cleaned away the good news is this does not appear to be to significantly deep at this time. 11/04/17 on evaluation today patient actually appears to be doing a little better in regard to his lower extremity ulcers. He has been tolerating the dressing changes without complication. With that being said he does still have a significant wound especially over the left lateral lower extremity unfortunately. All of the wounds pretty much are going to require sharp debridement today. 11/11/17 on evaluation today patient appears to be doing more poorly in regard to his left lower extremity in particular. There does not appear to be any evidence of systemic infection although the wound itself as far as the larger left lateral lower extremity ulcer actually appears to be infected in my pinion. There's an older and the surface of the wound is dramatically worsened compared to last week. No fevers, chills, nausea, or vomiting noted at this time. 11/18/17 upon evaluation today patient actually appears to be doing better. I did review his culture today which really did not show any specific organism is a positive reason for his wound decline. There are multiple organisms present not predominant. Nonetheless he seems to be tolerate the doxycycline well in his wounds in general do seem to be doing better. Fortunately there does not appear  to be any evidence of infection at this time which is good news. Overall I'm very pleased with how things appear. Nonetheless he still has a lot of healing to Carlos. I do think he could benefit from a Juxta-Lite wrap. 11/25/17 on evaluation today patient actually appears to be doing fairly well in regard to his wounds. He is still taking the antibiotics he has a few days left. Fortunately this seems to have been excellent for him as far as getting the infection control and very happy in this regard. With that being said the patient likewise is also very pleased with how things appear at this time in comparison to where we were he's not having as much pain. 12/02/17 Seen today for follow p and management of LLE wounds. Wounds appear to show some improvement. He denies pain, fever, or chills. Completed a course of doxycycline earlier this month. Scheduled to received Juxta-Lite wrap this week. No s/s of infections. 12/09/17 on evaluation today patient actually appears to be doing a little bit better in regard to his wounds. This is obscene very slow process and unfortunately he has a couple of new areas and this is due to the Epidermolysis Bullosa. Nonetheless I am concerned about the fact that he seems to be getting more areas not less that is the reason we're gonna work on  getting schedule for the vascular referral to see the venous specialist. 12/23/17 upon evaluation today patient's wounds currently shows evidence of still not doing quite as well is what I would like to have seen. Subsequently the patient did have his venous study which showed evidence of venous stasis. Subsequently I do think that a vascular evaluation for consideration of venous intervention would be appropriate. I'm not necessarily suggesting that will be anything that can be done but I think it is at least a good idea. He is in agreement with this plan. 12/30/17 on evaluation today patient actually appears to be doing very well in  regard to his wounds when compared to previous evaluation. Subsequently we have been using the [employer] Dressing which actually appears to have done excellent on his left lateral lower extremity ulcer. The quality of the wound surface is dramatically improved. There is some slight debridement that is going to be required at a couple of locations but overall I'm extremely pleased with how things appear here. 01/07/2018; this is a patient with a primary skin disorder epidermolyis bullosa. Is a large wound on the left lateral calf and smaller wounds on the right however there is a new wound on the right mid tibia area that occurred within the compression wrap that he did not change. We have been using Hydrofera Blue. On the left he is using Hydrofera Blue and Santyl to the inferior part of the wound and changing the dressing himself. 01/15/2018; primary skin disorder epidermolysis bullosa. He has several difficult wounds including the left lateral calf, smaller wounds on the left medial calf and the right lateral calf. The major area on the left lateral calf has a smaller area inferiorly that has necrotic debris we have been using Santyl to this. The rest of the wounds we have been using Hydrofera Blue. The area on the left calf actually looks larger this week. Uncontrolled edema several small open areas above it that are superficial 01/20/18 on evaluation today patient appears to be doing better as compared to last week in regard to his wounds of the bilateral lower extremities. He tolerated the bilateral compression wrap without complication. Overall I'm very pleased with how things appear at this time. The patient likewise is very happy. 01/27/18 on evaluation today patient appears to be doing decently well in regard to his bilateral lower extremity ulcers. He's been tolerating the dressing changes without complication. One issue he had is that he did have more drainage to the left leg wrapped last  week. He states in fact he probably should come in and let us change it on Friday however he just left it in place and kept adding extra absorption with ABD pads to the external portion of the wrap. Unfortunately he does have some aspiration type breakdown nothing significant but I do believe that this was probably counterproductive in general. Nonetheless his wounds do not appear to be terrible overall. 02/03/18 on evaluation today patient appears to be doing rather well in regard to his lower extremity ulcers. He has been tolerating the dressing changes without complication. He does tell me that he had to change the wrap on the left one since we last saw him. Subsequently I do not see any evidence of infection I do feel like the food was much better controlled at this point. 02/10/18 on evaluation today patient appears to be doing rather well in regard to his ulcers. He still has significant alterations especially on the left lateral lower extremity.  Fortunately there's no signs of infection at this time. Overall I feel like he is making good progress are some areas that I'm gonna attempt some debridement today. 02/17/18 on evaluation today patient appears to be doing okay in regard to his lower Trinity ulcer. It does appear on both locations he has a little bit of drainage causing some breakdown in maceration around the wound bed's although it doesn't appear to be too bad the right is a little bit worse than left. Fortunately there's no signs of infection which is good news. No fevers, chills, nausea, or vomiting noted at this time. 03/10/18 on evaluation today patient actually appears to be doing rather poorly in regard to his bilateral lower extremity ulcers. The right in particular is draining profusely and the wound is actually enlarging which is not good. I'm concerned about both possibly infection and the fact that there's a lot of moisture which is causing breakdown as well. Unfortunately the  patient has been trying to change this at home I'm afraid he may need to change more frequently in order to see the improvement that were looking for. There's no signs of systemic infection. 03/17/18 patient actually appears to be doing significantly better at this point in regard to his bilateral lower extremity ulcers. Fortunately there's no signs of infection. That is worsening infection at least indefinitely nothing systemic. With that being said he is having a lot of drainage though not quite as much is there in his last evaluation. Overall feel like he's on the side of improvement. I think if his results back from his culture which showed that he had a positive group B strep along with abundant Pseudomonas noted on the culture. For that reason I am gonna have him continue with the linezolid as we previously have ordered for him and I did Carlos ahead as well today and prescribe Levaquin as well in order to treat the Pseudomonas portion of the infection noted. 03/24/18 on evaluation today patient actually appears to be doing very well in regard to his lower Trinity ulcer. He's been tolerating the dressing changes without complication. Fortunately both legs show signs of less drainage in his edema is very well controlled at this point as well. Overall very pleased with how things seem to be progressing. 03/31/18 on evaluation today patient actually appears to be doing excellent in regard to his bilateral lower extremity ulcers. These are not draining nearly as significant as what they were in the past overall seem to be shown signs of excellent improvement which is great news. Fortunately there is no sign of active infection at this time I do believe that the Levaquin has done extremely well for him in this regard. The patient continues to change these at home typically every day. We may be able to slowly work towards every other day changes since the drainage seems to be slowing down quite  significantly. 04/14/18 on evaluation today patient appears to be doing well in regard to his bilateral lower extremities. Let me Hilda Blades Almost completely healed which is excellent news. Fortunately he's shown signs of improvement all other sites as well with new skin growth there's some slight hyper granular tissue but for the most part this seems to be well maintained with the [facility] Dressing. I'm very happy in this regard. 04/28/18 on evaluation today patient appears to be doing rather well in regard to his ulcers of the bilateral lower extremities all things considering. He continues to make some progress as far  as new skin growth. There still some hyper granulation noted at this point despite the use of the [facility] Dressing. This is not terrible but I think we may want to consider conclude cauterization today with silver nitrate to try to help knock some of his back as well as helping with any biofilm on the surface of the wound. 05/12/18 on evaluation today patient's wounds actually appear to be doing fairly well in regard to the bilateral lower extremities. He's been tolerating the dressing changes without complication. Fortunately there's no signs of active infection at this time which is good news. Overall very pleased with how things seem to be progressing. You select silver nitrate was beneficial for him. 05/26/18 on evaluation today patient appears to be doing better in regard to left lower extremity and a little bit worse in regard to the right lower extremity. He states that he was pulling off the [facility] Dressing peel back some of the skin making this area significantly larger than what it was previous. He's not had any issues other than this and states even that hasn't caused any pain he just seems to obviously have a much larger area on the right when compared to the previous time I saw him. No fevers, chills, nausea, or vomiting noted at this time. 06/16/18 on  evaluation today patient actually appears to be doing a little better in my pinion in regard to his lower summary ulcers. He has new skin islands that seem to be spreading which is good news. Fortunately there's no signs of active infection at this time. His biggest issue is he tells me that coming as often as he does is becoming very cost prohibitive. He wonders if we can potentially spread this out. 07/14/18 on evaluation today patient appears to be doing a little bit worse in regard to his lower from the ulcer. Unfortunately he still continues to have a significant amount of drainage I think we need to do something to try to help this more. He is still somewhat reluctant to Carlos the route of the Wound VAC although that may be the most appropriate thing for him. No fevers, chills, nausea, or vomiting noted at this time. 08/18/2018 on evaluation today patient actually appears to be doing quite well with regard to his bilateral lower extremity ulcers. I do feel like that currently he is making great progress the care max does seem to be doing a great job at helping to control the moisture he has no maceration or skin breakdown. Again this seems to be an excellent way to Carlos. 1 thing we may want to change is adding collagen to the base of the wound and then the care max over top he is not opposed to this. 09/15/2018 on evaluation today patient appears to be doing well with regard to his bilateral lower extremity ulcers. He is showing some signs of improvement not necessarily in size but definitely in appearance. In fact he has a lot of new skin growing throughout the wounds along the edges as well as in the central portion of the wounds on both lower extremities. Overall I am extremely pleased to see this. 10/20/2018 on evaluation today patient actually appears to be doing quite well with regard to his wounds. They are not measuring significantly smaller but he does have a lot of new epithelization noted as  compared to previous. Fortunately there is no signs of active infection at this time. No fevers, chills, nausea, vomiting, or diarrhea. 11/17/2018  on evaluation today patient presents for reevaluation concerning his bilateral lower extremity ulcers. Fortunately there is no signs of active infection at this time today. He has been tolerating the dressing changes without complication. No fevers, chills, nausea, vomiting, or diarrhea. Unfortunately in general the patient has not made as much improvement as I would like to have seen up to this point. He has been tolerating the dressing changes without complication and he does an excellent job taking care of his wounds at home in my opinion. The biggest issue I see is that he is just not making the progress that we need to be seeing currently. I think we may want to consider having him seen at a plastic surgery appointment and he has previously seen someone in years past at [facility] in [place]. That is definitely a possibility for [place] to look into at this point. 12/29/2018 on evaluation today patient appears to be doing better in regard to the overall visual appearance of his wounds which do not appear to be as macerated. He does have a much larger skin island in the middle of the left lower extremity ulcer which is doing much better. He tells me the pain is also significantly better. With that being said overall his improvement as far as the size of the wounds is not better but again these are very irregular in change shape quite often. Fortunately there is no evidence of active infection at this time which is great news. He never heard anything from [facility] regarding the plastic surgery referral that we made to them. 01/26/2019 upon evaluation today patient appears to be doing a little better in regard to his wounds today. He has been tolerating the dressing changes again he performs these for the most part on his own. He does a great  job wrapping his legs in my opinion. Unfortunately he has not been able to get down to [facility] to see if there is anything from a plastic surgery standpoint that could be done to help with his legs simply due to the fact that his wife unfortunately sustained a compression fracture in her spine she is seeing Dr. Saintclair Halsted and subsequently is going to be having what sounds to be a kyphoplasty type procedure. With that being said that has not been scheduled yet there is still waiting on an MRI the patient is very busy in fact overly busy trying to help take care of his wife at this point. I completely understand this is more of a strain on him at this time 02/23/2019 upon evaluation today patient actually appears to be making some progress. I am actually very pleased with the overall appearance of his wounds even compared to last evaluation. He seems to be doing quite well. He is taking care of his wife unfortunately she did have a compression fracture she has had the procedure for this but still she has a slow road to recovery. For that reason he still not gone to [facility] for a second opinion in this regard. Obviously the goal there was if there was anything that can be done from a skin graft standpoint or otherwise. 03/23/2019 upon evaluation today patient continues to have issues with lower extremity ulcers. Since the beginning he has made progress but at the same time the wounds unfortunately just will not close. We have been trying to get the patient to [facility] to see a specialist there but unfortunately with the everything going on with his  wife he has not been able to make that appointment time yet he states he may be able to in the next 1-2 months but is not really sure. 04/27/2019 on evaluation today patient appears to be doing a little bit more poorly. His last evaluation. He appears to have some erythema around the edges of the wound at this point. Fortunately there is no  signs of active infection at this time which is good news. No fevers, chills, nausea, vomiting, or diarrhea. 06/08/2019 on evaluation today patient appears to be doing well with regard to his wounds. Overall they are actually measuring smaller compared to the last visit last month. We did treat him for Pseudomonas as well as methicillin-resistant Staph aureus. He was only on the treatment for MRSA however for 7 days as the Cipro was resistant and subsequently we had to place him on doxycycline. Nonetheless I am thinking that we may want to add the doxycycline and just do a month-long treatment considering the longstanding nature of his wounds and see if we get this under better control. 07/13/2019 upon evaluation today patient appears to be doing fairly well in regard to his bilateral lower extremities. There does not appear to be any signs of active infection which is good news. No fevers, chills, nausea, vomiting, or diarrhea. 08/10/2019 upon evaluation today patient appears to be doing about the same with regard to his wounds in general. Unfortunately he is not significantly better although is also not significantly worse which is great news there is no evidence of active infection at this time which is good news. He still dealing with a lot going on with his wife and therefore is not really able to Carlos see anyone at the specialty clinic at [facility] that we have previously set up still. 09/07/2019 on evaluation today patient appears to be doing well with regard to his wounds. In fact this is probably the best that have seen so far in quite a few months. Overall I am very pleased with where things stand at this time. No fevers, chills, nausea, vomiting, or diarrhea. 10/05/2019 upon evaluation today patient appears to be doing more poorly in regard to his legs at this point. He actually is showing some signs of infection. This has been something that we seem to be back-and-forth with as far as trying to keep  these wounds from becoming infected. He takes care of them very well in my opinion but nonetheless I am concerned in this regard. He tells me that his wife is still doing really about the same she is slowly getting better. Nonetheless he still spends the majority of his time helping to take care of her. 11/02/2019 upon evaluation today patient actually appears to be doing somewhat better in regard to his legs. I do believe that the compounded antibiotic treatment from [facility] [facility]. Overall I am extremely pleased with where things stand today. There is no signs of active infection at this time. The patient states he has much less drainage than he has in the past. 12/07/2019 upon evaluation today patient appears to be doing well at this time in regard to the overall appearance of his wound bed. Currently there is no signs of active infection at this time. 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 Especially when  he stressed. Fortunately there is no signs of active infection locally or systemically at this point. 01/18/2020 on evaluation today patient appears to be doing well with regard to his leg ulcers. He has been tolerating the dressing changes without complication. Fortunately there is no signs of infection and overall I feel like his legs are doing about the best they have done in quite some time. There does not appear to be any evidence of active infection which is great news and overall very pleased. 02/29/2020 upon evaluation today patient's wounds actually appear to be doing quite well currently. There is no sign of active infection at this time. No fevers, chills, nausea, vomiting, or diarrhea. Electronic Signature(s) Signed: 02/29/2020 1:05:34 PM By: Worthy Keeler PA-C Entered By: Worthy Keeler on  02/29/2020 13:05:34 -------------------------------------------------------------------------------- Physical Exam Details Patient Name: Date of Service: Carlos White 02/29/2020 12:30 PM Medical Record Number: [id number] Patient Account Number: [id number] Date of Birth/Sex: Treating RN: [birth year] (80 y.o. [name] Primary Care Provider: [name] Other Clinician: Referring Provider: Treating Provider/Extender: [name] in Treatment: 25 Constitutional Well-nourished and well-hydrated in no acute distress. Respiratory normal breathing without difficulty. Psychiatric this patient is able to make decisions and demonstrates good insight into disease process. Alert and Oriented x 3. pleasant and cooperative. Notes Upon inspection patient's wounds are actually showing signs of good granulation epithelization. There does not appear to be evidence of active infection which is great news and overall very pleased with where things stand today. Electronic Signature(s) Signed: 02/29/2020 1:05:47 PM By: Worthy Keeler PA-C Entered By: Worthy Keeler on 02/29/2020 13:05:47 -------------------------------------------------------------------------------- Physician Orders Details Patient Name: Date of Service: Carlos Dull RDO [name] 02/29/2020 12:30 PM Medical Record Number: [id number] Patient Account Number: [id number] Date of Birth/Sex: Treating RN: 03/26/1940 (80 y.o. 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 - on days that dressing is changed Edema Control - Lymphedema / SCD / Other Bilateral Lower Extremities Elevate legs to the level of the heart or above for 30 minutes daily and/or when sitting, a frequency of: - throughout the day Avoid standing for long periods of time. Exercise regularly Moisturize legs daily. Compression stocking or Garment 20-30 mm/Hg pressure to: - Juxtalite to both legs daily Wound Treatment Wound #17 - Malleolus Wound Laterality: Right, Medial Cleanser: Soap and Water Every Other Day/30 Days Discharge Instructions: May shower and wash wound with dial antibacterial soap and water prior to dressing change. Peri-Wound Care: Zinc Oxide Ointment 30g tube Every Other Day/30 Days Discharge Instructions: Apply Zinc Oxide to periwound with each dressing change as needed for maceration Peri-Wound Care: Sween Lotion (Moisturizing lotion) Every Other Day/30 Days Discharge Instructions: Apply moisturizing lotion as directed Topical: Keystone antibiotic compound Every Other Day/30 Days Discharge Instructions: Apply thin layer to wound bed under collagen Prim Dressing: FIBRACOL Plus Dressing, 4x8.75 (in/in) (  DME) (Dispense As Written) Every Other Day/30 Days ary Discharge Instructions: apply to wound bed Secondary Dressing: ABD Pad, 5x9 (DME) Every Other Day/30 Days Discharge Instructions: Apply over primary dressing as directed. Secondary Dressing: Zetuvit Plus 4x8 in Every Other Day/30 Days Discharge Instructions: Apply over primary dressing as directed. Secondary Dressing: Xtrasorb Classic Super Absorbent Dressing, 6x9 (in/in) (DME) (Dispense As Written) Every Other Day/30 Days Discharge Instructions: Apply over  primary dressing as directed. Secured With: Elastic Bandage 4 inch (ACE bandage) Every Other Day/30 Days Discharge Instructions: Secure with ACE bandage as directed. Secured With: [employer], 4.5x3.1 (in/yd) (DME) (Generic) Every Other Day/30 Days Discharge Instructions: Secure with Kerlix as directed. Wound #2 - Lower Leg Wound Laterality: Left, Lateral, Distal Cleanser: Soap and Water Every Other Day/30 Days Discharge Instructions: May shower and wash wound with dial antibacterial soap and water prior to dressing change. Peri-Wound Care: Zinc Oxide Ointment 30g tube Every Other Day/30 Days Discharge Instructions: Apply Zinc Oxide to periwound with each dressing change as needed for maceration Peri-Wound Care: Sween Lotion (Moisturizing lotion) Every Other Day/30 Days Discharge Instructions: Apply moisturizing lotion as directed Topical: Keystone antibiotic compound Every Other Day/30 Days Discharge Instructions: Apply thin layer to wound bed under collagen Prim Dressing: FIBRACOL Plus Dressing, 4x8.75 (in/in) (DME) (Dispense As Written) Every Other Day/30 Days ary Discharge Instructions: apply to wound bed Secondary Dressing: ABD Pad, 5x9 (DME) Every Other Day/30 Days Discharge Instructions: Apply over primary dressing as directed. Secondary Dressing: Zetuvit Plus 4x8 in Every Other Day/30 Days Discharge Instructions: Apply over primary dressing as directed. Secondary Dressing: Xtrasorb Classic Super Absorbent Dressing, 6x9 (in/in) (DME) (Dispense As Written) Every Other Day/30 Days Discharge Instructions: Apply over primary dressing as directed. Secured With: Elastic Bandage 4 inch (ACE bandage) Every Other Day/30 Days Discharge Instructions: Secure with ACE bandage as directed. Secured With: [employer], 4.5x3.1 (in/yd) (DME) (Generic) Every Other Day/30 Days Discharge Instructions: Secure with Kerlix as directed. Wound #5 - Lower Leg Wound Laterality: Right, Lateral,  Proximal Cleanser: Soap and Water Every Other Day/30 Days Discharge Instructions: May shower and wash wound with dial antibacterial soap and water prior to dressing change. Peri-Wound Care: Zinc Oxide Ointment 30g tube Every Other Day/30 Days Discharge Instructions: Apply Zinc Oxide to periwound with each dressing change as needed for maceration Peri-Wound Care: Sween Lotion (Moisturizing lotion) Every Other Day/30 Days Discharge Instructions: Apply moisturizing lotion as directed Topical: Keystone antibiotic compound Every Other Day/30 Days Discharge Instructions: Apply thin layer to wound bed under collagen Prim Dressing: FIBRACOL Plus Dressing, 4x8.75 (in/in) (DME) (Dispense As Written) Every Other Day/30 Days ary Discharge Instructions: apply to wound bed Secondary Dressing: ABD Pad, 5x9 (DME) Every Other Day/30 Days Discharge Instructions: Apply over primary dressing as directed. Secondary Dressing: Zetuvit Plus 4x8 in Every Other Day/30 Days Discharge Instructions: Apply over primary dressing as directed. Secondary Dressing: Xtrasorb Classic Super Absorbent Dressing, 6x9 (in/in) (DME) (Dispense As Written) Every Other Day/30 Days Discharge Instructions: Apply over primary dressing as directed. Secured With: Elastic Bandage 4 inch (ACE bandage) Every Other Day/30 Days Discharge Instructions: Secure with ACE bandage as directed. Secured With: [employer], 4.5x3.1 (in/yd) (DME) (Generic) Every Other Day/30 Days Discharge Instructions: Secure with Kerlix as directed. Electronic Signature(s) Signed: 02/29/2020 5:28:15 PM By: Worthy Keeler PA-C Signed: 02/29/2020 6:01:36 PM By: Baruch Gouty RN, BSN Entered By: Baruch Gouty on 02/29/2020 13:04:03 -------------------------------------------------------------------------------- Problem List Details Patient Name: Date of Service: Carlos White, Carlos RDO N H. 02/29/2020 12:30  PM Medical Record Number: [id number] Patient Account Number:  [id number] Date of Birth/Sex: Treating RN: Oct 11, 1940 (80 y.o. 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 09/30/2017 No Yes L97.322 Non-pressure chronic ulcer of left ankle with fat layer exposed 09/30/2017 No Yes L97.822 Non-pressure chronic ulcer of other part of left lower leg with fat layer exposed9/18/2019 No Yes L97.812 Non-pressure chronic ulcer of other part of right lower leg with fat layer 09/30/2017 No Yes exposed I10 Essential (primary) hypertension 09/30/2017 No Yes Inactive Problems Resolved Problems Electronic Signature(s) Signed: 02/29/2020 12:46:08 PM By: Worthy Keeler PA-C Entered By: Worthy Keeler on 02/29/2020 12:46:08 -------------------------------------------------------------------------------- Progress Note Details Patient Name: Date of Service: Carlos Dull RDO N H. 02/29/2020 12:30 PM Medical Record Number: [id number] Patient Account Number: [id number] Date of Birth/Sex: Treating RN: 04-Nov-1940 (80 y.o. [name] Primary Care Provider: [name] Other Clinician: Referring Provider: Treating Provider/Extender: [name] in Treatment: 126 Subjective Chief Complaint Information obtained from Patient Bilateral LE Ulcers History of Present Illness (HPI) 09/30/17 on evaluation today patient presents for initial evaluation and our clinic concerning issues that he has been having with his bilateral lower extremities. He states this has been going on for quite some time at least six months. Currently his regiment has been mainly cleaning the area with peroxide, applying the is foreign ointment, and wrapping the area with ABD  pads and then an ace wrap loosely. He has dealt with issues of this nature he tells me for quite some time. He does have a history of having had a compound fracture of the left lower extremity which he thinks also makes this a much more difficult area for him to heal. He's previously been told that he had poor vascular flow but this was years ago at [facility] we do not have any of those records at this time. He has a history of Epidermolysis Bullosa which was diagnosed around age 10 and he has been cared for at [facility] since that time. Subsequently he states this is hereditary and two of his children one male and one male also have this as well is one of his grandchildren that he is aware of. He has no evidence of infection necessarily at this point although he does have some necrotic tissue noted on the surface of the wound as far as the largest, left lateral lower extremity ulcer, is concerned. Overall I feel like all things considered he's been taking care of this very well. Obviously he has some fairly significant issues going on at this point in this regard. He does have a history otherwise of hypertension though for the most part other than the compound fracture of the left leg he seems to have been fairly healthy in my pinion. 10/07/17 on evaluation today patient actually appears to be doing better in regard to his bilateral lower extremity ulcers. With that being said he does still have some evidence of slough noted on the surface of the wounds I think the Iodoflex has been beneficial for him. His arterial studies are scheduled for October 2. With that being said I do believe that he is continuing to show signs of good improvement which is at least good news. 10/14/17 on evaluation today patient appears to be doing  very well in regard to his lower extremity ulcers. He's definitely made some progress as far as healing is concerned although there still are several open areas that are going to need to be  addressed. He did have his arterial study today which fortunately shows good findings with a right ABI of 1.23 with a TBI of 0.86 in the left ABI of 1.28 with a TBI of 0.81. This is good news and will allow [place] to perform debridement as well. 10/23/2017; patient with a large wound on the left lateral calf, sizable area on the left medial malleolus and an area on the right lateral malleolus. He has a new blister consistent with his underlying blistering skin disease just above this area we have been using Iodoflex on the lateral left calf lateral right ankle and collagen on the medial left ankle. We have been using Kerlix Coban wraps 10/28/17 on evaluation today the patient continues to have signs of improvement in regard to the overall appearance of the original wound. Unfortunately he did have some blistering over the right lateral lower extremity which has appeared to rupture on evaluation today and likely some of the dead tissue on the surface needs to be cleaned away the good news is this does not appear to be to significantly deep at this time. 11/04/17 on evaluation today patient actually appears to be doing a little better in regard to his lower extremity ulcers. He has been tolerating the dressing changes without complication. With that being said he does still have a significant wound especially over the left lateral lower extremity unfortunately. All of the wounds pretty much are going to require sharp debridement today. 11/11/17 on evaluation today patient appears to be doing more poorly in regard to his left lower extremity in particular. There does not appear to be any evidence of systemic infection although the wound itself as far as the larger left lateral lower extremity ulcer actually appears to be infected in my pinion. There's an older and the surface of the wound is dramatically worsened compared to last week. No fevers, chills, nausea, or vomiting noted at this time. 11/18/17 upon  evaluation today patient actually appears to be doing better. I did review his culture today which really did not show any specific organism is a positive reason for his wound decline. There are multiple organisms present not predominant. Nonetheless he seems to be tolerate the doxycycline well in his wounds in general do seem to be doing better. Fortunately there does not appear to be any evidence of infection at this time which is good news. Overall I'm very pleased with how things appear. Nonetheless he still has a lot of healing to Carlos. I do think he could benefit from a Juxta-Lite wrap. 11/25/17 on evaluation today patient actually appears to be doing fairly well in regard to his wounds. He is still taking the antibiotics he has a few days left. Fortunately this seems to have been excellent for him as far as getting the infection control and very happy in this regard. With that being said the patient likewise is also very pleased with how things appear at this time in comparison to where we were he's not having as much pain. 12/02/17 Seen today for follow p and management of LLE wounds. Wounds appear to show some improvement. He denies pain, fever, or chills. Completed a course of doxycycline earlier this month. Scheduled to received Juxta-Lite wrap this week. No s/s of infections. 12/09/17 on evaluation today patient  actually appears to be doing a little bit better in regard to his wounds. This is obscene very slow process and unfortunately he has a couple of new areas and this is due to the Epidermolysis Bullosa. Nonetheless I am concerned about the fact that he seems to be getting more areas not less that is the reason we're gonna work on getting schedule for the vascular referral to see the venous specialist. 12/23/17 upon evaluation today patient's wounds currently shows evidence of still not doing quite as well is what I would like to have seen. Subsequently the patient did have his venous  study which showed evidence of venous stasis. Subsequently I do think that a vascular evaluation for consideration of venous intervention would be appropriate. I'm not necessarily suggesting that will be anything that can be done but I think it is at least a good idea. He is in agreement with this plan. 12/30/17 on evaluation today patient actually appears to be doing very well in regard to his wounds when compared to previous evaluation. Subsequently we have been using the [facility] Dressing which actually appears to have done excellent on his left lateral lower extremity ulcer. The quality of the wound surface is dramatically improved. There is some slight debridement that is going to be required at a couple of locations but overall I'm extremely pleased with how things appear here. 01/07/2018; this is a patient with a primary skin disorder epidermolyis bullosa. Is a large wound on the left lateral calf and smaller wounds on the right however there is a new wound on the right mid tibia area that occurred within the compression wrap that he did not change. We have been using Hydrofera Blue. On the left he is using Hydrofera Blue and Santyl to the inferior part of the wound and changing the dressing himself. 01/15/2018; primary skin disorder epidermolysis bullosa. He has several difficult wounds including the left lateral calf, smaller wounds on the left medial calf and the right lateral calf. The major area on the left lateral calf has a smaller area inferiorly that has necrotic debris we have been using Santyl to this. The rest of the wounds we have been using Hydrofera Blue. The area on the left calf actually looks larger this week. Uncontrolled edema several small open areas above it that are superficial 01/20/18 on evaluation today patient appears to be doing better as compared to last week in regard to his wounds of the bilateral lower extremities. He tolerated the bilateral compression wrap  without complication. Overall I'm very pleased with how things appear at this time. The patient likewise is very happy. 01/27/18 on evaluation today patient appears to be doing decently well in regard to his bilateral lower extremity ulcers. He's been tolerating the dressing changes without complication. One issue he had is that he did have more drainage to the left leg wrapped last week. He states in fact he probably should come in and let us change it on Friday however he just left it in place and kept adding extra absorption with ABD pads to the external portion of the wrap. Unfortunately he does have some aspiration type breakdown nothing significant but I do believe that this was probably counterproductive in general. Nonetheless his wounds do not appear to be terrible overall. 02/03/18 on evaluation today patient appears to be doing rather well in regard to his lower extremity ulcers. He has been tolerating the dressing changes without complication. He does tell me that he had to  change the wrap on the left one since we last saw him. Subsequently I do not see any evidence of infection I do feel like the food was much better controlled at this point. 02/10/18 on evaluation today patient appears to be doing rather well in regard to his ulcers. He still has significant alterations especially on the left lateral lower extremity. Fortunately there's no signs of infection at this time. Overall I feel like he is making good progress are some areas that I'm gonna attempt some debridement today. 02/17/18 on evaluation today patient appears to be doing okay in regard to his lower Trinity ulcer. It does appear on both locations he has a little bit of drainage causing some breakdown in maceration around the wound bed's although it doesn't appear to be too bad the right is a little bit worse than left. Fortunately there's no signs of infection which is good news. No fevers, chills, nausea, or vomiting noted at  this time. 03/10/18 on evaluation today patient actually appears to be doing rather poorly in regard to his bilateral lower extremity ulcers. The right in particular is draining profusely and the wound is actually enlarging which is not good. I'm concerned about both possibly infection and the fact that there's a lot of moisture which is causing breakdown as well. Unfortunately the patient has been trying to change this at home I'm afraid he may need to change more frequently in order to see the improvement that were looking for. There's no signs of systemic infection. 03/17/18 patient actually appears to be doing significantly better at this point in regard to his bilateral lower extremity ulcers. Fortunately there's no signs of infection. That is worsening infection at least indefinitely nothing systemic. With that being said he is having a lot of drainage though not quite as much is there in his last evaluation. Overall feel like he's on the side of improvement. I think if his results back from his culture which showed that he had a positive group B strep along with abundant Pseudomonas noted on the culture. For that reason I am gonna have him continue with the linezolid as we previously have ordered for him and I did Carlos ahead as well today and prescribe Levaquin as well in order to treat the Pseudomonas portion of the infection noted. 03/24/18 on evaluation today patient actually appears to be doing very well in regard to his lower Trinity ulcer. He's been tolerating the dressing changes without complication. Fortunately both legs show signs of less drainage in his edema is very well controlled at this point as well. Overall very pleased with how things seem to be progressing. 03/31/18 on evaluation today patient actually appears to be doing excellent in regard to his bilateral lower extremity ulcers. These are not draining nearly as significant as what they were in the past overall seem to be shown  signs of excellent improvement which is great news. Fortunately there is no sign of active infection at this time I do believe that the Levaquin has done extremely well for him in this regard. The patient continues to change these at home typically every day. We may be able to slowly work towards every other day changes since the drainage seems to be slowing down quite significantly. 04/14/18 on evaluation today patient appears to be doing well in regard to his bilateral lower extremities. Let me [facility] Almost completely healed which is excellent news. Fortunately he's shown signs of improvement all other sites as well with new  skin growth there's some slight hyper granular tissue but for the most part this seems to be well maintained with the [facility] Dressing. I'm very happy in this regard. 04/28/18 on evaluation today patient appears to be doing rather well in regard to his ulcers of the bilateral lower extremities all things considering. He continues to make some progress as far as new skin growth. There still some hyper granulation noted at this point despite the use of the [facility] Dressing. This is not terrible but I think we may want to consider conclude cauterization today with silver nitrate to try to help knock some of his back as well as helping with any biofilm on the surface of the wound. 05/12/18 on evaluation today patient's wounds actually appear to be doing fairly well in regard to the bilateral lower extremities. He's been tolerating the dressing changes without complication. Fortunately there's no signs of active infection at this time which is good news. Overall very pleased with how things seem to be progressing. You select silver nitrate was beneficial for him. 05/26/18 on evaluation today patient appears to be doing better in regard to left lower extremity and a little bit worse in regard to the right lower extremity. He states that he was pulling off the [facility] Dressing peel back some of the skin making this area significantly larger than what it was previous. He's not had any issues other than this and states even that hasn't caused any pain he just seems to obviously have a much larger area on the right when compared to the previous time I saw him. No fevers, chills, nausea, or vomiting noted at this time. 06/16/18 on evaluation today patient actually appears to be doing a little better in my pinion in regard to his lower summary ulcers. He has new skin islands that seem to be spreading which is good news. Fortunately there's no signs of active infection at this time. His biggest issue is he tells me that coming as often as he does is becoming very cost prohibitive. He wonders if we can potentially spread this out. 07/14/18 on evaluation today patient appears to be doing a little bit worse in regard to his lower from the ulcer. Unfortunately he still continues to have a significant amount of drainage I think we need to do something to try to help this more. He is still somewhat reluctant to Carlos the route of the Wound VAC although that may be the most appropriate thing for him. No fevers, chills, nausea, or vomiting noted at this time. 08/18/2018 on evaluation today patient actually appears to be doing quite well with regard to his bilateral lower extremity ulcers. I do feel like that currently he is making great progress the care max does seem to be doing a great job at helping to control the moisture he has no maceration or skin breakdown. Again this seems to be an excellent way to Carlos. 1 thing we may want to change is adding collagen to the base of the wound and then the care max over top he is not opposed to this. 09/15/2018 on evaluation today patient appears to be doing well with regard to his bilateral lower extremity ulcers. He is showing some signs of improvement not necessarily in size but definitely in appearance. In fact he has a lot of new skin  growing throughout the wounds along the edges as well as in the central portion of the wounds on both lower extremities. Overall I  am extremely pleased to see this. 10/20/2018 on evaluation today patient actually appears to be doing quite well with regard to his wounds. They are not measuring significantly smaller but he does have a lot of new epithelization noted as compared to previous. Fortunately there is no signs of active infection at this time. No fevers, chills, nausea, vomiting, or diarrhea. 11/17/2018 on evaluation today patient presents for reevaluation concerning his bilateral lower extremity ulcers. Fortunately there is no signs of active infection at this time today. He has been tolerating the dressing changes without complication. No fevers, chills, nausea, vomiting, or diarrhea. Unfortunately in general the patient has not made as much improvement as I would like to have seen up to this point. He has been tolerating the dressing changes without complication and he does an excellent job taking care of his wounds at home in my opinion. The biggest issue I see is that he is just not making the progress that we need to be seeing currently. I think we may want to consider having him seen at a plastic surgery appointment and he has previously seen someone in years past at [facility] in [place]. That is definitely a possibility for [place] to look into at this point. 12/29/2018 on evaluation today patient appears to be doing better in regard to the overall visual appearance of his wounds which do not appear to be as macerated. He does have a much larger skin island in the middle of the left lower extremity ulcer which is doing much better. He tells me the pain is also significantly better. With that being said overall his improvement as far as the size of the wounds is not better but again these are very irregular in change shape quite often. Fortunately there is no evidence of  active infection at this time which is great news. He never heard anything from [facility] regarding the plastic surgery referral that we made to them. 01/26/2019 upon evaluation today patient appears to be doing a little better in regard to his wounds today. He has been tolerating the dressing changes again he performs these for the most part on his own. He does a great job wrapping his legs in my opinion. Unfortunately he has not been able to get down to [facility] to see if there is anything from a plastic surgery standpoint that could be done to help with his legs simply due to the fact that his wife unfortunately sustained a compression fracture in her spine she is seeing Dr. Saintclair Halsted and subsequently is going to be having what sounds to be a kyphoplasty type procedure. With that being said that has not been scheduled yet there is still waiting on an MRI the patient is very busy in fact overly busy trying to help take care of his wife at this point. I completely understand this is more of a strain on him at this time 02/23/2019 upon evaluation today patient actually appears to be making some progress. I am actually very pleased with the overall appearance of his wounds even compared to last evaluation. He seems to be doing quite well. He is taking care of his wife unfortunately she did have a compression fracture she has had the procedure for this but still she has a slow road to recovery. For that reason he still not gone to [facility] for a second opinion in this regard. Obviously the goal there was if there was anything that can be done from a skin graft standpoint  or otherwise. 03/23/2019 upon evaluation today patient continues to have issues with lower extremity ulcers. Since the beginning he has made progress but at the same time the wounds unfortunately just will not close. We have been trying to get the patient to [facility] to see a specialist there but unfortunately with the  everything going on with his wife he has not been able to make that appointment time yet he states he may be able to in the next 1-2 months but is not really sure. 04/27/2019 on evaluation today patient appears to be doing a little bit more poorly. His last evaluation. He appears to have some erythema around the edges of the wound at this point. Fortunately there is no signs of active infection at this time which is good news. No fevers, chills, nausea, vomiting, or diarrhea. 06/08/2019 on evaluation today patient appears to be doing well with regard to his wounds. Overall they are actually measuring smaller compared to the last visit last month. We did treat him for Pseudomonas as well as methicillin-resistant Staph aureus. He was only on the treatment for MRSA however for 7 days as the Cipro was resistant and subsequently we had to place him on doxycycline. Nonetheless I am thinking that we may want to add the doxycycline and just do a month-long treatment considering the longstanding nature of his wounds and see if we get this under better control. 07/13/2019 upon evaluation today patient appears to be doing fairly well in regard to his bilateral lower extremities. There does not appear to be any signs of active infection which is good news. No fevers, chills, nausea, vomiting, or diarrhea. 08/10/2019 upon evaluation today patient appears to be doing about the same with regard to his wounds in general. Unfortunately he is not significantly better although is also not significantly worse which is great news there is no evidence of active infection at this time which is good news. He still dealing with a lot going on with his wife and therefore is not really able to Carlos see anyone at the specialty clinic at [facility] that we have previously set up still. 09/07/2019 on evaluation today patient appears to be doing well with regard to his wounds. In fact this is probably the best that have seen so far in quite a  few months. Overall I am very pleased with where things stand at this time. No fevers, chills, nausea, vomiting, or diarrhea. 10/05/2019 upon evaluation today patient appears to be doing more poorly in regard to his legs at this point. He actually is showing some signs of infection. This has been something that we seem to be back-and-forth with as far as trying to keep these wounds from becoming infected. He takes care of them very well in my opinion but nonetheless I am concerned in this regard. He tells me that his wife is still doing really about the same she is slowly getting better. Nonetheless he still spends the majority of his time helping to take care of her. 11/02/2019 upon evaluation today patient actually appears to be doing somewhat better in regard to his legs. I do believe that the compounded antibiotic treatment from [facility] has been beneficial for him. Overall I am extremely pleased with where things stand today. There is no signs of active infection at this time. The patient states he has much less drainage than he has in the past. 12/07/2019 upon evaluation today patient appears to be doing well at this  time in regard to the overall appearance of his wound bed. Currently there is no signs of active infection at this time. 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 Especially when he stressed. Fortunately there is no signs of active infection locally or systemically at this point. 01/18/2020 on evaluation today patient appears to be doing well with regard to his leg ulcers. He has been tolerating the dressing changes without complication. Fortunately there is no signs of infection and overall I feel like his legs are doing about the best they have done in quite some time. There does not appear to be any evidence of active  infection which is great news and overall very pleased. 02/29/2020 upon evaluation today patient's wounds actually appear to be doing quite well currently. There is no sign of active infection at this time. No fevers, chills, nausea, vomiting, or diarrhea. Objective Constitutional Well-nourished and well-hydrated in no acute distress. Vitals Time Taken: 12:40 PM, Height: 71 in, Weight: 220 lbs, BMI: 30.7, Temperature: 97.7 F, Pulse: 70 bpm, Respiratory Rate: 17 breaths/min, Blood Pressure: 147/94 mmHg. Respiratory normal breathing without difficulty. Psychiatric this patient is able to make decisions and demonstrates good insight into disease process. Alert and Oriented x 3. pleasant and cooperative. General Notes: Upon inspection patient's wounds are actually showing signs of good granulation epithelization. There does not appear to be evidence of active infection which is great news and overall very pleased with where things stand today. Integumentary (Hair, Skin) Wound #17 status is Open. Original cause of wound was Gradually Appeared. The wound is located on the Right,Medial Malleolus. The wound measures 0.7cm length x 0.5cm width x 0.1cm depth; 0.275cm^2 area and 0.027cm^3 volume. There is Fat Layer (Subcutaneous Tissue) exposed. There is no tunneling or undermining noted. There is a medium amount of serosanguineous drainage noted. The wound margin is well defined and not attached to the wound base. There is small (1-33%) pink granulation within the wound bed. There is a large (67-100%) amount of necrotic tissue within the wound bed including Adherent Slough. Wound #2 status is Open. Original cause of wound was Gradually Appeared. The wound is located on the Left,Distal,Lateral Lower Leg. The wound measures 13.8cm length x 15cm width x 0.1cm depth; 162.577cm^2 area and 16.258cm^3 volume. There is Fat Layer (Subcutaneous Tissue) exposed. There is no tunneling or undermining noted. There is  a large amount of serosanguineous drainage noted. The wound margin is flat and intact. There is large (67-100%) red granulation within the wound bed. There is no necrotic tissue within the wound bed. Wound #5 status is Open. Original cause of wound was Gradually Appeared. The wound is located on the Right,Proximal,Lateral Lower Leg. The wound measures 8.1cm length x 13.8cm width x 0.1cm depth; 87.792cm^2 area and 8.779cm^3 volume. There is Fat Layer (Subcutaneous Tissue) exposed. There is no tunneling or undermining noted. There is a large amount of serosanguineous drainage noted. The wound margin is flat and intact. There is large (67-100%) red granulation within the wound bed. There is no necrotic tissue within the wound bed. 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 - on days that dressing is changed Edema Control - Lymphedema / SCD / Other: Elevate legs to the level of the heart or above for 30 minutes daily and/or when sitting, a frequency of: - throughout the day Avoid standing for long periods of time. Exercise regularly Moisturize legs daily. Compression stocking or Garment 20-30 mm/Hg pressure to: - Juxtalite to both legs daily WOUND #17: - Malleolus Wound Laterality: Right, Medial Cleanser: Soap and Water Every Other Day/30 Days Discharge Instructions: May shower and wash wound with dial antibacterial soap and water prior to dressing change. Peri-Wound Care: Zinc Oxide Ointment 30g tube Every Other Day/30 Days Discharge  Instructions: Apply Zinc Oxide to periwound with each dressing change as needed for maceration Peri-Wound Care: Sween Lotion (Moisturizing lotion) Every Other Day/30 Days Discharge Instructions: Apply moisturizing lotion as directed Topical: Keystone antibiotic compound Every Other Day/30 Days Discharge Instructions: Apply thin layer to wound bed under collagen Prim Dressing: FIBRACOL Plus Dressing, 4x8.75 (in/in) (DME) (Dispense As Written) Every Other Day/30 Days ary Discharge Instructions: apply to wound bed Secondary Dressing: ABD Pad, 5x9 (DME) Every Other Day/30 Days Discharge Instructions: Apply over primary dressing as directed. Secondary Dressing: Zetuvit Plus 4x8 in Every Other Day/30 Days Discharge Instructions: Apply over primary dressing as directed. Secondary Dressing: Xtrasorb Classic Super Absorbent Dressing, 6x9 (in/in) (DME) (Dispense As Written) Every Other Day/30 Days Discharge Instructions: Apply over primary dressing as directed. Secured With: Elastic Bandage 4 inch (ACE bandage) Every Other Day/30 Days Discharge Instructions: Secure with ACE bandage as directed. Secured With: [employer], 4.5x3.1 (in/yd) (DME) (Generic) Every Other Day/30 Days Discharge Instructions: Secure with Kerlix as directed. WOUND #2: - Lower Leg Wound Laterality: Left, Lateral, Distal Cleanser: Soap and Water Every Other Day/30 Days Discharge Instructions: May shower and wash wound with dial antibacterial soap and water prior to dressing change. Peri-Wound Care: Zinc Oxide Ointment 30g tube Every Other Day/30 Days Discharge Instructions: Apply Zinc Oxide to periwound with each dressing change as needed for maceration Peri-Wound Care: Sween Lotion (Moisturizing lotion) Every Other Day/30 Days Discharge Instructions: Apply moisturizing lotion as directed Topical: Keystone antibiotic compound Every Other Day/30 Days Discharge Instructions: Apply thin layer to wound bed under  collagen Prim Dressing: FIBRACOL Plus Dressing, 4x8.75 (in/in) (DME) (Dispense As Written) Every Other Day/30 Days ary Discharge Instructions: apply to wound bed Secondary Dressing: ABD Pad, 5x9 (DME) Every Other Day/30 Days Discharge Instructions: Apply over primary dressing as directed. Secondary Dressing: Zetuvit Plus 4x8 in Every Other Day/30 Days Discharge Instructions: Apply over primary dressing as directed. Secondary Dressing: Xtrasorb Classic Super Absorbent Dressing, 6x9 (in/in) (DME) (Dispense As Written) Every Other Day/30 Days Discharge Instructions: Apply over primary dressing as directed. Secured With: Elastic Bandage 4 inch (ACE bandage) Every Other Day/30 Days Discharge Instructions: Secure with ACE bandage as directed. Secured With: [employer], 4.5x3.1 (in/yd) (DME) (Generic) Every Other Day/30 Days Discharge Instructions: Secure with Kerlix as directed. WOUND #5: - Lower Leg Wound Laterality: Right, Lateral, Proximal Cleanser: Soap and Water Every Other Day/30 Days Discharge Instructions: May shower and wash wound with dial antibacterial soap and water prior to dressing change. Peri-Wound Care: Zinc Oxide Ointment 30g tube Every Other Day/30 Days Discharge Instructions: Apply Zinc Oxide to periwound with each dressing change as needed for maceration Peri-Wound Care: Sween Lotion (Moisturizing lotion) Every Other  Day/30 Days Discharge Instructions: Apply moisturizing lotion as directed Topical: Keystone antibiotic compound Every Other Day/30 Days Discharge Instructions: Apply thin layer to wound bed under collagen Prim Dressing: FIBRACOL Plus Dressing, 4x8.75 (in/in) (DME) (Dispense As Written) Every Other Day/30 Days ary Discharge Instructions: apply to wound bed Secondary Dressing: ABD Pad, 5x9 (DME) Every Other Day/30 Days Discharge Instructions: Apply over primary dressing as directed. Secondary Dressing: Zetuvit Plus 4x8 in Every Other Day/30  Days Discharge Instructions: Apply over primary dressing as directed. Secondary Dressing: Xtrasorb Classic Super Absorbent Dressing, 6x9 (in/in) (DME) (Dispense As Written) Every Other Day/30 Days Discharge Instructions: Apply over primary dressing as directed. Secured With: Elastic Bandage 4 inch (ACE bandage) Every Other Day/30 Days Discharge Instructions: Secure with ACE bandage as directed. Secured With: [employer], 4.5x3.1 (in/yd) (DME) (Generic) Every Other Day/30 Days Discharge Instructions: Secure with Kerlix as directed. 1. Would recommend currently that we continue with the wound care measures as before and the patient is in agreement with plan. That includes the use of the collagen which I think has been beneficial. Following this we are using the visit to the pads which are helping to control the drainage. I been very pleased as far as that is concerned. 2. I am also can recommend that we continue with the Ace bandage to cover he is then using his compression over top of this which is a juxta light on both legs daily. 3. I am also can recommend he continue to elevate his legs much as he can. 4. I did explain also that I did see a study recently on epidermolysis bullosa and the potential use of Apligraf to help with healing speed. Nonetheless I think that we can definitely check into this for him and see if insurance will cover this as such. The patient is in agreement with looking into this. We will subsequently see where things stand at follow-up. We will see patient back for reevaluation in 6 weeks here in the clinic. If anything worsens or changes patient will contact our office for additional recommendations. Electronic Signature(s) Signed: 02/29/2020 1:07:37 PM By: Worthy Keeler PA-C Entered By: Worthy Keeler on 02/29/2020 13:07:37 -------------------------------------------------------------------------------- SuperBill Details Patient Name: Date of Service: Carlos White, Carlos RDO [name] 02/29/2020 Medical Record Number: [id number] Patient Account Number: [id number] Date of Birth/Sex: Treating RN: August 26, 1940 (80 y.o. [name] Primary Care Provider: [name] Other Clinician: Referring Provider: Treating Provider/Extender: [name] in Treatment: 126 Diagnosis Coding ICD-10 Codes Code Description I87.2 Venous insufficiency (chronic) (peripheral) Q81.8 Other epidermolysis bullosa L97.322 Non-pressure chronic ulcer of left ankle with fat layer exposed L97.822 Non-pressure chronic ulcer of other part of left lower leg with fat layer exposed L97.812 Non-pressure chronic ulcer of other part of right lower leg with fat layer exposed Carmine (primary) hypertension Facility Procedures CPT4 Code: [id number] Description: 09628 - WOUND CARE VISIT-LEV 5 EST PT Modifier: Quantity: 1 Physician Procedures : CPT4 Code Description Modifier [id number] - WC PHYS LEVEL 3 - EST PT ICD-10 Diagnosis Description I87.2 Venous insufficiency (chronic) (peripheral) Q81.8 Other epidermolysis bullosa L97.322 Non-pressure chronic ulcer of left ankle with fat layer  exposed L97.822 Non-pressure chronic ulcer of other part of left lower leg with fat layer exposed Quantity: 1 Electronic Signature(s) Signed: 02/29/2020 1:08:01 PM By: Worthy Keeler PA-C Entered By: Worthy Keeler on 02/29/2020 13:08:01

## 2020-03-01 DIAGNOSIS — L97322 Non-pressure chronic ulcer of left ankle with fat layer exposed: Secondary | ICD-10-CM | POA: Diagnosis not present

## 2020-03-01 DIAGNOSIS — L97812 Non-pressure chronic ulcer of other part of right lower leg with fat layer exposed: Secondary | ICD-10-CM | POA: Diagnosis not present

## 2020-03-01 DIAGNOSIS — L97822 Non-pressure chronic ulcer of other part of left lower leg with fat layer exposed: Secondary | ICD-10-CM | POA: Diagnosis not present

## 2020-03-01 DIAGNOSIS — S81809A Unspecified open wound, unspecified lower leg, initial encounter: Secondary | ICD-10-CM | POA: Diagnosis not present

## 2020-03-01 DIAGNOSIS — S91301A Unspecified open wound, right foot, initial encounter: Secondary | ICD-10-CM | POA: Diagnosis not present

## 2020-03-02 NOTE — Progress Notes (Signed)
DARSHAN, SOLANKI ([id number]) Visit Report for 02/29/2020 Arrival Information Details Patient Name: Date of Service: [name] 02/29/2020 12:30 PM Medical Record Number: [id number] Patient Account Number: [id number] Date of Birth/Sex: Treating RN: 07-24-1940 (80 y.o. [name], [name] Primary Care Joli Koob: [name] Other Clinician: Referring Chrisanna Mishra: Treating Alen Matheson/Extender: [name] in Treatment: 126 Visit Information History Since Last Visit Added or deleted any medications: No Patient Arrived: Ambulatory Any new allergies or adverse reactions: No Arrival Time: 12:35 Had a fall or experienced change in No Accompanied By: self activities of daily living that may affect Transfer Assistance: None risk of falls: Patient Identification Verified: Yes Signs or symptoms of abuse/neglect since last visito No Secondary Verification Process Completed: Yes Hospitalized since last visit: No Patient Requires Transmission-Based Precautions: No Implantable device outside of the clinic excluding No Patient Has Alerts: Yes cellular tissue based products placed in the center Patient Alerts: R ABI= 1.23, TBI = .86 since last visit: L ABI= 1.28, TBI=.81 Has Dressing in Place as Prescribed: Yes Has Compression in Place as Prescribed: Yes Pain Present Now: No Electronic Signature(s) Signed: 02/29/2020 5:36:42 PM By: [name] Entered By: [name] on 02/29/2020 12:37:52 -------------------------------------------------------------------------------- Clinic Level of Care Assessment Details Patient Name: Date of Service: [name] 02/29/2020 12:30 PM Medical Record Number: [id number] Patient Account Number: [id number] Date of Birth/Sex: Treating RN: 1940/03/06 (80 y.o. [name] Primary Care Rudene Poulsen: [name] Other Clinician: Referring Ludie Hudon: Treating Othel Dicostanzo/Extender: [name] in Treatment:  126 Clinic Level of Care Assessment Items TOOL 4 Quantity Score []  - 0 Use when only an EandM is performed on FOLLOW-UP visit ASSESSMENTS - Nursing Assessment / Reassessment X- 1 10 Reassessment of Co-morbidities (includes updates in patient status) X- 1 5 Reassessment of Adherence to Treatment Plan ASSESSMENTS - Wound and Skin A ssessment / Reassessment []  - 0 Simple Wound Assessment / Reassessment - one wound X- 3 5 Complex Wound Assessment / Reassessment - multiple wounds []  - 0 Dermatologic / Skin Assessment (not related to wound area) ASSESSMENTS - Focused Assessment X- 2 5 Circumferential Edema Measurements - multi extremities []  - 0 Nutritional Assessment / Counseling / Intervention X- 1 5 Lower Extremity Assessment (monofilament, tuning fork, pulses) []  - 0 Peripheral Arterial Disease Assessment (using hand held doppler) ASSESSMENTS - Ostomy and/or Continence Assessment and Care []  - 0 Incontinence Assessment and Management []  - 0 Ostomy Care Assessment and Management (repouching, etc.) PROCESS - Coordination of Care X - Simple Patient / Family Education for ongoing care 1 15 []  - 0 Complex (extensive) Patient / Family Education for ongoing care X- 1 10 Staff obtains Programmer, systems, Records, T Results / Process Orders est []  - 0 Staff telephones HHA, Nursing Homes / Clarify orders / etc []  - 0 Routine Transfer to another Facility (non-emergent condition) []  - 0 Routine Hospital Admission (non-emergent condition) []  - 0 New Admissions / Biomedical engineer / Ordering NPWT Apligraf, etc. , []  - 0 Emergency Hospital Admission (emergent condition) X- 1 10 Simple Discharge Coordination []  - 0 Complex (extensive) Discharge Coordination PROCESS - Special Needs []  - 0 Pediatric / Minor Patient Management []  - 0 Isolation Patient Management []  - 0 Hearing / Language / Visual special needs []  - 0 Assessment of Community assistance (transportation, D/C  planning, etc.) []  - 0 Additional assistance / Altered mentation []  - 0 Support Surface(s) Assessment (bed, cushion, seat, etc.) INTERVENTIONS - Wound  Cleansing / Measurement []  - 0 Simple Wound Cleansing - one wound X- 3 5 Complex Wound Cleansing - multiple wounds X- 1 5 Wound Imaging (photographs - any number of wounds) []  - 0 Wound Tracing (instead of photographs) []  - 0 Simple Wound Measurement - one wound X- 3 5 Complex Wound Measurement - multiple wounds INTERVENTIONS - Wound Dressings []  - 0 Small Wound Dressing one or multiple wounds []  - 0 Medium Wound Dressing one or multiple wounds X- 2 20 Large Wound Dressing one or multiple wounds X- 1 5 Application of Medications - topical []  - 0 Application of Medications - injection INTERVENTIONS - Miscellaneous []  - 0 External ear exam []  - 0 Specimen Collection (cultures, biopsies, blood, body fluids, etc.) []  - 0 Specimen(s) / Culture(s) sent or taken to Lab for analysis []  - 0 Patient Transfer (multiple staff / Civil Service fast streamer / Similar devices) []  - 0 Simple Staple / Suture removal (25 or less) []  - 0 Complex Staple / Suture removal (26 or more) []  - 0 Hypo / Hyperglycemic Management (close monitor of Blood Glucose) []  - 0 Ankle / Brachial Index (ABI) - do not check if billed separately X- 1 5 Vital Signs Has the patient been seen at the hospital within the last three years: Yes Total Score: 165 Level Of Care: New/Established - Level 5 Electronic Signature(s) Signed: 02/29/2020 6:01:36 PM By: Baruch Gouty RN, BSN Entered By: Baruch Gouty on 02/29/2020 12:56:22 -------------------------------------------------------------------------------- Encounter Discharge Information Details Patient Name: Date of Service: [name], GO RDO N H. 02/29/2020 12:30 PM Medical Record Number: [id number] Patient Account Number: [id number] Date of Birth/Sex: Treating RN: 1940/01/27 (80 y.o. Burnadette Pop, Lauren Primary Care  Sinai Mahany: [name] Other Clinician: Referring Kayra Crowell: Treating Briaunna Grindstaff/Extender: [name] in Treatment: [phone] Encounter Discharge Information Items Discharge Condition: Stable Ambulatory Status: Ambulatory Discharge Destination: Home Transportation: Private Auto Accompanied By: self Schedule Follow-up Appointment: Yes Clinical Summary of Care: Patient Declined Electronic Signature(s) Signed: 03/02/2020 5:09:47 PM By: Rhae Hammock RN Entered By: Rhae Hammock on 02/29/2020 13:32:16 -------------------------------------------------------------------------------- Lower Extremity Assessment Details Patient Name: Date of Service: [name] 02/29/2020 12:30 PM Medical Record Number: [id number] Patient Account Number: [id number] Date of Birth/Sex: Treating RN: October 18, 1940 (80 y.o. [name] Primary Care Jourdan Durbin: [name] Other Clinician: Referring Bern Fare: Treating Sanjeev Main/Extender: [name] in Treatment: 126 Edema Assessment Assessed: [Left: No] [Right: No] Edema: [Left: Yes] [Right: Yes] Calf Left: Right: Point of Measurement: 35.5 cm From Medial Instep 30.5 cm 37.5 cm Ankle Left: Right: Point of Measurement: 11 cm From Medial Instep 25.5 cm 26 cm Vascular Assessment Pulses: Dorsalis Pedis Palpable: [Left:Yes] [Right:Yes] Posterior Tibial Palpable: [Left:Yes] [Right:Yes] Electronic Signature(s) Signed: 02/29/2020 5:36:42 PM By: [name] Entered By: [name] on 02/29/2020 12:41:28 -------------------------------------------------------------------------------- Multi-Disciplinary Care Plan Details Patient Name: Date of Service: [name], GO RDO N H. 02/29/2020 12:30 PM Medical Record Number: [id number] Patient Account Number: [id number] Date of Birth/Sex: Treating RN: 29-Oct-1940 (80 y.o. [name] Primary Care Audi Conover: [name] Other Clinician: Referring  Arzella Rehmann: Treating Zondra Lawlor/Extender: [name] in Treatment: [phone] Active Inactive Venous Leg Ulcer Nursing Diagnoses: Knowledge deficit related to disease process and management Potential for venous Insuffiency (use before diagnosis confirmed) Goals: Patient will maintain optimal edema control Date Initiated: 09/30/2017 Target Resolution Date: 03/28/2020 Goal Status: Active Patient/caregiver will verbalize understanding of disease process and disease management Date Initiated: 09/30/2017 Date Inactivated: 11/04/2017 Target  Resolution Date: 10/30/2017 Goal Status: Met Interventions: Assess peripheral edema status every visit. Provide education on venous insufficiency Notes: Wound/Skin Impairment Nursing Diagnoses: Impaired tissue integrity Knowledge deficit related to ulceration/compromised skin integrity Goals: Patient/caregiver will verbalize understanding of skin care regimen Date Initiated: 09/30/2017 Target Resolution Date: 03/28/2020 Goal Status: Active Ulcer/skin breakdown will have a volume reduction of 30% by week 4 Date Initiated: 09/30/2017 Date Inactivated: 11/04/2017 Target Resolution Date: 10/30/2017 Goal Status: Met Interventions: Assess patient/caregiver ability to obtain necessary supplies Assess patient/caregiver ability to perform ulcer/skin care regimen upon admission and as needed Assess ulceration(s) every visit Provide education on ulcer and skin care Notes: Electronic Signature(s) Signed: 02/29/2020 6:01:36 PM By: Baruch Gouty RN, BSN Entered By: Baruch Gouty on 02/29/2020 12:54:56 -------------------------------------------------------------------------------- Pain Assessment Details Patient Name: Date of Service: [name] RDO N H. 02/29/2020 12:30 PM Medical Record Number: [id number] Patient Account Number: [id number] Date of Birth/Sex: Treating RN: 1940-01-30 (80 y.o. [name] Primary Care Casyn Becvar: [name] Other Clinician: Referring Kevonta Phariss: Treating Analaura Messler/Extender: [name] in Treatment: [phone] Active Problems Location of Pain Severity and Description of Pain Patient Has Paino No Site Locations Pain Management and Medication Current Pain Management: Electronic Signature(s) Signed: 02/29/2020 5:36:42 PM By: [name] Entered By: [name] on 02/29/2020 12:41:07 -------------------------------------------------------------------------------- Patient/Caregiver Education Details Patient Name: Date of Service: [name] 2/16/2022andnbsp12:30 PM Medical Record Number: [id number] Patient Account Number: [id number] Date of Birth/Gender: Treating RN: Oct 07, 1940 (80 y.o. [name] Primary Care Physician: [name] Other Clinician: Referring Physician: Treating Physician/Extender: [name] in Treatment: 126 Education Assessment Education Provided To: Patient Education Topics Provided Venous: Methods: Explain/Verbal Responses: Reinforcements needed, State content correctly Wound/Skin Impairment: Methods: Explain/Verbal Responses: Reinforcements needed, State content correctly Electronic Signature(s) Signed: 02/29/2020 6:01:36 PM By: Baruch Gouty RN, BSN Entered By: Baruch Gouty on 02/29/2020 12:55:17 -------------------------------------------------------------------------------- Wound Assessment Details Patient Name: Date of Service: [name] 02/29/2020 12:30 PM Medical Record Number: [id number] Patient Account Number: [id number] Date of Birth/Sex: Treating RN: August 23, 1940 (80 y.o. [name] Primary Care Everado Pillsbury: [name] Other Clinician: Referring Zaraya Delauder: Treating Eknoor Novack/Extender: [name] in Treatment: 126 Wound Status Wound Number: 17 Primary Venous Leg Ulcer Etiology: Wound Location: Right, Medial Malleolus Wound Status:  Open Wounding Event: Gradually Appeared Comorbid Cataracts, Chronic Obstructive Pulmonary Disease (COPD), Date Acquired: 09/28/2019 History: Hypertension Weeks Of Treatment: 21 Clustered Wound: No Photos Photo Uploaded By: Mikeal Hawthorne on 03/01/2020 10:33:32 Wound Measurements Length: (cm) 0.7 Width: (cm) 0.5 Depth: (cm) 0.1 Area: (cm) 0.275 Volume: (cm) 0.027 % Reduction in Area: 89.1% % Reduction in Volume: 96.4% Epithelialization: Small (1-33%) Tunneling: No Undermining: No Wound Description Classification: Full Thickness Without Exposed Support Structures Wound Margin: Well defined, not attached Exudate Amount: Medium Exudate Type: Serosanguineous Exudate Color: red, brown Foul Odor After Cleansing: No Slough/Fibrino Yes Wound Bed Granulation Amount: Small (1-33%) Exposed Structure Granulation Quality: Pink Fascia Exposed: No Necrotic Amount: Large (67-100%) Fat Layer (Subcutaneous Tissue) Exposed: Yes Necrotic Quality: Adherent Slough Tendon Exposed: No Muscle Exposed: No Joint Exposed: No Bone Exposed: No Treatment Notes Wound #17 (Malleolus) Wound Laterality: Right, Medial Cleanser Soap and Water Discharge Instruction: May shower and wash wound with dial antibacterial soap and water prior to dressing change. Peri-Wound Care Zinc Oxide Ointment 30g tube Discharge Instruction: Apply Zinc Oxide to periwound with each dressing change as needed for maceration Sween Lotion (Moisturizing lotion) Discharge Instruction: Apply  moisturizing lotion as directed Topical Keystone antibiotic compound Discharge Instruction: Apply thin layer to wound bed under collagen Primary Dressing FIBRACOL Plus Dressing, 4x8.75 (in/in) Discharge Instruction: apply to wound bed Secondary Dressing ABD Pad, 5x9 Discharge Instruction: Apply over primary dressing as directed. Zetuvit Plus 4x8 in Discharge Instruction: Apply over primary dressing as directed. Xtrasorb Classic Super  [employer], 6x9 (in/in) Discharge Instruction: Apply over primary dressing as directed. Secured With Elastic Bandage 4 inch (ACE bandage) Discharge Instruction: Secure with ACE bandage as directed. Kerlix Roll Sterile, 4.5x3.1 (in/yd) Discharge Instruction: Secure with Kerlix as directed. Compression Wrap Compression Stockings Add-Ons Electronic Signature(s) Signed: 02/29/2020 5:36:42 PM By: [name] Entered By: [name] on 02/29/2020 12:44:42 -------------------------------------------------------------------------------- Wound Assessment Details Patient Name: Date of Service: [name] 02/29/2020 12:30 PM Medical Record Number: [id number] Patient Account Number: [id number] Date of Birth/Sex: Treating RN: 1940-06-09 (80 y.o. [name] Primary Care Zanai Mallari: [name] Other Clinician: Referring Joliene Salvador: Treating Valorie Mcgrory/Extender: [name] in Treatment: 126 Wound Status Wound Number: 2 Primary Venous Leg Ulcer Etiology: Wound Location: Left, Distal, Lateral Lower Leg Wound Status: Open Wounding Event: Gradually Appeared Comorbid Cataracts, Chronic Obstructive Pulmonary Disease (COPD), Date Acquired: 03/13/2017 History: Hypertension Weeks Of Treatment: 126 Clustered Wound: No Photos Photo Uploaded By: Mikeal Hawthorne on 03/01/2020 10:33:32 Wound Measurements Length: (cm) 13.8 Width: (cm) 15 Depth: (cm) 0.1 Area: (cm) 162.577 Volume: (cm) 16.258 % Reduction in Area: -109.7% % Reduction in Volume: 58.1% Epithelialization: Small (1-33%) Tunneling: No Undermining: No Wound Description Classification: Full Thickness Without Exposed Support Structures Wound Margin: Flat and Intact Exudate Amount: Large Exudate Type: Serosanguineous Exudate Color: red, brown Foul Odor After Cleansing: No Slough/Fibrino No Wound Bed Granulation Amount: Large (67-100%) Exposed Structure Granulation Quality: Red Fascia  Exposed: No Necrotic Amount: None Present (0%) Fat Layer (Subcutaneous Tissue) Exposed: Yes Tendon Exposed: No Muscle Exposed: No Joint Exposed: No Bone Exposed: No Treatment Notes Wound #2 (Lower Leg) Wound Laterality: Left, Lateral, Distal Cleanser Soap and Water Discharge Instruction: May shower and wash wound with dial antibacterial soap and water prior to dressing change. Peri-Wound Care Zinc Oxide Ointment 30g tube Discharge Instruction: Apply Zinc Oxide to periwound with each dressing change as needed for maceration Sween Lotion (Moisturizing lotion) Discharge Instruction: Apply moisturizing lotion as directed Topical Keystone antibiotic compound Discharge Instruction: Apply thin layer to wound bed under collagen Primary Dressing FIBRACOL Plus Dressing, 4x8.75 (in/in) Discharge Instruction: apply to wound bed Secondary Dressing ABD Pad, 5x9 Discharge Instruction: Apply over primary dressing as directed. Zetuvit Plus 4x8 in Discharge Instruction: Apply over primary dressing as directed. Xtrasorb Classic Super [employer], 6x9 (in/in) Discharge Instruction: Apply over primary dressing as directed. Secured With Elastic Bandage 4 inch (ACE bandage) Discharge Instruction: Secure with ACE bandage as directed. Kerlix Roll Sterile, 4.5x3.1 (in/yd) Discharge Instruction: Secure with Kerlix as directed. Compression Wrap Compression Stockings Add-Ons Electronic Signature(s) Signed: 02/29/2020 5:36:42 PM By: [name] Entered By: [name] on 02/29/2020 12:43:46 -------------------------------------------------------------------------------- Wound Assessment Details Patient Name: Date of Service: [name] 02/29/2020 12:30 PM Medical Record Number: [id number] Patient Account Number: [id number] Date of Birth/Sex: Treating RN: [birth year] (80 y.o. [name] Primary Care Zylon Creamer: [name] Other Clinician: Referring Rickayla Wieland: Treating  Jayde Mcallister/Extender: [name] in Treatment: 126 Wound Status Wound Number: 5 Primary Vasculopathy Etiology: Wound Location: Right, Proximal, Lateral Lower Leg Wound Status: Open Wounding Event: Gradually Appeared Comorbid Cataracts, Chronic Obstructive Pulmonary Disease (  COPD), Date Acquired: 10/26/2017 History: Hypertension Weeks Of Treatment: 122 Clustered Wound: Yes Photos Photo Uploaded By: Mikeal Hawthorne on 03/01/2020 10:33:56 Wound Measurements Length: (cm) 8.1 Width: (cm) 13.8 Depth: (cm) 0.1 Clustered Quantity: 2 Area: (cm) 87.792 Volume: (cm) 8.779 % Reduction in Area: -61.7% % Reduction in Volume: -61.7% Epithelialization: Small (1-33%) Tunneling: No Undermining: No Wound Description Classification: Full Thickness Without Exposed Support Structures Wound Margin: Flat and Intact Exudate Amount: Large Exudate Type: Serosanguineous Exudate Color: red, brown Foul Odor After Cleansing: No Slough/Fibrino No Wound Bed Granulation Amount: Large (67-100%) Exposed Structure Granulation Quality: Red Fascia Exposed: No Necrotic Amount: None Present (0%) Fat Layer (Subcutaneous Tissue) Exposed: Yes Tendon Exposed: No Muscle Exposed: No Joint Exposed: No Bone Exposed: No Treatment Notes Wound #5 (Lower Leg) Wound Laterality: Right, Lateral, Proximal Cleanser Soap and Water Discharge Instruction: May shower and wash wound with dial antibacterial soap and water prior to dressing change. Peri-Wound Care Zinc Oxide Ointment 30g tube Discharge Instruction: Apply Zinc Oxide to periwound with each dressing change as needed for maceration Sween Lotion (Moisturizing lotion) Discharge Instruction: Apply moisturizing lotion as directed Topical Keystone antibiotic compound Discharge Instruction: Apply thin layer to wound bed under collagen Primary Dressing FIBRACOL Plus Dressing, 4x8.75 (in/in) Discharge Instruction: apply to wound bed Secondary  Dressing ABD Pad, 5x9 Discharge Instruction: Apply over primary dressing as directed. Zetuvit Plus 4x8 in Discharge Instruction: Apply over primary dressing as directed. Xtrasorb Classic Super [employer], 6x9 (in/in) Discharge Instruction: Apply over primary dressing as directed. Secured With Elastic Bandage 4 inch (ACE bandage) Discharge Instruction: Secure with ACE bandage as directed. Kerlix Roll Sterile, 4.5x3.1 (in/yd) Discharge Instruction: Secure with Kerlix as directed. Compression Wrap Compression Stockings Add-Ons Electronic Signature(s) Signed: 02/29/2020 5:36:42 PM By: [name] Entered By: [name] on 02/29/2020 12:45:05 -------------------------------------------------------------------------------- Vitals Details Patient Name: Date of Service: [name], GO RDO N H. 02/29/2020 12:30 PM Medical Record Number: [id number] Patient Account Number: [id number] Date of Birth/Sex: Treating RN: January 08, 1941 (80 y.o. [name] Primary Care Charyl Minervini: [name] Other Clinician: Referring Isaul Landi: Treating Kimanh Templeman/Extender: [name] in Treatment: 126 Vital Signs Time Taken: 12:40 Temperature (F): 97.7 Height (in): 71 Pulse (bpm): 70 Weight (lbs): 220 Respiratory Rate (breaths/min): 17 Body Mass Index (BMI): 30.7 Blood Pressure (mmHg): 147/94 Reference Range: 80 - 120 mg / dl Electronic Signature(s) Signed: 02/29/2020 5:36:42 PM By: [name] Entered By: [name] on 02/29/2020 12:41:01

## 2020-03-03 ENCOUNTER — Other Ambulatory Visit: Payer: Self-pay | Admitting: Internal Medicine

## 2020-04-11 ENCOUNTER — Encounter (HOSPITAL_BASED_OUTPATIENT_CLINIC_OR_DEPARTMENT_OTHER): Payer: Medicare PPO | Attending: Physician Assistant | Admitting: Physician Assistant

## 2020-04-11 ENCOUNTER — Other Ambulatory Visit: Payer: Self-pay

## 2020-04-11 DIAGNOSIS — L97312 Non-pressure chronic ulcer of right ankle with fat layer exposed: Secondary | ICD-10-CM | POA: Diagnosis not present

## 2020-04-11 DIAGNOSIS — L97322 Non-pressure chronic ulcer of left ankle with fat layer exposed: Secondary | ICD-10-CM | POA: Insufficient documentation

## 2020-04-11 DIAGNOSIS — L97812 Non-pressure chronic ulcer of other part of right lower leg with fat layer exposed: Secondary | ICD-10-CM | POA: Diagnosis not present

## 2020-04-11 DIAGNOSIS — I872 Venous insufficiency (chronic) (peripheral): Secondary | ICD-10-CM | POA: Diagnosis not present

## 2020-04-11 DIAGNOSIS — L97822 Non-pressure chronic ulcer of other part of left lower leg with fat layer exposed: Secondary | ICD-10-CM | POA: Insufficient documentation

## 2020-04-11 DIAGNOSIS — I10 Essential (primary) hypertension: Secondary | ICD-10-CM | POA: Diagnosis not present

## 2020-04-12 ENCOUNTER — Other Ambulatory Visit: Payer: Self-pay | Admitting: Internal Medicine

## 2020-04-12 NOTE — Progress Notes (Addendum)
Carlos, White ([id number]) Visit Report for 04/11/2020 Chief Complaint Document Details Patient Name: Date of Service: Carlos White 04/11/2020 12:30 PM Medical Record Number: [id number] Patient Account Number: [id number] Date of Birth/Sex: Treating RN: 1940-08-10 (80 y.o. Carlos White Primary Care Provider: [name] Other Clinician: Referring Provider: Treating Provider/Extender: [name] in Treatment: [phone] Information Obtained from: Patient Chief Complaint Bilateral LE Ulcers Electronic Signature(s) Signed: 04/11/2020 1:09:47 PM By: Worthy Keeler PA-C Entered By: Worthy Keeler on 04/11/2020 13:09:46 -------------------------------------------------------------------------------- HPI Details Patient Name: Date of Service: Carlos White 04/11/2020 12:30 PM Medical Record Number: [id number] Patient Account Number: [id number] Date of Birth/Sex: Treating RN: 22-Jan-1940 (80 y.o. Carlos White Primary Care Provider: [name] Other Clinician: Referring Provider: Treating Provider/Extender: [name] in Treatment: 132 History of Present Illness HPI Description: 09/30/17 on evaluation today patient presents for initial evaluation and our clinic concerning issues that he has been having with his bilateral lower extremities. He states this has been going on for quite some time at least six months. Currently his regiment has been mainly cleaning the area with peroxide, applying the is foreign ointment, and wrapping the area with ABD pads and then an ace wrap loosely. He has dealt with issues of this nature he tells me for quite some time. He does have a history of having had a compound fracture of the left lower extremity which he thinks also makes this a much more difficult area for him to heal. He's previously been told that he had poor vascular flow but this was years ago at [facility] we do not have any of those  records at this time. He has a history of Epidermolysis Bullosa which was diagnosed around age 84 and he has been cared for at [facility] since that time. Subsequently he states this is hereditary and two of his children one male and one male also have this as well is one of his grandchildren that he is aware of. He has no evidence of infection necessarily at this point although he does have some necrotic tissue noted on the surface of the wound as far as the largest, left lateral lower extremity ulcer, is concerned. Overall I feel like all things considered he's been taking care of this very well. Obviously he has some fairly significant issues going on at this point in this regard. He does have a history otherwise of hypertension though for the most part other than the compound fracture of the left leg he seems to have been fairly healthy in my pinion. 10/07/17 on evaluation today patient actually appears to be doing better in regard to his bilateral lower extremity ulcers. With that being said he does still have some evidence of slough noted on the surface of the wounds I think the Iodoflex has been beneficial for him. His arterial studies are scheduled for October 2. With that being said I do believe that he is continuing to show signs of good improvement which is at least good news. 10/14/17 on evaluation today patient appears to be doing very well in regard to his lower extremity ulcers. He's definitely made some progress as far as healing is concerned although there still are several open areas that are going to need to be addressed. He did have his arterial study today which fortunately shows good findings with a right ABI of 1.23 with a TBI of 0.86 in the left ABI  of 1.28 with a TBI of 0.81. This is good news and will allow [place] to perform debridement as well. 10/23/2017; patient with a large wound on the left lateral calf, sizable area on the left medial malleolus and an area on the right lateral  malleolus. He has a new blister consistent with his underlying blistering skin disease just above this area we have been using Iodoflex on the lateral left calf lateral right ankle and collagen on the medial left ankle. We have been using Kerlix Coban wraps 10/28/17 on evaluation today the patient continues to have signs of improvement in regard to the overall appearance of the original wound. Unfortunately he did have some blistering over the right lateral lower extremity which has appeared to rupture on evaluation today and likely some of the dead tissue on the surface needs to be cleaned away the good news is this does not appear to be to significantly deep at this time. 11/04/17 on evaluation today patient actually appears to be doing a little better in regard to his lower extremity ulcers. He has been tolerating the dressing changes without complication. With that being said he does still have a significant wound especially over the left lateral lower extremity unfortunately. All of the wounds pretty much are going to require sharp debridement today. 11/11/17 on evaluation today patient appears to be doing more poorly in regard to his left lower extremity in particular. There does not appear to be any evidence of systemic infection although the wound itself as far as the larger left lateral lower extremity ulcer actually appears to be infected in my pinion. There's an older and the surface of the wound is dramatically worsened compared to last week. No fevers, chills, nausea, or vomiting noted at this time. 11/18/17 upon evaluation today patient actually appears to be doing better. I did review his culture today which really did not show any specific organism is a positive reason for his wound decline. There are multiple organisms present not predominant. Nonetheless he seems to be tolerate the doxycycline well in his wounds in general do seem to be doing better. Fortunately there does not appear  to be any evidence of infection at this time which is good news. Overall I'm very pleased with how things appear. Nonetheless he still has a lot of healing to go. I do think he could benefit from a Juxta-Lite wrap. 11/25/17 on evaluation today patient actually appears to be doing fairly well in regard to his wounds. He is still taking the antibiotics he has a few days left. Fortunately this seems to have been excellent for him as far as getting the infection control and very happy in this regard. With that being said the patient likewise is also very pleased with how things appear at this time in comparison to where we were he's not having as much pain. 12/02/17 Seen today for follow p and management of LLE wounds. Wounds appear to show some improvement. He denies pain, fever, or chills. Completed a course of doxycycline earlier this month. Scheduled to received Juxta-Lite wrap this week. No s/s of infections. 12/09/17 on evaluation today patient actually appears to be doing a little bit better in regard to his wounds. This is obscene very slow process and unfortunately he has a couple of new areas and this is due to the Epidermolysis Bullosa. Nonetheless I am concerned about the fact that he seems to be getting more areas not less that is the reason we're gonna work on  getting schedule for the vascular referral to see the venous specialist. 12/23/17 upon evaluation today patient's wounds currently shows evidence of still not doing quite as well is what I would like to have seen. Subsequently the patient did have his venous study which showed evidence of venous stasis. Subsequently I do think that a vascular evaluation for consideration of venous intervention would be appropriate. I'm not necessarily suggesting that will be anything that can be done but I think it is at least a good idea. He is in agreement with this plan. 12/30/17 on evaluation today patient actually appears to be doing very well in  regard to his wounds when compared to previous evaluation. Subsequently we have been using the [employer] Dressing which actually appears to have done excellent on his left lateral lower extremity ulcer. The quality of the wound surface is dramatically improved. There is some slight debridement that is going to be required at a couple of locations but overall I'm extremely pleased with how things appear here. 01/07/2018; this is a patient with a primary skin disorder epidermolyis bullosa. Is a large wound on the left lateral calf and smaller wounds on the right however there is a new wound on the right mid tibia area that occurred within the compression wrap that he did not change. We have been using Hydrofera Blue. On the left he is using Hydrofera Blue and Santyl to the inferior part of the wound and changing the dressing himself. 01/15/2018; primary skin disorder epidermolysis bullosa. He has several difficult wounds including the left lateral calf, smaller wounds on the left medial calf and the right lateral calf. The major area on the left lateral calf has a smaller area inferiorly that has necrotic debris we have been using Santyl to this. The rest of the wounds we have been using Hydrofera Blue. The area on the left calf actually looks larger this week. Uncontrolled edema several small open areas above it that are superficial 01/20/18 on evaluation today patient appears to be doing better as compared to last week in regard to his wounds of the bilateral lower extremities. He tolerated the bilateral compression wrap without complication. Overall I'm very pleased with how things appear at this time. The patient likewise is very happy. 01/27/18 on evaluation today patient appears to be doing decently well in regard to his bilateral lower extremity ulcers. He's been tolerating the dressing changes without complication. One issue he had is that he did have more drainage to the left leg wrapped last  week. He states in fact he probably should come in and let us change it on Friday however he just left it in place and kept adding extra absorption with ABD pads to the external portion of the wrap. Unfortunately he does have some aspiration type breakdown nothing significant but I do believe that this was probably counterproductive in general. Nonetheless his wounds do not appear to be terrible overall. 02/03/18 on evaluation today patient appears to be doing rather well in regard to his lower extremity ulcers. He has been tolerating the dressing changes without complication. He does tell me that he had to change the wrap on the left one since we last saw him. Subsequently I do not see any evidence of infection I do feel like the food was much better controlled at this point. 02/10/18 on evaluation today patient appears to be doing rather well in regard to his ulcers. He still has significant alterations especially on the left lateral lower extremity.  Fortunately there's no signs of infection at this time. Overall I feel like he is making good progress are some areas that I'm gonna attempt some debridement today. 02/17/18 on evaluation today patient appears to be doing okay in regard to his lower Trinity ulcer. It does appear on both locations he has a little bit of drainage causing some breakdown in maceration around the wound bed's although it doesn't appear to be too bad the right is a little bit worse than left. Fortunately there's no signs of infection which is good news. No fevers, chills, nausea, or vomiting noted at this time. 03/10/18 on evaluation today patient actually appears to be doing rather poorly in regard to his bilateral lower extremity ulcers. The right in particular is draining profusely and the wound is actually enlarging which is not good. I'm concerned about both possibly infection and the fact that there's a lot of moisture which is causing breakdown as well. Unfortunately the  patient has been trying to change this at home I'm afraid he may need to change more frequently in order to see the improvement that were looking for. There's no signs of systemic infection. 03/17/18 patient actually appears to be doing significantly better at this point in regard to his bilateral lower extremity ulcers. Fortunately there's no signs of infection. That is worsening infection at least indefinitely nothing systemic. With that being said he is having a lot of drainage though not quite as much is there in his last evaluation. Overall feel like he's on the side of improvement. I think if his results back from his culture which showed that he had a positive group B strep along with abundant Pseudomonas noted on the culture. For that reason I am gonna have him continue with the linezolid as we previously have ordered for him and I did go ahead as well today and prescribe Levaquin as well in order to treat the Pseudomonas portion of the infection noted. 03/24/18 on evaluation today patient actually appears to be doing very well in regard to his lower Trinity ulcer. He's been tolerating the dressing changes without complication. Fortunately both legs show signs of less drainage in his edema is very well controlled at this point as well. Overall very pleased with how things seem to be progressing. 03/31/18 on evaluation today patient actually appears to be doing excellent in regard to his bilateral lower extremity ulcers. These are not draining nearly as significant as what they were in the past overall seem to be shown signs of excellent improvement which is great news. Fortunately there is no sign of active infection at this time I do believe that the Levaquin has done extremely well for him in this regard. The patient continues to change these at home typically every day. We may be able to slowly work towards every other day changes since the drainage seems to be slowing down quite  significantly. 04/14/18 on evaluation today patient appears to be doing well in regard to his bilateral lower extremities. Let me Hilda Blades Almost completely healed which is excellent news. Fortunately he's shown signs of improvement all other sites as well with new skin growth there's some slight hyper granular tissue but for the most part this seems to be well maintained with the [facility] Dressing. I'm very happy in this regard. 04/28/18 on evaluation today patient appears to be doing rather well in regard to his ulcers of the bilateral lower extremities all things considering. He continues to make some progress as far  as new skin growth. There still some hyper granulation noted at this point despite the use of the [facility] Dressing. This is not terrible but I think we may want to consider conclude cauterization today with silver nitrate to try to help knock some of his back as well as helping with any biofilm on the surface of the wound. 05/12/18 on evaluation today patient's wounds actually appear to be doing fairly well in regard to the bilateral lower extremities. He's been tolerating the dressing changes without complication. Fortunately there's no signs of active infection at this time which is good news. Overall very pleased with how things seem to be progressing. You select silver nitrate was beneficial for him. 05/26/18 on evaluation today patient appears to be doing better in regard to left lower extremity and a little bit worse in regard to the right lower extremity. He states that he was pulling off the [facility] Dressing peel back some of the skin making this area significantly larger than what it was previous. He's not had any issues other than this and states even that hasn't caused any pain he just seems to obviously have a much larger area on the right when compared to the previous time I saw him. No fevers, chills, nausea, or vomiting noted at this time. 06/16/18 on  evaluation today patient actually appears to be doing a little better in my pinion in regard to his lower summary ulcers. He has new skin islands that seem to be spreading which is good news. Fortunately there's no signs of active infection at this time. His biggest issue is he tells me that coming as often as he does is becoming very cost prohibitive. He wonders if we can potentially spread this out. 07/14/18 on evaluation today patient appears to be doing a little bit worse in regard to his lower from the ulcer. Unfortunately he still continues to have a significant amount of drainage I think we need to do something to try to help this more. He is still somewhat reluctant to go the route of the Wound VAC although that may be the most appropriate thing for him. No fevers, chills, nausea, or vomiting noted at this time. 08/18/2018 on evaluation today patient actually appears to be doing quite well with regard to his bilateral lower extremity ulcers. I do feel like that currently he is making great progress the care max does seem to be doing a great job at helping to control the moisture he has no maceration or skin breakdown. Again this seems to be an excellent way to go. 1 thing we may want to change is adding collagen to the base of the wound and then the care max over top he is not opposed to this. 09/15/2018 on evaluation today patient appears to be doing well with regard to his bilateral lower extremity ulcers. He is showing some signs of improvement not necessarily in size but definitely in appearance. In fact he has a lot of new skin growing throughout the wounds along the edges as well as in the central portion of the wounds on both lower extremities. Overall I am extremely pleased to see this. 10/20/2018 on evaluation today patient actually appears to be doing quite well with regard to his wounds. They are not measuring significantly smaller but he does have a lot of new epithelization noted as  compared to previous. Fortunately there is no signs of active infection at this time. No fevers, chills, nausea, vomiting, or diarrhea. 11/17/2018  on evaluation today patient presents for reevaluation concerning his bilateral lower extremity ulcers. Fortunately there is no signs of active infection at this time today. He has been tolerating the dressing changes without complication. No fevers, chills, nausea, vomiting, or diarrhea. Unfortunately in general the patient has not made as much improvement as I would like to have seen up to this point. He has been tolerating the dressing changes without complication and he does an excellent job taking care of his wounds at home in my opinion. The biggest issue I see is that he is just not making the progress that we need to be seeing currently. I think we may want to consider having him seen at a plastic surgery appointment and he has previously seen someone in years past at [facility] in [place]. That is definitely a possibility for [place] to look into at this point. 12/29/2018 on evaluation today patient appears to be doing better in regard to the overall visual appearance of his wounds which do not appear to be as macerated. He does have a much larger skin island in the middle of the left lower extremity ulcer which is doing much better. He tells me the pain is also significantly better. With that being said overall his improvement as far as the size of the wounds is not better but again these are very irregular in change shape quite often. Fortunately there is no evidence of active infection at this time which is great news. He never heard anything from [facility] regarding the plastic surgery referral that we made to them. 01/26/2019 upon evaluation today patient appears to be doing a little better in regard to his wounds today. He has been tolerating the dressing changes again he performs these for the most part on his own. He does a great  job wrapping his legs in my opinion. Unfortunately he has not been able to get down to [facility] to see if there is anything from a plastic surgery standpoint that could be done to help with his legs simply due to the fact that his wife unfortunately sustained a compression fracture in her spine she is seeing Dr. Saintclair Halsted and subsequently is going to be having what sounds to be a kyphoplasty type procedure. With that being said that has not been scheduled yet there is still waiting on an MRI the patient is very busy in fact overly busy trying to help take care of his wife at this point. I completely understand this is more of a strain on him at this time 02/23/2019 upon evaluation today patient actually appears to be making some progress. I am actually very pleased with the overall appearance of his wounds even compared to last evaluation. He seems to be doing quite well. He is taking care of his wife unfortunately she did have a compression fracture she has had the procedure for this but still she has a slow road to recovery. For that reason he still not gone to [facility] for a second opinion in this regard. Obviously the goal there was if there was anything that can be done from a skin graft standpoint or otherwise. 03/23/2019 upon evaluation today patient continues to have issues with lower extremity ulcers. Since the beginning he has made progress but at the same time the wounds unfortunately just will not close. We have been trying to get the patient to [facility] to see a specialist there but unfortunately with the everything going on with his  wife he has not been able to make that appointment time yet he states he may be able to in the next 1-2 months but is not really sure. 04/27/2019 on evaluation today patient appears to be doing a little bit more poorly. His last evaluation. He appears to have some erythema around the edges of the wound at this point. Fortunately there is no  signs of active infection at this time which is good news. No fevers, chills, nausea, vomiting, or diarrhea. 06/08/2019 on evaluation today patient appears to be doing well with regard to his wounds. Overall they are actually measuring smaller compared to the last visit last month. We did treat him for Pseudomonas as well as methicillin-resistant Staph aureus. He was only on the treatment for MRSA however for 7 days as the Cipro was resistant and subsequently we had to place him on doxycycline. Nonetheless I am thinking that we may want to add the doxycycline and just do a month-long treatment considering the longstanding nature of his wounds and see if we get this under better control. 07/13/2019 upon evaluation today patient appears to be doing fairly well in regard to his bilateral lower extremities. There does not appear to be any signs of active infection which is good news. No fevers, chills, nausea, vomiting, or diarrhea. 08/10/2019 upon evaluation today patient appears to be doing about the same with regard to his wounds in general. Unfortunately he is not significantly better although is also not significantly worse which is great news there is no evidence of active infection at this time which is good news. He still dealing with a lot going on with his wife and therefore is not really able to go see anyone at the specialty clinic at [facility] that we have previously set up still. 09/07/2019 on evaluation today patient appears to be doing well with regard to his wounds. In fact this is probably the best that have seen so far in quite a few months. Overall I am very pleased with where things stand at this time. No fevers, chills, nausea, vomiting, or diarrhea. 10/05/2019 upon evaluation today patient appears to be doing more poorly in regard to his legs at this point. He actually is showing some signs of infection. This has been something that we seem to be back-and-forth with as far as trying to keep  these wounds from becoming infected. He takes care of them very well in my opinion but nonetheless I am concerned in this regard. He tells me that his wife is still doing really about the same she is slowly getting better. Nonetheless he still spends the majority of his time helping to take care of her. 11/02/2019 upon evaluation today patient actually appears to be doing somewhat better in regard to his legs. I do believe that the compounded antibiotic treatment from [facility] has been beneficial for him. Overall I am extremely pleased with where things stand today. There is no signs of active infection at this time. The patient states he has much less drainage than he has in the past. 12/07/2019 upon evaluation today patient appears to be doing well at this time in regard to the overall appearance of his wound bed. Currently there is no signs of active infection at this time. 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 Especially when  he stressed. Fortunately there is no signs of active infection locally or systemically at this point. 01/18/2020 on evaluation today patient appears to be doing well with regard to his leg ulcers. He has been tolerating the dressing changes without complication. Fortunately there is no signs of infection and overall I feel like his legs are doing about the best they have done in quite some time. There does not appear to be any evidence of active infection which is great news and overall very pleased. 02/29/2020 upon evaluation today patient's wounds actually appear to be doing quite well currently. There is no sign of active infection at this time. No fevers, chills, nausea, vomiting, or diarrhea. 04/11/2025 on evaluation today patient appears to be doing excellent in regard to his wounds on the legs to be  honest. He has made a lot of progress there does not appear to be any signs of active infection and overall I think that he is better than previous although still the wounds are quite significant obviously. In general I think that he is pleased with where things stand at this point. Still there is quite a bit of work to do here. Electronic Signature(s) Signed: 04/11/2020 1:20:35 PM By: Worthy Keeler PA-C Entered By: Worthy Keeler on 04/11/2020 13:20:35 -------------------------------------------------------------------------------- Physical Exam Details Patient Name: Date of Service: Carlos White 04/11/2020 12:30 PM Medical Record Number: [id number] Patient Account Number: [id number] Date of Birth/Sex: Treating RN: 06/04/1940 (80 y.o. Carlos White Primary Care Provider: [name] Other Clinician: Referring Provider: Treating Provider/Extender: [name] in Treatment: 19 Constitutional Well-nourished and well-hydrated in no acute distress. Respiratory normal breathing without difficulty. Psychiatric this patient is able to make decisions and demonstrates good insight into disease process. Alert and Oriented x 3. pleasant and cooperative. Notes Patient's wound bed actually showed signs of good granulation epithelization at this point. None the wounds are deep which is great news and overall I think he is healing quite nicely. He has a couple areas that he scratched and caused some issues but other than that they heal fairly quickly on the superficial regions as the main wounds that were focused on. Electronic Signature(s) Signed: 04/11/2020 1:20:54 PM By: Worthy Keeler PA-C Entered By: Worthy Keeler on 04/11/2020 13:20:54 -------------------------------------------------------------------------------- Physician Orders Details Patient Name: Date of Service: Carlos White 04/11/2020 12:30 PM Medical Record Number: [id number] Patient  Account Number: [id number] Date of Birth/Sex: Treating RN: 1940-04-06 (80 y.o. 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 - on days that dressing is changed Edema Control - Lymphedema / SCD / Other Bilateral Lower Extremities Elevate legs to the level of the heart or above for 30 minutes daily and/or when sitting, a frequency of: - throughout the day Avoid standing for long periods of time. Exercise regularly Moisturize legs daily. Compression stocking or Garment 20-30 mm/Hg  pressure to: - Juxtalite to both legs daily Wound Treatment Wound #17 - Malleolus Wound Laterality: Right, Medial Cleanser: Soap and Water Every Other Day/30 Days Discharge Instructions: May shower and wash wound with dial antibacterial soap and water prior to dressing change. Peri-Wound Care: Zinc Oxide Ointment 30g tube Every Other Day/30 Days Discharge Instructions: Apply Zinc Oxide to periwound with each dressing change as needed for maceration Peri-Wound Care: Sween Lotion (Moisturizing lotion) Every Other Day/30 Days Discharge Instructions: Apply moisturizing lotion as directed Topical: keystone antibiotic compound spray Every Other Day/30 Days Discharge Instructions: Apply thin layer to wound bed under  collagen Prim Dressing: Fibracol Plus Dressing, 4x4.38 in (collagen) Every Other Day/30 Days ary Discharge Instructions: Moisten collagen with saline or hydrogel Secondary Dressing: ABD Pad, 5x9 Every Other Day/30 Days Discharge Instructions: Apply over primary dressing as directed. Secondary Dressing: Zetuvit Plus 4x8 in Every Other Day/30 Days Discharge Instructions: Apply over primary dressing as directed. Secondary Dressing: Xtrasorb Classic Super Absorbent Dressing, 6x9 (in/in) (Dispense As Written) Every Other Day/30 Days Discharge Instructions: Apply over primary dressing as directed. Secured With: Elastic Bandage 4 inch (ACE bandage) Every Other Day/30 Days Discharge Instructions: Secure with ACE bandage as directed. Secured With: [employer], 4.5x3.1 (in/yd) (Generic) Every Other Day/30 Days Discharge Instructions: Secure with Kerlix as directed. Wound #18 - Lower Leg Wound Laterality: Right, Lateral, Proximal Cleanser: Soap and Water Every Other Day/30 Days Discharge Instructions: May shower and wash wound with dial antibacterial soap and water prior to dressing change. Peri-Wound Care: Zinc Oxide Ointment 30g tube Every Other Day/30 Days Discharge Instructions: Apply Zinc Oxide to periwound with each dressing change as needed for maceration Peri-Wound Care: Sween Lotion (Moisturizing lotion) Every Other Day/30 Days Discharge Instructions: Apply moisturizing lotion as directed Topical: keystone antibiotic compound spray Every Other Day/30 Days Discharge Instructions: Apply thin layer to wound bed under collagen Prim Dressing: Fibracol Plus Dressing, 4x4.38 in (collagen) Every Other Day/30 Days ary Discharge Instructions: Moisten collagen with saline or hydrogel Secondary Dressing: ABD Pad, 5x9 Every Other Day/30 Days Discharge Instructions: Apply over primary dressing as directed. Secondary Dressing: Zetuvit Plus 4x8 in Every Other Day/30 Days Discharge Instructions:  Apply over primary dressing as directed. Secondary Dressing: Xtrasorb Classic Super Absorbent Dressing, 6x9 (in/in) (Dispense As Written) Every Other Day/30 Days Discharge Instructions: Apply over primary dressing as directed. Secured With: Elastic Bandage 4 inch (ACE bandage) Every Other Day/30 Days Discharge Instructions: Secure with ACE bandage as directed. Secured With: [employer], 4.5x3.1 (in/yd) (Generic) Every Other Day/30 Days Discharge Instructions: Secure with Kerlix as directed. Wound #2 - Lower Leg Wound Laterality: Left, Lateral, Distal Cleanser: Soap and Water Every Other Day/30 Days Discharge Instructions: May shower and wash wound with dial antibacterial soap and water prior to dressing change. Peri-Wound Care: Zinc Oxide Ointment 30g tube Every Other Day/30 Days Discharge Instructions: Apply Zinc Oxide to periwound with each dressing change as needed for maceration Peri-Wound Care: Sween Lotion (Moisturizing lotion) Every Other Day/30 Days Discharge Instructions: Apply moisturizing lotion as directed Topical: keystone antibiotic compound spray Every Other Day/30 Days Discharge Instructions: Apply thin layer to wound bed under collagen Prim Dressing: Fibracol Plus Dressing, 4x4.38 in (collagen) Every Other Day/30 Days ary Discharge Instructions: Moisten collagen with saline or hydrogel Secondary Dressing: ABD Pad, 5x9 Every Other Day/30 Days Discharge Instructions: Apply over primary dressing as directed. Secondary Dressing: Zetuvit Plus 4x8 in Every Other Day/30 Days Discharge Instructions: Apply over primary dressing as directed. Secondary Dressing: Lauraine Rinne Classic Super  Absorbent Dressing, 6x9 (in/in) (Dispense As Written) Every Other Day/30 Days Discharge Instructions: Apply over primary dressing as directed. Secured With: Elastic Bandage 4 inch (ACE bandage) Every Other Day/30 Days Discharge Instructions: Secure with ACE bandage as directed. Secured With:  [employer], 4.5x3.1 (in/yd) (Generic) Every Other Day/30 Days Discharge Instructions: Secure with Kerlix as directed. Wound #5 - Lower Leg Wound Laterality: Right, Lateral Cleanser: Soap and Water Every Other Day/30 Days Discharge Instructions: May shower and wash wound with dial antibacterial soap and water prior to dressing change. Peri-Wound Care: Zinc Oxide Ointment 30g tube Every Other Day/30 Days Discharge Instructions: Apply Zinc Oxide to periwound with each dressing change as needed for maceration Peri-Wound Care: Sween Lotion (Moisturizing lotion) Every Other Day/30 Days Discharge Instructions: Apply moisturizing lotion as directed Topical: keystone antibiotic compound spray Every Other Day/30 Days Discharge Instructions: Apply thin layer to wound bed under collagen Prim Dressing: Fibracol Plus Dressing, 4x4.38 in (collagen) Every Other Day/30 Days ary Discharge Instructions: Moisten collagen with saline or hydrogel Secondary Dressing: ABD Pad, 5x9 Every Other Day/30 Days Discharge Instructions: Apply over primary dressing as directed. Secondary Dressing: Zetuvit Plus 4x8 in Every Other Day/30 Days Discharge Instructions: Apply over primary dressing as directed. Secondary Dressing: Xtrasorb Classic Super Absorbent Dressing, 6x9 (in/in) (Dispense As Written) Every Other Day/30 Days Discharge Instructions: Apply over primary dressing as directed. Secured With: Elastic Bandage 4 inch (ACE bandage) Every Other Day/30 Days Discharge Instructions: Secure with ACE bandage as directed. Secured With: [employer], 4.5x3.1 (in/yd) (Generic) Every Other Day/30 Days Discharge Instructions: Secure with Kerlix as directed. Electronic Signature(s) Signed: 04/11/2020 5:20:30 PM By: Worthy Keeler PA-C Signed: 04/11/2020 5:50:20 PM By: Baruch Gouty RN, BSN Entered By: Baruch Gouty on 04/11/2020  13:22:29 -------------------------------------------------------------------------------- Problem List Details Patient Name: Date of Service: Carlos White 04/11/2020 12:30 PM Medical Record Number: [id number] Patient Account Number: [id number] Date of Birth/Sex: Treating RN: [birth year] (80 y.o. 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 09/30/2017 No Yes L97.322 Non-pressure chronic ulcer of left ankle with fat layer exposed 09/30/2017 No Yes L97.822 Non-pressure chronic ulcer of other part of left lower leg with fat layer exposed9/18/2019 No Yes L97.812 Non-pressure chronic ulcer of other part of right lower leg with fat layer 09/30/2017 No Yes exposed I10 Essential (primary) hypertension 09/30/2017 No Yes Inactive Problems Resolved Problems Electronic Signature(s) Signed: 04/11/2020 1:09:40 PM By: Worthy Keeler PA-C Entered By: Worthy Keeler on 04/11/2020 13:09:39 -------------------------------------------------------------------------------- Progress Note Details Patient Name: Date of Service: Carlos White. 04/11/2020 12:30 PM Medical Record Number: [id number] Patient Account Number: [id number] Date of Birth/Sex: Treating RN: 12-02-1940 (80 y.o. Carlos White Primary Care Provider: [name] Other Clinician: Referring Provider: Treating Provider/Extender: [name] in Treatment: [phone] Subjective Chief Complaint Information obtained from Patient Bilateral LE Ulcers History of Present Illness (HPI) 09/30/17 on evaluation today patient presents for initial evaluation and our clinic concerning issues that he has been having with  his bilateral lower extremities. He states this has been going on for quite some time at least six months. Currently his regiment has been mainly cleaning the area with peroxide, applying the is foreign ointment, and wrapping the area with ABD pads and then  an ace wrap loosely. He has dealt with issues of this nature he tells me for quite some time. He does have a history of having had a compound fracture of the left lower extremity which he thinks also makes this a much more difficult area for him to heal. He's previously been told that he had poor vascular flow but this was years ago at [facility] we do not have any of those records at this time. He has a history of Epidermolysis Bullosa which was diagnosed around age 23 and he has been cared for at [facility] since that time. Subsequently he states this is hereditary and two of his children one male and one male also have this as well is one of his grandchildren that he is aware of. He has no evidence of infection necessarily at this point although he does have some necrotic tissue noted on the surface of the wound as far as the largest, left lateral lower extremity ulcer, is concerned. Overall I feel like all things considered he's been taking care of this very well. Obviously he has some fairly significant issues going on at this point in this regard. He does have a history otherwise of hypertension though for the most part other than the compound fracture of the left leg he seems to have been fairly healthy in my pinion. 10/07/17 on evaluation today patient actually appears to be doing better in regard to his bilateral lower extremity ulcers. With that being said he does still have some evidence of slough noted on the surface of the wounds I think the Iodoflex has been beneficial for him. His arterial studies are scheduled for October 2. With that being said I do believe that he is continuing to show signs of good improvement which is at least good  news. 10/14/17 on evaluation today patient appears to be doing very well in regard to his lower extremity ulcers. He's definitely made some progress as far as healing is concerned although there still are several open areas that are going to need to be addressed. He did have his arterial study today which fortunately shows good findings with a right ABI of 1.23 with a TBI of 0.86 in the left ABI of 1.28 with a TBI of 0.81. This is good news and will allow [place] to perform debridement as well. 10/23/2017; patient with a large wound on the left lateral calf, sizable area on the left medial malleolus and an area on the right lateral malleolus. He has a new blister consistent with his underlying blistering skin disease just above this area we have been using Iodoflex on the lateral left calf lateral right ankle and collagen on the medial left ankle. We have been using Kerlix Coban wraps 10/28/17 on evaluation today the patient continues to have signs of improvement in regard to the overall appearance of the original wound. Unfortunately he did have some blistering over the right lateral lower extremity which has appeared to rupture on evaluation today and likely some of the dead tissue on the surface needs to be cleaned away the good news is this does not appear to be to significantly deep at this time. 11/04/17 on evaluation today patient actually appears to be doing a little better in regard to his lower extremity ulcers. He has been tolerating the dressing changes without complication. With that being said he does still have a significant wound especially over the left lateral lower extremity unfortunately. All of the wounds pretty much are going to  require sharp debridement today. 11/11/17 on evaluation today patient appears to be doing more poorly in regard to his left lower extremity in particular. There does not appear to be any evidence of systemic infection although the wound itself as far as the  larger left lateral lower extremity ulcer actually appears to be infected in my pinion. There's an older and the surface of the wound is dramatically worsened compared to last week. No fevers, chills, nausea, or vomiting noted at this time. 11/18/17 upon evaluation today patient actually appears to be doing better. I did review his culture today which really did not show any specific organism is a positive reason for his wound decline. There are multiple organisms present not predominant. Nonetheless he seems to be tolerate the doxycycline well in his wounds in general do seem to be doing better. Fortunately there does not appear to be any evidence of infection at this time which is good news. Overall I'm very pleased with how things appear. Nonetheless he still has a lot of healing to go. I do think he could benefit from a Juxta-Lite wrap. 11/25/17 on evaluation today patient actually appears to be doing fairly well in regard to his wounds. He is still taking the antibiotics he has a few days left. Fortunately this seems to have been excellent for him as far as getting the infection control and very happy in this regard. With that being said the patient likewise is also very pleased with how things appear at this time in comparison to where we were he's not having as much pain. 12/02/17 Seen today for follow p and management of LLE wounds. Wounds appear to show some improvement. He denies pain, fever, or chills. Completed a course of doxycycline earlier this month. Scheduled to received Juxta-Lite wrap this week. No s/s of infections. 12/09/17 on evaluation today patient actually appears to be doing a little bit better in regard to his wounds. This is obscene very slow process and unfortunately he has a couple of new areas and this is due to the Epidermolysis Bullosa. Nonetheless I am concerned about the fact that he seems to be getting more areas not less that is the reason we're gonna work on getting  schedule for the vascular referral to see the venous specialist. 12/23/17 upon evaluation today patient's wounds currently shows evidence of still not doing quite as well is what I would like to have seen. Subsequently the patient did have his venous study which showed evidence of venous stasis. Subsequently I do think that a vascular evaluation for consideration of venous intervention would be appropriate. I'm not necessarily suggesting that will be anything that can be done but I think it is at least a good idea. He is in agreement with this plan. 12/30/17 on evaluation today patient actually appears to be doing very well in regard to his wounds when compared to previous evaluation. Subsequently we have been using the [facility] Dressing which actually appears to have done excellent on his left lateral lower extremity ulcer. The quality of the wound surface is dramatically improved. There is some slight debridement that is going to be required at a couple of locations but overall I'm extremely pleased with how things appear here. 01/07/2018; this is a patient with a primary skin disorder epidermolyis bullosa. Is a large wound on the left lateral calf and smaller wounds on the right however there is a new wound on the right mid tibia area that occurred within the compression wrap  that he did not change. We have been using Hydrofera Blue. On the left he is using Hydrofera Blue and Santyl to the inferior part of the wound and changing the dressing himself. 01/15/2018; primary skin disorder epidermolysis bullosa. He has several difficult wounds including the left lateral calf, smaller wounds on the left medial calf and the right lateral calf. The major area on the left lateral calf has a smaller area inferiorly that has necrotic debris we have been using Santyl to this. The rest of the wounds we have been using Hydrofera Blue. The area on the left calf actually looks larger this week. Uncontrolled  edema several small open areas above it that are superficial 01/20/18 on evaluation today patient appears to be doing better as compared to last week in regard to his wounds of the bilateral lower extremities. He tolerated the bilateral compression wrap without complication. Overall I'm very pleased with how things appear at this time. The patient likewise is very happy. 01/27/18 on evaluation today patient appears to be doing decently well in regard to his bilateral lower extremity ulcers. He's been tolerating the dressing changes without complication. One issue he had is that he did have more drainage to the left leg wrapped last week. He states in fact he probably should come in and let us change it on Friday however he just left it in place and kept adding extra absorption with ABD pads to the external portion of the wrap. Unfortunately he does have some aspiration type breakdown nothing significant but I do believe that this was probably counterproductive in general. Nonetheless his wounds do not appear to be terrible overall. 02/03/18 on evaluation today patient appears to be doing rather well in regard to his lower extremity ulcers. He has been tolerating the dressing changes without complication. He does tell me that he had to change the wrap on the left one since we last saw him. Subsequently I do not see any evidence of infection I do feel like the food was much better controlled at this point. 02/10/18 on evaluation today patient appears to be doing rather well in regard to his ulcers. He still has significant alterations especially on the left lateral lower extremity. Fortunately there's no signs of infection at this time. Overall I feel like he is making good progress are some areas that I'm gonna attempt some debridement today. 02/17/18 on evaluation today patient appears to be doing okay in regard to his lower [place] ulcer. It does appear on both locations he has a little bit of  drainage causing some breakdown in maceration around the wound bed's although it doesn't appear to be too bad the right is a little bit worse than left. Fortunately there's no signs of infection which is good news. No fevers, chills, nausea, or vomiting noted at this time. 03/10/18 on evaluation today patient actually appears to be doing rather poorly in regard to his bilateral lower extremity ulcers. The right in particular is draining profusely and the wound is actually enlarging which is not good. I'm concerned about both possibly infection and the fact that there's a lot of moisture which is causing breakdown as well. Unfortunately the patient has been trying to change this at home I'm afraid he may need to change more frequently in order to see the improvement that were looking for. There's no signs of systemic infection. 03/17/18 patient actually appears to be doing significantly better at this point in regard to his bilateral lower extremity ulcers. Fortunately  there's no signs of infection. That is worsening infection at least indefinitely nothing systemic. With that being said he is having a lot of drainage though not quite as much is there in his last evaluation. Overall feel like he's on the side of improvement. I think if his results back from his culture which showed that he had a positive group B strep along with abundant Pseudomonas noted on the culture. For that reason I am gonna have him continue with the linezolid as we previously have ordered for him and I did go ahead as well today and prescribe Levaquin as well in order to treat the Pseudomonas portion of the infection noted. 03/24/18 on evaluation today patient actually appears to be doing very well in regard to his lower Trinity ulcer. He's been tolerating the dressing changes without complication. Fortunately both legs show signs of less drainage in his edema is very well controlled at this point as well. Overall very pleased with  how things seem to be progressing. 03/31/18 on evaluation today patient actually appears to be doing excellent in regard to his bilateral lower extremity ulcers. These are not draining nearly as significant as what they were in the past overall seem to be shown signs of excellent improvement which is great news. Fortunately there is no sign of active infection at this time I do believe that the Levaquin has done extremely well for him in this regard. The patient continues to change these at home typically every day. We may be able to slowly work towards every other day changes since the drainage seems to be slowing down quite significantly. 04/14/18 on evaluation today patient appears to be doing well in regard to his bilateral lower extremities. Let me [facility] Almost completely healed which is excellent news. Fortunately he's shown signs of improvement all other sites as well with new skin growth there's some slight hyper granular tissue but for the most part this seems to be well maintained with the [facility] Dressing. I'm very happy in this regard. 04/28/18 on evaluation today patient appears to be doing rather well in regard to his ulcers of the bilateral lower extremities all things considering. He continues to make some progress as far as new skin growth. There still some hyper granulation noted at this point despite the use of the [facility] Dressing. This is not terrible but I think we may want to consider conclude cauterization today with silver nitrate to try to help knock some of his back as well as helping with any biofilm on the surface of the wound. 05/12/18 on evaluation today patient's wounds actually appear to be doing fairly well in regard to the bilateral lower extremities. He's been tolerating the dressing changes without complication. Fortunately there's no signs of active infection at this time which is good news. Overall very pleased with how things seem to  be progressing. You select silver nitrate was beneficial for him. 05/26/18 on evaluation today patient appears to be doing better in regard to left lower extremity and a little bit worse in regard to the right lower extremity. He states that he was pulling off the [facility] Dressing peel back some of the skin making this area significantly larger than what it was previous. He's not had any issues other than this and states even that hasn't caused any pain he just seems to obviously have a much larger area on the right when compared to the previous time I saw him. No fevers, chills, nausea, or  vomiting noted at this time. 06/16/18 on evaluation today patient actually appears to be doing a little better in my pinion in regard to his lower summary ulcers. He has new skin islands that seem to be spreading which is good news. Fortunately there's no signs of active infection at this time. His biggest issue is he tells me that coming as often as he does is becoming very cost prohibitive. He wonders if we can potentially spread this out. 07/14/18 on evaluation today patient appears to be doing a little bit worse in regard to his lower from the ulcer. Unfortunately he still continues to have a significant amount of drainage I think we need to do something to try to help this more. He is still somewhat reluctant to go the route of the Wound VAC although that may be the most appropriate thing for him. No fevers, chills, nausea, or vomiting noted at this time. 08/18/2018 on evaluation today patient actually appears to be doing quite well with regard to his bilateral lower extremity ulcers. I do feel like that currently he is making great progress the care max does seem to be doing a great job at helping to control the moisture he has no maceration or skin breakdown. Again this seems to be an excellent way to go. 1 thing we may want to change is adding collagen to the base of the wound and then the care max over top  he is not opposed to this. 09/15/2018 on evaluation today patient appears to be doing well with regard to his bilateral lower extremity ulcers. He is showing some signs of improvement not necessarily in size but definitely in appearance. In fact he has a lot of new skin growing throughout the wounds along the edges as well as in the central portion of the wounds on both lower extremities. Overall I am extremely pleased to see this. 10/20/2018 on evaluation today patient actually appears to be doing quite well with regard to his wounds. They are not measuring significantly smaller but he does have a lot of new epithelization noted as compared to previous. Fortunately there is no signs of active infection at this time. No fevers, chills, nausea, vomiting, or diarrhea. 11/17/2018 on evaluation today patient presents for reevaluation concerning his bilateral lower extremity ulcers. Fortunately there is no signs of active infection at this time today. He has been tolerating the dressing changes without complication. No fevers, chills, nausea, vomiting, or diarrhea. Unfortunately in general the patient has not made as much improvement as I would like to have seen up to this point. He has been tolerating the dressing changes without complication and he does an excellent job taking care of his wounds at home in my opinion. The biggest issue I see is that he is just not making the progress that we need to be seeing currently. I think we may want to consider having him seen at a plastic surgery appointment and he has previously seen someone in years past at [facility] in [place]. That is definitely a possibility for [place] to look into at this point. 12/29/2018 on evaluation today patient appears to be doing better in regard to the overall visual appearance of his wounds which do not appear to be as macerated. He does have a much larger skin island in the middle of the left lower extremity ulcer  which is doing much better. He tells me the pain is also significantly better. With that being said overall his improvement  as far as the size of the wounds is not better but again these are very irregular in change shape quite often. Fortunately there is no evidence of active infection at this time which is great news. He never heard anything from [facility] regarding the plastic surgery referral that we made to them. 01/26/2019 upon evaluation today patient appears to be doing a little better in regard to his wounds today. He has been tolerating the dressing changes again he performs these for the most part on his own. He does a great job wrapping his legs in my opinion. Unfortunately he has not been able to get down to [facility] to see if there is anything from a plastic surgery standpoint that could be done to help with his legs simply due to the fact that his wife unfortunately sustained a compression fracture in her spine she is seeing Dr. Saintclair Halsted and subsequently is going to be having what sounds to be a kyphoplasty type procedure. With that being said that has not been scheduled yet there is still waiting on an MRI the patient is very busy in fact overly busy trying to help take care of his wife at this point. I completely understand this is more of a strain on him at this time 02/23/2019 upon evaluation today patient actually appears to be making some progress. I am actually very pleased with the overall appearance of his wounds even compared to last evaluation. He seems to be doing quite well. He is taking care of his wife unfortunately she did have a compression fracture she has had the procedure for this but still she has a slow road to recovery. For that reason he still not gone to [facility] for a second opinion in this regard. Obviously the goal there was if there was anything that can be done from a skin graft standpoint or otherwise. 03/23/2019 upon evaluation today patient continues to have issues  with lower extremity ulcers. Since the beginning he has made progress but at the same time the wounds unfortunately just will not close. We have been trying to get the patient to [facility] to see a specialist there but unfortunately with the everything going on with his wife he has not been able to make that appointment time yet he states he may be able to in the next 1-2 months but is not really sure. 04/27/2019 on evaluation today patient appears to be doing a little bit more poorly. His last evaluation. He appears to have some erythema around the edges of the wound at this point. Fortunately there is no signs of active infection at this time which is good news. No fevers, chills, nausea, vomiting, or diarrhea. 06/08/2019 on evaluation today patient appears to be doing well with regard to his wounds. Overall they are actually measuring smaller compared to the last visit last month. We did treat him for Pseudomonas as well as methicillin-resistant Staph aureus. He was only on the treatment for MRSA however for 7 days as the Cipro was resistant and subsequently we had to place him on doxycycline. Nonetheless I am thinking that we may want to add the doxycycline and just do a month-long treatment considering the longstanding nature of his wounds and see if we get this under better control. 07/13/2019 upon evaluation today patient appears to be doing fairly well in regard to his bilateral lower extremities. There does not appear to be any signs of active infection which is good news. No fevers, chills,  nausea, vomiting, or diarrhea. 08/10/2019 upon evaluation today patient appears to be doing about the same with regard to his wounds in general. Unfortunately he is not significantly better although is also not significantly worse which is great news there is no evidence of active infection at this time which is good news. He still dealing with a lot going on with his wife and  therefore is not really able to go see anyone at the specialty clinic at [facility] that we have previously set up still. 09/07/2019 on evaluation today patient appears to be doing well with regard to his wounds. In fact this is probably the best that have seen so far in quite a few months. Overall I am very pleased with where things stand at this time. No fevers, chills, nausea, vomiting, or diarrhea. 10/05/2019 upon evaluation today patient appears to be doing more poorly in regard to his legs at this point. He actually is showing some signs of infection. This has been something that we seem to be back-and-forth with as far as trying to keep these wounds from becoming infected. He takes care of them very well in my opinion but nonetheless I am concerned in this regard. He tells me that his wife is still doing really about the same she is slowly getting better. Nonetheless he still spends the majority of his time helping to take care of her. 11/02/2019 upon evaluation today patient actually appears to be doing somewhat better in regard to his legs. I do believe that the compounded antibiotic treatment from [facility] has been beneficial for him. Overall I am extremely pleased with where things stand today. There is no signs of active infection at this time. The patient states he has much less drainage than he has in the past. 12/07/2019 upon evaluation today patient appears to be doing well at this time in regard to the overall appearance of his wound bed. Currently there is no signs of active infection at this time. 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 Especially when he stressed. Fortunately there is no signs of active infection locally or systemically at this point. 01/18/2020 on evaluation today patient appears to be doing well with  regard to his leg ulcers. He has been tolerating the dressing changes without complication. Fortunately there is no signs of infection and overall I feel like his legs are doing about the best they have done in quite some time. There does not appear to be any evidence of active infection which is great news and overall very pleased. 02/29/2020 upon evaluation today patient's wounds actually appear to be doing quite well currently. There is no sign of active infection at this time. No fevers, chills, nausea, vomiting, or diarrhea. 04/11/2025 on evaluation today patient appears to be doing excellent in regard to his wounds on the legs to be honest. He has made a lot of progress there does not appear to be any signs of active infection and overall I think that he is better than previous although still the wounds are quite significant obviously. In general I think that he is pleased with where things stand at this point. Still there is quite a bit of work to do here. Objective Constitutional Well-nourished and well-hydrated in no acute distress. Vitals Time Taken: 12:53 PM, Height: 71 in, Weight: 220  lbs, BMI: 30.7, Temperature: 97.7 F, Pulse: 61 bpm, Respiratory Rate: 18 breaths/min, Blood Pressure: 165/73 mmHg. Respiratory normal breathing without difficulty. Psychiatric this patient is able to make decisions and demonstrates good insight into disease process. Alert and Oriented x 3. pleasant and cooperative. General Notes: Patient's wound bed actually showed signs of good granulation epithelization at this point. None the wounds are deep which is great news and overall I think he is healing quite nicely. He has a couple areas that he scratched and caused some issues but other than that they heal fairly quickly on the superficial regions as the main wounds that were focused on. Integumentary (Hair, Skin) Wound #17 status is Open. Original cause of wound was Gradually Appeared. The date acquired  was: 09/28/2019. The wound has been in treatment 27 weeks. The wound is located on the Right,Medial Malleolus. The wound measures 0.5cm length x 0.5cm width x 0.1cm depth; 0.196cm^2 area and 0.02cm^3 volume. There is Fat Layer (Subcutaneous Tissue) exposed. There is no tunneling or undermining noted. There is a medium amount of serosanguineous drainage noted. The wound margin is distinct with the outline attached to the wound base. There is large (67-100%) pink, hyper - granulation within the wound bed. There is no necrotic tissue within the wound bed. Wound #18 status is Open. Original cause of wound was Gradually Appeared. The date acquired was: 04/11/2020. The wound is located on the Right,Proximal,Lateral Lower Leg. The wound measures 3cm length x 3cm width x 0.1cm depth; 7.069cm^2 area and 0.707cm^3 volume. There is Fat Layer (Subcutaneous Tissue) exposed. There is no tunneling or undermining noted. There is a medium amount of serosanguineous drainage noted. The wound margin is flat and intact. There is large (67-100%) red granulation within the wound bed. There is no necrotic tissue within the wound bed. Wound #2 status is Open. Original cause of wound was Gradually Appeared. The date acquired was: 03/13/2017. The wound has been in treatment 132 weeks. The wound is located on the Left,Distal,Lateral Lower Leg. The wound measures 12cm length x 15.5cm width x 0.1cm depth; 146.084cm^2 area and 14.608cm^3 volume. There is Fat Layer (Subcutaneous Tissue) exposed. There is no tunneling or undermining noted. There is a large amount of serosanguineous drainage noted. The wound margin is flat and intact. There is large (67-100%) red granulation within the wound bed. There is a small (1-33%) amount of necrotic tissue within the wound bed including Adherent Slough. Wound #5 status is Open. Original cause of wound was Gradually Appeared. The date acquired was: 10/26/2017. The wound has been in treatment 128  weeks. The wound is located on the Right,Lateral Lower Leg. The wound measures 6.8cm length x 13cm width x 0.1cm depth; 69.429cm^2 area and 6.943cm^3 volume. There is Fat Layer (Subcutaneous Tissue) exposed. There is no tunneling or undermining noted. There is a large amount of serosanguineous drainage noted. The wound margin is flat and intact. There is large (67-100%) red granulation within the wound bed. There is a small (1-33%) amount of necrotic tissue within the wound bed including Adherent Slough. 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 Would recommend currently that we going to continue with the wound care measures as before and the patient is in agreement with  the plan this includes the use of the [facility] topical antibiotics. Subsequently he is also utilizing the zinc around the edges of the wounds to protect the good skin followed by silver collagen, an ABD pad and then to tube it or XtraSorb. 2. I am also going to recommend that we have the patient continue to use the compression. He is using an Ace wrap after Kerlix to secure everything in place and then is juxta light over top of this. 3. I am also can recommend he should continue to elevate his legs much as possible try to keep edema under good control. We will see patient back for reevaluation in 6 weeks here in the clinic. If anything worsens or changes patient will contact our office for additional recommendations. Electronic Signature(s) Signed: 04/11/2020 1:22:04 PM By: Worthy Keeler PA-C Entered By: Worthy Keeler on 04/11/2020  13:22:04 -------------------------------------------------------------------------------- SuperBill Details Patient Name: Date of Service: Carlos White 04/11/2020 Medical Record Number: [id number] Patient Account Number: [id number] Date of Birth/Sex: Treating RN: [id number] (80 y.o. Carlos White Primary Care Provider: [name] Other Clinician: Referring Provider: Treating Provider/Extender: [name] in Treatment: 132 Diagnosis Coding ICD-10 Codes Code Description I87.2 Venous insufficiency (chronic) (peripheral) Q81.8 Other epidermolysis bullosa L97.322 Non-pressure chronic ulcer of left ankle with fat layer exposed L97.822 Non-pressure chronic ulcer of other part of left lower leg with fat layer exposed L97.812 Non-pressure chronic ulcer of other part of right lower leg with fat layer exposed [facility] (primary) hypertension Facility Procedures CPT4 Code: [id number] Description: 63893 - WOUND CARE VISIT-LEV 5 EST PT Modifier: Quantity: 1 Physician Procedures : CPT4 Code Description Modifier [id number] - WC PHYS LEVEL 3 - EST PT ICD-10 Diagnosis Description I87.2 Venous insufficiency (chronic) (peripheral) Q81.8 Other epidermolysis bullosa L97.322 Non-pressure chronic ulcer of left ankle with fat layer  exposed L97.822 Non-pressure chronic ulcer of other part of left lower leg with fat layer exposed Quantity: 1 Electronic Signature(s) Signed: 04/11/2020 1:22:15 PM By: Worthy Keeler PA-C Entered By: Worthy Keeler on 04/11/2020 13:22:14

## 2020-04-12 NOTE — Progress Notes (Signed)
BASTION, BOLGER ([id number]) Visit Report for 04/11/2020 Arrival Information Details Patient Name: Date of Service: Carlos White 04/11/2020 12:30 PM Medical Record Number: [id number] Patient Account Number: [id number] Date of Birth/Sex: Treating RN: 02-26-1940 (80 y.o. Janyth Contes Primary Care Fumio Vandam: [name] Other Clinician: Referring Analiyah Lechuga: Treating Karlisha Mathena/Extender: [name] in Treatment: 43 Visit Information History Since Last Visit Added or deleted any medications: No Patient Arrived: Ambulatory Any new allergies or adverse reactions: No Arrival Time: 12:51 Had a fall or experienced change in No Accompanied By: alone activities of daily living that may affect Transfer Assistance: None risk of falls: Patient Requires Transmission-Based Precautions: No Signs or symptoms of abuse/neglect since last visito No Patient Has Alerts: Yes Hospitalized since last visit: No Patient Alerts: R ABI= 1.23, TBI = .86 Implantable device outside of the clinic excluding No L ABI= 1.28, TBI=.81 cellular tissue based products placed in the center since last visit: Has Dressing in Place as Prescribed: Yes Has Compression in Place as Prescribed: Yes Pain Present Now: No Electronic Signature(s) Signed: 04/12/2020 5:46:28 PM By: Levan Hurst RN, BSN Entered By: Levan Hurst on 04/11/2020 12:52:13 -------------------------------------------------------------------------------- Clinic Level of Care Assessment Details Patient Name: Date of Service: Carlos White 04/11/2020 12:30 PM Medical Record Number: [id number] Patient Account Number: [id number] Date of Birth/Sex: Treating RN: 06-Dec-1940 (80 y.o. [name] Primary Care Ulani Degrasse: [name] Other Clinician: Referring Jene Oravec: Treating Janmarie Smoot/Extender: [name] in Treatment: 132 Clinic Level of Care Assessment Items TOOL 4 Quantity Score _0  -  0 Use when only an EandM is performed on FOLLOW-UP visit ASSESSMENTS - Nursing Assessment / Reassessment X- 1 10 Reassessment of Co-morbidities (includes updates in patient status) X- 1 5 Reassessment of Adherence to Treatment Plan ASSESSMENTS - Wound and Skin A ssessment / Reassessment _1  - 0 Simple Wound Assessment / Reassessment - one wound X- 4 5 Complex Wound Assessment / Reassessment - multiple wounds _2  - 0 Dermatologic / Skin Assessment (not related to wound area) ASSESSMENTS - Focused Assessment X- 2 5 Circumferential Edema Measurements - multi extremities _3  - 0 Nutritional Assessment / Counseling / Intervention X- 1 5 Lower Extremity Assessment (monofilament, tuning fork, pulses) _4  - 0 Peripheral Arterial Disease Assessment (using hand held doppler) ASSESSMENTS - Ostomy and/or Continence Assessment and Care _5  - 0 Incontinence Assessment and Management _6  - 0 Ostomy Care Assessment and Management (repouching, etc.) PROCESS - Coordination of Care X - Simple Patient / Family Education for ongoing care 1 15 _7  - 0 Complex (extensive) Patient / Family Education for ongoing care X- 1 10 Staff obtains Programmer, systems, Records, T Results / Process Orders est _8  - 0 Staff telephones HHA, Nursing Homes / Clarify orders / etc _9  - 0 Routine Transfer to another Facility (non-emergent condition) _10  - 0 Routine Hospital Admission (non-emergent condition) _11  - 0 New Admissions / Biomedical engineer / Ordering NPWT Apligraf, etc. , _12  - 0 Emergency Hospital Admission (emergent condition) X- 1 10 Simple Discharge Coordination _13  - 0 Complex (extensive) Discharge Coordination PROCESS - Special Needs _14  - 0 Pediatric / Minor Patient Management _15  - 0 Isolation Patient Management _16  - 0 Hearing / Language / Visual special needs _17  - 0 Assessment of Community assistance (transportation, D/C planning, etc.) _18  - 0 Additional assistance / Altered mentation _19  -  0 Support Surface(s) Assessment (bed, cushion, seat, etc.) INTERVENTIONS - Wound Cleansing / Measurement _20  - 0 Simple  Wound Cleansing - one wound X- 4 5 Complex Wound Cleansing - multiple wounds X- 1 5 Wound Imaging (photographs - any number of wounds) _0  - 0 Wound Tracing (instead of photographs) _1  - 0 Simple Wound Measurement - one wound X- 4 5 Complex Wound Measurement - multiple wounds INTERVENTIONS - Wound Dressings _2  - 0 Small Wound Dressing one or multiple wounds _3  - 0 Medium Wound Dressing one or multiple wounds X- 2 20 Large Wound Dressing one or multiple wounds X- 1 5 Application of Medications - topical <AGTXMIWOEHOZYYQM>_2<\/NOIBBCWUGQBVQXIH>_0  - 0 Application of Medications - injection INTERVENTIONS - Miscellaneous _5  - 0 External ear exam _6  - 0 Specimen Collection (cultures, biopsies, blood, body fluids, etc.) _7  - 0 Specimen(s) / Culture(s) sent or taken to Lab for analysis _8  - 0 Patient Transfer (multiple staff / Civil Service fast streamer / Similar devices) _9  - 0 Simple Staple / Suture removal (25 or less) _10  - 0 Complex Staple / Suture removal (26 or more) _11  - 0 Hypo / Hyperglycemic Management (close monitor of Blood Glucose) _12  - 0 Ankle / Brachial Index (ABI) - do not check if billed separately X- 1 5 Vital Signs Has the patient been seen at the hospital within the last three years: Yes Total Score: 180 Level Of Care: New/Established - Level 5 Electronic Signature(s) Signed: 04/11/2020 5:50:20 PM By: Baruch Gouty RN, BSN Entered By: Baruch Gouty on 04/11/2020 13:17:45 -------------------------------------------------------------------------------- Lower Extremity Assessment Details Patient Name: Date of Service: [name] 04/11/2020 12:30 PM Medical Record Number: [id number] Patient Account Number: [id number] Date of Birth/Sex: Treating RN: 08-23-1940 (80 y.o. Janyth Contes Primary Care Amillion Scobee: [name] Other Clinician: Referring Yesica Kemler: Treating  Perry Molla/Extender: [name] in Treatment: 132 Edema Assessment Assessed: [Left: No] Patrice Paradise: No] Edema: [Left: Yes] [Right: Yes] Calf Left: Right: Point of Measurement: 35.5 cm From Medial Instep 30.5 cm 37.5 cm Ankle Left: Right: Point of Measurement: 11 cm From Medial Instep 25.5 cm 26 cm Vascular Assessment Pulses: Dorsalis Pedis Palpable: [Left:Yes] [Right:Yes] Electronic Signature(s) Signed: 04/12/2020 5:46:28 PM By: Levan Hurst RN, BSN Entered By: Levan Hurst on 04/11/2020 13:06:12 -------------------------------------------------------------------------------- Multi-Disciplinary Care Plan Details Patient Name: Date of Service: [name], [name] 04/11/2020 12:30 PM Medical Record Number: [id number] Patient Account Number: [id number] Date of Birth/Sex: Treating RN: [birth year] (80 y.o. [name] Primary Care Rockne Dearinger: [name] Other Clinician: Referring Rae Plotner: Treating Andrianna Manalang/Extender: [name] in Treatment: [place] reviewed with physician Active Inactive Venous Leg Ulcer Nursing Diagnoses: Knowledge deficit related to disease process and management Potential for venous Insuffiency (use before diagnosis confirmed) Goals: Patient will maintain optimal edema control Date Initiated: 09/30/2017 Target Resolution Date: 05/09/2020 Goal Status: Active Patient/caregiver will verbalize understanding of disease process and disease management Date Initiated: 09/30/2017 Date Inactivated: 11/04/2017 Target Resolution Date: 10/30/2017 Goal Status: Met Interventions: Assess peripheral edema status every visit. Provide education on venous insufficiency Notes: Wound/Skin Impairment Nursing Diagnoses: Impaired tissue integrity Knowledge deficit related to ulceration/compromised skin integrity Goals: Patient/caregiver will verbalize understanding of skin care regimen Date Initiated:  09/30/2017 Target Resolution Date: 05/09/2020 Goal Status: Active Ulcer/skin breakdown will have a volume reduction of 30% by week 4 Date Initiated: 09/30/2017 Date Inactivated: 11/04/2017 Target Resolution Date: 10/30/2017 Goal Status: Met Interventions: Assess patient/caregiver ability to obtain necessary supplies Assess patient/caregiver ability to perform ulcer/skin care regimen upon admission and as needed Assess ulceration(s) every visit Provide education on ulcer and skin  care Notes: Electronic Signature(s) Signed: 04/11/2020 5:50:20 PM By: Baruch Gouty RN, BSN Entered By: Baruch Gouty on 04/11/2020 13:15:06 -------------------------------------------------------------------------------- Pain Assessment Details Patient Name: Date of Service: [name] 04/11/2020 12:30 PM Medical Record Number: [id number] Patient Account Number: [id number] Date of Birth/Sex: Treating RN: [birth year] (80 y.o. Janyth Contes Primary Care Deanta Mincey: [name] Other Clinician: Referring Lyndsy Gilberto: Treating Harue Pribble/Extender: [name] in Treatment: [phone] Active Problems Location of Pain Severity and Description of Pain Patient Has Paino No Site Locations Pain Management and Medication Current Pain Management: Electronic Signature(s) Signed: 04/12/2020 5:46:28 PM By: Levan Hurst RN, BSN Entered By: Levan Hurst on 04/11/2020 12:59:06 -------------------------------------------------------------------------------- Patient/Caregiver Education Details Patient Name: Date of Service: Carlos White 3/30/2022andnbsp12:30 PM Medical Record Number: [id number] Patient Account Number: [id number] Date of Birth/Gender: Treating RN: 1940/04/06 (80 y.o. [name] Primary Care Physician: [name] Other Clinician: Referring Physician: Treating Physician/Extender: [name] in Treatment: [phone] Education  Assessment Education Provided To: Patient Education Topics Provided Venous: Methods: Explain/Verbal Responses: Reinforcements needed, State content correctly Wound/Skin Impairment: Methods: Explain/Verbal Responses: Reinforcements needed, State content correctly Electronic Signature(s) Signed: 04/11/2020 5:50:20 PM By: Baruch Gouty RN, BSN Entered By: Baruch Gouty on 04/11/2020 13:16:12 -------------------------------------------------------------------------------- Wound Assessment Details Patient Name: Date of Service: [name] 04/11/2020 12:30 PM Medical Record Number: [id number] Patient Account Number: [id number] Date of Birth/Sex: Treating RN: [birth year] (80 y.o. Janyth Contes Primary Care Aquinnah Devin: [name] Other Clinician: Referring Heinz Eckert: Treating Brentney Goldbach/Extender: [name] in Treatment: 132 Wound Status Wound Number: 17 Primary Venous Leg Ulcer Etiology: Wound Location: Right, Medial Malleolus Wound Status: Open Wounding Event: Gradually Appeared Comorbid Cataracts, Chronic Obstructive Pulmonary Disease (COPD), Date Acquired: 09/28/2019 History: Hypertension Weeks Of Treatment: 27 Clustered Wound: No Photos Wound Measurements Length: (cm) 0.5 Width: (cm) 0.5 Depth: (cm) 0.1 Area: (cm) 0.196 Volume: (cm) 0.02 % Reduction in Area: 92.2% % Reduction in Volume: 97.3% Epithelialization: Medium (34-66%) Tunneling: No Undermining: No Wound Description Classification: Full Thickness Without Exposed Support Structures Wound Margin: Distinct, outline attached Exudate Amount: Medium Exudate Type: Serosanguineous Exudate Color: red, brown Foul Odor After Cleansing: No Slough/Fibrino No Wound Bed Granulation Amount: Large (67-100%) Exposed Structure Granulation Quality: Pink, Hyper-granulation Fascia Exposed: No Necrotic Amount: None Present (0%) Fat Layer (Subcutaneous Tissue) Exposed: Yes Tendon Exposed:  No Muscle Exposed: No Joint Exposed: No Bone Exposed: No Electronic Signature(s) Signed: 04/11/2020 5:25:01 PM By: Sandre Kitty Signed: 04/12/2020 5:46:28 PM By: Levan Hurst RN, BSN Entered By: Sandre Kitty on 04/11/2020 17:19:25 -------------------------------------------------------------------------------- Wound Assessment Details Patient Name: Date of Service: [name] 04/11/2020 12:30 PM Medical Record Number: [id number] Patient Account Number: [id number] Date of Birth/Sex: Treating RN: 1940/07/17 (80 y.o. Janyth Contes Primary Care Xayvier Vallez: [name] Other Clinician: Referring Aryah Doering: Treating Selita Staiger/Extender: [name] in Treatment: 132 Wound Status Wound Number: 18 Primary Vasculopathy Etiology: Wound Location: Right, Proximal, Lateral Lower Leg Wound Status: Open Wounding Event: Gradually Appeared Comorbid Cataracts, Chronic Obstructive Pulmonary Disease (COPD), Date Acquired: 04/11/2020 History: Hypertension Weeks Of Treatment: 0 Clustered Wound: No Photos Wound Measurements Length: (cm) 3 Width: (cm) 3 Depth: (cm) 0.1 Area: (cm) 7.069 Volume: (cm) 0.707 % Reduction in Area: 0% % Reduction in Volume: 0% Epithelialization: None Tunneling: No Undermining: No Wound Description Classification: Full Thickness Without Exposed Support Structures Wound Margin: Flat and Intact Exudate Amount: Medium Exudate Type:  Serosanguineous Exudate Color: red, brown Foul Odor After Cleansing: No Slough/Fibrino No Wound Bed Granulation Amount: Large (67-100%) Exposed Structure Granulation Quality: Red Fascia Exposed: No Necrotic Amount: None Present (0%) Fat Layer (Subcutaneous Tissue) Exposed: Yes Tendon Exposed: No Muscle Exposed: No Joint Exposed: No Bone Exposed: No Electronic Signature(s) Signed: 04/11/2020 5:25:01 PM By: Sandre Kitty Signed: 04/12/2020 5:46:28 PM By: Levan Hurst RN, BSN Entered By:  Sandre Kitty on 04/11/2020 17:18:55 -------------------------------------------------------------------------------- Wound Assessment Details Patient Name: Date of Service: [name] 04/11/2020 12:30 PM Medical Record Number: [id number] Patient Account Number: [id number] Date of Birth/Sex: Treating RN: Oct 16, 1940 (80 y.o. Janyth Contes Primary Care Betzy Barbier: [name] Other Clinician: Referring Torre Pikus: Treating Siomara Burkel/Extender: [name] in Treatment: 132 Wound Status Wound Number: 2 Primary Venous Leg Ulcer Etiology: Wound Location: Left, Distal, Lateral Lower Leg Wound Status: Open Wounding Event: Gradually Appeared Comorbid Cataracts, Chronic Obstructive Pulmonary Disease (COPD), Date Acquired: 03/13/2017 History: Hypertension Weeks Of Treatment: 132 Clustered Wound: No Photos Wound Measurements Length: (cm) 12 Width: (cm) 15.5 Depth: (cm) 0.1 Area: (cm) 146.084 Volume: (cm) 14.608 % Reduction in Area: -88.4% % Reduction in Volume: 62.3% Epithelialization: Small (1-33%) Tunneling: No Undermining: No Wound Description Classification: Full Thickness Without Exposed Support Structures Wound Margin: Flat and Intact Exudate Amount: Large Exudate Type: Serosanguineous Exudate Color: red, brown Foul Odor After Cleansing: No Slough/Fibrino Yes Wound Bed Granulation Amount: Large (67-100%) Exposed Structure Granulation Quality: Red Fascia Exposed: No Necrotic Amount: Small (1-33%) Fat Layer (Subcutaneous Tissue) Exposed: Yes Necrotic Quality: Adherent Slough Tendon Exposed: No Muscle Exposed: No Joint Exposed: No Bone Exposed: No Electronic Signature(s) Signed: 04/11/2020 5:25:01 PM By: Sandre Kitty Signed: 04/12/2020 5:46:28 PM By: Levan Hurst RN, BSN Entered By: Sandre Kitty on 04/11/2020 17:20:17 -------------------------------------------------------------------------------- Wound Assessment  Details Patient Name: Date of Service: [name] 04/11/2020 12:30 PM Medical Record Number: [id number] Patient Account Number: [id number] Date of Birth/Sex: Treating RN: 1940/02/15 (80 y.o. Janyth Contes Primary Care Emy Angevine: [name] Other Clinician: Referring Jatniel Verastegui: Treating Melisssa Donner/Extender: [name] in Treatment: 132 Wound Status Wound Number: 5 Primary Vasculopathy Etiology: Wound Location: Right, Lateral Lower Leg Wound Status: Open Wounding Event: Gradually Appeared Comorbid Cataracts, Chronic Obstructive Pulmonary Disease (COPD), Date Acquired: 10/26/2017 History: Hypertension Weeks Of Treatment: 128 Clustered Wound: Yes Photos Wound Measurements Length: (cm) 6.8 Width: (cm) 13 Depth: (cm) 0.1 Clustered Quantity: 2 Area: (cm) 69.429 Volume: (cm) 6.943 % Reduction in Area: -27.9% % Reduction in Volume: -27.9% Epithelialization: Small (1-33%) Tunneling: No Undermining: No Wound Description Classification: Full Thickness Without Exposed Support Structures Wound Margin: Flat and Intact Exudate Amount: Large Exudate Type: Serosanguineous Exudate Color: red, brown Foul Odor After Cleansing: No Slough/Fibrino Yes Wound Bed Granulation Amount: Large (67-100%) Exposed Structure Granulation Quality: Red Fascia Exposed: No Necrotic Amount: Small (1-33%) Fat Layer (Subcutaneous Tissue) Exposed: Yes Necrotic Quality: Adherent Slough Tendon Exposed: No Muscle Exposed: No Joint Exposed: No Bone Exposed: No Electronic Signature(s) Signed: 04/11/2020 5:25:01 PM By: Sandre Kitty Signed: 04/12/2020 5:46:28 PM By: Levan Hurst RN, BSN Entered By: Sandre Kitty on 04/11/2020 17:19:48 -------------------------------------------------------------------------------- Vitals Details Patient Name: Date of Service: [name], GO RDO N H. 04/11/2020 12:30 PM Medical Record Number: [id number] Patient Account Number:  [id number] Date of Birth/Sex: Treating RN: Nov 13, 1940 (80 y.o. Janyth Contes Primary Care Tynesha Free: [name] Other Clinician: Referring Jael Kostick: Treating Lashell Moffitt/Extender: [name] in Treatment: 132 Vital Signs Time Taken: 12:53  Temperature (F): 97.7 Height (in): 71 Pulse (bpm): 61 Weight (lbs): 220 Respiratory Rate (breaths/min): 18 Body Mass Index (BMI): 30.7 Blood Pressure (mmHg): 165/73 Reference Range: 80 - 120 mg / dl Electronic Signature(s) Signed: 04/12/2020 5:46:28 PM By: Levan Hurst RN, BSN Entered By: Levan Hurst on 04/11/2020 12:59:00

## 2020-05-08 ENCOUNTER — Other Ambulatory Visit: Payer: Medicare PPO | Admitting: Internal Medicine

## 2020-05-08 ENCOUNTER — Emergency Department (HOSPITAL_COMMUNITY): Payer: Medicare PPO

## 2020-05-08 ENCOUNTER — Other Ambulatory Visit: Payer: Self-pay

## 2020-05-08 ENCOUNTER — Encounter (HOSPITAL_COMMUNITY): Payer: Self-pay

## 2020-05-08 ENCOUNTER — Inpatient Hospital Stay (HOSPITAL_COMMUNITY)
Admission: EM | Admit: 2020-05-08 | Discharge: 2020-05-10 | DRG: 535 | Disposition: A | Discharge status: Skilled Nursing Facility | Payer: Medicare PPO | Attending: Internal Medicine | Admitting: Internal Medicine

## 2020-05-08 DIAGNOSIS — Z7982 Long term (current) use of aspirin: Secondary | ICD-10-CM | POA: Diagnosis not present

## 2020-05-08 DIAGNOSIS — S7011XA Contusion of right thigh, initial encounter: Secondary | ICD-10-CM | POA: Diagnosis not present

## 2020-05-08 DIAGNOSIS — S32592A Other specified fracture of left pubis, initial encounter for closed fracture: Secondary | ICD-10-CM | POA: Diagnosis not present

## 2020-05-08 DIAGNOSIS — N400 Enlarged prostate without lower urinary tract symptoms: Secondary | ICD-10-CM | POA: Diagnosis present

## 2020-05-08 DIAGNOSIS — R351 Nocturia: Secondary | ICD-10-CM

## 2020-05-08 DIAGNOSIS — S3289XA Fracture of other parts of pelvis, initial encounter for closed fracture: Secondary | ICD-10-CM | POA: Diagnosis not present

## 2020-05-08 DIAGNOSIS — W109XXA Fall (on) (from) unspecified stairs and steps, initial encounter: Secondary | ICD-10-CM | POA: Diagnosis present

## 2020-05-08 DIAGNOSIS — Z87891 Personal history of nicotine dependence: Secondary | ICD-10-CM

## 2020-05-08 DIAGNOSIS — I10 Essential (primary) hypertension: Secondary | ICD-10-CM

## 2020-05-08 DIAGNOSIS — D500 Iron deficiency anemia secondary to blood loss (chronic): Secondary | ICD-10-CM | POA: Diagnosis not present

## 2020-05-08 DIAGNOSIS — F1021 Alcohol dependence, in remission: Secondary | ICD-10-CM

## 2020-05-08 DIAGNOSIS — W19XXXA Unspecified fall, initial encounter: Secondary | ICD-10-CM | POA: Diagnosis not present

## 2020-05-08 DIAGNOSIS — Z79891 Long term (current) use of opiate analgesic: Secondary | ICD-10-CM | POA: Diagnosis not present

## 2020-05-08 DIAGNOSIS — M25552 Pain in left hip: Secondary | ICD-10-CM | POA: Diagnosis not present

## 2020-05-08 DIAGNOSIS — S301XXA Contusion of abdominal wall, initial encounter: Secondary | ICD-10-CM | POA: Diagnosis not present

## 2020-05-08 DIAGNOSIS — S329XXA Fracture of unspecified parts of lumbosacral spine and pelvis, initial encounter for closed fracture: Secondary | ICD-10-CM | POA: Diagnosis present

## 2020-05-08 DIAGNOSIS — R41841 Cognitive communication deficit: Secondary | ICD-10-CM | POA: Diagnosis not present

## 2020-05-08 DIAGNOSIS — S7002XA Contusion of left hip, initial encounter: Secondary | ICD-10-CM | POA: Diagnosis not present

## 2020-05-08 DIAGNOSIS — Z20822 Contact with and (suspected) exposure to covid-19: Secondary | ICD-10-CM | POA: Diagnosis present

## 2020-05-08 DIAGNOSIS — S32502A Unspecified fracture of left pubis, initial encounter for closed fracture: Secondary | ICD-10-CM

## 2020-05-08 DIAGNOSIS — S32452A Displaced transverse fracture of left acetabulum, initial encounter for closed fracture: Secondary | ICD-10-CM

## 2020-05-08 DIAGNOSIS — Z7401 Bed confinement status: Secondary | ICD-10-CM | POA: Diagnosis not present

## 2020-05-08 DIAGNOSIS — Z0000 Encounter for general adult medical examination without abnormal findings: Secondary | ICD-10-CM | POA: Diagnosis not present

## 2020-05-08 DIAGNOSIS — Z79899 Other long term (current) drug therapy: Secondary | ICD-10-CM | POA: Diagnosis not present

## 2020-05-08 DIAGNOSIS — M2550 Pain in unspecified joint: Secondary | ICD-10-CM | POA: Diagnosis not present

## 2020-05-08 DIAGNOSIS — Z8249 Family history of ischemic heart disease and other diseases of the circulatory system: Secondary | ICD-10-CM | POA: Diagnosis not present

## 2020-05-08 DIAGNOSIS — N401 Enlarged prostate with lower urinary tract symptoms: Secondary | ICD-10-CM | POA: Diagnosis not present

## 2020-05-08 DIAGNOSIS — R2681 Unsteadiness on feet: Secondary | ICD-10-CM | POA: Diagnosis not present

## 2020-05-08 DIAGNOSIS — D649 Anemia, unspecified: Secondary | ICD-10-CM | POA: Diagnosis not present

## 2020-05-08 DIAGNOSIS — H903 Sensorineural hearing loss, bilateral: Secondary | ICD-10-CM

## 2020-05-08 DIAGNOSIS — S32402A Unspecified fracture of left acetabulum, initial encounter for closed fracture: Secondary | ICD-10-CM | POA: Diagnosis not present

## 2020-05-08 DIAGNOSIS — D709 Neutropenia, unspecified: Secondary | ICD-10-CM | POA: Diagnosis not present

## 2020-05-08 DIAGNOSIS — Z043 Encounter for examination and observation following other accident: Secondary | ICD-10-CM | POA: Diagnosis not present

## 2020-05-08 DIAGNOSIS — S7001XA Contusion of right hip, initial encounter: Secondary | ICD-10-CM | POA: Diagnosis not present

## 2020-05-08 DIAGNOSIS — S79912A Unspecified injury of left hip, initial encounter: Secondary | ICD-10-CM | POA: Diagnosis not present

## 2020-05-08 DIAGNOSIS — M1612 Unilateral primary osteoarthritis, left hip: Secondary | ICD-10-CM | POA: Diagnosis present

## 2020-05-08 DIAGNOSIS — R0902 Hypoxemia: Secondary | ICD-10-CM | POA: Diagnosis not present

## 2020-05-08 DIAGNOSIS — M6281 Muscle weakness (generalized): Secondary | ICD-10-CM | POA: Diagnosis not present

## 2020-05-08 DIAGNOSIS — S32502D Unspecified fracture of left pubis, subsequent encounter for fracture with routine healing: Secondary | ICD-10-CM | POA: Diagnosis not present

## 2020-05-08 DIAGNOSIS — Z9181 History of falling: Secondary | ICD-10-CM | POA: Diagnosis not present

## 2020-05-08 DIAGNOSIS — S32442A Displaced fracture of posterior column [ilioischial] of left acetabulum, initial encounter for closed fracture: Secondary | ICD-10-CM | POA: Diagnosis present

## 2020-05-08 DIAGNOSIS — S32432A Displaced fracture of anterior column [iliopubic] of left acetabulum, initial encounter for closed fracture: Secondary | ICD-10-CM | POA: Diagnosis not present

## 2020-05-08 LAB — CBC WITH DIFFERENTIAL/PLATELET
Abs Immature Granulocytes: 0.14 10*3/uL — ABNORMAL HIGH (ref 0.00–0.07)
Absolute Monocytes: 310 cells/uL (ref 200–950)
Basophils Absolute: 22 cells/uL (ref 0–200)
Basophils Absolute: 0 10*3/uL (ref 0.0–0.1)
Basophils Relative: 0.6 %
Basophils Relative: 0 %
Eosinophils Absolute: 90 cells/uL (ref 15–500)
Eosinophils Absolute: 0 10*3/uL (ref 0.0–0.5)
Eosinophils Relative: 2.5 %
Eosinophils Relative: 0 %
HCT: 34.4 % — ABNORMAL LOW (ref 38.5–50.0)
HCT: 33.2 % — ABNORMAL LOW (ref 39.0–52.0)
Hemoglobin: 11.6 g/dL — ABNORMAL LOW (ref 13.2–17.1)
Hemoglobin: 11.1 g/dL — ABNORMAL LOW (ref 13.0–17.0)
Immature Granulocytes: 1 %
Lymphocytes Relative: 3 %
Lymphs Abs: 580 cells/uL — ABNORMAL LOW (ref 850–3900)
Lymphs Abs: 0.4 10*3/uL — ABNORMAL LOW (ref 0.7–4.0)
MCH: 31.8 pg (ref 27.0–33.0)
MCH: 31.7 pg (ref 26.0–34.0)
MCHC: 33.7 g/dL (ref 32.0–36.0)
MCHC: 33.4 g/dL (ref 30.0–36.0)
MCV: 94.2 fL (ref 80.0–100.0)
MCV: 94.9 fL (ref 80.0–100.0)
MPV: 9.2 fL (ref 7.5–12.5)
Monocytes Absolute: 0.7 10*3/uL (ref 0.1–1.0)
Monocytes Relative: 8.6 %
Monocytes Relative: 5 %
Neutro Abs: 2599 cells/uL (ref 1500–7800)
Neutro Abs: 13.8 10*3/uL — ABNORMAL HIGH (ref 1.7–7.7)
Neutrophils Relative %: 72.2 %
Neutrophils Relative %: 91 %
Platelets: 185 10*3/uL (ref 140–400)
Platelets: 157 10*3/uL (ref 150–400)
RBC: 3.65 10*6/uL — ABNORMAL LOW (ref 4.20–5.80)
RBC: 3.5 MIL/uL — ABNORMAL LOW (ref 4.22–5.81)
RDW: 12.7 % (ref 11.0–15.0)
RDW: 13.2 % (ref 11.5–15.5)
Total Lymphocyte: 16.1 %
WBC: 3.6 10*3/uL — ABNORMAL LOW (ref 3.8–10.8)
WBC: 15.1 10*3/uL — ABNORMAL HIGH (ref 4.0–10.5)
nRBC: 0 % (ref 0.0–0.2)

## 2020-05-08 LAB — LIPID PANEL
Cholesterol: 136 mg/dL (ref <200)
HDL: 53 mg/dL (ref >=40)
LDL Cholesterol (Calc): 69 mg/dL (calc)
Non-HDL Cholesterol (Calc): 83 mg/dL (calc) (ref <130)
Total CHOL/HDL Ratio: 2.6 (calc) (ref <5.0)
Triglycerides: 55 mg/dL (ref <150)

## 2020-05-08 LAB — COMPLETE METABOLIC PANEL WITH GFR
AG Ratio: 1.6 (calc) (ref 1.0–2.5)
ALT: 10 U/L (ref 9–46)
AST: 13 U/L (ref 10–35)
Albumin: 4 g/dL (ref 3.6–5.1)
Alkaline phosphatase (APISO): 68 U/L (ref 35–144)
BUN: 17 mg/dL (ref 7–25)
CO2: 30 mmol/L (ref 20–32)
Calcium: 9.5 mg/dL (ref 8.6–10.3)
Chloride: 104 mmol/L (ref 98–110)
Creat: 0.94 mg/dL (ref 0.70–1.11)
GFR, Est African American: 88 mL/min/{1.73_m2} (ref >=60)
GFR, Est Non African American: 76 mL/min/{1.73_m2} (ref >=60)
Globulin: 2.5 g/dL (calc) (ref 1.9–3.7)
Glucose, Bld: 95 mg/dL (ref 65–99)
Potassium: 4.3 mmol/L (ref 3.5–5.3)
Sodium: 139 mmol/L (ref 135–146)
Total Bilirubin: 0.4 mg/dL (ref 0.2–1.2)
Total Protein: 6.5 g/dL (ref 6.1–8.1)

## 2020-05-08 LAB — BASIC METABOLIC PANEL
Anion gap: 10 (ref 5–15)
BUN: 17 mg/dL (ref 8–23)
CO2: 24 mmol/L (ref 22–32)
Calcium: 8.9 mg/dL (ref 8.9–10.3)
Chloride: 107 mmol/L (ref 98–111)
Creatinine, Ser: 0.81 mg/dL (ref 0.61–1.24)
GFR, Estimated: >60 mL/min (ref >60)
Glucose, Bld: 98 mg/dL (ref 70–99)
Potassium: 3.8 mmol/L (ref 3.5–5.1)
Sodium: 141 mmol/L (ref 135–145)

## 2020-05-08 LAB — RESP PANEL BY RT-PCR (FLU A&B, COVID) ARPGX2
Influenza A by PCR: NEGATIVE
Influenza B by PCR: NEGATIVE
SARS Coronavirus 2 by RT PCR: NEGATIVE

## 2020-05-08 LAB — PSA: PSA: 0.77 ng/mL (ref <=4.00)

## 2020-05-08 MED ORDER — MORPHINE SULFATE (PF) 4 MG/ML IV SOLN
4.0000 mg | Freq: Once | INTRAVENOUS | Status: AC
  Administered 2020-05-08: 4 mg via INTRAVENOUS
  Filled 2020-05-08: qty 1

## 2020-05-08 MED ORDER — ONDANSETRON HCL 4 MG/2ML IJ SOLN
4.0000 mg | Freq: Four times a day (QID) | INTRAMUSCULAR | Status: DC | PRN
Start: 2020-05-08 — End: 2020-05-10

## 2020-05-08 MED ORDER — MORPHINE SULFATE (PF) 2 MG/ML IV SOLN
2.0000 mg | INTRAVENOUS | Status: DC | PRN
  Administered 2020-05-08 – 2020-05-10 (×10): 2 mg via INTRAVENOUS
  Filled 2020-05-08 (×10): qty 1

## 2020-05-08 MED ORDER — SODIUM CHLORIDE 0.45 % IV SOLN: INTRAVENOUS | Status: DC

## 2020-05-08 MED ORDER — ENOXAPARIN SODIUM 40 MG/0.4ML ~~LOC~~ SOLN
40.0000 mg | SUBCUTANEOUS | Status: DC
  Administered 2020-05-09: 40 mg via SUBCUTANEOUS
  Filled 2020-05-08: qty 0.4

## 2020-05-08 MED ORDER — ONDANSETRON HCL 4 MG PO TABS: 4.0000 mg | ORAL_TABLET | Freq: Four times a day (QID) | ORAL | Status: DC | PRN

## 2020-05-08 NOTE — ED Triage Notes (Signed)
EMS reports from home, walking up stairs accidental slip and fall from fourth step landing on left hip. No visible obvious injury, no shortening rotation. Denies LOC, or head strike, no blood thinners. Pt states he cannot bare weight on left leg.  BP 178/86 HR 70 RR 16 Sp02 98 RA  20 L hand 170mcgm Fentanyl

## 2020-05-08 NOTE — ED Provider Notes (Signed)
[facility] DEPT Provider Note   CSN: [id number] Arrival date & time: 05/08/20  1610     History Chief Complaint  Patient presents with  . Fall  . Hip Pain    Carlos White. is a 80 y.o. male history of hypertension, here presenting with a fall.  Patient states that he is from home and was walking up the stairs and slipped and fell onto the left hip.  Patient denies any other injuries and has no loss of consciousness or head injury.  Patient states that he is not able to bear weight on the left leg afterwards.  Patient was given fentanyl 100 mcg prior to arrival.  Patient is not on any blood thinners  The history is provided by the patient.       Past Medical History:  Diagnosis Date  . Adenomatous polyp   . Atypical chest pain   . Epidermolysis bullosa   . History of alcoholism ([place])   . Hypertension   . Splenomegaly 08/18/2013    Patient Active Problem List   Diagnosis Date Noted  . Alcoholism in remission ([place]) 01/23/2015  . Musculoskeletal pain 01/23/2015  . Neutropenia ([place]) 08/05/2013  . Benign prostatic hypertrophy 07/13/2010  . Hypertension 07/13/2010  . Epidermolysis bullosa 06/14/2010  . Adenomatous colon polyp 06/14/2010    Past Surgical History:  Procedure Laterality Date  . left leg surgery         Family History  Problem Relation Age of Onset  . Hypertension Mother   . Diabetes Mother   . Heart disease Father     Social History   Tobacco Use  . Smoking status: Former Smoker    Packs/day: 2.00    Years: 20.00    Pack years: 40.00    Types: Cigarettes    Quit date: 01/14/1983    Years since quitting: 37.3  . Smokeless tobacco: Never Used  Substance Use Topics  . Alcohol use: No  . Drug use: No    Home Medications Prior to Admission medications   Medication Sig Start Date End Date Taking? Authorizing Provider  aspirin 81 MG chewable tablet Chew 81 mg by mouth daily.      [provider]   cetirizine (ZYRTEC) 10 MG tablet Take 10 mg by mouth daily.    [provider]  ciprofloxacin (CIPRO) 100 MG tablet Take 100 mg by mouth 2 (two) times daily.    [provider]  fish oil-omega-3 fatty acids 1000 MG capsule Take 2 g by mouth daily.    [provider]  losartan (COZAAR) 100 MG tablet TAKE 1 TABLET BY MOUTH EVERY DAY 08/31/19   Elby Showers, MD  multivitamin [facility]) per tablet Take 1 tablet by mouth daily.      [provider]  tamsulosin (FLOMAX) 0.4 MG CAPS capsule TAKE 1 CAPSULE BY MOUTH EVERY DAY 05/13/19   Elby Showers, MD  traMADol (ULTRAM) 50 MG tablet TAKE 1 TABLET 2-3 TIMES PER DAY AS NEEDED FOR PAIN 04/12/20   Elby Showers, MD    Allergies    Patient has no known allergies.  Review of Systems   Review of Systems  Musculoskeletal:       L hip pain   All other systems reviewed and are negative.   Physical Exam Updated Vital Signs BP (!) 167/70   Pulse 75   Temp 98.1 F (36.7 C) (Oral)   Resp 16   SpO2 100%  Physical Exam Vitals and nursing note reviewed.  Constitutional:      Comments: Uncomfortable  HENT:     Head: Normocephalic and atraumatic.     Nose: Nose normal.     Mouth/Throat:     Mouth: Mucous membranes are moist.  Eyes:     Extraocular Movements: Extraocular movements intact.     Pupils: Pupils are equal, round, and reactive to light.  Cardiovascular:     Rate and Rhythm: Normal rate and regular rhythm.     Pulses: Normal pulses.     Heart sounds: Normal heart sounds.  Pulmonary:     Effort: Pulmonary effort is normal.     Breath sounds: Normal breath sounds.  Abdominal:     General: Abdomen is flat.     Palpations: Abdomen is soft.  Musculoskeletal:     Cervical back: Normal range of motion and neck supple.     Comments: + Tenderness over the left hip.  Unable to range the hip.  Left hip appears to be shortened .  2+ DP pulses and able to wiggle toes on the left side.  Patient has  bilateral leg ulcers that are wrapped and managed by wound care.  I did not unwrap them  Skin:    Capillary Refill: Capillary refill takes less than 2 seconds.  Neurological:     General: No focal deficit present.  Psychiatric:        Mood and Affect: Mood normal.        Behavior: Behavior normal.     ED Results / Procedures / Treatments   Labs (all labs ordered are listed, but only abnormal results are displayed) Labs Reviewed  RESP PANEL BY RT-PCR (FLU A&B, COVID) ARPGX2  CBC WITH DIFFERENTIAL/PLATELET  BASIC METABOLIC PANEL    EKG None  Radiology No results found.  Procedures Procedures   Medications Ordered in ED Medications  morphine 4 MG/ML injection 4 mg (4 mg Intravenous Given 05/08/20 1740)    ED Course  I have reviewed the triage vital signs and the nursing notes.  Pertinent labs & imaging results that were available during my care of the patient were reviewed by me and considered in my medical decision making (see chart for details).    MDM Rules/Calculators/A&P                         Carlos White. is a 80 y.o. male here presenting with left hip pain after fall.  Patient had mechanical fall and landed on the left hip.  Concern for possible hip fracture.  Will get x-rays of the hip.  We will also get preop labs.  Patient not on blood thinners.  Will give pain medicine  9:11 PM X-ray showed left inferior pubic rami fracture.  Discussed with Dr. Lyla Glassing from Ortho.  He recommended CT which showed acetabular fracture as well as inferior pubic rami fracture and hematoma but there is no hip fracture.  He recommend pain control and physical therapy and likely will need rehab.  Patient is still in pain and required multiple doses of pain medicine.  At this point hospitalist will admit for pelvic fracture   Final Clinical Impression(s) / ED Diagnoses Final diagnoses:  None    Rx / DC Orders ED Discharge Orders    None       Drenda Freeze,  MD 05/08/20 2112

## 2020-05-08 NOTE — Progress Notes (Signed)
EDP called for L hip injury s/p mechanical fall at home. X-rays and CT scan show ABC acetabulum fx. Treat nonop TDWB LLE. Mobilize OOB with PT/OT. Suspect will need SNF. Full consult to follow.

## 2020-05-08 NOTE — H&P (Signed)
History and Physical   Carlos White. [id number] DOB: Sep 08, 1940 DOA: 05/08/2020  Referring MD/NP/PA: Dr. Darl Householder  PCP: Elby Showers, MD   Outpatient Specialists: None  Patient coming from: Home  Chief Complaint: Fall with hip pain  HPI: Carlos White. is a 80 y.o. male with medical history significant of remote alcohol abuse, hypertension, who lives at home with his wife.  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  Patient was walking up the stairs and slipped and fell onto the left hip.  He sustained left pelvic fracture at the pubic rami.  Patient is having pain at 10 out of 10.  Was brought to the ER and evaluated.  He was unable to put weight on the left foot.  He was able to use a walker belonging to his wife at home.  In the ER imaging shows pubic rami fracture.  Patient is being admitted for pain management and supportive care..  ED Course: Temperature 98.1 blood pressure 170/89, pulse 79 respiratory 19 oxygen sat 94% room air.  White count 15.2-11 point platelet 157.  Rest of the chemistry within normal.  Acute viral screen is negative.  Chest x-ray showed no acute findings.  X-ray of the left hip shows mildly displaced left anterior pubic ramus fracture with mild osteoarthritis of the left hip CT of the pelvis showed comminuted fractures involving the left anterior pelvis and acetabulum with involvement of the anterior middle and posterior columns of the left acetabulum.  Also comminuted displaced fractures of the inferior left pubic rami.  Patient has a large hematoma around the right hip involving the iliopsoas outrageous abductors and gluteus maximus.  Patient being admitted for further treatment.  Review of Systems: As per HPI otherwise 10 point review of systems negative.    Past Medical History:  Diagnosis Date  . Adenomatous polyp   . Atypical chest pain   . Epidermolysis bullosa   . History of alcoholism ([place])   . Hypertension   . Splenomegaly 08/18/2013     Past Surgical History:  Procedure Laterality Date  . left leg surgery       reports that he quit smoking about 37 years ago. His smoking use included cigarettes. He has a 40.00 pack-year smoking history. He has never used smokeless tobacco. He reports that he does not drink alcohol and does not use drugs.  No Known Allergies  Family History  Problem Relation Age of Onset  . Hypertension Mother   . Diabetes Mother   . Heart disease Father      Prior to Admission medications   Medication Sig Start Date End Date Taking? Authorizing Provider  aspirin 81 MG chewable tablet Chew 81 mg by mouth daily.      [provider]  cetirizine (ZYRTEC) 10 MG tablet Take 10 mg by mouth daily.    [provider]  ciprofloxacin (CIPRO) 100 MG tablet Take 100 mg by mouth 2 (two) times daily.    [provider]  fish oil-omega-3 fatty acids 1000 MG capsule Take 2 g by mouth daily.    [provider]  losartan (COZAAR) 100 MG tablet TAKE 1 TABLET BY MOUTH EVERY DAY 08/31/19   Elby Showers, MD  multivitamin [facility]) per tablet Take 1 tablet by mouth daily.      [provider]  tamsulosin (FLOMAX) 0.4 MG CAPS capsule TAKE 1 CAPSULE BY MOUTH EVERY DAY 05/13/19   Elby Showers, MD  traMADol (  ULTRAM) 50 MG tablet TAKE 1 TABLET 2-3 TIMES PER DAY AS NEEDED FOR PAIN 04/12/20   Elby Showers, MD    Physical Exam: Vitals:   05/08/20 1800 05/08/20 1830 05/08/20 2001 05/08/20 2100  BP: (!) 156/78 (!) 156/77 (!) 165/65 (!) 170/77  Pulse: 75 79 73 76  Resp: 16 16 18 19   Temp:      TempSrc:      SpO2: 97% 96% 98% 95%      Constitutional: NAD, calm, comfortable Vitals:   05/08/20 1800 05/08/20 1830 05/08/20 2001 05/08/20 2100  BP: (!) 156/78 (!) 156/77 (!) 165/65 (!) 170/77  Pulse: 75 79 73 76  Resp: 16 16 18 19   Temp:      TempSrc:      SpO2: 97% 96% 98% 95%   Eyes: PERRL, lids and conjunctivae normal ENMT: Mucous membranes are moist. Posterior  pharynx clear of any exudate or lesions.Normal dentition.  Neck: normal, supple, no masses, no thyromegaly Respiratory: clear to auscultation bilaterally, no wheezing, no crackles. Normal respiratory effort. No accessory muscle use.  Cardiovascular: Regular rate and rhythm, no murmurs / rubs / gallops. No extremity edema. 2+ pedal pulses. No carotid bruits.  Abdomen: no tenderness, no masses palpated. No hepatosplenomegaly. Bowel sounds positive.  Musculoskeletal: no clubbing / cyanosis.  Left lower extremity laterally rotated, shortened, large ecchymosis and hematoma around the left hip area, decreased range of motion.  , no contractures. Normal muscle tone.  Skin: no rashes, lesions, ulcers. No induration Neurologic: CN 2-12 grossly intact. Sensation intact, DTR normal. Strength 5/5 in all 4.  Psychiatric: Normal judgment and insight. Alert and oriented x 3. Normal mood.     Labs on Admission: I have personally reviewed following labs and imaging studies  CBC: Recent Labs  Lab 05/08/20 0922 05/08/20 1741  WBC 3.6* 15.1*  NEUTROABS 2,599 13.8*  HGB 11.6* 11.1*  HCT 34.4* 33.2*  MCV 94.2 94.9  PLT 185 630   Basic Metabolic Panel: Recent Labs  Lab 05/08/20 1741  NA 141  K 3.8  CL 107  CO2 24  GLUCOSE 98  BUN 17  CREATININE 0.81  CALCIUM 8.9   GFR: CrCl cannot be calculated (Unknown ideal weight.). Liver Function Tests: No results for input(s): AST, ALT, ALKPHOS, BILITOT, PROT, ALBUMIN in the last 168 hours. No results for input(s): LIPASE, AMYLASE in the last 168 hours. No results for input(s): AMMONIA in the last 168 hours. Coagulation Profile: No results for input(s): INR, PROTIME in the last 168 hours. Cardiac Enzymes: No results for input(s): CKTOTAL, CKMB, CKMBINDEX, TROPONINI in the last 168 hours. BNP (last 3 results) No results for input(s): PROBNP in the last 8760 hours. HbA1C: No results for input(s): HGBA1C in the last 72 hours. CBG: No results for  input(s): GLUCAP in the last 168 hours. Lipid Profile: No results for input(s): CHOL, HDL, LDLCALC, TRIG, CHOLHDL, LDLDIRECT in the last 72 hours. Thyroid Function Tests: No results for input(s): TSH, T4TOTAL, FREET4, T3FREE, THYROIDAB in the last 72 hours. Anemia Panel: No results for input(s): VITAMINB12, FOLATE, FERRITIN, TIBC, IRON, RETICCTPCT in the last 72 hours. Urine analysis:    Component Value Date/Time   BILIRUBINUR NEG 05/10/2019 1004   PROTEINUR Negative 05/10/2019 1004   UROBILINOGEN 0.2 05/10/2019 1004   NITRITE NEG 05/10/2019 1004   LEUKOCYTESUR Negative 05/10/2019 1004   Sepsis Labs: @LABRCNTIP (procalcitonin:4,lacticidven:4) ) Recent Results (from the past 240 hour(s))  Resp Panel by RT-PCR (Flu A&B, Covid) Nasopharyngeal Swab  Status: None   Collection Time: 05/08/20  5:40 PM   Specimen: Nasopharyngeal Swab; Nasopharyngeal(NP) swabs in vial transport medium  Result Value Ref Range Status   SARS Coronavirus 2 by RT PCR NEGATIVE NEGATIVE Final    Comment: (NOTE) SARS-CoV-2 target nucleic acids are NOT DETECTED.  The SARS-CoV-2 RNA is generally detectable in upper respiratory specimens during the acute phase of infection. The lowest concentration of SARS-CoV-2 viral copies this assay can detect is 138 copies/mL. A negative result does not preclude SARS-Cov-2 infection and should not be used as the sole basis for treatment or other patient management decisions. A negative result may occur with  improper specimen collection/handling, submission of specimen other than nasopharyngeal swab, presence of viral mutation(s) within the areas targeted by this assay, and inadequate number of viral copies(<138 copies/mL). A negative result must be combined with clinical observations, patient history, and epidemiological information. The expected result is Negative.  Fact Sheet for Patients:  EntrepreneurPulse.com.au  Fact Sheet for Healthcare  Providers:  IncredibleEmployment.be  This test is no t yet approved or cleared by the [place] FDA and  has been authorized for detection and/or diagnosis of SARS-CoV-2 by FDA under an Emergency Use Authorization (EUA). This EUA will remain  in effect (meaning this test can be used) for the duration of the COVID-19 declaration under Section 564(b)(1) of the Act, 21 U.S.C.section 360bbb-3(b)(1), unless the authorization is terminated  or revoked sooner.       Influenza A by PCR NEGATIVE NEGATIVE Final   Influenza B by PCR NEGATIVE NEGATIVE Final    Comment: (NOTE) The Xpert Xpress SARS-CoV-2/FLU/RSV plus assay is intended as an aid in the diagnosis of influenza from Nasopharyngeal swab specimens and should not be used as a sole basis for treatment. Nasal washings and aspirates are unacceptable for Xpert Xpress SARS-CoV-2/FLU/RSV testing.  Fact Sheet for Patients: EntrepreneurPulse.com.au  Fact Sheet for Healthcare Providers: IncredibleEmployment.be  This test is not yet approved or cleared by the [place] FDA and has been authorized for detection and/or diagnosis of SARS-CoV-2 by FDA under an Emergency Use Authorization (EUA). This EUA will remain in effect (meaning this test can be used) for the duration of the COVID-19 declaration under Section 564(b)(1) of the Act, 21 U.S.C. section 360bbb-3(b)(1), unless the authorization is terminated or revoked.  Performed at [facility], [place] [address], [place], [place] [address]      Radiological Exams on Admission: DG Chest 1 View  Result Date: 05/08/2020 CLINICAL DATA:  Fall. EXAM: CHEST  1 VIEW COMPARISON:  March 24, 2008. FINDINGS: The heart size and mediastinal contours are within normal limits. Both lungs are clear. The visualized skeletal structures are unremarkable. IMPRESSION: No active disease. Electronically Signed   By: Marijo Conception  M.D.   On: 05/08/2020 19:01   CT PELVIS WO CONTRAST  Result Date: 05/08/2020 CLINICAL DATA:  Slip and fall injury with pelvic trauma. EXAM: CT PELVIS WITHOUT CONTRAST TECHNIQUE: Multidetector CT imaging of the pelvis was performed following the standard protocol without intravenous contrast. COMPARISON:  Left hip radiographs 05/08/2020. CT abdomen and pelvis 05/25/2013. FINDINGS: Urinary Tract: No bladder wall thickening or filling defect. No bladder stones. Bowel: Visualized portions of the colon and small bowel are not abnormally distended. Vascular/Lymphatic: Vascular calcifications in the iliac vessels and distal aorta. Reproductive:  Prostate gland is not enlarged. Other:  No free air or free fluid in the pelvis. Musculoskeletal: Comminuted fractures involving the left anterior pelvis and acetabulum. There  is involvement of the anterior, medial, and posterior columns of the left acetabulum. Mild displacement of the fracture fragments. Comminuted displaced fractures also demonstrated in the inferior pubic ramus on the left with associated deformity. Symphysis pubis and SI joints are nondisplaced. The right pelvis appears intact. No fractures involving the left proximal femur. No hip dislocation. Degenerative changes in the lower lumbar spine. Large hematoma around the right hip involving the ileus psoas, obturators, adductor, and gluteus maximus spaces. Hematoma in the iliopsoas region displaces the bladder towards the right. IMPRESSION: 1. Comminuted fractures involving the left anterior pelvis and acetabulum with involvement of the anterior, medial, and posterior columns of the left acetabulum. Comminuted displaced fractures of the inferior left pubic ramus. 2. Large hematoma around the right hip involving the ileus psoas, obturators, adductor, and gluteus maximus spaces. Hematoma in the iliopsoas region displaces the bladder towards the right. 3. Aortic atherosclerosis. Aortic Atherosclerosis  (ICD10-I70.0). Electronically Signed   By: Lucienne Capers M.D.   On: 05/08/2020 20:37   DG Hip Unilat W or Wo Pelvis 2-3 Views Left  Result Date: 05/08/2020 CLINICAL DATA:  Left hip pain after fall. EXAM: DG HIP (WITH OR WITHOUT PELVIS) 2-3V LEFT COMPARISON:  None. FINDINGS: Mild osteophyte formation is seen involving the left hip. Mildly displaced fracture is seen involving the left inferior pubic ramus. No definite evidence of proximal femur fracture is noted. IMPRESSION: Mildly displaced left inferior pubic ramus fracture. Mild osteoarthritis of the left hip. Electronically Signed   By: Marijo Conception M.D.   On: 05/08/2020 19:00      Assessment/Plan Principal Problem:   Pelvic fracture (HCC) Active Problems:   Benign prostatic hyperplasia   Hypertension   Alcoholism in remission ([place])     #1 fall with left pubic ramus fracture: No surgical.  Patient will be admitted for pain control.  PT OT.  Orthopedics already consulted and confirmed no surgical fracture.  Patient hoping to be able to go home with home health rather than skilled facility but will await PT consult and recommendation.  #2 essential hypertension: Confirm and resume home regimen.  #3 remote history of alcoholism: Patient has quit.  Currently no new issues.  #4 mechanical fall: PT and OT like #1 above.   DVT prophylaxis: Subcutaneous heparin Code Status: Full code Family Communication: No family at bedside Disposition Plan: Home Consults called: Dr. Lyla Glassing, orthopedics Admission status: Inpatient  Severity of Illness: The appropriate patient status for this patient is INPATIENT. Inpatient status is judged to be reasonable and necessary in order to provide the required intensity of service to ensure the patient's safety. The patient's presenting symptoms, physical exam findings, and initial radiographic and laboratory data in the context of their chronic comorbidities is felt to place them at high risk for  further clinical deterioration. Furthermore, it is not anticipated that the patient will be medically stable for discharge from the hospital within 2 midnights of admission. The following factors support the patient status of inpatient.   " The patient's presenting symptoms include pain on the left hip after a fall. " The worrisome physical exam findings include tenderness swelling and shortening of the left lower extremity. " The initial radiographic and laboratory data are worrisome because of confirmed fractures of the pubic ramus. " The chronic co-morbidities include hypertension.   * I certify that at the point of admission it is my clinical judgment that the patient will require inpatient hospital care spanning beyond 2 midnights from the point of  admission due to high intensity of service, high risk for further deterioration and high frequency of surveillance required.Barbette Merino MD [facility] Pager [phone]  If 7PM-7AM, please contact night-coverage [email or web address] Password [facility]  05/08/2020, 9:31 PM

## 2020-05-08 NOTE — ED Notes (Signed)
Pt. Called out for help to the room. Pt. IV came out, gauze placed on hand with tape and bleeding stopped.

## 2020-05-08 NOTE — ED Notes (Signed)
Patient transported to CT 

## 2020-05-09 ENCOUNTER — Telehealth: Payer: Self-pay | Admitting: Internal Medicine

## 2020-05-09 LAB — COMPREHENSIVE METABOLIC PANEL
ALT: 15 U/L (ref 0–44)
AST: 18 U/L (ref 15–41)
Albumin: 3.4 g/dL — ABNORMAL LOW (ref 3.5–5.0)
Alkaline Phosphatase: 56 U/L (ref 38–126)
Anion gap: 6 (ref 5–15)
BUN: 17 mg/dL (ref 8–23)
CO2: 27 mmol/L (ref 22–32)
Calcium: 8.9 mg/dL (ref 8.9–10.3)
Chloride: 104 mmol/L (ref 98–111)
Creatinine, Ser: 0.73 mg/dL (ref 0.61–1.24)
GFR, Estimated: >60 mL/min (ref >60)
Glucose, Bld: 130 mg/dL — ABNORMAL HIGH (ref 70–99)
Potassium: 4 mmol/L (ref 3.5–5.1)
Sodium: 137 mmol/L (ref 135–145)
Total Bilirubin: 0.8 mg/dL (ref 0.3–1.2)
Total Protein: 5.9 g/dL — ABNORMAL LOW (ref 6.5–8.1)

## 2020-05-09 LAB — CBC
HCT: 28.8 % — ABNORMAL LOW (ref 39.0–52.0)
Hemoglobin: 9.4 g/dL — ABNORMAL LOW (ref 13.0–17.0)
MCH: 31.2 pg (ref 26.0–34.0)
MCHC: 32.6 g/dL (ref 30.0–36.0)
MCV: 95.7 fL (ref 80.0–100.0)
Platelets: 146 10*3/uL — ABNORMAL LOW (ref 150–400)
RBC: 3.01 MIL/uL — ABNORMAL LOW (ref 4.22–5.81)
RDW: 13.2 % (ref 11.5–15.5)
WBC: 7.3 10*3/uL (ref 4.0–10.5)
nRBC: 0 % (ref 0.0–0.2)

## 2020-05-09 MED ORDER — OXYCODONE HCL 5 MG PO TABS
5.0000 mg | ORAL_TABLET | Freq: Four times a day (QID) | ORAL | Status: DC | PRN
  Administered 2020-05-09 – 2020-05-10 (×4): 5 mg via ORAL
  Filled 2020-05-09 (×4): qty 1

## 2020-05-09 MED ORDER — LOSARTAN POTASSIUM 50 MG PO TABS
50.0000 mg | ORAL_TABLET | Freq: Every day | ORAL | Status: DC
  Administered 2020-05-09 – 2020-05-10 (×2): 50 mg via ORAL
  Filled 2020-05-09 (×2): qty 1

## 2020-05-09 MED ORDER — SENNOSIDES-DOCUSATE SODIUM 8.6-50 MG PO TABS
1.0000 | ORAL_TABLET | Freq: Two times a day (BID) | ORAL | Status: DC
  Administered 2020-05-09 – 2020-05-10 (×2): 1 via ORAL
  Filled 2020-05-09 (×2): qty 1

## 2020-05-09 MED ORDER — POLYETHYLENE GLYCOL 3350 17 G PO PACK
17.0000 g | PACK | Freq: Every day | ORAL | Status: DC
  Administered 2020-05-09 – 2020-05-10 (×2): 17 g via ORAL
  Filled 2020-05-09 (×2): qty 1

## 2020-05-09 MED ORDER — TAMSULOSIN HCL 0.4 MG PO CAPS
0.4000 mg | ORAL_CAPSULE | Freq: Every day | ORAL | Status: DC
  Administered 2020-05-09 – 2020-05-10 (×2): 0.4 mg via ORAL
  Filled 2020-05-09 (×2): qty 1

## 2020-05-09 NOTE — Evaluation (Signed)
Physical Therapy Evaluation Patient Details Name: Carlos White. MRN: [id number] DOB: [birth year] Today's Date: 05/09/2020   History of Present Illness  80 y.o. male history of hypertension, here presenting with a fall.  Patient states that he is from home and was walking up the stairs and slipped and fell onto the left hip.  Pt found to have pelvic fractures per imaging: Comminuted fractures involving the left anterior pelvis and  acetabulum with involvement of the anterior, medial, and posterior  columns of the left acetabulum. Comminuted displaced fractures of  the inferior left pubic ramus.  Clinical Impression  Pt admitted with above diagnosis.  Pt currently with functional limitations due to the deficits listed below (see PT Problem List). Pt will benefit from skilled PT to increase their independence and safety with mobility to allow discharge to the venue listed below.   Pt requiring at least min assist and now has TDWB status.  Pt is typically independent and taking care of his spouse at baseline.  Pt would benefit from SNF for rehab d/c.      Follow Up Recommendations SNF    Equipment Recommendations  None recommended by PT    Recommendations for Other Services       Precautions / Restrictions Precautions Precautions: Fall Restrictions Weight Bearing Restrictions: Yes LLE Weight Bearing: Touchdown weight bearing      Mobility  Bed Mobility Overal bed mobility: Needs Assistance Bed Mobility: Supine to Sit     Supine to sit: Min guard;HOB elevated     General bed mobility comments: pt self assisted left LE    Transfers Overall transfer level: Needs assistance Equipment used: Rolling walker (2 wheeled) Transfers: Sit to/from Stand Sit to Stand: Min assist         General transfer comment: verbal cues for hand placement, educated pt on TDWB status prior to standing, assist to rise and steady  Ambulation/Gait Ambulation/Gait assistance: Min Management consultant (Feet): 25 Feet Assistive device: Rolling walker (2 wheeled) Gait Pattern/deviations: Step-to pattern;Antalgic     General Gait Details: assisted for weakness and stability; pt able to maintain NWB mostly however with fatigue more TDWB and PWB just before return to recliner; UEs also fatigued quickly  Stairs            Wheelchair Mobility    Modified Rankin (Stroke Patients Only)       Balance Overall balance assessment: History of Falls                                           Pertinent Vitals/Pain Pain Assessment: Faces Faces Pain Scale: Hurts little more Pain Location: left pelvis Pain Descriptors / Indicators: Sore;Grimacing;Guarding Pain Intervention(s): Monitored during session;Repositioned;Premedicated before session    Home Living Family/patient expects to be discharged to:: Private residence Living Arrangements: Spouse/significant other   Type of Home: House Home Access: Stairs to enter   Technical brewer of Steps: 3-4 Home Layout: Two level Home Equipment: Environmental consultant - 2 wheels;Walker - 4 wheels      Prior Function Level of Independence: Independent         Comments: 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     Hand Dominance        Extremity/Trunk Assessment        Lower Extremity Assessment Lower Extremity Assessment: LLE deficits/detail LLE Deficits / Details: pain with leg movement;  self assisted with bed mobility       Communication   Communication: No difficulties  Cognition Arousal/Alertness: Awake/alert Behavior During Therapy: WFL for tasks assessed/performed Overall Cognitive Status: Within Functional Limits for tasks assessed                                        General Comments      Exercises     Assessment/Plan    PT Assessment Patient needs continued PT services  PT Problem List Decreased balance;Pain;Decreased knowledge of use of DME;Decreased activity  tolerance;Decreased strength;Decreased mobility;Decreased knowledge of precautions       PT Treatment Interventions DME instruction;Gait training;Balance training;Therapeutic exercise;Functional mobility training;Therapeutic activities;Patient/family education    PT Goals (Current goals can be found in the Care Plan section)  Acute Rehab PT Goals PT Goal Formulation: With patient Time For Goal Achievement: 05/23/20 Potential to Achieve Goals: Good    Frequency Min 3X/week   Barriers to discharge        Co-evaluation               AM-PAC PT "6 Clicks" Mobility  Outcome Measure Help needed turning from your back to your side while in a flat bed without using bedrails?: A Little Help needed moving from lying on your back to sitting on the side of a flat bed without using bedrails?: A Little Help needed moving to and from a bed to a chair (including a wheelchair)?: A Lot Help needed standing up from a chair using your arms (e.g., wheelchair or bedside chair)?: A Lot Help needed to walk in hospital room?: A Lot Help needed climbing 3-5 steps with a railing? : Total 6 Click Score: 13    End of Session Equipment Utilized During Treatment: Gait belt Activity Tolerance: Patient tolerated treatment well Patient left: in chair;with call bell/phone within reach;with chair alarm set Nurse Communication: Mobility status PT Visit Diagnosis: Other abnormalities of gait and mobility (R26.89)    Time: 1275-1700 PT Time Calculation (min) (ACUTE ONLY): 21 min   Charges:   PT Evaluation $PT Eval Low Complexity: 1 Low        Kati PT, DPT Acute Rehabilitation Services Pager: [phone] Office: [phone]  York Ram E 05/09/2020, 3:16 PM

## 2020-05-09 NOTE — NC FL2 (Signed)
  [place] LEVEL OF CARE SCREENING TOOL     IDENTIFICATION  Patient Name: Carlos White. Birthdate: 08/24/1940 Sex: male Admission Date (Current Location): 05/08/2020  [facility] White White C and [place] Number:  Carlos White:  [facility],  [place], [place]      Provider Number: [id number]  Attending Physician Name and White:  Carlos Friendly, MD  Relative Name and Phone Number:       Current Level of Care: Hospital Recommended Level of Care: [place] Prior Approval Number:    Date Approved/Denied:   PASRR Number: [id number]  Discharge Plan: SNF    Current Diagnoses: Patient Active Problem List   Diagnosis Date Noted  . Pelvic fracture ([place]) 05/08/2020  . Alcoholism in remission ([place]) 01/23/2015  . Musculoskeletal pain 01/23/2015  . Neutropenia ([place]) 08/05/2013  . Benign prostatic hyperplasia 07/13/2010  . Hypertension 07/13/2010  . Epidermolysis bullosa 06/14/2010  . Adenomatous colon polyp 06/14/2010    Orientation RESPIRATION BLADDER Height & Weight     White,Time,Situation,Place  Normal Continent Weight:   Height:     BEHAVIORAL SYMPTOMS/MOOD NEUROLOGICAL BOWEL NUTRITION STATUS      Continent    AMBULATORY STATUS COMMUNICATION OF NEEDS Skin   Extensive Assist Verbally Normal                       Personal Care Assistance Level of Assistance  Bathing,Dressing Bathing Assistance: Limited assistance   Dressing Assistance: Limited assistance     Functional Limitations Info             SPECIAL CARE FACTORS FREQUENCY  PT (By licensed PT),OT (By licensed OT)     PT Frequency: 5x/wk OT Frequency: 5x/wk            Contractures Contractures Info: Not present    Additional Factors Info  Code Status,Allergies Code Status Info: Full Allergies Info: NKDA           Current Medications (05/09/2020):  This is the current hospital active medication list Current  Facility-Administered Medications  Medication Dose Route Frequency Provider Last Rate Last Admin  . 0.45 % sodium chloride infusion   Intravenous Continuous Carlos Reach, MD 100 mL/hr at 05/09/20 0838 New Bag at 05/09/20 8338  . enoxaparin (LOVENOX) injection 40 mg  40 mg Subcutaneous Q24H Carlos Romney L, MD   40 mg at 05/09/20 0837  . morphine 2 MG/ML injection 2 mg  2 mg Intravenous Q2H PRN Carlos Reach, MD   2 mg at 05/09/20 1309  . ondansetron (ZOFRAN) tablet 4 mg  4 mg Oral Q6H PRN Carlos Reach, MD       Or  . ondansetron (ZOFRAN) injection 4 mg  4 mg Intravenous Q6H PRN Carlos Reach, MD         Discharge Medications: Please see discharge summary for a list of discharge medications.  Relevant Imaging Results:  Relevant Lab Results:   Additional Information SS# [id number]; pt fully COVID vaccinated and two boosters  Carlos Self, LCSW

## 2020-05-09 NOTE — TOC Initial Note (Addendum)
Transition of Care (TOC) - Initial/Assessment Note    Patient Details  Name: Carlos White. MRN: [id number] Date of Birth: 05/13/1940  Transition of Care [facility]) CM/SW Contact:    Lennart Pall, LCSW Phone Number: 05/09/2020, 1:14 PM  Clinical Narrative:                 Met with pt today to introduce self/ TOC role.  Pt very pleasant and we spoke about both immediate dc needs and longer term planning for himself and wife. Pt confirms that he is the caregiver for his wife who has "memory issues".  Describes her mobility as "unsafe" but she is ambulatory "but I never let her walk without me right next to her."  Wife's granddaughter, Altha Harm, does offer ~ 8 hours of assist per week. He notes that they have looked at area IL and ALFs but have not moved forward on any definite plans.  He acknowledges that "this (new fall/ fx) changes things and I need to make plans." For the immediate time, wife's daughter is coming to stay with her for ~ 1 week +.  Pt is agreeable with PT recommendation for SNF rehab and we discussed facility preferences.  I asked about need to speak with another family member but pt states, "Not needed.  I know I need to do this for me and they will take care of Denice Paradise."   Will begin SNF bed search.  Addendum: Insurance auth begun with ref # [id number]  Expected Discharge Plan: Skilled Nursing Facility Barriers to Discharge: Continued Medical Work up   Patient Goals and CMS Choice Patient states their goals for this hospitalization and ongoing recovery are:: short term goal is to go to short term rehab so he can return to his role as caregiver for his wife CMS Medicare.gov Compare Post Acute Care list provided to:: Patient Choice offered to / list presented to : Patient  Expected Discharge Plan and Services Expected Discharge Plan: [place] In-house Referral: Clinical Social Work   Post Acute Care Choice: [place] Living arrangements for the past  2 months: [place]                                      Prior Living Arrangements/Services Living arrangements for the past 2 months: Single Family Home Lives with:: Spouse Patient language and need for interpreter reviewed:: Yes Do you feel safe going back to the place where you live?: Yes      Need for Family Participation in Patient Care: Yes (Comment) Care giver support system in place?: No (comment)   Criminal Activity/Legal Involvement Pertinent to Current Situation/Hospitalization: No - Comment as needed  Activities of Daily Living Home Assistive Devices/Equipment: Medical sales representative (specify type),Wheelchair,Bedside commode/3-in-1,Shower chair with back ADL Screening (condition at time of admission) Patient's cognitive ability adequate to safely complete daily activities?: Yes Is the patient deaf or have difficulty hearing?: Yes (wears bilateral hearing aids) Does the patient have difficulty seeing, even when wearing glasses/contacts?: Yes Does the patient have difficulty concentrating, remembering, or making decisions?: No Patient able to express need for assistance with ADLs?: Yes Does the patient have difficulty dressing or bathing?: No Independently performs ADLs?: Yes (appropriate for developmental age) (before fall) Communication: Independent Dressing (OT): Needs assistance Is this a change from baseline?: Change from baseline, expected to last >3 days Grooming: Independent Feeding: Independent Bathing: Needs assistance  Is this a change from baseline?: Change from baseline, expected to last >3 days Toileting: Needs assistance Is this a change from baseline?: Change from baseline, expected to last >3days In/Out Bed: Needs assistance Is this a change from baseline?: Change from baseline, expected to last >3 days Walks in Home: Needs assistance Is this a change from baseline?: Change from baseline, expected to last >3 days Does the patient  have difficulty walking or climbing stairs?: Yes Weakness of Legs: Both Weakness of Arms/Hands: None  Permission Sought/Granted Permission sought to share information with : Facility Art therapist granted to share information with : Yes, Verbal Permission Granted     Permission granted to share info w AGENCY: SNFs        Emotional Assessment Appearance:: Appears stated age Attitude/Demeanor/Rapport: Gracious,Engaged Affect (typically observed): Accepting,Appropriate,Pleasant Orientation: : Oriented to Self,Oriented to Place,Oriented to  Time,Oriented to Situation Alcohol / Substance Use: Not Applicable Psych Involvement: No (comment)  Admission diagnosis:  Pelvic fracture ([place]) [S32.9XXA] Closed displaced transverse fracture of left acetabulum, initial encounter [facility]) [S32.452A] Patient Active Problem List   Diagnosis Date Noted  . Pelvic fracture ([place]) 05/08/2020  . Alcoholism in remission ([place]) 01/23/2015  . Musculoskeletal pain 01/23/2015  . Neutropenia ([place]) 08/05/2013  . Benign prostatic hyperplasia 07/13/2010  . Hypertension 07/13/2010  . Epidermolysis bullosa 06/14/2010  . Adenomatous colon polyp 06/14/2010   PCP:  Elby Showers, MD Pharmacy:   [id number]: [phone]: [phone]    Social Determinants of Health (SDOH) Interventions    Readmission Risk Interventions Readmission Risk Prevention Plan 05/09/2020  Post Dischage Appt Complete  Medication Screening Complete  Transportation Screening Complete  Some recent data might be hidden

## 2020-05-09 NOTE — Plan of Care (Signed)
Plan of care discussed with pt.

## 2020-05-09 NOTE — Progress Notes (Signed)
PROGRESS NOTE  [name]. YIR:[id number] DOB: 1941/01/08 DOA: 05/08/2020 PCP: Elby Showers, MD  HPI/Recap of past 24 hours: HPI from Dr Ozella Almond Lapinski Brooke Bonito. is a 80 y.o. male with medical history significant of remote alcohol abuse, hypertension, who lives at home with his wife.  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  Patient was walking up the stairs and slipped and fell onto the left hip.  He sustained left pelvic fracture at the pubic rami.  Patient is having pain at 10 out of 10.  Was brought to the ER and evaluated.  He was unable to put weight on the left foot. In the ER, X-ray of the left hip shows mildly displaced left anterior pubic ramus fracture with mild osteoarthritis of the left hip CT of the pelvis showed comminuted fractures involving the left anterior pelvis and acetabulum with involvement of the anterior middle and posterior columns of the left acetabulum.  Also comminuted displaced fractures of the inferior left pubic rami.  Patient has a large hematoma around the right hip. Patient is being admitted for pain management and supportive care.    Today, patient still complaining of hip pain especially with movement/ambulation.  Denies any chest pain, shortness of breath, abdominal pain, nausea/vomiting, fever/chills.     Assessment/Plan: Principal Problem:   Pelvic fracture (HCC) Active Problems:   Benign prostatic hyperplasia   Hypertension   Alcoholism in remission ([place])   Left pubic ramus fracture s/p mechanical fall X-ray of the left hip shows mildly displaced left anterior pubic ramus fracture with mild osteoarthritis of the left hip  CT of the pelvis showed comminuted fractures involving the left anterior pelvis and acetabulum with involvement of the anterior middle and posterior columns of the left acetabulum.  Also comminuted displaced fractures of the inferior left pubic rami Orthopedics consulted, nonsurgical management Continue pain  management PT/OT  Noted large hematoma around right hip CT showed large hematoma around right hip Monitor closely Hold anticoagulation for now  Normocytic anemia Baseline hemoglobin around 11, noted acute drop Monitor CBC closely as patients with hematoma Daily CBC  Hypertension Continue home losartan at a lower dose 50 mg daily, may increase to 100 mg  BPH Continue Flomax      Estimated body mass index is 27.84 kg/m as calculated from the following:   Height as of 05/10/19: 5\' 10"  (1.778 m).   Weight as of 05/10/19: 88 kg.     Code Status: Full  Family Communication: None at bedside  Disposition Plan: Status is: Inpatient  Remains inpatient appropriate because:Inpatient level of care appropriate due to severity of illness   Dispo: The patient is from: Home              Anticipated d/c is to: SNF              Patient currently is not medically stable to d/c.   Difficult to place patient No    Consultants:  Orthopedics  Procedures:  None  Antimicrobials:  None  DVT prophylaxis: SCDs, hold Lovenox for now   Objective: Vitals:   05/09/20 0232 05/09/20 0620 05/09/20 1042 05/09/20 1326  BP: (!) 153/70 (!) 176/70 128/70 (!) 154/73  Pulse: 63 77 77 74  Resp: 18 16 16 18   Temp: 98.1 F (36.7 C) 98.3 F (36.8 C) 98.1 F (36.7 C) 97.7 F (36.5 C)  TempSrc: Oral Oral Oral Oral  SpO2: 97% 100% 99% 99%    Intake/Output  Summary (Last 24 hours) at 05/09/2020 1412 Last data filed at 05/09/2020 1000 Gross per 24 hour  Intake 240 ml  Output 550 ml  Net -310 ml   There were no vitals filed for this visit.  Exam:  General: NAD   Cardiovascular: S1, S2 present  Respiratory: CTAB  Abdomen: Soft, nontender, nondistended, bowel sounds present  Musculoskeletal: No bilateral pedal edema noted, bilateral leg ulcer with Ace wrap, tenderness over left hip which appears to be shortened  Skin: Normal  Psychiatry: Normal mood    Data  Reviewed: CBC: Recent Labs  Lab 05/08/20 0922 05/08/20 1741 05/09/20 0307  WBC 3.6* 15.1* 7.3  NEUTROABS 2,599 13.8*  --   HGB 11.6* 11.1* 9.4*  HCT 34.4* 33.2* 28.8*  MCV 94.2 94.9 95.7  PLT 185 157 123456*   Basic Metabolic Panel: Recent Labs  Lab 05/08/20 0922 05/08/20 1741 05/09/20 0307  NA 139 141 137  K 4.3 3.8 4.0  CL 104 107 104  CO2 30 24 27   GLUCOSE 95 98 130*  BUN 17 17 17   CREATININE 0.94 0.81 0.73  CALCIUM 9.5 8.9 8.9   GFR: CrCl cannot be calculated (Unknown ideal weight.). Liver Function Tests: Recent Labs  Lab 05/08/20 0922 05/09/20 0307  AST 13 18  ALT 10 15  ALKPHOS  --  56  BILITOT 0.4 0.8  PROT 6.5 5.9*  ALBUMIN  --  3.4*   No results for input(s): LIPASE, AMYLASE in the last 168 hours. No results for input(s): AMMONIA in the last 168 hours. Coagulation Profile: No results for input(s): INR, PROTIME in the last 168 hours. Cardiac Enzymes: No results for input(s): CKTOTAL, CKMB, CKMBINDEX, TROPONINI in the last 168 hours. BNP (last 3 results) No results for input(s): PROBNP in the last 8760 hours. HbA1C: No results for input(s): HGBA1C in the last 72 hours. CBG: No results for input(s): GLUCAP in the last 168 hours. Lipid Profile: Recent Labs    05/08/20 0922  CHOL 136  HDL 53  LDLCALC 69  TRIG 55  CHOLHDL 2.6   Thyroid Function Tests: No results for input(s): TSH, T4TOTAL, FREET4, T3FREE, THYROIDAB in the last 72 hours. Anemia Panel: No results for input(s): VITAMINB12, FOLATE, FERRITIN, TIBC, IRON, RETICCTPCT in the last 72 hours. Urine analysis:    Component Value Date/Time   BILIRUBINUR NEG 05/10/2019 1004   PROTEINUR Negative 05/10/2019 1004   UROBILINOGEN 0.2 05/10/2019 1004   NITRITE NEG 05/10/2019 1004   LEUKOCYTESUR Negative 05/10/2019 1004   Sepsis Labs: @LABRCNTIP (procalcitonin:4,lacticidven:4)  ) Recent Results (from the past 240 hour(s))  Resp Panel by RT-PCR (Flu A&B, Covid) Nasopharyngeal Swab      Status: None   Collection Time: 05/08/20  5:40 PM   Specimen: Nasopharyngeal Swab; Nasopharyngeal(NP) swabs in vial transport medium  Result Value Ref Range Status   SARS Coronavirus 2 by RT PCR NEGATIVE NEGATIVE Final    Comment: (NOTE) SARS-CoV-2 target nucleic acids are NOT DETECTED.  The SARS-CoV-2 RNA is generally detectable in upper respiratory specimens during the acute phase of infection. The lowest concentration of SARS-CoV-2 viral copies this assay can detect is 138 copies/mL. A negative result does not preclude SARS-Cov-2 infection and should not be used as the sole basis for treatment or other patient management decisions. A negative result may occur with  improper specimen collection/handling, submission of specimen other than nasopharyngeal swab, presence of viral mutation(s) within the areas targeted by this assay, and inadequate number of viral copies(<138 copies/mL). A negative result  must be combined with clinical observations, patient history, and epidemiological information. The expected result is Negative.  Fact Sheet for Patients:  EntrepreneurPulse.com.au  Fact Sheet for Healthcare Providers:  IncredibleEmployment.be  This test is no t yet approved or cleared by the [place] FDA and  has been authorized for detection and/or diagnosis of SARS-CoV-2 by FDA under an Emergency Use Authorization (EUA). This EUA will remain  in effect (meaning this test can be used) for the duration of the COVID-19 declaration under Section 564(b)(1) of the Act, 21 U.S.C.section 360bbb-3(b)(1), unless the authorization is terminated  or revoked sooner.       Influenza A by PCR NEGATIVE NEGATIVE Final   Influenza B by PCR NEGATIVE NEGATIVE Final    Comment: (NOTE) The Xpert Xpress SARS-CoV-2/FLU/RSV plus assay is intended as an aid in the diagnosis of influenza from Nasopharyngeal swab specimens and should not be used as a sole basis  for treatment. Nasal washings and aspirates are unacceptable for Xpert Xpress SARS-CoV-2/FLU/RSV testing.  Fact Sheet for Patients: EntrepreneurPulse.com.au  Fact Sheet for Healthcare Providers: IncredibleEmployment.be  This test is not yet approved or cleared by the [place] FDA and has been authorized for detection and/or diagnosis of SARS-CoV-2 by FDA under an Emergency Use Authorization (EUA). This EUA will remain in effect (meaning this test can be used) for the duration of the COVID-19 declaration under Section 564(b)(1) of the Act, 21 U.S.C. section 360bbb-3(b)(1), unless the authorization is terminated or revoked.  Performed at [facility] [place] [address] [place], [place] [address]       Studies: DG Chest 1 View  Result Date: 05/08/2020 CLINICAL DATA:  Fall. EXAM: CHEST  1 VIEW COMPARISON:  March 24, 2008. FINDINGS: The heart size and mediastinal contours are within normal limits. Both lungs are clear. The visualized skeletal structures are unremarkable. IMPRESSION: No active disease. Electronically Signed   By: Marijo Conception M.D.   On: 05/08/2020 19:01   CT PELVIS WO CONTRAST  Result Date: 05/08/2020 CLINICAL DATA:  Slip and fall injury with pelvic trauma. EXAM: CT PELVIS WITHOUT CONTRAST TECHNIQUE: Multidetector CT imaging of the pelvis was performed following the standard protocol without intravenous contrast. COMPARISON:  Left hip radiographs 05/08/2020. CT abdomen and pelvis 05/25/2013. FINDINGS: Urinary Tract: No bladder wall thickening or filling defect. No bladder stones. Bowel: Visualized portions of the colon and small bowel are not abnormally distended. Vascular/Lymphatic: Vascular calcifications in the iliac vessels and distal aorta. Reproductive:  Prostate gland is not enlarged. Other:  No free air or free fluid in the pelvis. Musculoskeletal: Comminuted fractures involving the left anterior pelvis and  acetabulum. There is involvement of the anterior, medial, and posterior columns of the left acetabulum. Mild displacement of the fracture fragments. Comminuted displaced fractures also demonstrated in the inferior pubic ramus on the left with associated deformity. Symphysis pubis and SI joints are nondisplaced. The right pelvis appears intact. No fractures involving the left proximal femur. No hip dislocation. Degenerative changes in the lower lumbar spine. Large hematoma around the right hip involving the ileus psoas, obturators, adductor, and gluteus maximus spaces. Hematoma in the iliopsoas region displaces the bladder towards the right. IMPRESSION: 1. Comminuted fractures involving the left anterior pelvis and acetabulum with involvement of the anterior, medial, and posterior columns of the left acetabulum. Comminuted displaced fractures of the inferior left pubic ramus. 2. Large hematoma around the right hip involving the ileus psoas, obturators, adductor, and gluteus maximus spaces. Hematoma in the iliopsoas  region displaces the bladder towards the right. 3. Aortic atherosclerosis. Aortic Atherosclerosis (ICD10-I70.0). Electronically Signed   By: Lucienne Capers M.D.   On: 05/08/2020 20:37   DG Hip Unilat W or Wo Pelvis 2-3 Views Left  Result Date: 05/08/2020 CLINICAL DATA:  Left hip pain after fall. EXAM: DG HIP (WITH OR WITHOUT PELVIS) 2-3V LEFT COMPARISON:  None. FINDINGS: Mild osteophyte formation is seen involving the left hip. Mildly displaced fracture is seen involving the left inferior pubic ramus. No definite evidence of proximal femur fracture is noted. IMPRESSION: Mildly displaced left inferior pubic ramus fracture. Mild osteoarthritis of the left hip. Electronically Signed   By: Marijo Conception M.D.   On: 05/08/2020 19:00    Scheduled Meds: . enoxaparin (LOVENOX) injection  40 mg Subcutaneous Q24H    Continuous Infusions: . sodium chloride 100 mL/hr at 05/09/20 0838     LOS: 1 day      Alma Friendly, MD [facility]  If 7PM-7AM, please contact night-coverage [email or web address] 05/09/2020, 2:12 PM

## 2020-05-09 NOTE — Telephone Encounter (Signed)
Patient is calling the office to advise [place] of his hospitalization for a hip fracture which we were informed of by notes in Epic last evening.   He is once again asking for advice. It is best he consult with Orthopedist and Social Work along with his family in making a decision about post hospitalization care. We have previously filled out forms for his wife to go to [facility]. [facility] have also considered [facility]. Despite completing paperwork on 2 occasions, they never moved.   His wife is 38 years older than he is and has issues with chronic back pain and COPD. Her granddaughter had been coming to help them a few hours a week at home but they mostly lived independently and he was driving. She had received PT and home health services last year. He was considering moving to a one story home. This home has some rather steep stairs as I understand it to the second level where their bedroom is.  He has children from a previous marriage but I am not sure where they are living but not in [place] to my recollection.  We have heard today that wife's daughter from a previous marriage is coming from out of town to help take care of wife at home.   The final decision needs to be made considering all of these factors by patient and his family.  MJB,MD

## 2020-05-10 ENCOUNTER — Encounter: Payer: Medicare PPO | Admitting: Internal Medicine

## 2020-05-10 DIAGNOSIS — N400 Enlarged prostate without lower urinary tract symptoms: Secondary | ICD-10-CM | POA: Diagnosis not present

## 2020-05-10 DIAGNOSIS — M2550 Pain in unspecified joint: Secondary | ICD-10-CM | POA: Diagnosis not present

## 2020-05-10 DIAGNOSIS — R41841 Cognitive communication deficit: Secondary | ICD-10-CM | POA: Diagnosis not present

## 2020-05-10 DIAGNOSIS — D709 Neutropenia, unspecified: Secondary | ICD-10-CM | POA: Diagnosis not present

## 2020-05-10 DIAGNOSIS — Z7401 Bed confinement status: Secondary | ICD-10-CM | POA: Diagnosis not present

## 2020-05-10 DIAGNOSIS — S32592D Other specified fracture of left pubis, subsequent encounter for fracture with routine healing: Secondary | ICD-10-CM | POA: Diagnosis not present

## 2020-05-10 DIAGNOSIS — R262 Difficulty in walking, not elsewhere classified: Secondary | ICD-10-CM | POA: Diagnosis not present

## 2020-05-10 DIAGNOSIS — F1021 Alcohol dependence, in remission: Secondary | ICD-10-CM | POA: Diagnosis not present

## 2020-05-10 DIAGNOSIS — D649 Anemia, unspecified: Secondary | ICD-10-CM | POA: Diagnosis not present

## 2020-05-10 DIAGNOSIS — M6281 Muscle weakness (generalized): Secondary | ICD-10-CM | POA: Diagnosis not present

## 2020-05-10 DIAGNOSIS — K5900 Constipation, unspecified: Secondary | ICD-10-CM | POA: Diagnosis not present

## 2020-05-10 DIAGNOSIS — I10 Essential (primary) hypertension: Secondary | ICD-10-CM | POA: Diagnosis not present

## 2020-05-10 DIAGNOSIS — S3289XA Fracture of other parts of pelvis, initial encounter for closed fracture: Secondary | ICD-10-CM | POA: Diagnosis not present

## 2020-05-10 DIAGNOSIS — S32412A Displaced fracture of anterior wall of left acetabulum, initial encounter for closed fracture: Secondary | ICD-10-CM | POA: Diagnosis not present

## 2020-05-10 DIAGNOSIS — Z9181 History of falling: Secondary | ICD-10-CM | POA: Diagnosis not present

## 2020-05-10 DIAGNOSIS — R2681 Unsteadiness on feet: Secondary | ICD-10-CM | POA: Diagnosis not present

## 2020-05-10 DIAGNOSIS — S32502A Unspecified fracture of left pubis, initial encounter for closed fracture: Secondary | ICD-10-CM | POA: Diagnosis not present

## 2020-05-10 DIAGNOSIS — I83029 Varicose veins of left lower extremity with ulcer of unspecified site: Secondary | ICD-10-CM | POA: Diagnosis not present

## 2020-05-10 DIAGNOSIS — S32402A Unspecified fracture of left acetabulum, initial encounter for closed fracture: Secondary | ICD-10-CM | POA: Diagnosis not present

## 2020-05-10 DIAGNOSIS — E119 Type 2 diabetes mellitus without complications: Secondary | ICD-10-CM | POA: Diagnosis not present

## 2020-05-10 DIAGNOSIS — S32502D Unspecified fracture of left pubis, subsequent encounter for fracture with routine healing: Secondary | ICD-10-CM | POA: Diagnosis not present

## 2020-05-10 DIAGNOSIS — I83019 Varicose veins of right lower extremity with ulcer of unspecified site: Secondary | ICD-10-CM | POA: Diagnosis not present

## 2020-05-10 DIAGNOSIS — Z20822 Contact with and (suspected) exposure to covid-19: Secondary | ICD-10-CM | POA: Diagnosis not present

## 2020-05-10 LAB — CBC WITH DIFFERENTIAL/PLATELET
Abs Immature Granulocytes: 0.03 10*3/uL (ref 0.00–0.07)
Basophils Absolute: 0 10*3/uL (ref 0.0–0.1)
Basophils Relative: 1 %
Eosinophils Absolute: 0.3 10*3/uL (ref 0.0–0.5)
Eosinophils Relative: 6 %
HCT: 26.2 % — ABNORMAL LOW (ref 39.0–52.0)
Hemoglobin: 8.7 g/dL — ABNORMAL LOW (ref 13.0–17.0)
Immature Granulocytes: 1 %
Lymphocytes Relative: 13 %
Lymphs Abs: 0.7 10*3/uL (ref 0.7–4.0)
MCH: 31.1 pg (ref 26.0–34.0)
MCHC: 33.2 g/dL (ref 30.0–36.0)
MCV: 93.6 fL (ref 80.0–100.0)
Monocytes Absolute: 0.6 10*3/uL (ref 0.1–1.0)
Monocytes Relative: 11 %
Neutro Abs: 4 10*3/uL (ref 1.7–7.7)
Neutrophils Relative %: 68 %
Platelets: 131 10*3/uL — ABNORMAL LOW (ref 150–400)
RBC: 2.8 MIL/uL — ABNORMAL LOW (ref 4.22–5.81)
RDW: 13.2 % (ref 11.5–15.5)
WBC: 5.8 10*3/uL (ref 4.0–10.5)
nRBC: 0 % (ref 0.0–0.2)

## 2020-05-10 LAB — BASIC METABOLIC PANEL
Anion gap: 7 (ref 5–15)
BUN: 14 mg/dL (ref 8–23)
CO2: 26 mmol/L (ref 22–32)
Calcium: 8.9 mg/dL (ref 8.9–10.3)
Chloride: 103 mmol/L (ref 98–111)
Creatinine, Ser: 0.85 mg/dL (ref 0.61–1.24)
GFR, Estimated: >60 mL/min (ref >60)
Glucose, Bld: 112 mg/dL — ABNORMAL HIGH (ref 70–99)
Potassium: 3.9 mmol/L (ref 3.5–5.1)
Sodium: 136 mmol/L (ref 135–145)

## 2020-05-10 LAB — RESP PANEL BY RT-PCR (FLU A&B, COVID) ARPGX2
Influenza A by PCR: NEGATIVE
Influenza B by PCR: NEGATIVE
SARS Coronavirus 2 by RT PCR: NEGATIVE

## 2020-05-10 MED ORDER — OXYCODONE HCL 5 MG PO TABS: 5.0000 mg | ORAL_TABLET | Freq: Four times a day (QID) | ORAL | 0 refills | Status: AC | PRN

## 2020-05-10 MED ORDER — SENNOSIDES-DOCUSATE SODIUM 8.6-50 MG PO TABS: 1.0000 | ORAL_TABLET | Freq: Every day | ORAL | Status: DC

## 2020-05-10 MED ORDER — POLYETHYLENE GLYCOL 3350 17 G PO PACK: 17.0000 g | PACK | Freq: Every day | ORAL | 0 refills | Status: DC

## 2020-05-10 NOTE — Consult Note (Signed)
ORTHOPAEDIC CONSULTATION  REQUESTING PHYSICIAN: Alma Friendly, MD  PCP:  Elby Showers, MD  Chief Complaint: Left hip injury  HPI: Carlos White. is a 80 y.o. male who complains of left hip pain after a mechanical fall at home.  He had left hip pain and inability to weight-bear.  He was brought to the emergency department at [facility].  X-rays and CT scan of the left hip revealed an associated both column left acetabular fracture with a concentric hip.  He was admitted to the hospitalist service for placement.  Orthopedic consultation was placed for management of his left hip fracture.  Past Medical History:  Diagnosis Date  . Adenomatous polyp   . Atypical chest pain   . Epidermolysis bullosa   . History of alcoholism ([place])   . Hypertension   . Splenomegaly 08/18/2013   Past Surgical History:  Procedure Laterality Date  . left leg surgery     Social History   Socioeconomic History  . Marital status: Single    Spouse name: Not on file  . Number of children: Not on file  . Years of education: Not on file  . Highest education level: Not on file  Occupational History  . Not on file  Tobacco Use  . Smoking status: Former Smoker    Packs/day: 2.00    Years: 20.00    Pack years: 40.00    Types: Cigarettes    Quit date: 01/14/1983    Years since quitting: 37.3  . Smokeless tobacco: Never Used  Substance and Sexual Activity  . Alcohol use: No  . Drug use: No  . Sexual activity: Not on file  Other Topics Concern  . Not on file  Social History Narrative  . Not on file   Social Determinants of Health   Financial Resource Strain: Not on file  Food Insecurity: Not on file  Transportation Needs: Not on file  Physical Activity: Not on file  Stress: Not on file  Social Connections: Not on file   Family History  Problem Relation Age of Onset  . Hypertension Mother   . Diabetes Mother   . Heart disease Father    No Known Allergies Prior to  Admission medications   Medication Sig Start Date End Date Taking? Authorizing Provider  acetaminophen (TYLENOL) 500 MG tablet Take 1,000 mg by mouth every 6 (six) hours as needed for moderate pain.   Yes [provider]  aspirin 81 MG chewable tablet Chew 81 mg by mouth daily.   Yes [provider]  cetirizine (ZYRTEC) 10 MG tablet Take 10 mg by mouth daily.   Yes [provider]  losartan (COZAAR) 100 MG tablet TAKE 1 TABLET BY MOUTH EVERY DAY Patient taking differently: Take 100 mg by mouth daily. 08/31/19  Yes Baxley, Cresenciano Lick, MD  Multiple Vitamin (MULTIVITAMIN WITH MINERALS) TABS tablet Take 1 tablet by mouth daily.   Yes [provider]  Propylene Glycol (SYSTANE BALANCE OP) Place 1 drop into both eyes daily as needed (dry eyes).   Yes [provider]  tamsulosin (FLOMAX) 0.4 MG CAPS capsule TAKE 1 CAPSULE BY MOUTH EVERY DAY Patient taking differently: Take 0.4 mg by mouth daily. 05/13/19  Yes Baxley, Cresenciano Lick, MD  traMADol (ULTRAM) 50 MG tablet TAKE 1 TABLET 2-3 TIMES PER DAY AS NEEDED FOR PAIN Patient taking differently: Take 50 mg by mouth every 12 (twelve) hours as needed for moderate pain. 04/12/20  Yes Baxley,  Cresenciano Lick, MD  oxyCODONE (OXY IR/ROXICODONE) 5 MG immediate release tablet Take 1 tablet (5 mg total) by mouth every 6 (six) hours as needed for up to 7 days for moderate pain, severe pain or breakthrough pain. 05/10/20 05/17/20  Alma Friendly, MD  polyethylene glycol (MIRALAX / GLYCOLAX) 17 g packet Take 17 g by mouth daily. 05/11/20   Alma Friendly, MD  senna-docusate (SENOKOT-S) 8.6-50 MG tablet Take 1 tablet by mouth at bedtime. 05/10/20   Alma Friendly, MD   DG Chest 1 View  Result Date: 05/08/2020 CLINICAL DATA:  Fall. EXAM: CHEST  1 VIEW COMPARISON:  March 24, 2008. FINDINGS: The heart size and mediastinal contours are within normal limits. Both lungs are clear. The visualized skeletal structures are unremarkable.  IMPRESSION: No active disease. Electronically Signed   By: Marijo Conception M.D.   On: 05/08/2020 19:01   CT PELVIS WO CONTRAST  Result Date: 05/08/2020 CLINICAL DATA:  Slip and fall injury with pelvic trauma. EXAM: CT PELVIS WITHOUT CONTRAST TECHNIQUE: Multidetector CT imaging of the pelvis was performed following the standard protocol without intravenous contrast. COMPARISON:  Left hip radiographs 05/08/2020. CT abdomen and pelvis 05/25/2013. FINDINGS: Urinary Tract: No bladder wall thickening or filling defect. No bladder stones. Bowel: Visualized portions of the colon and small bowel are not abnormally distended. Vascular/Lymphatic: Vascular calcifications in the iliac vessels and distal aorta. Reproductive:  Prostate gland is not enlarged. Other:  No free air or free fluid in the pelvis. Musculoskeletal: Comminuted fractures involving the left anterior pelvis and acetabulum. There is involvement of the anterior, medial, and posterior columns of the left acetabulum. Mild displacement of the fracture fragments. Comminuted displaced fractures also demonstrated in the inferior pubic ramus on the left with associated deformity. Symphysis pubis and SI joints are nondisplaced. The right pelvis appears intact. No fractures involving the left proximal femur. No hip dislocation. Degenerative changes in the lower lumbar spine. Large hematoma around the right hip involving the ileus psoas, obturators, adductor, and gluteus maximus spaces. Hematoma in the iliopsoas region displaces the bladder towards the right. IMPRESSION: 1. Comminuted fractures involving the left anterior pelvis and acetabulum with involvement of the anterior, medial, and posterior columns of the left acetabulum. Comminuted displaced fractures of the inferior left pubic ramus. 2. Large hematoma around the right hip involving the ileus psoas, obturators, adductor, and gluteus maximus spaces. Hematoma in the iliopsoas region displaces the bladder  towards the right. 3. Aortic atherosclerosis. Aortic Atherosclerosis (ICD10-I70.0). Electronically Signed   By: Lucienne Capers M.D.   On: 05/08/2020 20:37   DG Hip Unilat W or Wo Pelvis 2-3 Views Left  Result Date: 05/08/2020 CLINICAL DATA:  Left hip pain after fall. EXAM: DG HIP (WITH OR WITHOUT PELVIS) 2-3V LEFT COMPARISON:  None. FINDINGS: Mild osteophyte formation is seen involving the left hip. Mildly displaced fracture is seen involving the left inferior pubic ramus. No definite evidence of proximal femur fracture is noted. IMPRESSION: Mildly displaced left inferior pubic ramus fracture. Mild osteoarthritis of the left hip. Electronically Signed   By: Marijo Conception M.D.   On: 05/08/2020 19:00    Positive ROS: All other systems have been reviewed and were otherwise negative with the exception of those mentioned in the HPI and as above.  Physical Exam: General: Alert, no acute distress Cardiovascular: No pedal edema Respiratory: No cyanosis, no use of accessory musculature GI: No organomegaly, abdomen is soft and non-tender Skin: No lesions in the area of  chief complaint Neurologic: Sensation intact distally Psychiatric: Patient is competent for consent with normal mood and affect Lymphatic: No axillary or cervical lymphadenopathy  MUSCULOSKELETAL: Examination of the left hip reveals no skin wounds or lesions.  He has mild pain with logrolling of the hip.  He is unable to perform a straight leg raise secondary to pain.  No instability with AP or lateral compression of the pelvis.  Neurovascular intact distally.  Assessment: Left associated both column acetabular fracture, amenable to conservative management  Plan: I discussed the findings with the patient.  We will treat the fracture nonoperatively.  Touchdown weightbearing left lower extremity with walker.  Mobilize out of bed with PT/OT.  Recommend chemical DVT prophylaxis such as Lovenox for 30 days.  Follow-up in the office 2  weeks after discharge for repeat radiographs.  All questions solicited and answered.    Bertram Savin, MD [phone]    05/10/2020 1:56 PM

## 2020-05-10 NOTE — TOC Transition Note (Signed)
Transition of Care [facility]) - CM/SW Discharge Note  Patient Details  Name: Carlos White. MRN: [id number] Date of Birth: January 23, 1940  Transition of Care [facility]) CM/SW Contact:  Sherie Don, LCSW Phone Number: 05/10/2020, 1:18 PM  Clinical Narrative: Patient is medically stable for discharge to SNF. [facility] is patient's only bed offer and patient is agreeable. Nikki with [facility] confirmed bed availability for today. Patient will go to room 111 and the number for report is [phone]. Discharge summary, discharge orders, and SNF transfer report faxed to [facility] in hub. Patient's COVID test is negative.  CSW spoke with Bernadene Bell to complete insurance authorization. Patient is approved for a start date of 05/10/20 with a review date of 05/14/20. Clinicals for continued stay will be faxed to Horace Porteous at [phone]. Discharge packet done. Medical necessity form completed; PTAR scheduled. RN updated. TOC signing off.  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 CMS Medicare.gov Compare Post Acute Care list provided to:: Patient Choice offered to / list presented to : Patient  Discharge Placement PASRR number recieved: 05/09/20       Patient chooses bed at: [facility] Patient to be transferred to facility by: PTAR Patient and family notified of of transfer: 05/10/20  Discharge Plan and Services In-house Referral: Clinical Social Work Post Acute Care Choice: [facility]          DME Arranged: N/A DME Agency: NA  Readmission Risk Interventions Readmission Risk Prevention Plan 05/09/2020  Post Dischage Appt Complete  Medication Screening Complete  Transportation Screening Complete  Some recent  data might be hidden

## 2020-05-10 NOTE — Progress Notes (Signed)
Telephone report called to [facility]. Spoke with nurse Olu for report. Patient not in any acute distress at this time. Vitals are stable. Pt stable for transfer.

## 2020-05-10 NOTE — Discharge Summary (Signed)
Discharge Summary  Carlos White. NIO:[id number] DOB: December 05, 1940  PCP: Elby Showers, MD  Admit date: 05/08/2020 Discharge date: 05/10/2020  Time spent: 35 mins  Recommendations for Outpatient Follow-up:  1. Follow-up with PCP  Discharge Diagnoses:  Active Hospital Problems   Diagnosis Date Noted  . Pelvic fracture ([place]) 05/08/2020  . Alcoholism in remission ([place]) 01/23/2015  . Benign prostatic hyperplasia 07/13/2010  . Hypertension 07/13/2010    Resolved Hospital Problems  No resolved problems to display.    Discharge Condition: Stable  Diet recommendation: Heart healthy  Vitals:   05/09/20 2006 05/10/20 0542  BP: (!) 147/60 (!) 146/66  Pulse: 73 72  Resp: 17 18  Temp: 98.2 F (36.8 C) 97.8 F (36.6 C)  SpO2: 99% 96%    History of present illness:  Carlos White.is a 80 y.o.malewith medical history significant ofremote alcohol abuse, hypertension, who lives at home with his wife. 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 Patient was walking up the stairs and slipped and fell onto the left hip. He sustained left pelvic fracture at the pubic rami. Patient is having pain at 10 out of 10. Was brought to the ER and evaluated. He was unable to put weight on the left foot.In the ER, X-ray of the left hip shows mildly displaced left anterior pubic ramus fracture with mild osteoarthritis of the left hip CT of the pelvis showed comminuted fractures involving the left anterior pelvis and acetabulum with involvement of the anterior middle and posterior columns of the left acetabulum. Also comminuted displaced fractures of the inferior left pubic rami. Patient has a large hematoma around the right hip. Patient is being admitted for pain management and supportive care.    Today, patient reports pain is controlled with narcotics, pain worse on ambulation/movement.  Patient denies any chest pain, shortness of breath, abdominal pain, nausea/vomiting, fever/chills.   Patient very eager to be discharged to SNF for rehab in order to subsequently be discharged home early to take care of his wife.  Discussed the importance of fall prevention once patient returns home.    Hospital Course:  Principal Problem:   Pelvic fracture ([place]) Active Problems:   Benign prostatic hyperplasia   Hypertension   Alcoholism in remission ([place])   Left pubic ramus fracture s/p mechanical fall X-ray of the left hip shows mildly displaced left anterior pubic ramus fracture with mild osteoarthritis of the left hip  CT of the pelvis showed comminuted fractures involving the left anterior pelvis and acetabulum with involvement of the anterior middle and posterior columns of the left acetabulum. Also comminuted displaced fractures of the inferior left pubic rami Orthopedics consulted, nonsurgical management Continue pain management PT/OT, touchdown weightbearing  Noted large hematoma around right hip CT showed large hematoma around right hip Monitor very closely No anticoagulation for now, hold home aspirin Anticipate hematoma should resolve on its own SNF to monitor CBC as often as possible  Normocytic anemia Baseline hemoglobin around 11, noted acute drop Monitor CBC closely as patients with hematoma  Hypertension Continue home losartan 100 mg  Chronic bilateral lower extremity wound Continue daily wound dressing Follows in wound clinic  BPH Continue Flomax      Estimated body mass index is 27.84 kg/m as calculated from the following:   Height as of 05/10/19: 5\' 10"  (1.778 m).   Weight as of 05/10/19: 88 kg.    Procedures:  None  Consultations:  Orthopedics  Discharge Exam: BP (!) 146/66 (BP  Location: Left Arm)   Pulse 72   Temp 97.8 F (36.6 C) (Oral)   Resp 18   SpO2 96%     General: NAD Cardiovascular: S1, S2 present Respiratory: CTA B     Discharge Instructions You were cared for by a hospitalist during your hospital stay. If  you have any questions about your discharge medications or the care you received while you were in the hospital after you are discharged, you can call the unit and asked to speak with the hospitalist on call if the hospitalist that took care of you is not available. Once you are discharged, your primary care physician will handle any further medical issues. Please note that NO REFILLS for any discharge medications will be authorized once you are discharged, as it is imperative that you return to your primary care physician (or establish a relationship with a primary care physician if you do not have one) for your aftercare needs so that they can reassess your need for medications and monitor your lab values.  Discharge Instructions    Diet - low sodium heart healthy   Complete by: As directed    Discharge wound care:   Complete by: As directed    Daily dressing with Ace wraps   Increase activity slowly   Complete by: As directed      Allergies as of 05/10/2020   No Known Allergies     Medication List    STOP taking these medications   aspirin 81 MG chewable tablet   traMADol 50 MG tablet Commonly known as: ULTRAM     TAKE these medications   acetaminophen 500 MG tablet Commonly known as: TYLENOL Take 1,000 mg by mouth every 6 (six) hours as needed for moderate pain.   cetirizine 10 MG tablet Commonly known as: ZYRTEC Take 10 mg by mouth daily.   losartan 100 MG tablet Commonly known as: COZAAR TAKE 1 TABLET BY MOUTH EVERY DAY   multivitamin with minerals Tabs tablet Take 1 tablet by mouth daily.   oxyCODONE 5 MG immediate release tablet Commonly known as: Oxy IR/ROXICODONE Take 1 tablet (5 mg total) by mouth every 6 (six) hours as needed for up to 7 days for moderate pain, severe pain or breakthrough pain.   polyethylene glycol 17 g packet Commonly known as: MIRALAX / GLYCOLAX Take 17 g by mouth daily. Start taking on: May 11, 2020   senna-docusate 8.6-50 MG  tablet Commonly known as: Senokot-S Take 1 tablet by mouth at bedtime.   SYSTANE BALANCE OP Place 1 drop into both eyes daily as needed (dry eyes).   tamsulosin 0.4 MG Caps capsule Commonly known as: FLOMAX TAKE 1 CAPSULE BY MOUTH EVERY DAY            Discharge Care Instructions  (From admission, onward)         Start     Ordered   05/10/20 0000  Discharge wound care:       Comments: Daily dressing with Ace wraps   05/10/20 1208         No Known Allergies  Follow-up Information    Baxley, Cresenciano Lick, MD. Schedule an appointment as soon as possible for a visit in 1 week(s).   Specialty: Internal Medicine Contact information: [address] [id number] [phone]                The results of significant diagnostics from this hospitalization (including imaging, microbiology, ancillary and laboratory) are listed  below for reference.    Significant Diagnostic Studies: DG Chest 1 View  Result Date: 05/08/2020 CLINICAL DATA:  Fall. EXAM: CHEST  1 VIEW COMPARISON:  March 24, 2008. FINDINGS: The heart size and mediastinal contours are within normal limits. Both lungs are clear. The visualized skeletal structures are unremarkable. IMPRESSION: No active disease. Electronically Signed   By: Marijo Conception M.D.   On: 05/08/2020 19:01   CT PELVIS WO CONTRAST  Result Date: 05/08/2020 CLINICAL DATA:  Slip and fall injury with pelvic trauma. EXAM: CT PELVIS WITHOUT CONTRAST TECHNIQUE: Multidetector CT imaging of the pelvis was performed following the standard protocol without intravenous contrast. COMPARISON:  Left hip radiographs 05/08/2020. CT abdomen and pelvis 05/25/2013. FINDINGS: Urinary Tract: No bladder wall thickening or filling defect. No bladder stones. Bowel: Visualized portions of the colon and small bowel are not abnormally distended. Vascular/Lymphatic: Vascular calcifications in the iliac vessels and distal aorta. Reproductive:  Prostate gland  is not enlarged. Other:  No free air or free fluid in the pelvis. Musculoskeletal: Comminuted fractures involving the left anterior pelvis and acetabulum. There is involvement of the anterior, medial, and posterior columns of the left acetabulum. Mild displacement of the fracture fragments. Comminuted displaced fractures also demonstrated in the inferior pubic ramus on the left with associated deformity. Symphysis pubis and SI joints are nondisplaced. The right pelvis appears intact. No fractures involving the left proximal femur. No hip dislocation. Degenerative changes in the lower lumbar spine. Large hematoma around the right hip involving the ileus psoas, obturators, adductor, and gluteus maximus spaces. Hematoma in the iliopsoas region displaces the bladder towards the right. IMPRESSION: 1. Comminuted fractures involving the left anterior pelvis and acetabulum with involvement of the anterior, medial, and posterior columns of the left acetabulum. Comminuted displaced fractures of the inferior left pubic ramus. 2. Large hematoma around the right hip involving the ileus psoas, obturators, adductor, and gluteus maximus spaces. Hematoma in the iliopsoas region displaces the bladder towards the right. 3. Aortic atherosclerosis. Aortic Atherosclerosis (ICD10-I70.0). Electronically Signed   By: Lucienne Capers M.D.   On: 05/08/2020 20:37   DG Hip Unilat W or Wo Pelvis 2-3 Views Left  Result Date: 05/08/2020 CLINICAL DATA:  Left hip pain after fall. EXAM: DG HIP (WITH OR WITHOUT PELVIS) 2-3V LEFT COMPARISON:  None. FINDINGS: Mild osteophyte formation is seen involving the left hip. Mildly displaced fracture is seen involving the left inferior pubic ramus. No definite evidence of proximal femur fracture is noted. IMPRESSION: Mildly displaced left inferior pubic ramus fracture. Mild osteoarthritis of the left hip. Electronically Signed   By: Marijo Conception M.D.   On: 05/08/2020 19:00    Microbiology: Recent  Results (from the past 240 hour(s))  Resp Panel by RT-PCR (Flu A&B, Covid) Nasopharyngeal Swab     Status: None   Collection Time: 05/08/20  5:40 PM   Specimen: Nasopharyngeal Swab; Nasopharyngeal(NP) swabs in vial transport medium  Result Value Ref Range Status   SARS Coronavirus 2 by RT PCR NEGATIVE NEGATIVE Final    Comment: (NOTE) SARS-CoV-2 target nucleic acids are NOT DETECTED.  The SARS-CoV-2 RNA is generally detectable in upper respiratory specimens during the acute phase of infection. The lowest concentration of SARS-CoV-2 viral copies this assay can detect is 138 copies/mL. A negative result does not preclude SARS-Cov-2 infection and should not be used as the sole basis for treatment or other patient management decisions. A negative result may occur with  improper specimen collection/handling, submission of specimen  other than nasopharyngeal swab, presence of viral mutation(s) within the areas targeted by this assay, and inadequate number of viral copies(<138 copies/mL). A negative result must be combined with clinical observations, patient history, and epidemiological information. The expected result is Negative.  Fact Sheet for Patients:  EntrepreneurPulse.com.au  Fact Sheet for Healthcare Providers:  IncredibleEmployment.be  This test is no t yet approved or cleared by the [place] FDA and  has been authorized for detection and/or diagnosis of SARS-CoV-2 by FDA under an Emergency Use Authorization (EUA). This EUA will remain  in effect (meaning this test can be used) for the duration of the COVID-19 declaration under Section 564(b)(1) of the Act, 21 U.S.C.section 360bbb-3(b)(1), unless the authorization is terminated  or revoked sooner.       Influenza A by PCR NEGATIVE NEGATIVE Final   Influenza B by PCR NEGATIVE NEGATIVE Final    Comment: (NOTE) The Xpert Xpress SARS-CoV-2/FLU/RSV plus assay is intended as an aid in the  diagnosis of influenza from Nasopharyngeal swab specimens and should not be used as a sole basis for treatment. Nasal washings and aspirates are unacceptable for Xpert Xpress SARS-CoV-2/FLU/RSV testing.  Fact Sheet for Patients: EntrepreneurPulse.com.au  Fact Sheet for Healthcare Providers: IncredibleEmployment.be  This test is not yet approved or cleared by the [place] FDA and has been authorized for detection and/or diagnosis of SARS-CoV-2 by FDA under an Emergency Use Authorization (EUA). This EUA will remain in effect (meaning this test can be used) for the duration of the COVID-19 declaration under Section 564(b)(1) of the Act, 21 U.S.C. section 360bbb-3(b)(1), unless the authorization is terminated or revoked.  Performed at [facility], [place] [address], [place], [place] [address]      Labs: Basic Metabolic Panel: Recent Labs  Lab 05/08/20 0922 05/08/20 1741 05/09/20 0307 05/10/20 0308  NA 139 141 137 136  K 4.3 3.8 4.0 3.9  CL 104 107 104 103  CO2 30 24 27 26   GLUCOSE 95 98 130* 112*  BUN 17 17 17 14   CREATININE 0.94 0.81 0.73 0.85  CALCIUM 9.5 8.9 8.9 8.9   Liver Function Tests: Recent Labs  Lab 05/08/20 0922 05/09/20 0307  AST 13 18  ALT 10 15  ALKPHOS  --  56  BILITOT 0.4 0.8  PROT 6.5 5.9*  ALBUMIN  --  3.4*   No results for input(s): LIPASE, AMYLASE in the last 168 hours. No results for input(s): AMMONIA in the last 168 hours. CBC: Recent Labs  Lab 05/08/20 0922 05/08/20 1741 05/09/20 0307 05/10/20 0308  WBC 3.6* 15.1* 7.3 5.8  NEUTROABS 2,599 13.8*  --  4.0  HGB 11.6* 11.1* 9.4* 8.7*  HCT 34.4* 33.2* 28.8* 26.2*  MCV 94.2 94.9 95.7 93.6  PLT 185 157 146* 131*   Cardiac Enzymes: No results for input(s): CKTOTAL, CKMB, CKMBINDEX, TROPONINI in the last 168 hours. BNP: BNP (last 3 results) No results for input(s): BNP in the last 8760 hours.  ProBNP (last 3 results) No  results for input(s): PROBNP in the last 8760 hours.  CBG: No results for input(s): GLUCAP in the last 168 hours.     Signed:  Alma Friendly, MD [facility] 05/10/2020, 12:10 PM

## 2020-05-11 DIAGNOSIS — D649 Anemia, unspecified: Secondary | ICD-10-CM | POA: Diagnosis not present

## 2020-05-11 DIAGNOSIS — S32592D Other specified fracture of left pubis, subsequent encounter for fracture with routine healing: Secondary | ICD-10-CM | POA: Diagnosis not present

## 2020-05-11 DIAGNOSIS — I10 Essential (primary) hypertension: Secondary | ICD-10-CM | POA: Diagnosis not present

## 2020-05-11 DIAGNOSIS — R262 Difficulty in walking, not elsewhere classified: Secondary | ICD-10-CM | POA: Diagnosis not present

## 2020-05-16 ENCOUNTER — Encounter (HOSPITAL_BASED_OUTPATIENT_CLINIC_OR_DEPARTMENT_OTHER): Payer: Medicare PPO | Admitting: Physician Assistant

## 2020-05-17 DIAGNOSIS — S32592D Other specified fracture of left pubis, subsequent encounter for fracture with routine healing: Secondary | ICD-10-CM | POA: Diagnosis not present

## 2020-05-17 DIAGNOSIS — I83019 Varicose veins of right lower extremity with ulcer of unspecified site: Secondary | ICD-10-CM | POA: Diagnosis not present

## 2020-05-17 DIAGNOSIS — R262 Difficulty in walking, not elsewhere classified: Secondary | ICD-10-CM | POA: Diagnosis not present

## 2020-05-17 DIAGNOSIS — K5900 Constipation, unspecified: Secondary | ICD-10-CM | POA: Diagnosis not present

## 2020-05-22 ENCOUNTER — Telehealth: Payer: Self-pay | Admitting: Internal Medicine

## 2020-05-22 ENCOUNTER — Encounter: Payer: Self-pay | Admitting: Internal Medicine

## 2020-05-22 DIAGNOSIS — S32412A Displaced fracture of anterior wall of left acetabulum, initial encounter for closed fracture: Secondary | ICD-10-CM | POA: Diagnosis not present

## 2020-05-22 NOTE — Telephone Encounter (Signed)
Faxed request to Dr Rod Can ([facility] office) request for office notes and any imanging results.

## 2020-05-22 NOTE — Telephone Encounter (Signed)
Khai Rowlette [phone]  Abishai called to say he had an Xray today and they are telling him, that he has to stay off of it for 6 weeks. He wants to go home, because he is of no help to [place] there.  He also said they are not going to give him any pain medicine there and he really needs your advice. I told him you have not received the Xray or any notes since he has been at [facility] and you would need notes, to advise him, because you would not want to go against his ortho doctors advice. He said please call when you have a minute even if it is consider an visit.

## 2020-05-22 NOTE — Telephone Encounter (Signed)
Spoke tonight with patient. He has been told by Ortho that he cannot bear weight on leg for several more weeks.His is somewhat dissatisfied with facility. His wife needs care and currently her daughter is staying there but will have to go back to [facility] to be with her husband who has health issues.Patient wants to try being home with home health services. He will be speaking with facility doctor tomorrow about possible discharge home. MJB,MD

## 2020-05-22 NOTE — Telephone Encounter (Signed)
See telephone note. If he goes home, we will need to order same PT company that his wife had as he liked them. He will let us know what he decides.

## 2020-05-23 NOTE — Telephone Encounter (Addendum)
Looked in Carlos White's chart and [facility] [employer].

## 2020-05-28 DIAGNOSIS — I83019 Varicose veins of right lower extremity with ulcer of unspecified site: Secondary | ICD-10-CM | POA: Diagnosis not present

## 2020-05-28 DIAGNOSIS — I83029 Varicose veins of left lower extremity with ulcer of unspecified site: Secondary | ICD-10-CM | POA: Diagnosis not present

## 2020-05-28 DIAGNOSIS — R262 Difficulty in walking, not elsewhere classified: Secondary | ICD-10-CM | POA: Diagnosis not present

## 2020-05-28 DIAGNOSIS — S32592D Other specified fracture of left pubis, subsequent encounter for fracture with routine healing: Secondary | ICD-10-CM | POA: Diagnosis not present

## 2020-05-29 DIAGNOSIS — D649 Anemia, unspecified: Secondary | ICD-10-CM | POA: Diagnosis not present

## 2020-05-29 DIAGNOSIS — S32492D Other specified fracture of left acetabulum, subsequent encounter for fracture with routine healing: Secondary | ICD-10-CM | POA: Diagnosis not present

## 2020-05-29 DIAGNOSIS — I700 Atherosclerosis of aorta: Secondary | ICD-10-CM | POA: Diagnosis not present

## 2020-05-29 DIAGNOSIS — S32592D Other specified fracture of left pubis, subsequent encounter for fracture with routine healing: Secondary | ICD-10-CM | POA: Diagnosis not present

## 2020-05-29 DIAGNOSIS — I872 Venous insufficiency (chronic) (peripheral): Secondary | ICD-10-CM | POA: Diagnosis not present

## 2020-05-29 DIAGNOSIS — S3289XD Fracture of other parts of pelvis, subsequent encounter for fracture with routine healing: Secondary | ICD-10-CM | POA: Diagnosis not present

## 2020-05-29 DIAGNOSIS — L97811 Non-pressure chronic ulcer of other part of right lower leg limited to breakdown of skin: Secondary | ICD-10-CM | POA: Diagnosis not present

## 2020-05-29 DIAGNOSIS — I10 Essential (primary) hypertension: Secondary | ICD-10-CM | POA: Diagnosis not present

## 2020-05-29 DIAGNOSIS — L97821 Non-pressure chronic ulcer of other part of left lower leg limited to breakdown of skin: Secondary | ICD-10-CM | POA: Diagnosis not present

## 2020-06-01 ENCOUNTER — Encounter: Payer: Self-pay | Admitting: Internal Medicine

## 2020-06-01 ENCOUNTER — Other Ambulatory Visit: Payer: Self-pay

## 2020-06-01 ENCOUNTER — Ambulatory Visit: Payer: Medicare PPO | Admitting: Internal Medicine

## 2020-06-01 VITALS — BP 140/60 | HR 86 | Temp 98.7°F | Ht 70.0 in | Wt 178.0 lb

## 2020-06-01 DIAGNOSIS — S32402D Unspecified fracture of left acetabulum, subsequent encounter for fracture with routine healing: Secondary | ICD-10-CM | POA: Diagnosis not present

## 2020-06-01 DIAGNOSIS — I10 Essential (primary) hypertension: Secondary | ICD-10-CM | POA: Diagnosis not present

## 2020-06-01 DIAGNOSIS — Z87438 Personal history of other diseases of male genital organs: Secondary | ICD-10-CM | POA: Diagnosis not present

## 2020-06-01 DIAGNOSIS — Q819 Epidermolysis bullosa, unspecified: Secondary | ICD-10-CM | POA: Diagnosis not present

## 2020-06-01 DIAGNOSIS — D649 Anemia, unspecified: Secondary | ICD-10-CM | POA: Diagnosis not present

## 2020-06-01 DIAGNOSIS — D519 Vitamin B12 deficiency anemia, unspecified: Secondary | ICD-10-CM | POA: Diagnosis not present

## 2020-06-01 DIAGNOSIS — S32592D Other specified fracture of left pubis, subsequent encounter for fracture with routine healing: Secondary | ICD-10-CM | POA: Diagnosis not present

## 2020-06-01 DIAGNOSIS — T148XXA Other injury of unspecified body region, initial encounter: Secondary | ICD-10-CM | POA: Diagnosis not present

## 2020-06-01 DIAGNOSIS — K5901 Slow transit constipation: Secondary | ICD-10-CM

## 2020-06-01 NOTE — Progress Notes (Signed)
Subjective:    Patient ID: Carlos White., male    DOB: [birth year], 80 y.o.   MRN: [id number]  HPI 80 year old Male recently discharged from [facility] rehab facility after sustaining a fractured left pelvis at the pubic rami and also found to have comminuted fractures involving left anterior pelvis and acetabulum due to a fall at his home on April 26.  He was discharged from [facility] on April 28 to rehab facility.  His wife is in poor health with history of COPD.  They live in a two-story townhome.  He realizes that he may need to move to a one level dwelling.  He has a history of hypertension treated with losartan 100 mg daily.  He has had some issues with constipation and is taking Dulcolax and MiraLAX.  This is likely due to inactivity.  He does take some tramadol for pain and has taken that previously for low back musculoskeletal pain for a number of years.  He has been responsible with that medication.  He takes Zyrtec as needed for allergy symptoms.  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  History of BPH treated with Flomax.  No known drug allergies.  History of adenomatous colon polyps and last colonoscopy done by Dr. Cristina Gong in 2016 with tubular adenoma removed.  He had wanted to defer repeat study as his wife has been ill.  Social history he is married.  He has 4 children from his first marriage 2 daughters and 2 sons.  Wife is retired but formally did family therapy education at [employer].  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  Prior to his fall he like to play golf.  He does not smoke.  He has been in recovery from alcoholism for many years and is a well-known speaker for Alcoholics Anonymous.  Family history: 1 sister in good health.   Father died of congestive heart failure with history of prostate cancer.  Mother died with history of hypertension and dementia.  Maternal uncle with history of coronary disease died of an MI.  Paternal aunt with history of dementia and coronary disease.  He has a history of chronic iron deficiency anemia I think due to leg ulcers/epidermolysis bullosa.  We have 2 COVID vaccines on file and it seems that he needs a booster.  Tetanus immunization is up-to-date.  Review of Systems seen today sitting in wheelchair.  Physical therapy has been out to his home.  Talked about moving forward in the future once he recovers from this fall with a one-story dwelling.  Currently he and his wife have a lift chair and they can ride up and down the stairs.  There is no bedroom downstairs.     Objective:   Physical Exam Vital signs reviewed: Blood pressure 140/60 pulse 86 temperature 98.7 pulse oximetry 97% weight 178 pounds BMI 25.54.  He has lost about 16 pounds since April 2021.  He says part of that was intentional because he thought he was overweight.  His chest is clear to auscultation.  Cardiac exam: regular rate and rhythm normal S1 and S2.  Affect thought and judgment are normal.  His legs are wrapped and they were not unwrapped today.     Assessment & Plan:  Fracture pelvis  and left acetabulum-currently at home undergoing rehab through home physical therapy.  He has follow-up with orthopedist soon.  He has home health nursing ordered.  His wife has COPD and is in poor health.  They are considering moving to a 1 level dwelling sometime in the future.  Currently wife's daughter from a previous marriage is staying with them but she cannot study much longer as her husband has some medical issues that need to be attended to.  They reside in [place].  Nonfasting labs drawn and pending today.  Medications reviewed.  He seems to be doing well.  He will return in about a month for follow-up here.

## 2020-06-01 NOTE — Patient Instructions (Addendum)
Constipation has been addressed.  He has medication for this.  Continue to take tramadol sparingly for pain.  Labs drawn and pending.  Continue home PT and follow-up with orthopedist regarding fractures.  He will follow-up here in about a month.  Continue to take tramadol sparingly for pain.  For constipation take Dulcolax or MiraLAX

## 2020-06-02 DIAGNOSIS — L97811 Non-pressure chronic ulcer of other part of right lower leg limited to breakdown of skin: Secondary | ICD-10-CM | POA: Diagnosis not present

## 2020-06-02 DIAGNOSIS — I872 Venous insufficiency (chronic) (peripheral): Secondary | ICD-10-CM | POA: Diagnosis not present

## 2020-06-02 DIAGNOSIS — S3289XD Fracture of other parts of pelvis, subsequent encounter for fracture with routine healing: Secondary | ICD-10-CM | POA: Diagnosis not present

## 2020-06-02 DIAGNOSIS — L97821 Non-pressure chronic ulcer of other part of left lower leg limited to breakdown of skin: Secondary | ICD-10-CM | POA: Diagnosis not present

## 2020-06-02 DIAGNOSIS — S32492D Other specified fracture of left acetabulum, subsequent encounter for fracture with routine healing: Secondary | ICD-10-CM | POA: Diagnosis not present

## 2020-06-02 DIAGNOSIS — S32592D Other specified fracture of left pubis, subsequent encounter for fracture with routine healing: Secondary | ICD-10-CM | POA: Diagnosis not present

## 2020-06-02 DIAGNOSIS — D649 Anemia, unspecified: Secondary | ICD-10-CM | POA: Diagnosis not present

## 2020-06-02 DIAGNOSIS — I700 Atherosclerosis of aorta: Secondary | ICD-10-CM | POA: Diagnosis not present

## 2020-06-02 DIAGNOSIS — I10 Essential (primary) hypertension: Secondary | ICD-10-CM | POA: Diagnosis not present

## 2020-06-04 ENCOUNTER — Other Ambulatory Visit: Payer: Self-pay

## 2020-06-04 DIAGNOSIS — I872 Venous insufficiency (chronic) (peripheral): Secondary | ICD-10-CM | POA: Diagnosis not present

## 2020-06-04 DIAGNOSIS — S32492D Other specified fracture of left acetabulum, subsequent encounter for fracture with routine healing: Secondary | ICD-10-CM | POA: Diagnosis not present

## 2020-06-04 DIAGNOSIS — S3289XD Fracture of other parts of pelvis, subsequent encounter for fracture with routine healing: Secondary | ICD-10-CM | POA: Diagnosis not present

## 2020-06-04 DIAGNOSIS — S32592D Other specified fracture of left pubis, subsequent encounter for fracture with routine healing: Secondary | ICD-10-CM | POA: Diagnosis not present

## 2020-06-04 DIAGNOSIS — I700 Atherosclerosis of aorta: Secondary | ICD-10-CM | POA: Diagnosis not present

## 2020-06-04 DIAGNOSIS — D649 Anemia, unspecified: Secondary | ICD-10-CM | POA: Diagnosis not present

## 2020-06-04 DIAGNOSIS — L97811 Non-pressure chronic ulcer of other part of right lower leg limited to breakdown of skin: Secondary | ICD-10-CM | POA: Diagnosis not present

## 2020-06-04 DIAGNOSIS — I10 Essential (primary) hypertension: Secondary | ICD-10-CM | POA: Diagnosis not present

## 2020-06-04 DIAGNOSIS — L97821 Non-pressure chronic ulcer of other part of left lower leg limited to breakdown of skin: Secondary | ICD-10-CM | POA: Diagnosis not present

## 2020-06-05 LAB — TEST AUTHORIZATION

## 2020-06-05 LAB — BASIC METABOLIC PANEL
BUN: 11 mg/dL (ref 7–25)
CO2: 26 mmol/L (ref 20–32)
Calcium: 9.3 mg/dL (ref 8.6–10.3)
Chloride: 102 mmol/L (ref 98–110)
Creat: 0.91 mg/dL (ref 0.70–1.11)
Glucose, Bld: 102 mg/dL — ABNORMAL HIGH (ref 65–99)
Potassium: 4.2 mmol/L (ref 3.5–5.3)
Sodium: 138 mmol/L (ref 135–146)

## 2020-06-05 LAB — CBC WITH DIFFERENTIAL/PLATELET
Absolute Monocytes: 383 cells/uL (ref 200–950)
Basophils Absolute: 41 cells/uL (ref 0–200)
Basophils Relative: 0.8 %
Eosinophils Absolute: 31 cells/uL (ref 15–500)
Eosinophils Relative: 0.6 %
HCT: 28.8 % — ABNORMAL LOW (ref 38.5–50.0)
Hemoglobin: 9.4 g/dL — ABNORMAL LOW (ref 13.2–17.1)
Lymphs Abs: 632 cells/uL — ABNORMAL LOW (ref 850–3900)
MCH: 30.6 pg (ref 27.0–33.0)
MCHC: 32.6 g/dL (ref 32.0–36.0)
MCV: 93.8 fL (ref 80.0–100.0)
MPV: 8.9 fL (ref 7.5–12.5)
Monocytes Relative: 7.5 %
Neutro Abs: 4014 cells/uL (ref 1500–7800)
Neutrophils Relative %: 78.7 %
Platelets: 320 10*3/uL (ref 140–400)
RBC: 3.07 10*6/uL — ABNORMAL LOW (ref 4.20–5.80)
RDW: 12.5 % (ref 11.0–15.0)
Total Lymphocyte: 12.4 %
WBC: 5.1 10*3/uL (ref 3.8–10.8)

## 2020-06-05 LAB — IRON,TIBC AND FERRITIN PANEL
%SAT: 18 % (calc) — ABNORMAL LOW (ref 20–48)
Ferritin: 103 ng/mL (ref 24–380)
Iron: 46 ug/dL — ABNORMAL LOW (ref 50–180)
TIBC: 257 mcg/dL (calc) (ref 250–425)

## 2020-06-05 LAB — VITAMIN B12: Vitamin B-12: 400 pg/mL (ref 200–1100)

## 2020-06-05 LAB — FOLATE: Folate: 16.4 ng/mL

## 2020-06-12 ENCOUNTER — Telehealth: Payer: Self-pay | Admitting: Internal Medicine

## 2020-06-12 NOTE — Telephone Encounter (Signed)
Faxed Urgent signed orders  To [phone]-, phone [phone]  OT to [facility] also to have wound clinic appt follow up as well

## 2020-06-13 ENCOUNTER — Encounter (HOSPITAL_BASED_OUTPATIENT_CLINIC_OR_DEPARTMENT_OTHER): Payer: Medicare PPO | Attending: Physician Assistant | Admitting: Physician Assistant

## 2020-06-13 ENCOUNTER — Other Ambulatory Visit: Payer: Self-pay

## 2020-06-13 DIAGNOSIS — L97822 Non-pressure chronic ulcer of other part of left lower leg with fat layer exposed: Secondary | ICD-10-CM | POA: Diagnosis not present

## 2020-06-13 DIAGNOSIS — Z87891 Personal history of nicotine dependence: Secondary | ICD-10-CM | POA: Diagnosis not present

## 2020-06-13 DIAGNOSIS — Q818 Other epidermolysis bullosa: Secondary | ICD-10-CM | POA: Diagnosis not present

## 2020-06-13 DIAGNOSIS — I10 Essential (primary) hypertension: Secondary | ICD-10-CM | POA: Diagnosis not present

## 2020-06-13 DIAGNOSIS — L97812 Non-pressure chronic ulcer of other part of right lower leg with fat layer exposed: Secondary | ICD-10-CM | POA: Insufficient documentation

## 2020-06-13 DIAGNOSIS — I872 Venous insufficiency (chronic) (peripheral): Secondary | ICD-10-CM | POA: Diagnosis not present

## 2020-06-13 DIAGNOSIS — L97322 Non-pressure chronic ulcer of left ankle with fat layer exposed: Secondary | ICD-10-CM | POA: Insufficient documentation

## 2020-06-13 NOTE — Progress Notes (Signed)
GREGGORY, SAFRANEK ([id number]) Visit Report for 06/13/2020 Chief Complaint Document Details Patient Name: Date of Service: Carlos White 06/13/2020 12:30 PM Medical Record Number: [id number] Patient Account Number: [id number] Date of Birth/Sex: Treating RN: 1940/11/06 (80 y.o. [name] Primary Care Provider: [name] Other Clinician: Referring Provider: Treating Provider/Extender: [name] in Treatment: [phone] Information Obtained from: Patient Chief Complaint Bilateral LE Ulcers Electronic Signature(s) Signed: 06/13/2020 12:52:58 PM By: Worthy Keeler PA-C Entered By: Worthy Keeler on 06/13/2020 12:52:58 -------------------------------------------------------------------------------- HPI Details Patient Name: Date of Service: Carlos White, [place]. 06/13/2020 12:30 PM Medical Record Number: [id number] Patient Account Number: [id number] Date of Birth/Sex: Treating RN: 05/04/1940 (80 y.o. [name] Primary Care Provider: [name] Other Clinician: Referring Provider: Treating Provider/Extender: [name] in Treatment: 141 History of Present Illness HPI Description: 09/30/17 on evaluation today patient presents for initial evaluation and our clinic concerning issues that he has been having with his bilateral lower extremities. He states this has been going on for quite some time at least six months. Currently his regiment has been mainly cleaning the area with peroxide, applying the is foreign ointment, and wrapping the area with ABD pads and then an ace wrap loosely. He has dealt with issues of this nature he tells me for quite some time. He does have a history of having had a compound fracture of the left lower extremity which he thinks also makes this a much more difficult area for him to heal. He's previously been told that he had poor vascular flow but this was years ago at [facility] we do not have any of those  records at this time. He has a history of Epidermolysis Bullosa which was diagnosed around age 38 and he has been cared for at [facility] since that time. Subsequently he states this is hereditary and two of his children one male and one male also have this as well is one of his grandchildren that he is aware of. He has no evidence of infection necessarily at this point although he does have some necrotic tissue noted on the surface of the wound as far as the largest, left lateral lower extremity ulcer, is concerned. Overall I feel like all things considered he's been taking care of this very well. Obviously he has some fairly significant issues going on at this point in this regard. He does have a history otherwise of hypertension though for the most part other than the compound fracture of the left leg he seems to have been fairly healthy in my pinion. 10/07/17 on evaluation today patient actually appears to be doing better in regard to his bilateral lower extremity ulcers. With that being said he does still have some evidence of slough noted on the surface of the wounds I think the Iodoflex has been beneficial for him. His arterial studies are scheduled for October 2. With that being said I do believe that he is continuing to show signs of good improvement which is at least good news. 10/14/17 on evaluation today patient appears to be doing very well in regard to his lower extremity ulcers. He's definitely made some progress as far as healing is concerned although there still are several open areas that are going to need to be addressed. He did have his arterial study today which fortunately shows good findings with a right ABI of 1.23 with a TBI of 0.86 in the left ABI  of 1.28 with a TBI of 0.81. This is good news and will allow [place] to perform debridement as well. 10/23/2017; patient with a large wound on the left lateral calf, sizable area on the left medial malleolus and an area on the right lateral  malleolus. He has a new blister consistent with his underlying blistering skin disease just above this area we have been using Iodoflex on the lateral left calf lateral right ankle and collagen on the medial left ankle. We have been using Kerlix Coban wraps 10/28/17 on evaluation today the patient continues to have signs of improvement in regard to the overall appearance of the original wound. Unfortunately he did have some blistering over the right lateral lower extremity which has appeared to rupture on evaluation today and likely some of the dead tissue on the surface needs to be cleaned away the good news is this does not appear to be to significantly deep at this time. 11/04/17 on evaluation today patient actually appears to be doing a little better in regard to his lower extremity ulcers. He has been tolerating the dressing changes without complication. With that being said he does still have a significant wound especially over the left lateral lower extremity unfortunately. All of the wounds pretty much are going to require sharp debridement today. 11/11/17 on evaluation today patient appears to be doing more poorly in regard to his left lower extremity in particular. There does not appear to be any evidence of systemic infection although the wound itself as far as the larger left lateral lower extremity ulcer actually appears to be infected in my pinion. There's an older and the surface of the wound is dramatically worsened compared to last week. No fevers, chills, nausea, or vomiting noted at this time. 11/18/17 upon evaluation today patient actually appears to be doing better. I did review his culture today which really did not show any specific organism is a positive reason for his wound decline. There are multiple organisms present not predominant. Nonetheless he seems to be tolerate the doxycycline well in his wounds in general do seem to be doing better. Fortunately there does not appear  to be any evidence of infection at this time which is good news. Overall I'm very pleased with how things appear. Nonetheless he still has a lot of healing to Carlos. I do think he could benefit from a Juxta-Lite wrap. 11/25/17 on evaluation today patient actually appears to be doing fairly well in regard to his wounds. He is still taking the antibiotics he has a few days left. Fortunately this seems to have been excellent for him as far as getting the infection control and very happy in this regard. With that being said the patient likewise is also very pleased with how things appear at this time in comparison to where we were he's not having as much pain. 12/02/17 Seen today for follow p and management of LLE wounds. Wounds appear to show some improvement. He denies pain, fever, or chills. Completed a course of doxycycline earlier this month. Scheduled to received Juxta-Lite wrap this week. No s/s of infections. 12/09/17 on evaluation today patient actually appears to be doing a little bit better in regard to his wounds. This is obscene very slow process and unfortunately he has a couple of new areas and this is due to the Epidermolysis Bullosa. Nonetheless I am concerned about the fact that he seems to be getting more areas not less that is the reason we're gonna work on  getting schedule for the vascular referral to see the venous specialist. 12/23/17 upon evaluation today patient's wounds currently shows evidence of still not doing quite as well is what I would like to have seen. Subsequently the patient did have his venous study which showed evidence of venous stasis. Subsequently I do think that a vascular evaluation for consideration of venous intervention would be appropriate. I'm not necessarily suggesting that will be anything that can be done but I think it is at least a good idea. He is in agreement with this plan. 12/30/17 on evaluation today patient actually appears to be doing very well in  regard to his wounds when compared to previous evaluation. Subsequently we have been using the [employer] Dressing which actually appears to have done excellent on his left lateral lower extremity ulcer. The quality of the wound surface is dramatically improved. There is some slight debridement that is going to be required at a couple of locations but overall I'm extremely pleased with how things appear here. 01/07/2018; this is a patient with a primary skin disorder epidermolyis bullosa. Is a large wound on the left lateral calf and smaller wounds on the right however there is a new wound on the right mid tibia area that occurred within the compression wrap that he did not change. We have been using Hydrofera Blue. On the left he is using Hydrofera Blue and Santyl to the inferior part of the wound and changing the dressing himself. 01/15/2018; primary skin disorder epidermolysis bullosa. He has several difficult wounds including the left lateral calf, smaller wounds on the left medial calf and the right lateral calf. The major area on the left lateral calf has a smaller area inferiorly that has necrotic debris we have been using Santyl to this. The rest of the wounds we have been using Hydrofera Blue. The area on the left calf actually looks larger this week. Uncontrolled edema several small open areas above it that are superficial 01/20/18 on evaluation today patient appears to be doing better as compared to last week in regard to his wounds of the bilateral lower extremities. He tolerated the bilateral compression wrap without complication. Overall I'm very pleased with how things appear at this time. The patient likewise is very happy. 01/27/18 on evaluation today patient appears to be doing decently well in regard to his bilateral lower extremity ulcers. He's been tolerating the dressing changes without complication. One issue he had is that he did have more drainage to the left leg wrapped last  week. He states in fact he probably should come in and let us change it on Friday however he just left it in place and kept adding extra absorption with ABD pads to the external portion of the wrap. Unfortunately he does have some aspiration type breakdown nothing significant but I do believe that this was probably counterproductive in general. Nonetheless his wounds do not appear to be terrible overall. 02/03/18 on evaluation today patient appears to be doing rather well in regard to his lower extremity ulcers. He has been tolerating the dressing changes without complication. He does tell me that he had to change the wrap on the left one since we last saw him. Subsequently I do not see any evidence of infection I do feel like the food was much better controlled at this point. 02/10/18 on evaluation today patient appears to be doing rather well in regard to his ulcers. He still has significant alterations especially on the left lateral lower extremity.  Fortunately there's no signs of infection at this time. Overall I feel like he is making good progress are some areas that I'm gonna attempt some debridement today. 02/17/18 on evaluation today patient appears to be doing okay in regard to his lower Trinity ulcer. It does appear on both locations he has a little bit of drainage causing some breakdown in maceration around the wound bed's although it doesn't appear to be too bad the right is a little bit worse than left. Fortunately there's no signs of infection which is good news. No fevers, chills, nausea, or vomiting noted at this time. 03/10/18 on evaluation today patient actually appears to be doing rather poorly in regard to his bilateral lower extremity ulcers. The right in particular is draining profusely and the wound is actually enlarging which is not good. I'm concerned about both possibly infection and the fact that there's a lot of moisture which is causing breakdown as well. Unfortunately the  patient has been trying to change this at home I'm afraid he may need to change more frequently in order to see the improvement that were looking for. There's no signs of systemic infection. 03/17/18 patient actually appears to be doing significantly better at this point in regard to his bilateral lower extremity ulcers. Fortunately there's no signs of infection. That is worsening infection at least indefinitely nothing systemic. With that being said he is having a lot of drainage though not quite as much is there in his last evaluation. Overall feel like he's on the side of improvement. I think if his results back from his culture which showed that he had a positive group B strep along with abundant Pseudomonas noted on the culture. For that reason I am gonna have him continue with the linezolid as we previously have ordered for him and I did Carlos ahead as well today and prescribe Levaquin as well in order to treat the Pseudomonas portion of the infection noted. 03/24/18 on evaluation today patient actually appears to be doing very well in regard to his lower Trinity ulcer. He's been tolerating the dressing changes without complication. Fortunately both legs show signs of less drainage in his edema is very well controlled at this point as well. Overall very pleased with how things seem to be progressing. 03/31/18 on evaluation today patient actually appears to be doing excellent in regard to his bilateral lower extremity ulcers. These are not draining nearly as significant as what they were in the past overall seem to be shown signs of excellent improvement which is great news. Fortunately there is no sign of active infection at this time I do believe that the Levaquin has done extremely well for him in this regard. The patient continues to change these at home typically every day. We may be able to slowly work towards every other day changes since the drainage seems to be slowing down quite  significantly. 04/14/18 on evaluation today patient appears to be doing well in regard to his bilateral lower extremities. Let me Hilda Blades Almost completely healed which is excellent news. Fortunately he's shown signs of improvement all other sites as well with new skin growth there's some slight hyper granular tissue but for the most part this seems to be well maintained with the [facility] Dressing. I'm very happy in this regard. 04/28/18 on evaluation today patient appears to be doing rather well in regard to his ulcers of the bilateral lower extremities all things considering. He continues to make some progress as far  as new skin growth. There still some hyper granulation noted at this point despite the use of the [facility] Dressing. This is not terrible but I think we may want to consider conclude cauterization today with silver nitrate to try to help knock some of his back as well as helping with any biofilm on the surface of the wound. 05/12/18 on evaluation today patient's wounds actually appear to be doing fairly well in regard to the bilateral lower extremities. He's been tolerating the dressing changes without complication. Fortunately there's no signs of active infection at this time which is good news. Overall very pleased with how things seem to be progressing. You select silver nitrate was beneficial for him. 05/26/18 on evaluation today patient appears to be doing better in regard to left lower extremity and a little bit worse in regard to the right lower extremity. He states that he was pulling off the [facility] Dressing peel back some of the skin making this area significantly larger than what it was previous. He's not had any issues other than this and states even that hasn't caused any pain he just seems to obviously have a much larger area on the right when compared to the previous time I saw him. No fevers, chills, nausea, or vomiting noted at this time. 06/16/18 on  evaluation today patient actually appears to be doing a little better in my pinion in regard to his lower summary ulcers. He has new skin islands that seem to be spreading which is good news. Fortunately there's no signs of active infection at this time. His biggest issue is he tells me that coming as often as he does is becoming very cost prohibitive. He wonders if we can potentially spread this out. 07/14/18 on evaluation today patient appears to be doing a little bit worse in regard to his lower from the ulcer. Unfortunately he still continues to have a significant amount of drainage I think we need to do something to try to help this more. He is still somewhat reluctant to Carlos the route of the Wound VAC although that may be the most appropriate thing for him. No fevers, chills, nausea, or vomiting noted at this time. 08/18/2018 on evaluation today patient actually appears to be doing quite well with regard to his bilateral lower extremity ulcers. I do feel like that currently he is making great progress the care max does seem to be doing a great job at helping to control the moisture he has no maceration or skin breakdown. Again this seems to be an excellent way to Carlos. 1 thing we may want to change is adding collagen to the base of the wound and then the care max over top he is not opposed to this. 09/15/2018 on evaluation today patient appears to be doing well with regard to his bilateral lower extremity ulcers. He is showing some signs of improvement not necessarily in size but definitely in appearance. In fact he has a lot of new skin growing throughout the wounds along the edges as well as in the central portion of the wounds on both lower extremities. Overall I am extremely pleased to see this. 10/20/2018 on evaluation today patient actually appears to be doing quite well with regard to his wounds. They are not measuring significantly smaller but he does have a lot of new epithelization noted as  compared to previous. Fortunately there is no signs of active infection at this time. No fevers, chills, nausea, vomiting, or diarrhea. 11/17/2018  on evaluation today patient presents for reevaluation concerning his bilateral lower extremity ulcers. Fortunately there is no signs of active infection at this time today. He has been tolerating the dressing changes without complication. No fevers, chills, nausea, vomiting, or diarrhea. Unfortunately in general the patient has not made as much improvement as I would like to have seen up to this point. He has been tolerating the dressing changes without complication and he does an excellent job taking care of his wounds at home in my opinion. The biggest issue I see is that he is just not making the progress that we need to be seeing currently. I think we may want to consider having him seen at a plastic surgery appointment and he has previously seen someone in years past at [facility] in [place]. That is definitely a possibility for [place] to look into at this point. 12/29/2018 on evaluation today patient appears to be doing better in regard to the overall visual appearance of his wounds which do not appear to be as macerated. He does have a much larger skin island in the middle of the left lower extremity ulcer which is doing much better. He tells me the pain is also significantly better. With that being said overall his improvement as far as the size of the wounds is not better but again these are very irregular in change shape quite often. Fortunately there is no evidence of active infection at this time which is great news. He never heard anything from [facility] regarding the plastic surgery referral that we made to them. 01/26/2019 upon evaluation today patient appears to be doing a little better in regard to his wounds today. He has been tolerating the dressing changes again he performs these for the most part on his own. He does a great  job wrapping his legs in my opinion. Unfortunately he has not been able to get down to [facility] to see if there is anything from a plastic surgery standpoint that could be done to help with his legs simply due to the fact that his wife unfortunately sustained a compression fracture in her spine she is seeing Dr. Saintclair Halsted and subsequently is going to be having what sounds to be a kyphoplasty type procedure. With that being said that has not been scheduled yet there is still waiting on an MRI the patient is very busy in fact overly busy trying to help take care of his wife at this point. I completely understand this is more of a strain on him at this time 02/23/2019 upon evaluation today patient actually appears to be making some progress. I am actually very pleased with the overall appearance of his wounds even compared to last evaluation. He seems to be doing quite well. He is taking care of his wife unfortunately she did have a compression fracture she has had the procedure for this but still she has a slow road to recovery. For that reason he still not gone to [facility] for a second opinion in this regard. Obviously the goal there was if there was anything that can be done from a skin graft standpoint or otherwise. 03/23/2019 upon evaluation today patient continues to have issues with lower extremity ulcers. Since the beginning he has made progress but at the same time the wounds unfortunately just will not close. We have been trying to get the patient to [facility] to see a specialist there but unfortunately with the everything going on with his  wife he has not been able to make that appointment time yet he states he may be able to in the next 1-2 months but is not really sure. 04/27/2019 on evaluation today patient appears to be doing a little bit more poorly. His last evaluation. He appears to have some erythema around the edges of the wound at this point. Fortunately there is no  signs of active infection at this time which is good news. No fevers, chills, nausea, vomiting, or diarrhea. 06/08/2019 on evaluation today patient appears to be doing well with regard to his wounds. Overall they are actually measuring smaller compared to the last visit last month. We did treat him for Pseudomonas as well as methicillin-resistant Staph aureus. He was only on the treatment for MRSA however for 7 days as the Cipro was resistant and subsequently we had to place him on doxycycline. Nonetheless I am thinking that we may want to add the doxycycline and just do a month-long treatment considering the longstanding nature of his wounds and see if we get this under better control. 07/13/2019 upon evaluation today patient appears to be doing fairly well in regard to his bilateral lower extremities. There does not appear to be any signs of active infection which is good news. No fevers, chills, nausea, vomiting, or diarrhea. 08/10/2019 upon evaluation today patient appears to be doing about the same with regard to his wounds in general. Unfortunately he is not significantly better although is also not significantly worse which is great news there is no evidence of active infection at this time which is good news. He still dealing with a lot going on with his wife and therefore is not really able to Carlos see anyone at the specialty clinic at [facility] that we have previously set up still. 09/07/2019 on evaluation today patient appears to be doing well with regard to his wounds. In fact this is probably the best that have seen so far in quite a few months. Overall I am very pleased with where things stand at this time. No fevers, chills, nausea, vomiting, or diarrhea. 10/05/2019 upon evaluation today patient appears to be doing more poorly in regard to his legs at this point. He actually is showing some signs of infection. This has been something that we seem to be back-and-forth with as far as trying to keep  these wounds from becoming infected. He takes care of them very well in my opinion but nonetheless I am concerned in this regard. He tells me that his wife is still doing really about the same she is slowly getting better. Nonetheless he still spends the majority of his time helping to take care of her. 11/02/2019 upon evaluation today patient actually appears to be doing somewhat better in regard to his legs. I do believe that the compounded antibiotic treatment from [facility] has been beneficial for him. Overall I am extremely pleased with where things stand today. There is no signs of active infection at this time. The patient states he has much less drainage than he has in the past. 12/07/2019 upon evaluation today patient appears to be doing well at this time in regard to the overall appearance of his wound bed. Currently there is no signs of active infection at this time. 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 Especially when  he stressed. Fortunately there is no signs of active infection locally or systemically at this point. 01/18/2020 on evaluation today patient appears to be doing well with regard to his leg ulcers. He has been tolerating the dressing changes without complication. Fortunately there is no signs of infection and overall I feel like his legs are doing about the best they have done in quite some time. There does not appear to be any evidence of active infection which is great news and overall very pleased. 02/29/2020 upon evaluation today patient's wounds actually appear to be doing quite well currently. There is no sign of active infection at this time. No fevers, chills, nausea, vomiting, or diarrhea. 04/11/2025 on evaluation today patient appears to be doing excellent in regard to his wounds on the legs to be  honest. He has made a lot of progress there does not appear to be any signs of active infection and overall I think that he is better than previous although still the wounds are quite significant obviously. In general I think that he is pleased with where things stand at this point. Still there is quite a bit of work to do here. 07/13/2020 upon evaluation today patient appears to be doing well with regard to his wound. Is been tolerating the dressing changes without complication on both legs. He just has the 2 main wounds on each leg at the ankle on the medial region of the right leg is completely healed. His wounds do seem to have gotten better during the time that he has been on bedrest as result of the pelvis fracture. Obviously does not the way that we wanted to see things improved but nonetheless he tells me that it is what it is. Fortunately he does seem to be doing better he tells me that her daughter from the [place] is actually down helping to take care of them out as he and his wife while he is recovering. Electronic Signature(s) Signed: 06/13/2020 1:07:43 PM By: Worthy Keeler PA-C Entered By: Worthy Keeler on 06/13/2020 13:07:43 -------------------------------------------------------------------------------- Physical Exam Details Patient Name: Date of Service: Carlos White 06/13/2020 12:30 PM Medical Record Number: [id number] Patient Account Number: [id number] Date of Birth/Sex: Treating RN: [birth year] (80 y.o. [name] Primary Care Provider: [name] Other Clinician: Referring Provider: Treating Provider/Extender: [name] in Treatment: 44 Constitutional Well-nourished and well-hydrated in no acute distress. Respiratory normal breathing without difficulty. Psychiatric this patient is able to make decisions and demonstrates good insight into disease process. Alert and Oriented x 3. pleasant and cooperative. Notes Upon inspection  patient's wound bed actually showed signs of good granulation epithelization at this point. There does not appear to be any signs of infection which is great news and overall very pleased with where things stand today. I actually think he is doing quite well he has lost quite a bit of weight and his legs are very skinny compared to previous. Overall I think he is making good progress here. Electronic Signature(s) Signed: 06/13/2020 1:08:03 PM By: Worthy Keeler PA-C Entered By: Worthy Keeler on 06/13/2020 13:08:03 -------------------------------------------------------------------------------- Physician Orders Details Patient Name: Date of Service: Carlos White. 06/13/2020 12:30 PM Medical Record Number: [id number] Patient Account Number: [id number] Date of Birth/Sex: Treating RN: Jan 12, 1941 (80 y.o. 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 - on days that dressing is changed Edema Control - Lymphedema / SCD / Other Bilateral Lower Extremities Elevate legs to the level of the heart or above for 30 minutes daily and/or when sitting, a frequency of: - throughout the day Avoid standing for long periods of time. Exercise regularly Moisturize legs daily. Compression stocking or Garment  20-30 mm/Hg pressure to: - Juxtalite to both legs daily Home Health No change in wound care orders this week; continue Home Health for wound care. May utilize formulary equivalent dressing for wound treatment orders unless otherwise specified. Dressing changes to be completed by [facility] on Monday / Wednesday / Friday except when patient has scheduled visit at [facility]. Other Home Health Orders/Instructions: - [facility] Wound Treatment Wound #18 - Lower Leg Wound Laterality: Right, Lateral, Proximal Cleanser: Soap and Water Every Other Day/30 Days Discharge Instructions: May shower and wash wound with dial antibacterial soap and water prior to dressing change. Peri-Wound Care: Zinc Oxide Ointment 30g tube Every Other Day/30 Days Discharge Instructions: Apply Zinc Oxide to periwound with each dressing change as needed for maceration Peri-Wound Care: Sween Lotion (Moisturizing lotion) Every Other Day/30 Days Discharge Instructions: Apply moisturizing lotion as directed Prim Dressing: Fibracol Plus Dressing, 4x4.38 in (collagen) Every Other Day/30 Days ary Discharge Instructions: Moisten collagen with saline or hydrogel Secondary Dressing: ABD Pad, 5x9 (DME) (Generic) Every Other Day/30 Days Discharge Instructions: Apply over primary dressing as directed. Secondary Dressing: Zetuvit Plus 4x8 in Every Other Day/30 Days Discharge Instructions: Apply over primary dressing as directed. Secondary Dressing: Xtrasorb Classic Super Absorbent Dressing, 6x9 (in/in) (DME) (Dispense As Written) Every Other Day/30 Days Discharge Instructions: Apply over primary dressing as directed. Secured With: Elastic Bandage 4 inch (ACE bandage) Every Other Day/30 Days Discharge Instructions: Secure with ACE bandage as directed. Secured With: [employer], 4.5x3.1 (in/yd) (DME) (Generic) Every Other Day/30 Days Discharge Instructions: Secure with Kerlix as directed. Wound #2 - Lower Leg Wound  Laterality: Left, Lateral, Distal Cleanser: Soap and Water Every Other Day/30 Days Discharge Instructions: May shower and wash wound with dial antibacterial soap and water prior to dressing change. Peri-Wound Care: Zinc Oxide Ointment 30g tube Every Other Day/30 Days Discharge Instructions: Apply Zinc Oxide to periwound with each dressing change as needed for maceration Peri-Wound Care: Sween Lotion (Moisturizing lotion) Every Other Day/30 Days Discharge Instructions: Apply moisturizing lotion as directed Prim Dressing: Fibracol Plus Dressing, 4x4.38 in (collagen) Every Other Day/30 Days ary Discharge Instructions: Moisten collagen with saline or hydrogel Secondary Dressing: ABD Pad, 5x9 (DME) (Generic) Every Other Day/30 Days Discharge Instructions: Apply over primary dressing as directed. Secondary Dressing: Zetuvit Plus 4x8 in Every Other Day/30 Days Discharge Instructions: Apply over primary dressing as directed. Secondary Dressing: Xtrasorb Classic Super Absorbent Dressing, 6x9 (in/in) (DME) (Dispense As Written) Every Other Day/30 Days Discharge Instructions: Apply over primary dressing as directed. Secured With: Elastic Bandage 4 inch (ACE bandage) Every Other Day/30 Days Discharge Instructions: Secure with ACE bandage as directed. Secured With: [employer], 4.5x3.1 (in/yd) (  Generic) Every Other Day/30 Days Discharge Instructions: Secure with Kerlix as directed. Wound #5 - Lower Leg Wound Laterality: Right, Lateral Cleanser: Soap and Water Every Other Day/30 Days Discharge Instructions: May shower and wash wound with dial antibacterial soap and water prior to dressing change. Peri-Wound Care: Zinc Oxide Ointment 30g tube Every Other Day/30 Days Discharge Instructions: Apply Zinc Oxide to periwound with each dressing change as needed for maceration Peri-Wound Care: Sween Lotion (Moisturizing lotion) Every Other Day/30 Days Discharge Instructions: Apply moisturizing lotion as  directed Prim Dressing: Fibracol Plus Dressing, 4x4.38 in (collagen) Every Other Day/30 Days ary Discharge Instructions: Moisten collagen with saline or hydrogel Secondary Dressing: ABD Pad, 5x9 (DME) (Generic) Every Other Day/30 Days Discharge Instructions: Apply over primary dressing as directed. Secondary Dressing: Zetuvit Plus 4x8 in Every Other Day/30 Days Discharge Instructions: Apply over primary dressing as directed. Secondary Dressing: Xtrasorb Classic Super Absorbent Dressing, 6x9 (in/in) (DME) (Dispense As Written) Every Other Day/30 Days Discharge Instructions: Apply over primary dressing as directed. Secured With: Elastic Bandage 4 inch (ACE bandage) Every Other Day/30 Days Discharge Instructions: Secure with ACE bandage as directed. Secured With: [employer], 4.5x3.1 (in/yd) (DME) (Generic) Every Other Day/30 Days Discharge Instructions: Secure with Kerlix as directed. Electronic Signature(s) Signed: 06/13/2020 5:35:00 PM By: Worthy Keeler PA-C Signed: 06/13/2020 6:06:42 PM By: Baruch Gouty RN, BSN Entered By: Baruch Gouty on 06/13/2020 13:44:20 -------------------------------------------------------------------------------- Problem List Details Patient Name: Date of Service: Carlos White, [facility] N H. 06/13/2020 12:30 PM Medical Record Number: [id number] Patient Account Number: [id number] Date of Birth/Sex: Treating RN: 1940/04/14 (80 y.o. 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 09/30/2017 No Yes L97.322 Non-pressure chronic ulcer of left ankle with fat layer exposed 09/30/2017 No Yes L97.822 Non-pressure chronic ulcer of other part of left lower leg with fat  layer exposed9/18/2019 No Yes L97.812 Non-pressure chronic ulcer of other part of right lower leg with fat layer 09/30/2017 No Yes exposed I10 Essential (primary) hypertension 09/30/2017 No Yes Inactive Problems Resolved Problems Electronic Signature(s) Signed: 06/13/2020 12:51:39 PM By: Worthy Keeler PA-C Entered By: Worthy Keeler on 06/13/2020 12:51:39 -------------------------------------------------------------------------------- Progress Note Details Patient Name: Date of Service: Carlos Dull RDO N H. 06/13/2020 12:30 PM Medical Record Number: [id number] Patient Account Number: [id number] Date of Birth/Sex: Treating RN: 1940/07/21 (80 y.o. [name] Primary Care Provider: [name] Other Clinician: Referring Provider: Treating Provider/Extender: [name] in Treatment: [phone] Subjective Chief Complaint Information obtained from Patient Bilateral LE Ulcers History of Present Illness (HPI) 09/30/17 on evaluation today patient presents for initial evaluation and our clinic concerning issues that he has been having with his bilateral lower extremities. He states this has been going on for quite some time at least six months. Currently his regiment has been mainly cleaning the area with peroxide, applying the is foreign ointment, and wrapping the area with ABD pads and then an ace wrap loosely. He has dealt with issues of this nature he tells me for quite some time. He does have a history of having had a compound fracture of the left lower extremity which he thinks also makes this a much more difficult area for him to heal. He's previously been told that he had poor vascular flow but this  was years ago at [facility] we do not have any of those records at this time. He has a history of Epidermolysis Bullosa which was diagnosed around age 89 and he has been cared for at [facility] since that time. Subsequently he states this is hereditary and two of his children one  male and one male also have this as well is one of his grandchildren that he is aware of. He has no evidence of infection necessarily at this point although he does have some necrotic tissue noted on the surface of the wound as far as the largest, left lateral lower extremity ulcer, is concerned. Overall I feel like all things considered he's been taking care of this very well. Obviously he has some fairly significant issues going on at this point in this regard. He does have a history otherwise of hypertension though for the most part other than the compound fracture of the left leg he seems to have been fairly healthy in my pinion. 10/07/17 on evaluation today patient actually appears to be doing better in regard to his bilateral lower extremity ulcers. With that being said he does still have some evidence of slough noted on the surface of the wounds I think the Iodoflex has been beneficial for him. His arterial studies are scheduled for October 2. With that being said I do believe that he is continuing to show signs of good improvement which is at least good news. 10/14/17 on evaluation today patient appears to be doing very well in regard to his lower extremity ulcers. He's definitely made some progress as far as healing is concerned although there still are several open areas that are going to need to be addressed. He did have his arterial study today which fortunately shows good findings with a right ABI of 1.23 with a TBI of 0.86 in the left ABI of 1.28 with a TBI of 0.81. This is good news and will allow [place] to perform debridement as well. 10/23/2017; patient with a large wound on the left lateral calf, sizable area on the left medial malleolus and an area on the right lateral malleolus. He has a new blister consistent with his underlying blistering skin disease just above this area we have been using Iodoflex on the lateral left calf lateral right ankle and collagen on the medial left ankle. We  have been using Kerlix Coban wraps 10/28/17 on evaluation today the patient continues to have signs of improvement in regard to the overall appearance of the original wound. Unfortunately he did have some blistering over the right lateral lower extremity which has appeared to rupture on evaluation today and likely some of the dead tissue on the surface needs to be cleaned away the good news is this does not appear to be to significantly deep at this time. 11/04/17 on evaluation today patient actually appears to be doing a little better in regard to his lower extremity ulcers. He has been tolerating the dressing changes without complication. With that being said he does still have a significant wound especially over the left lateral lower extremity unfortunately. All of the wounds pretty much are going to require sharp debridement today. 11/11/17 on evaluation today patient appears to be doing more poorly in regard to his left lower extremity in particular. There does not appear to be any evidence of systemic infection although the wound itself as far as the larger left lateral lower extremity ulcer actually appears to be infected in my pinion. There's an older and  the surface of the wound is dramatically worsened compared to last week. No fevers, chills, nausea, or vomiting noted at this time. 11/18/17 upon evaluation today patient actually appears to be doing better. I did review his culture today which really did not show any specific organism is a positive reason for his wound decline. There are multiple organisms present not predominant. Nonetheless he seems to be tolerate the doxycycline well in his wounds in general do seem to be doing better. Fortunately there does not appear to be any evidence of infection at this time which is good news. Overall I'm very pleased with how things appear. Nonetheless he still has a lot of healing to Carlos. I do think he could benefit from a Juxta-Lite wrap. 11/25/17  on evaluation today patient actually appears to be doing fairly well in regard to his wounds. He is still taking the antibiotics he has a few days left. Fortunately this seems to have been excellent for him as far as getting the infection control and very happy in this regard. With that being said the patient likewise is also very pleased with how things appear at this time in comparison to where we were he's not having as much pain. 12/02/17 Seen today for follow p and management of LLE wounds. Wounds appear to show some improvement. He denies pain, fever, or chills. Completed a course of doxycycline earlier this month. Scheduled to received Juxta-Lite wrap this week. No s/s of infections. 12/09/17 on evaluation today patient actually appears to be doing a little bit better in regard to his wounds. This is obscene very slow process and unfortunately he has a couple of new areas and this is due to the Epidermolysis Bullosa. Nonetheless I am concerned about the fact that he seems to be getting more areas not less that is the reason we're gonna work on getting schedule for the vascular referral to see the venous specialist. 12/23/17 upon evaluation today patient's wounds currently shows evidence of still not doing quite as well is what I would like to have seen. Subsequently the patient did have his venous study which showed evidence of venous stasis. Subsequently I do think that a vascular evaluation for consideration of venous intervention would be appropriate. I'm not necessarily suggesting that will be anything that can be done but I think it is at least a good idea. He is in agreement with this plan. 12/30/17 on evaluation today patient actually appears to be doing very well in regard to his wounds when compared to previous evaluation. Subsequently we have been using the [facility] Dressing which actually appears to have done excellent on his left lateral lower extremity ulcer. The quality of the  wound surface is dramatically improved. There is some slight debridement that is going to be required at a couple of locations but overall I'm extremely pleased with how things appear here. 01/07/2018; this is a patient with a primary skin disorder epidermolyis bullosa. Is a large wound on the left lateral calf and smaller wounds on the right however there is a new wound on the right mid tibia area that occurred within the compression wrap that he did not change. We have been using Hydrofera Blue. On the left he is using Hydrofera Blue and Santyl to the inferior part of the wound and changing the dressing himself. 01/15/2018; primary skin disorder epidermolysis bullosa. He has several difficult wounds including the left lateral calf, smaller wounds on the left medial calf and the right lateral calf.  The major area on the left lateral calf has a smaller area inferiorly that has necrotic debris we have been using Santyl to this. The rest of the wounds we have been using Hydrofera Blue. The area on the left calf actually looks larger this week. Uncontrolled edema several small open areas above it that are superficial 01/20/18 on evaluation today patient appears to be doing better as compared to last week in regard to his wounds of the bilateral lower extremities. He tolerated the bilateral compression wrap without complication. Overall I'm very pleased with how things appear at this time. The patient likewise is very happy. 01/27/18 on evaluation today patient appears to be doing decently well in regard to his bilateral lower extremity ulcers. He's been tolerating the dressing changes without complication. One issue he had is that he did have more drainage to the left leg wrapped last week. He states in fact he probably should come in and let us change it on Friday however he just left it in place and kept adding extra absorption with ABD pads to the external portion of the wrap. Unfortunately he does have  some aspiration type breakdown nothing significant but I do believe that this was probably counterproductive in general. Nonetheless his wounds do not appear to be terrible overall. 02/03/18 on evaluation today patient appears to be doing rather well in regard to his lower extremity ulcers. He has been tolerating the dressing changes without complication. He does tell me that he had to change the wrap on the left one since we last saw him. Subsequently I do not see any evidence of infection I do feel like the food was much better controlled at this point. 02/10/18 on evaluation today patient appears to be doing rather well in regard to his ulcers. He still has significant alterations especially on the left lateral lower extremity. Fortunately there's no signs of infection at this time. Overall I feel like he is making good progress are some areas that I'm gonna attempt some debridement today. 02/17/18 on evaluation today patient appears to be doing okay in regard to his lower Trinity ulcer. It does appear on both locations he has a little bit of drainage causing some breakdown in maceration around the wound bed's although it doesn't appear to be too bad the right is a little bit worse than left. Fortunately there's no signs of infection which is good news. No fevers, chills, nausea, or vomiting noted at this time. 03/10/18 on evaluation today patient actually appears to be doing rather poorly in regard to his bilateral lower extremity ulcers. The right in particular is draining profusely and the wound is actually enlarging which is not good. I'm concerned about both possibly infection and the fact that there's a lot of moisture which is causing breakdown as well. Unfortunately the patient has been trying to change this at home I'm afraid he may need to change more frequently in order to see the improvement that were looking for. There's no signs of systemic infection. 03/17/18 patient actually appears to be  doing significantly better at this point in regard to his bilateral lower extremity ulcers. Fortunately there's no signs of infection. That is worsening infection at least indefinitely nothing systemic. With that being said he is having a lot of drainage though not quite as much is there in his last evaluation. Overall feel like he's on the side of improvement. I think if his results back from his culture which showed that he had a positive  group B strep along with abundant Pseudomonas noted on the culture. For that reason I am gonna have him continue with the linezolid as we previously have ordered for him and I did Carlos ahead as well today and prescribe Levaquin as well in order to treat the Pseudomonas portion of the infection noted. 03/24/18 on evaluation today patient actually appears to be doing very well in regard to his lower Trinity ulcer. He's been tolerating the dressing changes without complication. Fortunately both legs show signs of less drainage in his edema is very well controlled at this point as well. Overall very pleased with how things seem to be progressing. 03/31/18 on evaluation today patient actually appears to be doing excellent in regard to his bilateral lower extremity ulcers. These are not draining nearly as significant as what they were in the past overall seem to be shown signs of excellent improvement which is great news. Fortunately there is no sign of active infection at this time I do believe that the Levaquin has done extremely well for him in this regard. The patient continues to change these at home typically every day. We may be able to slowly work towards every other day changes since the drainage seems to be slowing down quite significantly. 04/14/18 on evaluation today patient appears to be doing well in regard to his bilateral lower extremities. Let me [facility] Almost completely healed which is excellent news. Fortunately he's shown signs of improvement all other  sites as well with new skin growth there's some slight hyper granular tissue but for the most part this seems to be well maintained with the [facility] Dressing. I'm very happy in this regard. 04/28/18 on evaluation today patient appears to be doing rather well in regard to his ulcers of the bilateral lower extremities all things considering. He continues to make some progress as far as new skin growth. There still some hyper granulation noted at this point despite the use of the [facility] Dressing. This is not terrible but I think we may want to consider conclude cauterization today with silver nitrate to try to help knock some of his back as well as helping with any biofilm on the surface of the wound. 05/12/18 on evaluation today patient's wounds actually appear to be doing fairly well in regard to the bilateral lower extremities. He's been tolerating the dressing changes without complication. Fortunately there's no signs of active infection at this time which is good news. Overall very pleased with how things seem to be progressing. You select silver nitrate was beneficial for him. 05/26/18 on evaluation today patient appears to be doing better in regard to left lower extremity and a little bit worse in regard to the right lower extremity. He states that he was pulling off the [facility] Dressing peel back some of the skin making this area significantly larger than what it was previous. He's not had any issues other than this and states even that hasn't caused any pain he just seems to obviously have a much larger area on the right when compared to the previous time I saw him. No fevers, chills, nausea, or vomiting noted at this time. 06/16/18 on evaluation today patient actually appears to be doing a little better in my pinion in regard to his lower summary ulcers. He has new skin islands that seem to be spreading which is good news. Fortunately there's no signs of active infection at this  time. His biggest issue is he tells me that coming  as often as he does is becoming very cost prohibitive. He wonders if we can potentially spread this out. 07/14/18 on evaluation today patient appears to be doing a little bit worse in regard to his lower from the ulcer. Unfortunately he still continues to have a significant amount of drainage I think we need to do something to try to help this more. He is still somewhat reluctant to Carlos the route of the Wound VAC although that may be the most appropriate thing for him. No fevers, chills, nausea, or vomiting noted at this time. 08/18/2018 on evaluation today patient actually appears to be doing quite well with regard to his bilateral lower extremity ulcers. I do feel like that currently he is making great progress the care max does seem to be doing a great job at helping to control the moisture he has no maceration or skin breakdown. Again this seems to be an excellent way to Carlos. 1 thing we may want to change is adding collagen to the base of the wound and then the care max over top he is not opposed to this. 09/15/2018 on evaluation today patient appears to be doing well with regard to his bilateral lower extremity ulcers. He is showing some signs of improvement not necessarily in size but definitely in appearance. In fact he has a lot of new skin growing throughout the wounds along the edges as well as in the central portion of the wounds on both lower extremities. Overall I am extremely pleased to see this. 10/20/2018 on evaluation today patient actually appears to be doing quite well with regard to his wounds. They are not measuring significantly smaller but he does have a lot of new epithelization noted as compared to previous. Fortunately there is no signs of active infection at this time. No fevers, chills, nausea, vomiting, or diarrhea. 11/17/2018 on evaluation today patient presents for reevaluation concerning his bilateral lower extremity ulcers.  Fortunately there is no signs of active infection at this time today. He has been tolerating the dressing changes without complication. No fevers, chills, nausea, vomiting, or diarrhea. Unfortunately in general the patient has not made as much improvement as I would like to have seen up to this point. He has been tolerating the dressing changes without complication and he does an excellent job taking care of his wounds at home in my opinion. The biggest issue I see is that he is just not making the progress that we need to be seeing currently. I think we may want to consider having him seen at a plastic surgery appointment and he has previously seen someone in years past at [facility] in [place]. That is definitely a possibility for [place] to look into at this point. 12/29/2018 on evaluation today patient appears to be doing better in regard to the overall visual appearance of his wounds which do not appear to be as macerated. He does have a much larger skin island in the middle of the left lower extremity ulcer which is doing much better. He tells me the pain is also significantly better. With that being said overall his improvement as far as the size of the wounds is not better but again these are very irregular in change shape quite often. Fortunately there is no evidence of active infection at this time which is great news. He never heard anything from [facility] regarding the plastic surgery referral that we made to them. 01/26/2019 upon evaluation today patient appears to be doing  a little better in regard to his wounds today. He has been tolerating the dressing changes again he performs these for the most part on his own. He does a great job wrapping his legs in my opinion. Unfortunately he has not been able to get down to [facility] to see if there is anything from a plastic surgery standpoint that could be done to help with his legs simply due to the fact that his wife unfortunately  sustained a compression fracture in her spine she is seeing Dr. Saintclair Halsted and subsequently is going to be having what sounds to be a kyphoplasty type procedure. With that being said that has not been scheduled yet there is still waiting on an MRI the patient is very busy in fact overly busy trying to help take care of his wife at this point. I completely understand this is more of a strain on him at this time 02/23/2019 upon evaluation today patient actually appears to be making some progress. I am actually very pleased with the overall appearance of his wounds even compared to last evaluation. He seems to be doing quite well. He is taking care of his wife unfortunately she did have a compression fracture she has had the procedure for this but still she has a slow road to recovery. For that reason he still not gone to [facility] for a second opinion in this regard. Obviously the goal there was if there was anything that can be done from a skin graft standpoint or otherwise. 03/23/2019 upon evaluation today patient continues to have issues with lower extremity ulcers. Since the beginning he has made progress but at the same time the wounds unfortunately just will not close. We have been trying to get the patient to [facility] to see a specialist there but unfortunately with the everything going on with his wife he has not been able to make that appointment time yet he states he may be able to in the next 1-2 months but is not really sure. 04/27/2019 on evaluation today patient appears to be doing a little bit more poorly. His last evaluation. He appears to have some erythema around the edges of the wound at this point. Fortunately there is no signs of active infection at this time which is good news. No fevers, chills, nausea, vomiting, or diarrhea. 06/08/2019 on evaluation today patient appears to be doing well with regard to his wounds. Overall they are actually measuring smaller compared  to the last visit last month. We did treat him for Pseudomonas as well as methicillin-resistant Staph aureus. He was only on the treatment for MRSA however for 7 days as the Cipro was resistant and subsequently we had to place him on doxycycline. Nonetheless I am thinking that we may want to add the doxycycline and just do a month-long treatment considering the longstanding nature of his wounds and see if we get this under better control. 07/13/2019 upon evaluation today patient appears to be doing fairly well in regard to his bilateral lower extremities. There does not appear to be any signs of active infection which is good news. No fevers, chills, nausea, vomiting, or diarrhea. 08/10/2019 upon evaluation today patient appears to be doing about the same with regard to his wounds in general. Unfortunately he is not significantly better although is also not significantly worse which is great news there is no evidence of active infection at this time which is good news. He still dealing with a lot going on  with his wife and therefore is not really able to Carlos see anyone at the specialty clinic at [facility] that we have previously set up still. 09/07/2019 on evaluation today patient appears to be doing well with regard to his wounds. In fact this is probably the best that have seen so far in quite a few months. Overall I am very pleased with where things stand at this time. No fevers, chills, nausea, vomiting, or diarrhea. 10/05/2019 upon evaluation today patient appears to be doing more poorly in regard to his legs at this point. He actually is showing some signs of infection. This has been something that we seem to be back-and-forth with as far as trying to keep these wounds from becoming infected. He takes care of them very well in my opinion but nonetheless I am concerned in this regard. He tells me that his wife is still doing really about the same she is slowly getting better. Nonetheless he still spends  the majority of his time helping to take care of her. 11/02/2019 upon evaluation today patient actually appears to be doing somewhat better in regard to his legs. I do believe that the compounded antibiotic treatment from [facility] has been beneficial for him. Overall I am extremely pleased with where things stand today. There is no signs of active infection at this time. The patient states he has much less drainage than he has in the past. 12/07/2019 upon evaluation today patient appears to be doing well at this time in regard to the overall appearance of his wound bed. Currently there is no signs of active infection at this time. 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 Especially when he stressed. Fortunately there is no signs of active infection locally or systemically at this point. 01/18/2020 on evaluation today patient appears to be doing well with regard to his leg ulcers. He has been tolerating the dressing changes without complication. Fortunately there is no signs of infection and overall I feel like his legs are doing about the best they have done in quite some time. There does not appear to be any evidence of active infection which is great news and overall very pleased. 02/29/2020 upon evaluation today patient's wounds actually appear to be doing quite well currently. There is no sign of active infection at this time. No fevers, chills, nausea, vomiting, or diarrhea. 04/11/2025 on evaluation today patient appears to be doing excellent in regard to his wounds on the legs to be honest. He has made a lot of progress there does not appear to be any signs of active infection and overall I think that he is better than previous although still the wounds are quite significant obviously. In general I think that he is pleased with where  things stand at this point. Still there is quite a bit of work to do here. 07/13/2020 upon evaluation today patient appears to be doing well with regard to his wound. Is been tolerating the dressing changes without complication on both legs. He just has the 2 main wounds on each leg at the ankle on the medial region of the right leg is completely healed. His wounds do seem to have gotten better during the time that he has been on bedrest as result of the pelvis fracture. Obviously does not the way that we  wanted to see things improved but nonetheless he tells me that it is what it is. Fortunately he does seem to be doing better he tells me that her daughter from the [place] is actually down helping to take care of them out as he and his wife while he is recovering. Patient History Information obtained from Patient. Family History Stroke - Maternal Grandparents, No family history of Cancer, Diabetes, Heart Disease, Hereditary Spherocytosis, Hypertension, Kidney Disease, Lung Disease, Seizures, Thyroid Problems, Tuberculosis. Social History Former smoker - quit 1986, Marital Status - Married, Alcohol Use - Never, Drug Use - No History, Caffeine Use - Daily - coffee. Medical History Eyes Patient has history of Cataracts Denies history of Glaucoma, Optic Neuritis Ear/Nose/Mouth/Throat Denies history of Chronic sinus problems/congestion, Middle ear problems Hematologic/Lymphatic Denies history of Anemia, Hemophilia, Human Immunodeficiency Virus, Lymphedema, Sickle Cell Disease Respiratory Patient has history of Chronic Obstructive Pulmonary Disease (COPD) Denies history of Aspiration, Asthma, Pneumothorax, Sleep Apnea, Tuberculosis Cardiovascular Patient has history of Hypertension Denies history of Angina, Arrhythmia, Congestive Heart Failure, Coronary Artery Disease, Deep Vein Thrombosis, Hypotension, Myocardial Infarction, Peripheral Arterial Disease, Peripheral Venous Disease, Phlebitis,  Vasculitis Gastrointestinal Denies history of Cirrhosis , Colitis, Crohnoos, Hepatitis A, Hepatitis B, Hepatitis C Endocrine Denies history of Type I Diabetes, Type II Diabetes Genitourinary Denies history of End Stage Renal Disease Immunological Denies history of Lupus Erythematosus, Raynaudoos, Scleroderma Integumentary (Skin) Denies history of History of Burn Musculoskeletal Denies history of Gout, Rheumatoid Arthritis, Osteoarthritis, Osteomyelitis Neurologic Denies history of Dementia, Neuropathy, Quadriplegia, Paraplegia, Seizure Disorder Oncologic Denies history of Received Chemotherapy, Received Radiation Psychiatric Denies history of Anorexia/bulimia, Confinement Anxiety Hospitalization/Surgery History - 05/08/2020 left hip fx. Objective Constitutional Well-nourished and well-hydrated in no acute distress. Vitals Time Taken: 12:40 PM, Height: 71 in, Weight: 220 lbs, BMI: 30.7, Temperature: 98.2 F, Pulse: 83 bpm, Respiratory Rate: 18 breaths/min, Blood Pressure: 166/73 mmHg. Respiratory normal breathing without difficulty. Psychiatric this patient is able to make decisions and demonstrates good insight into disease process. Alert and Oriented x 3. pleasant and cooperative. General Notes: Upon inspection patient's wound bed actually showed signs of good granulation epithelization at this point. There does not appear to be any signs of infection which is great news and overall very pleased with where things stand today. I actually think he is doing quite well he has lost quite a bit of weight and his legs are very skinny compared to previous. Overall I think he is making good progress here. Integumentary (Hair, Skin) Wound #17 status is Healed - Epithelialized. Original cause of wound was Gradually Appeared. The date acquired was: 09/28/2019. The wound has been in treatment 36 weeks. The wound is located on the Right,Medial Malleolus. The wound measures 0cm length x 0cm  width x 0cm depth; 0cm^2 area and 0cm^3 volume. Wound #18 status is Open. Original cause of wound was Gradually Appeared. The date acquired was: 04/11/2020. The wound has been in treatment 9 weeks. The wound is located on the Right,Proximal,Lateral Lower Leg. The wound measures 0.1cm length x 0.1cm width x 0.1cm depth; 0.008cm^2 area and 0.001cm^3 volume. There is Fat Layer (Subcutaneous Tissue) exposed. There is no tunneling or undermining noted. There is a medium amount of serosanguineous drainage noted. The wound margin is flat and intact. There is large (67-100%) red granulation within the wound bed. There is no necrotic tissue within the wound bed. Wound #2 status is Open. Original cause of wound was Gradually Appeared. The date acquired was: 03/13/2017. The wound has been in  treatment 141 weeks. The wound is located on the Left,Distal,Lateral Lower Leg. The wound measures 12.2cm length x 11.3cm width x 0.1cm depth; 108.275cm^2 area and 10.827cm^3 volume. There is Fat Layer (Subcutaneous Tissue) exposed. There is no tunneling or undermining noted. There is a large amount of serosanguineous drainage noted. The wound margin is flat and intact. There is large (67-100%) red granulation within the wound bed. There is no necrotic tissue within the wound bed. Wound #5 status is Open. Original cause of wound was Gradually Appeared. The date acquired was: 10/26/2017. The wound has been in treatment 137 weeks. The wound is located on the Right,Lateral Lower Leg. The wound measures 7cm length x 13.3cm width x 0.1cm depth; 73.121cm^2 area and 7.312cm^3 volume. There is Fat Layer (Subcutaneous Tissue) exposed. There is no tunneling or undermining noted. There is a large amount of serosanguineous drainage noted. The wound margin is flat and intact. There is large (67-100%) red granulation within the wound bed. There is no necrotic tissue within the wound bed. 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 - on days that dressing is changed Edema Control - Lymphedema / SCD / Other: Elevate legs to the level of the heart or above for 30 minutes daily and/or when sitting, a frequency of: - throughout the day Avoid standing for long periods of time. Exercise regularly Moisturize legs daily. Compression stocking or Garment 20-30 mm/Hg pressure to: - Juxtalite to both legs daily Home Health: No change in wound care orders this week; continue Home Health for wound care. May utilize formulary equivalent dressing for wound treatment orders unless otherwise specified. Dressing changes to be completed by [facility] on Monday / Wednesday / Friday except when patient has scheduled visit at [facility]. Other Home Health Orders/Instructions: - [facility] WOUND #18: - Lower Leg Wound Laterality: Right, Lateral, Proximal Cleanser: Soap and Water Every Other Day/30 Days Discharge Instructions: May shower and wash wound with dial antibacterial soap and water prior to dressing change. Peri-Wound Care: Zinc Oxide Ointment 30g tube Every Other Day/30 Days Discharge Instructions: Apply Zinc Oxide to periwound with each dressing change as needed for maceration Peri-Wound Care: Sween Lotion (Moisturizing lotion) Every Other Day/30 Days Discharge Instructions: Apply moisturizing lotion as directed Prim Dressing: Fibracol Plus Dressing, 4x4.38 in (collagen) Every Other Day/30 Days ary Discharge Instructions: Moisten collagen with saline or  hydrogel Secondary Dressing: ABD Pad, 5x9 (DME) (Generic) Every Other Day/30 Days Discharge Instructions: Apply over primary dressing as directed. Secondary Dressing: Zetuvit Plus 4x8 in Every Other Day/30 Days Discharge Instructions: Apply over primary dressing as directed. Secondary Dressing: Xtrasorb Classic Super Absorbent Dressing, 6x9 (in/in) (DME) (Dispense As Written) Every Other Day/30 Days Discharge Instructions: Apply over primary dressing as directed. Secured With: Elastic Bandage 4 inch (ACE bandage) Every Other Day/30 Days Discharge Instructions: Secure with ACE bandage as directed. Secured With: [employer], 4.5x3.1 (in/yd) (DME) (Generic) Every Other Day/30 Days Discharge Instructions: Secure with Kerlix as directed. WOUND #2: - Lower Leg Wound Laterality: Left, Lateral, Distal Cleanser: Soap and Water Every Other Day/30 Days Discharge Instructions: May shower and wash wound  with dial antibacterial soap and water prior to dressing change. Peri-Wound Care: Zinc Oxide Ointment 30g tube Every Other Day/30 Days Discharge Instructions: Apply Zinc Oxide to periwound with each dressing change as needed for maceration Peri-Wound Care: Sween Lotion (Moisturizing lotion) Every Other Day/30 Days Discharge Instructions: Apply moisturizing lotion as directed Prim Dressing: Fibracol Plus Dressing, 4x4.38 in (collagen) Every Other Day/30 Days ary Discharge Instructions: Moisten collagen with saline or hydrogel Secondary Dressing: ABD Pad, 5x9 (DME) (Generic) Every Other Day/30 Days Discharge Instructions: Apply over primary dressing as directed. Secondary Dressing: Zetuvit Plus 4x8 in Every Other Day/30 Days Discharge Instructions: Apply over primary dressing as directed. Secondary Dressing: Xtrasorb Classic Super Absorbent Dressing, 6x9 (in/in) (DME) (Dispense As Written) Every Other Day/30 Days Discharge Instructions: Apply over primary dressing as directed. Secured With:  Elastic Bandage 4 inch (ACE bandage) Every Other Day/30 Days Discharge Instructions: Secure with ACE bandage as directed. Secured With: [employer], 4.5x3.1 (in/yd) (Generic) Every Other Day/30 Days Discharge Instructions: Secure with Kerlix as directed. WOUND #5: - Lower Leg Wound Laterality: Right, Lateral Cleanser: Soap and Water Every Other Day/30 Days Discharge Instructions: May shower and wash wound with dial antibacterial soap and water prior to dressing change. Peri-Wound Care: Zinc Oxide Ointment 30g tube Every Other Day/30 Days Discharge Instructions: Apply Zinc Oxide to periwound with each dressing change as needed for maceration Peri-Wound Care: Sween Lotion (Moisturizing lotion) Every Other Day/30 Days Discharge Instructions: Apply moisturizing lotion as directed Prim Dressing: Fibracol Plus Dressing, 4x4.38 in (collagen) Every Other Day/30 Days ary Discharge Instructions: Moisten collagen with saline or hydrogel Secondary Dressing: ABD Pad, 5x9 (DME) (Generic) Every Other Day/30 Days Discharge Instructions: Apply over primary dressing as directed. Secondary Dressing: Zetuvit Plus 4x8 in Every Other Day/30 Days Discharge Instructions: Apply over primary dressing as directed. Secondary Dressing: Xtrasorb Classic Super Absorbent Dressing, 6x9 (in/in) (DME) (Dispense As Written) Every Other Day/30 Days Discharge Instructions: Apply over primary dressing as directed. Secured With: Elastic Bandage 4 inch (ACE bandage) Every Other Day/30 Days Discharge Instructions: Secure with ACE bandage as directed. Secured With: [employer], 4.5x3.1 (in/yd) (DME) (Generic) Every Other Day/30 Days Discharge Instructions: Secure with Kerlix as directed. 1. Would recommend that we going continue with wound care measures as before and the patient is in agreement the plan. This includes the use of the silver collagen dressing which I think has done well for him. 2. I am also can  recommend that we have the patient going continue with his wrapping as he has been doing. This includes the use of the ABD pads followed by the XtraSorb. And then subsequently he uses an Ace bandage to secure. Following this he is applying his juxta light compression over top of the Ace bandage. Overall this seems to have done well up to this point and gives him the ability to change these himself. We will see patient back for reevaluation in 6 weeks here in the clinic. If anything worsens or changes patient will contact our office for additional recommendations. Electronic Signature(s) Signed: 06/13/2020 5:35:00 PM By: Worthy Keeler PA-C Signed: 06/13/2020 6:06:42 PM By: Baruch Gouty RN, BSN Previous Signature: 06/13/2020 1:09:08 PM Version By: Worthy Keeler PA-C Entered By: Baruch Gouty on 06/13/2020 13:44:44 -------------------------------------------------------------------------------- HxROS Details Patient Name: Date of Service: Carlos White, Carlos White. 06/13/2020 12:30 PM Medical Record Number: [id number] Patient Account Number: [id number] Date of Birth/Sex: Treating RN: 1940/10/27 (80 y.o. [name] Primary Care Provider: [name] Other  Clinician: Referring Provider: Treating Provider/Extender: [name] in Treatment: 141 Information Obtained From Patient Eyes Medical History: Positive for: Cataracts Negative for: Glaucoma; Optic Neuritis Ear/Nose/Mouth/Throat Medical History: Negative for: Chronic sinus problems/congestion; Middle ear problems Hematologic/Lymphatic Medical History: Negative for: Anemia; Hemophilia; Human Immunodeficiency Virus; Lymphedema; Sickle Cell Disease Respiratory Medical History: Positive for: Chronic Obstructive Pulmonary Disease (COPD) Negative for: Aspiration; Asthma; Pneumothorax; Sleep Apnea; Tuberculosis Cardiovascular Medical History: Positive for: Hypertension Negative for: Angina; Arrhythmia; Congestive  Heart Failure; Coronary Artery Disease; Deep Vein Thrombosis; Hypotension; Myocardial Infarction; Peripheral Arterial Disease; Peripheral Venous Disease; Phlebitis; Vasculitis Gastrointestinal Medical History: Negative for: Cirrhosis ; Colitis; Crohns; Hepatitis A; Hepatitis B; Hepatitis C Endocrine Medical History: Negative for: Type I Diabetes; Type II Diabetes Genitourinary Medical History: Negative for: End Stage Renal Disease Immunological Medical History: Negative for: Lupus Erythematosus; Raynauds; Scleroderma Integumentary (Skin) Medical History: Negative for: History of Burn Musculoskeletal Medical History: Negative for: Gout; Rheumatoid Arthritis; Osteoarthritis; Osteomyelitis Neurologic Medical History: Negative for: Dementia; Neuropathy; Quadriplegia; Paraplegia; Seizure Disorder Oncologic Medical History: Negative for: Received Chemotherapy; Received Radiation Psychiatric Medical History: Negative for: Anorexia/bulimia; Confinement Anxiety HBO Extended History Items Eyes: Cataracts Immunizations Pneumococcal Vaccine: Received Pneumococcal Vaccination: No Implantable Devices No devices added Hospitalization / Surgery History Type of Hospitalization/Surgery 05/08/2020 left hip fx Family and Social History Cancer: No; Diabetes: No; Heart Disease: No; Hereditary Spherocytosis: No; Hypertension: No; Kidney Disease: No; Lung Disease: No; Seizures: No; Stroke: Yes - Maternal Grandparents; Thyroid Problems: No; Tuberculosis: No; Former smoker - quit 1986; Marital Status - Married; Alcohol Use: Never; Drug Use: No History; Caffeine Use: Daily - coffee; Financial Concerns: No; Food, Clothing or Shelter Needs: No; Support System Lacking: No; Transportation Concerns: No Electronic Signature(s) Signed: 06/13/2020 5:35:00 PM By: Worthy Keeler PA-C Signed: 06/13/2020 5:52:00 PM By: [name] Entered By: [name] on 06/13/2020  12:46:01 -------------------------------------------------------------------------------- SuperBill Details Patient Name: Date of Service: Carlos White, Carlos White 06/13/2020 Medical Record Number: [id number] Patient Account Number: [id number] Date of Birth/Sex: Treating RN: January 31, 1940 (80 y.o. 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.812 Non-pressure chronic ulcer of other part of right lower leg with fat layer exposed [facility] (primary) hypertension Facility Procedures CPT4 Code: 23536144 Description: 31540 - WOUND CARE VISIT-LEV 5 EST PT Modifier: Quantity: 1 Physician Procedures : CPT4 Code Description Modifier [id number] - WC PHYS LEVEL 3 - EST PT ICD-10 Diagnosis Description I87.2 Venous insufficiency (chronic) (peripheral) 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

## 2020-06-13 NOTE — Progress Notes (Signed)
KARSTON, HYLAND ([id number]) Visit Report for 06/13/2020 Fall Risk Assessment Details Patient Name: Date of Service: [name] 06/13/2020 12:30 PM Medical Record Number: [id number] Patient Account Number: [id number] Date of Birth/Sex: Treating RN: 02-Mar-1940 (80 y.o. [name] Primary Care Vishal Sandlin: [name] Other Clinician: Referring Trinitee Horgan: Treating [place] Murthy/Extender: [name] in Treatment: [phone] Fall Risk Assessment Items Have you had 2 or more falls in the last 12 monthso 0 No Have you had any fall that resulted in injury in the last 12 monthso 0 Yes FALLS RISK SCREEN History of falling - immediate or within 3 months 25 Yes Secondary diagnosis (Do you have 2 or more medical diagnoseso) 0 No Ambulatory aid None/bed rest/wheelchair/nurse 0 No Crutches/cane/walker 15 Yes Furniture 0 No Intravenous therapy Access/Saline/Heparin Lock 0 No Gait/Transferring Normal/ bed rest/ wheelchair 0 Yes Weak (short steps with or without shuffle, stooped but able to lift head while walking, may seek 0 No support from furniture) Impaired (short steps with shuffle, may have difficulty arising from chair, head down, impaired 0 No balance) Mental Status Oriented to own ability 0 Yes Electronic Signature(s) Signed: 06/13/2020 5:52:00 PM By: [name] Entered By: [name] on 06/13/2020 12:48:42

## 2020-06-14 DIAGNOSIS — S81809A Unspecified open wound, unspecified lower leg, initial encounter: Secondary | ICD-10-CM | POA: Diagnosis not present

## 2020-06-14 DIAGNOSIS — L97822 Non-pressure chronic ulcer of other part of left lower leg with fat layer exposed: Secondary | ICD-10-CM | POA: Diagnosis not present

## 2020-06-14 DIAGNOSIS — L97322 Non-pressure chronic ulcer of left ankle with fat layer exposed: Secondary | ICD-10-CM | POA: Diagnosis not present

## 2020-06-14 DIAGNOSIS — L97812 Non-pressure chronic ulcer of other part of right lower leg with fat layer exposed: Secondary | ICD-10-CM | POA: Diagnosis not present

## 2020-06-14 DIAGNOSIS — S91301A Unspecified open wound, right foot, initial encounter: Secondary | ICD-10-CM | POA: Diagnosis not present

## 2020-06-14 NOTE — Progress Notes (Addendum)
KORAN, SEABROOK ([id number]) Visit Report for 06/13/2020 Arrival Information Details Patient Name: Date of Service: Carlos White 06/13/2020 12:30 PM Medical Record Number: [id number] Patient Account Number: [id number] Date of Birth/Sex: Treating RN: 12-06-1940 (80 y.o. [name], [name] Primary Care Jah Alarid: [name] Other Clinician: Referring Sheilla Maris: Treating Jernee Murtaugh/Extender: [name] in Treatment: 32 Visit Information History Since Last Visit Added or deleted any medications: No Patient Arrived: Walker Any new allergies or adverse reactions: No Arrival Time: 12:40 Had a fall or experienced change in Yes Accompanied By: self activities of daily living that may affect Transfer Assistance: None risk of falls: Patient Identification Verified: Yes Signs or symptoms of abuse/neglect since last visito No Secondary Verification Process Completed: Yes Hospitalized since last visit: Yes Patient Requires Transmission-Based Precautions: No Implantable device outside of the clinic excluding No Patient Has Alerts: Yes cellular tissue based products placed in the center Patient Alerts: R ABI= 1.23, TBI = .86 since last visit: L ABI= 1.28, TBI=.81 Has Dressing in Place as Prescribed: Yes Has Compression in Place as Prescribed: Yes Pain Present Now: No Electronic Signature(s) Signed: 06/13/2020 5:52:00 PM By: [name] Entered By: [name] on 06/13/2020 12:45:37 -------------------------------------------------------------------------------- Clinic Level of Care Assessment Details Patient Name: Date of Service: Carlos White 06/13/2020 12:30 PM Medical Record Number: [id number] Patient Account Number: [id number] Date of Birth/Sex: Treating RN: 05/24/1940 (80 y.o. [name] Primary Care Mariaclara Spear: [name] Other Clinician: Referring Shad Ledvina: Treating Vieno Tarrant/Extender: [name] in Treatment: [place] Assessment Items TOOL 4 Quantity Score [] - 0 Use when only an EandM is performed on FOLLOW-UP visit ASSESSMENTS - Nursing Assessment / Reassessment X- 1 10 Reassessment of Co-morbidities (includes updates in patient status) X- 1 5 Reassessment of Adherence to Treatment Plan ASSESSMENTS - Wound and Skin A ssessment / Reassessment [] - 0 Simple Wound Assessment / Reassessment - one wound X- 3 5 Complex Wound Assessment / Reassessment - multiple wounds [] - 0 Dermatologic / Skin Assessment (not related to wound area) ASSESSMENTS - Focused Assessment X- 2 5 Circumferential Edema Measurements - multi extremities [] - 0 Nutritional Assessment / Counseling / Intervention X- 1 5 Lower Extremity Assessment (monofilament, tuning fork, pulses) [] - 0 Peripheral Arterial Disease Assessment (using hand held doppler) ASSESSMENTS - Ostomy and/or Continence Assessment and Care [] - 0 Incontinence Assessment and Management [] - 0 Ostomy Care Assessment and Management (repouching, etc.) PROCESS - Coordination of Care X - Simple Patient / Family Education for ongoing care 1 15 [] - 0 Complex (extensive) Patient / Family Education for ongoing care X- 1 10 Staff obtains Programmer, systems, Records, T Results / Process Orders est [] - 0 Staff telephones HHA, Nursing Homes / Clarify orders / etc [] - 0 Routine Transfer to another Facility (non-emergent condition) [] - 0 Routine Hospital Admission (non-emergent condition) X- 1 15 New Admissions / Biomedical engineer / Ordering NPWT Apligraf, etc. , [] - 0 Emergency Hospital Admission (emergent condition) X- 1 10 Simple Discharge Coordination [] - 0 Complex (extensive) Discharge Coordination PROCESS - Special Needs [] - 0 Pediatric / Minor Patient Management [] - 0 Isolation Patient Management [] - 0 Hearing / Language / Visual special needs [] - 0 Assessment of Community assistance (transportation, D/C planning,  etc.) [] - 0 Additional assistance / Altered mentation [] - 0 Support Surface(s) Assessment (bed, cushion, seat, etc.) INTERVENTIONS - Wound  Cleansing / Measurement [] - 0 Simple Wound Cleansing - one wound X- 3 5 Complex Wound Cleansing - multiple wounds X- 1 5 Wound Imaging (photographs - any number of wounds) [] - 0 Wound Tracing (instead of photographs) [] - 0 Simple Wound Measurement - one wound X- 3 5 Complex Wound Measurement - multiple wounds INTERVENTIONS - Wound Dressings [] - 0 Small Wound Dressing one or multiple wounds X- 2 15 Medium Wound Dressing one or multiple wounds [] - 0 Large Wound Dressing one or multiple wounds X- 1 5 Application of Medications - topical [] - 0 Application of Medications - injection INTERVENTIONS - Miscellaneous [] - 0 External ear exam [] - 0 Specimen Collection (cultures, biopsies, blood, body fluids, etc.) [] - 0 Specimen(s) / Culture(s) sent or taken to Lab for analysis [] - 0 Patient Transfer (multiple staff / Civil Service fast streamer / Similar devices) [] - 0 Simple Staple / Suture removal (25 or less) [] - 0 Complex Staple / Suture removal (26 or more) [] - 0 Hypo / Hyperglycemic Management (close monitor of Blood Glucose) [] - 0 Ankle / Brachial Index (ABI) - do not check if billed separately X- 1 5 Vital Signs Has the patient been seen at the hospital within the last three years: Yes Total Score: 170 Level Of Care: New/Established - Level 5 Electronic Signature(s) Signed: 06/13/2020 6:06:42 PM By: Baruch Gouty RN, BSN Entered By: Baruch Gouty on 06/13/2020 13:05:03 -------------------------------------------------------------------------------- Encounter Discharge Information Details Patient Name: Date of Service: [name], GO RDO N H. 06/13/2020 12:30 PM Medical Record Number: [id number] Patient Account Number: [id number] Date of Birth/Sex: Treating RN: 04-25-1940 (80 y.o. Burnadette Pop, Lauren Primary Care Alisen Marsiglia: [name] Other Clinician: Referring Marki Frede: Treating Lyndzee Kliebert/Extender: [name] in Treatment: [phone] Encounter Discharge Information Items Discharge Condition: Stable Ambulatory Status: Walker Discharge Destination: Home Transportation: Private Auto Accompanied By: self Schedule Follow-up Appointment: Yes Clinical Summary of Care: Patient Declined Electronic Signature(s) Signed: 06/14/2020 12:00:56 PM By: Rhae Hammock RN Entered By: Rhae Hammock on 06/13/2020 13:23:37 -------------------------------------------------------------------------------- Lower Extremity Assessment Details Patient Name: Date of Service: [name] 06/13/2020 12:30 PM Medical Record Number: [id number] Patient Account Number: [id number] Date of Birth/Sex: Treating RN: 1940/12/03 (80 y.o. [name] Primary Care Keyira Mondesir: [name] Other Clinician: Referring Robertson Colclough: Treating Keilan Nichol/Extender: [name] in Treatment: 141 Edema Assessment Assessed: Shirlyn Goltz: Yes] Patrice Paradise: Yes] Edema: [Left: Yes] [Right: Yes] Calf Left: Right: Point of Measurement: 35.5 cm From Medial Instep 28 cm 38.5 cm Ankle Left: Right: Point of Measurement: 11 cm From Medial Instep 26.5 cm 24 cm Vascular Assessment Pulses: Dorsalis Pedis Palpable: [Left:Yes] [Right:Yes] Electronic Signature(s) Signed: 06/13/2020 5:52:00 PM By: [name] Entered By: [name] on 06/13/2020 12:46:42 -------------------------------------------------------------------------------- [place] Details Patient Name: Date of Service: [name], [place]. 06/13/2020 12:30 PM Medical Record Number: [id number] Patient Account Number: [id number] Date of Birth/Sex: Treating RN: Jun 04, 1940 (80 y.o. [name] Primary Care Regis Hinton: [name] Other Clinician: Referring Maheen Cwikla: Treating Taneshia Lorence/Extender: [name] in Treatment:  [place] reviewed with physician Active Inactive Venous Leg Ulcer Nursing Diagnoses: Knowledge deficit related to disease process and management Potential for venous Insuffiency (use before diagnosis confirmed) Goals: Patient will maintain optimal edema control Date Initiated: 09/30/2017 Target Resolution Date: 07/11/2020 Goal Status: Active Patient/caregiver will verbalize understanding of disease process and disease management Date Initiated: 09/30/2017 Date Inactivated: 11/04/2017  Target Resolution Date: 10/30/2017 Goal Status: Met Interventions: Assess peripheral edema status every visit. Provide education on venous insufficiency Notes: Wound/Skin Impairment Nursing Diagnoses: Impaired tissue integrity Knowledge deficit related to ulceration/compromised skin integrity Goals: Patient/caregiver will verbalize understanding of skin care regimen Date Initiated: 09/30/2017 Target Resolution Date: 07/11/2020 Goal Status: Active Ulcer/skin breakdown will have a volume reduction of 30% by week 4 Date Initiated: 09/30/2017 Date Inactivated: 11/04/2017 Target Resolution Date: 10/30/2017 Goal Status: Met Interventions: Assess patient/caregiver ability to obtain necessary supplies Assess patient/caregiver ability to perform ulcer/skin care regimen upon admission and as needed Assess ulceration(s) every visit Provide education on ulcer and skin care Notes: Electronic Signature(s) Signed: 06/13/2020 6:06:42 PM By: Baruch Gouty RN, BSN Entered By: Baruch Gouty on 06/13/2020 12:51:33 -------------------------------------------------------------------------------- Pain Assessment Details Patient Name: Date of Service: [name], [place]. 06/13/2020 12:30 PM Medical Record Number: [id number] Patient Account Number: [id number] Date of Birth/Sex: Treating RN: 03-15-1940 (80 y.o. [name] Primary Care Maxson Oddo: [name] Other Clinician: Referring  Auden Tatar: Treating Markail Diekman/Extender: [name] in Treatment: [phone] Active Problems Location of Pain Severity and Description of Pain Patient Has Paino No Site Locations Rate the pain. Current Pain Level: 0 Pain Management and Medication Current Pain Management: Medication: No Cold Application: No Rest: No Massage: No Activity: No T.E.N.S.: No Heat Application: No Leg drop or elevation: No Is the Current Pain Management Adequate: Adequate How does your wound impact your activities of daily livingo Sleep: No Bathing: No Appetite: No Relationship With Others: No Bladder Continence: No Emotions: No Bowel Continence: No Work: No Toileting: No Drive: No Dressing: No Hobbies: No Electronic Signature(s) Signed: 06/13/2020 5:52:00 PM By: [name] Entered By: [name] on 06/13/2020 12:46:24 -------------------------------------------------------------------------------- Patient/Caregiver Education Details Patient Name: Date of Service: Carlos White 6/1/2022andnbsp12:30 PM Medical Record Number: [id number] Patient Account Number: [id number] Date of Birth/Gender: Treating RN: 09-15-1940 (80 y.o. [name] Primary Care Physician: [name] Other Clinician: Referring Physician: Treating Physician/Extender: [name] in Treatment: [id number] Education Assessment Education Provided To: Patient Education Topics Provided Venous: Methods: Explain/Verbal Responses: Reinforcements needed, State content correctly Wound/Skin Impairment: Methods: Explain/Verbal Responses: Reinforcements needed, State content correctly Electronic Signature(s) Signed: 06/13/2020 6:06:42 PM By: Baruch Gouty RN, BSN Entered By: Baruch Gouty on 06/13/2020 12:51:56 -------------------------------------------------------------------------------- Wound Assessment Details Patient Name: Date of Service: [name]  06/13/2020 12:30 PM Medical Record Number: [id number] Patient Account Number: [id number] Date of Birth/Sex: Treating RN: 1940-11-13 (80 y.o. [name], [name] Primary Care Leahanna Buser: [name] Other Clinician: Referring Swetha Swails: Treating Seraphim Affinito/Extender: [name] in Treatment: 141 Wound Status Wound Number: 17 Primary Etiology: Venous Leg Ulcer Wound Location: Right, Medial Malleolus Wound Status: Healed - Epithelialized Wounding Event: Gradually Appeared Date Acquired: 09/28/2019 Weeks Of Treatment: 36 Clustered Wound: No Wound Measurements Length: (cm) Width: (cm) Depth: (cm) Area: (cm) Volume: (cm) 0 % Reduction in Area: 100% 0 % Reduction in Volume: 100% 0 0 0 Wound Description Classification: Full Thickness Without Exposed Support Structur es Treatment Notes Wound #17 (Malleolus) Wound Laterality: Right, Medial Cleanser Peri-Wound Care Topical Primary Dressing Secondary Dressing Secured With Compression Wrap Compression Stockings Add-Ons Electronic Signature(s) Signed: 06/13/2020 5:52:00 PM By: [name] Entered By: [name] on 06/13/2020 12:46:52 -------------------------------------------------------------------------------- Wound Assessment Details Patient Name: Date of Service: [name] 06/13/2020 12:30 PM Medical Record Number: [id number] Patient Account Number: [id number] Date of Birth/Sex: Treating  RN: 1940-03-30 (80 y.o. [name] Primary Care Provider: [name] Other Clinician: Referring Provider: Treating Provider/Extender: [name] in Treatment: 141 Wound Status Wound Number: 18 Primary Vasculopathy Etiology: Wound Location: Right, Proximal, Lateral Lower Leg Wound Status: Open Wounding Event: Gradually Appeared Comorbid Cataracts, Chronic Obstructive Pulmonary Disease (COPD), Date Acquired: 04/11/2020 History: Hypertension Weeks Of Treatment: 9 Clustered  Wound: No Wound Measurements Length: (cm) 0.1 Width: (cm) 0.1 Depth: (cm) 0.1 Area: (cm) 0.008 Volume: (cm) 0.001 % Reduction in Area: 99.9% % Reduction in Volume: 99.9% Epithelialization: Large (67-100%) Tunneling: No Undermining: No Wound Description Classification: Full Thickness Without Exposed Support Structures Wound Margin: Flat and Intact Exudate Amount: Medium Exudate Type: Serosanguineous Exudate Color: red, brown Foul Odor After Cleansing: No Slough/Fibrino No Wound Bed Granulation Amount: Large (67-100%) Exposed Structure Granulation Quality: Red Fascia Exposed: No Necrotic Amount: None Present (0%) Fat Layer (Subcutaneous Tissue) Exposed: Yes Tendon Exposed: No Muscle Exposed: No Joint Exposed: No Bone Exposed: No Treatment Notes Wound #18 (Lower Leg) Wound Laterality: Right, Lateral, Proximal Cleanser Discharge Instruction: May shower and wash wound with dial antibacterial soap and water prior to dressing change. Peri-Wound Care Sween Lotion (Moisturizing lotion) Discharge Instruction: Apply moisturizing lotion as directed Zinc Oxide Ointment 30g tube Discharge Instruction: Apply Zinc Oxide to periwound with each dressing change as needed for maceration Topical Primary Dressing Fibracol Plus Dressing, 4x4.38 in (collagen) Discharge Instruction: Moisten collagen with saline or hydrogel Secondary Dressing Zetuvit Plus 4x8 in Discharge Instruction: Apply over primary dressing as directed. ABD Pad, 5x9 Discharge Instruction: Apply over primary dressing as directed. Xtrasorb Classic Super [employer], 6x9 (in/in) Discharge Instruction: Apply over primary dressing as directed. Secured With Elastic Bandage 4 inch (ACE bandage) Discharge Instruction: Secure with ACE bandage as directed. Kerlix Roll Sterile, 4.5x3.1 (in/yd) Discharge Instruction: Secure with Kerlix as directed. Compression Wrap Compression Stockings Add-Ons Electronic  Signature(s) Signed: 06/13/2020 5:52:00 PM By: [name] Entered By: [name] on 06/13/2020 12:47:11 -------------------------------------------------------------------------------- Wound Assessment Details Patient Name: Date of Service: Carlos White 06/13/2020 12:30 PM Medical Record Number: [id number] Patient Account Number: [id number] Date of Birth/Sex: Treating RN: September 30, 1940 (80 y.o. [name] Primary Care Provider: [name] Other Clinician: Referring Provider: Treating Provider/Extender: [name] in Treatment: 141 Wound Status Wound Number: 2 Primary Venous Leg Ulcer Etiology: Wound Location: Left, Distal, Lateral Lower Leg Wound Status: Open Wounding Event: Gradually Appeared Comorbid Cataracts, Chronic Obstructive Pulmonary Disease (COPD), Date Acquired: 03/13/2017 History: Hypertension Weeks Of Treatment: 141 Clustered Wound: No Photos Wound Measurements Length: (cm) 12.2 Width: (cm) 11.3 Depth: (cm) 0.1 Area: (cm) 108.275 Volume: (cm) 10.827 Wound Description Classification: Full Thickness Without Exposed Support Struct Wound Margin: Flat and Intact Exudate Amount: Large Exudate Type: Serosanguineous Exudate Color: red, brown ures Foul Odor After Cleansing: No Slough/Fibrino No % Reduction in Area: -39.7% % Reduction in Volume: 72.1% Epithelialization: Medium (34-66%) Tunneling: No Undermining: No Wound Bed Granulation Amount: Large (67-100%) Exposed Structure Granulation Quality: Red Fascia Exposed: No Necrotic Amount: None Present (0%) Fat Layer (Subcutaneous Tissue) Exposed: Yes Tendon Exposed: No Muscle Exposed: No Joint Exposed: No Bone Exposed: No Treatment Notes Wound #2 (Lower Leg) Wound Laterality: Left, Lateral, Distal Cleanser Discharge Instruction: May shower and wash wound with dial antibacterial soap and water prior to dressing change. Peri-Wound Care Sween Lotion (Moisturizing  lotion) Discharge Instruction: Apply moisturizing lotion as directed Zinc Oxide Ointment 30g tube Discharge Instruction: Apply Zinc Oxide to periwound with  each dressing change as needed for maceration Topical Primary Dressing Fibracol Plus Dressing, 4x4.38 in (collagen) Discharge Instruction: Moisten collagen with saline or hydrogel Secondary Dressing Zetuvit Plus 4x8 in Discharge Instruction: Apply over primary dressing as directed. ABD Pad, 5x9 Discharge Instruction: Apply over primary dressing as directed. Xtrasorb Classic Super [employer], 6x9 (in/in) Discharge Instruction: Apply over primary dressing as directed. Secured With Elastic Bandage 4 inch (ACE bandage) Discharge Instruction: Secure with ACE bandage as directed. Kerlix Roll Sterile, 4.5x3.1 (in/yd) Discharge Instruction: Secure with Kerlix as directed. Compression Wrap Compression Stockings Add-Ons Electronic Signature(s) Signed: 06/14/2020 1:41:47 PM By: Sandre Kitty Signed: 06/14/2020 5:16:10 PM By: [name] Previous Signature: 06/13/2020 5:52:00 PM Version By: [name] Entered By: Sandre Kitty on 06/14/2020 13:01:18 -------------------------------------------------------------------------------- Wound Assessment Details Patient Name: Date of Service: [name], [place] 06/13/2020 12:30 PM Medical Record Number: [id number] Patient Account Number: [id number] Date of Birth/Sex: Treating RN: 11/05/1940 (80 y.o. [name] Primary Care Provider: Other Clinician: [name] Referring Provider: Treating Provider/Extender: [name] in Treatment: 141 Wound Status Wound Number: 5 Primary Vasculopathy Etiology: Wound Location: Right, Lateral Lower Leg Wound Status: Open Wounding Event: Gradually Appeared Comorbid Cataracts, Chronic Obstructive Pulmonary Disease (COPD), Date Acquired: 10/26/2017 History: Hypertension Weeks Of Treatment: 137 Clustered Wound:  Yes Photos Wound Measurements Length: (cm) 7 Width: (cm) 13.3 Depth: (cm) 0.1 Clustered Quantity: 2 Area: (cm) 73.121 Volume: (cm) 7.312 % Reduction in Area: -34.7% % Reduction in Volume: -34.7% Epithelialization: Small (1-33%) Tunneling: No Undermining: No Wound Description Classification: Full Thickness Without Exposed Support Structures Wound Margin: Flat and Intact Exudate Amount: Large Exudate Type: Serosanguineous Exudate Color: red, brown Foul Odor After Cleansing: No Slough/Fibrino No Wound Bed Granulation Amount: Large (67-100%) Exposed Structure Granulation Quality: Red Fascia Exposed: No Necrotic Amount: None Present (0%) Fat Layer (Subcutaneous Tissue) Exposed: Yes Tendon Exposed: No Muscle Exposed: No Joint Exposed: No Bone Exposed: No Treatment Notes Wound #5 (Lower Leg) Wound Laterality: Right, Lateral Cleanser Discharge Instruction: May shower and wash wound with dial antibacterial soap and water prior to dressing change. Peri-Wound Care Sween Lotion (Moisturizing lotion) Discharge Instruction: Apply moisturizing lotion as directed Zinc Oxide Ointment 30g tube Discharge Instruction: Apply Zinc Oxide to periwound with each dressing change as needed for maceration Topical Primary Dressing Fibracol Plus Dressing, 4x4.38 in (collagen) Discharge Instruction: Moisten collagen with saline or hydrogel Secondary Dressing Zetuvit Plus 4x8 in Discharge Instruction: Apply over primary dressing as directed. ABD Pad, 5x9 Discharge Instruction: Apply over primary dressing as directed. Xtrasorb Classic Super [employer], 6x9 (in/in) Discharge Instruction: Apply over primary dressing as directed. Secured With Elastic Bandage 4 inch (ACE bandage) Discharge Instruction: Secure with ACE bandage as directed. Kerlix Roll Sterile, 4.5x3.1 (in/yd) Discharge Instruction: Secure with Kerlix as directed. Compression Wrap Compression  Stockings Add-Ons Electronic Signature(s) Signed: 06/14/2020 1:41:47 PM By: Sandre Kitty Signed: 06/14/2020 5:16:10 PM By: [name] Previous Signature: 06/13/2020 5:52:00 PM Version By: [name] Entered By: Sandre Kitty on 06/14/2020 12:58:51 -------------------------------------------------------------------------------- Vitals Details Patient Name: Date of Service: [name], GO RDO N H. 06/13/2020 12:30 PM Medical Record Number: [id number] Patient Account Number: [id number] Date of Birth/Sex: Treating RN: 01-25-1940 (80 y.o. [name], [name] Primary Care Provider: [name] Other Clinician: Referring Provider: Treating Provider/Extender: [name] in Treatment: 141 Vital Signs Time Taken: 12:40 Temperature (F): 98.2 Height (in): 71 Pulse (bpm): 83 Weight (lbs): 220 Respiratory Rate (breaths/min): 18 Body Mass Index (BMI): 30.7 Blood  Pressure (mmHg): 166/73 Reference Range: 80 - 120 mg / dl Electronic Signature(s) Signed: 06/13/2020 5:52:00 PM By: [name] Entered By: [name] on 06/13/2020 12:46:15

## 2020-06-15 ENCOUNTER — Other Ambulatory Visit: Payer: Self-pay | Admitting: Internal Medicine

## 2020-06-15 ENCOUNTER — Telehealth: Payer: Self-pay | Admitting: Internal Medicine

## 2020-06-15 NOTE — Telephone Encounter (Signed)
Faxed signed orders to [facility] [phone], phone [phone]   orders MSW to evaluate and train in Resources 31M to

## 2020-06-15 NOTE — Telephone Encounter (Signed)
Faxed signed orders to [facility] [phone], phone [phone]  Pt also to have wound care clinic appt follow up as well

## 2020-06-19 DIAGNOSIS — S32592D Other specified fracture of left pubis, subsequent encounter for fracture with routine healing: Secondary | ICD-10-CM | POA: Diagnosis not present

## 2020-06-19 DIAGNOSIS — S32402D Unspecified fracture of left acetabulum, subsequent encounter for fracture with routine healing: Secondary | ICD-10-CM | POA: Diagnosis not present

## 2020-06-21 ENCOUNTER — Telehealth: Payer: Self-pay | Admitting: Internal Medicine

## 2020-06-21 NOTE — Telephone Encounter (Signed)
Faxed signed orders to [facility] [phone], phone [phone]  Order # [id number]

## 2020-06-21 NOTE — Telephone Encounter (Signed)
Faxed Re Certified signed [facility] [phone], Phone [phone]  Order # [id number]  [place] 4/75/8307 to 4/60/0298

## 2020-06-25 ENCOUNTER — Encounter (HOSPITAL_BASED_OUTPATIENT_CLINIC_OR_DEPARTMENT_OTHER): Payer: Medicare PPO | Admitting: Internal Medicine

## 2020-06-25 DIAGNOSIS — S32502D Unspecified fracture of left pubis, subsequent encounter for fracture with routine healing: Secondary | ICD-10-CM | POA: Diagnosis not present

## 2020-06-25 DIAGNOSIS — R2681 Unsteadiness on feet: Secondary | ICD-10-CM | POA: Diagnosis not present

## 2020-06-25 DIAGNOSIS — M6281 Muscle weakness (generalized): Secondary | ICD-10-CM | POA: Diagnosis not present

## 2020-06-25 DIAGNOSIS — Z9181 History of falling: Secondary | ICD-10-CM | POA: Diagnosis not present

## 2020-06-25 DIAGNOSIS — M545 Low back pain, unspecified: Secondary | ICD-10-CM | POA: Diagnosis not present

## 2020-06-25 DIAGNOSIS — S32412A Displaced fracture of anterior wall of left acetabulum, initial encounter for closed fracture: Secondary | ICD-10-CM | POA: Diagnosis not present

## 2020-06-26 DIAGNOSIS — D649 Anemia, unspecified: Secondary | ICD-10-CM | POA: Diagnosis not present

## 2020-06-26 DIAGNOSIS — I700 Atherosclerosis of aorta: Secondary | ICD-10-CM | POA: Diagnosis not present

## 2020-06-26 DIAGNOSIS — L97811 Non-pressure chronic ulcer of other part of right lower leg limited to breakdown of skin: Secondary | ICD-10-CM | POA: Diagnosis not present

## 2020-06-26 DIAGNOSIS — I10 Essential (primary) hypertension: Secondary | ICD-10-CM | POA: Diagnosis not present

## 2020-06-26 DIAGNOSIS — I872 Venous insufficiency (chronic) (peripheral): Secondary | ICD-10-CM | POA: Diagnosis not present

## 2020-06-26 DIAGNOSIS — L97821 Non-pressure chronic ulcer of other part of left lower leg limited to breakdown of skin: Secondary | ICD-10-CM | POA: Diagnosis not present

## 2020-06-26 DIAGNOSIS — S3289XD Fracture of other parts of pelvis, subsequent encounter for fracture with routine healing: Secondary | ICD-10-CM | POA: Diagnosis not present

## 2020-06-26 DIAGNOSIS — S32592D Other specified fracture of left pubis, subsequent encounter for fracture with routine healing: Secondary | ICD-10-CM | POA: Diagnosis not present

## 2020-06-26 DIAGNOSIS — S32492D Other specified fracture of left acetabulum, subsequent encounter for fracture with routine healing: Secondary | ICD-10-CM | POA: Diagnosis not present

## 2020-06-27 ENCOUNTER — Telehealth: Payer: Self-pay | Admitting: Internal Medicine

## 2020-06-27 ENCOUNTER — Other Ambulatory Visit: Payer: Self-pay | Admitting: Internal Medicine

## 2020-06-27 DIAGNOSIS — L97811 Non-pressure chronic ulcer of other part of right lower leg limited to breakdown of skin: Secondary | ICD-10-CM | POA: Diagnosis not present

## 2020-06-27 DIAGNOSIS — S32492D Other specified fracture of left acetabulum, subsequent encounter for fracture with routine healing: Secondary | ICD-10-CM | POA: Diagnosis not present

## 2020-06-27 DIAGNOSIS — S3289XD Fracture of other parts of pelvis, subsequent encounter for fracture with routine healing: Secondary | ICD-10-CM | POA: Diagnosis not present

## 2020-06-27 DIAGNOSIS — L97821 Non-pressure chronic ulcer of other part of left lower leg limited to breakdown of skin: Secondary | ICD-10-CM | POA: Diagnosis not present

## 2020-06-27 DIAGNOSIS — I10 Essential (primary) hypertension: Secondary | ICD-10-CM | POA: Diagnosis not present

## 2020-06-27 DIAGNOSIS — I872 Venous insufficiency (chronic) (peripheral): Secondary | ICD-10-CM | POA: Diagnosis not present

## 2020-06-27 DIAGNOSIS — S32592D Other specified fracture of left pubis, subsequent encounter for fracture with routine healing: Secondary | ICD-10-CM | POA: Diagnosis not present

## 2020-06-27 DIAGNOSIS — D649 Anemia, unspecified: Secondary | ICD-10-CM | POA: Diagnosis not present

## 2020-06-27 DIAGNOSIS — I700 Atherosclerosis of aorta: Secondary | ICD-10-CM | POA: Diagnosis not present

## 2020-06-27 NOTE — Telephone Encounter (Signed)
He said he is not taking anything else for pain only tramadol and he takes one in the morning and one at bedtime.

## 2020-06-27 NOTE — Telephone Encounter (Signed)
Faxed Signed Certification and Plan of Care for Nursing Order to [facility] [place] [phone] phone [phone]  Order # [id number]  05/29/2020 to 07/27/2020

## 2020-06-28 DIAGNOSIS — L97821 Non-pressure chronic ulcer of other part of left lower leg limited to breakdown of skin: Secondary | ICD-10-CM | POA: Diagnosis not present

## 2020-06-28 DIAGNOSIS — I700 Atherosclerosis of aorta: Secondary | ICD-10-CM | POA: Diagnosis not present

## 2020-06-28 DIAGNOSIS — D649 Anemia, unspecified: Secondary | ICD-10-CM | POA: Diagnosis not present

## 2020-06-28 DIAGNOSIS — S32492D Other specified fracture of left acetabulum, subsequent encounter for fracture with routine healing: Secondary | ICD-10-CM | POA: Diagnosis not present

## 2020-06-28 DIAGNOSIS — L97811 Non-pressure chronic ulcer of other part of right lower leg limited to breakdown of skin: Secondary | ICD-10-CM | POA: Diagnosis not present

## 2020-06-28 DIAGNOSIS — I10 Essential (primary) hypertension: Secondary | ICD-10-CM | POA: Diagnosis not present

## 2020-06-28 DIAGNOSIS — S3289XD Fracture of other parts of pelvis, subsequent encounter for fracture with routine healing: Secondary | ICD-10-CM | POA: Diagnosis not present

## 2020-06-28 DIAGNOSIS — S32592D Other specified fracture of left pubis, subsequent encounter for fracture with routine healing: Secondary | ICD-10-CM | POA: Diagnosis not present

## 2020-06-28 DIAGNOSIS — I872 Venous insufficiency (chronic) (peripheral): Secondary | ICD-10-CM | POA: Diagnosis not present

## 2020-06-29 DIAGNOSIS — L97821 Non-pressure chronic ulcer of other part of left lower leg limited to breakdown of skin: Secondary | ICD-10-CM | POA: Diagnosis not present

## 2020-06-29 DIAGNOSIS — I872 Venous insufficiency (chronic) (peripheral): Secondary | ICD-10-CM | POA: Diagnosis not present

## 2020-06-29 DIAGNOSIS — I700 Atherosclerosis of aorta: Secondary | ICD-10-CM | POA: Diagnosis not present

## 2020-06-29 DIAGNOSIS — D649 Anemia, unspecified: Secondary | ICD-10-CM | POA: Diagnosis not present

## 2020-06-29 DIAGNOSIS — S32492D Other specified fracture of left acetabulum, subsequent encounter for fracture with routine healing: Secondary | ICD-10-CM | POA: Diagnosis not present

## 2020-06-29 DIAGNOSIS — I10 Essential (primary) hypertension: Secondary | ICD-10-CM | POA: Diagnosis not present

## 2020-06-29 DIAGNOSIS — S3289XD Fracture of other parts of pelvis, subsequent encounter for fracture with routine healing: Secondary | ICD-10-CM | POA: Diagnosis not present

## 2020-06-29 DIAGNOSIS — L97811 Non-pressure chronic ulcer of other part of right lower leg limited to breakdown of skin: Secondary | ICD-10-CM | POA: Diagnosis not present

## 2020-06-29 DIAGNOSIS — S32592D Other specified fracture of left pubis, subsequent encounter for fracture with routine healing: Secondary | ICD-10-CM | POA: Diagnosis not present

## 2020-07-03 DIAGNOSIS — S3289XD Fracture of other parts of pelvis, subsequent encounter for fracture with routine healing: Secondary | ICD-10-CM | POA: Diagnosis not present

## 2020-07-03 DIAGNOSIS — I872 Venous insufficiency (chronic) (peripheral): Secondary | ICD-10-CM | POA: Diagnosis not present

## 2020-07-03 DIAGNOSIS — D649 Anemia, unspecified: Secondary | ICD-10-CM | POA: Diagnosis not present

## 2020-07-03 DIAGNOSIS — L97811 Non-pressure chronic ulcer of other part of right lower leg limited to breakdown of skin: Secondary | ICD-10-CM | POA: Diagnosis not present

## 2020-07-03 DIAGNOSIS — S32492D Other specified fracture of left acetabulum, subsequent encounter for fracture with routine healing: Secondary | ICD-10-CM | POA: Diagnosis not present

## 2020-07-03 DIAGNOSIS — S32592D Other specified fracture of left pubis, subsequent encounter for fracture with routine healing: Secondary | ICD-10-CM | POA: Diagnosis not present

## 2020-07-03 DIAGNOSIS — I10 Essential (primary) hypertension: Secondary | ICD-10-CM | POA: Diagnosis not present

## 2020-07-03 DIAGNOSIS — I700 Atherosclerosis of aorta: Secondary | ICD-10-CM | POA: Diagnosis not present

## 2020-07-03 DIAGNOSIS — L97821 Non-pressure chronic ulcer of other part of left lower leg limited to breakdown of skin: Secondary | ICD-10-CM | POA: Diagnosis not present

## 2020-07-05 NOTE — Telephone Encounter (Signed)
Re faxed orders for MSW to evaluate and train in resources 1 M 1.

## 2020-07-10 DIAGNOSIS — D649 Anemia, unspecified: Secondary | ICD-10-CM | POA: Diagnosis not present

## 2020-07-10 DIAGNOSIS — I10 Essential (primary) hypertension: Secondary | ICD-10-CM | POA: Diagnosis not present

## 2020-07-10 DIAGNOSIS — S32592D Other specified fracture of left pubis, subsequent encounter for fracture with routine healing: Secondary | ICD-10-CM | POA: Diagnosis not present

## 2020-07-10 DIAGNOSIS — S3289XD Fracture of other parts of pelvis, subsequent encounter for fracture with routine healing: Secondary | ICD-10-CM | POA: Diagnosis not present

## 2020-07-10 DIAGNOSIS — L97821 Non-pressure chronic ulcer of other part of left lower leg limited to breakdown of skin: Secondary | ICD-10-CM | POA: Diagnosis not present

## 2020-07-10 DIAGNOSIS — I872 Venous insufficiency (chronic) (peripheral): Secondary | ICD-10-CM | POA: Diagnosis not present

## 2020-07-10 DIAGNOSIS — L97811 Non-pressure chronic ulcer of other part of right lower leg limited to breakdown of skin: Secondary | ICD-10-CM | POA: Diagnosis not present

## 2020-07-10 DIAGNOSIS — S32492D Other specified fracture of left acetabulum, subsequent encounter for fracture with routine healing: Secondary | ICD-10-CM | POA: Diagnosis not present

## 2020-07-10 DIAGNOSIS — I700 Atherosclerosis of aorta: Secondary | ICD-10-CM | POA: Diagnosis not present

## 2020-07-10 NOTE — Telephone Encounter (Signed)
Faxed MSW orders to [phone], they called and said they still have not received them so I faxed to different number.

## 2020-07-12 ENCOUNTER — Telehealth: Payer: Self-pay | Admitting: Internal Medicine

## 2020-07-12 DIAGNOSIS — L97811 Non-pressure chronic ulcer of other part of right lower leg limited to breakdown of skin: Secondary | ICD-10-CM | POA: Diagnosis not present

## 2020-07-12 DIAGNOSIS — I872 Venous insufficiency (chronic) (peripheral): Secondary | ICD-10-CM | POA: Diagnosis not present

## 2020-07-12 DIAGNOSIS — S32592D Other specified fracture of left pubis, subsequent encounter for fracture with routine healing: Secondary | ICD-10-CM | POA: Diagnosis not present

## 2020-07-12 DIAGNOSIS — I700 Atherosclerosis of aorta: Secondary | ICD-10-CM | POA: Diagnosis not present

## 2020-07-12 DIAGNOSIS — S3289XD Fracture of other parts of pelvis, subsequent encounter for fracture with routine healing: Secondary | ICD-10-CM | POA: Diagnosis not present

## 2020-07-12 DIAGNOSIS — D649 Anemia, unspecified: Secondary | ICD-10-CM | POA: Diagnosis not present

## 2020-07-12 DIAGNOSIS — L97821 Non-pressure chronic ulcer of other part of left lower leg limited to breakdown of skin: Secondary | ICD-10-CM | POA: Diagnosis not present

## 2020-07-12 DIAGNOSIS — S32492D Other specified fracture of left acetabulum, subsequent encounter for fracture with routine healing: Secondary | ICD-10-CM | POA: Diagnosis not present

## 2020-07-12 DIAGNOSIS — I10 Essential (primary) hypertension: Secondary | ICD-10-CM | POA: Diagnosis not present

## 2020-07-12 NOTE — Telephone Encounter (Signed)
Faxed signed Social Work orders to [employer] [facility] [phone], phone [phone] Order # [id number] Reason to order: MSW to Evaluate MSW effective 07/08/2020 1wk1

## 2020-07-12 NOTE — Telephone Encounter (Signed)
Faxed signed order to [facility] [phone], phone [phone].  Order # [id number]  Good 05/29/2020 to 07/27/2020

## 2020-07-17 DIAGNOSIS — I872 Venous insufficiency (chronic) (peripheral): Secondary | ICD-10-CM | POA: Diagnosis not present

## 2020-07-17 DIAGNOSIS — L97821 Non-pressure chronic ulcer of other part of left lower leg limited to breakdown of skin: Secondary | ICD-10-CM | POA: Diagnosis not present

## 2020-07-17 DIAGNOSIS — I700 Atherosclerosis of aorta: Secondary | ICD-10-CM | POA: Diagnosis not present

## 2020-07-17 DIAGNOSIS — I10 Essential (primary) hypertension: Secondary | ICD-10-CM | POA: Diagnosis not present

## 2020-07-17 DIAGNOSIS — S32492D Other specified fracture of left acetabulum, subsequent encounter for fracture with routine healing: Secondary | ICD-10-CM | POA: Diagnosis not present

## 2020-07-17 DIAGNOSIS — S3289XD Fracture of other parts of pelvis, subsequent encounter for fracture with routine healing: Secondary | ICD-10-CM | POA: Diagnosis not present

## 2020-07-17 DIAGNOSIS — L97811 Non-pressure chronic ulcer of other part of right lower leg limited to breakdown of skin: Secondary | ICD-10-CM | POA: Diagnosis not present

## 2020-07-17 DIAGNOSIS — D649 Anemia, unspecified: Secondary | ICD-10-CM | POA: Diagnosis not present

## 2020-07-17 DIAGNOSIS — S32592D Other specified fracture of left pubis, subsequent encounter for fracture with routine healing: Secondary | ICD-10-CM | POA: Diagnosis not present

## 2020-07-19 NOTE — Telephone Encounter (Signed)
Faxed again on 07/18/2020

## 2020-07-23 DIAGNOSIS — S32402D Unspecified fracture of left acetabulum, subsequent encounter for fracture with routine healing: Secondary | ICD-10-CM | POA: Diagnosis not present

## 2020-07-24 DIAGNOSIS — L97821 Non-pressure chronic ulcer of other part of left lower leg limited to breakdown of skin: Secondary | ICD-10-CM | POA: Diagnosis not present

## 2020-07-24 DIAGNOSIS — S32592D Other specified fracture of left pubis, subsequent encounter for fracture with routine healing: Secondary | ICD-10-CM | POA: Diagnosis not present

## 2020-07-24 DIAGNOSIS — I10 Essential (primary) hypertension: Secondary | ICD-10-CM | POA: Diagnosis not present

## 2020-07-24 DIAGNOSIS — D649 Anemia, unspecified: Secondary | ICD-10-CM | POA: Diagnosis not present

## 2020-07-24 DIAGNOSIS — S32492D Other specified fracture of left acetabulum, subsequent encounter for fracture with routine healing: Secondary | ICD-10-CM | POA: Diagnosis not present

## 2020-07-24 DIAGNOSIS — I872 Venous insufficiency (chronic) (peripheral): Secondary | ICD-10-CM | POA: Diagnosis not present

## 2020-07-24 DIAGNOSIS — S3289XD Fracture of other parts of pelvis, subsequent encounter for fracture with routine healing: Secondary | ICD-10-CM | POA: Diagnosis not present

## 2020-07-24 DIAGNOSIS — I700 Atherosclerosis of aorta: Secondary | ICD-10-CM | POA: Diagnosis not present

## 2020-07-24 DIAGNOSIS — L97811 Non-pressure chronic ulcer of other part of right lower leg limited to breakdown of skin: Secondary | ICD-10-CM | POA: Diagnosis not present

## 2020-07-25 ENCOUNTER — Encounter (HOSPITAL_BASED_OUTPATIENT_CLINIC_OR_DEPARTMENT_OTHER): Payer: Medicare PPO | Admitting: Internal Medicine

## 2020-07-25 DIAGNOSIS — R2681 Unsteadiness on feet: Secondary | ICD-10-CM | POA: Diagnosis not present

## 2020-07-25 DIAGNOSIS — M6281 Muscle weakness (generalized): Secondary | ICD-10-CM | POA: Diagnosis not present

## 2020-07-25 DIAGNOSIS — Z9181 History of falling: Secondary | ICD-10-CM | POA: Diagnosis not present

## 2020-07-25 DIAGNOSIS — S32502D Unspecified fracture of left pubis, subsequent encounter for fracture with routine healing: Secondary | ICD-10-CM | POA: Diagnosis not present

## 2020-07-25 DIAGNOSIS — M545 Low back pain, unspecified: Secondary | ICD-10-CM | POA: Diagnosis not present

## 2020-08-08 ENCOUNTER — Encounter (HOSPITAL_BASED_OUTPATIENT_CLINIC_OR_DEPARTMENT_OTHER): Payer: Medicare PPO | Attending: Physician Assistant | Admitting: Physician Assistant

## 2020-08-08 ENCOUNTER — Other Ambulatory Visit: Payer: Self-pay

## 2020-08-08 DIAGNOSIS — Q818 Other epidermolysis bullosa: Secondary | ICD-10-CM | POA: Diagnosis not present

## 2020-08-08 DIAGNOSIS — L97322 Non-pressure chronic ulcer of left ankle with fat layer exposed: Secondary | ICD-10-CM | POA: Insufficient documentation

## 2020-08-08 DIAGNOSIS — L97812 Non-pressure chronic ulcer of other part of right lower leg with fat layer exposed: Secondary | ICD-10-CM | POA: Diagnosis not present

## 2020-08-08 DIAGNOSIS — S91301A Unspecified open wound, right foot, initial encounter: Secondary | ICD-10-CM | POA: Diagnosis not present

## 2020-08-08 DIAGNOSIS — L97822 Non-pressure chronic ulcer of other part of left lower leg with fat layer exposed: Secondary | ICD-10-CM | POA: Diagnosis not present

## 2020-08-08 DIAGNOSIS — S81809A Unspecified open wound, unspecified lower leg, initial encounter: Secondary | ICD-10-CM | POA: Diagnosis not present

## 2020-08-08 DIAGNOSIS — I872 Venous insufficiency (chronic) (peripheral): Secondary | ICD-10-CM | POA: Diagnosis not present

## 2020-08-08 DIAGNOSIS — I10 Essential (primary) hypertension: Secondary | ICD-10-CM | POA: Insufficient documentation

## 2020-08-08 NOTE — Progress Notes (Addendum)
DARIOUS, GREENHAW ([id number]) Visit Report for 08/08/2020 Chief Complaint Document Details Patient Name: Date of Service: [name] 08/08/2020 10:00 A M Medical Record Number: [id number] Patient Account Number: [id number] Date of Birth/Sex: Treating RN: 1940-08-01 (80 y.o. [name] Primary Care Provider: [name] Other Clinician: Referring Provider: Treating Provider/Extender: [name] in Treatment: [phone] Information Obtained from: Patient Chief Complaint Bilateral LE Ulcers Electronic Signature(s) Signed: 08/08/2020 10:20:37 AM By: Worthy Keeler PA-C Entered By: Worthy Keeler on 08/08/2020 10:20:37 -------------------------------------------------------------------------------- HPI Details Patient Name: Date of Service: [name] RDO N H. 08/08/2020 10:00 A M Medical Record Number: [id number] Patient Account Number: [id number] Date of Birth/Sex: Treating RN: 1940-01-31 (80 y.o. [name] Primary Care Provider: [name] Other Clinician: Referring Provider: Treating Provider/Extender: [name] in Treatment: 149 History of Present Illness HPI Description: 09/30/17 on evaluation today patient presents for initial evaluation and our clinic concerning issues that he has been having with his bilateral lower extremities. He states this has been going on for quite some time at least six months. Currently his regiment has been mainly cleaning the area with peroxide, applying the is foreign ointment, and wrapping the area with ABD pads and then an ace wrap loosely. He has dealt with issues of this nature he tells me for quite some time. He does have a history of having had a compound fracture of the left lower extremity which he thinks also makes this a much more difficult area for him to heal. He's previously been told that he had poor vascular flow but this was years ago at [facility] we do not have any of those  records at this time. He has a history of Epidermolysis Bullosa which was diagnosed around age 40 and he has been cared for at [facility] since that time. Subsequently he states this is hereditary and two of his children one male and one male also have this as well is one of his grandchildren that he is aware of. He has no evidence of infection necessarily at this point although he does have some necrotic tissue noted on the surface of the wound as far as the largest, left lateral lower extremity ulcer, is concerned. Overall I feel like all things considered he's been taking care of this very well. Obviously he has some fairly significant issues going on at this point in this regard. He does have a history otherwise of hypertension though for the most part other than the compound fracture of the left leg he seems to have been fairly healthy in my pinion. 10/07/17 on evaluation today patient actually appears to be doing better in regard to his bilateral lower extremity ulcers. With that being said he does still have some evidence of slough noted on the surface of the wounds I think the Iodoflex has been beneficial for him. His arterial studies are scheduled for October 2. With that being said I do believe that he is continuing to show signs of good improvement which is at least good news. 10/14/17 on evaluation today patient appears to be doing very well in regard to his lower extremity ulcers. He's definitely made some progress as far as healing is concerned although there still are several open areas that are going to need to be addressed. He did have his arterial study today which fortunately shows good findings with a right ABI of 1.23 with a TBI of 0.86 in the  left ABI of 1.28 with a TBI of 0.81. This is good news and will allow [place] to perform debridement as well. 10/23/2017; patient with a large wound on the left lateral calf, sizable area on the left medial malleolus and an area on the right lateral  malleolus. He has a new blister consistent with his underlying blistering skin disease just above this area we have been using Iodoflex on the lateral left calf lateral right ankle and collagen on the medial left ankle. We have been using Kerlix Coban wraps 10/28/17 on evaluation today the patient continues to have signs of improvement in regard to the overall appearance of the original wound. Unfortunately he did have some blistering over the right lateral lower extremity which has appeared to rupture on evaluation today and likely some of the dead tissue on the surface needs to be cleaned away the good news is this does not appear to be to significantly deep at this time. 11/04/17 on evaluation today patient actually appears to be doing a little better in regard to his lower extremity ulcers. He has been tolerating the dressing changes without complication. With that being said he does still have a significant wound especially over the left lateral lower extremity unfortunately. All of the wounds pretty much are going to require sharp debridement today. 11/11/17 on evaluation today patient appears to be doing more poorly in regard to his left lower extremity in particular. There does not appear to be any evidence of systemic infection although the wound itself as far as the larger left lateral lower extremity ulcer actually appears to be infected in my pinion. There's an older and the surface of the wound is dramatically worsened compared to last week. No fevers, chills, nausea, or vomiting noted at this time. 11/18/17 upon evaluation today patient actually appears to be doing better. I did review his culture today which really did not show any specific organism is a positive reason for his wound decline. There are multiple organisms present not predominant. Nonetheless he seems to be tolerate the doxycycline well in his wounds in general do seem to be doing better. Fortunately there does not appear  to be any evidence of infection at this time which is good news. Overall I'm very pleased with how things appear. Nonetheless he still has a lot of healing to go. I do think he could benefit from a Juxta-Lite wrap. 11/25/17 on evaluation today patient actually appears to be doing fairly well in regard to his wounds. He is still taking the antibiotics he has a few days left. Fortunately this seems to have been excellent for him as far as getting the infection control and very happy in this regard. With that being said the patient likewise is also very pleased with how things appear at this time in comparison to where we were he's not having as much pain. 12/02/17 Seen today for follow p and management of LLE wounds. Wounds appear to show some improvement. He denies pain, fever, or chills. Completed a course of doxycycline earlier this month. Scheduled to received Juxta-Lite wrap this week. No s/s of infections. 12/09/17 on evaluation today patient actually appears to be doing a little bit better in regard to his wounds. This is obscene very slow process and unfortunately he has a couple of new areas and this is due to the Epidermolysis Bullosa. Nonetheless I am concerned about the fact that he seems to be getting more areas not less that is the reason we're [place]  work on getting schedule for the vascular referral to see the venous specialist. 12/23/17 upon evaluation today patient's wounds currently shows evidence of still not doing quite as well is what I would like to have seen. Subsequently the patient did have his venous study which showed evidence of venous stasis. Subsequently I do think that a vascular evaluation for consideration of venous intervention would be appropriate. I'm not necessarily suggesting that will be anything that can be done but I think it is at least a good idea. He is in agreement with this plan. 12/30/17 on evaluation today patient actually appears to be doing very well in  regard to his wounds when compared to previous evaluation. Subsequently we have been using the [facility] Dressing which actually appears to have done excellent on his left lateral lower extremity ulcer. The quality of the wound surface is dramatically improved. There is some slight debridement that is going to be required at a couple of locations but overall I'm extremely pleased with how things appear here. 01/07/2018; this is a patient with a primary skin disorder epidermolyis bullosa. Is a large wound on the left lateral calf and smaller wounds on the right however there is a new wound on the right mid tibia area that occurred within the compression wrap that he did not change. We have been using Hydrofera Blue. On the left he is using Hydrofera Blue and Santyl to the inferior part of the wound and changing the dressing himself. 01/15/2018; primary skin disorder epidermolysis bullosa. He has several difficult wounds including the left lateral calf, smaller wounds on the left medial calf and the right lateral calf. The major area on the left lateral calf has a smaller area inferiorly that has necrotic debris we have been using Santyl to this. The rest of the wounds we have been using Hydrofera Blue. The area on the left calf actually looks larger this week. Uncontrolled edema several small open areas above it that are superficial 01/20/18 on evaluation today patient appears to be doing better as compared to last week in regard to his wounds of the bilateral lower extremities. He tolerated the bilateral compression wrap without complication. Overall I'm very pleased with how things appear at this time. The patient likewise is very happy. 01/27/18 on evaluation today patient appears to be doing decently well in regard to his bilateral lower extremity ulcers. He's been tolerating the dressing changes without complication. One issue he had is that he did have more drainage to the left leg wrapped last  week. He states in fact he probably should come in and let us change it on Friday however he just left it in place and kept adding extra absorption with ABD pads to the external portion of the wrap. Unfortunately he does have some aspiration type breakdown nothing significant but I do believe that this was probably counterproductive in general. Nonetheless his wounds do not appear to be terrible overall. 02/03/18 on evaluation today patient appears to be doing rather well in regard to his lower extremity ulcers. He has been tolerating the dressing changes without complication. He does tell me that he had to change the wrap on the left one since we last saw him. Subsequently I do not see any evidence of infection I do feel like the food was much better controlled at this point. 02/10/18 on evaluation today patient appears to be doing rather well in regard to his ulcers. He still has significant alterations especially on the left lateral  lower extremity. Fortunately there's no signs of infection at this time. Overall I feel like he is making good progress are some areas that I'm gonna attempt some debridement today. 02/17/18 on evaluation today patient appears to be doing okay in regard to his lower Trinity ulcer. It does appear on both locations he has a little bit of drainage causing some breakdown in maceration around the wound bed's although it doesn't appear to be too bad the right is a little bit worse than left. Fortunately there's no signs of infection which is good news. No fevers, chills, nausea, or vomiting noted at this time. 03/10/18 on evaluation today patient actually appears to be doing rather poorly in regard to his bilateral lower extremity ulcers. The right in particular is draining profusely and the wound is actually enlarging which is not good. I'm concerned about both possibly infection and the fact that there's a lot of moisture which is causing breakdown as well. Unfortunately the  patient has been trying to change this at home I'm afraid he may need to change more frequently in order to see the improvement that were looking for. There's no signs of systemic infection. 03/17/18 patient actually appears to be doing significantly better at this point in regard to his bilateral lower extremity ulcers. Fortunately there's no signs of infection. That is worsening infection at least indefinitely nothing systemic. With that being said he is having a lot of drainage though not quite as much is there in his last evaluation. Overall feel like he's on the side of improvement. I think if his results back from his culture which showed that he had a positive group B strep along with abundant Pseudomonas noted on the culture. For that reason I am gonna have him continue with the linezolid as we previously have ordered for him and I did go ahead as well today and prescribe Levaquin as well in order to treat the Pseudomonas portion of the infection noted. 03/24/18 on evaluation today patient actually appears to be doing very well in regard to his lower Trinity ulcer. He's been tolerating the dressing changes without complication. Fortunately both legs show signs of less drainage in his edema is very well controlled at this point as well. Overall very pleased with how things seem to be progressing. 03/31/18 on evaluation today patient actually appears to be doing excellent in regard to his bilateral lower extremity ulcers. These are not draining nearly as significant as what they were in the past overall seem to be shown signs of excellent improvement which is great news. Fortunately there is no sign of active infection at this time I do believe that the Levaquin has done extremely well for him in this regard. The patient continues to change these at home typically every day. We may be able to slowly work towards every other day changes since the drainage seems to be slowing down quite  significantly. 04/14/18 on evaluation today patient appears to be doing well in regard to his bilateral lower extremities. Let me Hilda Blades Almost completely healed which is excellent news. Fortunately he's shown signs of improvement all other sites as well with new skin growth there's some slight hyper granular tissue but for the most part this seems to be well maintained with the [facility] Dressing. I'm very happy in this regard. 04/28/18 on evaluation today patient appears to be doing rather well in regard to his ulcers of the bilateral lower extremities all things considering. He continues to make some progress  as far as new skin growth. There still some hyper granulation noted at this point despite the use of the [facility] Dressing. This is not terrible but I think we may want to consider conclude cauterization today with silver nitrate to try to help knock some of his back as well as helping with any biofilm on the surface of the wound. 05/12/18 on evaluation today patient's wounds actually appear to be doing fairly well in regard to the bilateral lower extremities. He's been tolerating the dressing changes without complication. Fortunately there's no signs of active infection at this time which is good news. Overall very pleased with how things seem to be progressing. You select silver nitrate was beneficial for him. 05/26/18 on evaluation today patient appears to be doing better in regard to left lower extremity and a little bit worse in regard to the right lower extremity. He states that he was pulling off the [facility] Dressing peel back some of the skin making this area significantly larger than what it was previous. He's not had any issues other than this and states even that hasn't caused any pain he just seems to obviously have a much larger area on the right when compared to the previous time I saw him. No fevers, chills, nausea, or vomiting noted at this time. 06/16/18 on  evaluation today patient actually appears to be doing a little better in my pinion in regard to his lower summary ulcers. He has new skin islands that seem to be spreading which is good news. Fortunately there's no signs of active infection at this time. His biggest issue is he tells me that coming as often as he does is becoming very cost prohibitive. He wonders if we can potentially spread this out. 07/14/18 on evaluation today patient appears to be doing a little bit worse in regard to his lower from the ulcer. Unfortunately he still continues to have a significant amount of drainage I think we need to do something to try to help this more. He is still somewhat reluctant to go the route of the Wound VAC although that may be the most appropriate thing for him. No fevers, chills, nausea, or vomiting noted at this time. 08/18/2018 on evaluation today patient actually appears to be doing quite well with regard to his bilateral lower extremity ulcers. I do feel like that currently he is making great progress the care max does seem to be doing a great job at helping to control the moisture he has no maceration or skin breakdown. Again this seems to be an excellent way to go. 1 thing we may want to change is adding collagen to the base of the wound and then the care max over top he is not opposed to this. 09/15/2018 on evaluation today patient appears to be doing well with regard to his bilateral lower extremity ulcers. He is showing some signs of improvement not necessarily in size but definitely in appearance. In fact he has a lot of new skin growing throughout the wounds along the edges as well as in the central portion of the wounds on both lower extremities. Overall I am extremely pleased to see this. 10/20/2018 on evaluation today patient actually appears to be doing quite well with regard to his wounds. They are not measuring significantly smaller but he does have a lot of new epithelization noted as  compared to previous. Fortunately there is no signs of active infection at this time. No fevers, chills, nausea, vomiting, or  diarrhea. 11/17/2018 on evaluation today patient presents for reevaluation concerning his bilateral lower extremity ulcers. Fortunately there is no signs of active infection at this time today. He has been tolerating the dressing changes without complication. No fevers, chills, nausea, vomiting, or diarrhea. Unfortunately in general the patient has not made as much improvement as I would like to have seen up to this point. He has been tolerating the dressing changes without complication and he does an excellent job taking care of his wounds at home in my opinion. The biggest issue I see is that he is just not making the progress that we need to be seeing currently. I think we may want to consider having him seen at a plastic surgery appointment and he has previously seen someone in years past at [facility] in [place]. That is definitely a possibility for [place] to look into at this point. 12/29/2018 on evaluation today patient appears to be doing better in regard to the overall visual appearance of his wounds which do not appear to be as macerated. He does have a much larger skin island in the middle of the left lower extremity ulcer which is doing much better. He tells me the pain is also significantly better. With that being said overall his improvement as far as the size of the wounds is not better but again these are very irregular in change shape quite often. Fortunately there is no evidence of active infection at this time which is great news. He never heard anything from [facility] regarding the plastic surgery referral that we made to them. 01/26/2019 upon evaluation today patient appears to be doing a little better in regard to his wounds today. He has been tolerating the dressing changes again he performs these for the most part on his own. He does a great  job wrapping his legs in my opinion. Unfortunately he has not been able to get down to [facility] to see if there is anything from a plastic surgery standpoint that could be done to help with his legs simply due to the fact that his wife unfortunately sustained a compression fracture in her spine she is seeing Dr. Saintclair Halsted and subsequently is going to be having what sounds to be a kyphoplasty type procedure. With that being said that has not been scheduled yet there is still waiting on an MRI the patient is very busy in fact overly busy trying to help take care of his wife at this point. I completely understand this is more of a strain on him at this time 02/23/2019 upon evaluation today patient actually appears to be making some progress. I am actually very pleased with the overall appearance of his wounds even compared to last evaluation. He seems to be doing quite well. He is taking care of his wife unfortunately she did have a compression fracture she has had the procedure for this but still she has a slow road to recovery. For that reason he still not gone to [facility] for a second opinion in this regard. Obviously the goal there was if there was anything that can be done from a skin graft standpoint or otherwise. 03/23/2019 upon evaluation today patient continues to have issues with lower extremity ulcers. Since the beginning he has made progress but at the same time the wounds unfortunately just will not close. We have been trying to get the patient to [facility] to see a specialist there but unfortunately with the everything going on  with his wife he has not been able to make that appointment time yet he states he may be able to in the next 1-2 months but is not really sure. 04/27/2019 on evaluation today patient appears to be doing a little bit more poorly. His last evaluation. He appears to have some erythema around the edges of the wound at this point. Fortunately there is no  signs of active infection at this time which is good news. No fevers, chills, nausea, vomiting, or diarrhea. 06/08/2019 on evaluation today patient appears to be doing well with regard to his wounds. Overall they are actually measuring smaller compared to the last visit last month. We did treat him for Pseudomonas as well as methicillin-resistant Staph aureus. He was only on the treatment for MRSA however for 7 days as the Cipro was resistant and subsequently we had to place him on doxycycline. Nonetheless I am thinking that we may want to add the doxycycline and just do a month-long treatment considering the longstanding nature of his wounds and see if we get this under better control. 07/13/2019 upon evaluation today patient appears to be doing fairly well in regard to his bilateral lower extremities. There does not appear to be any signs of active infection which is good news. No fevers, chills, nausea, vomiting, or diarrhea. 08/10/2019 upon evaluation today patient appears to be doing about the same with regard to his wounds in general. Unfortunately he is not significantly better although is also not significantly worse which is great news there is no evidence of active infection at this time which is good news. He still dealing with a lot going on with his wife and therefore is not really able to go see anyone at the specialty clinic at [facility] that we have previously set up still. 09/07/2019 on evaluation today patient appears to be doing well with regard to his wounds. In fact this is probably the best that have seen so far in quite a few months. Overall I am very pleased with where things stand at this time. No fevers, chills, nausea, vomiting, or diarrhea. 10/05/2019 upon evaluation today patient appears to be doing more poorly in regard to his legs at this point. He actually is showing some signs of infection. This has been something that we seem to be back-and-forth with as far as trying to keep  these wounds from becoming infected. He takes care of them very well in my opinion but nonetheless I am concerned in this regard. He tells me that his wife is still doing really about the same she is slowly getting better. Nonetheless he still spends the majority of his time helping to take care of her. 11/02/2019 upon evaluation today patient actually appears to be doing somewhat better in regard to his legs. I do believe that the compounded antibiotic treatment from [facility] has been beneficial for him. Overall I am extremely pleased with where things stand today. There is no signs of active infection at this time. The patient states he has much less drainage than he has in the past. 12/07/2019 upon evaluation today patient appears to be doing well at this time in regard to the overall appearance of his wound bed. Currently there is no signs of active infection at this time. 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  Especially when he stressed. Fortunately there is no signs of active infection locally or systemically at this point. 01/18/2020 on evaluation today patient appears to be doing well with regard to his leg ulcers. He has been tolerating the dressing changes without complication. Fortunately there is no signs of infection and overall I feel like his legs are doing about the best they have done in quite some time. There does not appear to be any evidence of active infection which is great news and overall very pleased. 02/29/2020 upon evaluation today patient's wounds actually appear to be doing quite well currently. There is no sign of active infection at this time. No fevers, chills, nausea, vomiting, or diarrhea. 04/11/2025 on evaluation today patient appears to be doing excellent in regard to his wounds on the legs to be  honest. He has made a lot of progress there does not appear to be any signs of active infection and overall I think that he is better than previous although still the wounds are quite significant obviously. In general I think that he is pleased with where things stand at this point. Still there is quite a bit of work to do here. 07/13/2020 upon evaluation today patient appears to be doing well with regard to his wound. Is been tolerating the dressing changes without complication on both legs. He just has the 2 main wounds on each leg at the ankle on the medial region of the right leg is completely healed. His wounds do seem to have gotten better during the time that he has been on bedrest as result of the pelvis fracture. Obviously does not the way that we wanted to see things improved but nonetheless he tells me that it is what it is. Fortunately he does seem to be doing better he tells me that her daughter from the [employer] is actually down helping to take care of them out as he and his wife while he is recovering. 08/08/2020 upon evaluation today patient appears to be doing well currently in regard to his leg ulcers. He has been tolerating the dressing changes without complication. Fortunately there is no signs of active infection at this time. No fevers, chills, nausea, vomiting, or diarrhea. Electronic Signature(s) Signed: 08/08/2020 11:03:55 AM By: Worthy Keeler PA-C Entered By: Worthy Keeler on 08/08/2020 11:03:54 -------------------------------------------------------------------------------- Physical Exam Details Patient Name: Date of Service: [name] 08/08/2020 10:00 A M Medical Record Number: [id number] Patient Account Number: [id number] Date of Birth/Sex: Treating RN: 1940/03/11 (80 y.o. [name] Primary Care Provider: [name] Other Clinician: Referring Provider: Treating Provider/Extender: [name] in Treatment:  54 Constitutional Well-nourished and well-hydrated in no acute distress. Respiratory normal breathing without difficulty. Psychiatric this patient is able to make decisions and demonstrates good insight into disease process. Alert and Oriented x 3. pleasant and cooperative. Notes Upon inspection patient's wound bed actually showed signs of good granulation epithelization at this point. I do not see any signs of infection which is great news and overall I am extremely pleased with where he stands currently. Overall he has been changing his own dressings and this seems to be doing a great job. Electronic Signature(s) Signed: 08/08/2020 11:04:18 AM By: Worthy Keeler PA-C Entered By: Worthy Keeler on 08/08/2020 11:04:18 -------------------------------------------------------------------------------- Physician Orders Details Patient Name: Date of Service: [name] 08/08/2020 10:00 A M Medical Record Number: [id number] Patient Account Number: [id number] Date of Birth/Sex: Treating RN:  04-11-1940 (80 y.o. 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 - on days that dressing is changed Edema Control - Lymphedema / SCD / Other Bilateral Lower Extremities Elevate legs to the level  of the heart or above for 30 minutes daily and/or when sitting, a frequency of: - throughout the day Avoid standing for long periods of time. Exercise regularly Moisturize legs daily. Compression stocking or Garment 20-30 mm/Hg pressure to: - Juxtalite to both legs daily Wound Treatment Wound #18 - Lower Leg Wound Laterality: Right, Lateral, Proximal Cleanser: Soap and Water 3 x Per Week/30 Days Discharge Instructions: May shower and wash wound with dial antibacterial soap and water prior to dressing change. Peri-Wound Care: Zinc Oxide Ointment 30g tube 3 x Per Week/30 Days Discharge Instructions: Apply Zinc Oxide to periwound with each dressing change as needed for maceration Peri-Wound Care: Sween Lotion (Moisturizing lotion) 3 x Per Week/30 Days Discharge Instructions: Apply moisturizing lotion as directed Prim Dressing: Fibracol Plus Dressing, 4x4.38 in (collagen) (Generic) 3 x Per Week/30 Days ary Discharge Instructions: Moisten collagen with saline or hydrogel Secondary Dressing: ABD Pad, 5x9 (Generic) 3 x Per Week/30 Days Discharge Instructions: Apply over primary dressing as directed. Secondary Dressing: Zetuvit Plus 4x8 in 3 x Per Week/30 Days Discharge Instructions: Apply over primary dressing as directed. Secondary Dressing: Xtrasorb Classic Super Absorbent Dressing, 6x9 (in/in) (Generic) 3 x Per Week/30 Days Discharge Instructions: Apply over primary dressing as directed. Secured With: Elastic Bandage 4 inch (ACE bandage) 3 x Per Week/30 Days Discharge Instructions: Secure with ACE bandage as directed. Secured With: [employer], 4.5x3.1 (in/yd) (Generic) 3 x Per Week/30 Days Discharge Instructions: Secure with Kerlix as directed. Wound #2 - Lower Leg Wound Laterality: Left, Lateral, Distal Cleanser: Soap and Water 3 x Per Week/30 Days Discharge Instructions: May shower and wash wound with dial antibacterial soap and water prior to dressing change. Peri-Wound Care:  Zinc Oxide Ointment 30g tube 3 x Per Week/30 Days Discharge Instructions: Apply Zinc Oxide to periwound with each dressing change as needed for maceration Peri-Wound Care: Sween Lotion (Moisturizing lotion) 3 x Per Week/30 Days Discharge Instructions: Apply moisturizing lotion as directed Prim Dressing: Fibracol Plus Dressing, 4x4.38 in (collagen) (DME) (Dispense As Written) 3 x Per Week/30 Days ary Discharge Instructions: Moisten collagen with saline or hydrogel Secondary Dressing: ABD Pad, 5x9 (DME) (Generic) 3 x Per Week/30 Days Discharge Instructions: Apply over primary dressing as directed. Secondary Dressing: Zetuvit Plus 4x8 in 3 x Per Week/30 Days Discharge Instructions: Apply over primary dressing as directed. Secondary Dressing: Xtrasorb Classic Super Absorbent Dressing, 6x9 (in/in) (DME) (Dispense As Written) 3 x Per Week/30 Days Discharge Instructions: Apply over primary dressing as directed. Secured With: Elastic Bandage 4 inch (ACE bandage) 3 x Per Week/30 Days Discharge Instructions: Secure with ACE bandage as directed. Secured With: [employer], 4.5x3.1 (in/yd) (DME) (Generic) 3 x Per Week/30 Days Discharge Instructions: Secure  with Kerlix as directed. Wound #5 - Lower Leg Wound Laterality: Right, Lateral Cleanser: Soap and Water 3 x Per Week/30 Days Discharge Instructions: May shower and wash wound with dial antibacterial soap and water prior to dressing change. Peri-Wound Care: Zinc Oxide Ointment 30g tube 3 x Per Week/30 Days Discharge Instructions: Apply Zinc Oxide to periwound with each dressing change as needed for maceration Peri-Wound Care: Sween Lotion (Moisturizing lotion) 3 x Per Week/30 Days Discharge Instructions: Apply moisturizing lotion as directed Prim Dressing: Fibracol Plus Dressing, 4x4.38 in (collagen) (DME) (Dispense As Written) 3 x Per Week/30 Days ary Discharge Instructions: Moisten collagen with saline or hydrogel Secondary Dressing: ABD  Pad, 5x9 (DME) (Generic) 3 x Per Week/30 Days Discharge Instructions: Apply over primary dressing as directed. Secondary Dressing: Zetuvit Plus 4x8 in 3 x Per Week/30 Days Discharge Instructions: Apply over primary dressing as directed. Secondary Dressing: Xtrasorb Classic Super Absorbent Dressing, 6x9 (in/in) (DME) (Dispense As Written) 3 x Per Week/30 Days Discharge Instructions: Apply over primary dressing as directed. Secured With: Elastic Bandage 4 inch (ACE bandage) 3 x Per Week/30 Days Discharge Instructions: Secure with ACE bandage as directed. Secured With: [employer], 4.5x3.1 (in/yd) (DME) (Generic) 3 x Per Week/30 Days Discharge Instructions: Secure with Kerlix as directed. Electronic Signature(s) Signed: 08/08/2020 4:30:28 PM By: Worthy Keeler PA-C Signed: 08/08/2020 5:32:13 PM By: Baruch Gouty RN, BSN Entered By: Baruch Gouty on 08/08/2020 10:49:33 -------------------------------------------------------------------------------- Problem List Details Patient Name: Date of Service: [name], GO RDO N H. 08/08/2020 10:00 A M Medical Record Number: [id number] Patient Account Number: [id number] Date of Birth/Sex: Treating RN: 1940/06/29 (80 y.o. 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 09/30/2017 No Yes L97.322 Non-pressure chronic ulcer of left ankle with fat layer exposed 09/30/2017 No Yes L97.822 Non-pressure chronic ulcer of other part of left lower leg with fat layer exposed9/18/2019 No Yes L97.812 Non-pressure chronic ulcer of other part of right lower leg with fat layer 09/30/2017 No Yes exposed I10 Essential (primary) hypertension 09/30/2017 No  Yes Inactive Problems Resolved Problems Electronic Signature(s) Signed: 08/08/2020 10:20:30 AM By: Worthy Keeler PA-C Entered By: Worthy Keeler on 08/08/2020 10:20:29 -------------------------------------------------------------------------------- Progress Note Details Patient Name: Date of Service: [name] RDO N H. 08/08/2020 10:00 A M Medical Record Number: [id number] Patient Account Number: [id number] Date of Birth/Sex: Treating RN: 04-Nov-1940 (80 y.o. [name] Primary Care Provider: [name] Other Clinician: Referring Provider: Treating Provider/Extender: [name] in Treatment: [phone] Subjective Chief Complaint Information obtained from Patient Bilateral LE Ulcers History of Present Illness (HPI) 09/30/17 on evaluation today patient presents for initial evaluation and our clinic concerning issues that he has been having with his bilateral lower extremities. He states this has been going on for quite some time at least six months. Currently his regiment has been mainly cleaning the area with peroxide, applying the is foreign ointment, and wrapping the area with ABD pads and then an ace wrap loosely. He has dealt with issues of this nature he tells me for quite some time. He does have a history of having had a compound fracture of the left lower extremity which he thinks also makes this a much more difficult area for him to heal. He's previously been told that  he had poor vascular flow but this was years ago at [facility] we do not have any of those records at this time. He has a history of Epidermolysis Bullosa which was diagnosed around age 7 and he has been cared for at [facility] since that time. Subsequently he states this is hereditary and two of his children one male and one male also have this as well is one of his grandchildren that he is aware of. He has no evidence of infection necessarily at this point although he does have some necrotic  tissue noted on the surface of the wound as far as the largest, left lateral lower extremity ulcer, is concerned. Overall I feel like all things considered he's been taking care of this very well. Obviously he has some fairly significant issues going on at this point in this regard. He does have a history otherwise of hypertension though for the most part other than the compound fracture of the left leg he seems to have been fairly healthy in my pinion. 10/07/17 on evaluation today patient actually appears to be doing better in regard to his bilateral lower extremity ulcers. With that being said he does still have some evidence of slough noted on the surface of the wounds I think the Iodoflex has been beneficial for him. His arterial studies are scheduled for October 2. With that being said I do believe that he is continuing to show signs of good improvement which is at least good news. 10/14/17 on evaluation today patient appears to be doing very well in regard to his lower extremity ulcers. He's definitely made some progress as far as healing is concerned although there still are several open areas that are going to need to be addressed. He did have his arterial study today which fortunately shows good findings with a right ABI of 1.23 with a TBI of 0.86 in the left ABI of 1.28 with a TBI of 0.81. This is good news and will allow [place] to perform debridement as well. 10/23/2017; patient with a large wound on the left lateral calf, sizable area on the left medial malleolus and an area on the right lateral malleolus. He has a new blister consistent with his underlying blistering skin disease just above this area we have been using Iodoflex on the lateral left calf lateral right ankle and collagen on the medial left ankle. We have been using Kerlix Coban wraps 10/28/17 on evaluation today the patient continues to have signs of improvement in regard to the overall appearance of the original wound.  Unfortunately he did have some blistering over the right lateral lower extremity which has appeared to rupture on evaluation today and likely some of the dead tissue on the surface needs to be cleaned away the good news is this does not appear to be to significantly deep at this time. 11/04/17 on evaluation today patient actually appears to be doing a little better in regard to his lower extremity ulcers. He has been tolerating the dressing changes without complication. With that being said he does still have a significant wound especially over the left lateral lower extremity unfortunately. All of the wounds pretty much are going to require sharp debridement today. 11/11/17 on evaluation today patient appears to be doing more poorly in regard to his left lower extremity in particular. There does not appear to be any evidence of systemic infection although the wound itself as far as the larger left lateral lower extremity ulcer actually appears to be infected  in my pinion. There's an older and the surface of the wound is dramatically worsened compared to last week. No fevers, chills, nausea, or vomiting noted at this time. 11/18/17 upon evaluation today patient actually appears to be doing better. I did review his culture today which really did not show any specific organism is a positive reason for his wound decline. There are multiple organisms present not predominant. Nonetheless he seems to be tolerate the doxycycline well in his wounds in general do seem to be doing better. Fortunately there does not appear to be any evidence of infection at this time which is good news. Overall I'm very pleased with how things appear. Nonetheless he still has a lot of healing to go. I do think he could benefit from a Juxta-Lite wrap. 11/25/17 on evaluation today patient actually appears to be doing fairly well in regard to his wounds. He is still taking the antibiotics he has a few days left. Fortunately this  seems to have been excellent for him as far as getting the infection control and very happy in this regard. With that being said the patient likewise is also very pleased with how things appear at this time in comparison to where we were he's not having as much pain. 12/02/17 Seen today for follow p and management of LLE wounds. Wounds appear to show some improvement. He denies pain, fever, or chills. Completed a course of doxycycline earlier this month. Scheduled to received Juxta-Lite wrap this week. No s/s of infections. 12/09/17 on evaluation today patient actually appears to be doing a little bit better in regard to his wounds. This is obscene very slow process and unfortunately he has a couple of new areas and this is due to the Epidermolysis Bullosa. Nonetheless I am concerned about the fact that he seems to be getting more areas not less that is the reason we're gonna work on getting schedule for the vascular referral to see the venous specialist. 12/23/17 upon evaluation today patient's wounds currently shows evidence of still not doing quite as well is what I would like to have seen. Subsequently the patient did have his venous study which showed evidence of venous stasis. Subsequently I do think that a vascular evaluation for consideration of venous intervention would be appropriate. I'm not necessarily suggesting that will be anything that can be done but I think it is at least a good idea. He is in agreement with this plan. 12/30/17 on evaluation today patient actually appears to be doing very well in regard to his wounds when compared to previous evaluation. Subsequently we have been using the [facility] Dressing which actually appears to have done excellent on his left lateral lower extremity ulcer. The quality of the wound surface is dramatically improved. There is some slight debridement that is going to be required at a couple of locations but overall I'm extremely pleased  with how things appear here. 01/07/2018; this is a patient with a primary skin disorder epidermolyis bullosa. Is a large wound on the left lateral calf and smaller wounds on the right however there is a new wound on the right mid tibia area that occurred within the compression wrap that he did not change. We have been using Hydrofera Blue. On the left he is using Hydrofera Blue and Santyl to the inferior part of the wound and changing the dressing himself. 01/15/2018; primary skin disorder epidermolysis bullosa. He has several difficult wounds including the left lateral calf, smaller wounds on the left  medial calf and the right lateral calf. The major area on the left lateral calf has a smaller area inferiorly that has necrotic debris we have been using Santyl to this. The rest of the wounds we have been using Hydrofera Blue. The area on the left calf actually looks larger this week. Uncontrolled edema several small open areas above it that are superficial 01/20/18 on evaluation today patient appears to be doing better as compared to last week in regard to his wounds of the bilateral lower extremities. He tolerated the bilateral compression wrap without complication. Overall I'm very pleased with how things appear at this time. The patient likewise is very happy. 01/27/18 on evaluation today patient appears to be doing decently well in regard to his bilateral lower extremity ulcers. He's been tolerating the dressing changes without complication. One issue he had is that he did have more drainage to the left leg wrapped last week. He states in fact he probably should come in and let us change it on Friday however he just left it in place and kept adding extra absorption with ABD pads to the external portion of the wrap. Unfortunately he does have some aspiration type breakdown nothing significant but I do believe that this was probably counterproductive in general. Nonetheless his wounds do not appear to be  terrible overall. 02/03/18 on evaluation today patient appears to be doing rather well in regard to his lower extremity ulcers. He has been tolerating the dressing changes without complication. He does tell me that he had to change the wrap on the left one since we last saw him. Subsequently I do not see any evidence of infection I do feel like the food was much better controlled at this point. 02/10/18 on evaluation today patient appears to be doing rather well in regard to his ulcers. He still has significant alterations especially on the left lateral lower extremity. Fortunately there's no signs of infection at this time. Overall I feel like he is making good progress are some areas that I'm gonna attempt some debridement today. 02/17/18 on evaluation today patient appears to be doing okay in regard to his lower Trinity ulcer. It does appear on both locations he has a little bit of drainage causing some breakdown in maceration around the wound bed's although it doesn't appear to be too bad the right is a little bit worse than left. Fortunately there's no signs of infection which is good news. No fevers, chills, nausea, or vomiting noted at this time. 03/10/18 on evaluation today patient actually appears to be doing rather poorly in regard to his bilateral lower extremity ulcers. The right in particular is draining profusely and the wound is actually enlarging which is not good. I'm concerned about both possibly infection and the fact that there's a lot of moisture which is causing breakdown as well. Unfortunately the patient has been trying to change this at home I'm afraid he may need to change more frequently in order to see the improvement that were looking for. There's no signs of systemic infection. 03/17/18 patient actually appears to be doing significantly better at this point in regard to his bilateral lower extremity ulcers. Fortunately there's no signs of infection. That is worsening infection  at least indefinitely nothing systemic. With that being said he is having a lot of drainage though not quite as much is there in his last evaluation. Overall feel like he's on the side of improvement. I think if his results back from his culture  which showed that he had a positive group B strep along with abundant Pseudomonas noted on the culture. For that reason I am gonna have him continue with the linezolid as we previously have ordered for him and I did go ahead as well today and prescribe Levaquin as well in order to treat the Pseudomonas portion of the infection noted. 03/24/18 on evaluation today patient actually appears to be doing very well in regard to his lower Trinity ulcer. He's been tolerating the dressing changes without complication. Fortunately both legs show signs of less drainage in his edema is very well controlled at this point as well. Overall very pleased with how things seem to be progressing. 03/31/18 on evaluation today patient actually appears to be doing excellent in regard to his bilateral lower extremity ulcers. These are not draining nearly as significant as what they were in the past overall seem to be shown signs of excellent improvement which is great news. Fortunately there is no sign of active infection at this time I do believe that the Levaquin has done extremely well for him in this regard. The patient continues to change these at home typically every day. We may be able to slowly work towards every other day changes since the drainage seems to be slowing down quite significantly. 04/14/18 on evaluation today patient appears to be doing well in regard to his bilateral lower extremities. Let me [facility] Almost completely healed which is excellent news. Fortunately he's shown signs of improvement all other sites as well with new skin growth there's some slight hyper granular tissue but for the most part this seems to be well maintained with the [facility]  Dressing. I'm very happy in this regard. 04/28/18 on evaluation today patient appears to be doing rather well in regard to his ulcers of the bilateral lower extremities all things considering. He continues to make some progress as far as new skin growth. There still some hyper granulation noted at this point despite the use of the [facility] Dressing. This is not terrible but I think we may want to consider conclude cauterization today with silver nitrate to try to help knock some of his back as well as helping with any biofilm on the surface of the wound. 05/12/18 on evaluation today patient's wounds actually appear to be doing fairly well in regard to the bilateral lower extremities. He's been tolerating the dressing changes without complication. Fortunately there's no signs of active infection at this time which is good news. Overall very pleased with how things seem to be progressing. You select silver nitrate was beneficial for him. 05/26/18 on evaluation today patient appears to be doing better in regard to left lower extremity and a little bit worse in regard to the right lower extremity. He states that he was pulling off the [facility] Dressing peel back some of the skin making this area significantly larger than what it was previous. He's not had any issues other than this and states even that hasn't caused any pain he just seems to obviously have a much larger area on the right when compared to the previous time I saw him. No fevers, chills, nausea, or vomiting noted at this time. 06/16/18 on evaluation today patient actually appears to be doing a little better in my pinion in regard to his lower summary ulcers. He has new skin islands that seem to be spreading which is good news. Fortunately there's no signs of active infection at this time. His biggest issue  is he tells me that coming as often as he does is becoming very cost prohibitive. He wonders if we can potentially spread this  out. 07/14/18 on evaluation today patient appears to be doing a little bit worse in regard to his lower from the ulcer. Unfortunately he still continues to have a significant amount of drainage I think we need to do something to try to help this more. He is still somewhat reluctant to go the route of the Wound VAC although that may be the most appropriate thing for him. No fevers, chills, nausea, or vomiting noted at this time. 08/18/2018 on evaluation today patient actually appears to be doing quite well with regard to his bilateral lower extremity ulcers. I do feel like that currently he is making great progress the care max does seem to be doing a great job at helping to control the moisture he has no maceration or skin breakdown. Again this seems to be an excellent way to go. 1 thing we may want to change is adding collagen to the base of the wound and then the care max over top he is not opposed to this. 09/15/2018 on evaluation today patient appears to be doing well with regard to his bilateral lower extremity ulcers. He is showing some signs of improvement not necessarily in size but definitely in appearance. In fact he has a lot of new skin growing throughout the wounds along the edges as well as in the central portion of the wounds on both lower extremities. Overall I am extremely pleased to see this. 10/20/2018 on evaluation today patient actually appears to be doing quite well with regard to his wounds. They are not measuring significantly smaller but he does have a lot of new epithelization noted as compared to previous. Fortunately there is no signs of active infection at this time. No fevers, chills, nausea, vomiting, or diarrhea. 11/17/2018 on evaluation today patient presents for reevaluation concerning his bilateral lower extremity ulcers. Fortunately there is no signs of active infection at this time today. He has been tolerating the dressing changes without complication. No fevers, chills,  nausea, vomiting, or diarrhea. Unfortunately in general the patient has not made as much improvement as I would like to have seen up to this point. He has been tolerating the dressing changes without complication and he does an excellent job taking care of his wounds at home in my opinion. The biggest issue I see is that he is just not making the progress that we need to be seeing currently. I think we may want to consider having him seen at a plastic surgery appointment and he has previously seen someone in years past at [facility] in [place]. That is definitely a possibility for [place] to look into at this point. 12/29/2018 on evaluation today patient appears to be doing better in regard to the overall visual appearance of his wounds which do not appear to be as macerated. He does have a much larger skin island in the middle of the left lower extremity ulcer which is doing much better. He tells me the pain is also significantly better. With that being said overall his improvement as far as the size of the wounds is not better but again these are very irregular in change shape quite often. Fortunately there is no evidence of active infection at this time which is great news. He never heard anything from [facility] regarding the plastic surgery referral that we made to them. 01/26/2019 upon  evaluation today patient appears to be doing a little better in regard to his wounds today. He has been tolerating the dressing changes again he performs these for the most part on his own. He does a great job wrapping his legs in my opinion. Unfortunately he has not been able to get down to [facility] to see if there is anything from a plastic surgery standpoint that could be done to help with his legs simply due to the fact that his wife unfortunately sustained a compression fracture in her spine she is seeing Dr. Saintclair Halsted and subsequently is going to be having what sounds to be a kyphoplasty type procedure.  With that being said that has not been scheduled yet there is still waiting on an MRI the patient is very busy in fact overly busy trying to help take care of his wife at this point. I completely understand this is more of a strain on him at this time 02/23/2019 upon evaluation today patient actually appears to be making some progress. I am actually very pleased with the overall appearance of his wounds even compared to last evaluation. He seems to be doing quite well. He is taking care of his wife unfortunately she did have a compression fracture she has had the procedure for this but still she has a slow road to recovery. For that reason he still not gone to [facility] for a second opinion in this regard. Obviously the goal there was if there was anything that can be done from a skin graft standpoint or otherwise. 03/23/2019 upon evaluation today patient continues to have issues with lower extremity ulcers. Since the beginning he has made progress but at the same time the wounds unfortunately just will not close. We have been trying to get the patient to [facility] to see a specialist there but unfortunately with the everything going on with his wife he has not been able to make that appointment time yet he states he may be able to in the next 1-2 months but is not really sure. 04/27/2019 on evaluation today patient appears to be doing a little bit more poorly. His last evaluation. He appears to have some erythema around the edges of the wound at this point. Fortunately there is no signs of active infection at this time which is good news. No fevers, chills, nausea, vomiting, or diarrhea. 06/08/2019 on evaluation today patient appears to be doing well with regard to his wounds. Overall they are actually measuring smaller compared to the last visit last month. We did treat him for Pseudomonas as well as methicillin-resistant Staph aureus. He was only on the treatment for MRSA however  for 7 days as the Cipro was resistant and subsequently we had to place him on doxycycline. Nonetheless I am thinking that we may want to add the doxycycline and just do a month-long treatment considering the longstanding nature of his wounds and see if we get this under better control. 07/13/2019 upon evaluation today patient appears to be doing fairly well in regard to his bilateral lower extremities. There does not appear to be any signs of active infection which is good news. No fevers, chills, nausea, vomiting, or diarrhea. 08/10/2019 upon evaluation today patient appears to be doing about the same with regard to his wounds in general. Unfortunately he is not significantly better although is also not significantly worse which is great news there is no evidence of active infection at this time which is good news. He  still dealing with a lot going on with his wife and therefore is not really able to go see anyone at the specialty clinic at [facility] that we have previously set up still. 09/07/2019 on evaluation today patient appears to be doing well with regard to his wounds. In fact this is probably the best that have seen so far in quite a few months. Overall I am very pleased with where things stand at this time. No fevers, chills, nausea, vomiting, or diarrhea. 10/05/2019 upon evaluation today patient appears to be doing more poorly in regard to his legs at this point. He actually is showing some signs of infection. This has been something that we seem to be back-and-forth with as far as trying to keep these wounds from becoming infected. He takes care of them very well in my opinion but nonetheless I am concerned in this regard. He tells me that his wife is still doing really about the same she is slowly getting better. Nonetheless he still spends the majority of his time helping to take care of her. 11/02/2019 upon evaluation today patient actually appears to be doing somewhat better in regard to his  legs. I do believe that the compounded antibiotic treatment from [facility] has been beneficial for him. Overall I am extremely pleased with where things stand today. There is no signs of active infection at this time. The patient states he has much less drainage than he has in the past. 12/07/2019 upon evaluation today patient appears to be doing well at this time in regard to the overall appearance of his wound bed. Currently there is no signs of active infection at this time. 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 Especially when he stressed. Fortunately there is no signs of active infection locally or systemically at this point. 01/18/2020 on evaluation today patient appears to be doing well with regard to his leg ulcers. He has been tolerating the dressing changes without complication. Fortunately there is no signs of infection and overall I feel like his legs are doing about the best they have done in quite some time. There does not appear to be any evidence of active infection which is great news and overall very pleased. 02/29/2020 upon evaluation today patient's wounds actually appear to be doing quite well currently. There is no sign of active infection at this time. No fevers, chills, nausea, vomiting, or diarrhea. 04/11/2025 on evaluation today patient appears to be doing excellent in regard to his wounds on the legs to be honest. He has made a lot of progress there does not appear to be any signs of active infection and overall I think that he is better than previous although still the wounds are quite significant obviously. In general I think that he is pleased with where things stand at this point. Still there is quite a bit of work to do here. 07/13/2020 upon evaluation today patient appears to be doing well with regard to his  wound. Is been tolerating the dressing changes without complication on both legs. He just has the 2 main wounds on each leg at the ankle on the medial region of the right leg is completely healed. His wounds do seem to have gotten better during the time that he has been on bedrest as result of the pelvis fracture.  Obviously does not the way that we wanted to see things improved but nonetheless he tells me that it is what it is. Fortunately he does seem to be doing better he tells me that her daughter from the [place] is actually down helping to take care of them out as he and his wife while he is recovering. 08/08/2020 upon evaluation today patient appears to be doing well currently in regard to his leg ulcers. He has been tolerating the dressing changes without complication. Fortunately there is no signs of active infection at this time. No fevers, chills, nausea, vomiting, or diarrhea. Objective Constitutional Well-nourished and well-hydrated in no acute distress. Vitals Time Taken: 10:15 AM, Height: 71 in, Weight: 220 lbs, BMI: 30.7, Temperature: 98.1 F, Pulse: 70 bpm, Respiratory Rate: 20 breaths/min, Blood Pressure: 179/76 mmHg. Respiratory normal breathing without difficulty. Psychiatric this patient is able to make decisions and demonstrates good insight into disease process. Alert and Oriented x 3. pleasant and cooperative. General Notes: Upon inspection patient's wound bed actually showed signs of good granulation epithelization at this point. I do not see any signs of infection which is great news and overall I am extremely pleased with where he stands currently. Overall he has been changing his own dressings and this seems to be doing a great job. Integumentary (Hair, Skin) Wound #18 status is Open. Original cause of wound was Gradually Appeared. The date acquired was: 04/11/2020. The wound has been in treatment 17 weeks. The wound is located on the Right,Proximal,Lateral Lower Leg. The  wound measures 4.3cm length x 2.9cm width x 0.1cm depth; 9.794cm^2 area and 0.979cm^3 volume. There is Fat Layer (Subcutaneous Tissue) exposed. There is no tunneling or undermining noted. There is a medium amount of serosanguineous drainage noted. The wound margin is flat and intact. There is large (67-100%) red granulation within the wound bed. There is no necrotic tissue within the wound bed. Wound #2 status is Open. Original cause of wound was Gradually Appeared. The date acquired was: 03/13/2017. The wound has been in treatment 149 weeks. The wound is located on the Left,Distal,Lateral Lower Leg. The wound measures 11.5cm length x 12cm width x 0.1cm depth; 108.385cm^2 area and 10.838cm^3 volume. There is Fat Layer (Subcutaneous Tissue) exposed. There is no tunneling or undermining noted. There is a large amount of serosanguineous drainage noted. The wound margin is flat and intact. There is large (67-100%) red granulation within the wound bed. There is no necrotic tissue within the wound bed. Wound #5 status is Open. Original cause of wound was Gradually Appeared. The date acquired was: 10/26/2017. The wound has been in treatment 145 weeks. The wound is located on the Right,Lateral Lower Leg. The wound measures 7cm length x 11.5cm width x 0.1cm depth; 63.225cm^2 area and 6.322cm^3 volume. There is Fat Layer (Subcutaneous Tissue) exposed. There is no tunneling or undermining noted. There is a large amount of serosanguineous drainage noted. The wound margin is flat and intact. There is large (67-100%) red granulation within the wound bed. There is no necrotic tissue within the wound bed. 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 -  on days that dressing is changed Edema Control - Lymphedema / SCD / Other: Elevate legs to the level of the heart or above for 30 minutes daily and/or when sitting, a frequency of: - throughout the day Avoid standing for long periods of time. Exercise regularly Moisturize legs daily. Compression stocking or Garment 20-30 mm/Hg pressure to: - Juxtalite to both legs daily WOUND #18: - Lower Leg Wound Laterality: Right, Lateral, Proximal Cleanser: Soap and Water 3 x Per Week/30 Days Discharge Instructions: May shower and wash wound with dial antibacterial soap and water prior to dressing change. Peri-Wound Care: Zinc Oxide Ointment 30g tube 3 x Per Week/30 Days Discharge Instructions: Apply Zinc Oxide to periwound with each dressing change as needed for maceration Peri-Wound Care: Sween Lotion (Moisturizing lotion) 3 x Per Week/30 Days Discharge Instructions: Apply moisturizing lotion as directed Prim Dressing: Fibracol Plus Dressing, 4x4.38 in (collagen) (Generic) 3 x Per Week/30 Days ary Discharge Instructions: Moisten collagen with saline or hydrogel Secondary Dressing: ABD Pad, 5x9 (Generic) 3 x Per Week/30 Days Discharge Instructions: Apply over primary dressing as directed. Secondary Dressing: Zetuvit Plus 4x8 in 3 x Per Week/30 Days Discharge Instructions: Apply over primary dressing as directed. Secondary Dressing: Xtrasorb Classic Super Absorbent Dressing, 6x9 (in/in) (Generic) 3 x Per Week/30 Days Discharge Instructions: Apply over primary dressing as directed. Secured With: Elastic Bandage 4 inch (ACE bandage) 3 x Per Week/30 Days Discharge Instructions: Secure with ACE bandage as directed. Secured With: [employer], 4.5x3.1 (in/yd) (Generic) 3 x Per Week/30 Days Discharge  Instructions: Secure with Kerlix as directed. WOUND #2: - Lower Leg Wound Laterality: Left, Lateral, Distal Cleanser: Soap and Water 3 x Per Week/30 Days Discharge Instructions: May shower and wash wound with dial antibacterial soap and water prior to dressing change. Peri-Wound Care: Zinc Oxide Ointment 30g tube 3 x Per Week/30 Days Discharge Instructions: Apply Zinc Oxide to periwound with each dressing change as needed for maceration Peri-Wound Care: Sween Lotion (Moisturizing lotion) 3 x Per Week/30 Days Discharge Instructions: Apply moisturizing lotion as directed Prim Dressing: Fibracol Plus Dressing, 4x4.38 in (collagen) (DME) (Dispense As Written) 3 x Per Week/30 Days ary Discharge Instructions: Moisten collagen with saline or hydrogel Secondary Dressing: ABD Pad, 5x9 (DME) (Generic) 3 x Per Week/30 Days Discharge Instructions: Apply over primary dressing as directed. Secondary Dressing: Zetuvit Plus 4x8 in 3 x Per Week/30 Days Discharge Instructions: Apply over primary dressing as directed. Secondary Dressing: Xtrasorb Classic Super Absorbent Dressing, 6x9 (in/in) (DME) (Dispense As Written) 3 x Per Week/30 Days Discharge Instructions: Apply over primary dressing as directed. Secured With: Elastic Bandage 4 inch (ACE bandage) 3 x Per Week/30 Days Discharge Instructions: Secure with ACE bandage as directed. Secured With: [employer], 4.5x3.1 (in/yd) (DME) (Generic) 3 x Per Week/30 Days Discharge Instructions: Secure with Kerlix as directed. WOUND #5: - Lower Leg Wound Laterality: Right, Lateral Cleanser: Soap and Water 3 x Per Week/30 Days Discharge Instructions: May shower and wash wound with dial antibacterial soap and water prior to dressing change. Peri-Wound Care: Zinc Oxide Ointment 30g tube 3 x Per Week/30 Days Discharge Instructions: Apply Zinc Oxide to periwound with each dressing change as needed for maceration Peri-Wound Care: Sween Lotion (Moisturizing lotion) 3  x Per Week/30 Days Discharge Instructions: Apply moisturizing lotion as directed Prim Dressing: Fibracol Plus Dressing, 4x4.38 in (collagen) (DME) (Dispense As Written) 3 x Per Week/30 Days ary Discharge Instructions: Moisten collagen with saline or hydrogel Secondary Dressing: ABD Pad, 5x9 (DME) (Generic) 3  x Per Week/30 Days Discharge Instructions: Apply over primary dressing as directed. Secondary Dressing: Zetuvit Plus 4x8 in 3 x Per Week/30 Days Discharge Instructions: Apply over primary dressing as directed. Secondary Dressing: Xtrasorb Classic Super Absorbent Dressing, 6x9 (in/in) (DME) (Dispense As Written) 3 x Per Week/30 Days Discharge Instructions: Apply over primary dressing as directed. Secured With: Elastic Bandage 4 inch (ACE bandage) 3 x Per Week/30 Days Discharge Instructions: Secure with ACE bandage as directed. Secured With: [employer], 4.5x3.1 (in/yd) (DME) (Generic) 3 x Per Week/30 Days Discharge Instructions: Secure with Kerlix as directed. 1. Would recommend that we going continue with the wound care measures as before and the patient is in agreement with the plan. This includes the use of the femoral call which seems to be doing a great job. 2. He is using over top of this the ABD pads followed by Zetuvit which has been helpful for him. 3. I am also can recommend that he continue to use his roll gauze to secure everything in place and then subsequently he is using an Ace bandage with his compression wrap which is Velcro nature over top of this. We will see patient back for reevaluation in 6 weeks here in the clinic. If anything worsens or changes patient will contact our office for additional recommendations. Electronic Signature(s) Signed: 08/08/2020 11:05:28 AM By: Worthy Keeler PA-C Entered By: Worthy Keeler on 08/08/2020 11:05:28 -------------------------------------------------------------------------------- SuperBill Details Patient Name: Date of  Service: [name] 08/08/2020 Medical Record Number: [id number] Patient Account Number: [id number] Date of Birth/Sex: Treating RN: 28-Feb-1940 (80 y.o. [name] Primary Care Provider: [name] Other Clinician: Referring Provider: Treating Provider/Extender: [name] in Treatment: 149 Diagnosis Coding ICD-10 Codes Code Description I87.2 Venous insufficiency (chronic) (peripheral) Q81.8 Other epidermolysis bullosa L97.322 Non-pressure chronic ulcer of left ankle with fat layer exposed L97.822 Non-pressure chronic ulcer of other part of left lower leg with fat layer exposed L97.812 Non-pressure chronic ulcer of other part of right lower leg with fat layer exposed [place] (primary) hypertension Facility Procedures CPT4 Code: [id number] Description: [phone] - WOUND CARE VISIT-LEV 5 EST PT Modifier: Quantity: 1 Physician Procedures : CPT4 Code Description Modifier [id number] - WC PHYS LEVEL 3 - EST PT ICD-10 Diagnosis Description I87.2 Venous insufficiency (chronic) (peripheral) Q81.8 Other epidermolysis bullosa L97.322 Non-pressure chronic ulcer of left ankle with fat layer  exposed L97.822 Non-pressure chronic ulcer of other part of left lower leg with fat layer exposed Quantity: 1 Electronic Signature(s) Signed: 08/08/2020 4:30:28 PM By: Worthy Keeler PA-C Signed: 08/08/2020 5:32:13 PM By: Baruch Gouty RN, BSN Previous Signature: 08/08/2020 11:05:45 AM Version By: Worthy Keeler PA-C Entered By: Baruch Gouty on 08/08/2020 12:43:01

## 2020-08-13 NOTE — Progress Notes (Signed)
HANNIBAL, SKALLA ([id number]) Visit Report for 08/08/2020 Arrival Information Details Patient Name: Date of Service: Carlos White 08/08/2020 10:00 A M Medical Record Number: [id number] Patient Account Number: [id number] Date of Birth/Sex: Treating RN: [birth year] (80 y.o. [name], [name] Primary Care Zaidyn Claire: [name] Other Clinician: Referring Proctor Carriker: Treating Shiasia Porro/Extender: [name] in Treatment: 6 Visit Information History Since Last Visit Added or deleted any medications: No Patient Arrived: Ambulatory Any new allergies or adverse reactions: No Arrival Time: 10:10 Had a fall or experienced change in No Accompanied By: self activities of daily living that may affect Transfer Assistance: None risk of falls: Patient Identification Verified: Yes Signs or symptoms of abuse/neglect since last visito No Secondary Verification Process Completed: Yes Hospitalized since last visit: No Patient Requires Transmission-Based Precautions: No Implantable device outside of the clinic excluding No Patient Has Alerts: Yes cellular tissue based products placed in the center Patient Alerts: R ABI= 1.23, TBI = .86 since last visit: L ABI= 1.28, TBI=.81 Has Dressing in Place as Prescribed: Yes Pain Present Now: No Electronic Signature(s) Signed: 08/08/2020 5:41:26 PM By: [name] Entered By: [name] on 08/08/2020 10:17:18 -------------------------------------------------------------------------------- Clinic Level of Care Assessment Details Patient Name: Date of Service: Carlos White 08/08/2020 10:00 A M Medical Record Number: [id number] Patient Account Number: [id number] Date of Birth/Sex: Treating RN: [birth year] (80 y.o. [name] Primary Care Suraj Ramdass: [name] Other Clinician: Referring Emonee Winkowski: Treating Porshe Fleagle/Extender: [name] in Treatment: [place] Assessment Items TOOL  4 Quantity Score [] - 0 Use when only an EandM is performed on FOLLOW-UP visit ASSESSMENTS - Nursing Assessment / Reassessment X- 1 10 Reassessment of Co-morbidities (includes updates in patient status) X- 1 5 Reassessment of Adherence to Treatment Plan ASSESSMENTS - Wound and Skin A ssessment / Reassessment [] - 0 Simple Wound Assessment / Reassessment - one wound X- 4 5 Complex Wound Assessment / Reassessment - multiple wounds [] - 0 Dermatologic / Skin Assessment (not related to wound area) ASSESSMENTS - Focused Assessment X- 2 5 Circumferential Edema Measurements - multi extremities [] - 0 Nutritional Assessment / Counseling / Intervention X- 1 5 Lower Extremity Assessment (monofilament, tuning fork, pulses) [] - 0 Peripheral Arterial Disease Assessment (using hand held doppler) ASSESSMENTS - Ostomy and/or Continence Assessment and Care [] - 0 Incontinence Assessment and Management [] - 0 Ostomy Care Assessment and Management (repouching, etc.) PROCESS - Coordination of Care X - Simple Patient / Family Education for ongoing care 1 15 [] - 0 Complex (extensive) Patient / Family Education for ongoing care X- 1 10 Staff obtains Programmer, systems, Records, T Results / Process Orders est [] - 0 Staff telephones HHA, Nursing Homes / Clarify orders / etc [] - 0 Routine Transfer to another Facility (non-emergent condition) [] - 0 Routine Hospital Admission (non-emergent condition) [] - 0 New Admissions / Biomedical engineer / Ordering NPWT Apligraf, etc. , [] - 0 Emergency Hospital Admission (emergent condition) X- 1 10 Simple Discharge Coordination [] - 0 Complex (extensive) Discharge Coordination PROCESS - Special Needs [] - 0 Pediatric / Minor Patient Management [] - 0 Isolation Patient Management [] - 0 Hearing / Language / Visual special needs [] - 0 Assessment of Community assistance (transportation, D/C planning, etc.) [] - 0 Additional assistance / Altered  mentation [] - 0 Support Surface(s) Assessment (bed, cushion, seat, etc.) INTERVENTIONS - Wound Cleansing / Measurement [] -  0 Simple Wound Cleansing - one wound X- 3 5 Complex Wound Cleansing - multiple wounds X- 1 5 Wound Imaging (photographs - any number of wounds) [] - 0 Wound Tracing (instead of photographs) [] - 0 Simple Wound Measurement - one wound X- 3 5 Complex Wound Measurement - multiple wounds INTERVENTIONS - Wound Dressings [] - 0 Small Wound Dressing one or multiple wounds X- 2 15 Medium Wound Dressing one or multiple wounds [] - 0 Large Wound Dressing one or multiple wounds X- 1 5 Application of Medications - topical [] - 0 Application of Medications - injection INTERVENTIONS - Miscellaneous [] - 0 External ear exam [] - 0 Specimen Collection (cultures, biopsies, blood, body fluids, etc.) [] - 0 Specimen(s) / Culture(s) sent or taken to Lab for analysis [] - 0 Patient Transfer (multiple staff / Civil Service fast streamer / Similar devices) [] - 0 Simple Staple / Suture removal (25 or less) [] - 0 Complex Staple / Suture removal (26 or more) [] - 0 Hypo / Hyperglycemic Management (close monitor of Blood Glucose) [] - 0 Ankle / Brachial Index (ABI) - do not check if billed separately X- 1 5 Vital Signs Has the patient been seen at the hospital within the last three years: Yes Total Score: 160 Level Of Care: New/Established - Level 5 Electronic Signature(s) Signed: 08/08/2020 5:32:13 PM By: Baruch Gouty RN, BSN Entered By: Baruch Gouty on 08/08/2020 12:42:50 -------------------------------------------------------------------------------- Encounter Discharge Information Details Patient Name: Date of Service: Carlos White, Carlos RDO N H. 08/08/2020 10:00 A M Medical Record Number: [id number] Patient Account Number: [id number] Date of Birth/Sex: Treating RN: 06/20/1940 (80 y.o. [name] Primary Care Provider: [name] Other Clinician: Referring  Provider: Treating Provider/Extender: [name] in Treatment: [phone] Encounter Discharge Information Items Discharge Condition: Stable Ambulatory Status: Cane Discharge Destination: Home Transportation: Private Auto Accompanied By: self Schedule Follow-up Appointment: Yes Clinical Summary of Care: Patient Declined Electronic Signature(s) Signed: 08/08/2020 5:32:13 PM By: Baruch Gouty RN, BSN Entered By: Baruch Gouty on 08/08/2020 12:44:37 -------------------------------------------------------------------------------- Lower Extremity Assessment Details Patient Name: Date of Service: Carlos Dull RDO [name] 08/08/2020 10:00 A M Medical Record Number: [id number] Patient Account Number: [id number] Date of Birth/Sex: Treating RN: 1941/01/05 (80 y.o. [name] Primary Care Provider: [name] Other Clinician: Referring Provider: Treating Provider/Extender: [name] in Treatment: 149 Edema Assessment Assessed: [Left: Yes] Patrice [place]: Yes] Edema: [Left: No] [Right: No] Calf Left: Right: Point of Measurement: 35.5 cm From Medial Instep 37 cm 35 cm Ankle Left: Right: Point of Measurement: 11 cm From Medial Instep 28 cm 25 cm Vascular Assessment Pulses: Dorsalis Pedis Palpable: [Left:Yes] [Right:Yes] Electronic Signature(s) Signed: 08/08/2020 5:41:26 PM By: [name] Entered By: [name] on 08/08/2020 10:19:15 -------------------------------------------------------------------------------- Multi-Disciplinary Care Plan Details Patient Name: Date of Service: Carlos White, Carlos RDO N H. 08/08/2020 10:00 A M Medical Record Number: [id number] Patient Account Number: [id number] Date of Birth/Sex: Treating RN: 26-Mar-1940 (80 y.o. [name] Primary Care Provider: [name] Other Clinician: Referring Provider: Treating Provider/Extender: [name] in Treatment: [place]  reviewed with physician Active Inactive Venous Leg Ulcer Nursing Diagnoses: Knowledge deficit related to disease process and management Potential for venous Insuffiency (use before diagnosis confirmed) Goals: Patient will maintain optimal edema control Date Initiated: 09/30/2017 Target Resolution Date: 09/05/2020 Goal Status: Active Patient/caregiver will verbalize understanding of disease process and disease management Date Initiated: 09/30/2017 Date Inactivated: 11/04/2017 Target  Resolution Date: 10/30/2017 Goal Status: Met Interventions: Assess peripheral edema status every visit. Provide education on venous insufficiency Notes: Wound/Skin Impairment Nursing Diagnoses: Impaired tissue integrity Knowledge deficit related to ulceration/compromised skin integrity Goals: Patient/caregiver will verbalize understanding of skin care regimen Date Initiated: 09/30/2017 Target Resolution Date: 09/05/2020 Goal Status: Active Ulcer/skin breakdown will have a volume reduction of 30% by week 4 Date Initiated: 09/30/2017 Date Inactivated: 11/04/2017 Target Resolution Date: 10/30/2017 Goal Status: Met Interventions: Assess patient/caregiver ability to obtain necessary supplies Assess patient/caregiver ability to perform ulcer/skin care regimen upon admission and as needed Assess ulceration(s) every visit Provide education on ulcer and skin care Notes: Electronic Signature(s) Signed: 08/08/2020 5:32:13 PM By: Baruch Gouty RN, BSN Entered By: Baruch Gouty on 08/08/2020 12:40:48 -------------------------------------------------------------------------------- Pain Assessment Details Patient Name: Date of Service: Carlos White, Carlos RDO N H. 08/08/2020 10:00 A M Medical Record Number: [id number] Patient Account Number: [id number] Date of Birth/Sex: Treating RN: [birth year] (80 y.o. [name] Primary Care Provider: [name] Other Clinician: Referring Provider: Treating  Provider/Extender: [name] in Treatment: [id number] Active Problems Location of Pain Severity and Description of Pain Patient Has Paino No Site Locations Rate the pain. Current Pain Level: 0 Pain Management and Medication Current Pain Management: Medication: No Cold Application: No Rest: No Massage: No Activity: No T.E.N.S.: No Heat Application: No Leg drop or elevation: No Is the Current Pain Management Adequate: Adequate How does your wound impact your activities of daily livingo Sleep: No Bathing: No Appetite: No Relationship With Others: No Bladder Continence: No Emotions: No Bowel Continence: No Work: No Toileting: No Drive: No Dressing: No Hobbies: No Electronic Signature(s) Signed: 08/08/2020 5:41:26 PM By: [name] Entered By: [name] on 08/08/2020 10:18:39 -------------------------------------------------------------------------------- Patient/Caregiver Education Details Patient Name: Date of Service: RA YLE, Carlos RDO N H. 7/27/2022andnbsp10:00 A M Medical Record Number: [id number] Patient Account Number: [id number] Date of Birth/Gender: Treating RN: [birth year] (80 y.o. [name] Primary Care Physician: [name] Other Clinician: Referring Physician: Treating Physician/Extender: [name] in Treatment: [phone] Education Assessment Education Provided To: Patient Education Topics Provided Venous: Methods: Explain/Verbal Responses: Reinforcements needed, State content correctly Wound/Skin Impairment: Methods: Explain/Verbal Responses: Reinforcements needed, State content correctly Electronic Signature(s) Signed: 08/08/2020 5:32:13 PM By: Baruch Gouty RN, BSN Entered By: Baruch Gouty on 08/08/2020 12:41:16 -------------------------------------------------------------------------------- Wound Assessment Details Patient Name: Date of Service: Carlos Dull RDO [name] 08/08/2020 10:00 A  M Medical Record Number: [id number] Patient Account Number: [id number] Date of Birth/Sex: Treating RN: 03-03-1940 (80 y.o. [name] Primary Care Provider: [name] Other Clinician: Referring Provider: Treating Provider/Extender: [name] in Treatment: 149 Wound Status Wound Number: 18 Primary Vasculopathy Etiology: Wound Location: Right, Proximal, Lateral Lower Leg Wound Status: Open Wounding Event: Gradually Appeared Comorbid Cataracts, Chronic Obstructive Pulmonary Disease (COPD), Date Acquired: 04/11/2020 History: Hypertension Weeks Of Treatment: 17 Clustered Wound: No Photos Wound Measurements Length: (cm) 4.3 Width: (cm) 2.9 Depth: (cm) 0.1 Area: (cm) 9.794 Volume: (cm) 0.979 % Reduction in Area: -38.5% % Reduction in Volume: -38.5% Epithelialization: Small (1-33%) Tunneling: No Undermining: No Wound Description Classification: Full Thickness Without Exposed Support Structu Wound Margin: Flat and Intact Exudate Amount: Medium Exudate Type: Serosanguineous Exudate Color: red, brown res Foul Odor After Cleansing: No Slough/Fibrino No Wound Bed Granulation Amount: Large (67-100%) Exposed Structure Granulation Quality: Red Fascia Exposed: No Necrotic Amount: None Present (0%) Fat Layer (Subcutaneous Tissue) Exposed: Yes Tendon Exposed: No Muscle Exposed: No  Joint Exposed: No Bone Exposed: No Treatment Notes Wound #18 (Lower Leg) Wound Laterality: Right, Lateral, Proximal Cleanser Soap and Water Discharge Instruction: May shower and wash wound with dial antibacterial soap and water prior to dressing change. Peri-Wound Care Zinc Oxide Ointment 30g tube Discharge Instruction: Apply Zinc Oxide to periwound with each dressing change as needed for maceration Sween Lotion (Moisturizing lotion) Discharge Instruction: Apply moisturizing lotion as directed Topical Primary Dressing Fibracol Plus Dressing, 4x4.38 in  (collagen) Discharge Instruction: Moisten collagen with saline or hydrogel Secondary Dressing ABD Pad, 5x9 Discharge Instruction: Apply over primary dressing as directed. Zetuvit Plus 4x8 in Discharge Instruction: Apply over primary dressing as directed. Secured With Elastic Bandage 4 inch (ACE bandage) Discharge Instruction: Secure with ACE bandage as directed. Kerlix Roll Sterile, 4.5x3.1 (in/yd) Discharge Instruction: Secure with Kerlix as directed. Compression Wrap Compression Stockings Add-Ons Electronic Signature(s) Signed: 08/08/2020 5:32:13 PM By: Baruch Gouty RN, BSN Signed: 08/08/2020 5:41:26 PM By: [name] Entered By: [name] on 08/08/2020 10:19:33 -------------------------------------------------------------------------------- Wound Assessment Details Patient Name: Date of Service: Carlos White, Carlos RDO N H. 08/08/2020 10:00 A M Medical Record Number: [id number] Patient Account Number: [id number] Date of Birth/Sex: Treating RN: [birth year] (80 y.o. [name] Primary Care Narayan Scull: [name] Other Clinician: Referring Wing Schoch: Treating Anaria Kroner/Extender: [name] in Treatment: 149 Wound Status Wound Number: 2 Primary Venous Leg Ulcer Etiology: Wound Location: Left, Distal, Lateral Lower Leg Wound Status: Open Wounding Event: Gradually Appeared Comorbid Cataracts, Chronic Obstructive Pulmonary Disease (COPD), Date Acquired: 03/13/2017 History: Hypertension Weeks Of Treatment: 149 Clustered Wound: No Photos Wound Measurements Length: (cm) 11.5 Width: (cm) 12 Depth: (cm) 0.1 Area: (cm) 108.385 Volume: (cm) 10.838 % Reduction in Area: -39.8% % Reduction in Volume: 72% Epithelialization: Medium (34-66%) Tunneling: No Undermining: No Wound Description Classification: Full Thickness Without Exposed Support Structures Wound Margin: Flat and Intact Exudate Amount: Large Exudate Type: Serosanguineous Exudate Color:  red, brown Foul Odor After Cleansing: No Slough/Fibrino No Wound Bed Granulation Amount: Large (67-100%) Exposed Structure Granulation Quality: Red Fascia Exposed: No Necrotic Amount: None Present (0%) Fat Layer (Subcutaneous Tissue) Exposed: Yes Tendon Exposed: No Muscle Exposed: No Joint Exposed: No Bone Exposed: No Treatment Notes Wound #2 (Lower Leg) Wound Laterality: Left, Lateral, Distal Cleanser Soap and Water Discharge Instruction: May shower and wash wound with dial antibacterial soap and water prior to dressing change. Peri-Wound Care Zinc Oxide Ointment 30g tube Discharge Instruction: Apply Zinc Oxide to periwound with each dressing change as needed for maceration Sween Lotion (Moisturizing lotion) Discharge Instruction: Apply moisturizing lotion as directed Topical Primary Dressing Fibracol Plus Dressing, 4x4.38 in (collagen) Discharge Instruction: Moisten collagen with saline or hydrogel Secondary Dressing ABD Pad, 5x9 Discharge Instruction: Apply over primary dressing as directed. Zetuvit Plus 4x8 in Discharge Instruction: Apply over primary dressing as directed. Secured With Elastic Bandage 4 inch (ACE bandage) Discharge Instruction: Secure with ACE bandage as directed. Kerlix Roll Sterile, 4.5x3.1 (in/yd) Discharge Instruction: Secure with Kerlix as directed. Compression Wrap Compression Stockings Add-Ons Electronic Signature(s) Signed: 08/08/2020 5:41:26 PM By: [name] Signed: 08/13/2020 1:33:39 PM By: Sandre Kitty Entered By: Sandre Kitty on 08/08/2020 10:24:52 -------------------------------------------------------------------------------- Wound Assessment Details Patient Name: Date of Service: Carlos Dull RDO [name] 08/08/2020 10:00 A M Medical Record Number: [id number] Patient Account Number: [id number] Date of Birth/Sex: Treating RN: 11-25-1940 (80 y.o. [name] Primary Care Aisia Correira: [name] Other Clinician: Referring  Cookie Pore: Treating Jaylinn Hellenbrand/Extender: [name] in Treatment: 149 Wound  Status Wound Number: 5 Primary Vasculopathy Etiology: Wound Location: Right, Lateral Lower Leg Wound Status: Open Wounding Event: Gradually Appeared Comorbid Cataracts, Chronic Obstructive Pulmonary Disease (COPD), Date Acquired: 10/26/2017 History: Hypertension Weeks Of Treatment: 145 Clustered Wound: Yes Photos Wound Measurements Length: (cm) 7 Width: (cm) 11.5 Depth: (cm) 0.1 Area: (cm) 63.225 Volume: (cm) 6.322 % Reduction in Area: -16.5% % Reduction in Volume: -16.4% Epithelialization: Small (1-33%) Tunneling: No Undermining: No Wound Description Classification: Full Thickness Without Exposed Support Structures Wound Margin: Flat and Intact Exudate Amount: Large Exudate Type: Serosanguineous Exudate Color: red, brown Foul Odor After Cleansing: No Slough/Fibrino No Wound Bed Granulation Amount: Large (67-100%) Exposed Structure Granulation Quality: Red Fascia Exposed: No Necrotic Amount: None Present (0%) Fat Layer (Subcutaneous Tissue) Exposed: Yes Tendon Exposed: No Muscle Exposed: No Joint Exposed: No Bone Exposed: No Treatment Notes Wound #5 (Lower Leg) Wound Laterality: Right, Lateral Cleanser Soap and Water Discharge Instruction: May shower and wash wound with dial antibacterial soap and water prior to dressing change. Peri-Wound Care Zinc Oxide Ointment 30g tube Discharge Instruction: Apply Zinc Oxide to periwound with each dressing change as needed for maceration Sween Lotion (Moisturizing lotion) Discharge Instruction: Apply moisturizing lotion as directed Topical Primary Dressing Fibracol Plus Dressing, 4x4.38 in (collagen) Discharge Instruction: Moisten collagen with saline or hydrogel Secondary Dressing ABD Pad, 5x9 Discharge Instruction: Apply over primary dressing as directed. Zetuvit Plus 4x8 in Discharge Instruction: Apply over primary  dressing as directed. Secured With Elastic Bandage 4 inch (ACE bandage) Discharge Instruction: Secure with ACE bandage as directed. Kerlix Roll Sterile, 4.5x3.1 (in/yd) Discharge Instruction: Secure with Kerlix as directed. Compression Wrap Compression Stockings Add-Ons Electronic Signature(s) Signed: 08/08/2020 5:41:26 PM By: [name] Signed: 08/13/2020 1:33:39 PM By: Sandre Kitty Entered By: Sandre Kitty on 08/08/2020 10:26:12 -------------------------------------------------------------------------------- Vitals Details Patient Name: Date of Service: Carlos White, Carlos RDO N H. 08/08/2020 10:00 A M Medical Record Number: [id number] Patient Account Number: [id number] Date of Birth/Sex: Treating RN: 11/01/1940 (80 y.o. [name] Primary Care Provider: [name] Other Clinician: Referring Provider: Treating Provider/Extender: [name] in Treatment: 149 Vital Signs Time Taken: 10:15 Temperature (F): 98.1 Height (in): 71 Pulse (bpm): 70 Weight (lbs): 220 Respiratory Rate (breaths/min): 20 Body Mass Index (BMI): 30.7 Blood Pressure (mmHg): 179/76 Reference Range: 80 - 120 mg / dl Electronic Signature(s) Signed: 08/08/2020 5:41:26 PM By: [name] Signed: 08/08/2020 5:41:26 PM By: [name] Entered By: [name] on 08/08/2020 10:18:31

## 2020-08-24 ENCOUNTER — Other Ambulatory Visit: Payer: Self-pay | Admitting: Internal Medicine

## 2020-08-25 DIAGNOSIS — M6281 Muscle weakness (generalized): Secondary | ICD-10-CM | POA: Diagnosis not present

## 2020-08-25 DIAGNOSIS — Z9181 History of falling: Secondary | ICD-10-CM | POA: Diagnosis not present

## 2020-08-25 DIAGNOSIS — S32502D Unspecified fracture of left pubis, subsequent encounter for fracture with routine healing: Secondary | ICD-10-CM | POA: Diagnosis not present

## 2020-08-25 DIAGNOSIS — R2681 Unsteadiness on feet: Secondary | ICD-10-CM | POA: Diagnosis not present

## 2020-08-25 DIAGNOSIS — M545 Low back pain, unspecified: Secondary | ICD-10-CM | POA: Diagnosis not present

## 2020-08-28 ENCOUNTER — Other Ambulatory Visit: Payer: Self-pay | Admitting: Internal Medicine

## 2020-09-04 DIAGNOSIS — H353132 Nonexudative age-related macular degeneration, bilateral, intermediate dry stage: Secondary | ICD-10-CM | POA: Diagnosis not present

## 2020-09-04 DIAGNOSIS — H35033 Hypertensive retinopathy, bilateral: Secondary | ICD-10-CM | POA: Diagnosis not present

## 2020-09-04 DIAGNOSIS — H43813 Vitreous degeneration, bilateral: Secondary | ICD-10-CM | POA: Diagnosis not present

## 2020-09-12 ENCOUNTER — Ambulatory Visit: Payer: Medicare PPO | Admitting: Internal Medicine

## 2020-09-19 ENCOUNTER — Encounter (HOSPITAL_BASED_OUTPATIENT_CLINIC_OR_DEPARTMENT_OTHER): Payer: Medicare PPO | Admitting: Physician Assistant

## 2020-09-26 ENCOUNTER — Encounter (HOSPITAL_BASED_OUTPATIENT_CLINIC_OR_DEPARTMENT_OTHER): Payer: Medicare PPO | Attending: Physician Assistant | Admitting: Physician Assistant

## 2020-09-26 ENCOUNTER — Other Ambulatory Visit: Payer: Self-pay

## 2020-09-26 DIAGNOSIS — L97822 Non-pressure chronic ulcer of other part of left lower leg with fat layer exposed: Secondary | ICD-10-CM | POA: Insufficient documentation

## 2020-09-26 DIAGNOSIS — L97322 Non-pressure chronic ulcer of left ankle with fat layer exposed: Secondary | ICD-10-CM | POA: Insufficient documentation

## 2020-09-26 DIAGNOSIS — L97812 Non-pressure chronic ulcer of other part of right lower leg with fat layer exposed: Secondary | ICD-10-CM | POA: Diagnosis not present

## 2020-09-26 DIAGNOSIS — I10 Essential (primary) hypertension: Secondary | ICD-10-CM | POA: Insufficient documentation

## 2020-09-26 DIAGNOSIS — Q818 Other epidermolysis bullosa: Secondary | ICD-10-CM | POA: Insufficient documentation

## 2020-09-26 DIAGNOSIS — J449 Chronic obstructive pulmonary disease, unspecified: Secondary | ICD-10-CM | POA: Insufficient documentation

## 2020-09-26 DIAGNOSIS — I872 Venous insufficiency (chronic) (peripheral): Secondary | ICD-10-CM | POA: Insufficient documentation

## 2020-09-26 NOTE — Progress Notes (Addendum)
UBALDO, SIVA ([id number]) Visit Report for 09/26/2020 Chief Complaint Document Details Patient Name: Date of Service: Carlos White 09/26/2020 12:30 PM Medical Record Number: [id number] Patient Account Number: [id number] Date of Birth/Sex: Treating RN: Nov 14, 1940 (80 y.o. [name] Primary Care Provider: [name] Other Clinician: Referring Provider: Treating Provider/Extender: [name] in Treatment: [place] from: Patient Chief Complaint Bilateral LE Ulcers Electronic Signature(s) Signed: 09/26/2020 1:12:06 PM By: Worthy Keeler PA-C Entered By: Worthy Keeler on 09/26/2020 13:12:06 -------------------------------------------------------------------------------- HPI Details Patient Name: Date of Service: Carlos White, Carlos White. 09/26/2020 12:30 PM Medical Record Number: [id number] Patient Account Number: [id number] Date of Birth/Sex: Treating RN: 1940/11/22 (80 y.o. [name] Primary Care Provider: [name] Other Clinician: Referring Provider: Treating Provider/Extender: [name] in Treatment: 156 History of Present Illness HPI Description: 09/30/17 on evaluation today patient presents for initial evaluation and our clinic concerning issues that he has been having with his bilateral lower extremities. He states this has been going on for quite some time at least six months. Currently his regiment has been mainly cleaning the area with peroxide, applying the is foreign ointment, and wrapping the area with ABD pads and then an ace wrap loosely. He has dealt with issues of this nature he tells me for quite some time. He does have a history of having had a compound fracture of the left lower extremity which he thinks also makes this a much more difficult area for him to heal. He's previously been told that he had poor vascular flow but this was years ago at [facility] we do not have any of those  records at this time. He has a history of Epidermolysis Bullosa which was diagnosed around age 34 and he has been cared for at [facility] since that time. Subsequently he states this is hereditary and two of his children one male and one male also have this as well is one of his grandchildren that he is aware of. He has no evidence of infection necessarily at this point although he does have some necrotic tissue noted on the surface of the wound as far as the largest, left lateral lower extremity ulcer, is concerned. Overall I feel like all things considered he's been taking care of this very well. Obviously he has some fairly significant issues going on at this point in this regard. He does have a history otherwise of hypertension though for the most part other than the compound fracture of the left leg he seems to have been fairly healthy in my pinion. 10/07/17 on evaluation today patient actually appears to be doing better in regard to his bilateral lower extremity ulcers. With that being said he does still have some evidence of slough noted on the surface of the wounds I think the Iodoflex has been beneficial for him. His arterial studies are scheduled for October 2. With that being said I do believe that he is continuing to show signs of good improvement which is at least good news. 10/14/17 on evaluation today patient appears to be doing very well in regard to his lower extremity ulcers. He's definitely made some progress as far as healing is concerned although there still are several open areas that are going to need to be addressed. He did have his arterial study today which fortunately shows good findings with a right ABI of 1.23 with a TBI of 0.86 in the left ABI  of 1.28 with a TBI of 0.81. This is good news and will allow [place] to perform debridement as well. 10/23/2017; patient with a large wound on the left lateral calf, sizable area on the left medial malleolus and an area on the right lateral  malleolus. He has a new blister consistent with his underlying blistering skin disease just above this area we have been using Iodoflex on the lateral left calf lateral right ankle and collagen on the medial left ankle. We have been using Kerlix Coban wraps 10/28/17 on evaluation today the patient continues to have signs of improvement in regard to the overall appearance of the original wound. Unfortunately he did have some blistering over the right lateral lower extremity which has appeared to rupture on evaluation today and likely some of the dead tissue on the surface needs to be cleaned away the good news is this does not appear to be to significantly deep at this time. 11/04/17 on evaluation today patient actually appears to be doing a little better in regard to his lower extremity ulcers. He has been tolerating the dressing changes without complication. With that being said he does still have a significant wound especially over the left lateral lower extremity unfortunately. All of the wounds pretty much are going to require sharp debridement today. 11/11/17 on evaluation today patient appears to be doing more poorly in regard to his left lower extremity in particular. There does not appear to be any evidence of systemic infection although the wound itself as far as the larger left lateral lower extremity ulcer actually appears to be infected in my pinion. There's an older and the surface of the wound is dramatically worsened compared to last week. No fevers, chills, nausea, or vomiting noted at this time. 11/18/17 upon evaluation today patient actually appears to be doing better. I did review his culture today which really did not show any specific organism is a positive reason for his wound decline. There are multiple organisms present not predominant. Nonetheless he seems to be tolerate the doxycycline well in his wounds in general do seem to be doing better. Fortunately there does not appear  to be any evidence of infection at this time which is good news. Overall I'm very pleased with how things appear. Nonetheless he still has a lot of healing to Carlos. I do think he could benefit from a Juxta-Lite wrap. 11/25/17 on evaluation today patient actually appears to be doing fairly well in regard to his wounds. He is still taking the antibiotics he has a few days left. Fortunately this seems to have been excellent for him as far as getting the infection control and very happy in this regard. With that being said the patient likewise is also very pleased with how things appear at this time in comparison to where we were he's not having as much pain. 12/02/17 Seen today for follow p and management of LLE wounds. Wounds appear to show some improvement. He denies pain, fever, or chills. Completed a course of doxycycline earlier this month. Scheduled to received Juxta-Lite wrap this week. No s/s of infections. 12/09/17 on evaluation today patient actually appears to be doing a little bit better in regard to his wounds. This is obscene very slow process and unfortunately he has a couple of new areas and this is due to the Epidermolysis Bullosa. Nonetheless I am concerned about the fact that he seems to be getting more areas not less that is the reason we're gonna work on  getting schedule for the vascular referral to see the venous specialist. 12/23/17 upon evaluation today patient's wounds currently shows evidence of still not doing quite as well is what I would like to have seen. Subsequently the patient did have his venous study which showed evidence of venous stasis. Subsequently I do think that a vascular evaluation for consideration of venous intervention would be appropriate. I'm not necessarily suggesting that will be anything that can be done but I think it is at least a good idea. He is in agreement with this plan. 12/30/17 on evaluation today patient actually appears to be doing very well in  regard to his wounds when compared to previous evaluation. Subsequently we have been using the [employer] Dressing which actually appears to have done excellent on his left lateral lower extremity ulcer. The quality of the wound surface is dramatically improved. There is some slight debridement that is going to be required at a couple of locations but overall I'm extremely pleased with how things appear here. 01/07/2018; this is a patient with a primary skin disorder epidermolyis bullosa. Is a large wound on the left lateral calf and smaller wounds on the right however there is a new wound on the right mid tibia area that occurred within the compression wrap that he did not change. We have been using Hydrofera Blue. On the left he is using Hydrofera Blue and Santyl to the inferior part of the wound and changing the dressing himself. 01/15/2018; primary skin disorder epidermolysis bullosa. He has several difficult wounds including the left lateral calf, smaller wounds on the left medial calf and the right lateral calf. The major area on the left lateral calf has a smaller area inferiorly that has necrotic debris we have been using Santyl to this. The rest of the wounds we have been using Hydrofera Blue. The area on the left calf actually looks larger this week. Uncontrolled edema several small open areas above it that are superficial 01/20/18 on evaluation today patient appears to be doing better as compared to last week in regard to his wounds of the bilateral lower extremities. He tolerated the bilateral compression wrap without complication. Overall I'm very pleased with how things appear at this time. The patient likewise is very happy. 01/27/18 on evaluation today patient appears to be doing decently well in regard to his bilateral lower extremity ulcers. He's been tolerating the dressing changes without complication. One issue he had is that he did have more drainage to the left leg wrapped last  week. He states in fact he probably should come in and let us change it on Friday however he just left it in place and kept adding extra absorption with ABD pads to the external portion of the wrap. Unfortunately he does have some aspiration type breakdown nothing significant but I do believe that this was probably counterproductive in general. Nonetheless his wounds do not appear to be terrible overall. 02/03/18 on evaluation today patient appears to be doing rather well in regard to his lower extremity ulcers. He has been tolerating the dressing changes without complication. He does tell me that he had to change the wrap on the left one since we last saw him. Subsequently I do not see any evidence of infection I do feel like the food was much better controlled at this point. 02/10/18 on evaluation today patient appears to be doing rather well in regard to his ulcers. He still has significant alterations especially on the left lateral lower extremity.  Fortunately there's no signs of infection at this time. Overall I feel like he is making good progress are some areas that I'm gonna attempt some debridement today. 02/17/18 on evaluation today patient appears to be doing okay in regard to his lower Trinity ulcer. It does appear on both locations he has a little bit of drainage causing some breakdown in maceration around the wound bed's although it doesn't appear to be too bad the right is a little bit worse than left. Fortunately there's no signs of infection which is good news. No fevers, chills, nausea, or vomiting noted at this time. 03/10/18 on evaluation today patient actually appears to be doing rather poorly in regard to his bilateral lower extremity ulcers. The right in particular is draining profusely and the wound is actually enlarging which is not good. I'm concerned about both possibly infection and the fact that there's a lot of moisture which is causing breakdown as well. Unfortunately the  patient has been trying to change this at home I'm afraid he may need to change more frequently in order to see the improvement that were looking for. There's no signs of systemic infection. 03/17/18 patient actually appears to be doing significantly better at this point in regard to his bilateral lower extremity ulcers. Fortunately there's no signs of infection. That is worsening infection at least indefinitely nothing systemic. With that being said he is having a lot of drainage though not quite as much is there in his last evaluation. Overall feel like he's on the side of improvement. I think if his results back from his culture which showed that he had a positive group B strep along with abundant Pseudomonas noted on the culture. For that reason I am gonna have him continue with the linezolid as we previously have ordered for him and I did Carlos ahead as well today and prescribe Levaquin as well in order to treat the Pseudomonas portion of the infection noted. 03/24/18 on evaluation today patient actually appears to be doing very well in regard to his lower Trinity ulcer. He's been tolerating the dressing changes without complication. Fortunately both legs show signs of less drainage in his edema is very well controlled at this point as well. Overall very pleased with how things seem to be progressing. 03/31/18 on evaluation today patient actually appears to be doing excellent in regard to his bilateral lower extremity ulcers. These are not draining nearly as significant as what they were in the past overall seem to be shown signs of excellent improvement which is great news. Fortunately there is no sign of active infection at this time I do believe that the Levaquin has done extremely well for him in this regard. The patient continues to change these at home typically every day. We may be able to slowly work towards every other day changes since the drainage seems to be slowing down quite  significantly. 04/14/18 on evaluation today patient appears to be doing well in regard to his bilateral lower extremities. Let me Hilda Blades Almost completely healed which is excellent news. Fortunately he's shown signs of improvement all other sites as well with new skin growth there's some slight hyper granular tissue but for the most part this seems to be well maintained with the [facility] Dressing. I'm very happy in this regard. 04/28/18 on evaluation today patient appears to be doing rather well in regard to his ulcers of the bilateral lower extremities all things considering. He continues to make some progress as far  as new skin growth. There still some hyper granulation noted at this point despite the use of the [facility] Dressing. This is not terrible but I think we may want to consider conclude cauterization today with silver nitrate to try to help knock some of his back as well as helping with any biofilm on the surface of the wound. 05/12/18 on evaluation today patient's wounds actually appear to be doing fairly well in regard to the bilateral lower extremities. He's been tolerating the dressing changes without complication. Fortunately there's no signs of active infection at this time which is good news. Overall very pleased with how things seem to be progressing. You select silver nitrate was beneficial for him. 05/26/18 on evaluation today patient appears to be doing better in regard to left lower extremity and a little bit worse in regard to the right lower extremity. He states that he was pulling off the [facility] Dressing peel back some of the skin making this area significantly larger than what it was previous. He's not had any issues other than this and states even that hasn't caused any pain he just seems to obviously have a much larger area on the right when compared to the previous time I saw him. No fevers, chills, nausea, or vomiting noted at this time. 06/16/18 on  evaluation today patient actually appears to be doing a little better in my pinion in regard to his lower summary ulcers. He has new skin islands that seem to be spreading which is good news. Fortunately there's no signs of active infection at this time. His biggest issue is he tells me that coming as often as he does is becoming very cost prohibitive. He wonders if we can potentially spread this out. 07/14/18 on evaluation today patient appears to be doing a little bit worse in regard to his lower from the ulcer. Unfortunately he still continues to have a significant amount of drainage I think we need to do something to try to help this more. He is still somewhat reluctant to Carlos the route of the Wound VAC although that may be the most appropriate thing for him. No fevers, chills, nausea, or vomiting noted at this time. 08/18/2018 on evaluation today patient actually appears to be doing quite well with regard to his bilateral lower extremity ulcers. I do feel like that currently he is making great progress the care max does seem to be doing a great job at helping to control the moisture he has no maceration or skin breakdown. Again this seems to be an excellent way to Carlos. 1 thing we may want to change is adding collagen to the base of the wound and then the care max over top he is not opposed to this. 09/15/2018 on evaluation today patient appears to be doing well with regard to his bilateral lower extremity ulcers. He is showing some signs of improvement not necessarily in size but definitely in appearance. In fact he has a lot of new skin growing throughout the wounds along the edges as well as in the central portion of the wounds on both lower extremities. Overall I am extremely pleased to see this. 10/20/2018 on evaluation today patient actually appears to be doing quite well with regard to his wounds. They are not measuring significantly smaller but he does have a lot of new epithelization noted as  compared to previous. Fortunately there is no signs of active infection at this time. No fevers, chills, nausea, vomiting, or diarrhea. 11/17/2018  on evaluation today patient presents for reevaluation concerning his bilateral lower extremity ulcers. Fortunately there is no signs of active infection at this time today. He has been tolerating the dressing changes without complication. No fevers, chills, nausea, vomiting, or diarrhea. Unfortunately in general the patient has not made as much improvement as I would like to have seen up to this point. He has been tolerating the dressing changes without complication and he does an excellent job taking care of his wounds at home in my opinion. The biggest issue I see is that he is just not making the progress that we need to be seeing currently. I think we may want to consider having him seen at a plastic surgery appointment and he has previously seen someone in years past at [facility] in [place]. That is definitely a possibility for [place] to look into at this point. 12/29/2018 on evaluation today patient appears to be doing better in regard to the overall visual appearance of his wounds which do not appear to be as macerated. He does have a much larger skin island in the middle of the left lower extremity ulcer which is doing much better. He tells me the pain is also significantly better. With that being said overall his improvement as far as the size of the wounds is not better but again these are very irregular in change shape quite often. Fortunately there is no evidence of active infection at this time which is great news. He never heard anything from [facility] regarding the plastic surgery referral that we made to them. 01/26/2019 upon evaluation today patient appears to be doing a little better in regard to his wounds today. He has been tolerating the dressing changes again he performs these for the most part on his own. He does a great  job wrapping his legs in my opinion. Unfortunately he has not been able to get down to [facility] to see if there is anything from a plastic surgery standpoint that could be done to help with his legs simply due to the fact that his wife unfortunately sustained a compression fracture in her spine she is seeing Dr. Saintclair Halsted and subsequently is going to be having what sounds to be a kyphoplasty type procedure. With that being said that has not been scheduled yet there is still waiting on an MRI the patient is very busy in fact overly busy trying to help take care of his wife at this point. I completely understand this is more of a strain on him at this time 02/23/2019 upon evaluation today patient actually appears to be making some progress. I am actually very pleased with the overall appearance of his wounds even compared to last evaluation. He seems to be doing quite well. He is taking care of his wife unfortunately she did have a compression fracture she has had the procedure for this but still she has a slow road to recovery. For that reason he still not gone to [facility] for a second opinion in this regard. Obviously the goal there was if there was anything that can be done from a skin graft standpoint or otherwise. 03/23/2019 upon evaluation today patient continues to have issues with lower extremity ulcers. Since the beginning he has made progress but at the same time the wounds unfortunately just will not close. We have been trying to get the patient to [facility] to see a specialist there but unfortunately with the everything going on with his  wife he has not been able to make that appointment time yet he states he may be able to in the next 1-2 months but is not really sure. 04/27/2019 on evaluation today patient appears to be doing a little bit more poorly. His last evaluation. He appears to have some erythema around the edges of the wound at this point. Fortunately there is no  signs of active infection at this time which is good news. No fevers, chills, nausea, vomiting, or diarrhea. 06/08/2019 on evaluation today patient appears to be doing well with regard to his wounds. Overall they are actually measuring smaller compared to the last visit last month. We did treat him for Pseudomonas as well as methicillin-resistant Staph aureus. He was only on the treatment for MRSA however for 7 days as the Cipro was resistant and subsequently we had to place him on doxycycline. Nonetheless I am thinking that we may want to add the doxycycline and just do a month-long treatment considering the longstanding nature of his wounds and see if we get this under better control. 07/13/2019 upon evaluation today patient appears to be doing fairly well in regard to his bilateral lower extremities. There does not appear to be any signs of active infection which is good news. No fevers, chills, nausea, vomiting, or diarrhea. 08/10/2019 upon evaluation today patient appears to be doing about the same with regard to his wounds in general. Unfortunately he is not significantly better although is also not significantly worse which is great news there is no evidence of active infection at this time which is good news. He still dealing with a lot going on with his wife and therefore is not really able to Carlos see anyone at the specialty clinic at [facility] that we have previously set up still. 09/07/2019 on evaluation today patient appears to be doing well with regard to his wounds. In fact this is probably the best that have seen so far in quite a few months. Overall I am very pleased with where things stand at this time. No fevers, chills, nausea, vomiting, or diarrhea. 10/05/2019 upon evaluation today patient appears to be doing more poorly in regard to his legs at this point. He actually is showing some signs of infection. This has been something that we seem to be back-and-forth with as far as trying to keep  these wounds from becoming infected. He takes care of them very well in my opinion but nonetheless I am concerned in this regard. He tells me that his wife is still doing really about the same she is slowly getting better. Nonetheless he still spends the majority of his time helping to take care of her. 11/02/2019 upon evaluation today patient actually appears to be doing somewhat better in regard to his legs. I do believe that the compounded antibiotic treatment from [facility] has been beneficial for him. Overall I am extremely pleased with where things stand today. There is no signs of active infection at this time. The patient states he has much less drainage than he has in the past. 12/07/2019 upon evaluation today patient appears to be doing well at this time in regard to the overall appearance of his wound bed. Currently there is no signs of active infection at this time. 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 Especially when  he stressed. Fortunately there is no signs of active infection locally or systemically at this point. 01/18/2020 on evaluation today patient appears to be doing well with regard to his leg ulcers. He has been tolerating the dressing changes without complication. Fortunately there is no signs of infection and overall I feel like his legs are doing about the best they have done in quite some time. There does not appear to be any evidence of active infection which is great news and overall very pleased. 02/29/2020 upon evaluation today patient's wounds actually appear to be doing quite well currently. There is no sign of active infection at this time. No fevers, chills, nausea, vomiting, or diarrhea. 04/11/2025 on evaluation today patient appears to be doing excellent in regard to his wounds on the legs to be  honest. He has made a lot of progress there does not appear to be any signs of active infection and overall I think that he is better than previous although still the wounds are quite significant obviously. In general I think that he is pleased with where things stand at this point. Still there is quite a bit of work to do here. 07/13/2020 upon evaluation today patient appears to be doing well with regard to his wound. Is been tolerating the dressing changes without complication on both legs. He just has the 2 main wounds on each leg at the ankle on the medial region of the right leg is completely healed. His wounds do seem to have gotten better during the time that he has been on bedrest as result of the pelvis fracture. Obviously does not the way that we wanted to see things improved but nonetheless he tells me that it is what it is. Fortunately he does seem to be doing better he tells me that her daughter from the [place] is actually down helping to take care of them out as he and his wife while he is recovering. 08/08/2020 upon evaluation today patient appears to be doing well currently in regard to his leg ulcers. He has been tolerating the dressing changes without complication. Fortunately there is no signs of active infection at this time. No fevers, chills, nausea, vomiting, or diarrhea. 09/26/2020 upon evaluation today patient appears to be doing well with regard to his wound. He has been showing signs of good improvement which is great news and overall I am extremely pleased with where things stand today. I do think that he does well with his dressing changes and overall has really improved significantly here. segment how that how she doing Electronic Signature(s) Signed: 09/26/2020 1:17:39 PM By: Worthy Keeler PA-C Entered By: Worthy Keeler on 09/26/2020 13:17:39 -------------------------------------------------------------------------------- Physical Exam Details Patient Name: Date of  Service: Carlos White 09/26/2020 12:30 PM Medical Record Number: [id number] Patient Account Number: [id number] Date of Birth/Sex: Treating RN: [birth year] (80 y.o. [name] Primary Care Provider: [name] Other Clinician: Referring Provider: Treating Provider/Extender: [name] in Treatment: 58 Constitutional Well-nourished and well-hydrated in no acute distress. Respiratory normal breathing without difficulty. Psychiatric this patient is able to make decisions and demonstrates good insight into disease process. Alert and Oriented x 3. pleasant and cooperative. Notes Upon inspection patient's wound bed actually showed signs of good granulation and epithelization at this point. Fortunately there does not appear to be any evidence of significant infection which is great and overall I am extremely pleased with where things stand today. No fevers, chills, nausea, vomiting,  or diarrhea. Electronic Signature(s) Signed: 09/26/2020 1:32:45 PM By: Worthy Keeler PA-C Entered By: Worthy Keeler on 09/26/2020 13:32:45 -------------------------------------------------------------------------------- Physician Orders Details Patient Name: Date of Service: Carlos White, Carlos White. 09/26/2020 12:30 PM Medical Record Number: [id number] Patient Account Number: [id number] Date of Birth/Sex: Treating RN: [birth year] (80 y.o. 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 - on days that dressing is changed Edema Control - Lymphedema / SCD / Other Bilateral Lower Extremities Elevate legs to the level of the heart or above for 30 minutes daily and/or when sitting, a frequency of: - throughout the day Avoid standing for long periods of time. Exercise regularly Moisturize legs daily. Compression stocking or Garment 20-30 mm/Hg pressure to: - Juxtalite to both legs daily Wound Treatment Wound #2 - Lower Leg Wound Laterality: Left, Lateral, Distal Cleanser: Soap and Water 3 x Per Week/30 Days Discharge Instructions: May shower and wash wound with dial antibacterial soap and water prior to dressing change. Peri-Wound Care: Zinc Oxide Ointment 30g tube 3 x Per Week/30 Days Discharge Instructions: Apply Zinc Oxide to periwound with each dressing change as needed for maceration Peri-Wound Care: Sween Lotion (Moisturizing lotion) 3 x Per Week/30 Days Discharge Instructions: Apply moisturizing lotion as directed Prim Dressing: Fibracol Plus Dressing, 4x4.38 in (collagen) (Dispense As Written) 3 x Per Week/30 Days ary Discharge Instructions: Moisten collagen with saline or hydrogel Secondary Dressing: ABD Pad, 5x9 (Generic) 3 x Per Week/30 Days Discharge Instructions: Apply over primary dressing as directed. Secondary Dressing: Zetuvit Plus 4x8 in 3 x Per Week/30 Days Discharge Instructions: Apply over primary dressing as directed. Secondary Dressing: Xtrasorb Classic Super Absorbent Dressing, 6x9 (in/in) (Dispense As Written) 3 x Per Week/30 Days Discharge Instructions: Apply over primary dressing as directed. Secured With: Elastic Bandage 4 inch (ACE bandage) 3 x Per Week/30 Days Discharge Instructions: Secure with ACE bandage as directed. Secured With: [employer], 4.5x3.1 (in/yd) (Generic) 3 x Per Week/30 Days Discharge Instructions: Secure with Kerlix as directed. Wound #5 - Lower Leg Wound Laterality: Right, Lateral Cleanser: Soap and Water 3 x Per Week/30 Days Discharge Instructions: May shower and wash wound with dial antibacterial soap and water prior to dressing change. Peri-Wound Care: Zinc Oxide Ointment 30g tube 3 x Per Week/30 Days Discharge Instructions: Apply Zinc Oxide to periwound with each dressing change as needed for maceration Peri-Wound Care: Sween Lotion (Moisturizing lotion) 3 x Per Week/30 Days Discharge Instructions: Apply moisturizing lotion as directed Prim Dressing: Fibracol Plus Dressing, 4x4.38 in (collagen) (Dispense As Written) 3 x Per Week/30 Days ary Discharge Instructions: Moisten collagen with saline or hydrogel Secondary Dressing: ABD Pad, 5x9 (Generic) 3 x Per Week/30 Days Discharge Instructions: Apply over primary dressing as directed. Secondary Dressing: Zetuvit Plus 4x8 in 3 x Per Week/30 Days Discharge Instructions: Apply over primary dressing as directed. Secondary Dressing: Xtrasorb Classic Super Absorbent Dressing, 6x9 (in/in) (Dispense As  Written) 3 x Per Week/30 Days Discharge Instructions: Apply over primary dressing as directed. Secured With: Elastic Bandage 4 inch (ACE bandage) 3 x Per Week/30 Days Discharge Instructions: Secure with ACE bandage as directed. Secured With: [employer], 4.5x3.1 (in/yd) (Generic) 3 x Per Week/30 Days Discharge Instructions: Secure with Kerlix as directed. Electronic Signature(s) Signed: 09/26/2020 5:28:57 PM By: Worthy Keeler PA-C Signed: 09/26/2020 6:35:12 PM By: Baruch Gouty RN, BSN Entered By: Baruch Gouty on 09/26/2020 13:17:34 -------------------------------------------------------------------------------- Problem List Details Patient Name: Date of Service: Carlos White, Carlos White. 09/26/2020 12:30 PM Medical Record Number:  [id number] Patient Account Number: [id number] Date of Birth/Sex: Treating RN: [birth year] (80 y.o. 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 09/30/2017 No Yes L97.322 Non-pressure chronic ulcer of left ankle with fat layer exposed 09/30/2017 No Yes L97.822 Non-pressure chronic ulcer of other part of left lower leg with fat layer exposed9/18/2019 No Yes L97.812 Non-pressure chronic ulcer of other part of right lower leg with fat layer 09/30/2017 No Yes exposed I10 Essential (primary) hypertension 09/30/2017 No Yes Inactive Problems Resolved Problems Electronic Signature(s) Signed: 09/26/2020 1:12:00 PM By: Worthy Keeler PA-C Entered By: Worthy Keeler on 09/26/2020 13:12:00 -------------------------------------------------------------------------------- Progress Note Details Patient Name: Date of Service: Carlos White, Carlos White. 09/26/2020 12:30 PM Medical Record Number: [id number] Patient Account Number: [id number] Date of Birth/Sex: Treating RN: 03-Jun-1940 (80 y.o. [name] Primary Care Provider: [name] Other Clinician: Referring Provider: Treating Provider/Extender: [name] in Treatment: 156 Subjective Chief Complaint Information obtained from Patient Bilateral LE Ulcers History of Present Illness (HPI) 09/30/17 on evaluation today patient presents for initial evaluation and our clinic concerning issues that he has been having with his bilateral lower extremities. He states this has been going on for quite some time at least six months. Currently his regiment has been mainly cleaning the area with peroxide, applying the is foreign  ointment, and wrapping the area with ABD pads and then an ace wrap loosely. He has dealt with issues of this nature he tells me for quite some time. He does have a history of having had a compound fracture of the left lower extremity which he thinks also makes this a much more difficult area for him to heal. He's previously been told that he had poor vascular flow but this was years ago at [facility] we do not have any of those records at this time. He has a history of Epidermolysis Bullosa which was diagnosed around age 86 and he has been cared for at [facility] since that time. Subsequently he states this is hereditary and two of his children one male and one male also have this as well is one of his grandchildren that he is aware of. He has no evidence of infection necessarily at this point although he does have some necrotic tissue noted on the surface of the wound as far as the largest, left lateral lower extremity ulcer, is concerned. Overall I feel like all things considered he's been taking care of this very well. Obviously he has some fairly significant issues going on at this point in this regard. He does have a history otherwise of hypertension though for the most part other than the compound fracture of the  left leg he seems to have been fairly healthy in my pinion. 10/07/17 on evaluation today patient actually appears to be doing better in regard to his bilateral lower extremity ulcers. With that being said he does still have some evidence of slough noted on the surface of the wounds I think the Iodoflex has been beneficial for him. His arterial studies are scheduled for October 2. With that being said I do believe that he is continuing to show signs of good improvement which is at least good news. 10/14/17 on evaluation today patient appears to be doing very well in regard to his lower extremity ulcers. He's definitely made some progress as far as healing is concerned although there still are  several open areas that are going to need to be addressed. He did have his arterial study today which fortunately shows good findings with a right ABI of 1.23 with a TBI of 0.86 in the left ABI of 1.28 with a TBI of 0.81. This is good news and will allow [place] to perform debridement as well. 10/23/2017; patient with a large wound on the left lateral calf, sizable area on the left medial malleolus and an area on the right lateral malleolus. He has a new blister consistent with his underlying blistering skin disease just above this area we have been using Iodoflex on the lateral left calf lateral right ankle and collagen on the medial left ankle. We have been using Kerlix Coban wraps 10/28/17 on evaluation today the patient continues to have signs of improvement in regard to the overall appearance of the original wound. Unfortunately he did have some blistering over the right lateral lower extremity which has appeared to rupture on evaluation today and likely some of the dead tissue on the surface needs to be cleaned away the good news is this does not appear to be to significantly deep at this time. 11/04/17 on evaluation today patient actually appears to be doing a little better in regard to his lower extremity ulcers. He has been tolerating the dressing changes without complication. With that being said he does still have a significant wound especially over the left lateral lower extremity unfortunately. All of the wounds pretty much are going to require sharp debridement today. 11/11/17 on evaluation today patient appears to be doing more poorly in regard to his left lower extremity in particular. There does not appear to be any evidence of systemic infection although the wound itself as far as the larger left lateral lower extremity ulcer actually appears to be infected in my pinion. There's an older and the surface of the wound is dramatically worsened compared to last week. No fevers, chills, nausea,  or vomiting noted at this time. 11/18/17 upon evaluation today patient actually appears to be doing better. I did review his culture today which really did not show any specific organism is a positive reason for his wound decline. There are multiple organisms present not predominant. Nonetheless he seems to be tolerate the doxycycline well in his wounds in general do seem to be doing better. Fortunately there does not appear to be any evidence of infection at this time which is good news. Overall I'm very pleased with how things appear. Nonetheless he still has a lot of healing to Carlos. I do think he could benefit from a Juxta-Lite wrap. 11/25/17 on evaluation today patient actually appears to be doing fairly well in regard to his wounds. He is still taking the antibiotics he has a few days left.  Fortunately this seems to have been excellent for him as far as getting the infection control and very happy in this regard. With that being said the patient likewise is also very pleased with how things appear at this time in comparison to where we were he's not having as much pain. 12/02/17 Seen today for follow p and management of LLE wounds. Wounds appear to show some improvement. He denies pain, fever, or chills. Completed a course of doxycycline earlier this month. Scheduled to received Juxta-Lite wrap this week. No s/s of infections. 12/09/17 on evaluation today patient actually appears to be doing a little bit better in regard to his wounds. This is obscene very slow process and unfortunately he has a couple of new areas and this is due to the Epidermolysis Bullosa. Nonetheless I am concerned about the fact that he seems to be getting more areas not less that is the reason we're gonna work on getting schedule for the vascular referral to see the venous specialist. 12/23/17 upon evaluation today patient's wounds currently shows evidence of still not doing quite as well is what I would like to have seen.  Subsequently the patient did have his venous study which showed evidence of venous stasis. Subsequently I do think that a vascular evaluation for consideration of venous intervention would be appropriate. I'm not necessarily suggesting that will be anything that can be done but I think it is at least a good idea. He is in agreement with this plan. 12/30/17 on evaluation today patient actually appears to be doing very well in regard to his wounds when compared to previous evaluation. Subsequently we have been using the [facility] Dressing which actually appears to have done excellent on his left lateral lower extremity ulcer. The quality of the wound surface is dramatically improved. There is some slight debridement that is going to be required at a couple of locations but overall I'm extremely pleased with how things appear here. 01/07/2018; this is a patient with a primary skin disorder epidermolyis bullosa. Is a large wound on the left lateral calf and smaller wounds on the right however there is a new wound on the right mid tibia area that occurred within the compression wrap that he did not change. We have been using Hydrofera Blue. On the left he is using Hydrofera Blue and Santyl to the inferior part of the wound and changing the dressing himself. 01/15/2018; primary skin disorder epidermolysis bullosa. He has several difficult wounds including the left lateral calf, smaller wounds on the left medial calf and the right lateral calf. The major area on the left lateral calf has a smaller area inferiorly that has necrotic debris we have been using Santyl to this. The rest of the wounds we have been using Hydrofera Blue. The area on the left calf actually looks larger this week. Uncontrolled edema several small open areas above it that are superficial 01/20/18 on evaluation today patient appears to be doing better as compared to last week in regard to his wounds of the bilateral lower extremities.  He tolerated the bilateral compression wrap without complication. Overall I'm very pleased with how things appear at this time. The patient likewise is very happy. 01/27/18 on evaluation today patient appears to be doing decently well in regard to his bilateral lower extremity ulcers. He's been tolerating the dressing changes without complication. One issue he had is that he did have more drainage to the left leg wrapped last week. He states in fact he  probably should come in and let us change it on Friday however he just left it in place and kept adding extra absorption with ABD pads to the external portion of the wrap. Unfortunately he does have some aspiration type breakdown nothing significant but I do believe that this was probably counterproductive in general. Nonetheless his wounds do not appear to be terrible overall. 02/03/18 on evaluation today patient appears to be doing rather well in regard to his lower extremity ulcers. He has been tolerating the dressing changes without complication. He does tell me that he had to change the wrap on the left one since we last saw him. Subsequently I do not see any evidence of infection I do feel like the food was much better controlled at this point. 02/10/18 on evaluation today patient appears to be doing rather well in regard to his ulcers. He still has significant alterations especially on the left lateral lower extremity. Fortunately there's no signs of infection at this time. Overall I feel like he is making good progress are some areas that I'm gonna attempt some debridement today. 02/17/18 on evaluation today patient appears to be doing okay in regard to his lower Trinity ulcer. It does appear on both locations he has a little bit of drainage causing some breakdown in maceration around the wound bed's although it doesn't appear to be too bad the right is a little bit worse than left. Fortunately there's no signs of infection which is good news. No  fevers, chills, nausea, or vomiting noted at this time. 03/10/18 on evaluation today patient actually appears to be doing rather poorly in regard to his bilateral lower extremity ulcers. The right in particular is draining profusely and the wound is actually enlarging which is not good. I'm concerned about both possibly infection and the fact that there's a lot of moisture which is causing breakdown as well. Unfortunately the patient has been trying to change this at home I'm afraid he may need to change more frequently in order to see the improvement that were looking for. There's no signs of systemic infection. 03/17/18 patient actually appears to be doing significantly better at this point in regard to his bilateral lower extremity ulcers. Fortunately there's no signs of infection. That is worsening infection at least indefinitely nothing systemic. With that being said he is having a lot of drainage though not quite as much is there in his last evaluation. Overall feel like he's on the side of improvement. I think if his results back from his culture which showed that he had a positive group B strep along with abundant Pseudomonas noted on the culture. For that reason I am gonna have him continue with the linezolid as we previously have ordered for him and I did Carlos ahead as well today and prescribe Levaquin as well in order to treat the Pseudomonas portion of the infection noted. 03/24/18 on evaluation today patient actually appears to be doing very well in regard to his lower Trinity ulcer. He's been tolerating the dressing changes without complication. Fortunately both legs show signs of less drainage in his edema is very well controlled at this point as well. Overall very pleased with how things seem to be progressing. 03/31/18 on evaluation today patient actually appears to be doing excellent in regard to his bilateral lower extremity ulcers. These are not draining nearly as significant as what they  were in the past overall seem to be shown signs of excellent improvement which is great news.  Fortunately there is no sign of active infection at this time I do believe that the Levaquin has done extremely well for him in this regard. The patient continues to change these at home typically every day. We may be able to slowly work towards every other day changes since the drainage seems to be slowing down quite significantly. 04/14/18 on evaluation today patient appears to be doing well in regard to his bilateral lower extremities. Let me [facility] Almost completely healed which is excellent news. Fortunately he's shown signs of improvement all other sites as well with new skin growth there's some slight hyper granular tissue but for the most part this seems to be well maintained with the [facility] Dressing. I'm very happy in this regard. 04/28/18 on evaluation today patient appears to be doing rather well in regard to his ulcers of the bilateral lower extremities all things considering. He continues to make some progress as far as new skin growth. There still some hyper granulation noted at this point despite the use of the [facility] Dressing. This is not terrible but I think we may want to consider conclude cauterization today with silver nitrate to try to help knock some of his back as well as helping with any biofilm on the surface of the wound. 05/12/18 on evaluation today patient's wounds actually appear to be doing fairly well in regard to the bilateral lower extremities. He's been tolerating the dressing changes without complication. Fortunately there's no signs of active infection at this time which is good news. Overall very pleased with how things seem to be progressing. You select silver nitrate was beneficial for him. 05/26/18 on evaluation today patient appears to be doing better in regard to left lower extremity and a little bit worse in regard to the right lower extremity.  He states that he was pulling off the [facility] Dressing peel back some of the skin making this area significantly larger than what it was previous. He's not had any issues other than this and states even that hasn't caused any pain he just seems to obviously have a much larger area on the right when compared to the previous time I saw him. No fevers, chills, nausea, or vomiting noted at this time. 06/16/18 on evaluation today patient actually appears to be doing a little better in my pinion in regard to his lower summary ulcers. He has new skin islands that seem to be spreading which is good news. Fortunately there's no signs of active infection at this time. His biggest issue is he tells me that coming as often as he does is becoming very cost prohibitive. He wonders if we can potentially spread this out. 07/14/18 on evaluation today patient appears to be doing a little bit worse in regard to his lower from the ulcer. Unfortunately he still continues to have a significant amount of drainage I think we need to do something to try to help this more. He is still somewhat reluctant to Carlos the route of the Wound VAC although that may be the most appropriate thing for him. No fevers, chills, nausea, or vomiting noted at this time. 08/18/2018 on evaluation today patient actually appears to be doing quite well with regard to his bilateral lower extremity ulcers. I do feel like that currently he is making great progress the care max does seem to be doing a great job at helping to control the moisture he has no maceration or skin breakdown. Again this seems to be an  excellent way to Carlos. 1 thing we may want to change is adding collagen to the base of the wound and then the care max over top he is not opposed to this. 09/15/2018 on evaluation today patient appears to be doing well with regard to his bilateral lower extremity ulcers. He is showing some signs of improvement not necessarily in size but definitely in  appearance. In fact he has a lot of new skin growing throughout the wounds along the edges as well as in the central portion of the wounds on both lower extremities. Overall I am extremely pleased to see this. 10/20/2018 on evaluation today patient actually appears to be doing quite well with regard to his wounds. They are not measuring significantly smaller but he does have a lot of new epithelization noted as compared to previous. Fortunately there is no signs of active infection at this time. No fevers, chills, nausea, vomiting, or diarrhea. 11/17/2018 on evaluation today patient presents for reevaluation concerning his bilateral lower extremity ulcers. Fortunately there is no signs of active infection at this time today. He has been tolerating the dressing changes without complication. No fevers, chills, nausea, vomiting, or diarrhea. Unfortunately in general the patient has not made as much improvement as I would like to have seen up to this point. He has been tolerating the dressing changes without complication and he does an excellent job taking care of his wounds at home in my opinion. The biggest issue I see is that he is just not making the progress that we need to be seeing currently. I think we may want to consider having him seen at a plastic surgery appointment and he has previously seen someone in years past at [facility] in [place]. That is definitely a possibility for [place] to look into at this point. 12/29/2018 on evaluation today patient appears to be doing better in regard to the overall visual appearance of his wounds which do not appear to be as macerated. He does have a much larger skin island in the middle of the left lower extremity ulcer which is doing much better. He tells me the pain is also significantly better. With that being said overall his improvement as far as the size of the wounds is not better but again these are very irregular in change shape  quite often. Fortunately there is no evidence of active infection at this time which is great news. He never heard anything from [name] regarding the plastic surgery referral that we made to them. 01/26/2019 upon evaluation today patient appears to be doing a little better in regard to his wounds today. He has been tolerating the dressing changes again he performs these for the most part on his own. He does a great job wrapping his legs in my opinion. Unfortunately he has not been able to get down to [facility] to see if there is anything from a plastic surgery standpoint that could be done to help with his legs simply due to the fact that his wife unfortunately sustained a compression fracture in her spine she is seeing Dr. Saintclair Halsted and subsequently is going to be having what sounds to be a kyphoplasty type procedure. With that being said that has not been scheduled yet there is still waiting on an MRI the patient is very busy in fact overly busy trying to help take care of his wife at this point. I completely understand this is more of a strain on him at this time 02/23/2019  upon evaluation today patient actually appears to be making some progress. I am actually very pleased with the overall appearance of his wounds even compared to last evaluation. He seems to be doing quite well. He is taking care of his wife unfortunately she did have a compression fracture she has had the procedure for this but still she has a slow road to recovery. For that reason he still not gone to [facility] for a second opinion in this regard. Obviously the goal there was if there was anything that can be done from a skin graft standpoint or otherwise. 03/23/2019 upon evaluation today patient continues to have issues with lower extremity ulcers. Since the beginning he has made progress but at the same time the wounds unfortunately just will not close. We have been trying to get the patient to [facility] to see a  specialist there but unfortunately with the everything going on with his wife he has not been able to make that appointment time yet he states he may be able to in the next 1-2 months but is not really sure. 04/27/2019 on evaluation today patient appears to be doing a little bit more poorly. His last evaluation. He appears to have some erythema around the edges of the wound at this point. Fortunately there is no signs of active infection at this time which is good news. No fevers, chills, nausea, vomiting, or diarrhea. 06/08/2019 on evaluation today patient appears to be doing well with regard to his wounds. Overall they are actually measuring smaller compared to the last visit last month. We did treat him for Pseudomonas as well as methicillin-resistant Staph aureus. He was only on the treatment for MRSA however for 7 days as the Cipro was resistant and subsequently we had to place him on doxycycline. Nonetheless I am thinking that we may want to add the doxycycline and just do a month-long treatment considering the longstanding nature of his wounds and see if we get this under better control. 07/13/2019 upon evaluation today patient appears to be doing fairly well in regard to his bilateral lower extremities. There does not appear to be any signs of active infection which is good news. No fevers, chills, nausea, vomiting, or diarrhea. 08/10/2019 upon evaluation today patient appears to be doing about the same with regard to his wounds in general. Unfortunately he is not significantly better although is also not significantly worse which is great news there is no evidence of active infection at this time which is good news. He still dealing with a lot going on with his wife and therefore is not really able to Carlos see anyone at the specialty clinic at [facility] that we have previously set up still. 09/07/2019 on evaluation today patient appears to be doing well with regard to his wounds. In fact this is  probably the best that have seen so far in quite a few months. Overall I am very pleased with where things stand at this time. No fevers, chills, nausea, vomiting, or diarrhea. 10/05/2019 upon evaluation today patient appears to be doing more poorly in regard to his legs at this point. He actually is showing some signs of infection. This has been something that we seem to be back-and-forth with as far as trying to keep these wounds from becoming infected. He takes care of them very well in my opinion but nonetheless I am concerned in this regard. He tells me that his wife is still doing really about the same  she is slowly getting better. Nonetheless he still spends the majority of his time helping to take care of her. 11/02/2019 upon evaluation today patient actually appears to be doing somewhat better in regard to his legs. I do believe that the compounded antibiotic treatment from [facility] has been beneficial for him. Overall I am extremely pleased with where things stand today. There is no signs of active infection at this time. The patient states he has much less drainage than he has in the past. 12/07/2019 upon evaluation today patient appears to be doing well at this time in regard to the overall appearance of his wound bed. Currently there is no signs of active infection at this time. 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 Especially when he stressed. Fortunately there is no signs of active infection locally or systemically at this point. 01/18/2020 on evaluation today patient appears to be doing well with regard to his leg ulcers. He has been tolerating the dressing changes without complication. Fortunately there is no signs of infection and overall I feel like his legs are doing about the best they have done in quite some time.  There does not appear to be any evidence of active infection which is great news and overall very pleased. 02/29/2020 upon evaluation today patient's wounds actually appear to be doing quite well currently. There is no sign of active infection at this time. No fevers, chills, nausea, vomiting, or diarrhea. 04/11/2025 on evaluation today patient appears to be doing excellent in regard to his wounds on the legs to be honest. He has made a lot of progress there does not appear to be any signs of active infection and overall I think that he is better than previous although still the wounds are quite significant obviously. In general I think that he is pleased with where things stand at this point. Still there is quite a bit of work to do here. 07/13/2020 upon evaluation today patient appears to be doing well with regard to his wound. Is been tolerating the dressing changes without complication on both legs. He just has the 2 main wounds on each leg at the ankle on the medial region of the right leg is completely healed. His wounds do seem to have gotten better during the time that he has been on bedrest as result of the pelvis fracture. Obviously does not the way that we wanted to see things improved but nonetheless he tells me that it is what it is. Fortunately he does seem to be doing better he tells me that her daughter from the [place] is actually down helping to take care of them out as he and his wife while he is recovering. 08/08/2020 upon evaluation today patient appears to be doing well currently in regard to his leg ulcers. He has been tolerating the dressing changes without complication. Fortunately there is no signs of active infection at this time. No fevers, chills, nausea, vomiting, or diarrhea. 09/26/2020 upon evaluation today patient appears to be doing well with regard to his wound. He has been showing signs of good improvement which is great news and overall I am extremely pleased with where  things stand today. I do think that he does well with his dressing changes and overall has really improved significantly here. segment how that how she  doing Objective Constitutional Well-nourished and well-hydrated in no acute distress. Vitals Time Taken: 12:55 PM, Height: 71 in, Weight: 220 lbs, BMI: 30.7, Temperature: 98.1 F, Pulse: 74 bpm, Respiratory Rate: 20 breaths/min, Blood Pressure: 155/90 mmHg. Respiratory normal breathing without difficulty. Psychiatric this patient is able to make decisions and demonstrates good insight into disease process. Alert and Oriented x 3. pleasant and cooperative. General Notes: Upon inspection patient's wound bed actually showed signs of good granulation and epithelization at this point. Fortunately there does not appear to be any evidence of significant infection which is great and overall I am extremely pleased with where things stand today. No fevers, chills, nausea, vomiting, or diarrhea. Integumentary (Hair, Skin) Wound #18 status is Healed - Epithelialized. Original cause of wound was Gradually Appeared. The date acquired was: 04/11/2020. The wound has been in treatment 24 weeks. The wound is located on the Right,Proximal,Lateral Lower Leg. The wound measures 0cm length x 0cm width x 0cm depth; 0cm^2 area and 0cm^3 volume. There is no tunneling or undermining noted. There is a none present amount of drainage noted. The wound margin is flat and intact. There is no granulation within the wound bed. There is no necrotic tissue within the wound bed. Wound #2 status is Open. Original cause of wound was Gradually Appeared. The date acquired was: 03/13/2017. The wound has been in treatment 156 weeks. The wound is located on the Left,Distal,Lateral Lower Leg. The wound measures 13.9cm length x 11.5cm width x 0.1cm depth; 125.546cm^2 area and 12.555cm^3 volume. There is Fat Layer (Subcutaneous Tissue) exposed. There is no tunneling or undermining noted. There  is a large amount of serosanguineous drainage noted. The wound margin is flat and intact. There is large (67-100%) red granulation within the wound bed. There is no necrotic tissue within the wound bed. Wound #5 status is Open. Original cause of wound was Gradually Appeared. The date acquired was: 10/26/2017. The wound has been in treatment 152 weeks. The wound is located on the Right,Lateral Lower Leg. The wound measures 5cm length x 8cm width x 0.1cm depth; 31.416cm^2 area and 3.142cm^3 volume. There is Fat Layer (Subcutaneous Tissue) exposed. There is no tunneling or undermining noted. There is a large amount of serosanguineous drainage noted. The wound margin is flat and intact. There is large (67-100%) red granulation within the wound bed. There is no necrotic tissue within the wound bed. 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 - on days that dressing is changed Edema Control - Lymphedema / SCD / Other: Elevate legs to the level of the heart or above for 30 minutes daily and/or when sitting, a frequency of: - throughout the day Avoid standing for long periods of time. Exercise regularly Moisturize legs daily. Compression stocking or Garment 20-30 mm/Hg pressure to: - Juxtalite to both legs daily WOUND #2: - Lower Leg Wound Laterality: Left, Lateral, Distal Cleanser: Soap and Water 3 x Per Week/30 Days Discharge Instructions: May shower and wash wound with dial antibacterial soap and water prior to dressing change. Peri-Wound  Care: Zinc Oxide Ointment 30g tube 3 x Per Week/30 Days Discharge Instructions: Apply  Zinc Oxide to periwound with each dressing change as needed for maceration Peri-Wound Care: Sween Lotion (Moisturizing lotion) 3 x Per Week/30 Days Discharge Instructions: Apply moisturizing lotion as directed Prim Dressing: Fibracol Plus Dressing, 4x4.38 in (collagen) (Dispense As Written) 3 x Per Week/30 Days ary Discharge Instructions: Moisten collagen with saline or hydrogel Secondary Dressing: ABD Pad, 5x9 (Generic) 3 x Per Week/30 Days Discharge Instructions: Apply over primary dressing as directed. Secondary Dressing: Zetuvit Plus 4x8 in 3 x Per Week/30 Days Discharge Instructions: Apply over primary dressing as directed. Secondary Dressing: Xtrasorb Classic Super Absorbent Dressing, 6x9 (in/in) (Dispense As Written) 3 x Per Week/30 Days Discharge Instructions: Apply over primary dressing as directed. Secured With: Elastic Bandage 4 inch (ACE bandage) 3 x Per Week/30 Days Discharge Instructions: Secure with ACE bandage as directed. Secured With: [employer], 4.5x3.1 (in/yd) (Generic) 3 x Per Week/30 Days Discharge Instructions: Secure with Kerlix as directed. WOUND #5: - Lower Leg Wound Laterality: Right, Lateral Cleanser: Soap and Water 3 x Per Week/30 Days Discharge Instructions: May shower and wash wound with dial antibacterial soap and water prior to dressing change. Peri-Wound Care: Zinc Oxide Ointment 30g tube 3 x Per Week/30 Days Discharge Instructions: Apply Zinc Oxide to periwound with each dressing change as needed for maceration Peri-Wound Care: Sween Lotion (Moisturizing lotion) 3 x Per Week/30 Days Discharge Instructions: Apply moisturizing lotion as directed Prim Dressing: Fibracol Plus Dressing, 4x4.38 in (collagen) (Dispense As Written) 3 x Per Week/30 Days ary Discharge Instructions: Moisten collagen with saline or hydrogel Secondary Dressing: ABD Pad, 5x9 (Generic) 3 x Per  Week/30 Days Discharge Instructions: Apply over primary dressing as directed. Secondary Dressing: Zetuvit Plus 4x8 in 3 x Per Week/30 Days Discharge Instructions: Apply over primary dressing as directed. Secondary Dressing: Xtrasorb Classic Super Absorbent Dressing, 6x9 (in/in) (Dispense As Written) 3 x Per Week/30 Days Discharge Instructions: Apply over primary dressing as directed. Secured With: Elastic Bandage 4 inch (ACE bandage) 3 x Per Week/30 Days Discharge Instructions: Secure with ACE bandage as directed. Secured With: [employer], 4.5x3.1 (in/yd) (Generic) 3 x Per Week/30 Days Discharge Instructions: Secure with Kerlix as directed. 1. Would recommend that we Carlos ahead and continue with the wound care measures as before and the patient is in agreement with plan. This includes the use of the femoral call collagen dressing followed by ABD pads and then Zetuvit and/or XtraSorb. 2. I am also going to recommend that he secured this with roll gauze. 3. We will also continue to use his juxta lite over top of this to secure in place and help with edema control. We will see patient back for reevaluation in 1 week here in the clinic. If anything worsens or changes patient will contact our office for additional recommendations. Electronic Signature(s) Signed: 09/26/2020 1:33:36 PM By: Worthy Keeler PA-C Entered By: Worthy Keeler on 09/26/2020 13:33:36 -------------------------------------------------------------------------------- SuperBill Details Patient Name: Date of Service: Carlos White, Carlos RDO [name] 09/26/2020 Medical Record Number: [id number] Patient Account Number: [id number] Date of Birth/Sex: Treating RN: [id number] (80 y.o. [name] Primary Care Provider: [name] Other Clinician: Referring Provider: Treating Provider/Extender: [name] in Treatment: 156 Diagnosis Coding ICD-10 Codes Code Description I87.2 Venous insufficiency  (chronic) (peripheral) Q81.8 Other epidermolysis bullosa L97.322 Non-pressure chronic ulcer of left ankle with fat layer exposed L97.822 Non-pressure chronic ulcer of other part of left lower leg with fat layer exposed L97.812 Non-pressure chronic ulcer of other part of right  lower leg with fat layer exposed [facility] (primary) hypertension Facility Procedures CPT4 Code: [id number] Description: 99214 - WOUND CARE VISIT-LEV 4 EST PT Modifier: Quantity: 1 Physician Procedures : CPT4 Code Description Modifier [id number] - WC PHYS LEVEL 3 - EST PT ICD-10 Diagnosis Description I87.2 Venous insufficiency (chronic) (peripheral) Q81.8 Other epidermolysis bullosa L97.322 Non-pressure chronic ulcer of left ankle with fat layer  exposed L97.822 Non-pressure chronic ulcer of other part of left lower leg with fat layer exposed Quantity: 1 Electronic Signature(s) Signed: 09/26/2020 1:33:58 PM By: Worthy Keeler PA-C Entered By: Worthy Keeler on 09/26/2020 13:33:58

## 2020-09-26 NOTE — Progress Notes (Signed)
Carlos, White ([id number]) Visit Report for 09/26/2020 Arrival Information Details Patient Name: Date of Service: Carlos White 09/26/2020 12:30 PM Medical Record Number: [id number] Patient Account Number: [id number] Date of Birth/Sex: Treating RN: Jul 08, 1940 (80 y.o. Carlos White, [name] Primary Care Loreal Schuessler: [name] Other Clinician: Referring Uzziah Rigg: Treating Carlos White/Extender: [name] in Treatment: 32 Visit Information History Since Last Visit Added or deleted any medications: No Patient Arrived: [name] Any new allergies or adverse reactions: No Arrival Time: 12:55 Had a fall or experienced change in No Accompanied By: self activities of daily living that may affect Transfer Assistance: None risk of falls: Patient Identification Verified: Yes Signs or symptoms of abuse/neglect since last visito No Secondary Verification Process Completed: Yes Hospitalized since last visit: No Patient Requires Transmission-Based Precautions: No Implantable device outside of the clinic excluding No Patient Has Alerts: Yes cellular tissue based products placed in the center Patient Alerts: R ABI= 1.23, TBI = .86 since last visit: L ABI= 1.28, TBI=.81 Has Dressing in Place as Prescribed: Yes Has Compression in Place as Prescribed: Yes Pain Present Now: No Electronic Signature(s) Signed: 09/26/2020 6:11:22 PM By: [name] RN, BSN Entered By: [name] on 09/26/2020 12:56:07 -------------------------------------------------------------------------------- Clinic Level of Care Assessment Details Patient Name: Date of Service: Carlos White 09/26/2020 12:30 PM Medical Record Number: [id number] Patient Account Number: [id number] Date of Birth/Sex: Treating RN: 1940/12/05 (80 y.o. Carlos White Primary Care Alastor Kneale: [name] Other Clinician: Referring Rane Dumm: Treating Carlos White/Extender: [name] in Treatment:  156 Clinic Level of Care Assessment Items TOOL 4 Quantity Score []  - 0 Use when only an EandM is performed on FOLLOW-UP visit ASSESSMENTS - Nursing Assessment / Reassessment X- 1 10 Reassessment of Co-morbidities (includes updates in patient status) X- 1 5 Reassessment of Adherence to Treatment Plan ASSESSMENTS - Wound and Skin A ssessment / Reassessment []  - 0 Simple Wound Assessment / Reassessment - one wound X- 3 5 Complex Wound Assessment / Reassessment - multiple wounds []  - 0 Dermatologic / Skin Assessment (not related to wound area) ASSESSMENTS - Focused Assessment X- 2 5 Circumferential Edema Measurements - multi extremities []  - 0 Nutritional Assessment / Counseling / Intervention X- 1 5 Lower Extremity Assessment (monofilament, tuning fork, pulses) []  - 0 Peripheral Arterial Disease Assessment (using hand held doppler) ASSESSMENTS - Ostomy and/or Continence Assessment and Care []  - 0 Incontinence Assessment and Management []  - 0 Ostomy Care Assessment and Management (repouching, etc.) PROCESS - Coordination of Care X - Simple Patient / Family Education for ongoing care 1 15 []  - 0 Complex (extensive) Patient / Family Education for ongoing care X- 1 10 Staff obtains Programmer, systems, Records, T Results / Process Orders est []  - 0 Staff telephones HHA, Nursing Homes / Clarify orders / etc []  - 0 Routine Transfer to another Facility (non-emergent condition) []  - 0 Routine Hospital Admission (non-emergent condition) []  - 0 New Admissions / Biomedical engineer / Ordering NPWT Apligraf, etc. , []  - 0 Emergency Hospital Admission (emergent condition) X- 1 10 Simple Discharge Coordination []  - 0 Complex (extensive) Discharge Coordination PROCESS - Special Needs []  - 0 Pediatric / Minor Patient Management []  - 0 Isolation Patient Management []  - 0 Hearing / Language / Visual special needs []  - 0 Assessment of Community assistance (transportation, D/C  planning, etc.) []  - 0 Additional assistance / Altered mentation []  - 0 Support Surface(s) Assessment (bed, cushion, seat, etc.) INTERVENTIONS -  Wound Cleansing / Measurement []  - 0 Simple Wound Cleansing - one wound X- 3 5 Complex Wound Cleansing - multiple wounds X- 1 5 Wound Imaging (photographs - any number of wounds) []  - 0 Wound Tracing (instead of photographs) []  - 0 Simple Wound Measurement - one wound X- 3 5 Complex Wound Measurement - multiple wounds INTERVENTIONS - Wound Dressings []  - 0 Small Wound Dressing one or multiple wounds X- 2 15 Medium Wound Dressing one or multiple wounds []  - 0 Large Wound Dressing one or multiple wounds X- 1 5 Application of Medications - topical []  - 0 Application of Medications - injection INTERVENTIONS - Miscellaneous []  - 0 External ear exam []  - 0 Specimen Collection (cultures, biopsies, blood, body fluids, etc.) []  - 0 Specimen(s) / Culture(s) sent or taken to Lab for analysis []  - 0 Patient Transfer (multiple staff / Civil Service fast streamer / Similar devices) []  - 0 Simple Staple / Suture removal (25 or less) []  - 0 Complex Staple / Suture removal (26 or more) []  - 0 Hypo / Hyperglycemic Management (close monitor of Blood Glucose) []  - 0 Ankle / Brachial Index (ABI) - do not check if billed separately X- 1 5 Vital Signs Has the patient been seen at the hospital within the last three years: Yes Total Score: 155 Level Of Care: New/Established - Level 4 Electronic Signature(s) Signed: 09/26/2020 6:35:12 PM By: Baruch Gouty RN, BSN Entered By: Baruch Gouty on 09/26/2020 13:16:13 -------------------------------------------------------------------------------- Encounter Discharge Information Details Patient Name: Date of Service: Carlos White, Carlos RDO N H. 09/26/2020 12:30 PM Medical Record Number: [id number] Patient Account Number: [id number] Date of Birth/Sex: Treating RN: [birth year] (80 y.o. Carlos White Primary Care  Clint Strupp: [name] Other Clinician: Referring Melvyn Hommes: Treating Allyna Pittsley/Extender: [name] in Treatment: [phone] Encounter Discharge Information Items Discharge Condition: Stable Ambulatory Status: Cane Discharge Destination: Home Transportation: Private Auto Accompanied By: self Schedule Follow-up Appointment: Yes Clinical Summary of Care: Patient Declined Electronic Signature(s) Signed: 09/26/2020 6:35:12 PM By: Baruch Gouty RN, BSN Entered By: Baruch Gouty on 09/26/2020 13:28:24 -------------------------------------------------------------------------------- Lower Extremity Assessment Details Patient Name: Date of Service: Carlos White, Carlos RDO N H. 09/26/2020 12:30 PM Medical Record Number: [id number] Patient Account Number: [id number] Date of Birth/Sex: Treating RN: 04-15-1940 (80 y.o. [name] Primary Care Niaomi Cartaya: [name] Other Clinician: Referring Wessley Emert: Treating Shanin Szymanowski/Extender: [name] in Treatment: 156 Edema Assessment Assessed: [Left: Yes] Patrice Paradise: Yes] Edema: [Left: No] [Right: No] Calf Left: Right: Point of Measurement: 35.5 cm From Medial Instep 37 cm 34 cm Ankle Left: Right: Point of Measurement: 11 cm From Medial Instep 27 cm 23.5 cm Vascular Assessment Pulses: Dorsalis Pedis Palpable: [Left:Yes] [Right:Yes] Electronic Signature(s) Signed: 09/26/2020 6:11:22 PM By: [name] RN, BSN Entered By: [name] on 09/26/2020 13:00:07 -------------------------------------------------------------------------------- [place] Details Patient Name: Date of Service: Carlos White, Carlos RDO N H. 09/26/2020 12:30 PM Medical Record Number: [id number] Patient Account Number: [id number] Date of Birth/Sex: Treating RN: 08/30/1940 (80 y.o. Carlos White Primary Care Dorine Duffey: [name] Other Clinician: Referring Aramis Zobel: Treating Terion Hedman/Extender: [name] in Treatment: [place] reviewed with physician Active Inactive Venous Leg Ulcer Nursing Diagnoses: Knowledge deficit related to disease process and management Potential for venous Insuffiency (use before diagnosis confirmed) Goals: Patient will maintain optimal edema control Date Initiated: 09/30/2017 Target Resolution Date: 10/24/2020 Goal Status: Active Patient/caregiver will verbalize understanding of disease process and disease management Date Initiated:  09/30/2017 Date Inactivated: 11/04/2017 Target Resolution Date: 10/30/2017 Goal Status: Met Interventions: Assess peripheral edema status every visit. Provide education on venous insufficiency Notes: Wound/Skin Impairment Nursing Diagnoses: Impaired tissue integrity Knowledge deficit related to ulceration/compromised skin integrity Goals: Patient/caregiver will verbalize understanding of skin care regimen Date Initiated: 09/30/2017 Target Resolution Date: 10/24/2020 Goal Status: Active Ulcer/skin breakdown will have a volume reduction of 30% by week 4 Date Initiated: 09/30/2017 Date Inactivated: 11/04/2017 Target Resolution Date: 10/30/2017 Goal Status: Met Interventions: Assess patient/caregiver ability to obtain necessary supplies Assess patient/caregiver ability to perform ulcer/skin care regimen upon admission and as needed Assess ulceration(s) every visit Provide education on ulcer and skin care Notes: Electronic Signature(s) Signed: 09/26/2020 6:35:12 PM By: Baruch Gouty RN, BSN Entered By: Baruch Gouty on 09/26/2020 13:14:51 -------------------------------------------------------------------------------- Pain Assessment Details Patient Name: Date of Service: Carlos White, Carlos RDO N H. 09/26/2020 12:30 PM Medical Record Number: [id number] Patient Account Number: [id number] Date of Birth/Sex: Treating RN: [birth year] (80 y.o. [name] Primary Care Damarian Priola: [name] Other Clinician: Referring Brad Lieurance: Treating Hassel Uphoff/Extender: [name] in Treatment: 156 Active Problems Location of Pain Severity and Description of Pain Patient Has Paino No Site Locations Rate the pain. Current Pain Level: 0 Pain Management and Medication Current Pain Management: Medication: No Cold Application: No Rest: No Massage: No Activity: No T.E.N.S.: No Heat Application: No Leg drop or elevation: No Is the Current Pain Management Adequate: Adequate How does your wound impact your activities of daily livingo Sleep: No Bathing: No Appetite: No Relationship With Others: No Bladder Continence: No Emotions: No Bowel Continence: No Work: No Toileting: No Drive: No Dressing: No Hobbies: No Electronic Signature(s) Signed: 09/26/2020 6:11:22 PM By: [name] RN, BSN Entered By: [name] on 09/26/2020 12:57:43 -------------------------------------------------------------------------------- Patient/Caregiver Education Details Patient Name: Date of Service: Carlos White 9/14/2022andnbsp12:30 PM Medical Record Number: [id number] Patient Account Number: [id number] Date of Birth/Gender: Treating RN: 1940/11/25 (80 y.o. Carlos White Primary Care Physician: [name] Other Clinician: Referring Physician: Treating Physician/Extender: [name] in Treatment: 156 Education Assessment Education Provided To: Patient Education Topics Provided Venous: Methods: Explain/Verbal Responses: Reinforcements needed, State content correctly Wound/Skin Impairment: Methods: Explain/Verbal Responses: Reinforcements needed, State content correctly Electronic Signature(s) Signed: 09/26/2020 6:35:12 PM By: Baruch Gouty RN, BSN Entered By: Baruch Gouty on 09/26/2020 13:15:15 -------------------------------------------------------------------------------- Wound Assessment Details Patient  Name: Date of Service: Carlos White, Carlos RDO N H. 09/26/2020 12:30 PM Medical Record Number: [id number] Patient Account Number: [id number] Date of Birth/Sex: Treating RN: [birth year] (80 y.o. [name] Primary Care Josecarlos Harriott: [name] Other Clinician: Referring Rael Tilly: Treating Fany Cavanaugh/Extender: [name] in Treatment: 156 Wound Status Wound Number: 18 Primary Vasculopathy Etiology: Wound Location: Right, Proximal, Lateral Lower Leg Wound Status: Healed - Epithelialized Wounding Event: Gradually Appeared Comorbid Cataracts, Chronic Obstructive Pulmonary Disease (COPD), Date Acquired: 04/11/2020 History: Hypertension Weeks Of Treatment: 24 Clustered Wound: No Photos Wound Measurements Length: (cm) Width: (cm) Depth: (cm) Area: (cm) Volume: (cm) 0 % Reduction in Area: 100% 0 % Reduction in Volume: 100% 0 Epithelialization: Large (67-100%) 0 Tunneling: No 0 Undermining: No Wound Description Classification: Full Thickness Without Exposed Support Structures Wound Margin: Flat and Intact Exudate Amount: None Present Foul Odor After Cleansing: No Slough/Fibrino No Wound Bed Granulation Amount: None Present (0%) Exposed Structure Necrotic Amount: None Present (0%) Fascia Exposed: No Fat Layer (Subcutaneous Tissue) Exposed: No Tendon Exposed: No Muscle Exposed: No Joint Exposed: No  Bone Exposed: No Electronic Signature(s) Signed: 09/26/2020 6:11:22 PM By: [name] RN, BSN Entered By: [name] on 09/26/2020 13:05:34 -------------------------------------------------------------------------------- Wound Assessment Details Patient Name: Date of Service: Carlos White, Carlos RDO N H. 09/26/2020 12:30 PM Medical Record Number: [id number] Patient Account Number: [id number] Date of Birth/Sex: Treating RN: October 19, 1940 (80 y.o. [name] Primary Care Eeva Schlosser: [name] Other Clinician: Referring Alyssamarie Mounsey: Treating Orrie Lascano/Extender: [name] in Treatment: 156 Wound Status Wound Number: 2 Primary Venous Leg Ulcer Etiology: Wound Location: Left, Distal, Lateral Lower Leg Wound Status: Open Wounding Event: Gradually Appeared Comorbid Cataracts, Chronic Obstructive Pulmonary Disease (COPD), Date Acquired: 03/13/2017 History: Hypertension Weeks Of Treatment: 156 Clustered Wound: No Photos Wound Measurements Length: (cm) 13.9 Width: (cm) 11.5 Depth: (cm) 0.1 Area: (cm) 125.546 Volume: (cm) 12.555 % Reduction in Area: -62% % Reduction in Volume: 67.6% Epithelialization: Medium (34-66%) Tunneling: No Undermining: No Wound Description Classification: Full Thickness Without Exposed Support Structures Wound Margin: Flat and Intact Exudate Amount: Large Exudate Type: Serosanguineous Exudate Color: red, brown Foul Odor After Cleansing: No Slough/Fibrino No Wound Bed Granulation Amount: Large (67-100%) Exposed Structure Granulation Quality: Red Fascia Exposed: No Necrotic Amount: None Present (0%) Fat Layer (Subcutaneous Tissue) Exposed: Yes Tendon Exposed: No Muscle Exposed: No Joint Exposed: No Bone Exposed: No Treatment Notes Wound #2 (Lower Leg) Wound Laterality: Left, Lateral, Distal Cleanser Soap and Water Discharge Instruction: May shower and wash wound with dial antibacterial soap and water prior to dressing change. Peri-Wound Care Zinc Oxide Ointment 30g tube Discharge Instruction: Apply Zinc Oxide to periwound with each dressing change as needed for maceration Sween Lotion (Moisturizing lotion) Discharge Instruction: Apply moisturizing lotion as directed Topical Primary Dressing Fibracol Plus Dressing, 4x4.38 in (collagen) Discharge Instruction: Moisten collagen with saline or hydrogel Secondary Dressing ABD Pad, 5x9 Discharge Instruction: Apply over primary dressing as directed. Zetuvit Plus 4x8 in Discharge Instruction: Apply over primary dressing as  directed. Xtrasorb Classic Super [employer], 6x9 (in/in) Discharge Instruction: Apply over primary dressing as directed. Secured With Elastic Bandage 4 inch (ACE bandage) Discharge Instruction: Secure with ACE bandage as directed. Kerlix Roll Sterile, 4.5x3.1 (in/yd) Discharge Instruction: Secure with Kerlix as directed. Compression Wrap Compression Stockings Add-Ons Electronic Signature(s) Signed: 09/26/2020 6:11:22 PM By: [name] RN, BSN Entered By: [name] on 09/26/2020 13:06:09 -------------------------------------------------------------------------------- Wound Assessment Details Patient Name: Date of Service: Carlos White, Carlos RDO N H. 09/26/2020 12:30 PM Medical Record Number: [id number] Patient Account Number: [id number] Date of Birth/Sex: Treating RN: [birth year] (80 y.o. [name] Primary Care Jozlynn Plaia: [name] Other Clinician: Referring Evellyn Tuff: Treating Cordarrius Coad/Extender: [name] in Treatment: 156 Wound Status Wound Number: 5 Primary Vasculopathy Etiology: Wound Location: Right, Lateral Lower Leg Wound Status: Open Wounding Event: Gradually Appeared Comorbid Cataracts, Chronic Obstructive Pulmonary Disease (COPD), Date Acquired: 10/26/2017 History: Hypertension Weeks Of Treatment: 152 Clustered Wound: Yes Photos Wound Measurements Length: (cm) 5 Width: (cm) 8 Depth: (cm) 0.1 Area: (cm) 31.416 Volume: (cm) 3.142 % Reduction in Area: 42.1% % Reduction in Volume: 42.1% Epithelialization: Medium (34-66%) Tunneling: No Undermining: No Wound Description Classification: Full Thickness Without Exposed Support Structures Wound Margin: Flat and Intact Exudate Amount: Large Exudate Type: Serosanguineous Exudate Color: red, brown Foul Odor After Cleansing: No Slough/Fibrino No Wound Bed Granulation Amount: Large (67-100%) Exposed Structure Granulation Quality: Red Fascia Exposed: No Necrotic Amount: None  Present (0%) Fat Layer (Subcutaneous Tissue) Exposed: Yes Tendon Exposed: No Muscle Exposed: No Joint Exposed: No Bone Exposed: No  Treatment Notes Wound #5 (Lower Leg) Wound Laterality: Right, Lateral Cleanser Soap and Water Discharge Instruction: May shower and wash wound with dial antibacterial soap and water prior to dressing change. Peri-Wound Care Zinc Oxide Ointment 30g tube Discharge Instruction: Apply Zinc Oxide to periwound with each dressing change as needed for maceration Sween Lotion (Moisturizing lotion) Discharge Instruction: Apply moisturizing lotion as directed Topical Primary Dressing Fibracol Plus Dressing, 4x4.38 in (collagen) Discharge Instruction: Moisten collagen with saline or hydrogel Secondary Dressing ABD Pad, 5x9 Discharge Instruction: Apply over primary dressing as directed. Zetuvit Plus 4x8 in Discharge Instruction: Apply over primary dressing as directed. Xtrasorb Classic Super [employer], 6x9 (in/in) Discharge Instruction: Apply over primary dressing as directed. Secured With Elastic Bandage 4 inch (ACE bandage) Discharge Instruction: Secure with ACE bandage as directed. Kerlix Roll Sterile, 4.5x3.1 (in/yd) Discharge Instruction: Secure with Kerlix as directed. Compression Wrap Compression Stockings Add-Ons Electronic Signature(s) Signed: 09/26/2020 6:11:22 PM By: [name] RN, BSN Entered By: [name] on 09/26/2020 13:05:06 -------------------------------------------------------------------------------- Vitals Details Patient Name: Date of Service: Carlos White, Carlos RDO N H. 09/26/2020 12:30 PM Medical Record Number: [id number] Patient Account Number: [id number] Date of Birth/Sex: Treating RN: 08/30/1940 (80 y.o. [name] Primary Care Arnella Pralle: [name] Other Clinician: Referring Konica Stankowski: Treating Ileah Falkenstein/Extender: [name] in Treatment: 156 Vital Signs Time Taken: 12:55 Temperature  (F): 98.1 Height (in): 71 Pulse (bpm): 74 Weight (lbs): 220 Respiratory Rate (breaths/min): 20 Body Mass Index (BMI): 30.7 Blood Pressure (mmHg): 155/90 Reference Range: 80 - 120 mg / dl Electronic Signature(s) Signed: 09/26/2020 6:11:22 PM By: [name] RN, BSN Entered By: [name] on 09/26/2020 12:57:30

## 2020-09-27 DIAGNOSIS — Z86010 Personal history of colonic polyps: Secondary | ICD-10-CM | POA: Diagnosis not present

## 2020-09-27 DIAGNOSIS — R194 Change in bowel habit: Secondary | ICD-10-CM | POA: Diagnosis not present

## 2020-10-03 ENCOUNTER — Encounter: Payer: Self-pay | Admitting: Internal Medicine

## 2020-10-03 ENCOUNTER — Ambulatory Visit (INDEPENDENT_AMBULATORY_CARE_PROVIDER_SITE_OTHER): Payer: Medicare PPO | Admitting: Internal Medicine

## 2020-10-03 ENCOUNTER — Ambulatory Visit: Payer: Medicare PPO | Admitting: Internal Medicine

## 2020-10-03 DIAGNOSIS — Z0000 Encounter for general adult medical examination without abnormal findings: Secondary | ICD-10-CM

## 2020-10-03 NOTE — Patient Instructions (Signed)
Health Maintenance, Male Adopting a healthy lifestyle and getting preventive care are important in promoting health and wellness. Ask your health care provider about: The right schedule for you to have regular tests and exams. Things you can do on your own to prevent diseases and keep yourself healthy. What should I know about diet, weight, and exercise? Eat a healthy diet  Eat a diet that includes plenty of vegetables, fruits, low-fat dairy products, and lean protein. Do not eat a lot of foods that are high in solid fats, added sugars, or sodium. Maintain a healthy weight Body mass index (BMI) is a measurement that can be used to identify possible weight problems. It estimates body fat based on height and weight. Your health care provider can help determine your BMI and help you achieve or maintain a healthy weight. Get regular exercise Get regular exercise. This is one of the most important things you can do for your health. Most adults should: Exercise for at least 150 minutes each week. The exercise should increase your heart rate and make you sweat (moderate-intensity exercise). Do strengthening exercises at least twice a week. This is in addition to the moderate-intensity exercise. Spend less time sitting. Even light physical activity can be beneficial. Watch cholesterol and blood lipids Have your blood tested for lipids and cholesterol at 81 years of age, then have this test every 5 years. You may need to have your cholesterol levels checked more often if: Your lipid or cholesterol levels are high. You are older than 81 years of age. You are at high risk for heart disease. What should I know about cancer screening? Many types of cancers can be detected early and may often be prevented. Depending on your health history and family history, you may need to have cancer screening at various ages. This may include screening for: Colorectal cancer. Prostate cancer. Skin cancer. Lung  cancer. What should I know about heart disease, diabetes, and high blood pressure? Blood pressure and heart disease High blood pressure causes heart disease and increases the risk of stroke. This is more likely to develop in people who have high blood pressure readings, are of African descent, or are overweight. Talk with your health care provider about your target blood pressure readings. Have your blood pressure checked: Every 3-5 years if you are 18-39 years of age. Every year if you are 40 years old or older. If you are between the ages of 65 and 75 and are a current or former smoker, ask your health care provider if you should have a one-time screening for abdominal aortic aneurysm (AAA). Diabetes Have regular diabetes screenings. This checks your fasting blood sugar level. Have the screening done: Once every three years after age 45 if you are at a normal weight and have a low risk for diabetes. More often and at a younger age if you are overweight or have a high risk for diabetes. What should I know about preventing infection? Hepatitis B If you have a higher risk for hepatitis B, you should be screened for this virus. Talk with your health care provider to find out if you are at risk for hepatitis B infection. Hepatitis C Blood testing is recommended for: Everyone born from 1945 through 1965. Anyone with known risk factors for hepatitis C. Sexually transmitted infections (STIs) You should be screened each year for STIs, including gonorrhea and chlamydia, if: You are sexually active and are younger than 81 years of age. You are older than 81 years   of age and your health care provider tells you that you are at risk for this type of infection. Your sexual activity has changed since you were last screened, and you are at increased risk for chlamydia or gonorrhea. Ask your health care provider if you are at risk. Ask your health care provider about whether you are at high risk for HIV.  Your health care provider may recommend a prescription medicine to help prevent HIV infection. If you choose to take medicine to prevent HIV, you should first get tested for HIV. You should then be tested every 3 months for as long as you are taking the medicine. Follow these instructions at home: Lifestyle Do not use any products that contain nicotine or tobacco, such as cigarettes, e-cigarettes, and chewing tobacco. If you need help quitting, ask your health care provider. Do not use street drugs. Do not share needles. Ask your health care provider for help if you need support or information about quitting drugs. Alcohol use Do not drink alcohol if your health care provider tells you not to drink. If you drink alcohol: Limit how much you have to 0-2 drinks a day. Be aware of how much alcohol is in your drink. In the [place] one drink equals one 12 oz bottle of beer (355 mL), one 5 oz glass of wine (148 mL), or one 1 oz glass of hard liquor (44 mL). General instructions Schedule regular health, dental, and eye exams. Stay current with your vaccines. Tell your health care provider if: You often feel depressed. You have ever been abused or do not feel safe at home. Summary Adopting a healthy lifestyle and getting preventive care are important in promoting health and wellness. Follow your health care provider's instructions about healthy diet, exercising, and getting tested or screened for diseases. Follow your health care provider's instructions on monitoring your cholesterol and blood pressure. This information is not intended to replace advice given to you by your health care provider. Make sure you discuss any questions you have with your health care provider. Document Revised: 03/09/2020 Document Reviewed: 12/23/2017 Elsevier Patient Education  2022 Elsevier Inc.  

## 2020-10-03 NOTE — Progress Notes (Signed)
Subjective:   Carlos White. is a 80 y.o. male who presents for Medicare Annual/Subsequent preventive examination. I connected with  Carlos White. on 10/03/20 by a audio enabled telemedicine application and verified that I am speaking with the correct person using two identifiers.   I discussed the limitations of evaluation and management by telemedicine. The patient expressed understanding and agreed to proceed.   Location of patient:Home  Location of Provider:Office  Persons participating in virtual visit: [name] (patient) and Valli Glance Review of Systems    Defer to PCP       Objective:    There were no vitals filed for this visit. There is no height or weight on file to calculate BMI.  Advanced Directives 10/03/2020 05/08/2020 05/08/2020 05/08/2020  Does Patient Have a Medical Advance Directive? Yes No No No  Type of Paramedic of [place];Living will - - -  Would patient like information on creating a medical advance directive? - No - Patient declined No - Patient declined No - Patient declined    Current Medications (verified) Outpatient Encounter Medications as of 10/03/2020  Medication Sig   bisacodyl (DULCOLAX) 5 MG EC tablet Take 10 mg by mouth daily as needed for moderate constipation.   cetirizine (ZYRTEC) 10 MG tablet Take 10 mg by mouth daily.   losartan (COZAAR) 100 MG tablet TAKE 1 TABLET BY MOUTH EVERY DAY   Propylene Glycol (SYSTANE BALANCE OP) Place 1 drop into both eyes daily as needed (dry eyes).   senna-docusate (SENOKOT-S) 8.6-50 MG tablet Take 1 tablet by mouth at bedtime.   tamsulosin (FLOMAX) 0.4 MG CAPS capsule TAKE 1 CAPSULE BY MOUTH EVERY DAY   traMADol (ULTRAM) 50 MG tablet TAKE 1 TABLET BY MOUTH EVERY 12 HOURS AS NEEDED.   acetaminophen (TYLENOL) 500 MG tablet Take 1,000 mg by mouth every 6 (six) hours as needed for moderate pain. (Patient not taking: Reported on 10/03/2020)   docusate sodium (COLACE) 100 MG  capsule Take 100 mg by mouth 2 (two) times daily. (Patient not taking: Reported on 10/03/2020)   Multiple Vitamin (MULTIVITAMIN WITH MINERALS) TABS tablet Take 1 tablet by mouth daily. (Patient not taking: Reported on 10/03/2020)   polyethylene glycol (MIRALAX / GLYCOLAX) 17 g packet Take 17 g by mouth daily. (Patient not taking: Reported on 10/03/2020)   No facility-administered encounter medications on file as of 10/03/2020.    Allergies (verified) Patient has no known allergies.   History: Past Medical History:  Diagnosis Date   Adenomatous polyp    Atypical chest pain    Broken hip ([place])    Epidermolysis bullosa    History of alcoholism ([place])    Hypertension    Splenomegaly 08/18/2013   Past Surgical History:  Procedure Laterality Date   left leg surgery     Family History  Problem Relation Age of Onset   Hypertension Mother    Diabetes Mother    Heart disease Father    Social History   Socioeconomic History   Marital status: Single    Spouse name: Not on file   Number of children: Not on file   Years of education: Not on file   Highest education level: Not on file  Occupational History   Not on file  Tobacco Use   Smoking status: Former    Packs/day: 2.00    Years: 20.00    Pack years: 40.00    Types: Cigarettes    Quit date: 01/14/1983  Years since quitting: 37.7   Smokeless tobacco: Never  Substance and Sexual Activity   Alcohol use: No   Drug use: No   Sexual activity: Not on file  Other Topics Concern   Not on file  Social History Narrative   Not on file   Social Determinants of Health   Financial Resource Strain: Low Risk    Difficulty of Paying Living Expenses: Not hard at all  Food Insecurity: No Food Insecurity   Worried About [place] in the Last Year: Never true   [place] in the Last Year: Never true  Transportation Needs: No Transportation Needs   Lack of Transportation (Medical): No   Lack of Transportation  (Non-Medical): No  Physical Activity: Inactive   Days of Exercise per Week: 0 days   Minutes of Exercise per Session: 0 min  Stress: No Stress Concern Present   Feeling of Stress : Not at all  Social Connections: Moderately Isolated   Frequency of Communication with Friends and Family: Once a week   Frequency of Social Gatherings with Friends and Family: Once a week   Attends Religious Services: Never   Marine scientist or Organizations: Yes   Attends Music therapist: 1 to 4 times per year   Marital Status: Married    Tobacco Counseling Counseling given: Not Answered   Clinical Intake:  Pre-visit preparation completed: Yes  Pain : No/denies pain     Nutritional Risks: None Diabetes: No  How often do you need to have someone help you when you read instructions, pamphlets, or other written materials from your doctor or pharmacy?: 2 - Rarely What is the last grade level you completed in school?: BS DEGREE  Diabetic?NO  Interpreter Needed?: No  Information entered by :: Betti Goodenow J,CMA   Activities of Daily Living In your present state of health, do you have any difficulty performing the following activities: 10/03/2020 05/08/2020  Hearing? Y Y  Comment - wears bilateral hearing aids  Vision? N Y  Difficulty concentrating or making decisions? Y N  Walking or climbing stairs? Y Y  Dressing or bathing? N N  Doing errands, shopping? N [place] and eating ? N -  Using the Toilet? N -  In the past six months, have you accidently leaked urine? N -  Do you have problems with loss of bowel control? N -  Managing your Medications? N -  Managing your Finances? N -  Housekeeping or managing your Housekeeping? N -  Some recent data might be hidden    Patient Care Team: Baxley, Cresenciano Lick, MD as PCP - General (Internal Medicine)  Indicate any recent Medical Services you may have received from other than [facility] providers in the past year  (date may be approximate).     Assessment:   This is a routine wellness examination for [place].  Hearing/Vision screen No results found.  Dietary issues and exercise activities discussed: Current Exercise Habits: The patient does not participate in regular exercise at present   Goals Addressed   None    Depression Screen PHQ 2/9 Scores 10/03/2020 05/10/2019 02/23/2018 01/22/2015 12/21/2011 08/15/2011 08/15/2011  PHQ - 2 Score 0 0 0 0 0 0 0    Fall Risk Fall Risk  10/03/2020 06/01/2020 05/10/2019 02/23/2018 01/22/2015  Falls in the past year? 1 1 0 0 No  Number falls in past yr: 0 1 0 - -  Injury with  Fall? 1 1 0 - -  Risk for fall due to : No Fall Risks Impaired vision No Fall Risks - -  Follow up Falls evaluation completed Falls evaluation completed Falls evaluation completed - -    FALL RISK PREVENTION PERTAINING TO THE HOME:  Any stairs in or around the home? Yes  If so, are there any without handrails? No  Home free of loose throw rugs in walkways, pet beds, electrical cords, etc? Yes  Adequate lighting in your home to reduce risk of falls? Yes   ASSISTIVE DEVICES UTILIZED TO PREVENT FALLS:  Life alert? No  Use of a cane, walker or w/c? Yes  Grab bars in the bathroom? Yes  Shower chair or bench in shower? Yes  Elevated toilet seat or a handicapped toilet? No   TIMED UP AND GO:  Was the test performed?  N/A .  Length of time to ambulate 10 feet: N/A sec.     Cognitive Function:     6CIT Screen 10/03/2020  What Year? 0 points  What month? 0 points  What time? 0 points  Count back from 20 0 points  Months in reverse 0 points  Repeat phrase 10 points  Total Score 10    Immunizations Immunization History  Administered Date(s) Administered   Fluad Quad(high Dose 65+) 09/21/2018   Influenza, High Dose Seasonal PF 09/11/2015, 10/07/2016, 09/25/2020   Influenza,inj,Quad PF,6+ Mos 12/02/2017   Influenza-Unspecified 11/13/2013, 09/14/2019   PFIZER(Purple Top)SARS-COV-2  Vaccination 03/07/2019, 03/28/2019   Pfizer Covid-19 Vaccine Bivalent Booster 78yrs & up 09/25/2020   Pneumococcal Conjugate-13 01/22/2015   Tdap 04/28/1994, 08/15/2011   Zoster Recombinat (Shingrix) 09/21/2018    TDAP status: Up to date  Flu Vaccine status: Up to date  Covid-19 vaccine status: Information provided on how to obtain vaccines.   Qualifies for Shingles Vaccine? Yes   Zostavax completed No   Shingrix Completed?: No.    Education has been provided regarding the importance of this vaccine. Patient has been advised to call insurance company to determine out of pocket expense if they have not yet received this vaccine. Advised may also receive vaccine at local pharmacy or Health Dept. Verbalized acceptance and understanding.  Screening Tests Health Maintenance  Topic Date Due   Zoster Vaccines- Shingrix (2 of 2) 11/16/2018   COVID-19 Vaccine (3 - Booster for Pfizer series) 08/28/2019   TETANUS/TDAP  08/14/2021   INFLUENZA VACCINE  Completed   HPV VACCINES  Aged Out    Health Maintenance  Health Maintenance Due  Topic Date Due   Zoster Vaccines- Shingrix (2 of 2) 11/16/2018   COVID-19 Vaccine (3 - Booster for Pfizer series) 08/28/2019    Colorectal cancer screening: No longer required. Patient stated he is scheduled for November 2022  Lung Cancer Screening: (Low Dose CT Chest recommended if Age 38-80 years, 30 pack-year currently smoking OR have quit w/in 15years.) does not qualify.   Lung Cancer Screening Referral: NO  Additional Screening:  Hepatitis C Screening: does qualify; Completed once in life time.  Vision Screening: Recommended annual ophthalmology exams for early detection of glaucoma and other disorders of the eye. Is the patient up to date with their annual eye exam?  Yes  Who is the provider or what is the name of the office in which the patient attends annual eye exams? Dr. Herbert Deaner If pt is not established with a provider, would they like to be  referred to a provider to establish care? No .   Dental  Screening: Recommended annual dental exams for proper oral hygiene  Community Resource Referral / Chronic Care Management: CRR required this visit?  No   CCM required this visit?  No      Plan:     I have personally reviewed and noted the following in the patient's chart:   Medical and social history Use of alcohol, tobacco or illicit drugs  Current medications and supplements including opioid prescriptions. Patient is currently taking opioid prescriptions. Information provided to patient regarding non-opioid alternatives. Patient advised to discuss non-opioid treatment plan with their provider. Functional ability and status Nutritional status Physical activity Advanced directives List of other physicians Hospitalizations, surgeries, and ER visits in previous 12 months Vitals Screenings to include cognitive, depression, and falls Referrals and appointments  In addition, I have reviewed and discussed with patient certain preventive protocols, quality metrics, and best practice recommendations. A written personalized care plan for preventive services as well as general preventive health recommendations were provided to patient.     Edgar Frisk, [facility]   10/03/2020   Nurse Notes: Non Face to Face 30 minute visit   Mr. Bigley , Thank you for taking time to come for your Medicare Wellness Visit. I appreciate your ongoing commitment to your health goals. Please review the following plan we discussed and let me know if I can assist you in the future.   These are the goals we discussed:  Goals   None     This is a list of the screening recommended for you and due dates:  Health Maintenance  Topic Date Due   Zoster (Shingles) Vaccine (2 of 2) 11/16/2018   COVID-19 Vaccine (3 - Booster for Pfizer series) 08/28/2019   Tetanus Vaccine  08/14/2021   Flu Shot  Completed   HPV Vaccine  Aged Out    Reviewed and agree. Uvaldo Bristle, MD

## 2020-10-04 DIAGNOSIS — I10 Essential (primary) hypertension: Secondary | ICD-10-CM | POA: Diagnosis not present

## 2020-10-04 DIAGNOSIS — H3530 Unspecified macular degeneration: Secondary | ICD-10-CM | POA: Diagnosis not present

## 2020-10-04 DIAGNOSIS — G8929 Other chronic pain: Secondary | ICD-10-CM | POA: Diagnosis not present

## 2020-10-04 DIAGNOSIS — L97809 Non-pressure chronic ulcer of other part of unspecified lower leg with unspecified severity: Secondary | ICD-10-CM | POA: Diagnosis not present

## 2020-10-04 DIAGNOSIS — N400 Enlarged prostate without lower urinary tract symptoms: Secondary | ICD-10-CM | POA: Diagnosis not present

## 2020-10-04 DIAGNOSIS — M1990 Unspecified osteoarthritis, unspecified site: Secondary | ICD-10-CM | POA: Diagnosis not present

## 2020-10-04 DIAGNOSIS — R2681 Unsteadiness on feet: Secondary | ICD-10-CM | POA: Diagnosis not present

## 2020-10-04 DIAGNOSIS — F1021 Alcohol dependence, in remission: Secondary | ICD-10-CM | POA: Diagnosis not present

## 2020-10-04 DIAGNOSIS — K5900 Constipation, unspecified: Secondary | ICD-10-CM | POA: Diagnosis not present

## 2020-10-23 ENCOUNTER — Other Ambulatory Visit: Payer: Self-pay | Admitting: Internal Medicine

## 2020-10-29 DIAGNOSIS — S32402D Unspecified fracture of left acetabulum, subsequent encounter for fracture with routine healing: Secondary | ICD-10-CM | POA: Diagnosis not present

## 2020-11-07 ENCOUNTER — Encounter (HOSPITAL_BASED_OUTPATIENT_CLINIC_OR_DEPARTMENT_OTHER): Payer: Medicare PPO | Admitting: Physician Assistant

## 2020-11-14 ENCOUNTER — Encounter (HOSPITAL_BASED_OUTPATIENT_CLINIC_OR_DEPARTMENT_OTHER): Payer: Medicare PPO | Attending: Physician Assistant | Admitting: Physician Assistant

## 2020-11-14 ENCOUNTER — Other Ambulatory Visit: Payer: Self-pay

## 2020-11-14 DIAGNOSIS — L97822 Non-pressure chronic ulcer of other part of left lower leg with fat layer exposed: Secondary | ICD-10-CM | POA: Insufficient documentation

## 2020-11-14 DIAGNOSIS — I872 Venous insufficiency (chronic) (peripheral): Secondary | ICD-10-CM | POA: Insufficient documentation

## 2020-11-14 DIAGNOSIS — L97812 Non-pressure chronic ulcer of other part of right lower leg with fat layer exposed: Secondary | ICD-10-CM | POA: Insufficient documentation

## 2020-11-14 DIAGNOSIS — L97322 Non-pressure chronic ulcer of left ankle with fat layer exposed: Secondary | ICD-10-CM | POA: Insufficient documentation

## 2020-11-14 NOTE — Progress Notes (Addendum)
BRICK, KETCHER ([id number]) Visit Report for 11/14/2020 Chief Complaint Document Details Patient Name: Date of Service: Carlos White 11/14/2020 12:30 PM Medical Record Number: [id number] Patient Account Number: [id number] Date of Birth/Sex: Treating RN: 10/03/1940 (80 y.o. [name] Primary Care Provider: [name] Other Clinician: Referring Provider: Treating Provider/Extender: [name] in Treatment: [place] from: Patient Chief Complaint Bilateral LE Ulcers Electronic Signature(s) Signed: 11/14/2020 1:03:36 PM By: Worthy Keeler PA-C Entered By: Worthy Keeler on 11/14/2020 13:03:36 -------------------------------------------------------------------------------- HPI Details Patient Name: Date of Service: Jeris White, GO RDO N H. 11/14/2020 12:30 PM Medical Record Number: [id number] Patient Account Number: [id number] Date of Birth/Sex: Treating RN: 1940/10/11 (80 y.o. [name] Primary Care Provider: [name] Other Clinician: Referring Provider: Treating Provider/Extender: [name] in Treatment: 163 History of Present Illness HPI Description: 09/30/17 on evaluation today patient presents for initial evaluation and our clinic concerning issues that he has been having with his bilateral lower extremities. He states this has been going on for quite some time at least six months. Currently his regiment has been mainly cleaning the area with peroxide, applying the is foreign ointment, and wrapping the area with ABD pads and then an ace wrap loosely. He has dealt with issues of this nature he tells me for quite some time. He does have a history of having had a compound fracture of the left lower extremity which he thinks also makes this a much more difficult area for him to heal. He's previously been told that he had poor vascular flow but this was years ago at [facility] we do not have any of those  records at this time. He has a history of Epidermolysis Bullosa which was diagnosed around age 14 and he has been cared for at [facility] since that time. Subsequently he states this is hereditary and two of his children one male and one male also have this as well is one of his grandchildren that he is aware of. He has no evidence of infection necessarily at this point although he does have some necrotic tissue noted on the surface of the wound as far as the largest, left lateral lower extremity ulcer, is concerned. Overall I feel like all things considered he's been taking care of this very well. Obviously he has some fairly significant issues going on at this point in this regard. He does have a history otherwise of hypertension though for the most part other than the compound fracture of the left leg he seems to have been fairly healthy in my pinion. 10/07/17 on evaluation today patient actually appears to be doing better in regard to his bilateral lower extremity ulcers. With that being said he does still have some evidence of slough noted on the surface of the wounds I think the Iodoflex has been beneficial for him. His arterial studies are scheduled for October 2. With that being said I do believe that he is continuing to show signs of good improvement which is at least good news. 10/14/17 on evaluation today patient appears to be doing very well in regard to his lower extremity ulcers. He's definitely made some progress as far as healing is concerned although there still are several open areas that are going to need to be addressed. He did have his arterial study today which fortunately shows good findings with a right ABI of 1.23 with a TBI of 0.86 in the left ABI  of 1.28 with a TBI of 0.81. This is good news and will allow [place] to perform debridement as well. 10/23/2017; patient with a large wound on the left lateral calf, sizable area on the left medial malleolus and an area on the right lateral  malleolus. He has a new blister consistent with his underlying blistering skin disease just above this area we have been using Iodoflex on the lateral left calf lateral right ankle and collagen on the medial left ankle. We have been using Kerlix Coban wraps 10/28/17 on evaluation today the patient continues to have signs of improvement in regard to the overall appearance of the original wound. Unfortunately he did have some blistering over the right lateral lower extremity which has appeared to rupture on evaluation today and likely some of the dead tissue on the surface needs to be cleaned away the good news is this does not appear to be to significantly deep at this time. 11/04/17 on evaluation today patient actually appears to be doing a little better in regard to his lower extremity ulcers. He has been tolerating the dressing changes without complication. With that being said he does still have a significant wound especially over the left lateral lower extremity unfortunately. All of the wounds pretty much are going to require sharp debridement today. 11/11/17 on evaluation today patient appears to be doing more poorly in regard to his left lower extremity in particular. There does not appear to be any evidence of systemic infection although the wound itself as far as the larger left lateral lower extremity ulcer actually appears to be infected in my pinion. There's an older and the surface of the wound is dramatically worsened compared to last week. No fevers, chills, nausea, or vomiting noted at this time. 11/18/17 upon evaluation today patient actually appears to be doing better. I did review his culture today which really did not show any specific organism is a positive reason for his wound decline. There are multiple organisms present not predominant. Nonetheless he seems to be tolerate the doxycycline well in his wounds in general do seem to be doing better. Fortunately there does not appear  to be any evidence of infection at this time which is good news. Overall I'm very pleased with how things appear. Nonetheless he still has a lot of healing to go. I do think he could benefit from a Juxta-Lite wrap. 11/25/17 on evaluation today patient actually appears to be doing fairly well in regard to his wounds. He is still taking the antibiotics he has a few days left. Fortunately this seems to have been excellent for him as far as getting the infection control and very happy in this regard. With that being said the patient likewise is also very pleased with how things appear at this time in comparison to where we were he's not having as much pain. 12/02/17 Seen today for follow p and management of LLE wounds. Wounds appear to show some improvement. He denies pain, fever, or chills. Completed a course of doxycycline earlier this month. Scheduled to received Juxta-Lite wrap this week. No s/s of infections. 12/09/17 on evaluation today patient actually appears to be doing a little bit better in regard to his wounds. This is obscene very slow process and unfortunately he has a couple of new areas and this is due to the Epidermolysis Bullosa. Nonetheless I am concerned about the fact that he seems to be getting more areas not less that is the reason we're gonna work on  getting schedule for the vascular referral to see the venous specialist. 12/23/17 upon evaluation today patient's wounds currently shows evidence of still not doing quite as well is what I would like to have seen. Subsequently the patient did have his venous study which showed evidence of venous stasis. Subsequently I do think that a vascular evaluation for consideration of venous intervention would be appropriate. I'm not necessarily suggesting that will be anything that can be done but I think it is at least a good idea. He is in agreement with this plan. 12/30/17 on evaluation today patient actually appears to be doing very well in  regard to his wounds when compared to previous evaluation. Subsequently we have been using the [employer] Dressing which actually appears to have done excellent on his left lateral lower extremity ulcer. The quality of the wound surface is dramatically improved. There is some slight debridement that is going to be required at a couple of locations but overall I'm extremely pleased with how things appear here. 01/07/2018; this is a patient with a primary skin disorder epidermolyis bullosa. Is a large wound on the left lateral calf and smaller wounds on the right however there is a new wound on the right mid tibia area that occurred within the compression wrap that he did not change. We have been using Hydrofera Blue. On the left he is using Hydrofera Blue and Santyl to the inferior part of the wound and changing the dressing himself. 01/15/2018; primary skin disorder epidermolysis bullosa. He has several difficult wounds including the left lateral calf, smaller wounds on the left medial calf and the right lateral calf. The major area on the left lateral calf has a smaller area inferiorly that has necrotic debris we have been using Santyl to this. The rest of the wounds we have been using Hydrofera Blue. The area on the left calf actually looks larger this week. Uncontrolled edema several small open areas above it that are superficial 01/20/18 on evaluation today patient appears to be doing better as compared to last week in regard to his wounds of the bilateral lower extremities. He tolerated the bilateral compression wrap without complication. Overall I'm very pleased with how things appear at this time. The patient likewise is very happy. 01/27/18 on evaluation today patient appears to be doing decently well in regard to his bilateral lower extremity ulcers. He's been tolerating the dressing changes without complication. One issue he had is that he did have more drainage to the left leg wrapped last  week. He states in fact he probably should come in and let us change it on Friday however he just left it in place and kept adding extra absorption with ABD pads to the external portion of the wrap. Unfortunately he does have some aspiration type breakdown nothing significant but I do believe that this was probably counterproductive in general. Nonetheless his wounds do not appear to be terrible overall. 02/03/18 on evaluation today patient appears to be doing rather well in regard to his lower extremity ulcers. He has been tolerating the dressing changes without complication. He does tell me that he had to change the wrap on the left one since we last saw him. Subsequently I do not see any evidence of infection I do feel like the food was much better controlled at this point. 02/10/18 on evaluation today patient appears to be doing rather well in regard to his ulcers. He still has significant alterations especially on the left lateral lower extremity.  Fortunately there's no signs of infection at this time. Overall I feel like he is making good progress are some areas that I'm gonna attempt some debridement today. 02/17/18 on evaluation today patient appears to be doing okay in regard to his lower Trinity ulcer. It does appear on both locations he has a little bit of drainage causing some breakdown in maceration around the wound bed's although it doesn't appear to be too bad the right is a little bit worse than left. Fortunately there's no signs of infection which is good news. No fevers, chills, nausea, or vomiting noted at this time. 03/10/18 on evaluation today patient actually appears to be doing rather poorly in regard to his bilateral lower extremity ulcers. The right in particular is draining profusely and the wound is actually enlarging which is not good. I'm concerned about both possibly infection and the fact that there's a lot of moisture which is causing breakdown as well. Unfortunately the  patient has been trying to change this at home I'm afraid he may need to change more frequently in order to see the improvement that were looking for. There's no signs of systemic infection. 03/17/18 patient actually appears to be doing significantly better at this point in regard to his bilateral lower extremity ulcers. Fortunately there's no signs of infection. That is worsening infection at least indefinitely nothing systemic. With that being said he is having a lot of drainage though not quite as much is there in his last evaluation. Overall feel like he's on the side of improvement. I think if his results back from his culture which showed that he had a positive group B strep along with abundant Pseudomonas noted on the culture. For that reason I am gonna have him continue with the linezolid as we previously have ordered for him and I did go ahead as well today and prescribe Levaquin as well in order to treat the Pseudomonas portion of the infection noted. 03/24/18 on evaluation today patient actually appears to be doing very well in regard to his lower Trinity ulcer. He's been tolerating the dressing changes without complication. Fortunately both legs show signs of less drainage in his edema is very well controlled at this point as well. Overall very pleased with how things seem to be progressing. 03/31/18 on evaluation today patient actually appears to be doing excellent in regard to his bilateral lower extremity ulcers. These are not draining nearly as significant as what they were in the past overall seem to be shown signs of excellent improvement which is great news. Fortunately there is no sign of active infection at this time I do believe that the Levaquin has done extremely well for him in this regard. The patient continues to change these at home typically every day. We may be able to slowly work towards every other day changes since the drainage seems to be slowing down quite  significantly. 04/14/18 on evaluation today patient appears to be doing well in regard to his bilateral lower extremities. Let me Hilda Blades Almost completely healed which is excellent news. Fortunately he's shown signs of improvement all other sites as well with new skin growth there's some slight hyper granular tissue but for the most part this seems to be well maintained with the [facility] Dressing. I'm very happy in this regard. 04/28/18 on evaluation today patient appears to be doing rather well in regard to his ulcers of the bilateral lower extremities all things considering. He continues to make some progress as far  as new skin growth. There still some hyper granulation noted at this point despite the use of the [facility] Dressing. This is not terrible but I think we may want to consider conclude cauterization today with silver nitrate to try to help knock some of his back as well as helping with any biofilm on the surface of the wound. 05/12/18 on evaluation today patient's wounds actually appear to be doing fairly well in regard to the bilateral lower extremities. He's been tolerating the dressing changes without complication. Fortunately there's no signs of active infection at this time which is good news. Overall very pleased with how things seem to be progressing. You select silver nitrate was beneficial for him. 05/26/18 on evaluation today patient appears to be doing better in regard to left lower extremity and a little bit worse in regard to the right lower extremity. He states that he was pulling off the [facility] Dressing peel back some of the skin making this area significantly larger than what it was previous. He's not had any issues other than this and states even that hasn't caused any pain he just seems to obviously have a much larger area on the right when compared to the previous time I saw him. No fevers, chills, nausea, or vomiting noted at this time. 06/16/18 on  evaluation today patient actually appears to be doing a little better in my pinion in regard to his lower summary ulcers. He has new skin islands that seem to be spreading which is good news. Fortunately there's no signs of active infection at this time. His biggest issue is he tells me that coming as often as he does is becoming very cost prohibitive. He wonders if we can potentially spread this out. 07/14/18 on evaluation today patient appears to be doing a little bit worse in regard to his lower from the ulcer. Unfortunately he still continues to have a significant amount of drainage I think we need to do something to try to help this more. He is still somewhat reluctant to go the route of the Wound VAC although that may be the most appropriate thing for him. No fevers, chills, nausea, or vomiting noted at this time. 08/18/2018 on evaluation today patient actually appears to be doing quite well with regard to his bilateral lower extremity ulcers. I do feel like that currently he is making great progress the care max does seem to be doing a great job at helping to control the moisture he has no maceration or skin breakdown. Again this seems to be an excellent way to go. 1 thing we may want to change is adding collagen to the base of the wound and then the care max over top he is not opposed to this. 09/15/2018 on evaluation today patient appears to be doing well with regard to his bilateral lower extremity ulcers. He is showing some signs of improvement not necessarily in size but definitely in appearance. In fact he has a lot of new skin growing throughout the wounds along the edges as well as in the central portion of the wounds on both lower extremities. Overall I am extremely pleased to see this. 10/20/2018 on evaluation today patient actually appears to be doing quite well with regard to his wounds. They are not measuring significantly smaller but he does have a lot of new epithelization noted as  compared to previous. Fortunately there is no signs of active infection at this time. No fevers, chills, nausea, vomiting, or diarrhea. 11/17/2018  on evaluation today patient presents for reevaluation concerning his bilateral lower extremity ulcers. Fortunately there is no signs of active infection at this time today. He has been tolerating the dressing changes without complication. No fevers, chills, nausea, vomiting, or diarrhea. Unfortunately in general the patient has not made as much improvement as I would like to have seen up to this point. He has been tolerating the dressing changes without complication and he does an excellent job taking care of his wounds at home in my opinion. The biggest issue I see is that he is just not making the progress that we need to be seeing currently. I think we may want to consider having him seen at a plastic surgery appointment and he has previously seen someone in years past at [facility] in [place]. That is definitely a possibility for [place] to look into at this point. 12/29/2018 on evaluation today patient appears to be doing better in regard to the overall visual appearance of his wounds which do not appear to be as macerated. He does have a much larger skin island in the middle of the left lower extremity ulcer which is doing much better. He tells me the pain is also significantly better. With that being said overall his improvement as far as the size of the wounds is not better but again these are very irregular in change shape quite often. Fortunately there is no evidence of active infection at this time which is great news. He never heard anything from [facility] regarding the plastic surgery referral that we made to them. 01/26/2019 upon evaluation today patient appears to be doing a little better in regard to his wounds today. He has been tolerating the dressing changes again he performs these for the most part on his own. He does a great  job wrapping his legs in my opinion. Unfortunately he has not been able to get down to [facility] to see if there is anything from a plastic surgery standpoint that could be done to help with his legs simply due to the fact that his wife unfortunately sustained a compression fracture in her spine she is seeing Dr. Saintclair Halsted and subsequently is going to be having what sounds to be a kyphoplasty type procedure. With that being said that has not been scheduled yet there is still waiting on an MRI the patient is very busy in fact overly busy trying to help take care of his wife at this point. I completely understand this is more of a strain on him at this time 02/23/2019 upon evaluation today patient actually appears to be making some progress. I am actually very pleased with the overall appearance of his wounds even compared to last evaluation. He seems to be doing quite well. He is taking care of his wife unfortunately she did have a compression fracture she has had the procedure for this but still she has a slow road to recovery. For that reason he still not gone to [facility] for a second opinion in this regard. Obviously the goal there was if there was anything that can be done from a skin graft standpoint or otherwise. 03/23/2019 upon evaluation today patient continues to have issues with lower extremity ulcers. Since the beginning he has made progress but at the same time the wounds unfortunately just will not close. We have been trying to get the patient to [facility] to see a specialist there but unfortunately with the everything going on with his  wife he has not been able to make that appointment time yet he states he may be able to in the next 1-2 months but is not really sure. 04/27/2019 on evaluation today patient appears to be doing a little bit more poorly. His last evaluation. He appears to have some erythema around the edges of the wound at this point. Fortunately there is no  signs of active infection at this time which is good news. No fevers, chills, nausea, vomiting, or diarrhea. 06/08/2019 on evaluation today patient appears to be doing well with regard to his wounds. Overall they are actually measuring smaller compared to the last visit last month. We did treat him for Pseudomonas as well as methicillin-resistant Staph aureus. He was only on the treatment for MRSA however for 7 days as the Cipro was resistant and subsequently we had to place him on doxycycline. Nonetheless I am thinking that we may want to add the doxycycline and just do a month-long treatment considering the longstanding nature of his wounds and see if we get this under better control. 07/13/2019 upon evaluation today patient appears to be doing fairly well in regard to his bilateral lower extremities. There does not appear to be any signs of active infection which is good news. No fevers, chills, nausea, vomiting, or diarrhea. 08/10/2019 upon evaluation today patient appears to be doing about the same with regard to his wounds in general. Unfortunately he is not significantly better although is also not significantly worse which is great news there is no evidence of active infection at this time which is good news. He still dealing with a lot going on with his wife and therefore is not really able to go see anyone at the specialty clinic at [facility] that we have previously set up still. 09/07/2019 on evaluation today patient appears to be doing well with regard to his wounds. In fact this is probably the best that have seen so far in quite a few months. Overall I am very pleased with where things stand at this time. No fevers, chills, nausea, vomiting, or diarrhea. 10/05/2019 upon evaluation today patient appears to be doing more poorly in regard to his legs at this point. He actually is showing some signs of infection. This has been something that we seem to be back-and-forth with as far as trying to keep  these wounds from becoming infected. He takes care of them very well in my opinion but nonetheless I am concerned in this regard. He tells me that his wife is still doing really about the same she is slowly getting better. Nonetheless he still spends the majority of his time helping to take care of her. 11/02/2019 upon evaluation today patient actually appears to be doing somewhat better in regard to his legs. I do believe that the compounded antibiotic treatment from [facility] has been beneficial for him. Overall I am extremely pleased with where things stand today. There is no signs of active infection at this time. The patient states he has much less drainage than he has in the past. 12/07/2019 upon evaluation today patient appears to be doing well at this time in regard to the overall appearance of his wound bed. Currently there is no signs of active infection at this time. 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 Especially when  he stressed. Fortunately there is no signs of active infection locally or systemically at this point. 01/18/2020 on evaluation today patient appears to be doing well with regard to his leg ulcers. He has been tolerating the dressing changes without complication. Fortunately there is no signs of infection and overall I feel like his legs are doing about the best they have done in quite some time. There does not appear to be any evidence of active infection which is great news and overall very pleased. 02/29/2020 upon evaluation today patient's wounds actually appear to be doing quite well currently. There is no sign of active infection at this time. No fevers, chills, nausea, vomiting, or diarrhea. 04/11/2025 on evaluation today patient appears to be doing excellent in regard to his wounds on the legs to be  honest. He has made a lot of progress there does not appear to be any signs of active infection and overall I think that he is better than previous although still the wounds are quite significant obviously. In general I think that he is pleased with where things stand at this point. Still there is quite a bit of work to do here. 07/13/2020 upon evaluation today patient appears to be doing well with regard to his wound. Is been tolerating the dressing changes without complication on both legs. He just has the 2 main wounds on each leg at the ankle on the medial region of the right leg is completely healed. His wounds do seem to have gotten better during the time that he has been on bedrest as result of the pelvis fracture. Obviously does not the way that we wanted to see things improved but nonetheless he tells me that it is what it is. Fortunately he does seem to be doing better he tells me that her daughter from the [place] is actually down helping to take care of them out as he and his wife while he is recovering. 08/08/2020 upon evaluation today patient appears to be doing well currently in regard to his leg ulcers. He has been tolerating the dressing changes without complication. Fortunately there is no signs of active infection at this time. No fevers, chills, nausea, vomiting, or diarrhea. 09/26/2020 upon evaluation today patient appears to be doing well with regard to his wound. He has been showing signs of good improvement which is great news and overall I am extremely pleased with where things stand today. I do think that he does well with his dressing changes and overall has really improved significantly here. segment how that how she doing 11/14/2020 upon evaluation today patient appears to be doing well with regard to his leg ulcers. He has been tolerating the dressing changes without complication. Fortunately there does not appear to be any signs of active infection at this time. No fevers,  chills, nausea, vomiting, or diarrhea. Electronic Signature(s) Signed: 11/14/2020 1:16:42 PM By: Worthy Keeler PA-C Entered By: Worthy Keeler on 11/14/2020 13:16:41 -------------------------------------------------------------------------------- Physical Exam Details Patient Name: Date of Service: Carlos White 11/14/2020 12:30 PM Medical Record Number: [id number] Patient Account Number: [id number] Date of Birth/Sex: Treating RN: Oct 10, 1940 (80 y.o. [name] Primary Care Provider: [name] Other Clinician: Referring Provider: Treating Provider/Extender: [name] in Treatment: 63 Constitutional Well-nourished and well-hydrated in no acute distress. Respiratory normal breathing without difficulty. Psychiatric this patient is able to make decisions and demonstrates good insight into disease process. Alert and Oriented x 3. pleasant and cooperative. Notes  Upon inspection patient's wound bed showed signs of good granulation epithelization at this point. Fortunately there does not appear to be any signs of active infection systemically which is great news and overall I am extremely pleased with where things stand. No fevers, chills, nausea, vomiting, or diarrhea. Electronic Signature(s) Signed: 11/14/2020 1:17:00 PM By: Worthy Keeler PA-C Entered By: Worthy Keeler on 11/14/2020 13:17:00 -------------------------------------------------------------------------------- Physician Orders Details Patient Name: Date of Service: [name] RDO N [facility]. 11/14/2020 12:30 PM Medical Record Number: [id number] Patient Account Number: [id number] Date of Birth/Sex: Treating RN: 1940/02/18 (80 y.o. 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 - on days that dressing is changed Edema Control - Lymphedema / SCD / Other Bilateral Lower Extremities Elevate legs to the level of the heart or above for 30 minutes daily and/or when sitting, a frequency of: - throughout the day Avoid standing for long periods of time. Exercise regularly Moisturize legs daily. Compression stocking or Garment 20-30 mm/Hg pressure to: - Juxtalite to both legs daily Wound Treatment Wound #2 - Lower Leg Wound Laterality: Left, Lateral, Distal Cleanser: Soap and Water 2 x Per Week/30 Days Discharge Instructions: May shower and wash wound with dial antibacterial soap and water prior to dressing change. Peri-Wound Care: Zinc Oxide Ointment 30g tube 2 x Per Week/30 Days Discharge Instructions: Apply Zinc Oxide to periwound with each dressing change as needed for maceration Peri-Wound Care: Sween Lotion (Moisturizing lotion) 2 x Per Week/30 Days Discharge Instructions: Apply moisturizing lotion as directed Prim Dressing: Triple antibiotic ointment 2 x Per Week/30 Days ary Discharge Instructions: generic to neosporin Secondary Dressing: ABD Pad, 5x9 (Generic) 2 x Per Week/30 Days Discharge Instructions: Apply over primary dressing as directed. Secondary Dressing: Zetuvit Plus 4x8 in 2 x Per Week/30 Days Discharge Instructions: Apply over primary dressing as directed. Secondary Dressing: Xtrasorb Classic Super Absorbent Dressing, 6x9 (in/in) (Dispense As Written) 2 x  Per Week/30 Days Discharge Instructions: Apply over primary dressing as directed. Secured With: Elastic Bandage 4 inch (ACE bandage) 2 x Per Week/30 Days Discharge Instructions: Secure with ACE bandage as directed. Secured With: [employer], 4.5x3.1 (in/yd) (Generic) 2 x Per Week/30 Days Discharge Instructions: Secure with Kerlix as directed. Wound #5 - Lower Leg Wound Laterality: Right, Lateral Cleanser: Soap and Water 2 x Per Week/30 Days Discharge Instructions: May shower and wash wound with dial antibacterial soap and water prior to dressing change. Peri-Wound Care: Zinc Oxide Ointment 30g tube 2 x Per Week/30 Days Discharge Instructions: Apply Zinc Oxide to periwound with each dressing change as needed for maceration Peri-Wound Care: Sween Lotion (Moisturizing lotion) 2 x Per Week/30 Days Discharge Instructions: Apply moisturizing lotion as directed Prim Dressing: Triple antibiotic ointment 2 x Per Week/30 Days ary Discharge Instructions: generic to neosporin Secondary Dressing: ABD Pad, 5x9 (Generic) 2 x Per Week/30 Days Discharge Instructions: Apply over primary dressing as directed.  Secondary Dressing: Zetuvit Plus 4x8 in 2 x Per Week/30 Days Discharge Instructions: Apply over primary dressing as directed. Secondary Dressing: Xtrasorb Classic Super Absorbent Dressing, 6x9 (in/in) (Dispense As Written) 2 x Per Week/30 Days Discharge Instructions: Apply over primary dressing as directed. Secured With: Elastic Bandage 4 inch (ACE bandage) 2 x Per Week/30 Days Discharge Instructions: Secure with ACE bandage as directed. Secured With: [employer], 4.5x3.1 (in/yd) (Generic) 2 x Per Week/30 Days Discharge Instructions: Secure with Kerlix as directed. Electronic Signature(s) Signed: 11/14/2020 6:26:15 PM By: Worthy Keeler PA-C Signed: 11/15/2020 6:11:26 PM By: [name] RN, BSN Entered By: [name] on 11/14/2020  13:10:08 -------------------------------------------------------------------------------- Problem List Details Patient Name: Date of Service: Jeris White, [place]. 11/14/2020 12:30 PM Medical Record Number: [id number] Patient Account Number: [id number] Date of Birth/Sex: Treating RN: 10-Jan-1941 (80 y.o. 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 09/30/2017 No Yes L97.322 Non-pressure chronic ulcer of left ankle with fat layer exposed 09/30/2017 No Yes L97.822 Non-pressure chronic ulcer of other part of left lower leg with fat layer exposed9/18/2019 No Yes L97.812 Non-pressure chronic ulcer of other part of right lower leg with fat layer 09/30/2017 No Yes exposed I10 Essential (primary) hypertension 09/30/2017 No Yes Inactive Problems Resolved Problems Electronic Signature(s) Signed: 11/14/2020 1:03:24 PM By: Worthy Keeler PA-C Entered By: Worthy Keeler on 11/14/2020 13:03:24 -------------------------------------------------------------------------------- Progress Note Details Patient Name: Date of Service: Jeris White, [place]. 11/14/2020 12:30 PM Medical Record Number: [id number] Patient Account Number: [id number] Date of Birth/Sex: Treating RN: Feb 17, 1940 (80 y.o. [name] Primary Care Provider: [name] Other Clinician: Referring Provider: Treating Provider/Extender: [name] in Treatment: [phone] Subjective Chief Complaint Information obtained from Patient Bilateral LE Ulcers History of Present Illness (HPI) 09/30/17 on evaluation today patient presents for initial evaluation and our clinic concerning issues that he has been having with his  bilateral lower extremities. He states this has been going on for quite some time at least six months. Currently his regiment has been mainly cleaning the area with peroxide, applying the is foreign ointment, and wrapping the area with ABD pads and then an ace wrap loosely. He has dealt with issues of this nature he tells me for quite some time. He does have a history of having had a compound fracture of the left lower extremity which he thinks also makes this a much more difficult area for him to heal. He's previously been told that he had poor vascular flow but this was years ago at [facility] we do not have any of those records at this time. He has a history of Epidermolysis Bullosa which was diagnosed around age 18 and he has been cared for at [facility] since that time. Subsequently he states this is hereditary and two of his children one male and one male also have this as well is one of his grandchildren that he is aware of. He has no evidence of infection necessarily at this point although he does have some necrotic tissue noted on the surface of the wound as far as the largest, left lateral lower extremity ulcer, is concerned. Overall I feel like all things considered he's been taking care of this very well. Obviously he has some fairly  significant issues going on at this point in this regard. He does have a history otherwise of hypertension though for the most part other than the compound fracture of the left leg he seems to have been fairly healthy in my pinion. 10/07/17 on evaluation today patient actually appears to be doing better in regard to his bilateral lower extremity ulcers. With that being said he does still have some evidence of slough noted on the surface of the wounds I think the Iodoflex has been beneficial for him. His arterial studies are scheduled for October 2. With that being said I do believe that he is continuing to show signs of good improvement which is at least good  news. 10/14/17 on evaluation today patient appears to be doing very well in regard to his lower extremity ulcers. He's definitely made some progress as far as healing is concerned although there still are several open areas that are going to need to be addressed. He did have his arterial study today which fortunately shows good findings with a right ABI of 1.23 with a TBI of 0.86 in the left ABI of 1.28 with a TBI of 0.81. This is good news and will allow [place] to perform debridement as well. 10/23/2017; patient with a large wound on the left lateral calf, sizable area on the left medial malleolus and an area on the right lateral malleolus. He has a new blister consistent with his underlying blistering skin disease just above this area we have been using Iodoflex on the lateral left calf lateral right ankle and collagen on the medial left ankle. We have been using Kerlix Coban wraps 10/28/17 on evaluation today the patient continues to have signs of improvement in regard to the overall appearance of the original wound. Unfortunately he did have some blistering over the right lateral lower extremity which has appeared to rupture on evaluation today and likely some of the dead tissue on the surface needs to be cleaned away the good news is this does not appear to be to significantly deep at this time. 11/04/17 on evaluation today patient actually appears to be doing a little better in regard to his lower extremity ulcers. He has been tolerating the dressing changes without complication. With that being said he does still have a significant wound especially over the left lateral lower extremity unfortunately. All of the wounds pretty much are going to require sharp debridement today. 11/11/17 on evaluation today patient appears to be doing more poorly in regard to his left lower extremity in particular. There does not appear to be any evidence of systemic infection although the wound itself as far as the  larger left lateral lower extremity ulcer actually appears to be infected in my pinion. There's an older and the surface of the wound is dramatically worsened compared to last week. No fevers, chills, nausea, or vomiting noted at this time. 11/18/17 upon evaluation today patient actually appears to be doing better. I did review his culture today which really did not show any specific organism is a positive reason for his wound decline. There are multiple organisms present not predominant. Nonetheless he seems to be tolerate the doxycycline well in his wounds in general do seem to be doing better. Fortunately there does not appear to be any evidence of infection at this time which is good news. Overall I'm very pleased with how things appear. Nonetheless he still has a lot of healing to go. I do think he could benefit from a Juxta-Lite  wrap. 11/25/17 on evaluation today patient actually appears to be doing fairly well in regard to his wounds. He is still taking the antibiotics he has a few days left. Fortunately this seems to have been excellent for him as far as getting the infection control and very happy in this regard. With that being said the patient likewise is also very pleased with how things appear at this time in comparison to where we were he's not having as much pain. 12/02/17 Seen today for follow p and management of LLE wounds. Wounds appear to show some improvement. He denies pain, fever, or chills. Completed a course of doxycycline earlier this month. Scheduled to received Juxta-Lite wrap this week. No s/s of infections. 12/09/17 on evaluation today patient actually appears to be doing a little bit better in regard to his wounds. This is obscene very slow process and unfortunately he has a couple of new areas and this is due to the Epidermolysis Bullosa. Nonetheless I am concerned about the fact that he seems to be getting more areas not less that is the reason we're gonna work on getting  schedule for the vascular referral to see the venous specialist. 12/23/17 upon evaluation today patient's wounds currently shows evidence of still not doing quite as well is what I would like to have seen. Subsequently the patient did have his venous study which showed evidence of venous stasis. Subsequently I do think that a vascular evaluation for consideration of venous intervention would be appropriate. I'm not necessarily suggesting that will be anything that can be done but I think it is at least a good idea. He is in agreement with this plan. 12/30/17 on evaluation today patient actually appears to be doing very well in regard to his wounds when compared to previous evaluation. Subsequently we have been using the [employer] Dressing which actually appears to have done excellent on his left lateral lower extremity ulcer. The quality of the wound surface is dramatically improved. There is some slight debridement that is going to be required at a couple of locations but overall I'm extremely pleased with how things appear here. 01/07/2018; this is a patient with a primary skin disorder epidermolyis bullosa. Is a large wound on the left lateral calf and smaller wounds on the right however there is a new wound on the right mid tibia area that occurred within the compression wrap that he did not change. We have been using Hydrofera Blue. On the left he is using Hydrofera Blue and Santyl to the inferior part of the wound and changing the dressing himself. 01/15/2018; primary skin disorder epidermolysis bullosa. He has several difficult wounds including the left lateral calf, smaller wounds on the left medial calf and the right lateral calf. The major area on the left lateral calf has a smaller area inferiorly that has necrotic debris we have been using Santyl to this. The rest of the wounds we have been using Hydrofera Blue. The area on the left calf actually looks larger this week. Uncontrolled  edema several small open areas above it that are superficial 01/20/18 on evaluation today patient appears to be doing better as compared to last week in regard to his wounds of the bilateral lower extremities. He tolerated the bilateral compression wrap without complication. Overall I'm very pleased with how things appear at this time. The patient likewise is very happy. 01/27/18 on evaluation today patient appears to be doing decently well in regard to his bilateral lower extremity ulcers. He's  been tolerating the dressing changes without complication. One issue he had is that he did have more drainage to the left leg wrapped last week. He states in fact he probably should come in and let us change it on Friday however he just left it in place and kept adding extra absorption with ABD pads to the external portion of the wrap. Unfortunately he does have some aspiration type breakdown nothing significant but I do believe that this was probably counterproductive in general. Nonetheless his wounds do not appear to be terrible overall. 02/03/18 on evaluation today patient appears to be doing rather well in regard to his lower extremity ulcers. He has been tolerating the dressing changes without complication. He does tell me that he had to change the wrap on the left one since we last saw him. Subsequently I do not see any evidence of infection I do feel like the food was much better controlled at this point. 02/10/18 on evaluation today patient appears to be doing rather well in regard to his ulcers. He still has significant alterations especially on the left lateral lower extremity. Fortunately there's no signs of infection at this time. Overall I feel like he is making good progress are some areas that I'm gonna attempt some debridement today. 02/17/18 on evaluation today patient appears to be doing okay in regard to his lower Trinity ulcer. It does appear on both locations he has a little bit of  drainage causing some breakdown in maceration around the wound bed's although it doesn't appear to be too bad the right is a little bit worse than left. Fortunately there's no signs of infection which is good news. No fevers, chills, nausea, or vomiting noted at this time. 03/10/18 on evaluation today patient actually appears to be doing rather poorly in regard to his bilateral lower extremity ulcers. The right in particular is draining profusely and the wound is actually enlarging which is not good. I'm concerned about both possibly infection and the fact that there's a lot of moisture which is causing breakdown as well. Unfortunately the patient has been trying to change this at home I'm afraid he may need to change more frequently in order to see the improvement that were looking for. There's no signs of systemic infection. 03/17/18 patient actually appears to be doing significantly better at this point in regard to his bilateral lower extremity ulcers. Fortunately there's no signs of infection. That is worsening infection at least indefinitely nothing systemic. With that being said he is having a lot of drainage though not quite as much is there in his last evaluation. Overall feel like he's on the side of improvement. I think if his results back from his culture which showed that he had a positive group B strep along with abundant Pseudomonas noted on the culture. For that reason I am gonna have him continue with the linezolid as we previously have ordered for him and I did go ahead as well today and prescribe Levaquin as well in order to treat the Pseudomonas portion of the infection noted. 03/24/18 on evaluation today patient actually appears to be doing very well in regard to his lower Trinity ulcer. He's been tolerating the dressing changes without complication. Fortunately both legs show signs of less drainage in his edema is very well controlled at this point as well. Overall very pleased with  how things seem to be progressing. 03/31/18 on evaluation today patient actually appears to be doing excellent in regard to his bilateral  lower extremity ulcers. These are not draining nearly as significant as what they were in the past overall seem to be shown signs of excellent improvement which is great news. Fortunately there is no sign of active infection at this time I do believe that the Levaquin has done extremely well for him in this regard. The patient continues to change these at home typically every day. We may be able to slowly work towards every other day changes since the drainage seems to be slowing down quite significantly. 04/14/18 on evaluation today patient appears to be doing well in regard to his bilateral lower extremities. Let me [facility] Almost completely healed which is excellent news. Fortunately he's shown signs of improvement all other sites as well with new skin growth there's some slight hyper granular tissue but for the most part this seems to be well maintained with the [facility] Dressing. I'm very happy in this regard. 04/28/18 on evaluation today patient appears to be doing rather well in regard to his ulcers of the bilateral lower extremities all things considering. He continues to make some progress as far as new skin growth. There still some hyper granulation noted at this point despite the use of the [facility] Dressing. This is not terrible but I think we may want to consider conclude cauterization today with silver nitrate to try to help knock some of his back as well as helping with any biofilm on the surface of the wound. 05/12/18 on evaluation today patient's wounds actually appear to be doing fairly well in regard to the bilateral lower extremities. He's been tolerating the dressing changes without complication. Fortunately there's no signs of active infection at this time which is good news. Overall very pleased with how things seem to  be progressing. You select silver nitrate was beneficial for him. 05/26/18 on evaluation today patient appears to be doing better in regard to left lower extremity and a little bit worse in regard to the right lower extremity. He states that he was pulling off the [facility] Dressing peel back some of the skin making this area significantly larger than what it was previous. He's not had any issues other than this and states even that hasn't caused any pain he just seems to obviously have a much larger area on the right when compared to the previous time I saw him. No fevers, chills, nausea, or vomiting noted at this time. 06/16/18 on evaluation today patient actually appears to be doing a little better in my pinion in regard to his lower summary ulcers. He has new skin islands that seem to be spreading which is good news. Fortunately there's no signs of active infection at this time. His biggest issue is he tells me that coming as often as he does is becoming very cost prohibitive. He wonders if we can potentially spread this out. 07/14/18 on evaluation today patient appears to be doing a little bit worse in regard to his lower from the ulcer. Unfortunately he still continues to have a significant amount of drainage I think we need to do something to try to help this more. He is still somewhat reluctant to go the route of the Wound VAC although that may be the most appropriate thing for him. No fevers, chills, nausea, or vomiting noted at this time. 08/18/2018 on evaluation today patient actually appears to be doing quite well with regard to his bilateral lower extremity ulcers. I do feel like that currently he is making great progress  the care max does seem to be doing a great job at helping to control the moisture he has no maceration or skin breakdown. Again this seems to be an excellent way to go. 1 thing we may want to change is adding collagen to the base of the wound and then the care max over top  he is not opposed to this. 09/15/2018 on evaluation today patient appears to be doing well with regard to his bilateral lower extremity ulcers. He is showing some signs of improvement not necessarily in size but definitely in appearance. In fact he has a lot of new skin growing throughout the wounds along the edges as well as in the central portion of the wounds on both lower extremities. Overall I am extremely pleased to see this. 10/20/2018 on evaluation today patient actually appears to be doing quite well with regard to his wounds. They are not measuring significantly smaller but he does have a lot of new epithelization noted as compared to previous. Fortunately there is no signs of active infection at this time. No fevers, chills, nausea, vomiting, or diarrhea. 11/17/2018 on evaluation today patient presents for reevaluation concerning his bilateral lower extremity ulcers. Fortunately there is no signs of active infection at this time today. He has been tolerating the dressing changes without complication. No fevers, chills, nausea, vomiting, or diarrhea. Unfortunately in general the patient has not made as much improvement as I would like to have seen up to this point. He has been tolerating the dressing changes without complication and he does an excellent job taking care of his wounds at home in my opinion. The biggest issue I see is that he is just not making the progress that we need to be seeing currently. I think we may want to consider having him seen at a plastic surgery appointment and he has previously seen someone in years past at [facility] in [place]. That is definitely a possibility for [place] to look into at this point. 12/29/2018 on evaluation today patient appears to be doing better in regard to the overall visual appearance of his wounds which do not appear to be as macerated. He does have a much larger skin island in the middle of the left lower extremity ulcer  which is doing much better. He tells me the pain is also significantly better. With that being said overall his improvement as far as the size of the wounds is not better but again these are very irregular in change shape quite often. Fortunately there is no evidence of active infection at this time which is great news. He never heard anything from [facility] regarding the plastic surgery referral that we made to them. 01/26/2019 upon evaluation today patient appears to be doing a little better in regard to his wounds today. He has been tolerating the dressing changes again he performs these for the most part on his own. He does a great job wrapping his legs in my opinion. Unfortunately he has not been able to get down to [facility] to see if there is anything from a plastic surgery standpoint that could be done to help with his legs simply due to the fact that his wife unfortunately sustained a compression fracture in her spine she is seeing Dr. Saintclair Halsted and subsequently is going to be having what sounds to be a kyphoplasty type procedure. With that being said that has not been scheduled yet there is still waiting on an MRI the patient is very busy  in fact overly busy trying to help take care of his wife at this point. I completely understand this is more of a strain on him at this time 02/23/2019 upon evaluation today patient actually appears to be making some progress. I am actually very pleased with the overall appearance of his wounds even compared to last evaluation. He seems to be doing quite well. He is taking care of his wife unfortunately she did have a compression fracture she has had the procedure for this but still she has a slow road to recovery. For that reason he still not gone to [facility] for a second opinion in this regard. Obviously the goal there was if there was anything that can be done from a skin graft standpoint or otherwise. 03/23/2019 upon evaluation today patient continues to have issues  with lower extremity ulcers. Since the beginning he has made progress but at the same time the wounds unfortunately just will not close. We have been trying to get the patient to [facility] to see a specialist there but unfortunately with the everything going on with his wife he has not been able to make that appointment time yet he states he may be able to in the next 1-2 months but is not really sure. 04/27/2019 on evaluation today patient appears to be doing a little bit more poorly. His last evaluation. He appears to have some erythema around the edges of the wound at this point. Fortunately there is no signs of active infection at this time which is good news. No fevers, chills, nausea, vomiting, or diarrhea. 06/08/2019 on evaluation today patient appears to be doing well with regard to his wounds. Overall they are actually measuring smaller compared to the last visit last month. We did treat him for Pseudomonas as well as methicillin-resistant Staph aureus. He was only on the treatment for MRSA however for 7 days as the Cipro was resistant and subsequently we had to place him on doxycycline. Nonetheless I am thinking that we may want to add the doxycycline and just do a month-long treatment considering the longstanding nature of his wounds and see if we get this under better control. 07/13/2019 upon evaluation today patient appears to be doing fairly well in regard to his bilateral lower extremities. There does not appear to be any signs of active infection which is good news. No fevers, chills, nausea, vomiting, or diarrhea. 08/10/2019 upon evaluation today patient appears to be doing about the same with regard to his wounds in general. Unfortunately he is not significantly better although is also not significantly worse which is great news there is no evidence of active infection at this time which is good news. He still dealing with a lot going on with his wife and  therefore is not really able to go see anyone at the specialty clinic at [facility] that we have previously set up still. 09/07/2019 on evaluation today patient appears to be doing well with regard to his wounds. In fact this is probably the best that have seen so far in quite a few months. Overall I am very pleased with where things stand at this time. No fevers, chills, nausea, vomiting, or diarrhea. 10/05/2019 upon evaluation today patient appears to be doing more poorly in regard to his legs at this point. He actually is showing some signs of infection. This has been something that we seem to be back-and-forth with as far as trying to keep these wounds from becoming infected. He  takes care of them very well in my opinion but nonetheless I am concerned in this regard. He tells me that his wife is still doing really about the same she is slowly getting better. Nonetheless he still spends the majority of his time helping to take care of her. 11/02/2019 upon evaluation today patient actually appears to be doing somewhat better in regard to his legs. I do believe that the compounded antibiotic treatment from [facility] has been beneficial for him. Overall I am extremely pleased with where things stand today. There is no signs of active infection at this time. The patient states he has much less drainage than he has in the past. 12/07/2019 upon evaluation today patient appears to be doing well at this time in regard to the overall appearance of his wound bed. Currently there is no signs of active infection at this time. 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 Especially when he stressed. Fortunately there is no signs of active infection locally or systemically at this point. 01/18/2020 on evaluation today patient appears to be doing well with  regard to his leg ulcers. He has been tolerating the dressing changes without complication. Fortunately there is no signs of infection and overall I feel like his legs are doing about the best they have done in quite some time. There does not appear to be any evidence of active infection which is great news and overall very pleased. 02/29/2020 upon evaluation today patient's wounds actually appear to be doing quite well currently. There is no sign of active infection at this time. No fevers, chills, nausea, vomiting, or diarrhea. 04/11/2025 on evaluation today patient appears to be doing excellent in regard to his wounds on the legs to be honest. He has made a lot of progress there does not appear to be any signs of active infection and overall I think that he is better than previous although still the wounds are quite significant obviously. In general I think that he is pleased with where things stand at this point. Still there is quite a bit of work to do here. 07/13/2020 upon evaluation today patient appears to be doing well with regard to his wound. Is been tolerating the dressing changes without complication on both legs. He just has the 2 main wounds on each leg at the ankle on the medial region of the right leg is completely healed. His wounds do seem to have gotten better during the time that he has been on bedrest as result of the pelvis fracture. Obviously does not the way that we wanted to see things improved but nonetheless he tells me that it is what it is. Fortunately he does seem to be doing better he tells me that her daughter from the [place] is actually down helping to take care of them out as he and his wife while he is recovering. 08/08/2020 upon evaluation today patient appears to be doing well currently in regard to his leg ulcers. He has been tolerating the dressing changes without complication. Fortunately there is no signs of active infection at this time. No fevers, chills, nausea,  vomiting, or diarrhea. 09/26/2020 upon evaluation today patient appears to be doing well with regard to his wound. He has been showing signs of good improvement which is great news and overall I am  extremely pleased with where things stand today. I do think that he does well with his dressing changes and overall has really improved significantly here. segment how that how she doing 11/14/2020 upon evaluation today patient appears to be doing well with regard to his leg ulcers. He has been tolerating the dressing changes without complication. Fortunately there does not appear to be any signs of active infection at this time. No fevers, chills, nausea, vomiting, or diarrhea. Objective Constitutional Well-nourished and well-hydrated in no acute distress. Vitals Time Taken: 12:54 PM, Height: 71 in, Weight: 220 lbs, BMI: 30.7, Temperature: 97.6 F, Pulse: 65 bpm, Respiratory Rate: 20 breaths/min, Blood Pressure: 179/75 mmHg. Respiratory normal breathing without difficulty. Psychiatric this patient is able to make decisions and demonstrates good insight into disease process. Alert and Oriented x 3. pleasant and cooperative. General Notes: Upon inspection patient's wound bed showed signs of good granulation epithelization at this point. Fortunately there does not appear to be any signs of active infection systemically which is great news and overall I am extremely pleased with where things stand. No fevers, chills, nausea, vomiting, or diarrhea. Integumentary (Hair, Skin) Wound #2 status is Open. Original cause of wound was Gradually Appeared. The date acquired was: 03/13/2017. The wound has been in treatment 163 weeks. The wound is located on the Left,Distal,Lateral Lower Leg. The wound measures 11.5cm length x 8.5cm width x 0.1cm depth; 76.773cm^2 area and 7.677cm^3 volume. There is Fat Layer (Subcutaneous Tissue) exposed. There is no tunneling or undermining noted. There is a large amount of  serosanguineous drainage noted. The wound margin is flat and intact. There is large (67-100%) red granulation within the wound bed. There is a small (1-33%) amount of necrotic tissue within the wound bed including Adherent Slough. Wound #5 status is Open. Original cause of wound was Gradually Appeared. The date acquired was: 10/26/2017. The wound has been in treatment 159 weeks. The wound is located on the Right,Lateral Lower Leg. The wound measures 2.9cm length x 4cm width x 0.1cm depth; 9.111cm^2 area and 0.911cm^3 volume. There is Fat Layer (Subcutaneous Tissue) exposed. There is no tunneling or undermining noted. There is a large amount of serosanguineous drainage noted. The wound margin is flat and intact. There is large (67-100%) red granulation within the wound bed. There is no necrotic tissue within the wound bed. 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 - on days that dressing is changed Edema Control - Lymphedema / SCD / Other: Elevate legs to the level of the heart or above for 30 minutes daily and/or when sitting, a frequency of: - throughout the day Avoid standing for long periods of time. Exercise regularly Moisturize legs daily. Compression stocking or Garment 20-30 mm/Hg pressure to: - Juxtalite to both legs daily WOUND #2: - Lower Leg Wound Laterality: Left, Lateral, Distal Cleanser: Soap and Water 2 x Per Week/30 Days Discharge Instructions: May shower and wash wound with dial antibacterial soap and water  prior to dressing change. Peri-Wound Care: Zinc Oxide Ointment 30g tube 2 x Per Week/30 Days Discharge Instructions: Apply Zinc Oxide to periwound with each dressing  change as needed for maceration Peri-Wound Care: Sween Lotion (Moisturizing lotion) 2 x Per Week/30 Days Discharge Instructions: Apply moisturizing lotion as directed Prim Dressing: Triple antibiotic ointment 2 x Per Week/30 Days ary Discharge Instructions: generic to neosporin Secondary Dressing: ABD Pad, 5x9 (Generic) 2 x Per Week/30 Days Discharge Instructions: Apply over primary dressing as directed. Secondary Dressing: Zetuvit Plus 4x8 in 2 x Per Week/30 Days Discharge Instructions: Apply over primary dressing as directed. Secondary Dressing: Xtrasorb Classic Super Absorbent Dressing, 6x9 (in/in) (Dispense As Written) 2 x Per Week/30 Days Discharge Instructions: Apply over primary dressing as directed. Secured With: Elastic Bandage 4 inch (ACE bandage) 2 x Per Week/30 Days Discharge Instructions: Secure with ACE bandage as directed. Secured With: [employer], 4.5x3.1 (in/yd) (Generic) 2 x Per Week/30 Days Discharge Instructions: Secure with Kerlix as directed. WOUND #5: - Lower Leg Wound Laterality: Right, Lateral Cleanser: Soap and Water 2 x Per Week/30 Days Discharge Instructions: May shower and wash wound with dial antibacterial soap and water prior to dressing change. Peri-Wound Care: Zinc Oxide Ointment 30g tube 2 x Per Week/30 Days Discharge Instructions: Apply Zinc Oxide to periwound with each dressing change as needed for maceration Peri-Wound Care: Sween Lotion (Moisturizing lotion) 2 x Per Week/30 Days Discharge Instructions: Apply moisturizing lotion as directed Prim Dressing: Triple antibiotic ointment 2 x Per Week/30 Days ary Discharge Instructions: generic to neosporin Secondary Dressing: ABD Pad, 5x9 (Generic) 2 x Per Week/30 Days Discharge Instructions: Apply over primary dressing as  directed. Secondary Dressing: Zetuvit Plus 4x8 in 2 x Per Week/30 Days Discharge Instructions: Apply over primary dressing as directed. Secondary Dressing: Xtrasorb Classic Super Absorbent Dressing, 6x9 (in/in) (Dispense As Written) 2 x Per Week/30 Days Discharge Instructions: Apply over primary dressing as directed. Secured With: Elastic Bandage 4 inch (ACE bandage) 2 x Per Week/30 Days Discharge Instructions: Secure with ACE bandage as directed. Secured With: [employer], 4.5x3.1 (in/yd) (Generic) 2 x Per Week/30 Days Discharge Instructions: Secure with Kerlix as directed. 1. Would recommend that we actually have the patient continue with his current measures he is using the over-the-counter triple antibiotic ointment and then following this putting XtraSorb on still continue with the compression and overall this seems to be doing quite well he is changing this twice a week. In fact he has significantly improved even compared to last time I saw him. 2. I am also can recommend that we continue with him the XtraSorb as well as the Ace wraps to cover to help with edema control. I do believe he is still using his juxta lite compression wraps over top of everything as well. We will see patient back for reevaluation in 6 weeks here in the clinic. If anything worsens or changes patient will contact our office for additional recommendations. Electronic Signature(s) Signed: 11/14/2020 1:17:48 PM By: Worthy Keeler PA-C Entered By: Worthy Keeler on 11/14/2020 13:17:47 -------------------------------------------------------------------------------- SuperBill Details Patient Name: Date of Service: Jeris White, GO RDO [name] 11/14/2020 Medical Record Number: [id number] Patient Account Number: [id number] Date of Birth/Sex: Treating RN: 06-17-1940 (80 y.o. [name] Primary Care Provider: [name] Other Clinician: Referring Provider: Treating Provider/Extender: [name] in Treatment: 163 Diagnosis Coding ICD-10 Codes Code Description I87.2 Venous insufficiency (chronic) (peripheral) Q81.8 Other epidermolysis bullosa L97.322 Non-pressure chronic ulcer of left ankle with fat layer exposed L97.822 Non-pressure chronic ulcer of other part of left lower leg with fat layer exposed L97.812 Non-pressure chronic ulcer of other  part of right lower leg with fat layer exposed [place] (primary) hypertension Facility Procedures CPT4 Code: [id number] Description: [id number] - WOUND CARE VISIT-LEV 5 EST PT Modifier: Quantity: 1 Physician Procedures : CPT4 Code Description Modifier [id number] - WC PHYS LEVEL 4 - EST PT ICD-10 Diagnosis Description I87.2 Venous insufficiency (chronic) (peripheral) Q81.8 Other epidermolysis bullosa L97.322 Non-pressure chronic ulcer of left ankle with fat layer  exposed L97.822 Non-pressure chronic ulcer of other part of left lower leg with fat layer exposed Quantity: 1 Electronic Signature(s) Signed: 11/14/2020 1:18:01 PM By: Worthy Keeler PA-C Entered By: Worthy Keeler on 11/14/2020 13:18:01

## 2020-11-15 NOTE — Progress Notes (Signed)
JAYSIN, GAYLER ([id number]) Visit Report for 11/14/2020 Arrival Information Details Patient Name: Date of Service: [name] 11/14/2020 12:30 PM Medical Record Number: [id number] Patient Account Number: [id number] Date of Birth/Sex: Treating RN: 10-28-1940 (80 y.o. [name], [name] Primary Care Isaia Hassell: [name] Other Clinician: Referring Keyairra Kolinski: Treating Carlye Panameno/Extender: [name] in Treatment: 47 Visit Information History Since Last Visit Added or deleted any medications: No Patient Arrived: Ambulatory Any new allergies or adverse reactions: No Arrival Time: 12:54 Had a fall or experienced change in No Accompanied By: self activities of daily living that may affect Transfer Assistance: None risk of falls: Patient Identification Verified: Yes Signs or symptoms of abuse/neglect since last visito No Secondary Verification Process Completed: Yes Hospitalized since last visit: No Patient Requires Transmission-Based Precautions: No Implantable device outside of the clinic excluding No Patient Has Alerts: Yes cellular tissue based products placed in the center Patient Alerts: R ABI= 1.23, TBI = .86 since last visit: L ABI= 1.28, TBI=.81 Has Dressing in Place as Prescribed: Yes Has Compression in Place as Prescribed: Yes Pain Present Now: No Electronic Signature(s) Signed: 11/15/2020 6:11:26 PM By: [name] RN, BSN Entered By: [name] on 11/14/2020 12:54:24 -------------------------------------------------------------------------------- Clinic Level of Care Assessment Details Patient Name: Date of Service: [name] 11/14/2020 12:30 PM Medical Record Number: [id number] Patient Account Number: [id number] Date of Birth/Sex: Treating RN: Jul 23, 1940 (80 y.o. [name] Primary Care Essense Bousquet: [name] Other Clinician: Referring Alithea Lapage: Treating Gwendolin Briel/Extender: [name] in Treatment:  [facility] Level of Care Assessment Items TOOL 4 Quantity Score X- 1 0 Use when only an EandM is performed on FOLLOW-UP visit ASSESSMENTS - Nursing Assessment / Reassessment X- 1 10 Reassessment of Co-morbidities (includes updates in patient status) X- 1 5 Reassessment of Adherence to Treatment Plan ASSESSMENTS - Wound and Skin A ssessment / Reassessment []  - 0 Simple Wound Assessment / Reassessment - one wound X- 2 5 Complex Wound Assessment / Reassessment - multiple wounds X- 1 10 Dermatologic / Skin Assessment (not related to wound area) ASSESSMENTS - Focused Assessment X- 2 5 Circumferential Edema Measurements - multi extremities X- 1 10 Nutritional Assessment / Counseling / Intervention []  - 0 Lower Extremity Assessment (monofilament, tuning fork, pulses) []  - 0 Peripheral Arterial Disease Assessment (using hand held doppler) ASSESSMENTS - Ostomy and/or Continence Assessment and Care []  - 0 Incontinence Assessment and Management []  - 0 Ostomy Care Assessment and Management (repouching, etc.) PROCESS - Coordination of Care []  - 0 Simple Patient / Family Education for ongoing care X- 1 20 Complex (extensive) Patient / Family Education for ongoing care X- 1 10 Staff obtains Programmer, systems, Records, T Results / Process Orders est []  - 0 Staff telephones HHA, Nursing Homes / Clarify orders / etc []  - 0 Routine Transfer to another Facility (non-emergent condition) []  - 0 Routine Hospital Admission (non-emergent condition) []  - 0 New Admissions / Biomedical engineer / Ordering NPWT Apligraf, etc. , []  - 0 Emergency Hospital Admission (emergent condition) []  - 0 Simple Discharge Coordination X- 1 15 Complex (extensive) Discharge Coordination PROCESS - Special Needs []  - 0 Pediatric / Minor Patient Management []  - 0 Isolation Patient Management []  - 0 Hearing / Language / Visual special needs []  - 0 Assessment of Community assistance (transportation, D/C  planning, etc.) []  - 0 Additional assistance / Altered mentation []  - 0 Support Surface(s) Assessment (bed, cushion, seat, etc.) INTERVENTIONS -  Wound Cleansing / Measurement []  - 0 Simple Wound Cleansing - one wound X- 2 5 Complex Wound Cleansing - multiple wounds X- 1 5 Wound Imaging (photographs - any number of wounds) []  - 0 Wound Tracing (instead of photographs) []  - 0 Simple Wound Measurement - one wound X- 2 5 Complex Wound Measurement - multiple wounds INTERVENTIONS - Wound Dressings []  - 0 Small Wound Dressing one or multiple wounds X- 2 15 Medium Wound Dressing one or multiple wounds []  - 0 Large Wound Dressing one or multiple wounds []  - 0 Application of Medications - topical []  - 0 Application of Medications - injection INTERVENTIONS - Miscellaneous []  - 0 External ear exam []  - 0 Specimen Collection (cultures, biopsies, blood, body fluids, etc.) []  - 0 Specimen(s) / Culture(s) sent or taken to Lab for analysis []  - 0 Patient Transfer (multiple staff / Civil Service fast streamer / Similar devices) []  - 0 Simple Staple / Suture removal (25 or less) []  - 0 Complex Staple / Suture removal (26 or more) []  - 0 Hypo / Hyperglycemic Management (close monitor of Blood Glucose) []  - 0 Ankle / Brachial Index (ABI) - do not check if billed separately X- 1 5 Vital Signs Has the patient been seen at the hospital within the last three years: Yes Total Score: 160 Level Of Care: New/Established - Level 5 Electronic Signature(s) Signed: 11/15/2020 6:11:26 PM By: [name] RN, BSN Entered By: [name] on 11/14/2020 13:10:56 -------------------------------------------------------------------------------- Encounter Discharge Information Details Patient Name: Date of Service: [name], GO RDO N H. 11/14/2020 12:30 PM Medical Record Number: [id number] Patient Account Number: [id number] Date of Birth/Sex: Treating RN: [birth year] (80 y.o. [name] Primary Care Zaria Taha:  [name] Other Clinician: Referring Chimene Salo: Treating Alette Kataoka/Extender: [name] in Treatment: [phone] Encounter Discharge Information Items Discharge Condition: Stable Ambulatory Status: Ambulatory Discharge Destination: Home Transportation: Private Auto Accompanied By: self Schedule Follow-up Appointment: Yes Clinical Summary of Care: Electronic Signature(s) Signed: 11/15/2020 6:11:26 PM By: [name] RN, BSN Entered By: [name] on 11/14/2020 13:12:35 -------------------------------------------------------------------------------- Lower Extremity Assessment Details Patient Name: Date of Service: [name] RDO N H. 11/14/2020 12:30 PM Medical Record Number: [id number] Patient Account Number: [id number] Date of Birth/Sex: Treating RN: 1940/09/24 (80 y.o. [name] Primary Care Michaelia Beilfuss: [name] Other Clinician: Referring Kem Parcher: Treating Ziara Thelander/Extender: [name] in Treatment: 163 Edema Assessment Assessed: Shirlyn Goltz: Yes] Patrice Paradise: Yes] Edema: [Left: Yes] [Right: Yes] Calf Left: Right: Point of Measurement: 35.5 cm From Medial Instep 32.5 cm 34 cm Ankle Left: Right: Point of Measurement: 11 cm From Medial Instep 27 cm 23 cm Vascular Assessment Pulses: Dorsalis Pedis Palpable: [Left:Yes] [Right:Yes] Electronic Signature(s) Signed: 11/15/2020 6:11:26 PM By: [name] RN, BSN Entered By: [name] on 11/14/2020 12:56:35 -------------------------------------------------------------------------------- [place] Details Patient Name: Date of Service: [name], [place]. 11/14/2020 12:30 PM Medical Record Number: [id number] Patient Account Number: [id number] Date of Birth/Sex: Treating RN: Nov 03, 1940 (80 y.o. [name] Primary Care Ambera Fedele: [name] Other Clinician: Referring Rober Skeels: Treating Adelena Desantiago/Extender: [name] in Treatment:  [place] reviewed with physician Active Inactive Venous Leg Ulcer Nursing Diagnoses: Knowledge deficit related to disease process and management Potential for venous Insuffiency (use before diagnosis confirmed) Goals: Patient will maintain optimal edema control Date Initiated: 09/30/2017 Target Resolution Date: 01/04/2021 Goal Status: Active Patient/caregiver will verbalize understanding of disease process and disease management Date Initiated: 09/30/2017 Date  Inactivated: 11/04/2017 Target Resolution Date: 10/30/2017 Goal Status: Met Interventions: Assess peripheral edema status every visit. Provide education on venous insufficiency Notes: Wound/Skin Impairment Nursing Diagnoses: Impaired tissue integrity Knowledge deficit related to ulceration/compromised skin integrity Goals: Patient/caregiver will verbalize understanding of skin care regimen Date Initiated: 09/30/2017 Target Resolution Date: 01/04/2021 Goal Status: Active Ulcer/skin breakdown will have a volume reduction of 30% by week 4 Date Initiated: 09/30/2017 Date Inactivated: 11/04/2017 Target Resolution Date: 10/30/2017 Goal Status: Met Interventions: Assess patient/caregiver ability to obtain necessary supplies Assess patient/caregiver ability to perform ulcer/skin care regimen upon admission and as needed Assess ulceration(s) every visit Provide education on ulcer and skin care Notes: Electronic Signature(s) Signed: 11/15/2020 6:11:26 PM By: [name] RN, BSN Entered By: [name] on 11/14/2020 13:00:48 -------------------------------------------------------------------------------- Pain Assessment Details Patient Name: Date of Service: [name], GO RDO N H. 11/14/2020 12:30 PM Medical Record Number: [id number] Patient Account Number: [id number] Date of Birth/Sex: Treating RN: [birth year] (80 y.o. [name] Primary Care Raychel Dowler: [name] Other Clinician: Referring  Mikey Maffett: Treating Corri Delapaz/Extender: [name] in Treatment: [phone] Active Problems Location of Pain Severity and Description of Pain Patient Has Paino No Site Locations Rate the pain. Current Pain Level: 0 Pain Management and Medication Current Pain Management: Medication: No Cold Application: No Rest: No Massage: No Activity: No T.E.N.S.: No Heat Application: No Leg drop or elevation: No Is the Current Pain Management Adequate: Adequate How does your wound impact your activities of daily livingo Sleep: No Bathing: No Appetite: No Relationship With Others: No Bladder Continence: No Emotions: No Bowel Continence: No Work: No Toileting: No Drive: No Dressing: No Hobbies: No Engineer, maintenance) Signed: 11/15/2020 6:11:26 PM By: [name] RN, BSN Entered By: [name] on 11/14/2020 12:54:47 -------------------------------------------------------------------------------- Patient/Caregiver Education Details Patient Name: Date of Service: [name] 11/2/2022andnbsp12:30 PM Medical Record Number: [id number] Patient Account Number: [id number] Date of Birth/Gender: Treating RN: 13-Jun-1940 (80 y.o. [name] Primary Care Physician: [name] Other Clinician: Referring Physician: Treating Physician/Extender: [name] in Treatment: 16 Education Assessment Education Provided To: Patient Education Topics Provided Wound/Skin Impairment: Handouts: Skin Care Do's and Dont's Methods: Explain/Verbal Responses: Reinforcements needed Electronic Signature(s) Signed: 11/15/2020 6:11:26 PM By: [name] RN, BSN Entered By: [name] on 11/14/2020 13:01:22 -------------------------------------------------------------------------------- Wound Assessment Details Patient Name: Date of Service: [name] RDO N H. 11/14/2020 12:30 PM Medical Record Number: [id number] Patient Account Number:  [id number] Date of Birth/Sex: Treating RN: 07-Sep-1940 (80 y.o. [name] Primary Care Ryan Ogborn: [name] Other Clinician: Referring Jamee Pacholski: Treating Miquan Tandon/Extender: [name] in Treatment: 163 Wound Status Wound Number: 2 Primary Venous Leg Ulcer Etiology: Wound Location: Left, Distal, Lateral Lower Leg Wound Status: Open Wounding Event: Gradually Appeared Comorbid Cataracts, Chronic Obstructive Pulmonary Disease (COPD), Date Acquired: 03/13/2017 History: Hypertension Weeks Of Treatment: 163 Clustered Wound: Yes Photos Wound Measurements Length: (cm) 11. Width: (cm) 8.5 Depth: (cm) 0.1 Clustered Quantity: 4 Area: (cm) 76 Volume: (cm) 7. 5 % Reduction in Area: 1% % Reduction in Volume: 80.2% Epithelialization: Medium (34-66%) Tunneling: No .773 Undermining: No 677 Wound Description Classification: Full Thickness Without Exposed Support Structures Wound Margin: Flat and Intact Exudate Amount: Large Exudate Type: Serosanguineous Exudate Color: red, brown Foul Odor After Cleansing: No Slough/Fibrino Yes Wound Bed Granulation Amount: Large (67-100%) Exposed Structure Granulation Quality: Red Fascia Exposed: No Necrotic Amount: Small (1-33%) Fat Layer (Subcutaneous Tissue) Exposed: Yes Necrotic Quality: Adherent Slough  Tendon Exposed: No Muscle Exposed: No Joint Exposed: No Bone Exposed: No Treatment Notes Wound #2 (Lower Leg) Wound Laterality: Left, Lateral, Distal Cleanser Soap and Water Discharge Instruction: May shower and wash wound with dial antibacterial soap and water prior to dressing change. Peri-Wound Care Zinc Oxide Ointment 30g tube Discharge Instruction: Apply Zinc Oxide to periwound with each dressing change as needed for maceration Sween Lotion (Moisturizing lotion) Discharge Instruction: Apply moisturizing lotion as directed Topical Primary Dressing Triple antibiotic ointment Discharge Instruction:  generic to neosporin Secondary Dressing ABD Pad, 5x9 Discharge Instruction: Apply over primary dressing as directed. Zetuvit Plus 4x8 in Discharge Instruction: Apply over primary dressing as directed. Xtrasorb Classic Super [employer], 6x9 (in/in) Discharge Instruction: Apply over primary dressing as directed. Secured With Elastic Bandage 4 inch (ACE bandage) Discharge Instruction: Secure with ACE bandage as directed. Kerlix Roll Sterile, 4.5x3.1 (in/yd) Discharge Instruction: Secure with Kerlix as directed. Compression Wrap Compression Stockings Add-Ons Electronic Signature(s) Signed: 11/15/2020 10:21:29 AM By: Sandre Kitty Signed: 11/15/2020 6:11:26 PM By: [name] RN, BSN Entered By: Sandre Kitty on 11/14/2020 13:01:16 -------------------------------------------------------------------------------- Wound Assessment Details Patient Name: Date of Service: [name], GO RDO N H. 11/14/2020 12:30 PM Medical Record Number: [id number] Patient Account Number: [id number] Date of Birth/Sex: Treating RN: 04-Mar-1940 (80 y.o. [name] Primary Care Riyanshi Wahab: [name] Other Clinician: Referring Kamden Reber: Treating Joyice Magda/Extender: [name] in Treatment: 163 Wound Status Wound Number: 5 Primary Vasculopathy Etiology: Wound Location: Right, Lateral Lower Leg Wound Status: Open Wounding Event: Gradually Appeared Comorbid Cataracts, Chronic Obstructive Pulmonary Disease (COPD), Date Acquired: 10/26/2017 History: Hypertension Weeks Of Treatment: 159 Clustered Wound: Yes Photos Wound Measurements Length: (cm) 2.9 Width: (cm) 4 Depth: (cm) 0.1 Area: (cm) 9.111 Volume: (cm) 0.911 % Reduction in Area: 83.2% % Reduction in Volume: 83.2% Epithelialization: Large (67-100%) Tunneling: No Undermining: No Wound Description Classification: Full Thickness Without Exposed Support Structures Wound Margin: Flat and Intact Exudate  Amount: Large Exudate Type: Serosanguineous Exudate Color: red, brown Foul Odor After Cleansing: No Slough/Fibrino No Wound Bed Granulation Amount: Large (67-100%) Exposed Structure Granulation Quality: Red Fascia Exposed: No Necrotic Amount: None Present (0%) Fat Layer (Subcutaneous Tissue) Exposed: Yes Tendon Exposed: No Muscle Exposed: No Joint Exposed: No Bone Exposed: No Treatment Notes Wound #5 (Lower Leg) Wound Laterality: Right, Lateral Cleanser Soap and Water Discharge Instruction: May shower and wash wound with dial antibacterial soap and water prior to dressing change. Peri-Wound Care Zinc Oxide Ointment 30g tube Discharge Instruction: Apply Zinc Oxide to periwound with each dressing change as needed for maceration Sween Lotion (Moisturizing lotion) Discharge Instruction: Apply moisturizing lotion as directed Topical Primary Dressing Triple antibiotic ointment Discharge Instruction: generic to neosporin Secondary Dressing ABD Pad, 5x9 Discharge Instruction: Apply over primary dressing as directed. Zetuvit Plus 4x8 in Discharge Instruction: Apply over primary dressing as directed. Xtrasorb Classic Super [employer], 6x9 (in/in) Discharge Instruction: Apply over primary dressing as directed. Secured With Elastic Bandage 4 inch (ACE bandage) Discharge Instruction: Secure with ACE bandage as directed. Kerlix Roll Sterile, 4.5x3.1 (in/yd) Discharge Instruction: Secure with Kerlix as directed. Compression Wrap Compression Stockings Add-Ons Electronic Signature(s) Signed: 11/15/2020 10:21:29 AM By: Sandre Kitty Signed: 11/15/2020 6:11:26 PM By: [name] RN, BSN Entered By: Sandre Kitty on 11/14/2020 13:00:24 -------------------------------------------------------------------------------- Vitals Details Patient Name: Date of Service: [name], GO RDO N H. 11/14/2020 12:30 PM Medical Record Number: [id number] Patient Account Number:  [id number] Date of Birth/Sex: Treating RN: [birth year] (80 y.o. [name] Primary Care  Jamier Urbas: [name] Other Clinician: Referring Gabriell Daigneault: Treating Janyla Biscoe/Extender: [name] in Treatment: 163 Vital Signs Time Taken: 12:54 Temperature (F): 97.6 Height (in): 71 Pulse (bpm): 65 Weight (lbs): 220 Respiratory Rate (breaths/min): 20 Body Mass Index (BMI): 30.7 Blood Pressure (mmHg): 179/75 Reference Range: 80 - 120 mg / dl Electronic Signature(s) Signed: 11/15/2020 6:11:26 PM By: [name] RN, BSN Entered By: [name] on 11/14/2020 12:54:38

## 2020-12-13 DIAGNOSIS — Z86010 Personal history of colonic polyps: Secondary | ICD-10-CM | POA: Diagnosis not present

## 2020-12-13 DIAGNOSIS — D128 Benign neoplasm of rectum: Secondary | ICD-10-CM | POA: Diagnosis not present

## 2020-12-13 DIAGNOSIS — D125 Benign neoplasm of sigmoid colon: Secondary | ICD-10-CM | POA: Diagnosis not present

## 2020-12-13 DIAGNOSIS — K5730 Diverticulosis of large intestine without perforation or abscess without bleeding: Secondary | ICD-10-CM | POA: Diagnosis not present

## 2020-12-17 DIAGNOSIS — D128 Benign neoplasm of rectum: Secondary | ICD-10-CM | POA: Diagnosis not present

## 2020-12-17 DIAGNOSIS — D125 Benign neoplasm of sigmoid colon: Secondary | ICD-10-CM | POA: Diagnosis not present

## 2020-12-26 ENCOUNTER — Encounter (HOSPITAL_BASED_OUTPATIENT_CLINIC_OR_DEPARTMENT_OTHER): Payer: Medicare PPO | Attending: Physician Assistant | Admitting: Physician Assistant

## 2020-12-26 ENCOUNTER — Other Ambulatory Visit: Payer: Self-pay

## 2020-12-26 DIAGNOSIS — L97812 Non-pressure chronic ulcer of other part of right lower leg with fat layer exposed: Secondary | ICD-10-CM | POA: Diagnosis not present

## 2020-12-26 DIAGNOSIS — Q819 Epidermolysis bullosa, unspecified: Secondary | ICD-10-CM | POA: Diagnosis not present

## 2020-12-26 DIAGNOSIS — L97822 Non-pressure chronic ulcer of other part of left lower leg with fat layer exposed: Secondary | ICD-10-CM | POA: Insufficient documentation

## 2020-12-26 DIAGNOSIS — I872 Venous insufficiency (chronic) (peripheral): Secondary | ICD-10-CM | POA: Insufficient documentation

## 2020-12-26 DIAGNOSIS — L97322 Non-pressure chronic ulcer of left ankle with fat layer exposed: Secondary | ICD-10-CM | POA: Diagnosis not present

## 2020-12-26 DIAGNOSIS — I10 Essential (primary) hypertension: Secondary | ICD-10-CM | POA: Insufficient documentation

## 2020-12-26 NOTE — Progress Notes (Addendum)
[name], [name] ([id number]) Visit Report for 12/26/2020 Chief Complaint Document Details Patient Name: Date of Service: Carlos White 12/26/2020 12:30 PM Medical Record Number: [id number] Patient Account Number: [id number] Date of Birth/Sex: Treating RN: 1940/01/24 (80 y.o. [name] Primary Care Provider: [name] Other Clinician: Referring Provider: Treating Provider/Extender: [name] in Treatment: [place] from: Patient Chief Complaint Bilateral LE Ulcers Electronic Signature(s) Signed: 12/26/2020 1:01:02 PM By: Worthy Keeler PA-C Entered By: Worthy Keeler on 12/26/2020 13:01:02 -------------------------------------------------------------------------------- HPI Details Patient Name: Date of Service: Carlos White, GO RDO N H. 12/26/2020 12:30 PM Medical Record Number: [id number] Patient Account Number: [id number] Date of Birth/Sex: Treating RN: January 10, 1941 (80 y.o. [name] Primary Care Provider: [name] Other Clinician: Referring Provider: Treating Provider/Extender: [name] in Treatment: 169 History of Present Illness HPI Description: 09/30/17 on evaluation today patient presents for initial evaluation and our clinic concerning issues that he has been having with his bilateral lower extremities. He states this has been going on for quite some time at least six months. Currently his regiment has been mainly cleaning the area with peroxide, applying the is foreign ointment, and wrapping the area with ABD pads and then an ace wrap loosely. He has dealt with issues of this nature he tells me for quite some time. He does have a history of having had a compound fracture of the left lower extremity which he thinks also makes this a much more difficult area for him to heal. He's previously been told that he had poor vascular flow but this was years ago at [facility] we do not have any of those  records at this time. He has a history of Epidermolysis Bullosa which was diagnosed around age 62 and he has been cared for at [facility] since that time. Subsequently he states this is hereditary and two of his children one male and one male also have this as well is one of his grandchildren that he is aware of. He has no evidence of infection necessarily at this point although he does have some necrotic tissue noted on the surface of the wound as far as the largest, left lateral lower extremity ulcer, is concerned. Overall I feel like all things considered he's been taking care of this very well. Obviously he has some fairly significant issues going on at this point in this regard. He does have a history otherwise of hypertension though for the most part other than the compound fracture of the left leg he seems to have been fairly healthy in my pinion. 10/07/17 on evaluation today patient actually appears to be doing better in regard to his bilateral lower extremity ulcers. With that being said he does still have some evidence of slough noted on the surface of the wounds I think the Iodoflex has been beneficial for him. His arterial studies are scheduled for October 2. With that being said I do believe that he is continuing to show signs of good improvement which is at least good news. 10/14/17 on evaluation today patient appears to be doing very well in regard to his lower extremity ulcers. He's definitely made some progress as far as healing is concerned although there still are several open areas that are going to need to be addressed. He did have his arterial study today which fortunately shows good findings with a right ABI of 1.23 with a TBI of 0.86 in the left ABI  of 1.28 with a TBI of 0.81. This is good news and will allow [place] to perform debridement as well. 10/23/2017; patient with a large wound on the left lateral calf, sizable area on the left medial malleolus and an area on the right lateral  malleolus. He has a new blister consistent with his underlying blistering skin disease just above this area we have been using Iodoflex on the lateral left calf lateral right ankle and collagen on the medial left ankle. We have been using Kerlix Coban wraps 10/28/17 on evaluation today the patient continues to have signs of improvement in regard to the overall appearance of the original wound. Unfortunately he did have some blistering over the right lateral lower extremity which has appeared to rupture on evaluation today and likely some of the dead tissue on the surface needs to be cleaned away the good news is this does not appear to be to significantly deep at this time. 11/04/17 on evaluation today patient actually appears to be doing a little better in regard to his lower extremity ulcers. He has been tolerating the dressing changes without complication. With that being said he does still have a significant wound especially over the left lateral lower extremity unfortunately. All of the wounds pretty much are going to require sharp debridement today. 11/11/17 on evaluation today patient appears to be doing more poorly in regard to his left lower extremity in particular. There does not appear to be any evidence of systemic infection although the wound itself as far as the larger left lateral lower extremity ulcer actually appears to be infected in my pinion. There's an older and the surface of the wound is dramatically worsened compared to last week. No fevers, chills, nausea, or vomiting noted at this time. 11/18/17 upon evaluation today patient actually appears to be doing better. I did review his culture today which really did not show any specific organism is a positive reason for his wound decline. There are multiple organisms present not predominant. Nonetheless he seems to be tolerate the doxycycline well in his wounds in general do seem to be doing better. Fortunately there does not appear  to be any evidence of infection at this time which is good news. Overall I'm very pleased with how things appear. Nonetheless he still has a lot of healing to go. I do think he could benefit from a Juxta-Lite wrap. 11/25/17 on evaluation today patient actually appears to be doing fairly well in regard to his wounds. He is still taking the antibiotics he has a few days left. Fortunately this seems to have been excellent for him as far as getting the infection control and very happy in this regard. With that being said the patient likewise is also very pleased with how things appear at this time in comparison to where we were he's not having as much pain. 12/02/17 Seen today for follow p and management of LLE wounds. Wounds appear to show some improvement. He denies pain, fever, or chills. Completed a course of doxycycline earlier this month. Scheduled to received Juxta-Lite wrap this week. No s/s of infections. 12/09/17 on evaluation today patient actually appears to be doing a little bit better in regard to his wounds. This is obscene very slow process and unfortunately he has a couple of new areas and this is due to the Epidermolysis Bullosa. Nonetheless I am concerned about the fact that he seems to be getting more areas not less that is the reason we're gonna work on  getting schedule for the vascular referral to see the venous specialist. 12/23/17 upon evaluation today patient's wounds currently shows evidence of still not doing quite as well is what I would like to have seen. Subsequently the patient did have his venous study which showed evidence of venous stasis. Subsequently I do think that a vascular evaluation for consideration of venous intervention would be appropriate. I'm not necessarily suggesting that will be anything that can be done but I think it is at least a good idea. He is in agreement with this plan. 12/30/17 on evaluation today patient actually appears to be doing very well in  regard to his wounds when compared to previous evaluation. Subsequently we have been using the [facility] Dressing which actually appears to have done excellent on his left lateral lower extremity ulcer. The quality of the wound surface is dramatically improved. There is some slight debridement that is going to be required at a couple of locations but overall I'm extremely pleased with how things appear here. 01/07/2018; this is a patient with a primary skin disorder epidermolyis bullosa. Is a large wound on the left lateral calf and smaller wounds on the right however there is a new wound on the right mid tibia area that occurred within the compression wrap that he did not change. We have been using Hydrofera Blue. On the left he is using Hydrofera Blue and Santyl to the inferior part of the wound and changing the dressing himself. 01/15/2018; primary skin disorder epidermolysis bullosa. He has several difficult wounds including the left lateral calf, smaller wounds on the left medial calf and the right lateral calf. The major area on the left lateral calf has a smaller area inferiorly that has necrotic debris we have been using Santyl to this. The rest of the wounds we have been using Hydrofera Blue. The area on the left calf actually looks larger this week. Uncontrolled edema several small open areas above it that are superficial 01/20/18 on evaluation today patient appears to be doing better as compared to last week in regard to his wounds of the bilateral lower extremities. He tolerated the bilateral compression wrap without complication. Overall I'm very pleased with how things appear at this time. The patient likewise is very happy. 01/27/18 on evaluation today patient appears to be doing decently well in regard to his bilateral lower extremity ulcers. He's been tolerating the dressing changes without complication. One issue he had is that he did have more drainage to the left leg wrapped last  week. He states in fact he probably should come in and let us change it on Friday however he just left it in place and kept adding extra absorption with ABD pads to the external portion of the wrap. Unfortunately he does have some aspiration type breakdown nothing significant but I do believe that this was probably counterproductive in general. Nonetheless his wounds do not appear to be terrible overall. 02/03/18 on evaluation today patient appears to be doing rather well in regard to his lower extremity ulcers. He has been tolerating the dressing changes without complication. He does tell me that he had to change the wrap on the left one since we last saw him. Subsequently I do not see any evidence of infection I do feel like the food was much better controlled at this point. 02/10/18 on evaluation today patient appears to be doing rather well in regard to his ulcers. He still has significant alterations especially on the left lateral lower extremity.  Fortunately there's no signs of infection at this time. Overall I feel like he is making good progress are some areas that I'm gonna attempt some debridement today. 02/17/18 on evaluation today patient appears to be doing okay in regard to his lower Trinity ulcer. It does appear on both locations he has a little bit of drainage causing some breakdown in maceration around the wound bed's although it doesn't appear to be too bad the right is a little bit worse than left. Fortunately there's no signs of infection which is good news. No fevers, chills, nausea, or vomiting noted at this time. 03/10/18 on evaluation today patient actually appears to be doing rather poorly in regard to his bilateral lower extremity ulcers. The right in particular is draining profusely and the wound is actually enlarging which is not good. I'm concerned about both possibly infection and the fact that there's a lot of moisture which is causing breakdown as well. Unfortunately the  patient has been trying to change this at home I'm afraid he may need to change more frequently in order to see the improvement that were looking for. There's no signs of systemic infection. 03/17/18 patient actually appears to be doing significantly better at this point in regard to his bilateral lower extremity ulcers. Fortunately there's no signs of infection. That is worsening infection at least indefinitely nothing systemic. With that being said he is having a lot of drainage though not quite as much is there in his last evaluation. Overall feel like he's on the side of improvement. I think if his results back from his culture which showed that he had a positive group B strep along with abundant Pseudomonas noted on the culture. For that reason I am gonna have him continue with the linezolid as we previously have ordered for him and I did go ahead as well today and prescribe Levaquin as well in order to treat the Pseudomonas portion of the infection noted. 03/24/18 on evaluation today patient actually appears to be doing very well in regard to his lower Trinity ulcer. He's been tolerating the dressing changes without complication. Fortunately both legs show signs of less drainage in his edema is very well controlled at this point as well. Overall very pleased with how things seem to be progressing. 03/31/18 on evaluation today patient actually appears to be doing excellent in regard to his bilateral lower extremity ulcers. These are not draining nearly as significant as what they were in the past overall seem to be shown signs of excellent improvement which is great news. Fortunately there is no sign of active infection at this time I do believe that the Levaquin has done extremely well for him in this regard. The patient continues to change these at home typically every day. We may be able to slowly work towards every other day changes since the drainage seems to be slowing down quite  significantly. 04/14/18 on evaluation today patient appears to be doing well in regard to his bilateral lower extremities. Let me Hilda Blades Almost completely healed which is excellent news. Fortunately he's shown signs of improvement all other sites as well with new skin growth there's some slight hyper granular tissue but for the most part this seems to be well maintained with the [facility] Dressing. I'm very happy in this regard. 04/28/18 on evaluation today patient appears to be doing rather well in regard to his ulcers of the bilateral lower extremities all things considering. He continues to make some progress as far  as new skin growth. There still some hyper granulation noted at this point despite the use of the [facility] Dressing. This is not terrible but I think we may want to consider conclude cauterization today with silver nitrate to try to help knock some of his back as well as helping with any biofilm on the surface of the wound. 05/12/18 on evaluation today patient's wounds actually appear to be doing fairly well in regard to the bilateral lower extremities. He's been tolerating the dressing changes without complication. Fortunately there's no signs of active infection at this time which is good news. Overall very pleased with how things seem to be progressing. You select silver nitrate was beneficial for him. 05/26/18 on evaluation today patient appears to be doing better in regard to left lower extremity and a little bit worse in regard to the right lower extremity. He states that he was pulling off the [facility] Dressing peel back some of the skin making this area significantly larger than what it was previous. He's not had any issues other than this and states even that hasn't caused any pain he just seems to obviously have a much larger area on the right when compared to the previous time I saw him. No fevers, chills, nausea, or vomiting noted at this time. 06/16/18 on  evaluation today patient actually appears to be doing a little better in my pinion in regard to his lower summary ulcers. He has new skin islands that seem to be spreading which is good news. Fortunately there's no signs of active infection at this time. His biggest issue is he tells me that coming as often as he does is becoming very cost prohibitive. He wonders if we can potentially spread this out. 07/14/18 on evaluation today patient appears to be doing a little bit worse in regard to his lower from the ulcer. Unfortunately he still continues to have a significant amount of drainage I think we need to do something to try to help this more. He is still somewhat reluctant to go the route of the Wound VAC although that may be the most appropriate thing for him. No fevers, chills, nausea, or vomiting noted at this time. 08/18/2018 on evaluation today patient actually appears to be doing quite well with regard to his bilateral lower extremity ulcers. I do feel like that currently he is making great progress the care max does seem to be doing a great job at helping to control the moisture he has no maceration or skin breakdown. Again this seems to be an excellent way to go. 1 thing we may want to change is adding collagen to the base of the wound and then the care max over top he is not opposed to this. 09/15/2018 on evaluation today patient appears to be doing well with regard to his bilateral lower extremity ulcers. He is showing some signs of improvement not necessarily in size but definitely in appearance. In fact he has a lot of new skin growing throughout the wounds along the edges as well as in the central portion of the wounds on both lower extremities. Overall I am extremely pleased to see this. 10/20/2018 on evaluation today patient actually appears to be doing quite well with regard to his wounds. They are not measuring significantly smaller but he does have a lot of new epithelization noted as  compared to previous. Fortunately there is no signs of active infection at this time. No fevers, chills, nausea, vomiting, or diarrhea. 11/17/2018  on evaluation today patient presents for reevaluation concerning his bilateral lower extremity ulcers. Fortunately there is no signs of active infection at this time today. He has been tolerating the dressing changes without complication. No fevers, chills, nausea, vomiting, or diarrhea. Unfortunately in general the patient has not made as much improvement as I would like to have seen up to this point. He has been tolerating the dressing changes without complication and he does an excellent job taking care of his wounds at home in my opinion. The biggest issue I see is that he is just not making the progress that we need to be seeing currently. I think we may want to consider having him seen at a plastic surgery appointment and he has previously seen someone in years past at [facility] in [place]. That is definitely a possibility for [place] to look into at this point. 12/29/2018 on evaluation today patient appears to be doing better in regard to the overall visual appearance of his wounds which do not appear to be as macerated. He does have a much larger skin island in the middle of the left lower extremity ulcer which is doing much better. He tells me the pain is also significantly better. With that being said overall his improvement as far as the size of the wounds is not better but again these are very irregular in change shape quite often. Fortunately there is no evidence of active infection at this time which is great news. He never heard anything from [facility] regarding the plastic surgery referral that we made to them. 01/26/2019 upon evaluation today patient appears to be doing a little better in regard to his wounds today. He has been tolerating the dressing changes again he performs these for the most part on his own. He does a great  job wrapping his legs in my opinion. Unfortunately he has not been able to get down to [facility] to see if there is anything from a plastic surgery standpoint that could be done to help with his legs simply due to the fact that his wife unfortunately sustained a compression fracture in her spine she is seeing Dr. Saintclair Halsted and subsequently is going to be having what sounds to be a kyphoplasty type procedure. With that being said that has not been scheduled yet there is still waiting on an MRI the patient is very busy in fact overly busy trying to help take care of his wife at this point. I completely understand this is more of a strain on him at this time 02/23/2019 upon evaluation today patient actually appears to be making some progress. I am actually very pleased with the overall appearance of his wounds even compared to last evaluation. He seems to be doing quite well. He is taking care of his wife unfortunately she did have a compression fracture she has had the procedure for this but still she has a slow road to recovery. For that reason he still not gone to [facility] for a second opinion in this regard. Obviously the goal there was if there was anything that can be done from a skin graft standpoint or otherwise. 03/23/2019 upon evaluation today patient continues to have issues with lower extremity ulcers. Since the beginning he has made progress but at the same time the wounds unfortunately just will not close. We have been trying to get the patient to [facility] to see a specialist there but unfortunately with the everything going on with his  wife he has not been able to make that appointment time yet he states he may be able to in the next 1-2 months but is not really sure. 04/27/2019 on evaluation today patient appears to be doing a little bit more poorly. His last evaluation. He appears to have some erythema around the edges of the wound at this point. Fortunately there is no  signs of active infection at this time which is good news. No fevers, chills, nausea, vomiting, or diarrhea. 06/08/2019 on evaluation today patient appears to be doing well with regard to his wounds. Overall they are actually measuring smaller compared to the last visit last month. We did treat him for Pseudomonas as well as methicillin-resistant Staph aureus. He was only on the treatment for MRSA however for 7 days as the Cipro was resistant and subsequently we had to place him on doxycycline. Nonetheless I am thinking that we may want to add the doxycycline and just do a month-long treatment considering the longstanding nature of his wounds and see if we get this under better control. 07/13/2019 upon evaluation today patient appears to be doing fairly well in regard to his bilateral lower extremities. There does not appear to be any signs of active infection which is good news. No fevers, chills, nausea, vomiting, or diarrhea. 08/10/2019 upon evaluation today patient appears to be doing about the same with regard to his wounds in general. Unfortunately he is not significantly better although is also not significantly worse which is great news there is no evidence of active infection at this time which is good news. He still dealing with a lot going on with his wife and therefore is not really able to go see anyone at the specialty clinic at [facility] that we have previously set up still. 09/07/2019 on evaluation today patient appears to be doing well with regard to his wounds. In fact this is probably the best that have seen so far in quite a few months. Overall I am very pleased with where things stand at this time. No fevers, chills, nausea, vomiting, or diarrhea. 10/05/2019 upon evaluation today patient appears to be doing more poorly in regard to his legs at this point. He actually is showing some signs of infection. This has been something that we seem to be back-and-forth with as far as trying to keep  these wounds from becoming infected. He takes care of them very well in my opinion but nonetheless I am concerned in this regard. He tells me that his wife is still doing really about the same she is slowly getting better. Nonetheless he still spends the majority of his time helping to take care of her. 11/02/2019 upon evaluation today patient actually appears to be doing somewhat better in regard to his legs. I do believe that the compounded antibiotic treatment from [facility] has been beneficial for him. Overall I am extremely pleased with where things stand today. There is no signs of active infection at this time. The patient states he has much less drainage than he has in the past. 12/07/2019 upon evaluation today patient appears to be doing well at this time in regard to the overall appearance of his wound bed. Currently there is no signs of active infection at this time. 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 Especially when  he stressed. Fortunately there is no signs of active infection locally or systemically at this point. 01/18/2020 on evaluation today patient appears to be doing well with regard to his leg ulcers. He has been tolerating the dressing changes without complication. Fortunately there is no signs of infection and overall I feel like his legs are doing about the best they have done in quite some time. There does not appear to be any evidence of active infection which is great news and overall very pleased. 02/29/2020 upon evaluation today patient's wounds actually appear to be doing quite well currently. There is no sign of active infection at this time. No fevers, chills, nausea, vomiting, or diarrhea. 04/11/2025 on evaluation today patient appears to be doing excellent in regard to his wounds on the legs to be  honest. He has made a lot of progress there does not appear to be any signs of active infection and overall I think that he is better than previous although still the wounds are quite significant obviously. In general I think that he is pleased with where things stand at this point. Still there is quite a bit of work to do here. 07/13/2020 upon evaluation today patient appears to be doing well with regard to his wound. Is been tolerating the dressing changes without complication on both legs. He just has the 2 main wounds on each leg at the ankle on the medial region of the right leg is completely healed. His wounds do seem to have gotten better during the time that he has been on bedrest as result of the pelvis fracture. Obviously does not the way that we wanted to see things improved but nonetheless he tells me that it is what it is. Fortunately he does seem to be doing better he tells me that her daughter from the [employer] is actually down helping to take care of them out as he and his wife while he is recovering. 08/08/2020 upon evaluation today patient appears to be doing well currently in regard to his leg ulcers. He has been tolerating the dressing changes without complication. Fortunately there is no signs of active infection at this time. No fevers, chills, nausea, vomiting, or diarrhea. 09/26/2020 upon evaluation today patient appears to be doing well with regard to his wound. He has been showing signs of good improvement which is great news and overall I am extremely pleased with where things stand today. I do think that he does well with his dressing changes and overall has really improved significantly here. segment how that how she doing 11/14/2020 upon evaluation today patient appears to be doing well with regard to his leg ulcers. He has been tolerating the dressing changes without complication. Fortunately there does not appear to be any signs of active infection at this time. No fevers,  chills, nausea, vomiting, or diarrhea. 12/26/2020 upon evaluation today patient actually appears to be making some good progress here in regard to his wounds. Fortunately I do not see any signs of active infection which is great news. No fevers, chills, nausea, vomiting, or diarrhea. Electronic Signature(s) Signed: 12/26/2020 1:17:02 PM By: Worthy Keeler PA-C Entered By: Worthy Keeler on 12/26/2020 13:17:01 -------------------------------------------------------------------------------- Physical Exam Details Patient Name: Date of Service: Carlos White 12/26/2020 12:30 PM Medical Record Number: [id number] Patient Account Number: [id number] Date of Birth/Sex: Treating RN: [birth year] (80 y.o. [name] Primary Care Provider: [name] Other Clinician: Referring Provider: Treating Provider/Extender: Virgina Jock, Timoteo Expose  in Treatment: 21 Constitutional Well-nourished and well-hydrated in no acute distress. Respiratory normal breathing without difficulty. Psychiatric this patient is able to make decisions and demonstrates good insight into disease process. Alert and Oriented x 3. pleasant and cooperative. Notes Upon inspection patient's wound bed again showed good epithelization and granulation and actually extremely pleased with where we stand today and I think the patient is making some good progress here. In fact I think everything appears to be smaller compared to last time I saw him. Electronic Signature(s) Signed: 12/26/2020 1:17:19 PM By: Worthy Keeler PA-C Entered By: Worthy Keeler on 12/26/2020 13:17:18 -------------------------------------------------------------------------------- Physician Orders Details Patient Name: Date of Service: [name] RDO N H. 12/26/2020 12:30 PM Medical Record Number: [id number] Patient Account Number: [id number] Date of Birth/Sex: Treating RN: 1940-09-08 (79 y.o. 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 (primary) hypertension Follow-up Appointments ppointment in: - 6 weeks with Margarita Grizzle Return A Other: - [facility]=Supplies Bathing/ Shower/ Hygiene May shower and wash wound with soap and water. - on days that dressing is changed Edema Control - Lymphedema / SCD / Other Bilateral Lower Extremities Elevate legs to the level of the heart or above for 30 minutes daily and/or when sitting, a frequency of: - throughout the day Avoid standing for long periods of time. Exercise regularly Moisturize legs daily. Compression stocking or Garment 20-30 mm/Hg pressure to: - Juxtalite to both legs daily Wound Treatment Wound #2 - Lower Leg Wound Laterality: Left, Lateral, Distal Cleanser: Soap and Water 2 x Per Week/30 Days Discharge Instructions: May shower and wash wound with dial antibacterial soap and water prior to dressing change. Peri-Wound Care: Zinc Oxide Ointment 30g tube 2 x Per Week/30 Days Discharge Instructions: Apply Zinc Oxide to periwound with each dressing change as needed for maceration Peri-Wound Care: Sween Lotion (Moisturizing lotion) 2 x Per Week/30 Days Discharge Instructions: Apply moisturizing lotion as directed Prim Dressing: Triple antibiotic ointment 2 x Per Week/30 Days ary Discharge Instructions: generic to neosporin Secondary Dressing: ABD Pad, 5x9 (Generic) 2 x Per Week/30 Days Discharge Instructions: Apply over primary  dressing as directed. Secondary Dressing: Xtrasorb Classic Super Absorbent Dressing, 6x9 (in/in) (Dispense As Written) 2 x Per Week/30 Days Discharge Instructions: Apply over primary dressing as directed. Secured With: Elastic Bandage 4 inch (ACE bandage) 2 x Per Week/30 Days Discharge Instructions: Secure with ACE bandage as directed. Secured With: [employer], 4.5x3.1 (in/yd) (Generic) 2 x Per Week/30 Days Discharge Instructions: Secure with Kerlix as directed. Wound #5 - Lower Leg Wound Laterality: Right, Lateral Cleanser: Soap and Water 2 x Per Week/30 Days Discharge Instructions: May shower and wash wound with dial antibacterial soap and water prior to dressing change. Peri-Wound Care: Zinc Oxide Ointment 30g tube 2 x Per Week/30 Days Discharge Instructions: Apply Zinc Oxide to periwound with each dressing change as needed for maceration Peri-Wound Care: Sween Lotion (Moisturizing lotion) 2 x Per Week/30 Days Discharge Instructions: Apply moisturizing lotion as directed Prim Dressing: Triple antibiotic ointment 2 x Per Week/30 Days ary Discharge  Instructions: generic to neosporin Secondary Dressing: ABD Pad, 5x9 (Generic) 2 x Per Week/30 Days Discharge Instructions: Apply over primary dressing as directed. Secondary Dressing: Xtrasorb Classic Super Absorbent Dressing, 6x9 (in/in) (Dispense As Written) 2 x Per Week/30 Days Discharge Instructions: Apply over primary dressing as directed. Secured With: Elastic Bandage 4 inch (ACE bandage) 2 x Per Week/30 Days Discharge Instructions: Secure with ACE bandage as directed. Secured With: [employer], 4.5x3.1 (in/yd) (Generic) 2 x Per Week/30 Days Discharge Instructions: Secure with Kerlix as directed. Electronic Signature(s) Signed: 12/26/2020 3:33:06 PM By: Worthy Keeler PA-C Signed: 12/26/2020 5:19:40 PM By: Lorrin Jackson Entered By: Lorrin Jackson on 12/26/2020  13:06:30 -------------------------------------------------------------------------------- Problem List Details Patient Name: Date of Service: Carlos White, GO RDO N H. 12/26/2020 12:30 PM Medical Record Number: [id number] Patient Account Number: [id number] Date of Birth/Sex: Treating RN: 07-22-1940 (80 y.o. 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 09/30/2017 No Yes L97.322 Non-pressure chronic ulcer of left ankle with fat layer exposed 09/30/2017 No Yes L97.822 Non-pressure chronic ulcer of other part of left lower leg with fat layer exposed9/18/2019 No Yes L97.812 Non-pressure chronic ulcer of other part of right lower leg with fat layer 09/30/2017 No Yes exposed [place] (primary) hypertension 09/30/2017 No Yes Inactive Problems Resolved Problems Electronic Signature(s) Signed: 12/26/2020 1:00:51 PM By: Worthy Keeler PA-C Entered By: Worthy Keeler on 12/26/2020 13:00:51 -------------------------------------------------------------------------------- Progress Note Details Patient Name: Date of Service: [name] RDO N H. 12/26/2020 12:30 PM Medical Record Number: [id number] Patient Account Number: [id number] Date of Birth/Sex: Treating RN: 26-Jun-1940 (80 y.o. [name] Primary Care Provider: [name] Other Clinician: Referring Provider: Treating Provider/Extender: [name] in Treatment: 169 Subjective Chief Complaint Information obtained from Patient Bilateral LE Ulcers History of Present Illness (HPI) 09/30/17 on evaluation today patient presents for initial evaluation and our clinic concerning issues that he has been having with  his bilateral lower extremities. He states this has been going on for quite some time at least six months. Currently his regiment has been mainly cleaning the area with peroxide, applying the is foreign ointment, and wrapping the area with ABD pads and then an ace wrap loosely. He has dealt with issues of this nature he tells me for quite some time. He does have a history of having had a compound fracture of the left lower extremity which he thinks also makes this a much more difficult area for him to heal. He's previously been told that he had poor vascular flow but this was years ago at [facility] we do not have any of those records at this time. He has a history of Epidermolysis Bullosa which was diagnosed around age 41 and he has been cared for at [facility] since that time. Subsequently he states this is hereditary and two of his children one male and one male also have this as well is one of his grandchildren that he is aware of. He has no evidence of infection necessarily at this point although he does have some necrotic tissue noted on the surface of the wound as far as the largest, left lateral lower extremity ulcer, is concerned. Overall I feel like all things considered he's been taking care of this very well. Obviously he has  some fairly significant issues going on at this point in this regard. He does have a history otherwise of hypertension though for the most part other than the compound fracture of the left leg he seems to have been fairly healthy in my pinion. 10/07/17 on evaluation today patient actually appears to be doing better in regard to his bilateral lower extremity ulcers. With that being said he does still have some evidence of slough noted on the surface of the wounds I think the Iodoflex has been beneficial for him. His arterial studies are scheduled for October 2. With that being said I do believe that he is continuing to show signs of good improvement which is at least good  news. 10/14/17 on evaluation today patient appears to be doing very well in regard to his lower extremity ulcers. He's definitely made some progress as far as healing is concerned although there still are several open areas that are going to need to be addressed. He did have his arterial study today which fortunately shows good findings with a right ABI of 1.23 with a TBI of 0.86 in the left ABI of 1.28 with a TBI of 0.81. This is good news and will allow [place] to perform debridement as well. 10/23/2017; patient with a large wound on the left lateral calf, sizable area on the left medial malleolus and an area on the right lateral malleolus. He has a new blister consistent with his underlying blistering skin disease just above this area we have been using Iodoflex on the lateral left calf lateral right ankle and collagen on the medial left ankle. We have been using Kerlix Coban wraps 10/28/17 on evaluation today the patient continues to have signs of improvement in regard to the overall appearance of the original wound. Unfortunately he did have some blistering over the right lateral lower extremity which has appeared to rupture on evaluation today and likely some of the dead tissue on the surface needs to be cleaned away the good news is this does not appear to be to significantly deep at this time. 11/04/17 on evaluation today patient actually appears to be doing a little better in regard to his lower extremity ulcers. He has been tolerating the dressing changes without complication. With that being said he does still have a significant wound especially over the left lateral lower extremity unfortunately. All of the wounds pretty much are going to require sharp debridement today. 11/11/17 on evaluation today patient appears to be doing more poorly in regard to his left lower extremity in particular. There does not appear to be any evidence of systemic infection although the wound itself as far as the  larger left lateral lower extremity ulcer actually appears to be infected in my pinion. There's an older and the surface of the wound is dramatically worsened compared to last week. No fevers, chills, nausea, or vomiting noted at this time. 11/18/17 upon evaluation today patient actually appears to be doing better. I did review his culture today which really did not show any specific organism is a positive reason for his wound decline. There are multiple organisms present not predominant. Nonetheless he seems to be tolerate the doxycycline well in his wounds in general do seem to be doing better. Fortunately there does not appear to be any evidence of infection at this time which is good news. Overall I'm very pleased with how things appear. Nonetheless he still has a lot of healing to go. I do think he could benefit from  a Juxta-Lite wrap. 11/25/17 on evaluation today patient actually appears to be doing fairly well in regard to his wounds. He is still taking the antibiotics he has a few days left. Fortunately this seems to have been excellent for him as far as getting the infection control and very happy in this regard. With that being said the patient likewise is also very pleased with how things appear at this time in comparison to where we were he's not having as much pain. 12/02/17 Seen today for follow p and management of LLE wounds. Wounds appear to show some improvement. He denies pain, fever, or chills. Completed a course of doxycycline earlier this month. Scheduled to received Juxta-Lite wrap this week. No s/s of infections. 12/09/17 on evaluation today patient actually appears to be doing a little bit better in regard to his wounds. This is obscene very slow process and unfortunately he has a couple of new areas and this is due to the Epidermolysis Bullosa. Nonetheless I am concerned about the fact that he seems to be getting more areas not less that is the reason we're gonna work on getting  schedule for the vascular referral to see the venous specialist. 12/23/17 upon evaluation today patient's wounds currently shows evidence of still not doing quite as well is what I would like to have seen. Subsequently the patient did have his venous study which showed evidence of venous stasis. Subsequently I do think that a vascular evaluation for consideration of venous intervention would be appropriate. I'm not necessarily suggesting that will be anything that can be done but I think it is at least a good idea. He is in agreement with this plan. 12/30/17 on evaluation today patient actually appears to be doing very well in regard to his wounds when compared to previous evaluation. Subsequently we have been using the [facility] Dressing which actually appears to have done excellent on his left lateral lower extremity ulcer. The quality of the wound surface is dramatically improved. There is some slight debridement that is going to be required at a couple of locations but overall I'm extremely pleased with how things appear here. 01/07/2018; this is a patient with a primary skin disorder epidermolyis bullosa. Is a large wound on the left lateral calf and smaller wounds on the right however there is a new wound on the right mid tibia area that occurred within the compression wrap that he did not change. We have been using Hydrofera Blue. On the left he is using Hydrofera Blue and Santyl to the inferior part of the wound and changing the dressing himself. 01/15/2018; primary skin disorder epidermolysis bullosa. He has several difficult wounds including the left lateral calf, smaller wounds on the left medial calf and the right lateral calf. The major area on the left lateral calf has a smaller area inferiorly that has necrotic debris we have been using Santyl to this. The rest of the wounds we have been using Hydrofera Blue. The area on the left calf actually looks larger this week. Uncontrolled  edema several small open areas above it that are superficial 01/20/18 on evaluation today patient appears to be doing better as compared to last week in regard to his wounds of the bilateral lower extremities. He tolerated the bilateral compression wrap without complication. Overall I'm very pleased with how things appear at this time. The patient likewise is very happy. 01/27/18 on evaluation today patient appears to be doing decently well in regard to his bilateral lower extremity  ulcers. He's been tolerating the dressing changes without complication. One issue he had is that he did have more drainage to the left leg wrapped last week. He states in fact he probably should come in and let us change it on Friday however he just left it in place and kept adding extra absorption with ABD pads to the external portion of the wrap. Unfortunately he does have some aspiration type breakdown nothing significant but I do believe that this was probably counterproductive in general. Nonetheless his wounds do not appear to be terrible overall. 02/03/18 on evaluation today patient appears to be doing rather well in regard to his lower extremity ulcers. He has been tolerating the dressing changes without complication. He does tell me that he had to change the wrap on the left one since we last saw him. Subsequently I do not see any evidence of infection I do feel like the food was much better controlled at this point. 02/10/18 on evaluation today patient appears to be doing rather well in regard to his ulcers. He still has significant alterations especially on the left lateral lower extremity. Fortunately there's no signs of infection at this time. Overall I feel like he is making good progress are some areas that I'm gonna attempt some debridement today. 02/17/18 on evaluation today patient appears to be doing okay in regard to his lower Trinity ulcer. It does appear on both locations he has a little bit of  drainage causing some breakdown in maceration around the wound bed's although it doesn't appear to be too bad the right is a little bit worse than left. Fortunately there's no signs of infection which is good news. No fevers, chills, nausea, or vomiting noted at this time. 03/10/18 on evaluation today patient actually appears to be doing rather poorly in regard to his bilateral lower extremity ulcers. The right in particular is draining profusely and the wound is actually enlarging which is not good. I'm concerned about both possibly infection and the fact that there's a lot of moisture which is causing breakdown as well. Unfortunately the patient has been trying to change this at home I'm afraid he may need to change more frequently in order to see the improvement that were looking for. There's no signs of systemic infection. 03/17/18 patient actually appears to be doing significantly better at this point in regard to his bilateral lower extremity ulcers. Fortunately there's no signs of infection. That is worsening infection at least indefinitely nothing systemic. With that being said he is having a lot of drainage though not quite as much is there in his last evaluation. Overall feel like he's on the side of improvement. I think if his results back from his culture which showed that he had a positive group B strep along with abundant Pseudomonas noted on the culture. For that reason I am gonna have him continue with the linezolid as we previously have ordered for him and I did go ahead as well today and prescribe Levaquin as well in order to treat the Pseudomonas portion of the infection noted. 03/24/18 on evaluation today patient actually appears to be doing very well in regard to his lower Trinity ulcer. He's been tolerating the dressing changes without complication. Fortunately both legs show signs of less drainage in his edema is very well controlled at this point as well. Overall very pleased with  how things seem to be progressing. 03/31/18 on evaluation today patient actually appears to be doing excellent in regard to  his bilateral lower extremity ulcers. These are not draining nearly as significant as what they were in the past overall seem to be shown signs of excellent improvement which is great news. Fortunately there is no sign of active infection at this time I do believe that the Levaquin has done extremely well for him in this regard. The patient continues to change these at home typically every day. We may be able to slowly work towards every other day changes since the drainage seems to be slowing down quite significantly. 04/14/18 on evaluation today patient appears to be doing well in regard to his bilateral lower extremities. Let me [facility] Almost completely healed which is excellent news. Fortunately he's shown signs of improvement all other sites as well with new skin growth there's some slight hyper granular tissue but for the most part this seems to be well maintained with the [facility] Dressing. I'm very happy in this regard. 04/28/18 on evaluation today patient appears to be doing rather well in regard to his ulcers of the bilateral lower extremities all things considering. He continues to make some progress as far as new skin growth. There still some hyper granulation noted at this point despite the use of the [facility] Dressing. This is not terrible but I think we may want to consider conclude cauterization today with silver nitrate to try to help knock some of his back as well as helping with any biofilm on the surface of the wound. 05/12/18 on evaluation today patient's wounds actually appear to be doing fairly well in regard to the bilateral lower extremities. He's been tolerating the dressing changes without complication. Fortunately there's no signs of active infection at this time which is good news. Overall very pleased with how things seem to  be progressing. You select silver nitrate was beneficial for him. 05/26/18 on evaluation today patient appears to be doing better in regard to left lower extremity and a little bit worse in regard to the right lower extremity. He states that he was pulling off the [facility] Dressing peel back some of the skin making this area significantly larger than what it was previous. He's not had any issues other than this and states even that hasn't caused any pain he just seems to obviously have a much larger area on the right when compared to the previous time I saw him. No fevers, chills, nausea, or vomiting noted at this time. 06/16/18 on evaluation today patient actually appears to be doing a little better in my pinion in regard to his lower summary ulcers. He has new skin islands that seem to be spreading which is good news. Fortunately there's no signs of active infection at this time. His biggest issue is he tells me that coming as often as he does is becoming very cost prohibitive. He wonders if we can potentially spread this out. 07/14/18 on evaluation today patient appears to be doing a little bit worse in regard to his lower from the ulcer. Unfortunately he still continues to have a significant amount of drainage I think we need to do something to try to help this more. He is still somewhat reluctant to go the route of the Wound VAC although that may be the most appropriate thing for him. No fevers, chills, nausea, or vomiting noted at this time. 08/18/2018 on evaluation today patient actually appears to be doing quite well with regard to his bilateral lower extremity ulcers. I do feel like that currently he is making  great progress the care max does seem to be doing a great job at helping to control the moisture he has no maceration or skin breakdown. Again this seems to be an excellent way to go. 1 thing we may want to change is adding collagen to the base of the wound and then the care max over top  he is not opposed to this. 09/15/2018 on evaluation today patient appears to be doing well with regard to his bilateral lower extremity ulcers. He is showing some signs of improvement not necessarily in size but definitely in appearance. In fact he has a lot of new skin growing throughout the wounds along the edges as well as in the central portion of the wounds on both lower extremities. Overall I am extremely pleased to see this. 10/20/2018 on evaluation today patient actually appears to be doing quite well with regard to his wounds. They are not measuring significantly smaller but he does have a lot of new epithelization noted as compared to previous. Fortunately there is no signs of active infection at this time. No fevers, chills, nausea, vomiting, or diarrhea. 11/17/2018 on evaluation today patient presents for reevaluation concerning his bilateral lower extremity ulcers. Fortunately there is no signs of active infection at this time today. He has been tolerating the dressing changes without complication. No fevers, chills, nausea, vomiting, or diarrhea. Unfortunately in general the patient has not made as much improvement as I would like to have seen up to this point. He has been tolerating the dressing changes without complication and he does an excellent job taking care of his wounds at home in my opinion. The biggest issue I see is that he is just not making the progress that we need to be seeing currently. I think we may want to consider having him seen at a plastic surgery appointment and he has previously seen someone in years past at [facility] in [place]. That is definitely a possibility for [place] to look into at this point. 12/29/2018 on evaluation today patient appears to be doing better in regard to the overall visual appearance of his wounds which do not appear to be as macerated. He does have a much larger skin island in the middle of the left lower extremity ulcer  which is doing much better. He tells me the pain is also significantly better. With that being said overall his improvement as far as the size of the wounds is not better but again these are very irregular in change shape quite often. Fortunately there is no evidence of active infection at this time which is great news. He never heard anything from [facility] regarding the plastic surgery referral that we made to them. 01/26/2019 upon evaluation today patient appears to be doing a little better in regard to his wounds today. He has been tolerating the dressing changes again he performs these for the most part on his own. He does a great job wrapping his legs in my opinion. Unfortunately he has not been able to get down to [facility] to see if there is anything from a plastic surgery standpoint that could be done to help with his legs simply due to the fact that his wife unfortunately sustained a compression fracture in her spine she is seeing Dr. Saintclair Halsted and subsequently is going to be having what sounds to be a kyphoplasty type procedure. With that being said that has not been scheduled yet there is still waiting on an MRI the patient is  very busy in fact overly busy trying to help take care of his wife at this point. I completely understand this is more of a strain on him at this time 02/23/2019 upon evaluation today patient actually appears to be making some progress. I am actually very pleased with the overall appearance of his wounds even compared to last evaluation. He seems to be doing quite well. He is taking care of his wife unfortunately she did have a compression fracture she has had the procedure for this but still she has a slow road to recovery. For that reason he still not gone to [facility] for a second opinion in this regard. Obviously the goal there was if there was anything that can be done from a skin graft standpoint or otherwise. 03/23/2019 upon evaluation today patient continues to have issues  with lower extremity ulcers. Since the beginning he has made progress but at the same time the wounds unfortunately just will not close. We have been trying to get the patient to [facility] to see a specialist there but unfortunately with the everything going on with his wife he has not been able to make that appointment time yet he states he may be able to in the next 1-2 months but is not really sure. 04/27/2019 on evaluation today patient appears to be doing a little bit more poorly. His last evaluation. He appears to have some erythema around the edges of the wound at this point. Fortunately there is no signs of active infection at this time which is good news. No fevers, chills, nausea, vomiting, or diarrhea. 06/08/2019 on evaluation today patient appears to be doing well with regard to his wounds. Overall they are actually measuring smaller compared to the last visit last month. We did treat him for Pseudomonas as well as methicillin-resistant Staph aureus. He was only on the treatment for MRSA however for 7 days as the Cipro was resistant and subsequently we had to place him on doxycycline. Nonetheless I am thinking that we may want to add the doxycycline and just do a month-long treatment considering the longstanding nature of his wounds and see if we get this under better control. 07/13/2019 upon evaluation today patient appears to be doing fairly well in regard to his bilateral lower extremities. There does not appear to be any signs of active infection which is good news. No fevers, chills, nausea, vomiting, or diarrhea. 08/10/2019 upon evaluation today patient appears to be doing about the same with regard to his wounds in general. Unfortunately he is not significantly better although is also not significantly worse which is great news there is no evidence of active infection at this time which is good news. He still dealing with a lot going on with his wife and  therefore is not really able to go see anyone at the specialty clinic at [facility] that we have previously set up still. 09/07/2019 on evaluation today patient appears to be doing well with regard to his wounds. In fact this is probably the best that have seen so far in quite a few months. Overall I am very pleased with where things stand at this time. No fevers, chills, nausea, vomiting, or diarrhea. 10/05/2019 upon evaluation today patient appears to be doing more poorly in regard to his legs at this point. He actually is showing some signs of infection. This has been something that we seem to be back-and-forth with as far as trying to keep these wounds from becoming  infected. He takes care of them very well in my opinion but nonetheless I am concerned in this regard. He tells me that his wife is still doing really about the same she is slowly getting better. Nonetheless he still spends the majority of his time helping to take care of her. 11/02/2019 upon evaluation today patient actually appears to be doing somewhat better in regard to his legs. I do believe that the compounded antibiotic treatment from [facility] has been beneficial for him. Overall I am extremely pleased with where things stand today. There is no signs of active infection at this time. The patient states he has much less drainage than he has in the past. 12/07/2019 upon evaluation today patient appears to be doing well at this time in regard to the overall appearance of his wound bed. Currently there is no signs of active infection at this time. 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 Especially when he stressed. Fortunately there is no signs of active infection locally or systemically at this point. 01/18/2020 on evaluation today patient appears to be doing well with  regard to his leg ulcers. He has been tolerating the dressing changes without complication. Fortunately there is no signs of infection and overall I feel like his legs are doing about the best they have done in quite some time. There does not appear to be any evidence of active infection which is great news and overall very pleased. 02/29/2020 upon evaluation today patient's wounds actually appear to be doing quite well currently. There is no sign of active infection at this time. No fevers, chills, nausea, vomiting, or diarrhea. 04/11/2025 on evaluation today patient appears to be doing excellent in regard to his wounds on the legs to be honest. He has made a lot of progress there does not appear to be any signs of active infection and overall I think that he is better than previous although still the wounds are quite significant obviously. In general I think that he is pleased with where things stand at this point. Still there is quite a bit of work to do here. 07/13/2020 upon evaluation today patient appears to be doing well with regard to his wound. Is been tolerating the dressing changes without complication on both legs. He just has the 2 main wounds on each leg at the ankle on the medial region of the right leg is completely healed. His wounds do seem to have gotten better during the time that he has been on bedrest as result of the pelvis fracture. Obviously does not the way that we wanted to see things improved but nonetheless he tells me that it is what it is. Fortunately he does seem to be doing better he tells me that her daughter from the [place] is actually down helping to take care of them out as he and his wife while he is recovering. 08/08/2020 upon evaluation today patient appears to be doing well currently in regard to his leg ulcers. He has been tolerating the dressing changes without complication. Fortunately there is no signs of active infection at this time. No fevers, chills, nausea,  vomiting, or diarrhea. 09/26/2020 upon evaluation today patient appears to be doing well with regard to his wound. He has been showing signs of good improvement which is great news and overall  I am extremely pleased with where things stand today. I do think that he does well with his dressing changes and overall has really improved significantly here. segment how that how she doing 11/14/2020 upon evaluation today patient appears to be doing well with regard to his leg ulcers. He has been tolerating the dressing changes without complication. Fortunately there does not appear to be any signs of active infection at this time. No fevers, chills, nausea, vomiting, or diarrhea. 12/26/2020 upon evaluation today patient actually appears to be making some good progress here in regard to his wounds. Fortunately I do not see any signs of active infection which is great news. No fevers, chills, nausea, vomiting, or diarrhea. Objective Constitutional Well-nourished and well-hydrated in no acute distress. Vitals Time Taken: 12:39 PM, Height: 71 in, Weight: 220 lbs, BMI: 30.7, Temperature: 97.8 F, Pulse: 77 bpm, Respiratory Rate: 18 breaths/min, Blood Pressure: 147/61 mmHg. Respiratory normal breathing without difficulty. Psychiatric this patient is able to make decisions and demonstrates good insight into disease process. Alert and Oriented x 3. pleasant and cooperative. General Notes: Upon inspection patient's wound bed again showed good epithelization and granulation and actually extremely pleased with where we stand today and I think the patient is making some good progress here. In fact I think everything appears to be smaller compared to last time I saw him. Integumentary (Hair, Skin) Wound #2 status is Open. Original cause of wound was Gradually Appeared. The date acquired was: 03/13/2017. The wound has been in treatment 169 weeks. The wound is located on the Left,Distal,Lateral Lower Leg. The wound  measures 11.4cm length x 7.8cm width x 0.1cm depth; 69.838cm^2 area and 6.984cm^3 volume. There is Fat Layer (Subcutaneous Tissue) exposed. There is no tunneling or undermining noted. There is a large amount of serosanguineous drainage noted. The wound margin is distinct with the outline attached to the wound base. There is large (67-100%) red granulation within the wound bed. There is a small (1-33%) amount of necrotic tissue within the wound bed including Adherent Slough. Wound #5 status is Open. Original cause of wound was Gradually Appeared. The date acquired was: 10/26/2017. The wound has been in treatment 165 weeks. The wound is located on the Right,Lateral Lower Leg. The wound measures 6cm length x 4.5cm width x 0.1cm depth; 21.206cm^2 area and 2.121cm^3 volume. There is Fat Layer (Subcutaneous Tissue) exposed. There is no tunneling or undermining noted. There is a medium amount of serosanguineous drainage noted. The wound margin is distinct with the outline attached to the wound base. There is large (67-100%) red granulation within the wound bed. There is no necrotic tissue within the wound bed. 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 - on days that dressing is changed Edema Control - Lymphedema / SCD / Other: Elevate legs to the level of the heart or above for 30 minutes daily and/or when sitting, a frequency of: - throughout the day Avoid standing for long periods of time. Exercise  regularly Moisturize legs daily. Compression stocking or Garment 20-30 mm/Hg pressure to: - Juxtalite to both legs daily WOUND #2: -  Lower Leg Wound Laterality: Left, Lateral, Distal Cleanser: Soap and Water 2 x Per Week/30 Days Discharge Instructions: May shower and wash wound with dial antibacterial soap and water prior to dressing change. Peri-Wound Care: Zinc Oxide Ointment 30g tube 2 x Per Week/30 Days Discharge Instructions: Apply Zinc Oxide to periwound with each dressing change as needed for maceration Peri-Wound Care: Sween Lotion (Moisturizing lotion) 2 x Per Week/30 Days Discharge Instructions: Apply moisturizing lotion as directed Prim Dressing: Triple antibiotic ointment 2 x Per Week/30 Days ary Discharge Instructions: generic to neosporin Secondary Dressing: ABD Pad, 5x9 (Generic) 2 x Per Week/30 Days Discharge Instructions: Apply over primary dressing as directed. Secondary Dressing: Xtrasorb Classic Super Absorbent Dressing, 6x9 (in/in) (Dispense As Written) 2 x Per Week/30 Days Discharge Instructions: Apply over primary dressing as directed. Secured With: Elastic Bandage 4 inch (ACE bandage) 2 x Per Week/30 Days Discharge Instructions: Secure with ACE bandage as directed. Secured With: [employer], 4.5x3.1 (in/yd) (Generic) 2 x Per Week/30 Days Discharge Instructions: Secure with Kerlix as directed. WOUND #5: - Lower Leg Wound Laterality: Right, Lateral Cleanser: Soap and Water 2 x Per Week/30 Days Discharge Instructions: May shower and wash wound with dial antibacterial soap and water prior to dressing change. Peri-Wound Care: Zinc Oxide Ointment 30g tube 2 x Per Week/30 Days Discharge Instructions: Apply Zinc Oxide to periwound with each dressing change as needed for maceration Peri-Wound Care: Sween Lotion (Moisturizing lotion) 2 x Per Week/30 Days Discharge Instructions: Apply moisturizing lotion as directed Prim Dressing: Triple antibiotic ointment 2 x Per  Week/30 Days ary Discharge Instructions: generic to neosporin Secondary Dressing: ABD Pad, 5x9 (Generic) 2 x Per Week/30 Days Discharge Instructions: Apply over primary dressing as directed. Secondary Dressing: Xtrasorb Classic Super Absorbent Dressing, 6x9 (in/in) (Dispense As Written) 2 x Per Week/30 Days Discharge Instructions: Apply over primary dressing as directed. Secured With: Elastic Bandage 4 inch (ACE bandage) 2 x Per Week/30 Days Discharge Instructions: Secure with ACE bandage as directed. Secured With: [employer], 4.5x3.1 (in/yd) (Generic) 2 x Per Week/30 Days Discharge Instructions: Secure with Kerlix as directed. 1. I would recommend currently that we go ahead and continue with the wound care measures as before. The patient is in agreement with that plan. This includes the use of the antibiotic ointment followed by the ABD pads and XtraSorb. Overall he has been doing great with this and I really see no major issues. I think that the best plan is good to be to continue along with the plan. 2. Also can recommend he continue to cover this with ABD pads, roll gauze, and then an Ace wrap to use in his juxta lites over top of that. We will see patient back for reevaluation in 6 weeks here in the clinic. If anything worsens or changes patient will contact our office for additional recommendations. Electronic Signature(s) Signed: 12/26/2020 1:18:17 PM By: Worthy Keeler PA-C Entered By: Worthy Keeler on 12/26/2020 13:18:17 -------------------------------------------------------------------------------- SuperBill Details Patient Name: Date of Service: Carlos White, GO RDO [name] 12/26/2020 Medical Record Number: [id number] Patient Account Number: [id number] Date of Birth/Sex: Treating RN: December 27, 1940 (80 y.o. Marcheta Grammes Primary Care Provider: [name] Other Clinician: Referring Provider: Treating Provider/Extender: [name] in Treatment:  169 Diagnosis Coding ICD-10 Codes Code Description I87.2 Venous insufficiency (chronic) (peripheral) Q81.8 Other epidermolysis bullosa L97.322 Non-pressure chronic ulcer of left ankle with fat layer exposed L97.822 Non-pressure chronic ulcer of other part of left lower leg  with fat layer exposed L97.812 Non-pressure chronic ulcer of other part of right lower leg with fat layer exposed [place] (primary) hypertension Facility Procedures CPT4 Code: [id number] Description: 99213 - WOUND CARE VISIT-LEV 3 EST PT Modifier: Quantity: 1 Physician Procedures : CPT4 Code Description Modifier [id number] - WC PHYS LEVEL 3 - EST PT ICD-10 Diagnosis Description I87.2 Venous insufficiency (chronic) (peripheral) Q81.8 Other epidermolysis bullosa L97.322 Non-pressure chronic ulcer of left ankle with fat layer  exposed L97.822 Non-pressure chronic ulcer of other part of left lower leg with fat layer exposed Quantity: 1 Electronic Signature(s) Signed: 12/26/2020 1:18:55 PM By: Worthy Keeler PA-C Entered By: Worthy Keeler on 12/26/2020 13:18:55

## 2020-12-26 NOTE — Progress Notes (Signed)
Carlos White, Carlos White ([id number]) Visit Report for 12/26/2020 Arrival Information Details Patient Name: Date of Service: Carlos White 12/26/2020 12:30 PM Medical Record Number: [id number] Patient Account Number: [id number] Date of Birth/Sex: Treating RN: [birth year] (80 y.o. Marcheta White Primary Care Dayshawn Irizarry: [name] Other Clinician: Referring Chenelle Benning: Treating Danton Palmateer/Extender: [name] in Treatment: 48 Visit Information History Since Last Visit Added or deleted any medications: No Patient Arrived: Ambulatory Any new allergies or adverse reactions: No Arrival Time: 12:37 Had a fall or experienced change in No Transfer Assistance: None activities of daily living that may affect Patient Identification Verified: Yes risk of falls: Secondary Verification Process Completed: Yes Signs or symptoms of abuse/neglect since last visito No Patient Requires Transmission-Based Precautions: No Hospitalized since last visit: No Patient Has Alerts: Yes Implantable device outside of the clinic excluding No Patient Alerts: R ABI= 1.23, TBI = .86 cellular tissue based products placed in the center L ABI= 1.28, TBI=.81 since last visit: Has Dressing in Place as Prescribed: Yes Pain Present Now: No Electronic Signature(s) Signed: 12/26/2020 5:19:40 PM By: Lorrin Jackson Entered By: Lorrin Jackson on 12/26/2020 12:37:45 -------------------------------------------------------------------------------- Clinic Level of Care Assessment Details Patient Name: Date of Service: Carlos White 12/26/2020 12:30 PM Medical Record Number: [id number] Patient Account Number: [id number] Date of Birth/Sex: Treating RN: [birth year] (80 y.o. Marcheta White Primary Care Laterrance Nauta: [name] Other Clinician: Referring Cheryll Keisler: Treating Harlynn Kimbell/Extender: [name] in Treatment: [facility] Level of Care Assessment Items TOOL 4 Quantity  Score X- 1 0 Use when only an EandM is performed on FOLLOW-UP visit ASSESSMENTS - Nursing Assessment / Reassessment X- 1 10 Reassessment of Co-morbidities (includes updates in patient status) X- 1 5 Reassessment of Adherence to Treatment Plan ASSESSMENTS - Wound and Skin A ssessment / Reassessment []  - 0 Simple Wound Assessment / Reassessment - one wound X- 2 5 Complex Wound Assessment / Reassessment - multiple wounds []  - 0 Dermatologic / Skin Assessment (not related to wound area) ASSESSMENTS - Focused Assessment []  - 0 Circumferential Edema Measurements - multi extremities []  - 0 Nutritional Assessment / Counseling / Intervention []  - 0 Lower Extremity Assessment (monofilament, tuning fork, pulses) []  - 0 Peripheral Arterial Disease Assessment (using hand held doppler) ASSESSMENTS - Ostomy and/or Continence Assessment and Care []  - 0 Incontinence Assessment and Management []  - 0 Ostomy Care Assessment and Management (repouching, etc.) PROCESS - Coordination of Care []  - 0 Simple Patient / Family Education for ongoing care X- 1 20 Complex (extensive) Patient / Family Education for ongoing care []  - 0 Staff obtains Programmer, systems, Records, T Results / Process Orders est []  - 0 Staff telephones HHA, Nursing Homes / Clarify orders / etc []  - 0 Routine Transfer to another Facility (non-emergent condition) []  - 0 Routine Hospital Admission (non-emergent condition) []  - 0 New Admissions / Biomedical engineer / Ordering NPWT Apligraf, etc. , []  - 0 Emergency Hospital Admission (emergent condition) []  - 0 Simple Discharge Coordination []  - 0 Complex (extensive) Discharge Coordination PROCESS - Special Needs []  - 0 Pediatric / Minor Patient Management []  - 0 Isolation Patient Management []  - 0 Hearing / Language / Visual special needs []  - 0 Assessment of Community assistance (transportation, D/C planning, etc.) []  - 0 Additional assistance / Altered  mentation []  - 0 Support Surface(s) Assessment (bed, cushion, seat, etc.) INTERVENTIONS - Wound Cleansing / Measurement []  - 0 Simple Wound Cleansing - one  wound X- 2 5 Complex Wound Cleansing - multiple wounds X- 1 5 Wound Imaging (photographs - any number of wounds) []  - 0 Wound Tracing (instead of photographs) []  - 0 Simple Wound Measurement - one wound X- 2 5 Complex Wound Measurement - multiple wounds INTERVENTIONS - Wound Dressings []  - 0 Small Wound Dressing one or multiple wounds []  - 0 Medium Wound Dressing one or multiple wounds X- 2 20 Large Wound Dressing one or multiple wounds []  - 0 Application of Medications - topical []  - 0 Application of Medications - injection INTERVENTIONS - Miscellaneous []  - 0 External ear exam []  - 0 Specimen Collection (cultures, biopsies, blood, body fluids, etc.) []  - 0 Specimen(s) / Culture(s) sent or taken to Lab for analysis []  - 0 Patient Transfer (multiple staff / Civil Service fast streamer / Similar devices) []  - 0 Simple Staple / Suture removal (25 or less) []  - 0 Complex Staple / Suture removal (26 or more) []  - 0 Hypo / Hyperglycemic Management (close monitor of Blood Glucose) []  - 0 Ankle / Brachial Index (ABI) - do not check if billed separately X- 1 5 Vital Signs Has the patient been seen at the hospital within the last three years: Yes Total Score: 115 Level Of Care: New/Established - Level 3 Electronic Signature(s) Signed: 12/26/2020 5:19:40 PM By: Lorrin Jackson Entered By: Lorrin Jackson on 12/26/2020 13:07:38 -------------------------------------------------------------------------------- Encounter Discharge Information Details Patient Name: Date of Service: Carlos White, Carlos RDO N H. 12/26/2020 12:30 PM Medical Record Number: [id number] Patient Account Number: [id number] Date of Birth/Sex: Treating RN: [birth year] (80 y.o. Marcheta White Primary Care Antonieta Slaven: [name] Other Clinician: Referring Deangelo Berns: Treating  Shaheim Mahar/Extender: [name] in Treatment: [phone] Encounter Discharge Information Items Discharge Condition: Stable Ambulatory Status: Ambulatory Discharge Destination: Home Transportation: Private Auto Schedule Follow-up Appointment: Yes Clinical Summary of Care: Provided on 12/26/2020 Form Type Recipient Paper Patient Patient Electronic Signature(s) Signed: 12/26/2020 5:19:40 PM By: Lorrin Jackson Entered By: Lorrin Jackson on 12/26/2020 13:25:38 -------------------------------------------------------------------------------- Lower Extremity Assessment Details Patient Name: Date of Service: Carlos Dull RDO [name] 12/26/2020 12:30 PM Medical Record Number: [id number] Patient Account Number: [id number] Date of Birth/Sex: Treating RN: Carlos White, Carlos White Primary Care Mikita Lesmeister: [name] Other Clinician: Referring Chelsy Parrales: Treating Britany Callicott/Extender: [name] in Treatment: 169 Edema Assessment Assessed: Shirlyn Goltz: Yes] Patrice Paradise: Yes] Edema: [Left: Yes] [Right: Yes] Calf Left: Right: Point of Measurement: 35.5 cm From Medial Instep 35 cm 35.8 cm Ankle Left: Right: Point of Measurement: 11 cm From Medial Instep 25.5 cm 23.8 cm Vascular Assessment Pulses: Dorsalis Pedis Palpable: [Left:Yes] [Right:Yes] Electronic Signature(s) Signed: 12/26/2020 5:19:40 PM By: Lorrin Jackson Entered By: Lorrin Jackson on 12/26/2020 12:47:37 -------------------------------------------------------------------------------- Multi-Disciplinary Care Plan Details Patient Name: Date of Service: Carlos White, Carlos RDO N H. 12/26/2020 12:30 PM Medical Record Number: [id number] Patient Account Number: [id number] Date of Birth/Sex: Treating RN: [birth year] (80 y.o. Marcheta White Primary Care Isley Weisheit: [name] Other Clinician: Referring Laretta Pyatt: Treating Mayar Whittier/Extender: [name] in Treatment:  [place] reviewed with physician Active Inactive Venous Leg Ulcer Nursing Diagnoses: Knowledge deficit related to disease process and management Potential for venous Insuffiency (use before diagnosis confirmed) Goals: Patient will maintain optimal edema control Date Initiated: 09/30/2017 Target Resolution Date: 01/04/2021 Goal Status: Active Patient/caregiver will verbalize understanding of disease process and disease management Date Initiated: 09/30/2017 Date Inactivated: 11/04/2017 Target Resolution Date: 10/30/2017 Goal Status: Met Interventions: Assess peripheral  edema status every visit. Provide education on venous insufficiency Notes: Wound/Skin Impairment Nursing Diagnoses: Impaired tissue integrity Knowledge deficit related to ulceration/compromised skin integrity Goals: Patient/caregiver will verbalize understanding of skin care regimen Date Initiated: 09/30/2017 Target Resolution Date: 01/04/2021 Goal Status: Active Ulcer/skin breakdown will have a volume reduction of 30% by week 4 Date Initiated: 09/30/2017 Date Inactivated: 11/04/2017 Target Resolution Date: 10/30/2017 Goal Status: Met Interventions: Assess patient/caregiver ability to obtain necessary supplies Assess patient/caregiver ability to perform ulcer/skin care regimen upon admission and as needed Assess ulceration(s) every visit Provide education on ulcer and skin care Notes: Electronic Signature(s) Signed: 12/26/2020 5:19:40 PM By: Lorrin Jackson Entered By: Lorrin Jackson on 12/26/2020 12:38:22 -------------------------------------------------------------------------------- Pain Assessment Details Patient Name: Date of Service: Carlos White, Carlos RDO N H. 12/26/2020 12:30 PM Medical Record Number: [id number] Patient Account Number: [id number] Date of Birth/Sex: Treating RN: [birth year] (80 y.o. Marcheta White Primary Care Wallace Gappa: [name] Other Clinician: Referring  Linzee Depaul: Treating Carliss Quast/Extender: [name] in Treatment: [phone] Active Problems Location of Pain Severity and Description of Pain Patient Has Paino No Site Locations Pain Management and Medication Current Pain Management: Electronic Signature(s) Signed: 12/26/2020 5:19:40 PM By: Lorrin Jackson Entered By: Lorrin Jackson on 12/26/2020 12:37:56 -------------------------------------------------------------------------------- Patient/Caregiver Education Details Patient Name: Date of Service: Carlos White 12/14/2022andnbsp12:30 PM Medical Record Number: [id number] Patient Account Number: [id number] Date of Birth/Gender: Treating RN: [birth year] (80 y.o. Marcheta White Primary Care Physician: [name] Other Clinician: Referring Physician: Treating Physician/Extender: [name] in Treatment: 29 Education Assessment Education Provided To: Patient Education Topics Provided Venous: Methods: Explain/Verbal, Printed Responses: State content correctly Wound/Skin Impairment: Methods: Explain/Verbal, Printed Responses: State content correctly Electronic Signature(s) Signed: 12/26/2020 5:19:40 PM By: Lorrin Jackson Entered By: Lorrin Jackson on 12/26/2020 12:38:42 -------------------------------------------------------------------------------- Wound Assessment Details Patient Name: Date of Service: Carlos White, Carlos RDO N H. 12/26/2020 12:30 PM Medical Record Number: [id number] Patient Account Number: [id number] Date of Birth/Sex: Treating RN: [birth year] (80 y.o. Marcheta White Primary Care Takumi Din: [name] Other Clinician: Referring Kierah Goatley: Treating Jenalee Trevizo/Extender: [name] in Treatment: 169 Wound Status Wound Number: 2 Primary Venous Leg Ulcer Etiology: Wound Location: Left, Distal, Lateral Lower Leg Wound Status: Open Wounding Event: Gradually Appeared Comorbid Cataracts,  Chronic Obstructive Pulmonary Disease (COPD), Date Acquired: 03/13/2017 History: Hypertension Weeks Of Treatment: 169 Clustered Wound: Yes Wound Measurements Length: (cm) 11. Width: (cm) 7.8 Depth: (cm) 0.1 Clustered Quantity: 4 Area: (cm) 69 Volume: (cm) 6. 4 % Reduction in Area: 9.9% % Reduction in Volume: 82% Epithelialization: Medium (34-66%) Tunneling: No .838 Undermining: No 984 Wound Description Classification: Full Thickness Without Exposed Support Structures Wound Margin: Distinct, outline attached Exudate Amount: Large Exudate Type: Serosanguineous Exudate Color: red, brown Foul Odor After Cleansing: No Slough/Fibrino Yes Wound Bed Granulation Amount: Large (67-100%) Exposed Structure Granulation Quality: Red Fascia Exposed: No Necrotic Amount: Small (1-33%) Fat Layer (Subcutaneous Tissue) Exposed: Yes Necrotic Quality: Adherent Slough Tendon Exposed: No Muscle Exposed: No Joint Exposed: No Bone Exposed: No Treatment Notes Wound #2 (Lower Leg) Wound Laterality: Left, Lateral, Distal Cleanser Soap and Water Discharge Instruction: May shower and wash wound with dial antibacterial soap and water prior to dressing change. Peri-Wound Care Zinc Oxide Ointment 30g tube Discharge Instruction: Apply Zinc Oxide to periwound with each dressing change as needed for maceration Sween Lotion (Moisturizing lotion) Discharge Instruction: Apply moisturizing lotion as directed Topical Primary Dressing Triple antibiotic ointment Discharge Instruction: generic to neosporin  Secondary Dressing ABD Pad, 5x9 Discharge Instruction: Apply over primary dressing as directed. Xtrasorb Classic Super [employer], 6x9 (in/in) Discharge Instruction: Apply over primary dressing as directed. Secured With Elastic Bandage 4 inch (ACE bandage) Discharge Instruction: Secure with ACE bandage as directed. Kerlix Roll Sterile, 4.5x3.1 (in/yd) Discharge Instruction: Secure with  Kerlix as directed. Compression Wrap Compression Stockings Add-Ons Electronic Signature(s) Signed: 12/26/2020 5:19:40 PM By: Lorrin Jackson Entered By: Lorrin Jackson on 12/26/2020 12:48:45 -------------------------------------------------------------------------------- Wound Assessment Details Patient Name: Date of Service: Carlos White, Carlos RDO N H. 12/26/2020 12:30 PM Medical Record Number: [id number] Patient Account Number: [id number] Date of Birth/Sex: Treating RN: [birth year] (80 y.o. Marcheta White Primary Care Dariella Gillihan: [name] Other Clinician: Referring Altha Sweitzer: Treating Bentzion Dauria/Extender: [name] in Treatment: 169 Wound Status Wound Number: 5 Primary Vasculopathy Etiology: Wound Location: Right, Lateral Lower Leg Wound Status: Open Wounding Event: Gradually Appeared Comorbid Cataracts, Chronic Obstructive Pulmonary Disease (COPD), Date Acquired: 10/26/2017 History: Hypertension Weeks Of Treatment: 165 Clustered Wound: Yes Wound Measurements Length: (cm) 6 Width: (cm) 4.5 Depth: (cm) 0.1 Area: (cm) 21.206 Volume: (cm) 2.121 % Reduction in Area: 60.9% % Reduction in Volume: 60.9% Epithelialization: Medium (34-66%) Tunneling: No Undermining: No Wound Description Classification: Full Thickness Without Exposed Support Structures Wound Margin: Distinct, outline attached Exudate Amount: Medium Exudate Type: Serosanguineous Exudate Color: red, brown Foul Odor After Cleansing: No Slough/Fibrino No Wound Bed Granulation Amount: Large (67-100%) Exposed Structure Granulation Quality: Red Fascia Exposed: No Necrotic Amount: None Present (0%) Fat Layer (Subcutaneous Tissue) Exposed: Yes Tendon Exposed: No Muscle Exposed: No Joint Exposed: No Bone Exposed: No Treatment Notes Wound #5 (Lower Leg) Wound Laterality: Right, Lateral Cleanser Soap and Water Discharge Instruction: May shower and wash wound with dial antibacterial soap  and water prior to dressing change. Peri-Wound Care Zinc Oxide Ointment 30g tube Discharge Instruction: Apply Zinc Oxide to periwound with each dressing change as needed for maceration Sween Lotion (Moisturizing lotion) Discharge Instruction: Apply moisturizing lotion as directed Topical Primary Dressing Triple antibiotic ointment Discharge Instruction: generic to neosporin Secondary Dressing ABD Pad, 5x9 Discharge Instruction: Apply over primary dressing as directed. Xtrasorb Classic Super [employer], 6x9 (in/in) Discharge Instruction: Apply over primary dressing as directed. Secured With Elastic Bandage 4 inch (ACE bandage) Discharge Instruction: Secure with ACE bandage as directed. Kerlix Roll Sterile, 4.5x3.1 (in/yd) Discharge Instruction: Secure with Kerlix as directed. Compression Wrap Compression Stockings Add-Ons Electronic Signature(s) Signed: 12/26/2020 5:19:40 PM By: Lorrin Jackson Entered By: Lorrin Jackson on 12/26/2020 12:50:00 -------------------------------------------------------------------------------- Vitals Details Patient Name: Date of Service: Carlos White, Carlos RDO N H. 12/26/2020 12:30 PM Medical Record Number: [id number] Patient Account Number: [id number] Date of Birth/Sex: Treating RN: [birth year] (80 y.o. Marcheta White Primary Care Griffey Nicasio: [name] Other Clinician: Referring Robertha Staples: Treating Dakota Vanwart/Extender: [name] in Treatment: 169 Vital Signs Time Taken: 12:39 Temperature (F): 97.8 Height (in): 71 Pulse (bpm): 77 Weight (lbs): 220 Respiratory Rate (breaths/min): 18 Body Mass Index (BMI): 30.7 Blood Pressure (mmHg): 147/61 Reference Range: 80 - 120 mg / dl Electronic Signature(s) Signed: 12/26/2020 5:19:40 PM By: Lorrin Jackson Entered By: Lorrin Jackson on 12/26/2020 12:40:40

## 2020-12-31 ENCOUNTER — Other Ambulatory Visit: Payer: Self-pay | Admitting: Internal Medicine

## 2021-02-06 ENCOUNTER — Other Ambulatory Visit: Payer: Self-pay

## 2021-02-06 ENCOUNTER — Encounter (HOSPITAL_BASED_OUTPATIENT_CLINIC_OR_DEPARTMENT_OTHER): Payer: Medicare PPO | Attending: Physician Assistant | Admitting: Physician Assistant

## 2021-02-06 DIAGNOSIS — L97322 Non-pressure chronic ulcer of left ankle with fat layer exposed: Secondary | ICD-10-CM | POA: Insufficient documentation

## 2021-02-06 DIAGNOSIS — L97812 Non-pressure chronic ulcer of other part of right lower leg with fat layer exposed: Secondary | ICD-10-CM | POA: Insufficient documentation

## 2021-02-06 DIAGNOSIS — I872 Venous insufficiency (chronic) (peripheral): Secondary | ICD-10-CM | POA: Insufficient documentation

## 2021-02-06 DIAGNOSIS — L97822 Non-pressure chronic ulcer of other part of left lower leg with fat layer exposed: Secondary | ICD-10-CM | POA: Diagnosis not present

## 2021-02-06 DIAGNOSIS — I10 Essential (primary) hypertension: Secondary | ICD-10-CM | POA: Insufficient documentation

## 2021-02-06 DIAGNOSIS — Q818 Other epidermolysis bullosa: Secondary | ICD-10-CM | POA: Insufficient documentation

## 2021-02-06 NOTE — Progress Notes (Signed)
EJAY, LASHLEY ([id number]) Visit Report for 02/06/2021 Arrival Information Details Patient Name: Date of Service: Carlos White 02/06/2021 12:30 PM Medical Record Number: [id number] Patient Account Number: [id number] Date of Birth/Sex: Treating RN: 04/21/1940 (81 y.o. [name], [name] Primary Care Ryken Paschal: [name] Other Clinician: Referring Lashawnna Lambrecht: Treating Finnley Larusso/Extender: [name] in Treatment: 12 Visit Information History Since Last Visit Added or deleted any medications: No Patient Arrived: Ambulatory Any new allergies or adverse reactions: No Arrival Time: 12:40 Had a fall or experienced change in No Accompanied By: self activities of daily living that may affect Transfer Assistance: None risk of falls: Patient Identification Verified: Yes Signs or symptoms of abuse/neglect since last visito No Secondary Verification Process Completed: Yes Hospitalized since last visit: No Patient Requires Transmission-Based Precautions: No Implantable device outside of the clinic excluding No Patient Has Alerts: Yes cellular tissue based products placed in the center Patient Alerts: R ABI= 1.23, TBI = .86 since last visit: L ABI= 1.28, TBI=.81 Has Dressing in Place as Prescribed: Yes Has Compression in Place as Prescribed: Yes Pain Present Now: No Electronic Signature(s) Signed: 02/06/2021 4:52:26 PM By: [name] RN, BSN Entered By: [name] on 02/06/2021 12:40:41 -------------------------------------------------------------------------------- Clinic Level of Care Assessment Details Patient Name: Date of Service: Carlos White 02/06/2021 12:30 PM Medical Record Number: [id number] Patient Account Number: [id number] Date of Birth/Sex: Treating RN: January 25, 1940 (81 y.o. [name] Primary Care Laury Huizar: [name] Other Clinician: Referring Aman Batley: Treating Melisha Eggleton/Extender: [name] in Treatment:  [place] Assessment Items TOOL 4 Quantity Score X- 1 0 Use when only an EandM is performed on FOLLOW-UP visit ASSESSMENTS - Nursing Assessment / Reassessment X- 1 10 Reassessment of Co-morbidities (includes updates in patient status) X- 1 5 Reassessment of Adherence to Treatment Plan ASSESSMENTS - Wound and Skin A ssessment / Reassessment _0  - 0 Simple Wound Assessment / Reassessment - one wound X- 2 5 Complex Wound Assessment / Reassessment - multiple wounds X- 1 10 Dermatologic / Skin Assessment (not related to wound area) ASSESSMENTS - Focused Assessment X- 2 5 Circumferential Edema Measurements - multi extremities X- 1 10 Nutritional Assessment / Counseling / Intervention _1  - 0 Lower Extremity Assessment (monofilament, tuning fork, pulses) _2  - 0 Peripheral Arterial Disease Assessment (using hand held doppler) ASSESSMENTS - Ostomy and/or Continence Assessment and Care _3  - 0 Incontinence Assessment and Management _4  - 0 Ostomy Care Assessment and Management (repouching, etc.) PROCESS - Coordination of Care _5  - 0 Simple Patient / Family Education for ongoing care X- 1 20 Complex (extensive) Patient / Family Education for ongoing care X- 1 10 Staff obtains Programmer, systems, Records, T Results / Process Orders est _6  - 0 Staff telephones HHA, Nursing Homes / Clarify orders / etc _7  - 0 Routine Transfer to another Facility (non-emergent condition) _8  - 0 Routine Hospital Admission (non-emergent condition) _9  - 0 New Admissions / Biomedical engineer / Ordering NPWT Apligraf, etc. , _10  - 0 Emergency Hospital Admission (emergent condition) _11  - 0 Simple Discharge Coordination X- 1 15 Complex (extensive) Discharge Coordination PROCESS - Special Needs _12  - 0 Pediatric / Minor Patient Management _13  - 0 Isolation Patient Management _14  - 0 Hearing / Language / Visual special needs _15  - 0 Assessment of Community assistance (transportation, D/C  planning, etc.) _16  - 0 Additional assistance / Altered mentation _17  - 0 Support Surface(s) Assessment (bed, cushion, seat, etc.) INTERVENTIONS -  Wound Cleansing / Measurement _0  - 0 Simple Wound Cleansing - one wound X- 2 5 Complex Wound Cleansing - multiple wounds X- 1 5 Wound Imaging (photographs - any number of wounds) _1  - 0 Wound Tracing (instead of photographs) _2  - 0 Simple Wound Measurement - one wound X- 2 5 Complex Wound Measurement - multiple wounds INTERVENTIONS - Wound Dressings _3  - 0 Small Wound Dressing one or multiple wounds X- 2 15 Medium Wound Dressing one or multiple wounds _4  - 0 Large Wound Dressing one or multiple wounds <ZOXWRUEAVWUJWJXB>_1<\/YNWGNFAOZHYQMVHQ>_4  - 0 Application of Medications - topical <ONGEXBMWUXLKGMWN>_0<\/UVOZDGUYQIHKVQQV>_9  - 0 Application of Medications - injection INTERVENTIONS - Miscellaneous _7  - 0 External ear exam _8  - 0 Specimen Collection (cultures, biopsies, blood, body fluids, etc.) _9  - 0 Specimen(s) / Culture(s) sent or taken to Lab for analysis _10  - 0 Patient Transfer (multiple staff / Civil Service fast streamer / Similar devices) _11  - 0 Simple Staple / Suture removal (25 or less) _12  - 0 Complex Staple / Suture removal (26 or more) _13  - 0 Hypo / Hyperglycemic Management (close monitor of Blood Glucose) _14  - 0 Ankle / Brachial Index (ABI) - do not check if billed separately X- 1 5 Vital Signs Has the patient been seen at the hospital within the last three years: Yes Total Score: 160 Level Of Care: New/Established - Level 5 Electronic Signature(s) Signed: 02/06/2021 4:52:26 PM By: [name] RN, BSN Entered By: [name] on 02/06/2021 13:06:51 -------------------------------------------------------------------------------- Encounter Discharge Information Details Patient Name: Date of Service: Carlos White, Carlos White. 02/06/2021 12:30 PM Medical Record Number: [id number] Patient Account Number: [id number] Date of Birth/Sex: Treating RN: 03-03-1940 (81 y.o. [name] Primary Care Kemi Gell:  [name] Other Clinician: Referring Yitta Gongaware: Treating Alle Difabio/Extender: [name] in Treatment: [phone] Encounter Discharge Information Items Discharge Condition: Stable Ambulatory Status: Ambulatory Discharge Destination: Home Transportation: Other Accompanied By: self Schedule Follow-up Appointment: Yes Clinical Summary of Care: Electronic Signature(s) Signed: 02/06/2021 4:52:26 PM By: [name] RN, BSN Entered By: [name] on 02/06/2021 13:08:24 -------------------------------------------------------------------------------- Lower Extremity Assessment Details Patient Name: Date of Service: Carlos White. 02/06/2021 12:30 PM Medical Record Number: [id number] Patient Account Number: [id number] Date of Birth/Sex: Treating RN: [birth year] (81 y.o. [name] Primary Care Maressa Apollo: [name] Other Clinician: Referring Malika Demario: Treating Ocean Schildt/Extender: [name] in Treatment: 175 Edema Assessment Assessed: Shirlyn Goltz: Yes] Patrice Paradise: Yes] Edema: [Left: Yes] [Right: Yes] Calf Left: Right: Point of Measurement: 35.5 cm From Medial Instep 37 cm 34 cm Ankle Left: Right: Point of Measurement: 11 cm From Medial Instep 25 cm 23 cm Vascular Assessment Pulses: Dorsalis Pedis Palpable: [Left:Yes] [Right:Yes] Electronic Signature(s) Signed: 02/06/2021 4:52:26 PM By: [name] RN, BSN Entered By: [name] on 02/06/2021 12:46:59 -------------------------------------------------------------------------------- [place] Details Patient Name: Date of Service: Carlos White, Carlos White. 02/06/2021 12:30 PM Medical Record Number: [id number] Patient Account Number: [id number] Date of Birth/Sex: Treating RN: August 06, 1940 (81 y.o. [name] Primary Care Markella Dao: [name] Other Clinician: Referring Felicite Zeimet: Treating Shruthi Northrup/Extender: [name] in Treatment:  [place] reviewed with physician Active Inactive Venous Leg Ulcer Nursing Diagnoses: Knowledge deficit related to disease process and management Potential for venous Insuffiency (use before diagnosis confirmed) Goals: Patient will maintain optimal edema control Date Initiated: 09/30/2017 Target Resolution Date: 03/15/2021 Goal Status: Active Patient/caregiver will verbalize understanding of disease process and disease management Date Initiated: 09/30/2017 Date Inactivated:  11/04/2017 Target Resolution Date: 10/30/2017 Goal Status: Met Interventions: Assess peripheral edema status every visit. Provide education on venous insufficiency Notes: Wound/Skin Impairment Nursing Diagnoses: Impaired tissue integrity Knowledge deficit related to ulceration/compromised skin integrity Goals: Patient/caregiver will verbalize understanding of skin care regimen Date Initiated: 09/30/2017 Target Resolution Date: 03/15/2021 Goal Status: Active Ulcer/skin breakdown will have a volume reduction of 30% by week 4 Date Initiated: 09/30/2017 Date Inactivated: 11/04/2017 Target Resolution Date: 10/30/2017 Goal Status: Met Interventions: Assess patient/caregiver ability to obtain necessary supplies Assess patient/caregiver ability to perform ulcer/skin care regimen upon admission and as needed Assess ulceration(s) every visit Provide education on ulcer and skin care Notes: Electronic Signature(s) Signed: 02/06/2021 4:52:26 PM By: [name] RN, BSN Entered By: [name] on 02/06/2021 12:49:32 -------------------------------------------------------------------------------- Pain Assessment Details Patient Name: Date of Service: Carlos White. 02/06/2021 12:30 PM Medical Record Number: [id number] Patient Account Number: [id number] Date of Birth/Sex: Treating RN: 18-Oct-1940 (81 y.o. [name] Primary Care Olney Monier: [name] Other Clinician: Referring  Darik Massing: Treating Marvina Danner/Extender: [name] in Treatment: [phone] Active Problems Location of Pain Severity and Description of Pain Patient Has Paino No Site Locations Rate the pain. Current Pain Level: 0 Pain Management and Medication Current Pain Management: Medication: No Cold Application: No Rest: No Massage: No Activity: No T.E.N.S.: No Heat Application: No Leg drop or elevation: No Is the Current Pain Management Adequate: Adequate How does your wound impact your activities of daily livingo Sleep: No Bathing: No Appetite: No Relationship With Others: No Bladder Continence: No Emotions: No Bowel Continence: No Work: No Toileting: No Drive: No Dressing: No Hobbies: No Engineer, maintenance) Signed: 02/06/2021 4:52:26 PM By: [name] RN, BSN Entered By: [name] on 02/06/2021 12:40:54 -------------------------------------------------------------------------------- Patient/Caregiver Education Details Patient Name: Date of Service: Carlos White 1/25/2023andnbsp12:30 PM Medical Record Number: [id number] Patient Account Number: [id number] Date of Birth/Gender: Treating RN: 05-01-1940 (81 y.o. [name] Primary Care Physician: [name] Other Clinician: Referring Physician: Treating Physician/Extender: [name] in Treatment: [phone] Education Assessment Education Provided To: Patient Education Topics Provided Wound/Skin Impairment: Handouts: Skin Care Do's and Dont's Methods: Explain/Verbal Responses: Reinforcements needed Electronic Signature(s) Signed: 02/06/2021 4:52:26 PM By: [name] RN, BSN Entered By: [name] on 02/06/2021 12:49:49 -------------------------------------------------------------------------------- Wound Assessment Details Patient Name: Date of Service: Carlos White. 02/06/2021 12:30 PM Medical Record Number: [id number] Patient Account Number:  [id number] Date of Birth/Sex: Treating RN: December 13, 1940 (81 y.o. [name] Primary Care Florrie Ramires: [name] Other Clinician: Referring Infant Zink: Treating Cailah Reach/Extender: [name] in Treatment: 175 Wound Status Wound Number: 2 Primary Venous Leg Ulcer Etiology: Wound Location: Left, Distal, Lateral Lower Leg Wound Status: Open Wounding Event: Gradually Appeared Comorbid Cataracts, Chronic Obstructive Pulmonary Disease (COPD), Date Acquired: 03/13/2017 History: Hypertension Weeks Of Treatment: 175 Clustered Wound: Yes Photos Wound Measurements Length: (cm) 14. Width: (cm) 8.5 Depth: (cm) 0.2 Clustered Quantity: 4 Area: (cm) 95 Volume: (cm) 19 3 % Reduction in Area: -23.2% % Reduction in Volume: 50.7% Epithelialization: Medium (34-66%) Tunneling: No .465 Undermining: No .093 Wound Description Classification: Full Thickness Without Exposed Support Structures Wound Margin: Distinct, outline attached Exudate Amount: Large Exudate Type: Serosanguineous Exudate Color: red, brown Foul Odor After Cleansing: No Slough/Fibrino Yes Wound Bed Granulation Amount: Large (67-100%) Exposed Structure Granulation Quality: Red Fascia Exposed: No Necrotic Amount: Small (1-33%) Fat Layer (Subcutaneous Tissue) Exposed: Yes Necrotic Quality: Adherent Slough Tendon  Exposed: No Muscle Exposed: No Joint Exposed: No Bone Exposed: No Treatment Notes Wound #2 (Lower Leg) Wound Laterality: Left, Lateral, Distal Cleanser Soap and Water Discharge Instruction: May shower and wash wound with dial antibacterial soap and water prior to dressing change. Peri-Wound Care Zinc Oxide Ointment 30g tube Discharge Instruction: Apply Zinc Oxide to periwound with each dressing change as needed for maceration Sween Lotion (Moisturizing lotion) Discharge Instruction: Apply moisturizing lotion as directed Topical Primary Dressing FIBRACOL Plus Dressing, 2x2 in  (collagen) Discharge Instruction: Moisten collagen with saline or hydrogel Secondary Dressing ABD Pad, 5x9 Discharge Instruction: Apply over primary dressing as directed. Xtrasorb Classic Super [employer], 6x9 (in/in) Discharge Instruction: Apply over primary dressing as directed. Secured With Elastic Bandage 4 inch (ACE bandage) Discharge Instruction: Secure with ACE bandage as directed. Kerlix Roll Sterile, 4.5x3.1 (in/yd) Discharge Instruction: Secure with Kerlix as directed. Compression Wrap Compression Stockings Add-Ons Electronic Signature(s) Signed: 02/06/2021 4:52:26 PM By: [name] RN, BSN Entered By: [name] on 02/06/2021 12:49:14 -------------------------------------------------------------------------------- Wound Assessment Details Patient Name: Date of Service: Carlos White 02/06/2021 12:30 PM Medical Record Number: [id number] Patient Account Number: [id number] Date of Birth/Sex: Treating RN: June 26, 1940 (81 y.o. [name] Primary Care Deshan Hemmelgarn: [name] Other Clinician: Referring Sulayman Manning: Treating Mazin Emma/Extender: [name] in Treatment: 175 Wound Status Wound Number: 5 Primary Vasculopathy Etiology: Wound Location: Right, Lateral, Anterior Lower Leg Wound Status: Open Wounding Event: Gradually Appeared Comorbid Cataracts, Chronic Obstructive Pulmonary Disease (COPD), Date Acquired: 10/26/2017 History: Hypertension Weeks Of Treatment: 171 Clustered Wound: Yes Photos Wound Measurements Length: (cm) 5.8 Width: (cm) 5 Depth: (cm) 0.1 Area: (cm) 22.777 Volume: (cm) 2.278 % Reduction in Area: 58% % Reduction in Volume: 58% Epithelialization: Medium (34-66%) Tunneling: No Undermining: No Wound Description Classification: Full Thickness Without Exposed Support Structures Wound Margin: Distinct, outline attached Exudate Amount: Medium Exudate Type: Serosanguineous Exudate Color: red,  brown Foul Odor After Cleansing: No Slough/Fibrino No Wound Bed Granulation Amount: Large (67-100%) Exposed Structure Granulation Quality: Red Fascia Exposed: No Necrotic Amount: None Present (0%) Fat Layer (Subcutaneous Tissue) Exposed: Yes Tendon Exposed: No Muscle Exposed: No Joint Exposed: No Bone Exposed: No Treatment Notes Wound #5 (Lower Leg) Wound Laterality: Right, Lateral, Anterior Cleanser Soap and Water Discharge Instruction: May shower and wash wound with dial antibacterial soap and water prior to dressing change. Peri-Wound Care Zinc Oxide Ointment 30g tube Discharge Instruction: Apply Zinc Oxide to periwound with each dressing change as needed for maceration Sween Lotion (Moisturizing lotion) Discharge Instruction: Apply moisturizing lotion as directed Topical Primary Dressing FIBRACOL Plus Dressing, 2x2 in (collagen) Discharge Instruction: Moisten collagen with saline or hydrogel Secondary Dressing ABD Pad, 5x9 Discharge Instruction: Apply over primary dressing as directed. Xtrasorb Classic Super [employer], 6x9 (in/in) Discharge Instruction: Apply over primary dressing as directed. Secured With Elastic Bandage 4 inch (ACE bandage) Discharge Instruction: Secure with ACE bandage as directed. Kerlix Roll Sterile, 4.5x3.1 (in/yd) Discharge Instruction: Secure with Kerlix as directed. Compression Wrap Compression Stockings Add-Ons Electronic Signature(s) Signed: 02/06/2021 4:52:26 PM By: [name] RN, BSN Entered By: [name] on 02/06/2021 12:50:07 -------------------------------------------------------------------------------- Vitals Details Patient Name: Date of Service: Carlos White, Carlos RDO N H. 02/06/2021 12:30 PM Medical Record Number: [id number] Patient Account Number: [id number] Date of Birth/Sex: Treating RN: March 11, 1940 (81 y.o. [name] Primary Care Anamarie Hunn: [name] Other Clinician: Referring Jamelle Noy: Treating  Addylynn Balin/Extender: [name] in Treatment: 175 Vital Signs Time Taken: 12:40 Temperature (F):  98.1 Height (in): 71 Pulse (bpm): 76 Weight (lbs): 220 Respiratory Rate (breaths/min): 20 Body Mass Index (BMI): 30.7 Blood Pressure (mmHg): 160/81 Reference Range: 80 - 120 mg / dl Electronic Signature(s) Signed: 02/06/2021 4:52:26 PM By: [name] RN, BSN Entered By: [name] on 02/06/2021 12:42:25

## 2021-02-12 NOTE — Progress Notes (Addendum)
KEILAND, PICKERING ([id number]) Visit Report for 02/06/2021 Chief Complaint Document Details Patient Name: Date of Service: Carlos White 02/06/2021 12:30 PM Medical Record Number: [id number] Patient Account Number: [id number] Date of Birth/Sex: Treating RN: 1940/02/27 (81 y.o. [name] Primary Care Provider: [name] Other Clinician: Referring Provider: Treating Provider/Extender: [name] in Treatment: [phone] Information Obtained from: Patient Chief Complaint Bilateral LE Ulcers Electronic Signature(s) Signed: 02/06/2021 12:59:11 PM By: Worthy Keeler PA-C Entered By: Worthy Keeler on 02/06/2021 12:59:11 -------------------------------------------------------------------------------- HPI Details Patient Name: Date of Service: Carlos White RDO [name] 02/06/2021 12:30 PM Medical Record Number: [id number] Patient Account Number: [id number] Date of Birth/Sex: Treating RN: [birth year] (81 y.o. [name] Primary Care Provider: [name] Other Clinician: Referring Provider: Treating Provider/Extender: [name] in Treatment: 175 History of Present Illness HPI Description: 09/30/17 on evaluation today patient presents for initial evaluation and our clinic concerning issues that he has been having with his bilateral lower extremities. He states this has been going on for quite some time at least six months. Currently his regiment has been mainly cleaning the area with peroxide, applying the is foreign ointment, and wrapping the area with ABD pads and then an ace wrap loosely. He has dealt with issues of this nature he tells me for quite some time. He does have a history of having had a compound fracture of the left lower extremity which he thinks also makes this a much more difficult area for him to heal. He's previously been told that he had poor vascular flow but this was years ago at [facility] we do not have any of those  records at this time. He has a history of Epidermolysis Bullosa which was diagnosed around age 58 and he has been cared for at [facility] since that time. Subsequently he states this is hereditary and two of his children one male and one male also have this as well is one of his grandchildren that he is aware of. He has no evidence of infection necessarily at this point although he does have some necrotic tissue noted on the surface of the wound as far as the largest, left lateral lower extremity ulcer, is concerned. Overall I feel like all things considered he's been taking care of this very well. Obviously he has some fairly significant issues going on at this point in this regard. He does have a history otherwise of hypertension though for the most part other than the compound fracture of the left leg he seems to have been fairly healthy in my pinion. 10/07/17 on evaluation today patient actually appears to be doing better in regard to his bilateral lower extremity ulcers. With that being said he does still have some evidence of slough noted on the surface of the wounds I think the Iodoflex has been beneficial for him. His arterial studies are scheduled for October 2. With that being said I do believe that he is continuing to show signs of good improvement which is at least good news. 10/14/17 on evaluation today patient appears to be doing very well in regard to his lower extremity ulcers. He's definitely made some progress as far as healing is concerned although there still are several open areas that are going to need to be addressed. He did have his arterial study today which fortunately shows good findings with a right ABI of 1.23 with a TBI of 0.86 in the left ABI  of 1.28 with a TBI of 0.81. This is good news and will allow [place] to perform debridement as well. 10/23/2017; patient with a large wound on the left lateral calf, sizable area on the left medial malleolus and an area on the right lateral  malleolus. He has a new blister consistent with his underlying blistering skin disease just above this area we have been using Iodoflex on the lateral left calf lateral right ankle and collagen on the medial left ankle. We have been using Kerlix Coban wraps 10/28/17 on evaluation today the patient continues to have signs of improvement in regard to the overall appearance of the original wound. Unfortunately he did have some blistering over the right lateral lower extremity which has appeared to rupture on evaluation today and likely some of the dead tissue on the surface needs to be cleaned away the good news is this does not appear to be to significantly deep at this time. 11/04/17 on evaluation today patient actually appears to be doing a little better in regard to his lower extremity ulcers. He has been tolerating the dressing changes without complication. With that being said he does still have a significant wound especially over the left lateral lower extremity unfortunately. All of the wounds pretty much are going to require sharp debridement today. 11/11/17 on evaluation today patient appears to be doing more poorly in regard to his left lower extremity in particular. There does not appear to be any evidence of systemic infection although the wound itself as far as the larger left lateral lower extremity ulcer actually appears to be infected in my pinion. There's an older and the surface of the wound is dramatically worsened compared to last week. No fevers, chills, nausea, or vomiting noted at this time. 11/18/17 upon evaluation today patient actually appears to be doing better. I did review his culture today which really did not show any specific organism is a positive reason for his wound decline. There are multiple organisms present not predominant. Nonetheless he seems to be tolerate the doxycycline well in his wounds in general do seem to be doing better. Fortunately there does not appear  to be any evidence of infection at this time which is good news. Overall I'm very pleased with how things appear. Nonetheless he still has a lot of healing to Carlos. I do think he could benefit from a Juxta-Lite wrap. 11/25/17 on evaluation today patient actually appears to be doing fairly well in regard to his wounds. He is still taking the antibiotics he has a few days left. Fortunately this seems to have been excellent for him as far as getting the infection control and very happy in this regard. With that being said the patient likewise is also very pleased with how things appear at this time in comparison to where we were he's not having as much pain. 12/02/17 Seen today for follow p and management of LLE wounds. Wounds appear to show some improvement. He denies pain, fever, or chills. Completed a course of doxycycline earlier this month. Scheduled to received Juxta-Lite wrap this week. No s/s of infections. 12/09/17 on evaluation today patient actually appears to be doing a little bit better in regard to his wounds. This is obscene very slow process and unfortunately he has a couple of new areas and this is due to the Epidermolysis Bullosa. Nonetheless I am concerned about the fact that he seems to be getting more areas not less that is the reason we're gonna work on  getting schedule for the vascular referral to see the venous specialist. 12/23/17 upon evaluation today patient's wounds currently shows evidence of still not doing quite as well is what I would like to have seen. Subsequently the patient did have his venous study which showed evidence of venous stasis. Subsequently I do think that a vascular evaluation for consideration of venous intervention would be appropriate. I'm not necessarily suggesting that will be anything that can be done but I think it is at least a good idea. He is in agreement with this plan. 12/30/17 on evaluation today patient actually appears to be doing very well in  regard to his wounds when compared to previous evaluation. Subsequently we have been using the [facility] Dressing which actually appears to have done excellent on his left lateral lower extremity ulcer. The quality of the wound surface is dramatically improved. There is some slight debridement that is going to be required at a couple of locations but overall I'm extremely pleased with how things appear here. 01/07/2018; this is a patient with a primary skin disorder epidermolyis bullosa. Is a large wound on the left lateral calf and smaller wounds on the right however there is a new wound on the right mid tibia area that occurred within the compression wrap that he did not change. We have been using Hydrofera Blue. On the left he is using Hydrofera Blue and Santyl to the inferior part of the wound and changing the dressing himself. 01/15/2018; primary skin disorder epidermolysis bullosa. He has several difficult wounds including the left lateral calf, smaller wounds on the left medial calf and the right lateral calf. The major area on the left lateral calf has a smaller area inferiorly that has necrotic debris we have been using Santyl to this. The rest of the wounds we have been using Hydrofera Blue. The area on the left calf actually looks larger this week. Uncontrolled edema several small open areas above it that are superficial 01/20/18 on evaluation today patient appears to be doing better as compared to last week in regard to his wounds of the bilateral lower extremities. He tolerated the bilateral compression wrap without complication. Overall I'm very pleased with how things appear at this time. The patient likewise is very happy. 01/27/18 on evaluation today patient appears to be doing decently well in regard to his bilateral lower extremity ulcers. He's been tolerating the dressing changes without complication. One issue he had is that he did have more drainage to the left leg wrapped last  week. He states in fact he probably should come in and let us change it on Friday however he just left it in place and kept adding extra absorption with ABD pads to the external portion of the wrap. Unfortunately he does have some aspiration type breakdown nothing significant but I do believe that this was probably counterproductive in general. Nonetheless his wounds do not appear to be terrible overall. 02/03/18 on evaluation today patient appears to be doing rather well in regard to his lower extremity ulcers. He has been tolerating the dressing changes without complication. He does tell me that he had to change the wrap on the left one since we last saw him. Subsequently I do not see any evidence of infection I do feel like the food was much better controlled at this point. 02/10/18 on evaluation today patient appears to be doing rather well in regard to his ulcers. He still has significant alterations especially on the left lateral lower extremity.  Fortunately there's no signs of infection at this time. Overall I feel like he is making good progress are some areas that I'm gonna attempt some debridement today. 02/17/18 on evaluation today patient appears to be doing okay in regard to his lower Trinity ulcer. It does appear on both locations he has a little bit of drainage causing some breakdown in maceration around the wound bed's although it doesn't appear to be too bad the right is a little bit worse than left. Fortunately there's no signs of infection which is good news. No fevers, chills, nausea, or vomiting noted at this time. 03/10/18 on evaluation today patient actually appears to be doing rather poorly in regard to his bilateral lower extremity ulcers. The right in particular is draining profusely and the wound is actually enlarging which is not good. I'm concerned about both possibly infection and the fact that there's a lot of moisture which is causing breakdown as well. Unfortunately the  patient has been trying to change this at home I'm afraid he may need to change more frequently in order to see the improvement that were looking for. There's no signs of systemic infection. 03/17/18 patient actually appears to be doing significantly better at this point in regard to his bilateral lower extremity ulcers. Fortunately there's no signs of infection. That is worsening infection at least indefinitely nothing systemic. With that being said he is having a lot of drainage though not quite as much is there in his last evaluation. Overall feel like he's on the side of improvement. I think if his results back from his culture which showed that he had a positive group B strep along with abundant Pseudomonas noted on the culture. For that reason I am gonna have him continue with the linezolid as we previously have ordered for him and I did Carlos ahead as well today and prescribe Levaquin as well in order to treat the Pseudomonas portion of the infection noted. 03/24/18 on evaluation today patient actually appears to be doing very well in regard to his lower Trinity ulcer. He's been tolerating the dressing changes without complication. Fortunately both legs show signs of less drainage in his edema is very well controlled at this point as well. Overall very pleased with how things seem to be progressing. 03/31/18 on evaluation today patient actually appears to be doing excellent in regard to his bilateral lower extremity ulcers. These are not draining nearly as significant as what they were in the past overall seem to be shown signs of excellent improvement which is great news. Fortunately there is no sign of active infection at this time I do believe that the Levaquin has done extremely well for him in this regard. The patient continues to change these at home typically every day. We may be able to slowly work towards every other day changes since the drainage seems to be slowing down quite  significantly. 04/14/18 on evaluation today patient appears to be doing well in regard to his bilateral lower extremities. Let me Hilda Blades Almost completely healed which is excellent news. Fortunately he's shown signs of improvement all other sites as well with new skin growth there's some slight hyper granular tissue but for the most part this seems to be well maintained with the [facility] Dressing. I'm very happy in this regard. 04/28/18 on evaluation today patient appears to be doing rather well in regard to his ulcers of the bilateral lower extremities all things considering. He continues to make some progress as far  as new skin growth. There still some hyper granulation noted at this point despite the use of the [facility] Dressing. This is not terrible but I think we may want to consider conclude cauterization today with silver nitrate to try to help knock some of his back as well as helping with any biofilm on the surface of the wound. 05/12/18 on evaluation today patient's wounds actually appear to be doing fairly well in regard to the bilateral lower extremities. He's been tolerating the dressing changes without complication. Fortunately there's no signs of active infection at this time which is good news. Overall very pleased with how things seem to be progressing. You select silver nitrate was beneficial for him. 05/26/18 on evaluation today patient appears to be doing better in regard to left lower extremity and a little bit worse in regard to the right lower extremity. He states that he was pulling off the [facility] Dressing peel back some of the skin making this area significantly larger than what it was previous. He's not had any issues other than this and states even that hasn't caused any pain he just seems to obviously have a much larger area on the right when compared to the previous time I saw him. No fevers, chills, nausea, or vomiting noted at this time. 06/16/18 on  evaluation today patient actually appears to be doing a little better in my pinion in regard to his lower summary ulcers. He has new skin islands that seem to be spreading which is good news. Fortunately there's no signs of active infection at this time. His biggest issue is he tells me that coming as often as he does is becoming very cost prohibitive. He wonders if we can potentially spread this out. 07/14/18 on evaluation today patient appears to be doing a little bit worse in regard to his lower from the ulcer. Unfortunately he still continues to have a significant amount of drainage I think we need to do something to try to help this more. He is still somewhat reluctant to Carlos the route of the Wound VAC although that may be the most appropriate thing for him. No fevers, chills, nausea, or vomiting noted at this time. 08/18/2018 on evaluation today patient actually appears to be doing quite well with regard to his bilateral lower extremity ulcers. I do feel like that currently he is making great progress the care max does seem to be doing a great job at helping to control the moisture he has no maceration or skin breakdown. Again this seems to be an excellent way to Carlos. 1 thing we may want to change is adding collagen to the base of the wound and then the care max over top he is not opposed to this. 09/15/2018 on evaluation today patient appears to be doing well with regard to his bilateral lower extremity ulcers. He is showing some signs of improvement not necessarily in size but definitely in appearance. In fact he has a lot of new skin growing throughout the wounds along the edges as well as in the central portion of the wounds on both lower extremities. Overall I am extremely pleased to see this. 10/20/2018 on evaluation today patient actually appears to be doing quite well with regard to his wounds. They are not measuring significantly smaller but he does have a lot of new epithelization noted as  compared to previous. Fortunately there is no signs of active infection at this time. No fevers, chills, nausea, vomiting, or diarrhea. 11/17/2018  on evaluation today patient presents for reevaluation concerning his bilateral lower extremity ulcers. Fortunately there is no signs of active infection at this time today. He has been tolerating the dressing changes without complication. No fevers, chills, nausea, vomiting, or diarrhea. Unfortunately in general the patient has not made as much improvement as I would like to have seen up to this point. He has been tolerating the dressing changes without complication and he does an excellent job taking care of his wounds at home in my opinion. The biggest issue I see is that he is just not making the progress that we need to be seeing currently. I think we may want to consider having him seen at a plastic surgery appointment and he has previously seen someone in years past at [facility] in [place]. That is definitely a possibility for [place] to look into at this point. 12/29/2018 on evaluation today patient appears to be doing better in regard to the overall visual appearance of his wounds which do not appear to be as macerated. He does have a much larger skin island in the middle of the left lower extremity ulcer which is doing much better. He tells me the pain is also significantly better. With that being said overall his improvement as far as the size of the wounds is not better but again these are very irregular in change shape quite often. Fortunately there is no evidence of active infection at this time which is great news. He never heard anything from [facility] regarding the plastic surgery referral that we made to them. 01/26/2019 upon evaluation today patient appears to be doing a little better in regard to his wounds today. He has been tolerating the dressing changes again he performs these for the most part on his own. He does a great  job wrapping his legs in my opinion. Unfortunately he has not been able to get down to [facility] to see if there is anything from a plastic surgery standpoint that could be done to help with his legs simply due to the fact that his wife unfortunately sustained a compression fracture in her spine she is seeing Dr. Saintclair Halsted and subsequently is going to be having what sounds to be a kyphoplasty type procedure. With that being said that has not been scheduled yet there is still waiting on an MRI the patient is very busy in fact overly busy trying to help take care of his wife at this point. I completely understand this is more of a strain on him at this time 02/23/2019 upon evaluation today patient actually appears to be making some progress. I am actually very pleased with the overall appearance of his wounds even compared to last evaluation. He seems to be doing quite well. He is taking care of his wife unfortunately she did have a compression fracture she has had the procedure for this but still she has a slow road to recovery. For that reason he still not gone to [facility] for a second opinion in this regard. Obviously the goal there was if there was anything that can be done from a skin graft standpoint or otherwise. 03/23/2019 upon evaluation today patient continues to have issues with lower extremity ulcers. Since the beginning he has made progress but at the same time the wounds unfortunately just will not close. We have been trying to get the patient to [facility] to see a specialist there but unfortunately with the everything going on with his  wife he has not been able to make that appointment time yet he states he may be able to in the next 1-2 months but is not really sure. 04/27/2019 on evaluation today patient appears to be doing a little bit more poorly. His last evaluation. He appears to have some erythema around the edges of the wound at this point. Fortunately there is no  signs of active infection at this time which is good news. No fevers, chills, nausea, vomiting, or diarrhea. 06/08/2019 on evaluation today patient appears to be doing well with regard to his wounds. Overall they are actually measuring smaller compared to the last visit last month. We did treat him for Pseudomonas as well as methicillin-resistant Staph aureus. He was only on the treatment for MRSA however for 7 days as the Cipro was resistant and subsequently we had to place him on doxycycline. Nonetheless I am thinking that we may want to add the doxycycline and just do a month-long treatment considering the longstanding nature of his wounds and see if we get this under better control. 07/13/2019 upon evaluation today patient appears to be doing fairly well in regard to his bilateral lower extremities. There does not appear to be any signs of active infection which is good news. No fevers, chills, nausea, vomiting, or diarrhea. 08/10/2019 upon evaluation today patient appears to be doing about the same with regard to his wounds in general. Unfortunately he is not significantly better although is also not significantly worse which is great news there is no evidence of active infection at this time which is good news. He still dealing with a lot going on with his wife and therefore is not really able to Carlos see anyone at the specialty clinic at [facility] that we have previously set up still. 09/07/2019 on evaluation today patient appears to be doing well with regard to his wounds. In fact this is probably the best that have seen so far in quite a few months. Overall I am very pleased with where things stand at this time. No fevers, chills, nausea, vomiting, or diarrhea. 10/05/2019 upon evaluation today patient appears to be doing more poorly in regard to his legs at this point. He actually is showing some signs of infection. This has been something that we seem to be back-and-forth with as far as trying to keep  these wounds from becoming infected. He takes care of them very well in my opinion but nonetheless I am concerned in this regard. He tells me that his wife is still doing really about the same she is slowly getting better. Nonetheless he still spends the majority of his time helping to take care of her. 11/02/2019 upon evaluation today patient actually appears to be doing somewhat better in regard to his legs. I do believe that the compounded antibiotic treatment from [facility] has been beneficial for him. Overall I am extremely pleased with where things stand today. There is no signs of active infection at this time. The patient states he has much less drainage than he has in the past. 12/07/2019 upon evaluation today patient appears to be doing well at this time in regard to the overall appearance of his wound bed. Currently there is no signs of active infection at this time. 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 Especially when  he stressed. Fortunately there is no signs of active infection locally or systemically at this point. 01/18/2020 on evaluation today patient appears to be doing well with regard to his leg ulcers. He has been tolerating the dressing changes without complication. Fortunately there is no signs of infection and overall I feel like his legs are doing about the best they have done in quite some time. There does not appear to be any evidence of active infection which is great news and overall very pleased. 02/29/2020 upon evaluation today patient's wounds actually appear to be doing quite well currently. There is no sign of active infection at this time. No fevers, chills, nausea, vomiting, or diarrhea. 04/11/2025 on evaluation today patient appears to be doing excellent in regard to his wounds on the legs to be  honest. He has made a lot of progress there does not appear to be any signs of active infection and overall I think that he is better than previous although still the wounds are quite significant obviously. In general I think that he is pleased with where things stand at this point. Still there is quite a bit of work to do here. 07/13/2020 upon evaluation today patient appears to be doing well with regard to his wound. Is been tolerating the dressing changes without complication on both legs. He just has the 2 main wounds on each leg at the ankle on the medial region of the right leg is completely healed. His wounds do seem to have gotten better during the time that he has been on bedrest as result of the pelvis fracture. Obviously does not the way that we wanted to see things improved but nonetheless he tells me that it is what it is. Fortunately he does seem to be doing better he tells me that her daughter from the [employer] is actually down helping to take care of them out as he and his wife while he is recovering. 08/08/2020 upon evaluation today patient appears to be doing well currently in regard to his leg ulcers. He has been tolerating the dressing changes without complication. Fortunately there is no signs of active infection at this time. No fevers, chills, nausea, vomiting, or diarrhea. 09/26/2020 upon evaluation today patient appears to be doing well with regard to his wound. He has been showing signs of good improvement which is great news and overall I am extremely pleased with where things stand today. I do think that he does well with his dressing changes and overall has really improved significantly here. segment how that how she doing 11/14/2020 upon evaluation today patient appears to be doing well with regard to his leg ulcers. He has been tolerating the dressing changes without complication. Fortunately there does not appear to be any signs of active infection at this time. No fevers,  chills, nausea, vomiting, or diarrhea. 12/26/2020 upon evaluation today patient actually appears to be making some good progress here in regard to his wounds. Fortunately I do not see any signs of active infection which is great news. No fevers, chills, nausea, vomiting, or diarrhea. 02/06/2021 upon evaluation today patient appears to be doing well with regard to his wound currently. He has been tolerating the dressing changes without complication. Fortunately there does not appear to be any evidence of active infection locally nor systemically at this time. No fevers, chills, nausea, vomiting, or diarrhea. Overall I think that this is very slow but does seem to be getting a little bit better and a little smaller he  has been through a lot of stress recently with his wife she is having a heart evaluation tomorrow to see whether or not an aortic valve replacement is something that she could tolerate and undergo. Electronic Signature(s) Signed: 02/06/2021 1:07:18 PM By: Worthy Keeler PA-C Entered By: Worthy Keeler on 02/06/2021 13:07:18 -------------------------------------------------------------------------------- Physical Exam Details Patient Name: Date of Service: Carlos White 02/06/2021 12:30 PM Medical Record Number: [id number] Patient Account Number: [id number] Date of Birth/Sex: Treating RN: [birth year] (81 y.o. [name] Primary Care Provider: [name] Other Clinician: Referring Provider: Treating Provider/Extender: [name] in Treatment: 71 Constitutional Well-nourished and well-hydrated in no acute distress. Respiratory normal breathing without difficulty. Psychiatric this patient is able to make decisions and demonstrates good insight into disease process. Alert and Oriented x 3. pleasant and cooperative. Notes Upon inspection patient's wound bed actually showed signs of good improvement which is excellent news. Fortunately I do not  see any signs of active infection locally nor systemically at this time which is great news. No fevers, chills, nausea, vomiting, or diarrhea. Electronic Signature(s) Signed: 02/06/2021 1:07:42 PM By: Worthy Keeler PA-C Entered By: Worthy Keeler on 02/06/2021 13:07:42 -------------------------------------------------------------------------------- Physician Orders Details Patient Name: Date of Service: Carlos White RDO [name] 02/06/2021 12:30 PM Medical Record Number: [id number] Patient Account Number: [id number] Date of Birth/Sex: Treating RN: 07-Oct-1940 (81 y.o. [name] Primary Care Provider: [name] Other Clinician: Referring Provider: Treating Provider/Extender: [name] in Treatment: [phone] Verbal / Phone Orders: No Diagnosis Coding Follow-up Appointments ppointment in: - 6 weeks with Margarita Grizzle Return A Other: - Prism=Supplies Bathing/ Shower/ Hygiene May shower and wash wound with soap and water. - on days that dressing is changed Edema Control - Lymphedema / SCD / Other Bilateral Lower Extremities Elevate legs to the level of the heart or above for 30 minutes daily and/or when sitting, a frequency of: - throughout the day Avoid standing for long periods of time. Exercise regularly Moisturize legs daily. Compression stocking or Garment 20-30 mm/Hg pressure to: - Juxtalite to both legs daily Wound Treatment Wound #2 - Lower Leg Wound Laterality: Left, Lateral, Distal Cleanser: Soap and Water 2 x Per Week/30 Days Discharge Instructions: May shower and wash wound with dial antibacterial soap and water prior to dressing change. Peri-Wound Care: Zinc Oxide Ointment 30g tube 2 x Per Week/30 Days Discharge Instructions: Apply Zinc Oxide to periwound with each dressing change as needed for maceration Peri-Wound Care: Sween Lotion (Moisturizing lotion) 2 x Per Week/30 Days Discharge Instructions: Apply moisturizing lotion as directed Prim Dressing:  FIBRACOL Plus Dressing, 2x2 in (collagen) 2 x Per Week/30 Days ary Discharge Instructions: Moisten collagen with saline or hydrogel Secondary Dressing: ABD Pad, 5x9 (Generic) 2 x Per Week/30 Days Discharge Instructions: Apply over primary dressing as directed. Secondary Dressing: Xtrasorb Classic Super Absorbent Dressing, 6x9 (in/in) (Dispense As Written) 2 x Per Week/30 Days Discharge Instructions: Apply over primary dressing as directed. Secured With: Elastic Bandage 4 inch (ACE bandage) 2 x Per Week/30 Days Discharge Instructions: Secure with ACE bandage as directed. Secured With: [employer], 4.5x3.1 (in/yd) (Generic) 2 x Per Week/30 Days Discharge Instructions: Secure with Kerlix as directed. Wound #5 - Lower Leg Wound Laterality: Right, Lateral, Anterior Cleanser: Soap and Water 2 x Per Week/30 Days Discharge Instructions: May shower and wash wound with dial antibacterial soap and water prior to dressing change. Peri-Wound Care: Zinc Oxide Ointment  30g tube 2 x Per Week/30 Days Discharge Instructions: Apply Zinc Oxide to periwound with each dressing change as needed for maceration Peri-Wound Care: Sween Lotion (Moisturizing lotion) 2 x Per Week/30 Days Discharge Instructions: Apply moisturizing lotion as directed Prim Dressing: FIBRACOL Plus Dressing, 2x2 in (collagen) 2 x Per Week/30 Days ary Discharge Instructions: Moisten collagen with saline or hydrogel Secondary Dressing: ABD Pad, 5x9 (Generic) 2 x Per Week/30 Days Discharge Instructions: Apply over primary dressing as directed. Secondary Dressing: Xtrasorb Classic Super Absorbent Dressing, 6x9 (in/in) (Generic) 2 x Per Week/30 Days Discharge Instructions: Apply over primary dressing as directed. Secured With: Elastic Bandage 4 inch (ACE bandage) 2 x Per Week/30 Days Discharge Instructions: Secure with ACE bandage as directed. Secured With: [employer], 4.5x3.1 (in/yd) (Generic) 2 x Per Week/30 Days Discharge  Instructions: Secure with Kerlix as directed. Electronic Signature(s) Signed: 02/06/2021 3:45:35 PM By: Worthy Keeler PA-C Signed: 02/06/2021 4:52:26 PM By: [name] RN, BSN Entered By: [name] on 02/06/2021 13:05:59 -------------------------------------------------------------------------------- Problem List Details Patient Name: Date of Service: Carlos White, Carlos RDO [name] 02/06/2021 12:30 PM Medical Record Number: [id number] Patient Account Number: [id number] Date of Birth/Sex: Treating RN: April 26, 1940 (81 y.o. [name] Primary Care Provider: [name] Other Clinician: Referring Provider: Treating Provider/Extender: [name] in Treatment: [phone] Active Problems ICD-10 Encounter Code Description Active Date MDM Diagnosis I87.2 Venous insufficiency (chronic) (peripheral) 09/30/2017 No Yes Q81.8 Other epidermolysis bullosa 09/30/2017 No Yes L97.322 Non-pressure chronic ulcer of left ankle with fat layer exposed 09/30/2017 No Yes L97.822 Non-pressure chronic ulcer of other part of left lower leg with fat layer exposed9/18/2019 No Yes L97.812 Non-pressure chronic ulcer of other part of right lower leg with fat layer 09/30/2017 No Yes exposed I10 Essential (primary) hypertension 09/30/2017 No Yes Inactive Problems Resolved Problems Electronic Signature(s) Signed: 02/06/2021 12:58:52 PM By: Worthy Keeler PA-C Entered By: Worthy Keeler on 02/06/2021 12:58:51 -------------------------------------------------------------------------------- Progress Note Details Patient Name: Date of Service: Carlos White RDO [name] 02/06/2021 12:30 PM Medical Record Number: [id number] Patient Account Number: [id number] Date of Birth/Sex: Treating RN: [birth year] (81 y.o. [name] Primary Care Provider: [name] Other Clinician: Referring Provider: Treating Provider/Extender: [name] in Treatment: [phone] Subjective Chief  Complaint Information obtained from Patient Bilateral LE Ulcers History of Present Illness (HPI) 09/30/17 on evaluation today patient presents for initial evaluation and our clinic concerning issues that he has been having with his bilateral lower extremities. He states this has been going on for quite some time at least six months. Currently his regiment has been mainly cleaning the area with peroxide, applying the is foreign ointment, and wrapping the area with ABD pads and then an ace wrap loosely. He has dealt with issues of this nature he tells me for quite some time. He does have a history of having had a compound fracture of the left lower extremity which he thinks also makes this a much more difficult area for him to heal. He's previously been told that he had poor vascular flow but this was years ago at [facility] we do not have any of those records at this time. He has a history of Epidermolysis Bullosa which was diagnosed around age 20 and he has been cared for at [facility] since that time. Subsequently he states this is hereditary and two of his children one male and one male also have this as well is one of his grandchildren that  he is aware of. He has no evidence of infection necessarily at this point although he does have some necrotic tissue noted on the surface of the wound as far as the largest, left lateral lower extremity ulcer, is concerned. Overall I feel like all things considered he's been taking care of this very well. Obviously he has some fairly significant issues going on at this point in this regard. He does have a history otherwise of hypertension though for the most part other than the compound fracture of the left leg he seems to have been fairly healthy in my pinion. 10/07/17 on evaluation today patient actually appears to be doing better in regard to his bilateral lower extremity ulcers. With that being said he does still have some evidence of slough noted on the surface of  the wounds I think the Iodoflex has been beneficial for him. His arterial studies are scheduled for October 2. With that being said I do believe that he is continuing to show signs of good improvement which is at least good news. 10/14/17 on evaluation today patient appears to be doing very well in regard to his lower extremity ulcers. He's definitely made some progress as far as healing is concerned although there still are several open areas that are going to need to be addressed. He did have his arterial study today which fortunately shows good findings with a right ABI of 1.23 with a TBI of 0.86 in the left ABI of 1.28 with a TBI of 0.81. This is good news and will allow [place] to perform debridement as well. 10/23/2017; patient with a large wound on the left lateral calf, sizable area on the left medial malleolus and an area on the right lateral malleolus. He has a new blister consistent with his underlying blistering skin disease just above this area we have been using Iodoflex on the lateral left calf lateral right ankle and collagen on the medial left ankle. We have been using Kerlix Coban wraps 10/28/17 on evaluation today the patient continues to have signs of improvement in regard to the overall appearance of the original wound. Unfortunately he did have some blistering over the right lateral lower extremity which has appeared to rupture on evaluation today and likely some of the dead tissue on the surface needs to be cleaned away the good news is this does not appear to be to significantly deep at this time. 11/04/17 on evaluation today patient actually appears to be doing a little better in regard to his lower extremity ulcers. He has been tolerating the dressing changes without complication. With that being said he does still have a significant wound especially over the left lateral lower extremity unfortunately. All of the wounds pretty much are going to require sharp debridement  today. 11/11/17 on evaluation today patient appears to be doing more poorly in regard to his left lower extremity in particular. There does not appear to be any evidence of systemic infection although the wound itself as far as the larger left lateral lower extremity ulcer actually appears to be infected in my pinion. There's an older and the surface of the wound is dramatically worsened compared to last week. No fevers, chills, nausea, or vomiting noted at this time. 11/18/17 upon evaluation today patient actually appears to be doing better. I did review his culture today which really did not show any specific organism is a positive reason for his wound decline. There are multiple organisms present not predominant. Nonetheless he seems to be  tolerate the doxycycline well in his wounds in general do seem to be doing better. Fortunately there does not appear to be any evidence of infection at this time which is good news. Overall I'm very pleased with how things appear. Nonetheless he still has a lot of healing to Carlos. I do think he could benefit from a Juxta-Lite wrap. 11/25/17 on evaluation today patient actually appears to be doing fairly well in regard to his wounds. He is still taking the antibiotics he has a few days left. Fortunately this seems to have been excellent for him as far as getting the infection control and very happy in this regard. With that being said the patient likewise is also very pleased with how things appear at this time in comparison to where we were he's not having as much pain. 12/02/17 Seen today for follow p and management of LLE wounds. Wounds appear to show some improvement. He denies pain, fever, or chills. Completed a course of doxycycline earlier this month. Scheduled to received Juxta-Lite wrap this week. No s/s of infections. 12/09/17 on evaluation today patient actually appears to be doing a little bit better in regard to his wounds. This is obscene very slow  process and unfortunately he has a couple of new areas and this is due to the Epidermolysis Bullosa. Nonetheless I am concerned about the fact that he seems to be getting more areas not less that is the reason we're gonna work on getting schedule for the vascular referral to see the venous specialist. 12/23/17 upon evaluation today patient's wounds currently shows evidence of still not doing quite as well is what I would like to have seen. Subsequently the patient did have his venous study which showed evidence of venous stasis. Subsequently I do think that a vascular evaluation for consideration of venous intervention would be appropriate. I'm not necessarily suggesting that will be anything that can be done but I think it is at least a good idea. He is in agreement with this plan. 12/30/17 on evaluation today patient actually appears to be doing very well in regard to his wounds when compared to previous evaluation. Subsequently we have been using the [employer] Dressing which actually appears to have done excellent on his left lateral lower extremity ulcer. The quality of the wound surface is dramatically improved. There is some slight debridement that is going to be required at a couple of locations but overall I'm extremely pleased with how things appear here. 01/07/2018; this is a patient with a primary skin disorder epidermolyis bullosa. Is a large wound on the left lateral calf and smaller wounds on the right however there is a new wound on the right mid tibia area that occurred within the compression wrap that he did not change. We have been using Hydrofera Blue. On the left he is using Hydrofera Blue and Santyl to the inferior part of the wound and changing the dressing himself. 01/15/2018; primary skin disorder epidermolysis bullosa. He has several difficult wounds including the left lateral calf, smaller wounds on the left medial calf and the right lateral calf. The major area on the  left lateral calf has a smaller area inferiorly that has necrotic debris we have been using Santyl to this. The rest of the wounds we have been using Hydrofera Blue. The area on the left calf actually looks larger this week. Uncontrolled edema several small open areas above it that are superficial 01/20/18 on evaluation today patient appears to be doing better  as compared to last week in regard to his wounds of the bilateral lower extremities. He tolerated the bilateral compression wrap without complication. Overall I'm very pleased with how things appear at this time. The patient likewise is very happy. 01/27/18 on evaluation today patient appears to be doing decently well in regard to his bilateral lower extremity ulcers. He's been tolerating the dressing changes without complication. One issue he had is that he did have more drainage to the left leg wrapped last week. He states in fact he probably should come in and let us change it on Friday however he just left it in place and kept adding extra absorption with ABD pads to the external portion of the wrap. Unfortunately he does have some aspiration type breakdown nothing significant but I do believe that this was probably counterproductive in general. Nonetheless his wounds do not appear to be terrible overall. 02/03/18 on evaluation today patient appears to be doing rather well in regard to his lower extremity ulcers. He has been tolerating the dressing changes without complication. He does tell me that he had to change the wrap on the left one since we last saw him. Subsequently I do not see any evidence of infection I do feel like the food was much better controlled at this point. 02/10/18 on evaluation today patient appears to be doing rather well in regard to his ulcers. He still has significant alterations especially on the left lateral lower extremity. Fortunately there's no signs of infection at this time. Overall I feel like he is making good  progress are some areas that I'm gonna attempt some debridement today. 02/17/18 on evaluation today patient appears to be doing okay in regard to his lower Trinity ulcer. It does appear on both locations he has a little bit of drainage causing some breakdown in maceration around the wound bed's although it doesn't appear to be too bad the right is a little bit worse than left. Fortunately there's no signs of infection which is good news. No fevers, chills, nausea, or vomiting noted at this time. 03/10/18 on evaluation today patient actually appears to be doing rather poorly in regard to his bilateral lower extremity ulcers. The right in particular is draining profusely and the wound is actually enlarging which is not good. I'm concerned about both possibly infection and the fact that there's a lot of moisture which is causing breakdown as well. Unfortunately the patient has been trying to change this at home I'm afraid he may need to change more frequently in order to see the improvement that were looking for. There's no signs of systemic infection. 03/17/18 patient actually appears to be doing significantly better at this point in regard to his bilateral lower extremity ulcers. Fortunately there's no signs of infection. That is worsening infection at least indefinitely nothing systemic. With that being said he is having a lot of drainage though not quite as much is there in his last evaluation. Overall feel like he's on the side of improvement. I think if his results back from his culture which showed that he had a positive group B strep along with abundant Pseudomonas noted on the culture. For that reason I am gonna have him continue with the linezolid as we previously have ordered for him and I did Carlos ahead as well today and prescribe Levaquin as well in order to treat the Pseudomonas portion of the infection noted. 03/24/18 on evaluation today patient actually appears to be doing very well in regard  to  his lower Trinity ulcer. He's been tolerating the dressing changes without complication. Fortunately both legs show signs of less drainage in his edema is very well controlled at this point as well. Overall very pleased with how things seem to be progressing. 03/31/18 on evaluation today patient actually appears to be doing excellent in regard to his bilateral lower extremity ulcers. These are not draining nearly as significant as what they were in the past overall seem to be shown signs of excellent improvement which is great news. Fortunately there is no sign of active infection at this time I do believe that the Levaquin has done extremely well for him in this regard. The patient continues to change these at home typically every day. We may be able to slowly work towards every other day changes since the drainage seems to be slowing down quite significantly. 04/14/18 on evaluation today patient appears to be doing well in regard to his bilateral lower extremities. Let me [facility] Almost completely healed which is excellent news. Fortunately he's shown signs of improvement all other sites as well with new skin growth there's some slight hyper granular tissue but for the most part this seems to be well maintained with the [facility] Dressing. I'm very happy in this regard. 04/28/18 on evaluation today patient appears to be doing rather well in regard to his ulcers of the bilateral lower extremities all things considering. He continues to make some progress as far as new skin growth. There still some hyper granulation noted at this point despite the use of the [facility] Dressing. This is not terrible but I think we may want to consider conclude cauterization today with silver nitrate to try to help knock some of his back as well as helping with any biofilm on the surface of the wound. 05/12/18 on evaluation today patient's wounds actually appear to be doing fairly well in regard to the bilateral  lower extremities. He's been tolerating the dressing changes without complication. Fortunately there's no signs of active infection at this time which is good news. Overall very pleased with how things seem to be progressing. You select silver nitrate was beneficial for him. 05/26/18 on evaluation today patient appears to be doing better in regard to left lower extremity and a little bit worse in regard to the right lower extremity. He states that he was pulling off the [facility] Dressing peel back some of the skin making this area significantly larger than what it was previous. He's not had any issues other than this and states even that hasn't caused any pain he just seems to obviously have a much larger area on the right when compared to the previous time I saw him. No fevers, chills, nausea, or vomiting noted at this time. 06/16/18 on evaluation today patient actually appears to be doing a little better in my pinion in regard to his lower summary ulcers. He has new skin islands that seem to be spreading which is good news. Fortunately there's no signs of active infection at this time. His biggest issue is he tells me that coming as often as he does is becoming very cost prohibitive. He wonders if we can potentially spread this out. 07/14/18 on evaluation today patient appears to be doing a little bit worse in regard to his lower from the ulcer. Unfortunately he still continues to have a significant amount of drainage I think we need to do something to try to help this more. He is still somewhat  reluctant to Carlos the route of the Wound VAC although that may be the most appropriate thing for him. No fevers, chills, nausea, or vomiting noted at this time. 08/18/2018 on evaluation today patient actually appears to be doing quite well with regard to his bilateral lower extremity ulcers. I do feel like that currently he is making great progress the care max does seem to be doing a great job at helping to  control the moisture he has no maceration or skin breakdown. Again this seems to be an excellent way to Carlos. 1 thing we may want to change is adding collagen to the base of the wound and then the care max over top he is not opposed to this. 09/15/2018 on evaluation today patient appears to be doing well with regard to his bilateral lower extremity ulcers. He is showing some signs of improvement not necessarily in size but definitely in appearance. In fact he has a lot of new skin growing throughout the wounds along the edges as well as in the central portion of the wounds on both lower extremities. Overall I am extremely pleased to see this. 10/20/2018 on evaluation today patient actually appears to be doing quite well with regard to his wounds. They are not measuring significantly smaller but he does have a lot of new epithelization noted as compared to previous. Fortunately there is no signs of active infection at this time. No fevers, chills, nausea, vomiting, or diarrhea. 11/17/2018 on evaluation today patient presents for reevaluation concerning his bilateral lower extremity ulcers. Fortunately there is no signs of active infection at this time today. He has been tolerating the dressing changes without complication. No fevers, chills, nausea, vomiting, or diarrhea. Unfortunately in general the patient has not made as much improvement as I would like to have seen up to this point. He has been tolerating the dressing changes without complication and he does an excellent job taking care of his wounds at home in my opinion. The biggest issue I see is that he is just not making the progress that we need to be seeing currently. I think we may want to consider having him seen at a plastic surgery appointment and he has previously seen someone in years past at [facility] in [place]. That is definitely a possibility for [place] to look into at this point. 12/29/2018 on evaluation today patient  appears to be doing better in regard to the overall visual appearance of his wounds which do not appear to be as macerated. He does have a much larger skin island in the middle of the left lower extremity ulcer which is doing much better. He tells me the pain is also significantly better. With that being said overall his improvement as far as the size of the wounds is not better but again these are very irregular in change shape quite often. Fortunately there is no evidence of active infection at this time which is great news. He never heard anything from [facility] regarding the plastic surgery referral that we made to them. 01/26/2019 upon evaluation today patient appears to be doing a little better in regard to his wounds today. He has been tolerating the dressing changes again he performs these for the most part on his own. He does a great job wrapping his legs in my opinion. Unfortunately he has not been able to get down to [facility] to see if there is anything from a plastic surgery standpoint that could be done to help with  his legs simply due to the fact that his wife unfortunately sustained a compression fracture in her spine she is seeing Dr. Saintclair Halsted and subsequently is going to be having what sounds to be a kyphoplasty type procedure. With that being said that has not been scheduled yet there is still waiting on an MRI the patient is very busy in fact overly busy trying to help take care of his wife at this point. I completely understand this is more of a strain on him at this time 02/23/2019 upon evaluation today patient actually appears to be making some progress. I am actually very pleased with the overall appearance of his wounds even compared to last evaluation. He seems to be doing quite well. He is taking care of his wife unfortunately she did have a compression fracture she has had the procedure for this but still she has a slow road to recovery. For that reason he still not gone to [facility] for  a second opinion in this regard. Obviously the goal there was if there was anything that can be done from a skin graft standpoint or otherwise. 03/23/2019 upon evaluation today patient continues to have issues with lower extremity ulcers. Since the beginning he has made progress but at the same time the wounds unfortunately just will not close. We have been trying to get the patient to [facility] to see a specialist there but unfortunately with the everything going on with his wife he has not been able to make that appointment time yet he states he may be able to in the next 1-2 months but is not really sure. 04/27/2019 on evaluation today patient appears to be doing a little bit more poorly. His last evaluation. He appears to have some erythema around the edges of the wound at this point. Fortunately there is no signs of active infection at this time which is good news. No fevers, chills, nausea, vomiting, or diarrhea. 06/08/2019 on evaluation today patient appears to be doing well with regard to his wounds. Overall they are actually measuring smaller compared to the last visit last month. We did treat him for Pseudomonas as well as methicillin-resistant Staph aureus. He was only on the treatment for MRSA however for 7 days as the Cipro was resistant and subsequently we had to place him on doxycycline. Nonetheless I am thinking that we may want to add the doxycycline and just do a month-long treatment considering the longstanding nature of his wounds and see if we get this under better control. 07/13/2019 upon evaluation today patient appears to be doing fairly well in regard to his bilateral lower extremities. There does not appear to be any signs of active infection which is good news. No fevers, chills, nausea, vomiting, or diarrhea. 08/10/2019 upon evaluation today patient appears to be doing about the same with regard to his wounds in general. Unfortunately he is not  significantly better although is also not significantly worse which is great news there is no evidence of active infection at this time which is good news. He still dealing with a lot going on with his wife and therefore is not really able to Carlos see anyone at the specialty clinic at [facility] that we have previously set up still. 09/07/2019 on evaluation today patient appears to be doing well with regard to his wounds. In fact this is probably the best that have seen so far in quite a few months. Overall I am very pleased with where things stand  at this time. No fevers, chills, nausea, vomiting, or diarrhea. 10/05/2019 upon evaluation today patient appears to be doing more poorly in regard to his legs at this point. He actually is showing some signs of infection. This has been something that we seem to be back-and-forth with as far as trying to keep these wounds from becoming infected. He takes care of them very well in my opinion but nonetheless I am concerned in this regard. He tells me that his wife is still doing really about the same she is slowly getting better. Nonetheless he still spends the majority of his time helping to take care of her. 11/02/2019 upon evaluation today patient actually appears to be doing somewhat better in regard to his legs. I do believe that the compounded antibiotic treatment from [facility] has been beneficial for him. Overall I am extremely pleased with where things stand today. There is no signs of active infection at this time. The patient states he has much less drainage than he has in the past. 12/07/2019 upon evaluation today patient appears to be doing well at this time in regard to the overall appearance of his wound bed. Currently there is no signs of active infection at this time. With that being said he has been under a lot of stress some of the skin on the left upper portion of the wound is peeling away but again that is something that happens with the  epidermolysis bullosa. Especially when he stressed. Fortunately there is no signs of active infection locally or systemically at this point. 01/18/2020 on evaluation today patient appears to be doing well with regard to his leg ulcers. He has been tolerating the dressing changes without complication. Fortunately there is no signs of infection and overall I feel like his legs are doing about the best they have done in quite some time. There does not appear to be any evidence of active infection which is great news and overall very pleased. 02/29/2020 upon evaluation today patient's wounds actually appear to be doing quite well currently. There is no sign of active infection at this time. No fevers, chills, nausea, vomiting, or diarrhea. 04/11/2025 on evaluation today patient appears to be doing excellent in regard to his wounds on the legs to be honest. He has made a lot of progress there does not appear to be any signs of active infection and overall I think that he is better than previous although still the wounds are quite significant obviously. In general I think that he is pleased with where things stand at this point. Still there is quite a bit of work to do here. 07/13/2020 upon evaluation today patient appears to be doing well with regard to his wound. Is been tolerating the dressing changes without complication on both legs. He just has the 2 main wounds on each leg at the ankle on the medial region of the right leg is completely healed. His wounds do seem to have gotten better during the time that he has been on bedrest as result of the pelvis fracture. Obviously does not the way that we wanted to see things improved but nonetheless he tells me that it is what it is. Fortunately he does seem to be doing better he tells me that her daughter from the [place] is actually down helping to take care of them out as he and his wife while he is recovering. 08/08/2020 upon evaluation today patient appears to be  doing well currently in regard to his  leg ulcers. He has been tolerating the dressing changes without complication. Fortunately there is no signs of active infection at this time. No fevers, chills, nausea, vomiting, or diarrhea. 09/26/2020 upon evaluation today patient appears to be doing well with regard to his wound. He has been showing signs of good improvement which is great news and overall I am extremely pleased with where things stand today. I do think that he does well with his dressing changes and overall has really improved significantly here. segment how that how she doing 11/14/2020 upon evaluation today patient appears to be doing well with regard to his leg ulcers. He has been tolerating the dressing changes without complication. Fortunately there does not appear to be any signs of active infection at this time. No fevers, chills, nausea, vomiting, or diarrhea. 12/26/2020 upon evaluation today patient actually appears to be making some good progress here in regard to his wounds. Fortunately I do not see any signs of active infection which is great news. No fevers, chills, nausea, vomiting, or diarrhea. 02/06/2021 upon evaluation today patient appears to be doing well with regard to his wound currently. He has been tolerating the dressing changes without complication. Fortunately there does not appear to be any evidence of active infection locally nor systemically at this time. No fevers, chills, nausea, vomiting, or diarrhea. Overall I think that this is very slow but does seem to be getting a little bit better and a little smaller he has been through a lot of stress recently with his wife she is having a heart evaluation tomorrow to see whether or not an aortic valve replacement is something that she could tolerate and undergo. Objective Constitutional Well-nourished and well-hydrated in no acute distress. Vitals Time Taken: 12:40 PM, Height: 71 in, Weight: 220 lbs, BMI: 30.7,  Temperature: 98.1 F, Pulse: 76 bpm, Respiratory Rate: 20 breaths/min, Blood Pressure: 160/81 mmHg. Respiratory normal breathing without difficulty. Psychiatric this patient is able to make decisions and demonstrates good insight into disease process. Alert and Oriented x 3. pleasant and cooperative. General Notes: Upon inspection patient's wound bed actually showed signs of good improvement which is excellent news. Fortunately I do not see any signs of active infection locally nor systemically at this time which is great news. No fevers, chills, nausea, vomiting, or diarrhea. Integumentary (Hair, Skin) Wound #2 status is Open. Original cause of wound was Gradually Appeared. The date acquired was: 03/13/2017. The wound has been in treatment 175 weeks. The wound is located on the Left,Distal,Lateral Lower Leg. The wound measures 14.3cm length x 8.5cm width x 0.2cm depth; 95.465cm^2 area and 19.093cm^3 volume. There is Fat Layer (Subcutaneous Tissue) exposed. There is no tunneling or undermining noted. There is a large amount of serosanguineous drainage noted. The wound margin is distinct with the outline attached to the wound base. There is large (67-100%) red granulation within the wound bed. There is a small (1-33%) amount of necrotic tissue within the wound bed including Adherent Slough. Wound #5 status is Open. Original cause of wound was Gradually Appeared. The date acquired was: 10/26/2017. The wound has been in treatment 171 weeks. The wound is located on the Right,Lateral,Anterior Lower Leg. The wound measures 5.8cm length x 5cm width x 0.1cm depth; 22.777cm^2 area and 2.278cm^3 volume. There is Fat Layer (Subcutaneous Tissue) exposed. There is no tunneling or undermining noted. There is a medium amount of serosanguineous drainage noted. The wound margin is distinct with the outline attached to the wound base. There is large (67-100%)  red granulation within the wound bed. There is no necrotic  tissue within the wound bed. 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 - on days that dressing is changed Edema Control - Lymphedema / SCD / Other: Elevate legs to the level of the heart or above for 30 minutes daily and/or when sitting, a frequency of: - throughout the day Avoid standing for long periods of time. Exercise regularly Moisturize legs daily. Compression stocking or Garment 20-30 mm/Hg pressure to: - Juxtalite to both legs daily WOUND #2: - Lower Leg Wound Laterality: Left, Lateral, Distal Cleanser: Soap and Water 2 x Per Week/30 Days Discharge Instructions: May shower and wash wound with dial antibacterial soap and water prior to dressing change. Peri-Wound Care: Zinc Oxide Ointment 30g tube 2 x Per Week/30 Days Discharge Instructions: Apply Zinc Oxide to periwound with each dressing change as needed for maceration Peri-Wound Care: Sween Lotion (Moisturizing lotion) 2 x Per Week/30 Days Discharge Instructions: Apply moisturizing lotion as directed Prim Dressing: FIBRACOL Plus Dressing, 2x2 in (collagen) 2 x Per Week/30 Days ary Discharge Instructions: Moisten collagen with saline or hydrogel Secondary Dressing: ABD Pad, 5x9 (Generic) 2 x Per Week/30 Days Discharge Instructions: Apply over primary dressing as directed. Secondary Dressing: Xtrasorb Classic Super Absorbent Dressing, 6x9 (in/in) (Dispense As Written) 2 x Per Week/30 Days Discharge Instructions: Apply over primary  dressing as directed. Secured With: Elastic Bandage 4 inch (ACE bandage) 2 x Per Week/30 Days Discharge Instructions: Secure with ACE bandage as directed. Secured With: [employer], 4.5x3.1 (in/yd) (Generic) 2 x Per Week/30 Days Discharge Instructions: Secure with Kerlix as directed. WOUND #5: - Lower Leg Wound Laterality: Right, Lateral, Anterior Cleanser: Soap and Water 2 x Per Week/30 Days Discharge Instructions: May shower and wash wound with dial antibacterial soap and water prior to dressing change. Peri-Wound Care: Zinc Oxide Ointment 30g tube 2 x Per Week/30 Days Discharge Instructions: Apply Zinc Oxide to periwound with each dressing change as needed for maceration Peri-Wound Care: Sween Lotion (Moisturizing lotion) 2 x Per Week/30 Days Discharge Instructions: Apply moisturizing lotion as directed Prim Dressing: FIBRACOL Plus Dressing, 2x2 in (collagen) 2 x Per Week/30 Days ary Discharge Instructions: Moisten collagen with saline or hydrogel Secondary Dressing: ABD Pad, 5x9 (Generic) 2 x Per Week/30 Days Discharge Instructions: Apply over primary dressing as directed. Secondary Dressing: Xtrasorb Classic Super Absorbent Dressing, 6x9 (in/in) (Generic) 2 x Per Week/30 Days Discharge Instructions: Apply over primary dressing as directed. Secured With: Elastic Bandage 4 inch (ACE bandage) 2 x Per Week/30 Days Discharge Instructions: Secure with ACE bandage as directed. Secured With: [employer], 4.5x3.1 (in/yd) (Generic) 2 x Per Week/30 Days Discharge Instructions: Secure with Kerlix as directed. 1. I would recommend currently that we going continue with the wound care measures as before we will get a Carlos back to the Fibracol which I think has done well for her in the past. 2. I am also can recommend that we have the patient Carlos ahead and continue with the compression as he has been utilizing up to this point. Following there for  recall he is good to be using the ABD  pads and XtraSorb. He also will continue with the roll gauze and Ace wrap to secure in place and he is then using his juxta lite compression wraps over top of this. Going forward he may need to consider ordering him a new compression wraps of the Velcro kind in order to help with keeping this edema under control. We will see how things are doing next Carlos around. We will see patient back for reevaluation in 6 weeks here in the clinic. If anything worsens or changes patient will contact our office for additional recommendations. Electronic Signature(s) Signed: 02/06/2021 1:09:15 PM By: Worthy Keeler PA-C Entered By: Worthy Keeler on 02/06/2021 13:09:15 -------------------------------------------------------------------------------- SuperBill Details Patient Name: Date of Service: Carlos White 02/06/2021 Medical Record Number: [id number] Patient Account Number: [id number] Date of Birth/Sex: Treating RN: 1940-01-18 (81 y.o. [name] Primary Care Provider: [name] Other Clinician: Referring Provider: Treating Provider/Extender: [name] in Treatment: 175 Diagnosis Coding ICD-10 Codes Code Description I87.2 Venous insufficiency (chronic) (peripheral) Q81.8 Other epidermolysis bullosa L97.322 Non-pressure chronic ulcer of left ankle with fat layer exposed L97.822 Non-pressure chronic ulcer of other part of left lower leg with fat layer exposed L97.812 Non-pressure chronic ulcer of other part of right lower leg with fat layer exposed [place] (primary) hypertension Facility Procedures CPT4 Code: [id number] Description: 47829 - WOUND CARE VISIT-LEV 5 EST PT Modifier: Quantity: 1 Physician Procedures : CPT4 Code Description Modifier [id number] - WC PHYS LEVEL 4 - EST PT ICD-10 Diagnosis Description I87.2 Venous insufficiency (chronic) (peripheral) Q81.8 Other epidermolysis bullosa L97.322 Non-pressure chronic ulcer of left ankle with fat  layer  exposed L97.822 Non-pressure chronic ulcer of other part of left lower leg with fat layer exposed Quantity: 1 Electronic Signature(s) Signed: 02/06/2021 1:09:49 PM By: Worthy Keeler PA-C Entered By: Worthy Keeler on 02/06/2021 13:09:49

## 2021-03-20 ENCOUNTER — Encounter (HOSPITAL_BASED_OUTPATIENT_CLINIC_OR_DEPARTMENT_OTHER): Payer: Medicare PPO | Admitting: Physician Assistant

## 2021-03-28 ENCOUNTER — Other Ambulatory Visit: Payer: Self-pay | Admitting: Internal Medicine

## 2021-04-03 ENCOUNTER — Encounter (HOSPITAL_BASED_OUTPATIENT_CLINIC_OR_DEPARTMENT_OTHER): Payer: Medicare PPO | Attending: Physician Assistant | Admitting: Physician Assistant

## 2021-04-03 ENCOUNTER — Other Ambulatory Visit: Payer: Self-pay

## 2021-04-03 DIAGNOSIS — Q819 Epidermolysis bullosa, unspecified: Secondary | ICD-10-CM | POA: Diagnosis not present

## 2021-04-03 DIAGNOSIS — L97322 Non-pressure chronic ulcer of left ankle with fat layer exposed: Secondary | ICD-10-CM | POA: Insufficient documentation

## 2021-04-03 DIAGNOSIS — L97812 Non-pressure chronic ulcer of other part of right lower leg with fat layer exposed: Secondary | ICD-10-CM | POA: Diagnosis not present

## 2021-04-03 DIAGNOSIS — I10 Essential (primary) hypertension: Secondary | ICD-10-CM | POA: Insufficient documentation

## 2021-04-03 DIAGNOSIS — I872 Venous insufficiency (chronic) (peripheral): Secondary | ICD-10-CM | POA: Diagnosis not present

## 2021-04-03 DIAGNOSIS — L97822 Non-pressure chronic ulcer of other part of left lower leg with fat layer exposed: Secondary | ICD-10-CM | POA: Diagnosis not present

## 2021-04-03 NOTE — Progress Notes (Addendum)
RAZA, BAYLESS ([id number]) ?Visit Report for 04/03/2021 ?Chief Complaint Document Details ?Patient Name: Date of Service: ?Carlos White 04/03/2021 12:45 PM ?Medical Record Number: [id number] ?Patient Account Number: [id number] ?Date of Birth/Sex: Treating RN: ?04-Jun-1940 (81 y.o. M) Rolin Barry, Bobbi ?Primary Care Provider: [name] Other Clinician: ?Referring Provider: ?Treating Provider/Extender: Worthy Keeler ?[name] ?Weeks in Treatment: 183 ?Information Obtained from: Patient ?Chief Complaint ?Bilateral LE Ulcers ?Electronic Signature(s) ?Signed: 04/03/2021 12:39:43 PM By: Worthy Keeler PA-C ?Entered By: Worthy Keeler on 04/03/2021 12:39:43 ?-------------------------------------------------------------------------------- ?HPI Details ?Patient Name: Date of Service: ?Carlos White 04/03/2021 12:45 PM ?Medical Record Number: [id number] ?Patient Account Number: [id number] ?Date of Birth/Sex: Treating RN: ?November 23, 1940 (81 y.o. M) Rolin Barry, Bobbi ?Primary Care Provider: [name] Other Clinician: ?Referring Provider: ?Treating Provider/Extender: Worthy Keeler ?[name] ?Weeks in Treatment: 183 ?History of Present Illness ?HPI Description: 09/30/17 on evaluation today patient presents for initial evaluation and our clinic concerning issues that he has been having with his bilateral ?lower extremities. He states this has been going on for quite some time at least six months. Currently his regiment has been mainly cleaning the area with ?peroxide, applying the is foreign ointment, and wrapping the area with ABD pads and then an ace wrap loosely. He has dealt with issues of this nature he tells ?me for quite some time. He does have a history of having had a compound fracture of the left lower extremity which he thinks also makes this a much more ?difficult area for him to heal. He's previously been told that he had poor vascular flow but this was years ago at [facility] we do not have any of those  records ?at this time. He has a history of Epidermolysis Bullosa which was diagnosed around age 28 and he has been cared for at [facility] since that time. Subsequently he ?states this is hereditary and two of his children one male and one male also have this as well is one of his grandchildren that he is aware of. He has no ?evidence of infection necessarily at this point although he does have some necrotic tissue noted on the surface of the wound as far as the largest, left lateral ?lower extremity ulcer, is concerned. Overall I feel like all things considered he's been taking care of this very well. Obviously he has some fairly significant ?issues going on at this point in this regard. He does have a history otherwise of hypertension though for the most part other than the compound fracture of the ?left leg he seems to have been fairly healthy in my pinion. ?10/07/17 on evaluation today patient actually appears to be doing better in regard to his bilateral lower extremity ulcers. With that being said he does still have ?some evidence of slough noted on the surface of the wounds I think the Iodoflex has been beneficial for him. His arterial studies are scheduled for October 2. ?With that being said I do believe that he is continuing to show signs of good improvement which is at least good news. ?10/14/17 on evaluation today patient appears to be doing very well in regard to his lower extremity ulcers. He's definitely made some progress as far as healing ?is concerned although there still are several open areas that are going to need to be addressed. He did have his arterial study today which fortunately shows ?good findings with a right ABI of 1.23 with a TBI of 0.86 in the left ABI  of 1.28 with a TBI of 0.81. This is good news and will allow [place] to perform debridement as ?well. ?10/23/2017; patient with a large wound on the left lateral calf, sizable area on the left medial malleolus and an area on the right lateral  malleolus. He has a new ?blister consistent with his underlying blistering skin disease just above this area we have been using Iodoflex on the lateral left calf lateral right ankle and ?collagen on the medial left ankle. We have been using Kerlix Coban wraps ?10/28/17 on evaluation today the patient continues to have signs of improvement in regard to the overall appearance of the original wound. Unfortunately he ?did have some blistering over the right lateral lower extremity which has appeared to rupture on evaluation today and likely some of the dead tissue on the ?surface needs to be cleaned away the good news is this does not appear to be to significantly deep at this time. ?11/04/17 on evaluation today patient actually appears to be doing a little better in regard to his lower extremity ulcers. He has been tolerating the dressing ?changes without complication. With that being said he does still have a significant wound especially over the left lateral lower extremity unfortunately. All of the ?wounds pretty much are going to require sharp debridement today. ?11/11/17 on evaluation today patient appears to be doing more poorly in regard to his left lower extremity in particular. There does not appear to be any ?evidence of systemic infection although the wound itself as far as the larger left lateral lower extremity ulcer actually appears to be infected in my pinion. ?There's an older and the surface of the wound is dramatically worsened compared to last week. No fevers, chills, nausea, or vomiting noted at this time. ?11/18/17 upon evaluation today patient actually appears to be doing better. I did review his culture today which really did not show any specific organism is a ?positive reason for his wound decline. There are multiple organisms present not predominant. Nonetheless he seems to be tolerate the doxycycline well in his ?wounds in general do seem to be doing better. Fortunately there does not appear  to be any evidence of infection at this time which is good news. Overall I'm ?very pleased with how things appear. Nonetheless he still has a lot of healing to go. I do think he could benefit from a Juxta-Lite wrap. ?11/25/17 on evaluation today patient actually appears to be doing fairly well in regard to his wounds. He is still taking the antibiotics he has a few days left. ?Fortunately this seems to have been excellent for him as far as getting the infection control and very happy in this regard. With that being said the patient ?likewise is also very pleased with how things appear at this time in comparison to where we were he's not having as much pain. ?12/02/17 Seen today for follow p and management of LLE wounds. Wounds appear to show some improvement. He denies pain, fever, or chills. Completed a ?course of doxycycline earlier this month. Scheduled to received Juxta-Lite wrap this week. No s/s of infections. ?12/09/17 on evaluation today patient actually appears to be doing a little bit better in regard to his wounds. This is obscene very slow process and unfortunately ?he has a couple of new areas and this is due to the Epidermolysis Bullosa. Nonetheless I am concerned about the fact that he seems to be getting more areas ?not less that is the reason we're gonna work on  getting schedule for the vascular referral to see the venous specialist. ?12/23/17 upon evaluation today patient's wounds currently shows evidence of still not doing quite as well is what I would like to have seen. Subsequently the ?patient did have his venous study which showed evidence of venous stasis. Subsequently I do think that a vascular evaluation for consideration of venous ?intervention would be appropriate. I'm not necessarily suggesting that will be anything that can be done but I think it is at least a good idea. He is in agreement ?with this plan. ?12/30/17 on evaluation today patient actually appears to be doing very well in  regard to his wounds when compared to previous evaluation. Subsequently we ?have been using the [facility] Dressing which actually appears to have done excellent on his left lateral lower extremity ulcer. The

## 2021-04-03 NOTE — Progress Notes (Signed)
DEVERICK, PRUSS ([id number]) ?Visit Report for 04/03/2021 ?Arrival Information Details ?Patient Name: Date of Service: ?Carlos White 04/03/2021 12:45 PM ?Medical Record Number: [id number] ?Patient Account Number: [id number] ?Date of Birth/Sex: Treating RN: ?February 08, 1940 (81 y.o. M) Rolin Barry, Bobbi ?Primary Care Pura Picinich: [name] Other Clinician: ?Referring Amaris Delafuente: ?Treating Corri Delapaz/Extender: Worthy Keeler ?[name] ?Weeks in Treatment: 183 ?Visit Information History Since Last Visit ?Added or deleted any medications: No ?Patient Arrived: Ambulatory ?Any new allergies or adverse reactions: No ?Arrival Time: 12:44 ?Had a fall or experienced change in No ?Accompanied By: self ?activities of daily living that may affect ?Transfer Assistance: None ?risk of falls: ?Patient Identification Verified: Yes ?Signs or symptoms of abuse/neglect since last visito No ?Secondary Verification Process Completed: Yes ?Hospitalized since last visit: No ?Patient Requires Transmission-Based Precautions: No ?Implantable device outside of the clinic excluding No ?Patient Has Alerts: Yes ?cellular tissue based products placed in the center ?Patient Alerts: R ABI= 1.23, TBI = .86 since last visit: ?L ABI= 1.28, TBI=.81 Has Dressing in Place as Prescribed: Yes ?Has Compression in Place as Prescribed: Yes ?Pain Present Now: No ?Electronic Signature(s) ?Signed: 04/03/2021 5:40:22 PM By: [name] RN, BSN ?Entered By: [name] on 04/03/2021 12:44:37 ?-------------------------------------------------------------------------------- ?Clinic Level of Care Assessment Details ?Patient Name: Date of Service: ?Carlos White 04/03/2021 12:45 PM ?Medical Record Number: [id number] ?Patient Account Number: [id number] ?Date of Birth/Sex: Treating RN: ?July 17, 1940 (81 y.o. M) Rolin Barry, Bobbi ?Primary Care Keawe Marcello: [name] Other Clinician: ?Referring Quintyn Dombek: ?Treating Ashtan Girtman/Extender: Worthy Keeler ?[name] ?Weeks in Treatment:  183 ?Clinic Level of Care Assessment Items ?TOOL 4 Quantity Score ?X- 1 0 ?Use when only an EandM is performed on FOLLOW-UP visit ?ASSESSMENTS - Nursing Assessment / Reassessment ?X- 1 10 ?Reassessment of Co-morbidities (includes updates in patient status) ?X- 1 5 ?Reassessment of Adherence to Treatment Plan ?ASSESSMENTS - Wound and Skin A ssessment / Reassessment ?'[]'$  - 0 ?Simple Wound Assessment / Reassessment - one wound ?X- 2 5 ?Complex Wound Assessment / Reassessment - multiple wounds ?X- 1 10 ?Dermatologic / Skin Assessment (not related to wound area) ?ASSESSMENTS - Focused Assessment ?X- 2 5 ?Circumferential Edema Measurements - multi extremities ?'[]'$  - 0 ?Nutritional Assessment / Counseling / Intervention ?'[]'$  - 0 ?Lower Extremity Assessment (monofilament, tuning fork, pulses) ?'[]'$  - 0 ?Peripheral Arterial Disease Assessment (using hand held doppler) ?ASSESSMENTS - Ostomy and/or Continence Assessment and Care ?'[]'$  - 0 ?Incontinence Assessment and Management ?'[]'$  - 0 ?Ostomy Care Assessment and Management (repouching, etc.) ?PROCESS - Coordination of Care ?'[]'$  - 0 ?Simple Patient / Family Education for ongoing care ?X- 1 20 ?Complex (extensive) Patient / Family Education for ongoing care ?X- 1 10 ?Staff obtains Consents, Records, T Results / Process Orders ?est ?'[]'$  - 0 ?Staff telephones HHA, Nursing Homes / Clarify orders / etc ?'[]'$  - 0 ?Routine Transfer to another Facility (non-emergent condition) ?'[]'$  - 0 ?Routine Hospital Admission (non-emergent condition) ?'[]'$  - 0 ?New Admissions / Biomedical engineer / Ordering NPWT Apligraf, etc. ?, ?'[]'$  - 0 ?Emergency Hospital Admission (emergent condition) ?'[]'$  - 0 ?Simple Discharge Coordination ?X- 1 15 ?Complex (extensive) Discharge Coordination ?PROCESS - Special Needs ?'[]'$  - 0 ?Pediatric / Minor Patient Management ?'[]'$  - 0 ?Isolation Patient Management ?'[]'$  - 0 ?Hearing / Language / Visual special needs ?'[]'$  - 0 ?Assessment of Community assistance (transportation, D/C  planning, etc.) ?'[]'$  - 0 ?Additional assistance / Altered mentation ?'[]'$  - 0 ?Support Surface(s) Assessment (bed, cushion, seat, etc.) ?INTERVENTIONS -  Wound Cleansing / Measurement ?'[]'$  - 0 ?Simple Wound Cleansing - one wound ?X- 2 5 ?Complex Wound Cleansing - multiple wounds ?X- 1 5 ?Wound Imaging (photographs - any number of wounds) ?'[]'$  - 0 ?Wound Tracing (instead of photographs) ?'[]'$  - 0 ?Simple Wound Measurement - one wound ?X- 2 5 ?Complex Wound Measurement - multiple wounds ?INTERVENTIONS - Wound Dressings ?'[]'$  - 0 ?Small Wound Dressing one or multiple wounds ?X- 2 15 ?Medium Wound Dressing one or multiple wounds ?'[]'$  - 0 ?Large Wound Dressing one or multiple wounds ?'[]'$  - 0 ?Application of Medications - topical ?'[]'$  - 0 ?Application of Medications - injection ?INTERVENTIONS - Miscellaneous ?'[]'$  - 0 ?External ear exam ?'[]'$  - 0 ?Specimen Collection (cultures, biopsies, blood, body fluids, etc.) ?'[]'$  - 0 ?Specimen(s) / Culture(s) sent or taken to Lab for analysis ?'[]'$  - 0 ?Patient Transfer (multiple staff / Civil Service fast streamer / Similar devices) ?'[]'$  - 0 ?Simple Staple / Suture removal (25 or less) ?'[]'$  - 0 ?Complex Staple / Suture removal (26 or more) ?'[]'$  - 0 ?Hypo / Hyperglycemic Management (close monitor of Blood Glucose) ?'[]'$  - 0 ?Ankle / Brachial Index (ABI) - do not check if billed separately ?X- 1 5 ?Vital Signs ?Has the patient been seen at the hospital within the last three years: Yes ?Total Score: 150 ?Level Of Care: New/Established - Level 4 ?Electronic Signature(s) ?Signed: 04/03/2021 5:40:22 PM By: [name] RN, BSN ?Entered By: [name] on 04/03/2021 13:06:10 ?-------------------------------------------------------------------------------- ?Encounter Discharge Information Details ?Patient Name: Date of Service: ?Carlos White 04/03/2021 12:45 PM ?Medical Record Number: [id number] ?Patient Account Number: [id number] ?Date of Birth/Sex: Treating RN: ?[birth year] (81 y.o. M) Rolin Barry, Bobbi ?Primary Care Zianne Schubring:  [name] Other Clinician: ?Referring Jolie Strohecker: ?Treating Remee Charley/Extender: Worthy Keeler ?[name] ?Weeks in Treatment: 183 ?Encounter Discharge Information Items ?Discharge Condition: Stable ?Ambulatory Status: Ambulatory ?Discharge Destination: Home ?Transportation: Other ?Accompanied By: self ?Schedule Follow-up Appointment: Yes ?Clinical Summary of Care: ?Electronic Signature(s) ?Signed: 04/03/2021 5:40:22 PM By: [name] RN, BSN ?Entered By: [name] on 04/03/2021 13:06:56 ?-------------------------------------------------------------------------------- ?Lower Extremity Assessment Details ?Patient Name: Date of Service: ?Carlos White 04/03/2021 12:45 PM ?Medical Record Number: [id number] ?Patient Account Number: [id number] ?Date of Birth/Sex: Treating RN: ?03-Apr-1940 (81 y.o. M) Rolin Barry, Bobbi ?Primary Care Almira Phetteplace: [name] Other Clinician: ?Referring Maliek Schellhorn: ?Treating Royston Bekele/Extender: Worthy Keeler ?[name] ?Weeks in Treatment: 183 ?Edema Assessment ?Assessed: [Left: Yes] [Right: Yes] ?Edema: [Left: Yes] [Right: Yes] ?Calf ?Left: Right: ?Point of Measurement: 35.5 cm From Medial Instep 35 cm 35 cm ?Ankle ?Left: Right: ?Point of Measurement: 11 cm From Medial Instep 25 cm 23 cm ?Vascular Assessment ?Pulses: ?Dorsalis Pedis ?Palpable: [Left:Yes] [Right:Yes] ?Electronic Signature(s) ?Signed: 04/03/2021 5:40:22 PM By: [name] RN, BSN ?Entered By: [name] on 04/03/2021 12:50:32 ?-------------------------------------------------------------------------------- ?Multi-Disciplinary Care Plan Details ?Patient Name: ?Date of Service: ?Carlos White 04/03/2021 12:45 PM ?Medical Record Number: [id number] ?Patient Account Number: [id number] ?Date of Birth/Sex: ?Treating RN: ?12-15-1940 (81 y.o. M) Rolin Barry, Bobbi ?Primary Care Marceline Napierala: [name] ?Other Clinician: ?Referring Rahiem Schellinger: ?Treating Emmery Seiler/Extender: Worthy Keeler ?[name] ?Weeks in Treatment:  183 ?Multidisciplinary Care Plan reviewed with physician ?Active Inactive ?Venous Leg Ulcer ?Nursing Diagnoses: ?Knowledge deficit related to disease process and management ?Potential for venous Insuffiency (use before

## 2021-05-15 ENCOUNTER — Encounter (HOSPITAL_BASED_OUTPATIENT_CLINIC_OR_DEPARTMENT_OTHER): Payer: Medicare PPO | Attending: Physician Assistant | Admitting: Physician Assistant

## 2021-05-15 DIAGNOSIS — L97322 Non-pressure chronic ulcer of left ankle with fat layer exposed: Secondary | ICD-10-CM | POA: Diagnosis not present

## 2021-05-15 DIAGNOSIS — I10 Essential (primary) hypertension: Secondary | ICD-10-CM | POA: Insufficient documentation

## 2021-05-15 DIAGNOSIS — L97822 Non-pressure chronic ulcer of other part of left lower leg with fat layer exposed: Secondary | ICD-10-CM | POA: Insufficient documentation

## 2021-05-15 DIAGNOSIS — Q818 Other epidermolysis bullosa: Secondary | ICD-10-CM | POA: Insufficient documentation

## 2021-05-15 DIAGNOSIS — I872 Venous insufficiency (chronic) (peripheral): Secondary | ICD-10-CM | POA: Diagnosis not present

## 2021-05-15 DIAGNOSIS — L97812 Non-pressure chronic ulcer of other part of right lower leg with fat layer exposed: Secondary | ICD-10-CM | POA: Insufficient documentation

## 2021-05-15 NOTE — Progress Notes (Addendum)
KHAM, ZUCKERMAN ([id number]) ?Visit Report for 05/15/2021 ?Chief Complaint Document Details ?Patient Name: Date of Service: ?Carlos White 05/15/2021 12:45 PM ?Medical Record Number: [id number] ?Patient Account Number: [id number] ?Date of Birth/Sex: Treating RN: ?Feb 26, 1940 (81 y.o. M) Rolin Barry, Bobbi ?Primary Care Provider: [name] Other Clinician: ?Referring Provider: ?Treating Provider/Extender: Worthy Keeler ?[name] ?Weeks in Treatment: 189 ?Information Obtained from: Patient ?Chief Complaint ?Bilateral LE Ulcers ?Electronic Signature(s) ?Signed: 05/15/2021 1:02:41 PM By: Worthy Keeler PA-C ?Entered By: Worthy Keeler on 05/15/2021 13:02:40 ?-------------------------------------------------------------------------------- ?HPI Details ?Patient Name: Date of Service: ?Carlos White 05/15/2021 12:45 PM ?Medical Record Number: [id number] ?Patient Account Number: [id number] ?Date of Birth/Sex: Treating RN: ?04-10-1940 (81 y.o. M) Rolin Barry, Bobbi ?Primary Care Provider: [name] Other Clinician: ?Referring Provider: ?Treating Provider/Extender: Worthy Keeler ?[name] ?Weeks in Treatment: 189 ?History of Present Illness ?HPI Description: 09/30/17 on evaluation today patient presents for initial evaluation and our clinic concerning issues that he has been having with his bilateral ?lower extremities. He states this has been going on for quite some time at least six months. Currently his regiment has been mainly cleaning the area with ?peroxide, applying the is foreign ointment, and wrapping the area with ABD pads and then an ace wrap loosely. He has dealt with issues of this nature he tells ?me for quite some time. He does have a history of having had a compound fracture of the left lower extremity which he thinks also makes this a much more ?difficult area for him to heal. He's previously been told that he had poor vascular flow but this was years ago at [facility] we do not have any of those records ?at  this time. He has a history of Epidermolysis Bullosa which was diagnosed around age 52 and he has been cared for at [facility] since that time. Subsequently he ?states this is hereditary and two of his children one male and one male also have this as well is one of his grandchildren that he is aware of. He has no ?evidence of infection necessarily at this point although he does have some necrotic tissue noted on the surface of the wound as far as the largest, left lateral ?lower extremity ulcer, is concerned. Overall I feel like all things considered he's been taking care of this very well. Obviously he has some fairly significant ?issues going on at this point in this regard. He does have a history otherwise of hypertension though for the most part other than the compound fracture of the ?left leg he seems to have been fairly healthy in my pinion. ?10/07/17 on evaluation today patient actually appears to be doing better in regard to his bilateral lower extremity ulcers. With that being said he does still have ?some evidence of slough noted on the surface of the wounds I think the Iodoflex has been beneficial for him. His arterial studies are scheduled for October 2. ?With that being said I do believe that he is continuing to show signs of good improvement which is at least good news. ?10/14/17 on evaluation today patient appears to be doing very well in regard to his lower extremity ulcers. He's definitely made some progress as far as healing ?is concerned although there still are several open areas that are going to need to be addressed. He did have his arterial study today which fortunately shows ?good findings with a right ABI of 1.23 with a TBI of 0.86 in the left ABI  of 1.28 with a TBI of 0.81. This is good news and will allow [place] to perform debridement as ?well. ?10/23/2017; patient with a large wound on the left lateral calf, sizable area on the left medial malleolus and an area on the right lateral malleolus. He  has a new ?blister consistent with his underlying blistering skin disease just above this area we have been using Iodoflex on the lateral left calf lateral right ankle and ?collagen on the medial left ankle. We have been using Kerlix Coban wraps ?10/28/17 on evaluation today the patient continues to have signs of improvement in regard to the overall appearance of the original wound. Unfortunately he ?did have some blistering over the right lateral lower extremity which has appeared to rupture on evaluation today and likely some of the dead tissue on the ?surface needs to be cleaned away the good news is this does not appear to be to significantly deep at this time. ?11/04/17 on evaluation today patient actually appears to be doing a little better in regard to his lower extremity ulcers. He has been tolerating the dressing ?changes without complication. With that being said he does still have a significant wound especially over the left lateral lower extremity unfortunately. All of the ?wounds pretty much are going to require sharp debridement today. ?11/11/17 on evaluation today patient appears to be doing more poorly in regard to his left lower extremity in particular. There does not appear to be any ?evidence of systemic infection although the wound itself as far as the larger left lateral lower extremity ulcer actually appears to be infected in my pinion. ?There's an older and the surface of the wound is dramatically worsened compared to last week. No fevers, chills, nausea, or vomiting noted at this time. ?11/18/17 upon evaluation today patient actually appears to be doing better. I did review his culture today which really did not show any specific organism is a ?positive reason for his wound decline. There are multiple organisms present not predominant. Nonetheless he seems to be tolerate the doxycycline well in his ?wounds in general do seem to be doing better. Fortunately there does not appear to be any  evidence of infection at this time which is good news. Overall I'm ?very pleased with how things appear. Nonetheless he still has a lot of healing to go. I do think he could benefit from a Juxta-Lite wrap. ?11/25/17 on evaluation today patient actually appears to be doing fairly well in regard to his wounds. He is still taking the antibiotics he has a few days left. ?Fortunately this seems to have been excellent for him as far as getting the infection control and very happy in this regard. With that being said the patient ?likewise is also very pleased with how things appear at this time in comparison to where we were he's not having as much pain. ?12/02/17 Seen today for follow p and management of LLE wounds. Wounds appear to show some improvement. He denies pain, fever, or chills. Completed a ?course of doxycycline earlier this month. Scheduled to received Juxta-Lite wrap this week. No s/s of infections. ?12/09/17 on evaluation today patient actually appears to be doing a little bit better in regard to his wounds. This is obscene very slow process and unfortunately ?he has a couple of new areas and this is due to the Epidermolysis Bullosa. Nonetheless I am concerned about the fact that he seems to be getting more areas ?not less that is the reason we're gonna work on  getting schedule for the vascular referral to see the venous specialist. ?12/23/17 upon evaluation today patient's wounds currently shows evidence of still not doing quite as well is what I would like to have seen. Subsequently the ?patient did have his venous study which showed evidence of venous stasis. Subsequently I do think that a vascular evaluation for consideration of venous ?intervention would be appropriate. I'm not necessarily suggesting that will be anything that can be done but I think it is at least a good idea. He is in agreement ?with this plan. ?12/30/17 on evaluation today patient actually appears to be doing very well in regard to  his wounds when compared to previous evaluation. Subsequently we ?have been using the [facility] Dressing which actually appears to have done excellent on his left lateral lower extremity ulcer. The qual

## 2021-05-21 NOTE — Progress Notes (Signed)
DARRIOUS, YOUMAN ([id number]) ?Visit Report for 05/15/2021 ?Arrival Information Details ?Patient Name: Date of Service: ?Carlos White 05/15/2021 12:45 PM ?Medical Record Number: [id number] ?Patient Account Number: [id number] ?Date of Birth/Sex: Treating RN: ?10-Jan-1941 (81 y.o. Mare Ferrari ?Primary Care Obe Ahlers: [name] Other Clinician: ?Referring Verlyn Dannenberg: ?Treating Cooper Moroney/Extender: Worthy Keeler ?[name] ?Weeks in Treatment: 189 ?Visit Information History Since Last Visit ?Added or deleted any medications: No ?Patient Arrived: Ambulatory ?Any new allergies or adverse reactions: No ?Arrival Time: 12:53 ?Had a fall or experienced change in No ?Accompanied By: self ?activities of daily living that may affect ?Transfer Assistance: None ?risk of falls: ?Patient Identification Verified: Yes ?Signs or symptoms of abuse/neglect since last visito No ?Secondary Verification Process Completed: Yes ?Hospitalized since last visit: No ?Patient Requires Transmission-Based Precautions: No ?Implantable device outside of the clinic excluding No ?Patient Has Alerts: Yes ?cellular tissue based products placed in the center ?Patient Alerts: R ABI= 1.23, TBI = .86 since last visit: ?L ABI= 1.28, TBI=.81 Has Dressing in Place as Prescribed: Yes ?Has Compression in Place as Prescribed: Yes ?Pain Present Now: No ?Electronic Signature(s) ?Signed: 05/21/2021 8:24:25 AM By: Sharyn Creamer RN, BSN ?Entered By: Sharyn Creamer on 05/15/2021 12:54:17 ?-------------------------------------------------------------------------------- ?Clinic Level of Care Assessment Details ?Patient Name: Date of Service: ?Carlos White 05/15/2021 12:45 PM ?Medical Record Number: [id number] ?Patient Account Number: [id number] ?Date of Birth/Sex: Treating RN: ?1940/09/18 (81 y.o. Mare Ferrari ?Primary Care Anu Stagner: [name] Other Clinician: ?Referring Eusevio Schriver: ?Treating Abiageal Blowe/Extender: Worthy Keeler ?[name] ?Weeks in Treatment:  189 ?Clinic Level of Care Assessment Items ?TOOL 4 Quantity Score ?X- 1 0 ?Use when only an EandM is performed on FOLLOW-UP visit ?ASSESSMENTS - Nursing Assessment / Reassessment ?X- 1 10 ?Reassessment of Co-morbidities (includes updates in patient status) ?X- 1 5 ?Reassessment of Adherence to Treatment Plan ?ASSESSMENTS - Wound and Skin A ssessment / Reassessment ?'[]'$  - 0 ?Simple Wound Assessment / Reassessment - one wound ?X- 1 5 ?Complex Wound Assessment / Reassessment - multiple wounds ?'[]'$  - 0 ?Dermatologic / Skin Assessment (not related to wound area) ?ASSESSMENTS - Focused Assessment ?X- 1 5 ?Circumferential Edema Measurements - multi extremities ?'[]'$  - 0 ?Nutritional Assessment / Counseling / Intervention ?'[]'$  - 0 ?Lower Extremity Assessment (monofilament, tuning fork, pulses) ?'[]'$  - 0 ?Peripheral Arterial Disease Assessment (using hand held doppler) ?ASSESSMENTS - Ostomy and/or Continence Assessment and Care ?'[]'$  - 0 ?Incontinence Assessment and Management ?'[]'$  - 0 ?Ostomy Care Assessment and Management (repouching, etc.) ?PROCESS - Coordination of Care ?'[]'$  - 0 ?Simple Patient / Family Education for ongoing care ?X- 1 20 ?Complex (extensive) Patient / Family Education for ongoing care ?X- 1 10 ?Staff obtains Consents, Records, T Results / Process Orders ?est ?'[]'$  - 0 ?Staff telephones HHA, Nursing Homes / Clarify orders / etc ?'[]'$  - 0 ?Routine Transfer to another Facility (non-emergent condition) ?'[]'$  - 0 ?Routine Hospital Admission (non-emergent condition) ?'[]'$  - 0 ?New Admissions / Biomedical engineer / Ordering NPWT Apligraf, etc. ?, ?'[]'$  - 0 ?Emergency Hospital Admission (emergent condition) ?'[]'$  - 0 ?Simple Discharge Coordination ?'[]'$  - 0 ?Complex (extensive) Discharge Coordination ?PROCESS - Special Needs ?'[]'$  - 0 ?Pediatric / Minor Patient Management ?'[]'$  - 0 ?Isolation Patient Management ?'[]'$  - 0 ?Hearing / Language / Visual special needs ?'[]'$  - 0 ?Assessment of Community assistance (transportation, D/C  planning, etc.) ?'[]'$  - 0 ?Additional assistance / Altered mentation ?'[]'$  - 0 ?Support Surface(s) Assessment (bed, cushion, seat, etc.) ?INTERVENTIONS -  Wound Cleansing / Measurement ?'[]'$  - 0 ?Simple Wound Cleansing - one wound ?X- 2 5 ?Complex Wound Cleansing - multiple wounds ?X- 1 5 ?Wound Imaging (photographs - any number of wounds) ?'[]'$  - 0 ?Wound Tracing (instead of photographs) ?'[]'$  - 0 ?Simple Wound Measurement - one wound ?X- 2 5 ?Complex Wound Measurement - multiple wounds ?INTERVENTIONS - Wound Dressings ?'[]'$  - 0 ?Small Wound Dressing one or multiple wounds ?X- 2 15 ?Medium Wound Dressing one or multiple wounds ?'[]'$  - 0 ?Large Wound Dressing one or multiple wounds ?'[]'$  - 0 ?Application of Medications - topical ?'[]'$  - 0 ?Application of Medications - injection ?INTERVENTIONS - Miscellaneous ?'[]'$  - 0 ?External ear exam ?'[]'$  - 0 ?Specimen Collection (cultures, biopsies, blood, body fluids, etc.) ?'[]'$  - 0 ?Specimen(s) / Culture(s) sent or taken to Lab for analysis ?'[]'$  - 0 ?Patient Transfer (multiple staff / Civil Service fast streamer / Similar devices) ?'[]'$  - 0 ?Simple Staple / Suture removal (25 or less) ?'[]'$  - 0 ?Complex Staple / Suture removal (26 or more) ?'[]'$  - 0 ?Hypo / Hyperglycemic Management (close monitor of Blood Glucose) ?'[]'$  - 0 ?Ankle / Brachial Index (ABI) - do not check if billed separately ?X- 1 5 ?Vital Signs ?Has the patient been seen at the hospital within the last three years: Yes ?Total Score: 115 ?Level Of Care: New/Established - Level 3 ?Electronic Signature(s) ?Signed: 05/21/2021 8:24:25 AM By: Sharyn Creamer RN, BSN ?Entered By: Sharyn Creamer on 05/15/2021 13:28:28 ?-------------------------------------------------------------------------------- ?Encounter Discharge Information Details ?Patient Name: Date of Service: ?Carlos White 05/15/2021 12:45 PM ?Medical Record Number: [id number] ?Patient Account Number: [id number] ?Date of Birth/Sex: Treating RN: ?01-27-1940 (81 y.o. Mare Ferrari ?Primary Care Charmane Protzman:  [name] Other Clinician: ?Referring Lailyn Appelbaum: ?Treating Dorla Guizar/Extender: Worthy Keeler ?[name] ?Weeks in Treatment: 189 ?Encounter Discharge Information Items ?Discharge Condition: Stable ?Ambulatory Status: Ambulatory ?Discharge Destination: Home ?Transportation: Private Auto ?Accompanied By: self ?Schedule Follow-up Appointment: Yes ?Clinical Summary of Care: Patient Declined ?Electronic Signature(s) ?Signed: 05/21/2021 8:24:25 AM By: Sharyn Creamer RN, BSN ?Entered By: Sharyn Creamer on 05/15/2021 13:29:27 ?-------------------------------------------------------------------------------- ?Lower Extremity Assessment Details ?Patient Name: Date of Service: ?Carlos White 05/15/2021 12:45 PM ?Medical Record Number: [id number] ?Patient Account Number: [id number] ?Date of Birth/Sex: Treating RN: ?12/20/1940 (81 y.o. Mare Ferrari ?Primary Care Lyllie Cobbins: [name] Other Clinician: ?Referring Normand Damron: ?Treating Reighlyn Elmes/Extender: Worthy Keeler ?[name] ?Weeks in Treatment: 189 ?Edema Assessment ?Assessed: [Left: No] [Right: No] ?Edema: [Left: Yes] [Right: Yes] ?Calf ?Left: Right: ?Point of Measurement: 35.5 cm From Medial Instep 33.5 cm 37.5 cm ?Ankle ?Left: Right: ?Point of Measurement: 11 cm From Medial Instep 25 cm 22.4 cm ?Vascular Assessment ?Pulses: ?Dorsalis Pedis ?Palpable: [Left:Yes] [Right:Yes] ?Electronic Signature(s) ?Signed: 05/21/2021 8:24:25 AM By: Sharyn Creamer RN, BSN ?Entered By: Sharyn Creamer on 05/15/2021 13:05:12 ?-------------------------------------------------------------------------------- ?Multi Wound Chart Details ?Patient Name: ?Date of Service: ?Carlos White 05/15/2021 12:45 PM ?Medical Record Number: [id number] ?Patient Account Number: [id number] ?Date of Birth/Sex: ?Treating RN: ?1940/09/25 (81 y.o. Mare Ferrari ?Primary Care Adriane Guglielmo: [name] ?Other Clinician: ?Referring Gatlyn Lipari: ?Treating Raileigh Sabater/Extender: Worthy Keeler ?[name] ?Weeks in  Treatment: 189 ?Vital Signs ?Height(in): 71 ?Pulse(bpm): 63 ?Weight(lbs): 220 ?Blood Pressure(mmHg): 175/80 ?Body Mass Index(BMI): 30.7 ?Temperature(??F): 97.9 ?Respiratory Rate(breaths/min): 20 ?Photos: [N/A:N/

## 2021-06-03 IMAGING — CT CT PELVIS W/O CM
3 series · 14 of 47 positions shown, 16 images · non-contrast
Comparison: Left hip radiographs 05/08/2020. CT abdomen and pelvis
05/25/2013.

CLINICAL DATA: Slip and fall injury with pelvic trauma.

EXAM:
CT PELVIS WITHOUT CONTRAST
TECHNIQUE: Multidetector CT imaging of the pelvis was performed following the
standard protocol without intravenous contrast.

[Series 6: axial st · axial · 0.92mm/px · z∈[+546,+796]mm · 8 of 58 slices shown, 10 images]
[im 4/58  brain]
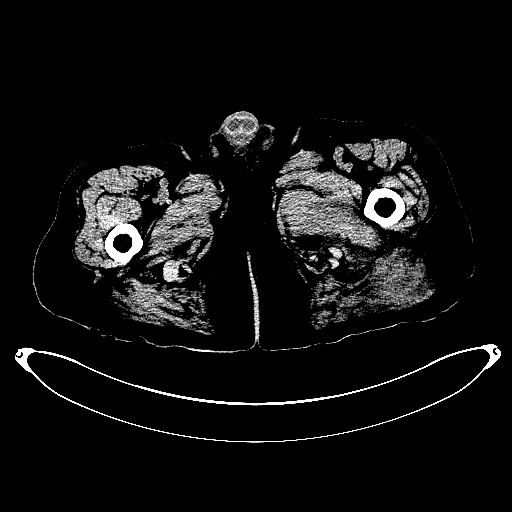
[im 4/58  bone]
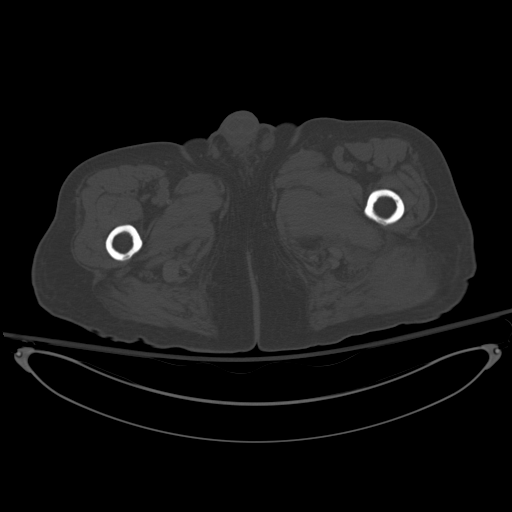
[im 12/58  brain]
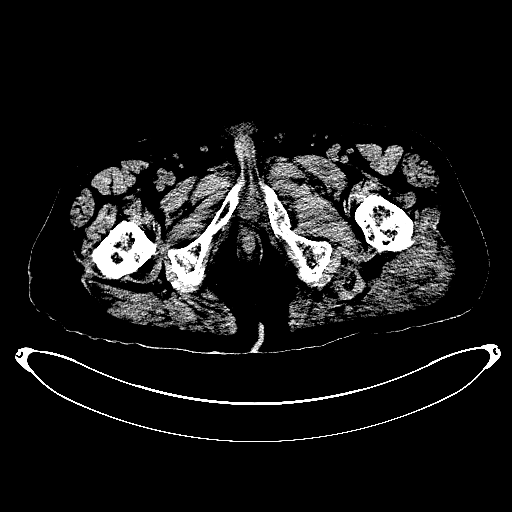
[im 18/58  brain]
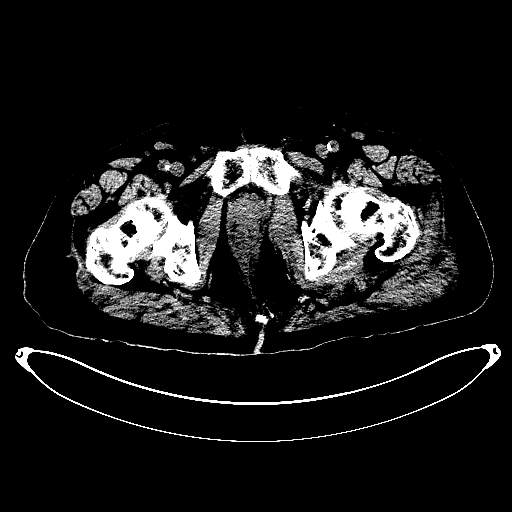
[im 26/58  brain]
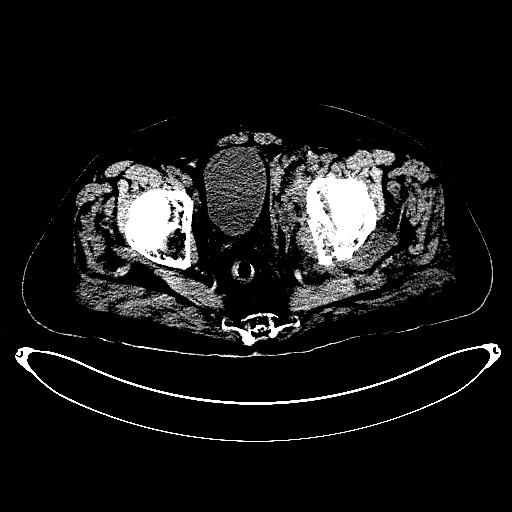
[im 32/58  brain]
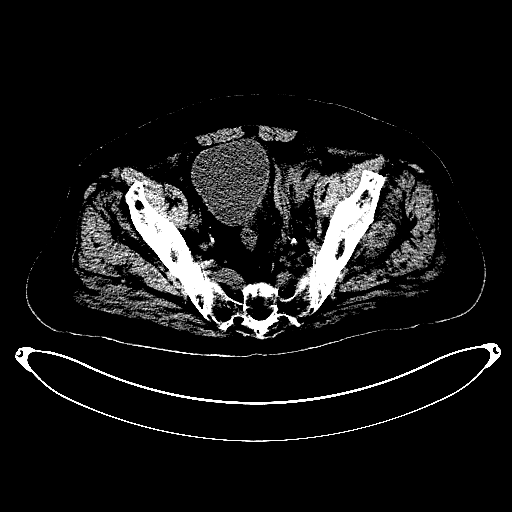
[im 32/58  bone]
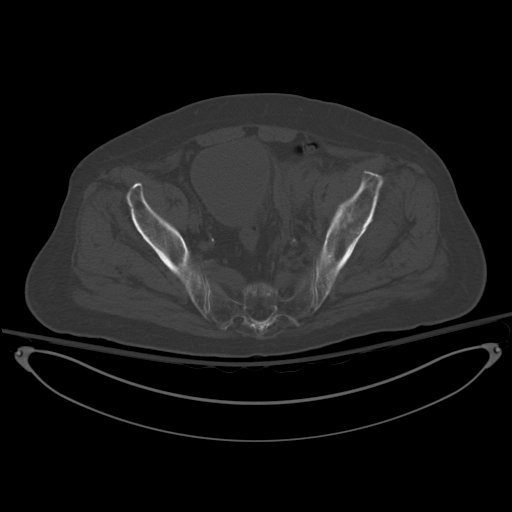
[im 40/58  brain]
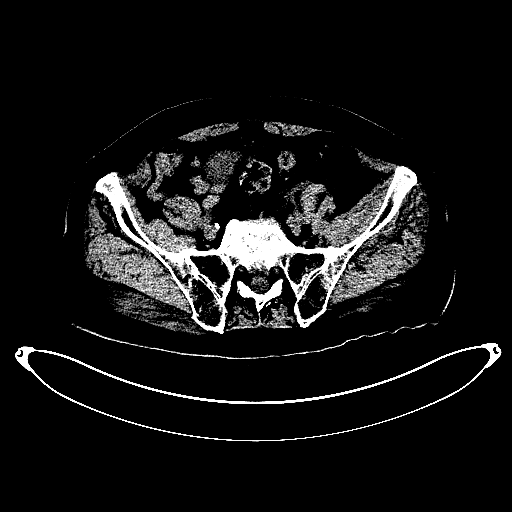
[im 46/58  brain]
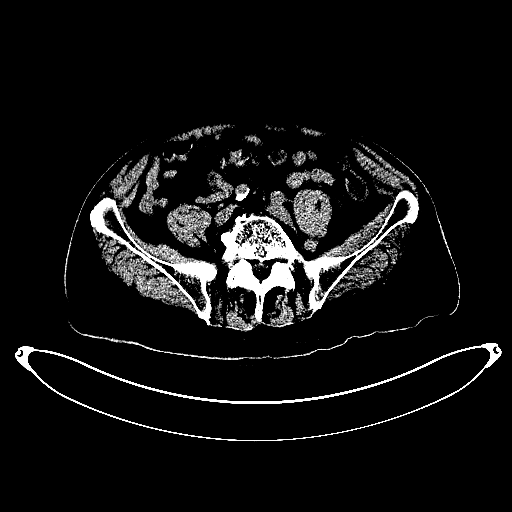
[im 54/58  brain]
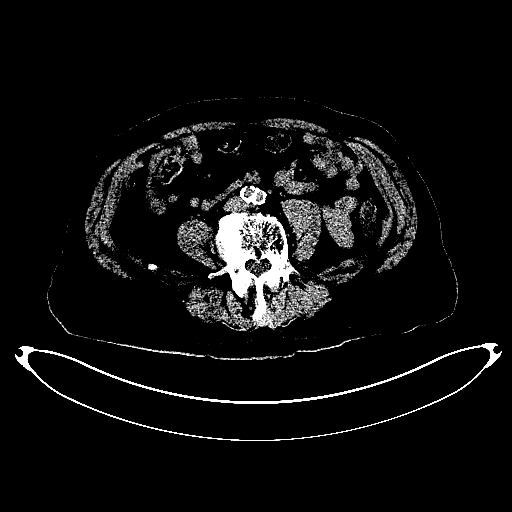

[Series 12: coronal st · coronal · 0.56mm/px · 3 of 185 slices shown]
[im 62/185  brain]
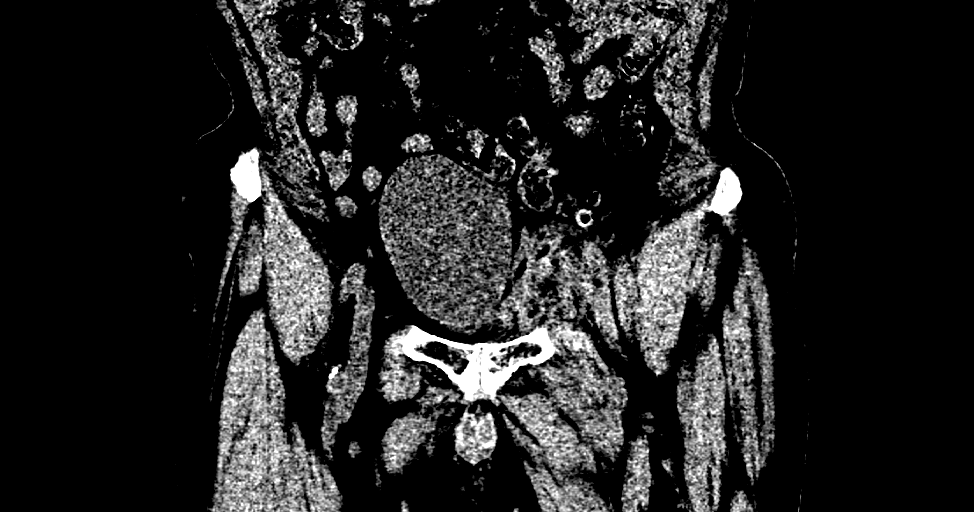
[im 82/185  brain]
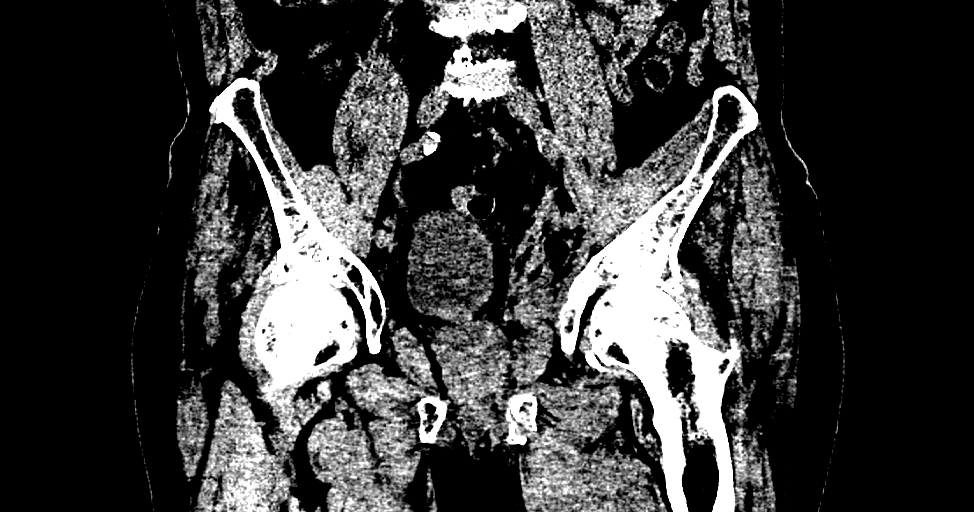
[im 103/185  brain]
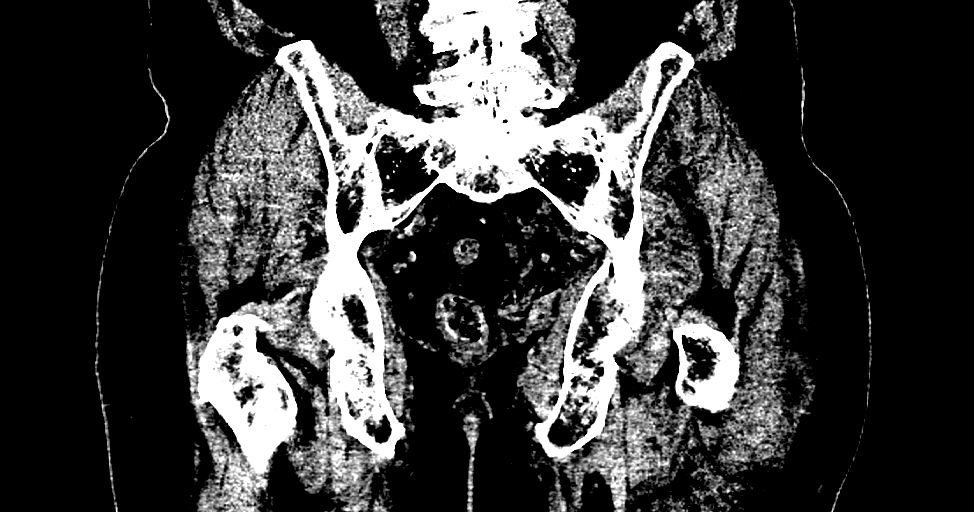

[Series 13: sagittal st · sagittal · 0.56mm/px · 3 of 225 slices shown]
[im 75/225  brain]
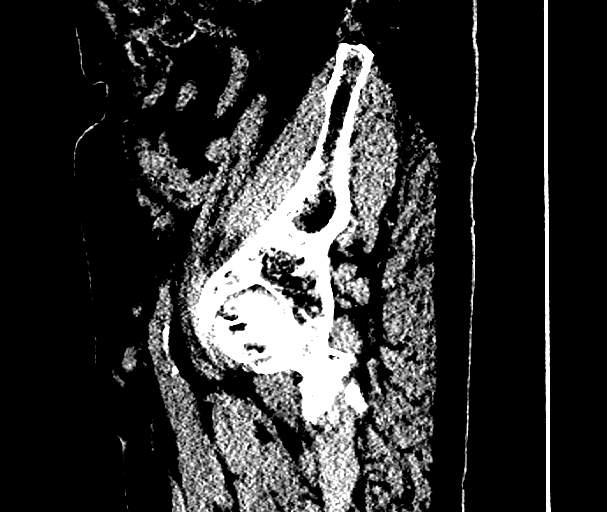
[im 113/225  brain]
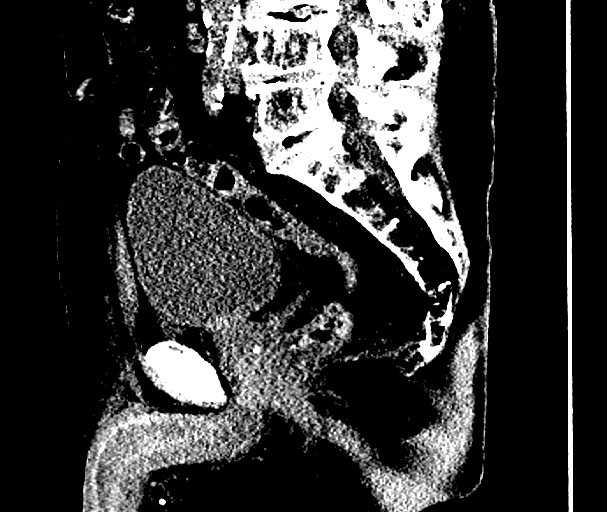
[im 150/225  brain]
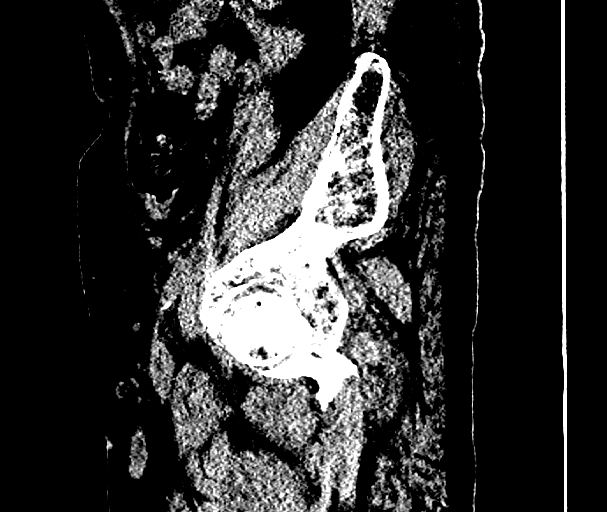

[14 of 47 positions shown; findings below may reference images not displayed]

FINDINGS: Urinary Tract: No bladder wall thickening or filling defect. No
bladder stones.

Bowel: Visualized portions of the colon and small bowel are not
abnormally distended.

Vascular/Lymphatic: Vascular calcifications in the iliac vessels and
distal aorta.

Reproductive:  Prostate gland is not enlarged.

Other:  No free air or free fluid in the pelvis.

Musculoskeletal: Comminuted fractures involving the left anterior
pelvis and acetabulum. There is involvement of the anterior, medial,
and posterior columns of the left acetabulum. Mild displacement of
the fracture fragments. Comminuted displaced fractures also
demonstrated in the inferior pubic ramus on the left with associated
deformity. Symphysis pubis and SI joints are nondisplaced. The right
pelvis appears intact. No fractures involving the left proximal
femur. No hip dislocation. Degenerative changes in the lower lumbar
spine. Large hematoma around the right hip involving the ileus
psoas, obturators, adductor, and gluteus maximus spaces. Hematoma in
the iliopsoas region displaces the bladder towards the right.
IMPRESSION: 1. Comminuted fractures involving the left anterior pelvis and
acetabulum with involvement of the anterior, medial, and posterior
columns of the left acetabulum. Comminuted displaced fractures of
the inferior left pubic ramus.
2. Large hematoma around the right hip involving the ileus psoas,
obturators, adductor, and gluteus maximus spaces. Hematoma in the
iliopsoas region displaces the bladder towards the right.
3. Aortic atherosclerosis.

Aortic Atherosclerosis ([id number]).

## 2021-06-03 IMAGING — CR DG HIP (WITH OR WITHOUT PELVIS) 2-3V*L*
3 series · 3 of 3 positions shown · non-contrast
Comparison: None.

CLINICAL DATA: Left hip pain after fall.

EXAM:
DG HIP (WITH OR WITHOUT PELVIS) 2-3V LEFT

[x pelvis]
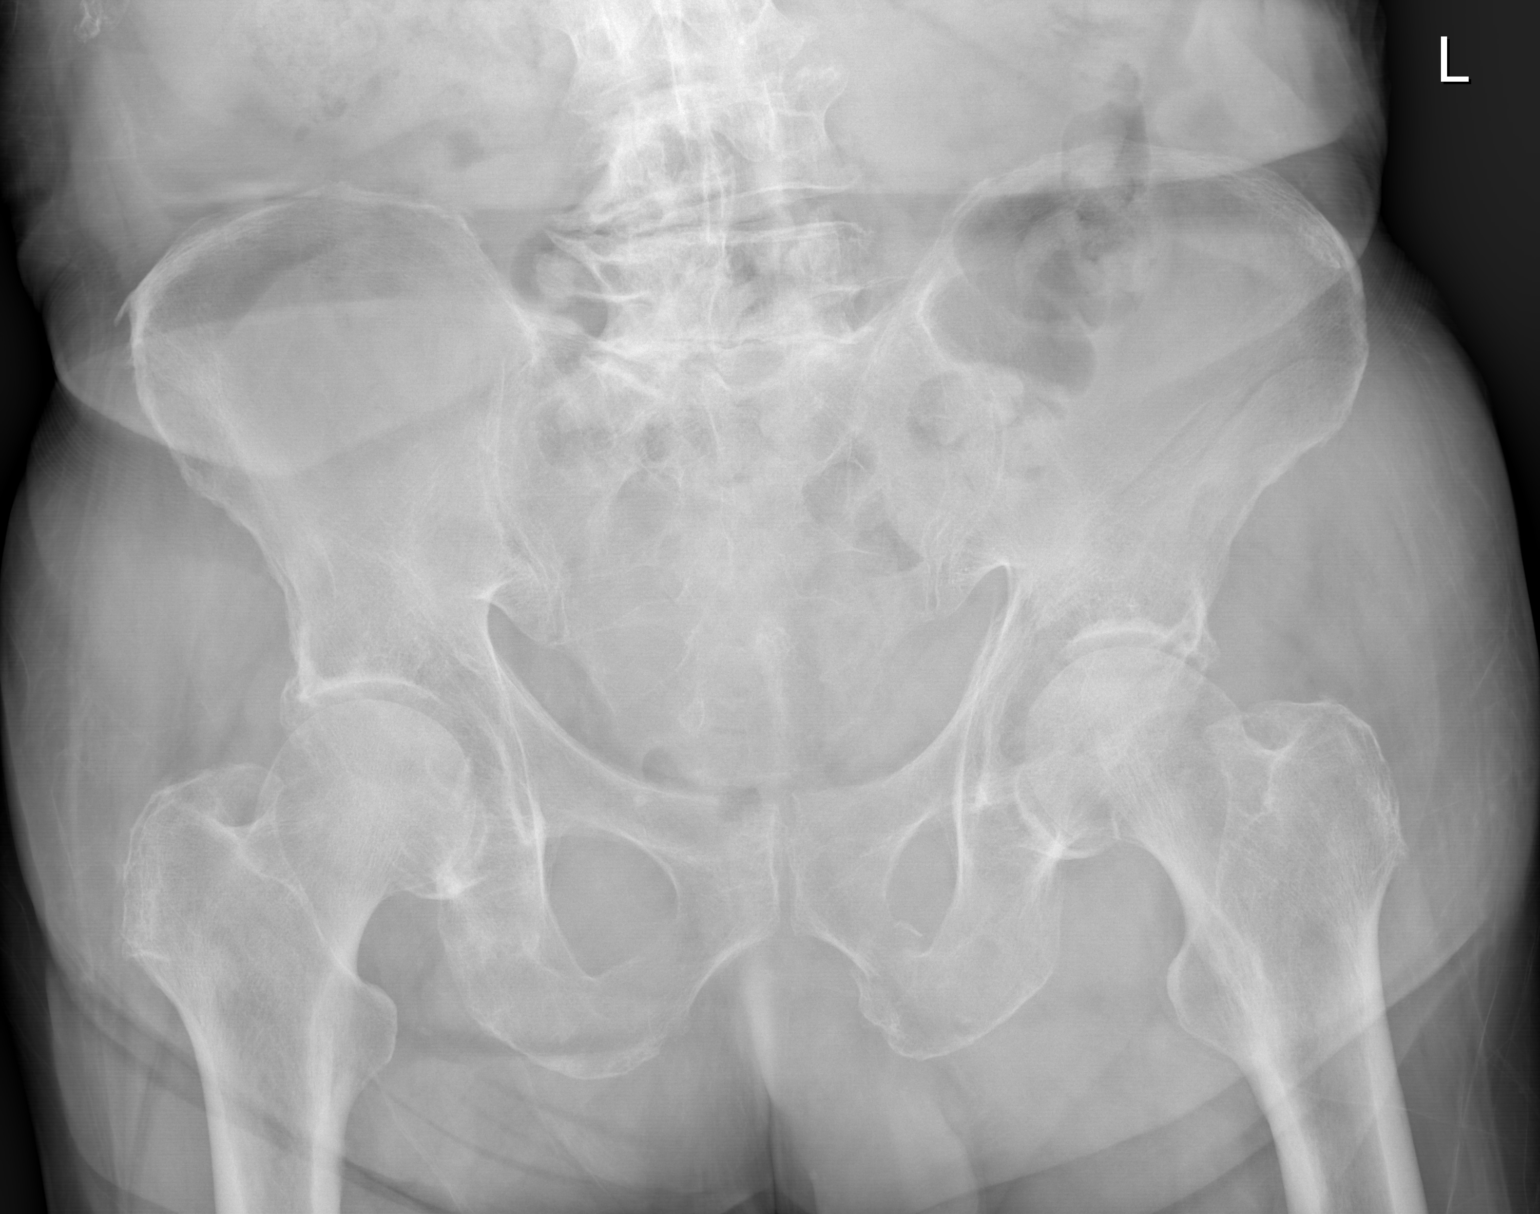

[x hip ap left]
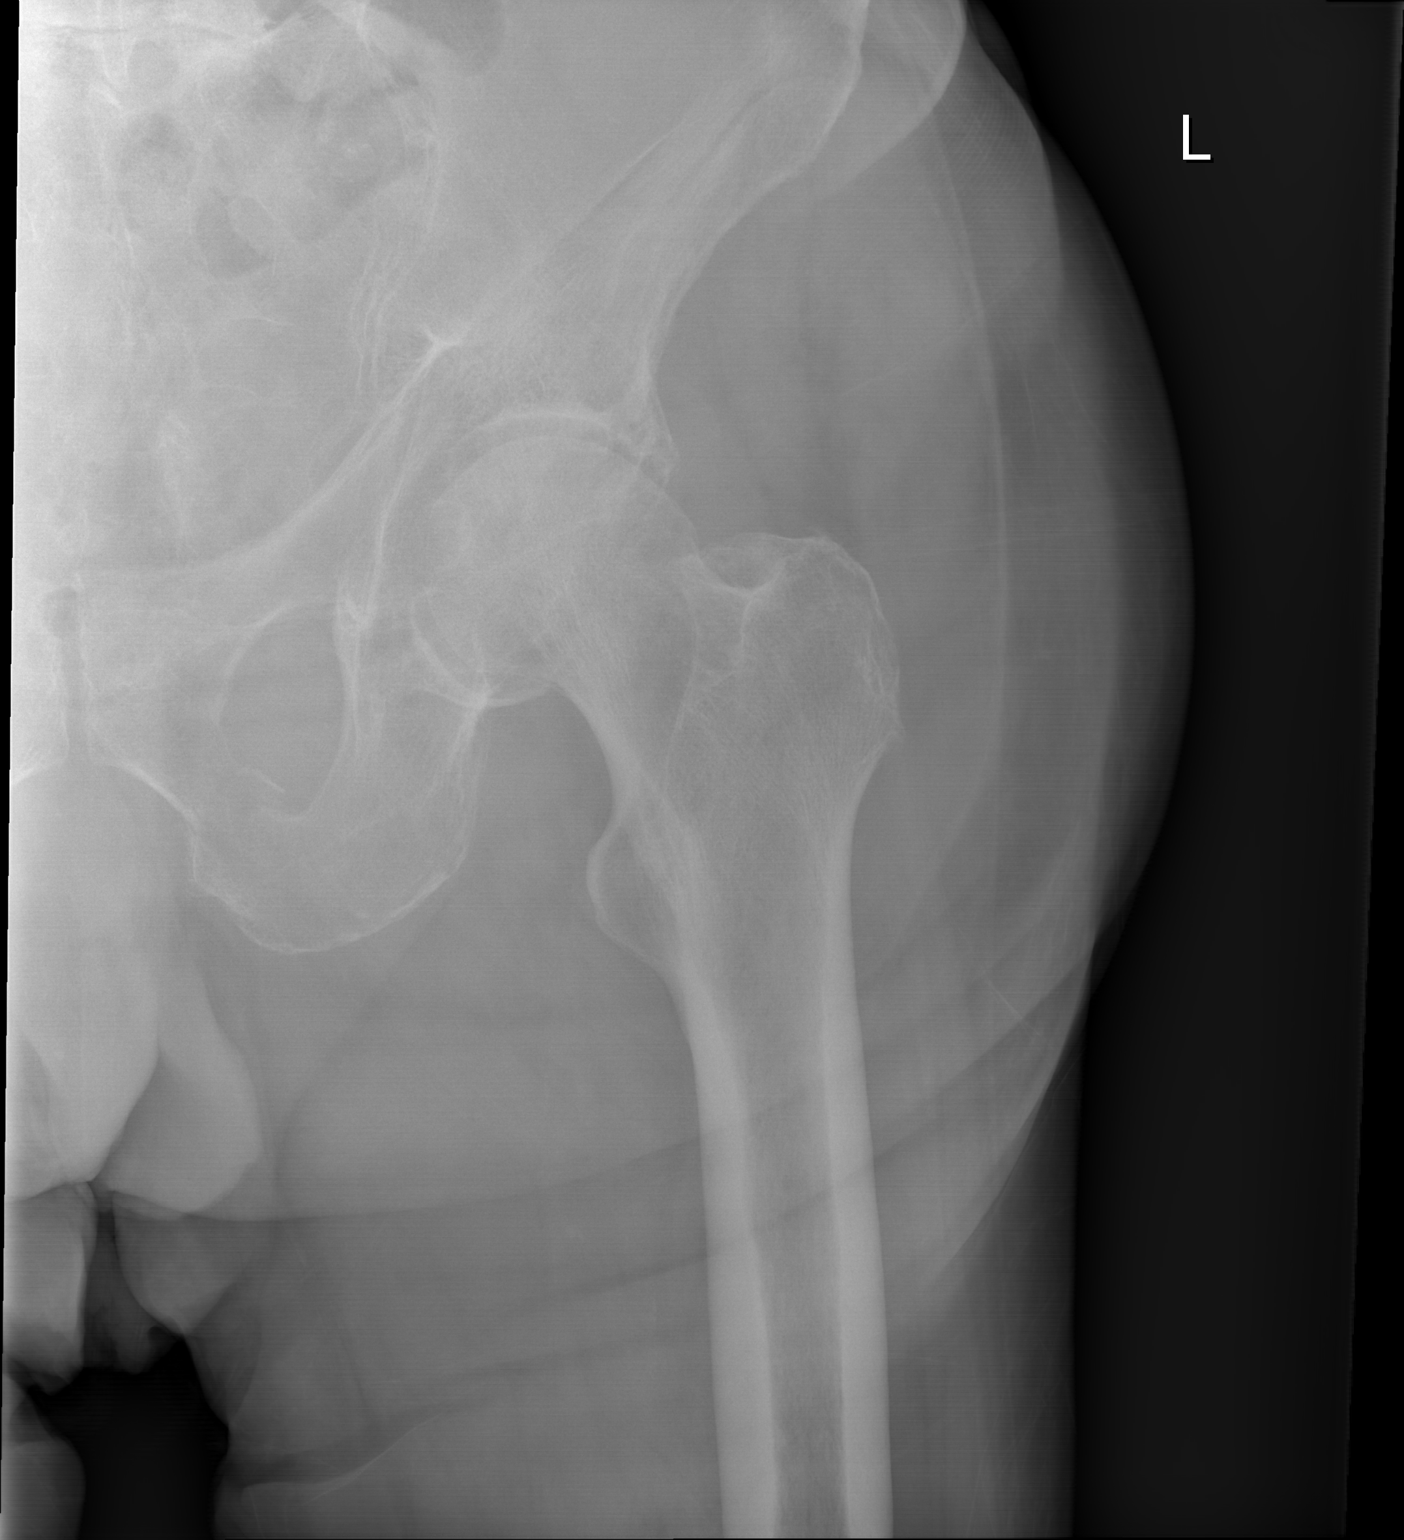

[w hip lat left]
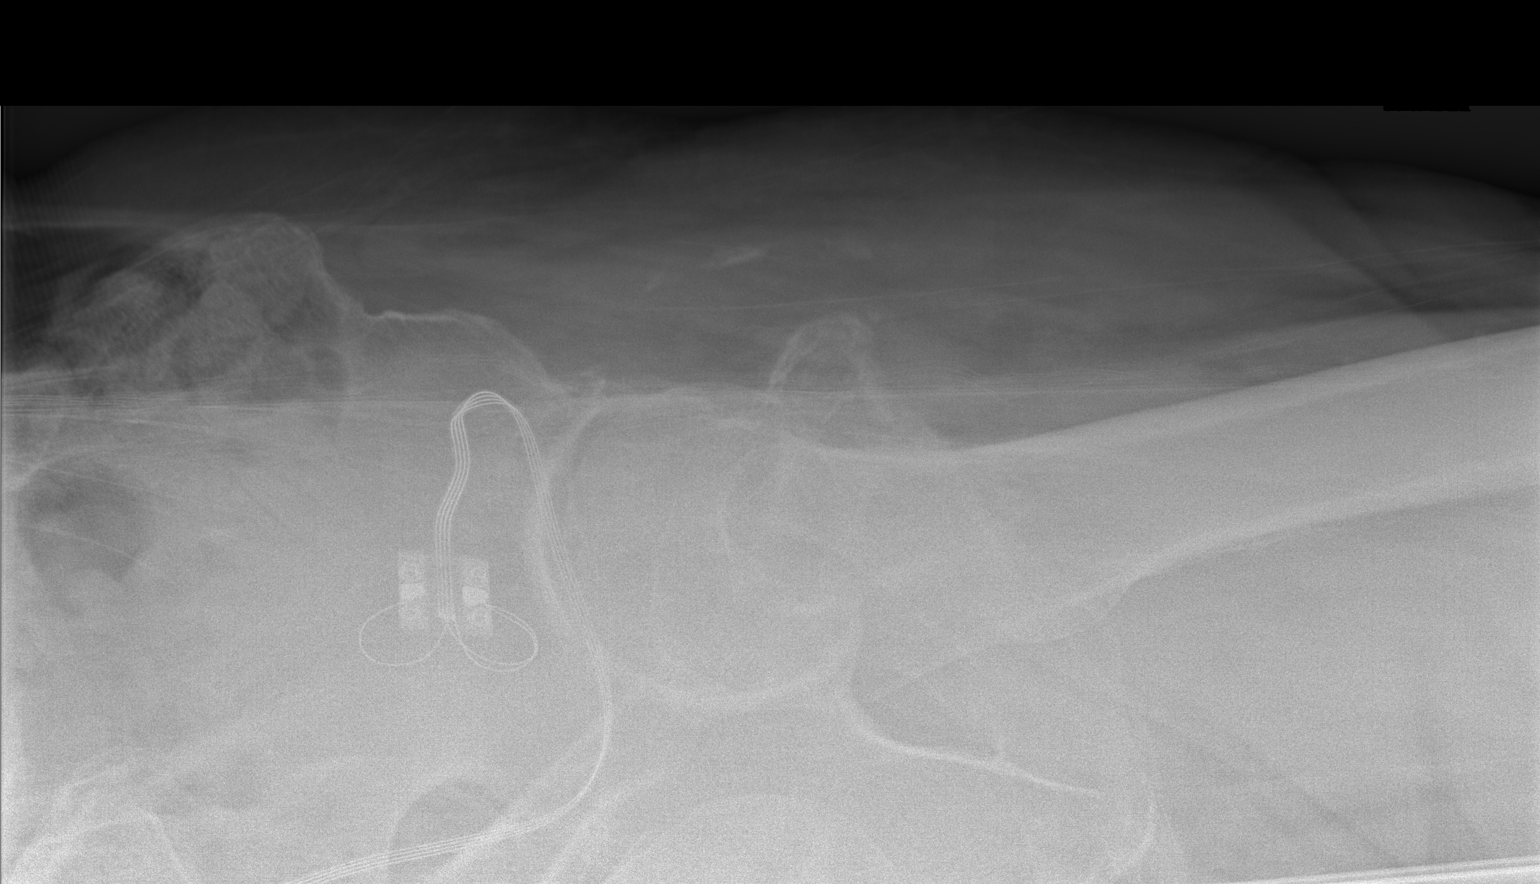

[3 of 3 positions shown; findings below may reference images not displayed]

FINDINGS: Mild osteophyte formation is seen involving the left hip. Mildly
displaced fracture is seen involving the left inferior pubic ramus.
No definite evidence of proximal femur fracture is noted.
IMPRESSION: Mildly displaced left inferior pubic ramus fracture. Mild
osteoarthritis of the left hip.

## 2021-06-11 ENCOUNTER — Other Ambulatory Visit: Payer: Self-pay | Admitting: Internal Medicine

## 2021-06-11 ENCOUNTER — Encounter: Payer: Self-pay | Admitting: Internal Medicine

## 2021-06-11 ENCOUNTER — Telehealth: Payer: Self-pay | Admitting: Internal Medicine

## 2021-06-11 MED ORDER — TRAMADOL HCL 50 MG PO TABS: 50.0000 mg | ORAL_TABLET | Freq: Two times a day (BID) | ORAL | 1 refills | Status: DC | PRN

## 2021-06-11 NOTE — Telephone Encounter (Signed)
Refill Tramadol which he takes sparingly for arthritis pain. He uses med responsibly. PMP aware checked. MJB, MD

## 2021-06-12 ENCOUNTER — Ambulatory Visit: Payer: Medicare PPO | Admitting: Physician Assistant

## 2021-06-17 DIAGNOSIS — H35033 Hypertensive retinopathy, bilateral: Secondary | ICD-10-CM | POA: Diagnosis not present

## 2021-06-17 DIAGNOSIS — H353132 Nonexudative age-related macular degeneration, bilateral, intermediate dry stage: Secondary | ICD-10-CM | POA: Diagnosis not present

## 2021-06-17 DIAGNOSIS — H43813 Vitreous degeneration, bilateral: Secondary | ICD-10-CM | POA: Diagnosis not present

## 2021-06-26 ENCOUNTER — Encounter (HOSPITAL_BASED_OUTPATIENT_CLINIC_OR_DEPARTMENT_OTHER): Payer: Medicare PPO | Attending: Physician Assistant | Admitting: Physician Assistant

## 2021-06-26 DIAGNOSIS — L97322 Non-pressure chronic ulcer of left ankle with fat layer exposed: Secondary | ICD-10-CM | POA: Diagnosis not present

## 2021-06-26 DIAGNOSIS — L97822 Non-pressure chronic ulcer of other part of left lower leg with fat layer exposed: Secondary | ICD-10-CM | POA: Insufficient documentation

## 2021-06-26 DIAGNOSIS — I10 Essential (primary) hypertension: Secondary | ICD-10-CM | POA: Insufficient documentation

## 2021-06-26 DIAGNOSIS — Q819 Epidermolysis bullosa, unspecified: Secondary | ICD-10-CM | POA: Insufficient documentation

## 2021-06-26 DIAGNOSIS — L97812 Non-pressure chronic ulcer of other part of right lower leg with fat layer exposed: Secondary | ICD-10-CM | POA: Diagnosis not present

## 2021-06-26 DIAGNOSIS — I872 Venous insufficiency (chronic) (peripheral): Secondary | ICD-10-CM | POA: Diagnosis not present

## 2021-06-26 NOTE — Progress Notes (Addendum)
TRESHON, STANNARD ([id number]) Visit Report for 06/26/2021 Chief Complaint Document Details Patient Name: Date of Service: Carlos White 06/26/2021 12:45 PM Medical Record Number: [id number] Patient Account Number: [id number] Date of Birth/Sex: Treating RN: 1940/12/01 (81 y.o. [name] Primary Care Provider: [name] Other Clinician: Referring Provider: Treating Provider/Extender: [name] in Treatment: [place] from: Patient Chief Complaint Bilateral LE Ulcers Electronic Signature(s) Signed: 06/26/2021 12:52:05 PM By: Worthy Keeler PA-C Entered By: Worthy Keeler on 06/26/2021 12:52:04 -------------------------------------------------------------------------------- HPI Details Patient Name: Date of Service: [name], [name] 06/26/2021 12:45 PM Medical Record Number: [id number] Patient Account Number: [id number] Date of Birth/Sex: Treating RN: April 19, 1940 (81 y.o. [name] Primary Care Provider: [name] Other Clinician: Referring Provider: Treating Provider/Extender: [name] in Treatment: 195 History of Present Illness HPI Description: 09/30/17 on evaluation today patient presents for initial evaluation and our clinic concerning issues that he has been having with his bilateral lower extremities. He states this has been going on for quite some time at least six months. Currently his regiment has been mainly cleaning the area with peroxide, applying the is foreign ointment, and wrapping the area with ABD pads and then an ace wrap loosely. He has dealt with issues of this nature he tells me for quite some time. He does have a history of having had a compound fracture of the left lower extremity which he thinks also makes this a much more difficult area for him to heal. He's previously been told that he had poor vascular flow but this was years ago at [facility] we do not have any of those  records at this time. He has a history of Epidermolysis Bullosa which was diagnosed around age 36 and he has been cared for at [facility] since that time. Subsequently he states this is hereditary and two of his children one male and one male also have this as well is one of his grandchildren that he is aware of. He has no evidence of infection necessarily at this point although he does have some necrotic tissue noted on the surface of the wound as far as the largest, left lateral lower extremity ulcer, is concerned. Overall I feel like all things considered he's been taking care of this very well. Obviously he has some fairly significant issues going on at this point in this regard. He does have a history otherwise of hypertension though for the most part other than the compound fracture of the left leg he seems to have been fairly healthy in my pinion. 10/07/17 on evaluation today patient actually appears to be doing better in regard to his bilateral lower extremity ulcers. With that being said he does still have some evidence of slough noted on the surface of the wounds I think the Iodoflex has been beneficial for him. His arterial studies are scheduled for October 2. With that being said I do believe that he is continuing to show signs of good improvement which is at least good news. 10/14/17 on evaluation today patient appears to be doing very well in regard to his lower extremity ulcers. He's definitely made some progress as far as healing is concerned although there still are several open areas that are going to need to be addressed. He did have his arterial study today which fortunately shows good findings with a right ABI of 1.23 with a TBI of 0.86 in the left ABI  of 1.28 with a TBI of 0.81. This is good news and will allow [place] to perform debridement as well. 10/23/2017; patient with a large wound on the left lateral calf, sizable area on the left medial malleolus and an area on the right lateral  malleolus. He has a new blister consistent with his underlying blistering skin disease just above this area we have been using Iodoflex on the lateral left calf lateral right ankle and collagen on the medial left ankle. We have been using Kerlix Coban wraps 10/28/17 on evaluation today the patient continues to have signs of improvement in regard to the overall appearance of the original wound. Unfortunately he did have some blistering over the right lateral lower extremity which has appeared to rupture on evaluation today and likely some of the dead tissue on the surface needs to be cleaned away the good news is this does not appear to be to significantly deep at this time. 11/04/17 on evaluation today patient actually appears to be doing a little better in regard to his lower extremity ulcers. He has been tolerating the dressing changes without complication. With that being said he does still have a significant wound especially over the left lateral lower extremity unfortunately. All of the wounds pretty much are going to require sharp debridement today. 11/11/17 on evaluation today patient appears to be doing more poorly in regard to his left lower extremity in particular. There does not appear to be any evidence of systemic infection although the wound itself as far as the larger left lateral lower extremity ulcer actually appears to be infected in my pinion. There's an older and the surface of the wound is dramatically worsened compared to last week. No fevers, chills, nausea, or vomiting noted at this time. 11/18/17 upon evaluation today patient actually appears to be doing better. I did review his culture today which really did not show any specific organism is a positive reason for his wound decline. There are multiple organisms present not predominant. Nonetheless he seems to be tolerate the doxycycline well in his wounds in general do seem to be doing better. Fortunately there does not appear  to be any evidence of infection at this time which is good news. Overall I'm very pleased with how things appear. Nonetheless he still has a lot of healing to go. I do think he could benefit from a Juxta-Lite wrap. 11/25/17 on evaluation today patient actually appears to be doing fairly well in regard to his wounds. He is still taking the antibiotics he has a few days left. Fortunately this seems to have been excellent for him as far as getting the infection control and very happy in this regard. With that being said the patient likewise is also very pleased with how things appear at this time in comparison to where we were he's not having as much pain. 12/02/17 Seen today for follow p and management of LLE wounds. Wounds appear to show some improvement. He denies pain, fever, or chills. Completed a course of doxycycline earlier this month. Scheduled to received Juxta-Lite wrap this week. No s/s of infections. 12/09/17 on evaluation today patient actually appears to be doing a little bit better in regard to his wounds. This is obscene very slow process and unfortunately he has a couple of new areas and this is due to the Epidermolysis Bullosa. Nonetheless I am concerned about the fact that he seems to be getting more areas not less that is the reason we're gonna work on  getting schedule for the vascular referral to see the venous specialist. 12/23/17 upon evaluation today patient's wounds currently shows evidence of still not doing quite as well is what I would like to have seen. Subsequently the patient did have his venous study which showed evidence of venous stasis. Subsequently I do think that a vascular evaluation for consideration of venous intervention would be appropriate. I'm not necessarily suggesting that will be anything that can be done but I think it is at least a good idea. He is in agreement with this plan. 12/30/17 on evaluation today patient actually appears to be doing very well in  regard to his wounds when compared to previous evaluation. Subsequently we have been using the [employer] Dressing which actually appears to have done excellent on his left lateral lower extremity ulcer. The quality of the wound surface is dramatically improved. There is some slight debridement that is going to be required at a couple of locations but overall I'm extremely pleased with how things appear here. 01/07/2018; this is a patient with a primary skin disorder epidermolyis bullosa. Is a large wound on the left lateral calf and smaller wounds on the right however there is a new wound on the right mid tibia area that occurred within the compression wrap that he did not change. We have been using Hydrofera Blue. On the left he is using Hydrofera Blue and Santyl to the inferior part of the wound and changing the dressing himself. 01/15/2018; primary skin disorder epidermolysis bullosa. He has several difficult wounds including the left lateral calf, smaller wounds on the left medial calf and the right lateral calf. The major area on the left lateral calf has a smaller area inferiorly that has necrotic debris we have been using Santyl to this. The rest of the wounds we have been using Hydrofera Blue. The area on the left calf actually looks larger this week. Uncontrolled edema several small open areas above it that are superficial 01/20/18 on evaluation today patient appears to be doing better as compared to last week in regard to his wounds of the bilateral lower extremities. He tolerated the bilateral compression wrap without complication. Overall I'm very pleased with how things appear at this time. The patient likewise is very happy. 01/27/18 on evaluation today patient appears to be doing decently well in regard to his bilateral lower extremity ulcers. He's been tolerating the dressing changes without complication. One issue he had is that he did have more drainage to the left leg wrapped last  week. He states in fact he probably should come in and let us change it on Friday however he just left it in place and kept adding extra absorption with ABD pads to the external portion of the wrap. Unfortunately he does have some aspiration type breakdown nothing significant but I do believe that this was probably counterproductive in general. Nonetheless his wounds do not appear to be terrible overall. 02/03/18 on evaluation today patient appears to be doing rather well in regard to his lower extremity ulcers. He has been tolerating the dressing changes without complication. He does tell me that he had to change the wrap on the left one since we last saw him. Subsequently I do not see any evidence of infection I do feel like the food was much better controlled at this point. 02/10/18 on evaluation today patient appears to be doing rather well in regard to his ulcers. He still has significant alterations especially on the left lateral lower extremity.  Fortunately there's no signs of infection at this time. Overall I feel like he is making good progress are some areas that I'm gonna attempt some debridement today. 02/17/18 on evaluation today patient appears to be doing okay in regard to his lower Trinity ulcer. It does appear on both locations he has a little bit of drainage causing some breakdown in maceration around the wound bed's although it doesn't appear to be too bad the right is a little bit worse than left. Fortunately there's no signs of infection which is good news. No fevers, chills, nausea, or vomiting noted at this time. 03/10/18 on evaluation today patient actually appears to be doing rather poorly in regard to his bilateral lower extremity ulcers. The right in particular is draining profusely and the wound is actually enlarging which is not good. I'm concerned about both possibly infection and the fact that there's a lot of moisture which is causing breakdown as well. Unfortunately the  patient has been trying to change this at home I'm afraid he may need to change more frequently in order to see the improvement that were looking for. There's no signs of systemic infection. 03/17/18 patient actually appears to be doing significantly better at this point in regard to his bilateral lower extremity ulcers. Fortunately there's no signs of infection. That is worsening infection at least indefinitely nothing systemic. With that being said he is having a lot of drainage though not quite as much is there in his last evaluation. Overall feel like he's on the side of improvement. I think if his results back from his culture which showed that he had a positive group B strep along with abundant Pseudomonas noted on the culture. For that reason I am gonna have him continue with the linezolid as we previously have ordered for him and I did go ahead as well today and prescribe Levaquin as well in order to treat the Pseudomonas portion of the infection noted. 03/24/18 on evaluation today patient actually appears to be doing very well in regard to his lower Trinity ulcer. He's been tolerating the dressing changes without complication. Fortunately both legs show signs of less drainage in his edema is very well controlled at this point as well. Overall very pleased with how things seem to be progressing. 03/31/18 on evaluation today patient actually appears to be doing excellent in regard to his bilateral lower extremity ulcers. These are not draining nearly as significant as what they were in the past overall seem to be shown signs of excellent improvement which is great news. Fortunately there is no sign of active infection at this time I do believe that the Levaquin has done extremely well for him in this regard. The patient continues to change these at home typically every day. We may be able to slowly work towards every other day changes since the drainage seems to be slowing down quite  significantly. 04/14/18 on evaluation today patient appears to be doing well in regard to his bilateral lower extremities. Let me Hilda Blades Almost completely healed which is excellent news. Fortunately he's shown signs of improvement all other sites as well with new skin growth there's some slight hyper granular tissue but for the most part this seems to be well maintained with the [facility] Dressing. I'm very happy in this regard. 04/28/18 on evaluation today patient appears to be doing rather well in regard to his ulcers of the bilateral lower extremities all things considering. He continues to make some progress as far  as new skin growth. There still some hyper granulation noted at this point despite the use of the [facility] Dressing. This is not terrible but I think we may want to consider conclude cauterization today with silver nitrate to try to help knock some of his back as well as helping with any biofilm on the surface of the wound. 05/12/18 on evaluation today patient's wounds actually appear to be doing fairly well in regard to the bilateral lower extremities. He's been tolerating the dressing changes without complication. Fortunately there's no signs of active infection at this time which is good news. Overall very pleased with how things seem to be progressing. You select silver nitrate was beneficial for him. 05/26/18 on evaluation today patient appears to be doing better in regard to left lower extremity and a little bit worse in regard to the right lower extremity. He states that he was pulling off the [facility] Dressing peel back some of the skin making this area significantly larger than what it was previous. He's not had any issues other than this and states even that hasn't caused any pain he just seems to obviously have a much larger area on the right when compared to the previous time I saw him. No fevers, chills, nausea, or vomiting noted at this time. 06/16/18 on  evaluation today patient actually appears to be doing a little better in my pinion in regard to his lower summary ulcers. He has new skin islands that seem to be spreading which is good news. Fortunately there's no signs of active infection at this time. His biggest issue is he tells me that coming as often as he does is becoming very cost prohibitive. He wonders if we can potentially spread this out. 07/14/18 on evaluation today patient appears to be doing a little bit worse in regard to his lower from the ulcer. Unfortunately he still continues to have a significant amount of drainage I think we need to do something to try to help this more. He is still somewhat reluctant to go the route of the Wound VAC although that may be the most appropriate thing for him. No fevers, chills, nausea, or vomiting noted at this time. 08/18/2018 on evaluation today patient actually appears to be doing quite well with regard to his bilateral lower extremity ulcers. I do feel like that currently he is making great progress the care max does seem to be doing a great job at helping to control the moisture he has no maceration or skin breakdown. Again this seems to be an excellent way to go. 1 thing we may want to change is adding collagen to the base of the wound and then the care max over top he is not opposed to this. 09/15/2018 on evaluation today patient appears to be doing well with regard to his bilateral lower extremity ulcers. He is showing some signs of improvement not necessarily in size but definitely in appearance. In fact he has a lot of new skin growing throughout the wounds along the edges as well as in the central portion of the wounds on both lower extremities. Overall I am extremely pleased to see this. 10/20/2018 on evaluation today patient actually appears to be doing quite well with regard to his wounds. They are not measuring significantly smaller but he does have a lot of new epithelization noted as  compared to previous. Fortunately there is no signs of active infection at this time. No fevers, chills, nausea, vomiting, or diarrhea. 11/17/2018  on evaluation today patient presents for reevaluation concerning his bilateral lower extremity ulcers. Fortunately there is no signs of active infection at this time today. He has been tolerating the dressing changes without complication. No fevers, chills, nausea, vomiting, or diarrhea. Unfortunately in general the patient has not made as much improvement as I would like to have seen up to this point. He has been tolerating the dressing changes without complication and he does an excellent job taking care of his wounds at home in my opinion. The biggest issue I see is that he is just not making the progress that we need to be seeing currently. I think we may want to consider having him seen at a plastic surgery appointment and he has previously seen someone in years past at [facility] in [place]. That is definitely a possibility for [place] to look into at this point. 12/29/2018 on evaluation today patient appears to be doing better in regard to the overall visual appearance of his wounds which do not appear to be as macerated. He does have a much larger skin island in the middle of the left lower extremity ulcer which is doing much better. He tells me the pain is also significantly better. With that being said overall his improvement as far as the size of the wounds is not better but again these are very irregular in change shape quite often. Fortunately there is no evidence of active infection at this time which is great news. He never heard anything from [facility] regarding the plastic surgery referral that we made to them. 01/26/2019 upon evaluation today patient appears to be doing a little better in regard to his wounds today. He has been tolerating the dressing changes again he performs these for the most part on his own. He does a great  job wrapping his legs in my opinion. Unfortunately he has not been able to get down to [facility] to see if there is anything from a plastic surgery standpoint that could be done to help with his legs simply due to the fact that his wife unfortunately sustained a compression fracture in her spine she is seeing Dr. Saintclair Halsted and subsequently is going to be having what sounds to be a kyphoplasty type procedure. With that being said that has not been scheduled yet there is still waiting on an MRI the patient is very busy in fact overly busy trying to help take care of his wife at this point. I completely understand this is more of a strain on him at this time 02/23/2019 upon evaluation today patient actually appears to be making some progress. I am actually very pleased with the overall appearance of his wounds even compared to last evaluation. He seems to be doing quite well. He is taking care of his wife unfortunately she did have a compression fracture she has had the procedure for this but still she has a slow road to recovery. For that reason he still not gone to [facility] for a second opinion in this regard. Obviously the goal there was if there was anything that can be done from a skin graft standpoint or otherwise. 03/23/2019 upon evaluation today patient continues to have issues with lower extremity ulcers. Since the beginning he has made progress but at the same time the wounds unfortunately just will not close. We have been trying to get the patient to [facility] to see a specialist there but unfortunately with the everything going on with his  wife he has not been able to make that appointment time yet he states he may be able to in the next 1-2 months but is not really sure. 04/27/2019 on evaluation today patient appears to be doing a little bit more poorly. His last evaluation. He appears to have some erythema around the edges of the wound at this point. Fortunately there is no  signs of active infection at this time which is good news. No fevers, chills, nausea, vomiting, or diarrhea. 06/08/2019 on evaluation today patient appears to be doing well with regard to his wounds. Overall they are actually measuring smaller compared to the last visit last month. We did treat him for Pseudomonas as well as methicillin-resistant Staph aureus. He was only on the treatment for MRSA however for 7 days as the Cipro was resistant and subsequently we had to place him on doxycycline. Nonetheless I am thinking that we may want to add the doxycycline and just do a month-long treatment considering the longstanding nature of his wounds and see if we get this under better control. 07/13/2019 upon evaluation today patient appears to be doing fairly well in regard to his bilateral lower extremities. There does not appear to be any signs of active infection which is good news. No fevers, chills, nausea, vomiting, or diarrhea. 08/10/2019 upon evaluation today patient appears to be doing about the same with regard to his wounds in general. Unfortunately he is not significantly better although is also not significantly worse which is great news there is no evidence of active infection at this time which is good news. He still dealing with a lot going on with his wife and therefore is not really able to go see anyone at the specialty clinic at [facility] that we have previously set up still. 09/07/2019 on evaluation today patient appears to be doing well with regard to his wounds. In fact this is probably the best that have seen so far in quite a few months. Overall I am very pleased with where things stand at this time. No fevers, chills, nausea, vomiting, or diarrhea. 10/05/2019 upon evaluation today patient appears to be doing more poorly in regard to his legs at this point. He actually is showing some signs of infection. This has been something that we seem to be back-and-forth with as far as trying to keep  these wounds from becoming infected. He takes care of them very well in my opinion but nonetheless I am concerned in this regard. He tells me that his wife is still doing really about the same she is slowly getting better. Nonetheless he still spends the majority of his time helping to take care of her. 11/02/2019 upon evaluation today patient actually appears to be doing somewhat better in regard to his legs. I do believe that the compounded antibiotic treatment from [facility] has been beneficial for him. Overall I am extremely pleased with where things stand today. There is no signs of active infection at this time. The patient states he has much less drainage than he has in the past. 12/07/2019 upon evaluation today patient appears to be doing well at this time in regard to the overall appearance of his wound bed. Currently there is no signs of active infection at this time. 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 Especially when  he stressed. Fortunately there is no signs of active infection locally or systemically at this point. 01/18/2020 on evaluation today patient appears to be doing well with regard to his leg ulcers. He has been tolerating the dressing changes without complication. Fortunately there is no signs of infection and overall I feel like his legs are doing about the best they have done in quite some time. There does not appear to be any evidence of active infection which is great news and overall very pleased. 02/29/2020 upon evaluation today patient's wounds actually appear to be doing quite well currently. There is no sign of active infection at this time. No fevers, chills, nausea, vomiting, or diarrhea. 04/11/2025 on evaluation today patient appears to be doing excellent in regard to his wounds on the legs to be  honest. He has made a lot of progress there does not appear to be any signs of active infection and overall I think that he is better than previous although still the wounds are quite significant obviously. In general I think that he is pleased with where things stand at this point. Still there is quite a bit of work to do here. 07/13/2020 upon evaluation today patient appears to be doing well with regard to his wound. Is been tolerating the dressing changes without complication on both legs. He just has the 2 main wounds on each leg at the ankle on the medial region of the right leg is completely healed. His wounds do seem to have gotten better during the time that he has been on bedrest as result of the pelvis fracture. Obviously does not the way that we wanted to see things improved but nonetheless he tells me that it is what it is. Fortunately he does seem to be doing better he tells me that her daughter from the [place] is actually down helping to take care of them out as he and his wife while he is recovering. 08/08/2020 upon evaluation today patient appears to be doing well currently in regard to his leg ulcers. He has been tolerating the dressing changes without complication. Fortunately there is no signs of active infection at this time. No fevers, chills, nausea, vomiting, or diarrhea. 09/26/2020 upon evaluation today patient appears to be doing well with regard to his wound. He has been showing signs of good improvement which is great news and overall I am extremely pleased with where things stand today. I do think that he does well with his dressing changes and overall has really improved significantly here. segment how that how she doing 11/14/2020 upon evaluation today patient appears to be doing well with regard to his leg ulcers. He has been tolerating the dressing changes without complication. Fortunately there does not appear to be any signs of active infection at this time. No fevers,  chills, nausea, vomiting, or diarrhea. 12/26/2020 upon evaluation today patient actually appears to be making some good progress here in regard to his wounds. Fortunately I do not see any signs of active infection which is great news. No fevers, chills, nausea, vomiting, or diarrhea. 02/06/2021 upon evaluation today patient appears to be doing well with regard to his wound currently. He has been tolerating the dressing changes without complication. Fortunately there does not appear to be any evidence of active infection locally nor systemically at this time. No fevers, chills, nausea, vomiting, or diarrhea. Overall I think that this is very slow but does seem to be getting a little bit better and a little smaller he  has been through a lot of stress recently with his wife she is having a heart evaluation tomorrow to see whether or not an aortic valve replacement is something that she could tolerate and undergo. 04/03/2020 upon evaluation today patient's legs are actually showing signs of significant improvement. This is actually a dramatic overall improvement since I last saw him I think we are getting very close to closing these wounds. That is an amazing thing is we been dealing with this for quite some time. The patient is very pleased to hear things appear to be doing well as well. 05-15-2021 upon evaluation today patient appears to be doing well with regard to his wound on the bilateral lower extremities. Both are showing signs of excellent improvement which is great news. There does not appear to be any signs of active infection locally nor systemically at this time. No fevers, chills, nausea, vomiting, or diarrhea. 06-26-2021 upon evaluation today patient appears to be doing well with regard to his wounds on the legs. They are actually showing signs of improvement in my opinion which is great news. Fortunately I do not see any evidence of active infection locally or systemically which is great news.  No fevers, chills, nausea, vomiting, or diarrhea. Electronic Signature(s) Signed: 06/26/2021 1:27:35 PM By: Worthy Keeler PA-C Entered By: Worthy Keeler on 06/26/2021 13:27:34 -------------------------------------------------------------------------------- Physical Exam Details Patient Name: Date of Service: Carlos White 06/26/2021 12:45 PM Medical Record Number: [id number] Patient Account Number: [id number] Date of Birth/Sex: Treating RN: 11-07-1940 (81 y.o. [name] Primary Care Provider: [name] Other Clinician: Referring Provider: Treating Provider/Extender: [name] in Treatment: 93 Constitutional Well-nourished and well-hydrated in no acute distress. Respiratory normal breathing without difficulty. Psychiatric this patient is able to make decisions and demonstrates good insight into disease process. Alert and Oriented x 3. pleasant and cooperative. Notes Upon inspection patient's wound bed showed evidence of good granulation and epithelization at this point. Fortunately I do not see any evidence of anything worsening which is great overall I think he has been making excellent progress and I think is very close to complete resolution. Compared to where he was there is a tremendous amount of new skin growing. Electronic Signature(s) Signed: 06/26/2021 1:27:55 PM By: Worthy Keeler PA-C Entered By: Worthy Keeler on 06/26/2021 13:27:55 -------------------------------------------------------------------------------- Physician Orders Details Patient Name: Date of Service: [name] 06/26/2021 12:45 PM Medical Record Number: [id number] Patient Account Number: [id number] Date of Birth/Sex: Treating RN: 04-26-1940 (81 y.o. 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 Bathing/ Shower/ Hygiene May shower and wash wound with soap and water. - on days that dressing is changed Edema Control - Lymphedema / SCD / Other Bilateral Lower Extremities Elevate legs to the level of the heart or above for  30 minutes daily and/or when sitting, a frequency of: - throughout the day Avoid standing for long periods of time. Exercise regularly Moisturize legs daily. Compression stocking or Garment 20-30 mm/Hg pressure to: - Juxtalite to both legs daily Wound Treatment Wound #2 - Lower Leg Wound Laterality: Left, Lateral, Distal Cleanser: Soap and Water 3 x Per Week/30 Days Discharge Instructions: May shower and wash wound with dial antibacterial soap and water prior to dressing change. Peri-Wound Care: Zinc Oxide Ointment 30g tube 3 x Per Week/30 Days Discharge Instructions: Apply Zinc Oxide to periwound with each dressing change as needed for maceration Peri-Wound Care: Sween Lotion (Moisturizing lotion) 3 x Per Week/30 Days Discharge Instructions: Apply moisturizing lotion as directed Topical: neosporin 3 x Per Week/30 Days Discharge Instructions: apply three times a week. Secondary Dressing: ABD Pad, 5x9 (Generic) 3 x Per Week/30 Days Discharge Instructions: Apply over primary dressing as directed. Secured With: Elastic Bandage 4 inch (ACE  bandage) 3 x Per Week/30 Days Discharge Instructions: Secure with ACE bandage as directed. Secured With: [employer], 4.5x3.1 (in/yd) (Generic) 3 x Per Week/30 Days Discharge Instructions: Secure with Kerlix as directed. Wound #5 - Lower Leg Wound Laterality: Right, Lateral, Anterior Cleanser: Soap and Water 3 x Per Week/30 Days Discharge Instructions: May shower and wash wound with dial antibacterial soap and water prior to dressing change. Peri-Wound Care: Zinc Oxide Ointment 30g tube 3 x Per Week/30 Days Discharge Instructions: Apply Zinc Oxide to periwound with each dressing change as needed for maceration Peri-Wound Care: Sween Lotion (Moisturizing lotion) 3 x Per Week/30 Days Discharge Instructions: Apply moisturizing lotion as directed Topical: neosporin 3 x Per Week/30 Days Discharge Instructions: apply three times a week. Secondary Dressing: ABD Pad, 5x9 (Generic) 3 x Per Week/30 Days Discharge Instructions: Apply over primary dressing as directed. Secured With: Elastic Bandage 4 inch (ACE bandage) 3 x Per Week/30 Days Discharge Instructions: Secure with ACE bandage as directed. Secured With: [employer], 4.5x3.1 (in/yd) (Generic) 3 x Per Week/30 Days Discharge Instructions: Secure with Kerlix as directed. Electronic Signature(s) Signed: 06/26/2021 5:11:12 PM By: Worthy Keeler PA-C Signed: 06/26/2021 5:24:59 PM By: [name] RN, BSN Entered By: [name] on 06/26/2021 13:25:36 -------------------------------------------------------------------------------- Problem List Details Patient Name: Date of Service: [name], GO RDO [name] 06/26/2021 12:45 PM Medical Record Number: [id number] Patient Account Number: [id number] Date of Birth/Sex: Treating RN: 27-Nov-1940 (81 y.o. 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 09/30/2017 No Yes L97.322 Non-pressure chronic ulcer of left ankle with fat layer exposed 09/30/2017 No Yes L97.822 Non-pressure chronic ulcer of other part of left lower leg with fat layer exposed9/18/2019 No Yes L97.812 Non-pressure chronic ulcer of other part of right lower leg with fat layer 09/30/2017 No Yes exposed I10 Essential (primary) hypertension 09/30/2017 No Yes Inactive Problems Resolved Problems Electronic Signature(s) Signed: 06/26/2021 12:51:48 PM By: Worthy Keeler PA-C Entered By: Worthy Keeler on 06/26/2021 12:51:48 -------------------------------------------------------------------------------- Progress Note Details Patient Name: Date of Service: [name] 06/26/2021 12:45 PM Medical Record Number: [id number] Patient Account Number: [id number] Date of Birth/Sex: Treating RN: 16-Oct-1940 (81 y.o. [name] Primary Care Provider: [name] Other Clinician: Referring Provider: Treating Provider/Extender: [name] in Treatment: 195 Subjective Chief  Complaint Information obtained from Patient Bilateral LE Ulcers History of Present Illness (HPI) 09/30/17 on evaluation today patient presents for initial evaluation and our clinic concerning issues that he has been having with his bilateral lower extremities. He states this has been going on for quite some time at least six months. Currently his regiment has been mainly cleaning the area with peroxide, applying the is foreign ointment, and wrapping the area with ABD pads and then an ace wrap loosely. He has dealt with issues of this nature he tells me for quite some time. He does have a history of having had a compound fracture of the left lower extremity which he thinks also makes this a much more difficult area  for him to heal. He's previously been told that he had poor vascular flow but this was years ago at [facility] we do not have any of those records at this time. He has a history of Epidermolysis Bullosa which was diagnosed around age 70 and he has been cared for at [facility] since that time. Subsequently he states this is hereditary and two of his children one male and one male also have this as well is one of his grandchildren that he is aware of. He has no evidence of infection necessarily at this point although he does have some necrotic tissue noted on the surface of the wound as far as the largest, left lateral lower extremity ulcer, is concerned. Overall I feel like all things considered he's been taking care of this very well. Obviously he has some fairly significant issues going on at this point in this regard. He does have a history otherwise of hypertension though for the most part other than the compound fracture of the left leg he seems to have been fairly healthy in my pinion. 10/07/17 on evaluation today patient actually appears to be doing better in regard to his bilateral lower extremity ulcers. With that being said he does still have some evidence of slough noted on the surface of the wounds I think the Iodoflex has been beneficial for him. His arterial studies are scheduled for October 2. With that being said I do believe that he is continuing to show signs of good improvement which is at least good news. 10/14/17 on evaluation today patient appears to be doing very well in regard to his lower extremity ulcers. He's definitely made some progress as far as healing is concerned although there still are several open areas that are going to need to be addressed. He did have his arterial study today which fortunately shows good findings with a right ABI of 1.23 with a TBI of 0.86 in the left ABI of 1.28 with a TBI of 0.81. This is good news and will allow [place] to perform debridement  as well. 10/23/2017; patient with a large wound on the left lateral calf, sizable area on the left medial malleolus and an area on the right lateral malleolus. He has a new blister consistent with his underlying blistering skin disease just above this area we have been using Iodoflex on the lateral left calf lateral right ankle and collagen on the medial left ankle. We have been using Kerlix Coban wraps 10/28/17 on evaluation today the patient continues to have signs of improvement in regard to the overall appearance of the original wound. Unfortunately he did have some blistering over the right lateral lower extremity which has appeared to rupture on evaluation today and likely some of the dead tissue on the surface  needs to be cleaned away the good news is this does not appear to be to significantly deep at this time. 11/04/17 on evaluation today patient actually appears to be doing a little better in regard to his lower extremity ulcers. He has been tolerating the dressing changes without complication. With that being said he does still have a significant wound especially over the left lateral lower extremity unfortunately. All of the wounds pretty much are going to require sharp debridement today. 11/11/17 on evaluation today patient appears to be doing more poorly in regard to his left lower extremity in particular. There does not appear to be any evidence of systemic infection although the wound itself as far as the larger left lateral lower extremity ulcer actually appears to be infected in my pinion. There's an older and the surface of the wound is dramatically worsened compared to last week. No fevers, chills, nausea, or vomiting noted at this time. 11/18/17 upon evaluation today patient actually appears to be doing better. I did review his culture today which really did not show any specific organism is a positive reason for his wound decline. There are multiple organisms present not  predominant. Nonetheless he seems to be tolerate the doxycycline well in his wounds in general do seem to be doing better. Fortunately there does not appear to be any evidence of infection at this time which is good news. Overall I'm very pleased with how things appear. Nonetheless he still has a lot of healing to go. I do think he could benefit from a Juxta-Lite wrap. 11/25/17 on evaluation today patient actually appears to be doing fairly well in regard to his wounds. He is still taking the antibiotics he has a few days left. Fortunately this seems to have been excellent for him as far as getting the infection control and very happy in this regard. With that being said the patient likewise is also very pleased with how things appear at this time in comparison to where we were he's not having as much pain. 12/02/17 Seen today for follow p and management of LLE wounds. Wounds appear to show some improvement. He denies pain, fever, or chills. Completed a course of doxycycline earlier this month. Scheduled to received Juxta-Lite wrap this week. No s/s of infections. 12/09/17 on evaluation today patient actually appears to be doing a little bit better in regard to his wounds. This is obscene very slow process and unfortunately he has a couple of new areas and this is due to the Epidermolysis Bullosa. Nonetheless I am concerned about the fact that he seems to be getting more areas not less that is the reason we're gonna work on getting schedule for the vascular referral to see the venous specialist. 12/23/17 upon evaluation today patient's wounds currently shows evidence of still not doing quite as well is what I would like to have seen. Subsequently the patient did have his venous study which showed evidence of venous stasis. Subsequently I do think that a vascular evaluation for consideration of venous intervention would be appropriate. I'm not necessarily suggesting that will be anything that can be done  but I think it is at least a good idea. He is in agreement with this plan. 12/30/17 on evaluation today patient actually appears to be doing very well in regard to his wounds when compared to previous evaluation. Subsequently we have been using the [facility] Dressing which actually appears to have done excellent on his left lateral lower extremity ulcer. The quality  of the wound surface is dramatically improved. There is some slight debridement that is going to be required at a couple of locations but overall I'm extremely pleased with how things appear here. 01/07/2018; this is a patient with a primary skin disorder epidermolyis bullosa. Is a large wound on the left lateral calf and smaller wounds on the right however there is a new wound on the right mid tibia area that occurred within the compression wrap that he did not change. We have been using Hydrofera Blue. On the left he is using Hydrofera Blue and Santyl to the inferior part of the wound and changing the dressing himself. 01/15/2018; primary skin disorder epidermolysis bullosa. He has several difficult wounds including the left lateral calf, smaller wounds on the left medial calf and the right lateral calf. The major area on the left lateral calf has a smaller area inferiorly that has necrotic debris we have been using Santyl to this. The rest of the wounds we have been using Hydrofera Blue. The area on the left calf actually looks larger this week. Uncontrolled edema several small open areas above it that are superficial 01/20/18 on evaluation today patient appears to be doing better as compared to last week in regard to his wounds of the bilateral lower extremities. He tolerated the bilateral compression wrap without complication. Overall I'm very pleased with how things appear at this time. The patient likewise is very happy. 01/27/18 on evaluation today patient appears to be doing decently well in regard to his bilateral lower extremity  ulcers. He's been tolerating the dressing changes without complication. One issue he had is that he did have more drainage to the left leg wrapped last week. He states in fact he probably should come in and let us change it on Friday however he just left it in place and kept adding extra absorption with ABD pads to the external portion of the wrap. Unfortunately he does have some aspiration type breakdown nothing significant but I do believe that this was probably counterproductive in general. Nonetheless his wounds do not appear to be terrible overall. 02/03/18 on evaluation today patient appears to be doing rather well in regard to his lower extremity ulcers. He has been tolerating the dressing changes without complication. He does tell me that he had to change the wrap on the left one since we last saw him. Subsequently I do not see any evidence of infection I do feel like the food was much better controlled at this point. 02/10/18 on evaluation today patient appears to be doing rather well in regard to his ulcers. He still has significant alterations especially on the left lateral lower extremity. Fortunately there's no signs of infection at this time. Overall I feel like he is making good progress are some areas that I'm gonna attempt some debridement today. 02/17/18 on evaluation today patient appears to be doing okay in regard to his lower Trinity ulcer. It does appear on both locations he has a little bit of drainage causing some breakdown in maceration around the wound bed's although it doesn't appear to be too bad the right is a little bit worse than left. Fortunately there's no signs of infection which is good news. No fevers, chills, nausea, or vomiting noted at this time. 03/10/18 on evaluation today patient actually appears to be doing rather poorly in regard to his bilateral lower extremity ulcers. The right in particular is draining profusely and the wound is actually enlarging which is not  good.  I'm concerned about both possibly infection and the fact that there's a lot of moisture which is causing breakdown as well. Unfortunately the patient has been trying to change this at home I'm afraid he may need to change more frequently in order to see the improvement that were looking for. There's no signs of systemic infection. 03/17/18 patient actually appears to be doing significantly better at this point in regard to his bilateral lower extremity ulcers. Fortunately there's no signs of infection. That is worsening infection at least indefinitely nothing systemic. With that being said he is having a lot of drainage though not quite as much is there in his last evaluation. Overall feel like he's on the side of improvement. I think if his results back from his culture which showed that he had a positive group B strep along with abundant Pseudomonas noted on the culture. For that reason I am gonna have him continue with the linezolid as we previously have ordered for him and I did go ahead as well today and prescribe Levaquin as well in order to treat the Pseudomonas portion of the infection noted. 03/24/18 on evaluation today patient actually appears to be doing very well in regard to his lower Trinity ulcer. He's been tolerating the dressing changes without complication. Fortunately both legs show signs of less drainage in his edema is very well controlled at this point as well. Overall very pleased with how things seem to be progressing. 03/31/18 on evaluation today patient actually appears to be doing excellent in regard to his bilateral lower extremity ulcers. These are not draining nearly as significant as what they were in the past overall seem to be shown signs of excellent improvement which is great news. Fortunately there is no sign of active infection at this time I do believe that the Levaquin has done extremely well for him in this regard. The patient continues to change these at home  typically every day. We may be able to slowly work towards every other day changes since the drainage seems to be slowing down quite significantly. 04/14/18 on evaluation today patient appears to be doing well in regard to his bilateral lower extremities. Let me [facility] Almost completely healed which is excellent news. Fortunately he's shown signs of improvement all other sites as well with new skin growth there's some slight hyper granular tissue but for the most part this seems to be well maintained with the [facility] Dressing. I'm very happy in this regard. 04/28/18 on evaluation today patient appears to be doing rather well in regard to his ulcers of the bilateral lower extremities all things considering. He continues to make some progress as far as new skin growth. There still some hyper granulation noted at this point despite the use of the [facility] Dressing. This is not terrible but I think we may want to consider conclude cauterization today with silver nitrate to try to help knock some of his back as well as helping with any biofilm on the surface of the wound. 05/12/18 on evaluation today patient's wounds actually appear to be doing fairly well in regard to the bilateral lower extremities. He's been tolerating the dressing changes without complication. Fortunately there's no signs of active infection at this time which is good news. Overall very pleased with how things seem to be progressing. You select silver nitrate was beneficial for him. 05/26/18 on evaluation today patient appears to be doing better in regard to left lower extremity and a little bit worse  in regard to the right lower extremity. He states that he was pulling off the [facility] Dressing peel back some of the skin making this area significantly larger than what it was previous. He's not had any issues other than this and states even that hasn't caused any pain he just seems to obviously have a much larger area  on the right when compared to the previous time I saw him. No fevers, chills, nausea, or vomiting noted at this time. 06/16/18 on evaluation today patient actually appears to be doing a little better in my pinion in regard to his lower summary ulcers. He has new skin islands that seem to be spreading which is good news. Fortunately there's no signs of active infection at this time. His biggest issue is he tells me that coming as often as he does is becoming very cost prohibitive. He wonders if we can potentially spread this out. 07/14/18 on evaluation today patient appears to be doing a little bit worse in regard to his lower from the ulcer. Unfortunately he still continues to have a significant amount of drainage I think we need to do something to try to help this more. He is still somewhat reluctant to go the route of the Wound VAC although that may be the most appropriate thing for him. No fevers, chills, nausea, or vomiting noted at this time. 08/18/2018 on evaluation today patient actually appears to be doing quite well with regard to his bilateral lower extremity ulcers. I do feel like that currently he is making great progress the care max does seem to be doing a great job at helping to control the moisture he has no maceration or skin breakdown. Again this seems to be an excellent way to go. 1 thing we may want to change is adding collagen to the base of the wound and then the care max over top he is not opposed to this. 09/15/2018 on evaluation today patient appears to be doing well with regard to his bilateral lower extremity ulcers. He is showing some signs of improvement not necessarily in size but definitely in appearance. In fact he has a lot of new skin growing throughout the wounds along the edges as well as in the central portion of the wounds on both lower extremities. Overall I am extremely pleased to see this. 10/20/2018 on evaluation today patient actually appears to be doing quite well  with regard to his wounds. They are not measuring significantly smaller but he does have a lot of new epithelization noted as compared to previous. Fortunately there is no signs of active infection at this time. No fevers, chills, nausea, vomiting, or diarrhea. 11/17/2018 on evaluation today patient presents for reevaluation concerning his bilateral lower extremity ulcers. Fortunately there is no signs of active infection at this time today. He has been tolerating the dressing changes without complication. No fevers, chills, nausea, vomiting, or diarrhea. Unfortunately in general the patient has not made as much improvement as I would like to have seen up to this point. He has been tolerating the dressing changes without complication and he does an excellent job taking care of his wounds at home in my opinion. The biggest issue I see is that he is just not making the progress that we need to be seeing currently. I think we may want to consider having him seen at a plastic surgery appointment and he has previously seen someone in years past at [facility] in [place]. That is  definitely a possibility for [place] to look into at this point. 12/29/2018 on evaluation today patient appears to be doing better in regard to the overall visual appearance of his wounds which do not appear to be as macerated. He does have a much larger skin island in the middle of the left lower extremity ulcer which is doing much better. He tells me the pain is also significantly better. With that being said overall his improvement as far as the size of the wounds is not better but again these are very irregular in change shape quite often. Fortunately there is no evidence of active infection at this time which is great news. He never heard anything from [facility] regarding the plastic surgery referral that we made to them. 01/26/2019 upon evaluation today patient appears to be doing a little better in regard to his  wounds today. He has been tolerating the dressing changes again he performs these for the most part on his own. He does a great job wrapping his legs in my opinion. Unfortunately he has not been able to get down to [facility] to see if there is anything from a plastic surgery standpoint that could be done to help with his legs simply due to the fact that his wife unfortunately sustained a compression fracture in her spine she is seeing Dr. Saintclair Halsted and subsequently is going to be having what sounds to be a kyphoplasty type procedure. With that being said that has not been scheduled yet there is still waiting on an MRI the patient is very busy in fact overly busy trying to help take care of his wife at this point. I completely understand this is more of a strain on him at this time 02/23/2019 upon evaluation today patient actually appears to be making some progress. I am actually very pleased with the overall appearance of his wounds even compared to last evaluation. He seems to be doing quite well. He is taking care of his wife unfortunately she did have a compression fracture she has had the procedure for this but still she has a slow road to recovery. For that reason he still not gone to [facility] for a second opinion in this regard. Obviously the goal there was if there was anything that can be done from a skin graft standpoint or otherwise. 03/23/2019 upon evaluation today patient continues to have issues with lower extremity ulcers. Since the beginning he has made progress but at the same time the wounds unfortunately just will not close. We have been trying to get the patient to [facility] to see a specialist there but unfortunately with the everything going on with his wife he has not been able to make that appointment time yet he states he may be able to in the next 1-2 months but is not really sure. 04/27/2019 on evaluation today patient appears to be doing a little bit more  poorly. His last evaluation. He appears to have some erythema around the edges of the wound at this point. Fortunately there is no signs of active infection at this time which is good news. No fevers, chills, nausea, vomiting, or diarrhea. 06/08/2019 on evaluation today patient appears to be doing well with regard to his wounds. Overall they are actually measuring smaller compared to the last visit last month. We did treat him for Pseudomonas as well as methicillin-resistant Staph aureus. He was only on the treatment for MRSA however for 7 days as the Cipro was resistant and  subsequently we had to place him on doxycycline. Nonetheless I am thinking that we may want to add the doxycycline and just do a month-long treatment considering the longstanding nature of his wounds and see if we get this under better control. 07/13/2019 upon evaluation today patient appears to be doing fairly well in regard to his bilateral lower extremities. There does not appear to be any signs of active infection which is good news. No fevers, chills, nausea, vomiting, or diarrhea. 08/10/2019 upon evaluation today patient appears to be doing about the same with regard to his wounds in general. Unfortunately he is not significantly better although is also not significantly worse which is great news there is no evidence of active infection at this time which is good news. He still dealing with a lot going on with his wife and therefore is not really able to go see anyone at the specialty clinic at [facility] that we have previously set up still. 09/07/2019 on evaluation today patient appears to be doing well with regard to his wounds. In fact this is probably the best that have seen so far in quite a few months. Overall I am very pleased with where things stand at this time. No fevers, chills, nausea, vomiting, or diarrhea. 10/05/2019 upon evaluation today patient appears to be doing more poorly in regard to his legs at this point. He  actually is showing some signs of infection. This has been something that we seem to be back-and-forth with as far as trying to keep these wounds from becoming infected. He takes care of them very well in my opinion but nonetheless I am concerned in this regard. He tells me that his wife is still doing really about the same she is slowly getting better. Nonetheless he still spends the majority of his time helping to take care of her. 11/02/2019 upon evaluation today patient actually appears to be doing somewhat better in regard to his legs. I do believe that the compounded antibiotic treatment from [facility] has been beneficial for him. Overall I am extremely pleased with where things stand today. There is no signs of active infection at this time. The patient states he has much less drainage than he has in the past. 12/07/2019 upon evaluation today patient appears to be doing well at this time in regard to the overall appearance of his wound bed. Currently there is no signs of active infection at this time. 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 Especially when he stressed. Fortunately there is no signs of active infection locally or systemically at this point. 01/18/2020 on evaluation today patient appears to be doing well with regard to his leg ulcers. He has been tolerating the dressing changes without complication. Fortunately there is no signs of infection and overall I feel like his legs are doing about the best they have done in quite some time. There does not appear to be any evidence of active infection which is great news and overall very pleased. 02/29/2020 upon evaluation today patient's wounds actually appear to be doing quite well currently. There is no sign of active infection at this time. No fevers, chills, nausea,  vomiting, or diarrhea. 04/11/2025 on evaluation today patient appears to be doing excellent in regard to his wounds on the legs to be honest. He has made  a lot of progress there does not appear to be any signs of active infection and overall I think that he is better than previous although still the wounds are quite significant obviously. In general I think that he is pleased with where things stand at this point. Still there is quite a bit of work to do here. 07/13/2020 upon evaluation today patient appears to be doing well with regard to his wound. Is been tolerating the dressing changes without complication on both legs. He just has the 2 main wounds on each leg at the ankle on the medial region of the right leg is completely healed. His wounds do seem to have gotten better during the time that he has been on bedrest as result of the pelvis fracture. Obviously does not the way that we wanted to see things improved but nonetheless he tells me that it is what it is. Fortunately he does seem to be doing better he tells me that her daughter from the [place] is actually down helping to take care of them out as he and his wife while he is recovering. 08/08/2020 upon evaluation today patient appears to be doing well currently in regard to his leg ulcers. He has been tolerating the dressing changes without complication. Fortunately there is no signs of active infection at this time. No fevers, chills, nausea, vomiting, or diarrhea. 09/26/2020 upon evaluation today patient appears to be doing well with regard to his wound. He has been showing signs of good improvement which is great news and overall I am extremely pleased with where things stand today. I do think that he does well with his dressing changes and overall has really improved significantly here. segment how that how she doing 11/14/2020 upon evaluation today patient appears to be doing well with regard to his leg ulcers. He has been tolerating the  dressing changes without complication. Fortunately there does not appear to be any signs of active infection at this time. No fevers, chills, nausea, vomiting, or diarrhea. 12/26/2020 upon evaluation today patient actually appears to be making some good progress here in regard to his wounds. Fortunately I do not see any signs of active infection which is great news. No fevers, chills, nausea, vomiting, or diarrhea. 02/06/2021 upon evaluation today patient appears to be doing well with regard to his wound currently. He has been tolerating the dressing changes without complication. Fortunately there does not appear to be any evidence of active infection locally nor systemically at this time. No fevers, chills, nausea, vomiting, or diarrhea. Overall I think that this is very slow but does seem to be getting a little bit better and a little smaller he has been through a lot of stress recently with his wife she is having a heart evaluation tomorrow to see whether or not an aortic valve replacement is something that she could tolerate and undergo. 04/03/2020 upon evaluation today patient's legs are actually showing signs of significant improvement. This is actually a dramatic overall improvement since I last saw him I think we are getting very close to closing these wounds. That is an amazing thing is we been dealing with this for quite some time. The patient is very pleased to hear things appear to be doing well as well. 05-15-2021 upon evaluation today patient appears to be doing well with regard to his wound on the bilateral lower extremities. Both are showing signs of excellent improvement which is great news. There does not appear to be any signs of active  infection locally nor systemically at this time. No fevers, chills, nausea, vomiting, or diarrhea. 06-26-2021 upon evaluation today patient appears to be doing well with regard to his wounds on the legs. They are actually showing signs of improvement in  my opinion which is great news. Fortunately I do not see any evidence of active infection locally or systemically which is great news. No fevers, chills, nausea, vomiting, or diarrhea. Objective Constitutional Well-nourished and well-hydrated in no acute distress. Vitals Time Taken: 12:52 PM, Height: 71 in, Weight: 220 lbs, BMI: 30.7, Temperature: 97.7 F, Pulse: 71 bpm, Respiratory Rate: 18 breaths/min, Blood Pressure: 166/73 mmHg. Respiratory normal breathing without difficulty. Psychiatric this patient is able to make decisions and demonstrates good insight into disease process. Alert and Oriented x 3. pleasant and cooperative. General Notes: Upon inspection patient's wound bed showed evidence of good granulation and epithelization at this point. Fortunately I do not see any evidence of anything worsening which is great overall I think he has been making excellent progress and I think is very close to complete resolution. Compared to where he was there is a tremendous amount of new skin growing. Integumentary (Hair, Skin) Wound #2 status is Open. Original cause of wound was Gradually Appeared. The date acquired was: 03/13/2017. The wound has been in treatment 195 weeks. The wound is located on the Left,Distal,Lateral Lower Leg. The wound measures 8.7cm length x 6.6cm width x 0.1cm depth; 45.098cm^2 area and 4.51cm^3 volume. There is Fat Layer (Subcutaneous Tissue) exposed. There is no tunneling or undermining noted. There is a large amount of serosanguineous drainage noted. The wound margin is distinct with the outline attached to the wound base. There is large (67-100%) red granulation within the wound bed. There is a small (1-33%) amount of necrotic tissue within the wound bed including Adherent Slough. Wound #5 status is Open. Original cause of wound was Gradually Appeared. The date acquired was: 10/26/2017. The wound has been in treatment 191 weeks. The wound is located on the  Right,Lateral,Anterior Lower Leg. The wound measures 3.1cm length x 4.4cm width x 0.1cm depth; 10.713cm^2 area and 1.071cm^3 volume. There is Fat Layer (Subcutaneous Tissue) exposed. There is no tunneling or undermining noted. There is a medium amount of serosanguineous drainage noted. The wound margin is distinct with the outline attached to the wound base. There is large (67-100%) red granulation within the wound bed. There is no necrotic tissue within the wound bed. 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 Bathing/ Shower/ Hygiene: May shower and wash wound with soap and water. - on days that dressing is changed Edema Control - Lymphedema / SCD / Other: Elevate legs to the level of the heart or above for 30 minutes daily and/or when sitting, a frequency of: - throughout the day Avoid standing for long periods of time. Exercise regularly Moisturize legs daily. Compression stocking or Garment 20-30 mm/Hg pressure to: - Juxtalite to both legs daily WOUND #2: - Lower Leg Wound Laterality: Left, Lateral, Distal Cleanser: Soap and Water 3 x Per Week/30 Days Discharge Instructions: May  shower and wash wound with dial antibacterial soap and water prior to dressing change. Peri-Wound Care: Zinc Oxide Ointment 30g tube 3 x Per Week/30 Days Discharge Instructions: Apply Zinc Oxide to periwound with each dressing change as needed for maceration Peri-Wound Care: Sween  Lotion (Moisturizing lotion) 3 x Per Week/30 Days Discharge Instructions: Apply moisturizing lotion as directed Topical: neosporin 3 x Per Week/30 Days Discharge Instructions: apply three times a week. Secondary Dressing: ABD Pad, 5x9 (Generic) 3 x Per Week/30 Days Discharge Instructions: Apply over primary dressing as directed. Secured With: Elastic Bandage 4 inch (ACE bandage) 3 x Per Week/30 Days Discharge Instructions: Secure with ACE bandage as directed. Secured With: [employer], 4.5x3.1 (in/yd) (Generic) 3 x Per Week/30 Days Discharge Instructions: Secure with Kerlix as directed. WOUND #5: - Lower Leg Wound Laterality: Right, Lateral, Anterior Cleanser: Soap and Water 3 x Per Week/30 Days Discharge Instructions: May shower and wash wound with dial antibacterial soap and water prior to dressing change. Peri-Wound Care: Zinc Oxide Ointment 30g tube 3 x Per Week/30 Days Discharge Instructions: Apply Zinc Oxide to periwound with each dressing change as needed for maceration Peri-Wound Care: Sween Lotion (Moisturizing lotion) 3 x Per Week/30 Days Discharge Instructions: Apply moisturizing lotion as directed Topical: neosporin 3 x Per Week/30 Days Discharge Instructions: apply three times a week. Secondary Dressing: ABD Pad, 5x9 (Generic) 3 x Per Week/30 Days Discharge Instructions: Apply over primary dressing as directed. Secured With: Elastic Bandage 4 inch (ACE bandage) 3 x Per Week/30 Days Discharge Instructions: Secure with ACE bandage as directed. Secured With: [employer], 4.5x3.1 (in/yd) (Generic) 3 x Per Week/30 Days Discharge Instructions: Secure with Kerlix as directed. 1. I would recommend based on our conversation today I Georgina Peer have him just use the antibiotic ointment over his legs as he does with most of the epicondylosis below so that pop up. He tells me that generally that does well and these are so close to healing he thinks that would be appropriate right  now I am definitely willing to give it a shot he does so well taking care of his legs on a normal basis. 2. Also can recommend an ABD pad to cover. 3. I am also can recommend roll gauze and secured in place and then an Ace bandage to hold in place he is using his juxta lite wraps over top of that. We will see patient back for reevaluation in 1 week here in the clinic. If anything worsens or changes patient will contact our office for additional recommendations. Electronic Signature(s) Signed: 06/26/2021 1:28:35 PM By: Worthy Keeler PA-C Entered By: Worthy Keeler on 06/26/2021 13:28:35 -------------------------------------------------------------------------------- SuperBill Details Patient Name: Date of Service: [name], [place]. 06/26/2021 Medical Record Number: [id number] Patient Account Number: [id number] Date of Birth/Sex: Treating RN: 1940/06/08 (81 y.o. [name] Primary Care Provider: [name] Other Clinician: Referring Provider: Treating Provider/Extender: [name] in Treatment: 195 Diagnosis Coding ICD-10 Codes Code Description I87.2 Venous insufficiency (chronic) (peripheral) Q81.8 Other epidermolysis bullosa L97.322 Non-pressure chronic ulcer of left ankle with fat layer exposed L97.822 Non-pressure chronic ulcer of other part of left lower leg with fat layer exposed L97.812 Non-pressure chronic ulcer of other part of right lower leg with fat layer exposed [place] (primary) hypertension Facility Procedures CPT4 Code: [id number] Description: 49702 - WOUND CARE VISIT-LEV 5 EST PT Modifier: Quantity: 1 Physician Procedures : CPT4 Code Description Modifier [id number] - WC PHYS LEVEL  3 - EST PT ICD-10 Diagnosis Description I87.2 Venous insufficiency (chronic) (peripheral) Q81.8 Other epidermolysis bullosa L97.322 Non-pressure chronic ulcer of left ankle with fat layer  exposed L97.822 Non-pressure chronic ulcer of other part of  left lower leg with fat layer exposed Quantity: 1 Electronic Signature(s) Signed: 06/26/2021 1:28:48 PM By: Worthy Keeler PA-C Entered By: Worthy Keeler on 06/26/2021 13:28:48

## 2021-06-28 NOTE — Progress Notes (Signed)
Carlos White, Carlos White ([id number]) Visit Report for 06/26/2021 Arrival Information Details Patient Name: Date of Service: Carlos White 06/26/2021 12:45 PM Medical Record Number: [id number] Patient Account Number: [id number] Date of Birth/Sex: Treating RN: 28-Jan-1940 (81 y.o. [name], [name] Primary Care Jancarlos Thrun: [name] Other Clinician: Referring Reba Hulett: Treating Naysha Sholl/Extender: [name] in Treatment: 21 Visit Information History Since Last Visit Added or deleted any medications: No Patient Arrived: Ambulatory Any new allergies or adverse reactions: No Arrival Time: 12:50 Had a fall or experienced change in No Accompanied By: self activities of daily living that may affect Transfer Assistance: None risk of falls: Patient Identification Verified: Yes Signs or symptoms of abuse/neglect since last visito No Secondary Verification Process Completed: Yes Hospitalized since last visit: No Patient Requires Transmission-Based Precautions: No Implantable device outside of the clinic excluding No Patient Has Alerts: Yes cellular tissue based products placed in the center Patient Alerts: R ABI= 1.23, TBI = .86 since last visit: L ABI= 1.28, TBI=.81 Has Dressing in Place as Prescribed: Yes Pain Present Now: No Electronic Signature(s) Signed: 06/28/2021 11:45:44 AM By: Erenest Blank Entered By: Erenest Blank on 06/26/2021 12:51:07 -------------------------------------------------------------------------------- Clinic Level of Care Assessment Details Patient Name: Date of Service: Carlos White 06/26/2021 12:45 PM Medical Record Number: [id number] Patient Account Number: [id number] Date of Birth/Sex: Treating RN: 07-06-1940 (81 y.o. Carlos White Primary Care Nickie Warwick: [name] Other Clinician: Referring Tatia Petrucci: Treating Rosaleen Mazer/Extender: [name] in Treatment: [facility] Level of Care Assessment Items TOOL  4 Quantity Score X- 1 0 Use when only an EandM is performed on FOLLOW-UP visit ASSESSMENTS - Nursing Assessment / Reassessment X- 1 10 Reassessment of Co-morbidities (includes updates in patient status) X- 1 5 Reassessment of Adherence to Treatment Plan ASSESSMENTS - Wound and Skin A ssessment / Reassessment []  - 0 Simple Wound Assessment / Reassessment - one wound X- 2 5 Complex Wound Assessment / Reassessment - multiple wounds X- 1 10 Dermatologic / Skin Assessment (not related to wound area) ASSESSMENTS - Focused Assessment X- 2 5 Circumferential Edema Measurements - multi extremities []  - 0 Nutritional Assessment / Counseling / Intervention []  - 0 Lower Extremity Assessment (monofilament, tuning fork, pulses) []  - 0 Peripheral Arterial Disease Assessment (using hand held doppler) ASSESSMENTS - Ostomy and/or Continence Assessment and Care []  - 0 Incontinence Assessment and Management []  - 0 Ostomy Care Assessment and Management (repouching, etc.) PROCESS - Coordination of Care []  - 0 Simple Patient / Family Education for ongoing care X- 1 20 Complex (extensive) Patient / Family Education for ongoing care X- 1 10 Staff obtains Programmer, systems, Records, T Results / Process Orders est []  - 0 Staff telephones HHA, Nursing Homes / Clarify orders / etc []  - 0 Routine Transfer to another Facility (non-emergent condition) []  - 0 Routine Hospital Admission (non-emergent condition) []  - 0 New Admissions / Biomedical engineer / Ordering NPWT Apligraf, etc. , []  - 0 Emergency Hospital Admission (emergent condition) []  - 0 Simple Discharge Coordination X- 1 15 Complex (extensive) Discharge Coordination PROCESS - Special Needs []  - 0 Pediatric / Minor Patient Management []  - 0 Isolation Patient Management []  - 0 Hearing / Language / Visual special needs []  - 0 Assessment of Community assistance (transportation, D/C planning, etc.) []  - 0 Additional assistance /  Altered mentation []  - 0 Support Surface(s) Assessment (bed, cushion, seat, etc.) INTERVENTIONS - Wound Cleansing / Measurement []  - 0 Simple Wound  Cleansing - one wound X- 2 5 Complex Wound Cleansing - multiple wounds X- 1 5 Wound Imaging (photographs - any number of wounds) []  - 0 Wound Tracing (instead of photographs) []  - 0 Simple Wound Measurement - one wound X- 2 5 Complex Wound Measurement - multiple wounds INTERVENTIONS - Wound Dressings []  - 0 Small Wound Dressing one or multiple wounds []  - 0 Medium Wound Dressing one or multiple wounds X- 2 20 Large Wound Dressing one or multiple wounds X- 1 5 Application of Medications - topical []  - 0 Application of Medications - injection INTERVENTIONS - Miscellaneous []  - 0 External ear exam []  - 0 Specimen Collection (cultures, biopsies, blood, body fluids, etc.) []  - 0 Specimen(s) / Culture(s) sent or taken to Lab for analysis []  - 0 Patient Transfer (multiple staff / Civil Service fast streamer / Similar devices) []  - 0 Simple Staple / Suture removal (25 or less) []  - 0 Complex Staple / Suture removal (26 or more) []  - 0 Hypo / Hyperglycemic Management (close monitor of Blood Glucose) []  - 0 Ankle / Brachial Index (ABI) - do not check if billed separately X- 1 5 Vital Signs Has the patient been seen at the hospital within the last three years: Yes Total Score: 165 Level Of Care: New/Established - Level 5 Electronic Signature(s) Signed: 06/26/2021 5:24:59 PM By: [name] RN, BSN Entered By: [name] on 06/26/2021 13:26:44 -------------------------------------------------------------------------------- Encounter Discharge Information Details Patient Name: Date of Service: [name], GO RDO Carlos White. 06/26/2021 12:45 PM Medical Record Number: [id number] Patient Account Number: [id number] Date of Birth/Sex: Treating RN: Jun 30, 1940 (81 y.o. Carlos White Primary Care Gerritt Galentine: [name] Other Clinician: Referring  Antonisha Waskey: Treating Mandisa Persinger/Extender: [name] in Treatment: [phone] Encounter Discharge Information Items Discharge Condition: Stable Ambulatory Status: Ambulatory Discharge Destination: Home Transportation: Private Auto Accompanied By: self Schedule Follow-up Appointment: Yes Clinical Summary of Care: Electronic Signature(s) Signed: 06/26/2021 5:24:59 PM By: [name] RN, BSN Entered By: [name] on 06/26/2021 13:27:12 -------------------------------------------------------------------------------- Lower Extremity Assessment Details Patient Name: Date of Service: [name] RDO Carlos White. 06/26/2021 12:45 PM Medical Record Number: [id number] Patient Account Number: [id number] Date of Birth/Sex: Treating RN: May 02, 1940 (81 y.o. Carlos White Primary Care Tamre Cass: [name] Other Clinician: Referring Bettyanne Dittman: Treating Circe Chilton/Extender: [name] in Treatment: 195 Edema Assessment Assessed: [Left: Yes] Patrice Paradise: Yes] Edema: [Left: Yes] [Right: Yes] Calf Left: Right: Point of Measurement: 35.5 cm From Medial Instep 37.5 cm 37.5 cm Ankle Left: Right: Point of Measurement: 11 cm From Medial Instep 25.5 cm 22.7 cm Vascular Assessment Pulses: Dorsalis Pedis Palpable: [Left:Yes] [Right:Yes] Electronic Signature(s) Signed: 06/26/2021 5:24:59 PM By: [name] RN, BSN Signed: 06/28/2021 11:45:44 AM By: Erenest Blank Entered By: Erenest Blank on 06/26/2021 13:04:51 -------------------------------------------------------------------------------- Multi-Disciplinary Care Plan Details Patient Name: Date of Service: [name], GO RDO N H. 06/26/2021 12:45 PM Medical Record Number: [id number] Patient Account Number: [id number] Date of Birth/Sex: Treating RN: 02/22/1940 (81 y.o. Carlos White Primary Care Devery Murgia: [name] Other Clinician: Referring Kataryna Mcquilkin: Treating Ky Rumple/Extender: [name]  in Treatment: Adeline reviewed with physician Active Inactive Venous Leg Ulcer Nursing Diagnoses: Knowledge deficit related to disease process and management Potential for venous Insuffiency (use before diagnosis confirmed) Goals: Patient will maintain optimal edema control Date Initiated: 09/30/2017 Target Resolution Date: 09/13/2021 Goal Status: Active Patient/caregiver will verbalize understanding of disease process and disease management Date Initiated: 09/30/2017 Date Inactivated: 11/04/2017  Target Resolution Date: 10/30/2017 Goal Status: Met Interventions: Assess peripheral edema status every visit. Provide education on venous insufficiency Notes: Wound/Skin Impairment Nursing Diagnoses: Impaired tissue integrity Knowledge deficit related to ulceration/compromised skin integrity Goals: Patient/caregiver will verbalize understanding of skin care regimen Date Initiated: 09/30/2017 Target Resolution Date: 09/13/2021 Goal Status: Active Ulcer/skin breakdown will have a volume reduction of 30% by week 4 Date Initiated: 09/30/2017 Date Inactivated: 11/04/2017 Target Resolution Date: 10/30/2017 Goal Status: Met Interventions: Assess patient/caregiver ability to obtain necessary supplies Assess patient/caregiver ability to perform ulcer/skin care regimen upon admission and as needed Assess ulceration(s) every visit Provide education on ulcer and skin care Notes: Electronic Signature(s) Signed: 06/26/2021 5:24:59 PM By: [name] RN, BSN Entered By: [name] on 06/26/2021 13:25:49 -------------------------------------------------------------------------------- Pain Assessment Details Patient Name: Date of Service: [name], GO RDO Carlos White. 06/26/2021 12:45 PM Medical Record Number: [id number] Patient Account Number: [id number] Date of Birth/Sex: Treating RN: 1940-04-10 (81 y.o. Carlos White Primary Care Brithany Whitworth: [name] Other  Clinician: Referring Cheyenna Pankowski: Treating Briant Angelillo/Extender: [name] in Treatment: [phone] Active Problems Location of Pain Severity and Description of Pain Patient Has Paino No Site Locations Pain Management and Medication Current Pain Management: Electronic Signature(s) Signed: 06/26/2021 5:24:59 PM By: [name] RN, BSN Signed: 06/28/2021 11:45:44 AM By: Erenest Blank Entered By: Erenest Blank on 06/26/2021 12:52:48 -------------------------------------------------------------------------------- Patient/Caregiver Education Details Patient Name: Date of Service: Carlos White 6/14/2023andnbsp12:45 PM Medical Record Number: [id number] Patient Account Number: [id number] Date of Birth/Gender: Treating RN: 04-02-1940 (81 y.o. Carlos White Primary Care Physician: [name] Other Clinician: Referring Physician: Treating Physician/Extender: [name] in Treatment: 86 Education Assessment Education Provided To: Patient Education Topics Provided Wound/Skin Impairment: Handouts: Skin Care Do's and Dont's Methods: Explain/Verbal Responses: Reinforcements needed Electronic Signature(s) Signed: 06/26/2021 5:24:59 PM By: [name] RN, BSN Entered By: [name] on 06/26/2021 13:26:00 -------------------------------------------------------------------------------- Wound Assessment Details Patient Name: Date of Service: [name] RDO Carlos White. 06/26/2021 12:45 PM Medical Record Number: [id number] Patient Account Number: [id number] Date of Birth/Sex: Treating RN: [birth year] (81 y.o. Carlos White Primary Care Kc Sedlak: [name] Other Clinician: Referring Ngozi Alvidrez: Treating Lonzo Saulter/Extender: [name] in Treatment: 195 Wound Status Wound Number: 2 Primary Venous Leg Ulcer Etiology: Wound Location: Left, Distal, Lateral Lower Leg Wound Status: Open Wounding Event: Gradually  Appeared Comorbid Cataracts, Chronic Obstructive Pulmonary Disease (COPD), Date Acquired: 03/13/2017 History: Hypertension Weeks Of Treatment: 195 Clustered Wound: Yes Photos Wound Measurements Length: (cm) 8.7 Width: (cm) 6.6 Depth: (cm) 0.1 Clustered Quantity: 2 Area: (cm) 45 Volume: (cm) 4. % Reduction in Area: 41.8% % Reduction in Volume: 88.4% Epithelialization: Medium (34-66%) Tunneling: No .098 Undermining: No 51 Wound Description Classification: Full Thickness Without Exposed Support Stru Wound Margin: Distinct, outline attached Exudate Amount: Large Exudate Type: Serosanguineous Exudate Color: red, brown ctures Foul Odor After Cleansing: No Slough/Fibrino Yes Wound Bed Granulation Amount: Large (67-100%) Exposed Structure Granulation Quality: Red Fascia Exposed: No Necrotic Amount: Small (1-33%) Fat Layer (Subcutaneous Tissue) Exposed: Yes Necrotic Quality: Adherent Slough Tendon Exposed: No Muscle Exposed: No Joint Exposed: No Bone Exposed: No Treatment Notes Wound #2 (Lower Leg) Wound Laterality: Left, Lateral, Distal Cleanser Soap and Water Discharge Instruction: May shower and wash wound with dial antibacterial soap and water prior to dressing change. Peri-Wound Care Zinc Oxide Ointment 30g tube Discharge Instruction: Apply Zinc Oxide to periwound with each dressing change as needed for maceration Sween Lotion (  Moisturizing lotion) Discharge Instruction: Apply moisturizing lotion as directed Topical neosporin Discharge Instruction: apply three times a week. Primary Dressing Secondary Dressing ABD Pad, 5x9 Discharge Instruction: Apply over primary dressing as directed. Secured With Elastic Bandage 4 inch (ACE bandage) Discharge Instruction: Secure with ACE bandage as directed. Kerlix Roll Sterile, 4.5x3.1 (in/yd) Discharge Instruction: Secure with Kerlix as directed. Compression Wrap Compression Stockings Add-Ons Electronic  Signature(s) Signed: 06/26/2021 5:24:59 PM By: [name] RN, BSN Signed: 06/28/2021 11:45:44 AM By: Erenest Blank Entered By: Erenest Blank on 06/26/2021 13:02:19 -------------------------------------------------------------------------------- Wound Assessment Details Patient Name: Date of Service: [name], GO RDO Carlos White 06/26/2021 12:45 PM Medical Record Number: [id number] Patient Account Number: [id number] Date of Birth/Sex: Treating RN: 1941-01-14 (81 y.o. Carlos White Primary Care Temeca Somma: [name] Other Clinician: Referring Katalina Magri: Treating Trude Cansler/Extender: [name] in Treatment: 195 Wound Status Wound Number: 5 Primary Vasculopathy Etiology: Wound Location: Right, Lateral, Anterior Lower Leg Wound Status: Open Wounding Event: Gradually Appeared Comorbid Cataracts, Chronic Obstructive Pulmonary Disease (COPD), Date Acquired: 10/26/2017 History: Hypertension Weeks Of Treatment: 191 Clustered Wound: Yes Photos Wound Measurements Length: (cm) 3.1 Width: (cm) 4.4 Depth: (cm) 0.1 Clustered Quantity: 1 Area: (cm) 10.713 Volume: (cm) 1.071 % Reduction in Area: 80.3% % Reduction in Volume: 80.3% Epithelialization: Large (67-100%) Tunneling: No Undermining: No Wound Description Classification: Full Thickness Without Exposed Support Stru Wound Margin: Distinct, outline attached Exudate Amount: Medium Exudate Type: Serosanguineous Exudate Color: red, brown ctures Foul Odor After Cleansing: No Slough/Fibrino Yes Wound Bed Granulation Amount: Large (67-100%) Exposed Structure Granulation Quality: Red Fascia Exposed: No Necrotic Amount: None Present (0%) Fat Layer (Subcutaneous Tissue) Exposed: Yes Tendon Exposed: No Muscle Exposed: No Joint Exposed: No Bone Exposed: No Treatment Notes Wound #5 (Lower Leg) Wound Laterality: Right, Lateral, Anterior Cleanser Soap and Water Discharge Instruction: May shower and wash wound  with dial antibacterial soap and water prior to dressing change. Peri-Wound Care Zinc Oxide Ointment 30g tube Discharge Instruction: Apply Zinc Oxide to periwound with each dressing change as needed for maceration Sween Lotion (Moisturizing lotion) Discharge Instruction: Apply moisturizing lotion as directed Topical neosporin Discharge Instruction: apply three times a week. Primary Dressing Secondary Dressing ABD Pad, 5x9 Discharge Instruction: Apply over primary dressing as directed. Secured With Elastic Bandage 4 inch (ACE bandage) Discharge Instruction: Secure with ACE bandage as directed. Kerlix Roll Sterile, 4.5x3.1 (in/yd) Discharge Instruction: Secure with Kerlix as directed. Compression Wrap Compression Stockings Add-Ons Electronic Signature(s) Signed: 06/26/2021 5:24:59 PM By: [name] RN, BSN Signed: 06/28/2021 11:45:44 AM By: Erenest Blank Entered By: Erenest Blank on 06/26/2021 13:03:15 -------------------------------------------------------------------------------- Vitals Details Patient Name: Date of Service: [name], GO RDO N H. 06/26/2021 12:45 PM Medical Record Number: [id number] Patient Account Number: [id number] Date of Birth/Sex: Treating RN: 19-May-1940 (81 y.o. Carlos White Primary Care Aliyha Fornes: [name] Other Clinician: Referring Ordell Prichett: Treating Christi Wirick/Extender: [name] in Treatment: 195 Vital Signs Time Taken: 12:52 Temperature (F): 97.7 Height (in): 71 Pulse (bpm): 71 Weight (lbs): 220 Respiratory Rate (breaths/min): 18 Body Mass Index (BMI): 30.7 Blood Pressure (mmHg): 166/73 Reference Range: 80 - 120 mg / dl Electronic Signature(s) Signed: 06/28/2021 11:45:44 AM By: Erenest Blank Entered By: Erenest Blank on 06/26/2021 12:52:40

## 2021-07-17 ENCOUNTER — Encounter: Payer: Self-pay | Admitting: Internal Medicine

## 2021-07-17 DIAGNOSIS — H04123 Dry eye syndrome of bilateral lacrimal glands: Secondary | ICD-10-CM | POA: Diagnosis not present

## 2021-07-17 DIAGNOSIS — H35033 Hypertensive retinopathy, bilateral: Secondary | ICD-10-CM | POA: Diagnosis not present

## 2021-07-17 DIAGNOSIS — H353132 Nonexudative age-related macular degeneration, bilateral, intermediate dry stage: Secondary | ICD-10-CM | POA: Diagnosis not present

## 2021-07-17 DIAGNOSIS — H43813 Vitreous degeneration, bilateral: Secondary | ICD-10-CM | POA: Diagnosis not present

## 2021-07-17 DIAGNOSIS — H524 Presbyopia: Secondary | ICD-10-CM | POA: Diagnosis not present

## 2021-07-17 LAB — HM DIABETES EYE EXAM

## 2021-07-18 ENCOUNTER — Encounter: Payer: Self-pay | Admitting: Internal Medicine

## 2021-08-01 ENCOUNTER — Other Ambulatory Visit: Payer: Self-pay | Admitting: Internal Medicine

## 2021-08-07 ENCOUNTER — Ambulatory Visit (HOSPITAL_BASED_OUTPATIENT_CLINIC_OR_DEPARTMENT_OTHER): Payer: Medicare PPO | Admitting: Internal Medicine

## 2021-08-08 ENCOUNTER — Other Ambulatory Visit: Payer: Self-pay | Admitting: Internal Medicine

## 2021-08-08 ENCOUNTER — Telehealth: Payer: Self-pay | Admitting: Internal Medicine

## 2021-08-08 ENCOUNTER — Other Ambulatory Visit: Payer: Self-pay

## 2021-08-08 MED ORDER — LOSARTAN POTASSIUM 100 MG PO TABS: 100.0000 mg | ORAL_TABLET | Freq: Every day | ORAL | 3 refills | Status: DC

## 2021-08-08 NOTE — Telephone Encounter (Signed)
Khadar Suddeth [phone]  Camar called to say pharmacy told him he needed to call about refill on below medication.  losartan (COZAAR) 100 MG tablet  [id number] [place] [address]  [phone] Fax:  [phone]

## 2021-08-08 NOTE — Telephone Encounter (Signed)
done

## 2021-08-14 DIAGNOSIS — Z8249 Family history of ischemic heart disease and other diseases of the circulatory system: Secondary | ICD-10-CM | POA: Diagnosis not present

## 2021-08-14 DIAGNOSIS — M1990 Unspecified osteoarthritis, unspecified site: Secondary | ICD-10-CM | POA: Diagnosis not present

## 2021-08-14 DIAGNOSIS — N400 Enlarged prostate without lower urinary tract symptoms: Secondary | ICD-10-CM | POA: Diagnosis not present

## 2021-08-14 DIAGNOSIS — I10 Essential (primary) hypertension: Secondary | ICD-10-CM | POA: Diagnosis not present

## 2021-08-14 DIAGNOSIS — K5900 Constipation, unspecified: Secondary | ICD-10-CM | POA: Diagnosis not present

## 2021-08-14 DIAGNOSIS — Z823 Family history of stroke: Secondary | ICD-10-CM | POA: Diagnosis not present

## 2021-08-14 DIAGNOSIS — E569 Vitamin deficiency, unspecified: Secondary | ICD-10-CM | POA: Diagnosis not present

## 2021-08-14 DIAGNOSIS — Z791 Long term (current) use of non-steroidal anti-inflammatories (NSAID): Secondary | ICD-10-CM | POA: Diagnosis not present

## 2021-08-14 DIAGNOSIS — Z7982 Long term (current) use of aspirin: Secondary | ICD-10-CM | POA: Diagnosis not present

## 2021-08-21 ENCOUNTER — Encounter (HOSPITAL_BASED_OUTPATIENT_CLINIC_OR_DEPARTMENT_OTHER): Payer: Medicare PPO | Attending: Physician Assistant | Admitting: Physician Assistant

## 2021-08-21 DIAGNOSIS — Q819 Epidermolysis bullosa, unspecified: Secondary | ICD-10-CM | POA: Diagnosis not present

## 2021-08-21 DIAGNOSIS — L97322 Non-pressure chronic ulcer of left ankle with fat layer exposed: Secondary | ICD-10-CM | POA: Insufficient documentation

## 2021-08-21 DIAGNOSIS — I10 Essential (primary) hypertension: Secondary | ICD-10-CM | POA: Diagnosis not present

## 2021-08-21 DIAGNOSIS — L97812 Non-pressure chronic ulcer of other part of right lower leg with fat layer exposed: Secondary | ICD-10-CM | POA: Diagnosis not present

## 2021-08-21 DIAGNOSIS — L97822 Non-pressure chronic ulcer of other part of left lower leg with fat layer exposed: Secondary | ICD-10-CM | POA: Insufficient documentation

## 2021-08-21 DIAGNOSIS — I872 Venous insufficiency (chronic) (peripheral): Secondary | ICD-10-CM | POA: Insufficient documentation

## 2021-08-21 NOTE — Progress Notes (Signed)
Carlos White ([id number]) Visit Report for 08/21/2021 Arrival Information Details Patient Name: Date of Service: Carlos White 08/21/2021 12:45 PM Medical Record Number: [id number] Patient Account Number: [id number] Date of Birth/Sex: Treating RN: 02/01/1940 (81 y.o. [name], [name] Primary Care [name]: [name] Other Clinician: Referring [name]: Treating [name]/Extender: [name] in Treatment: 203 Visit Information History Since Last Visit Added or deleted any medications: No Patient Arrived: Ambulatory Any new allergies or adverse reactions: No Arrival Time: 12:56 Had a fall or experienced change in No Accompanied By: self activities of daily living that may affect Transfer Assistance: None risk of falls: Patient Requires Transmission-Based Precautions: No Signs or symptoms of abuse/neglect since last visito No Patient Has Alerts: Yes Hospitalized since last visit: No Patient Alerts: R ABI= 1.23, TBI = .86 Implantable device outside of the clinic excluding No L ABI= 1.28, TBI=.81 cellular tissue based products placed in the center since last visit: Has Dressing in Place as Prescribed: Yes Pain Present Now: No Electronic Signature(s) Signed: 08/21/2021 5:09:07 PM By: [name] RN, BSN Entered By: [name] on 08/21/2021 12:56:47 -------------------------------------------------------------------------------- Clinic Level of Care Assessment Details Patient Name: Date of Service: Carlos White 08/21/2021 12:45 PM Medical Record Number: [id number] Patient Account Number: [id number] Date of Birth/Sex: Treating RN: 06-28-1940 (81 y.o. [name] Primary Care [name]: [name] Other Clinician: Referring [name]: Treating [name]/Extender: [name] in Treatment: 203 Clinic Level of Care Assessment Items TOOL 4 Quantity Score X- 1 0 Use when only an EandM is performed on FOLLOW-UP  visit ASSESSMENTS - Nursing Assessment / Reassessment X- 1 10 Reassessment of Co-morbidities (includes updates in patient status) X- 1 5 Reassessment of Adherence to Treatment Plan ASSESSMENTS - Wound and Skin A ssessment / Reassessment _0  - 0 Simple Wound Assessment / Reassessment - one wound X- 2 5 Complex Wound Assessment / Reassessment - multiple wounds X- 1 10 Dermatologic / Skin Assessment (not related to wound area) ASSESSMENTS - Focused Assessment X- 2 5 Circumferential Edema Measurements - multi extremities X- 1 10 Nutritional Assessment / Counseling / Intervention _1  - 0 Lower Extremity Assessment (monofilament, tuning fork, pulses) _2  - 0 Peripheral Arterial Disease Assessment (using hand held doppler) ASSESSMENTS - Ostomy and/or Continence Assessment and Care _3  - 0 Incontinence Assessment and Management _4  - 0 Ostomy Care Assessment and Management (repouching, etc.) PROCESS - Coordination of Care _5  - 0 Simple Patient / Family Education for ongoing care X- 1 20 Complex (extensive) Patient / Family Education for ongoing care X- 1 10 Staff obtains Programmer, systems, Records, T Results / Process Orders est _6  - 0 Staff telephones HHA, Nursing Homes / Clarify orders / etc _7  - 0 Routine Transfer to another Facility (non-emergent condition) _8  - 0 Routine Hospital Admission (non-emergent condition) _9  - 0 New Admissions / Biomedical engineer / Ordering NPWT Apligraf, etc. , _10  - 0 Emergency Hospital Admission (emergent condition) _11  - 0 Simple Discharge Coordination X- 1 15 Complex (extensive) Discharge Coordination PROCESS - Special Needs _12  - 0 Pediatric / Minor Patient Management _13  - 0 Isolation Patient Management _14  - 0 Hearing / Language / Visual special needs _15  - 0 Assessment of Community assistance (transportation, D/C planning, etc.) _16  - 0 Additional assistance / Altered mentation _17  - 0 Support Surface(s) Assessment (bed, cushion, seat,  etc.) INTERVENTIONS - Wound Cleansing / Measurement _18  - 0 Simple Wound Cleansing - one wound X- 2 5  Complex Wound Cleansing - multiple wounds X- 1 5 Wound Imaging (photographs - any number of wounds) _0  - 0 Wound Tracing (instead of photographs) _1  - 0 Simple Wound Measurement - one wound X- 2 5 Complex Wound Measurement - multiple wounds INTERVENTIONS - Wound Dressings _2  - 0 Small Wound Dressing one or multiple wounds X- 2 15 Medium Wound Dressing one or multiple wounds _3  - 0 Large Wound Dressing one or multiple wounds <KGURKYHCWCBJSEGB>_1<\/DVVOHYWVPXTGGYIR>_4  - 0 Application of Medications - topical <WNIOEVOJJKKXFGHW>_2<\/XHBZJIRCVELFYBOF>_7  - 0 Application of Medications - injection INTERVENTIONS - Miscellaneous _6  - 0 External ear exam _7  - 0 Specimen Collection (cultures, biopsies, blood, body fluids, etc.) _8  - 0 Specimen(s) / Culture(s) sent or taken to Lab for analysis _9  - 0 Patient Transfer (multiple staff / Civil Service fast streamer / Similar devices) _10  - 0 Simple Staple / Suture removal (25 or less) _11  - 0 Complex Staple / Suture removal (26 or more) _12  - 0 Hypo / Hyperglycemic Management (close monitor of Blood Glucose) _13  - 0 Ankle / Brachial Index (ABI) - do not check if billed separately X- 1 5 Vital Signs Has the patient been seen at the hospital within the last three years: Yes Total Score: 160 Level Of Care: New/Established - Level 5 Electronic Signature(s) Signed: 08/21/2021 5:09:07 PM By: [name] RN, BSN Entered By: [name] on 08/21/2021 13:06:58 -------------------------------------------------------------------------------- Encounter Discharge Information Details Patient Name: Date of Service: [name], [name] 08/21/2021 12:45 PM Medical Record Number: [id number] Patient Account Number: [id number] Date of Birth/Sex: Treating RN: Jun 29, 1940 (81 y.o. [name] Primary Care Celesta Funderburk: [name] Other Clinician: Referring Brittany Amirault: Treating Latwan Luchsinger/Extender: [name] in Treatment:  203 Encounter Discharge Information Items Discharge Condition: Stable Ambulatory Status: Ambulatory Discharge Destination: Home Transportation: Private Auto Accompanied By: self Schedule Follow-up Appointment: Yes Clinical Summary of Care: Electronic Signature(s) Signed: 08/21/2021 5:09:07 PM By: [name] RN, BSN Entered By: [name] on 08/21/2021 13:07:23 -------------------------------------------------------------------------------- Lower Extremity Assessment Details Patient Name: Date of Service: Carlos White 08/21/2021 12:45 PM Medical Record Number: [id number] Patient Account Number: [id number] Date of Birth/Sex: Treating RN: 18-Feb-1940 (81 y.o. [name] Primary Care Aeryn Medici: [name] Other Clinician: Referring Carli Lefevers: Treating Rejeana Fadness/Extender: [name] in Treatment: 203 Edema Assessment Assessed: [Left: Yes] Patrice Paradise: Yes] Edema: [Left: Yes] [Right: Yes] Calf Left: Right: Point of Measurement: 35.5 cm From Medial Instep 37.5 cm 37 cm Ankle Left: Right: Point of Measurement: 11 cm From Medial Instep 25 cm 23 cm Vascular Assessment Pulses: Dorsalis Pedis Palpable: [Left:Yes] [Right:Yes] Electronic Signature(s) Signed: 08/21/2021 5:09:07 PM By: [name] RN, BSN Entered By: [name] on 08/21/2021 13:00:32 -------------------------------------------------------------------------------- Summit Details Patient Name: Date of Service: [name] 08/21/2021 12:45 PM Medical Record Number: [id number] Patient Account Number: [id number] Date of Birth/Sex: Treating RN: 1940-04-29 (81 y.o. [name] Primary Care Candie Gintz: [name] Other Clinician: Referring Daltin Crist: Treating Andrue Dini/Extender: [name] in Treatment: [place]ondo reviewed with physician Active Inactive Venous Leg Ulcer Nursing Diagnoses: Knowledge deficit  related to disease process and management Potential for venous Insuffiency (use before diagnosis confirmed) Goals: Patient will maintain optimal edema control Date Initiated: 09/30/2017 Target Resolution Date: 10/11/2021 Goal Status: Active Patient/caregiver will verbalize understanding of disease process and disease management Date Initiated: 09/30/2017 Date Inactivated: 11/04/2017 Target Resolution Date: 10/30/2017 Goal Status: Met Interventions: Assess peripheral edema status every visit.  Provide education on venous insufficiency Notes: Wound/Skin Impairment Nursing Diagnoses: Impaired tissue integrity Knowledge deficit related to ulceration/compromised skin integrity Goals: Patient/caregiver will verbalize understanding of skin care regimen Date Initiated: 09/30/2017 Target Resolution Date: 10/11/2021 Goal Status: Active Ulcer/skin breakdown will have a volume reduction of 30% by week 4 Date Initiated: 09/30/2017 Date Inactivated: 11/04/2017 Target Resolution Date: 10/30/2017 Goal Status: Met Interventions: Assess patient/caregiver ability to obtain necessary supplies Assess patient/caregiver ability to perform ulcer/skin care regimen upon admission and as needed Assess ulceration(s) every visit Provide education on ulcer and skin care Notes: Electronic Signature(s) Signed: 08/21/2021 5:09:07 PM By: [name] RN, BSN Entered By: [name] on 08/21/2021 13:05:15 -------------------------------------------------------------------------------- Pain Assessment Details Patient Name: Date of Service: Carlos White 08/21/2021 12:45 PM Medical Record Number: [id number] Patient Account Number: [id number] Date of Birth/Sex: Treating RN: [birth year] (81 y.o. [name] Primary Care Shareena Nusz: [name] Other Clinician: Referring Kitai Purdom: Treating Crescent Gotham/Extender: [name] in Treatment: 203 Active Problems Location of Pain Severity  and Description of Pain Patient Has Paino No Site Locations Rate the pain. Current Pain Level: 0 Pain Management and Medication Current Pain Management: Medication: No Cold Application: No Rest: No Massage: No Activity: No T.E.N.S.: No Heat Application: No Leg drop or elevation: No Is the Current Pain Management Adequate: Adequate How does your wound impact your activities of daily livingo Sleep: No Bathing: No Appetite: No Relationship With Others: No Bladder Continence: No Emotions: No Bowel Continence: No Work: No Toileting: No Drive: No Dressing: No Hobbies: No Electronic Signature(s) Signed: 08/21/2021 5:09:07 PM By: [name] RN, BSN Entered By: [name] on 08/21/2021 12:56:58 -------------------------------------------------------------------------------- Patient/Caregiver Education Details Patient Name: Date of Service: Carlos White 8/9/2023andnbsp12:45 PM Medical Record Number: [id number] Patient Account Number: [id number] Date of Birth/Gender: Treating RN: 04-21-1940 (81 y.o. [name] Primary Care Physician: [name] Other Clinician: Referring Physician: Treating Physician/Extender: [name] in Treatment: [phone] Education Assessment Education Provided To: Patient Education Topics Provided Wound/Skin Impairment: Handouts: Skin Care Do's and Dont's Methods: Explain/Verbal Responses: Reinforcements needed Electronic Signature(s) Signed: 08/21/2021 5:09:07 PM By: [name] RN, BSN Entered By: [name] on 08/21/2021 13:05:26 -------------------------------------------------------------------------------- Wound Assessment Details Patient Name: Date of Service: Carlos White 08/21/2021 12:45 PM Medical Record Number: [id number] Patient Account Number: [id number] Date of Birth/Sex: Treating RN: 02-22-1940 (81 y.o. [name] Primary Care Tomorrow Dehaas: [name] Other Clinician: Referring  Illene Sweeting: Treating Rankin Coolman/Extender: [name] in Treatment: 203 Wound Status Wound Number: 2 Primary Venous Leg Ulcer Etiology: Wound Location: Left, Distal, Lateral Lower Leg Wound Status: Open Wounding Event: Gradually Appeared Comorbid Cataracts, Chronic Obstructive Pulmonary Disease (COPD), Date Acquired: 03/13/2017 History: Hypertension Weeks Of Treatment: 203 Clustered Wound: Yes Photos Wound Measurements Length: (cm) 9 Width: (cm) 7.2 Depth: (cm) 0.2 Clustered Quantity: 1 Area: (cm) 50 Volume: (cm) 10 % Reduction in Area: 34.3% % Reduction in Volume: 73.7% Epithelialization: Medium (34-66%) Tunneling: No .894 Undermining: No .179 Wound Description Classification: Full Thickness Without Exposed Support Stru Wound Margin: Distinct, outline attached Exudate Amount: Medium Exudate Type: Serosanguineous Exudate Color: red, brown ctures Foul Odor After Cleansing: No Slough/Fibrino Yes Wound Bed Granulation Amount: Large (67-100%) Exposed Structure Granulation Quality: Red Fascia Exposed: No Necrotic Amount: Small (1-33%) Fat Layer (Subcutaneous Tissue) Exposed: Yes Necrotic Quality: Adherent Slough Tendon Exposed: No Muscle Exposed: No Joint Exposed: No Bone Exposed: No Treatment Notes Wound #2 (  Lower Leg) Wound Laterality: Left, Lateral, Distal Cleanser Soap and Water Discharge Instruction: May shower and wash wound with dial antibacterial soap and water prior to dressing change. Peri-Wound Care Zinc Oxide Ointment 30g tube Discharge Instruction: Apply Zinc Oxide to periwound with each dressing change as needed for maceration Sween Lotion (Moisturizing lotion) Discharge Instruction: Apply moisturizing lotion as directed Topical neosporin Discharge Instruction: apply three times a week. Primary Dressing Secondary Dressing ABD Pad, 5x9 Discharge Instruction: Apply over primary dressing as directed. Secured With Elastic  Bandage 4 inch (ACE bandage) Discharge Instruction: Secure with ACE bandage as directed. Kerlix Roll Sterile, 4.5x3.1 (in/yd) Discharge Instruction: Secure with Kerlix as directed. Compression Wrap Compression Stockings Add-Ons Electronic Signature(s) Signed: 08/21/2021 5:09:07 PM By: [name] RN, BSN Entered By: [name] on 08/21/2021 13:04:16 -------------------------------------------------------------------------------- Wound Assessment Details Patient Name: Date of Service: Carlos White 08/21/2021 12:45 PM Medical Record Number: [id number] Patient Account Number: [id number] Date of Birth/Sex: Treating RN: May 08, 1940 (81 y.o. [name] Primary Care Allenmichael Mcpartlin: [name] Other Clinician: Referring Yisrael Obryan: Treating Garner Dullea/Extender: [name] in Treatment: 203 Wound Status Wound Number: 5 Primary Vasculopathy Etiology: Wound Location: Right, Lateral, Anterior Lower Leg Wound Status: Open Wounding Event: Gradually Appeared Comorbid Cataracts, Chronic Obstructive Pulmonary Disease (COPD), Date Acquired: 10/26/2017 History: Hypertension Weeks Of Treatment: 199 Clustered Wound: Yes Photos Wound Measurements Length: (cm) 9.8 Width: (cm) 8 Depth: (cm) 0.1 Clustered Quantity: 3 Area: (cm) 61.575 Volume: (cm) 6.158 % Reduction in Area: -13.4% % Reduction in Volume: -13.4% Epithelialization: Large (67-100%) Tunneling: No Undermining: No Wound Description Classification: Full Thickness Without Exposed Support Stru Wound Margin: Distinct, outline attached Exudate Amount: Medium Exudate Type: Serosanguineous Exudate Color: red, brown ctures Foul Odor After Cleansing: No Slough/Fibrino Yes Wound Bed Granulation Amount: Large (67-100%) Exposed Structure Granulation Quality: Red Fascia Exposed: No Necrotic Amount: None Present (0%) Fat Layer (Subcutaneous Tissue) Exposed: Yes Tendon Exposed: No Muscle Exposed:  No Joint Exposed: No Bone Exposed: No Treatment Notes Wound #5 (Lower Leg) Wound Laterality: Right, Lateral, Anterior Cleanser Soap and Water Discharge Instruction: May shower and wash wound with dial antibacterial soap and water prior to dressing change. Peri-Wound Care Zinc Oxide Ointment 30g tube Discharge Instruction: Apply Zinc Oxide to periwound with each dressing change as needed for maceration Sween Lotion (Moisturizing lotion) Discharge Instruction: Apply moisturizing lotion as directed Topical neosporin Discharge Instruction: apply three times a week. Primary Dressing Secondary Dressing ABD Pad, 5x9 Discharge Instruction: Apply over primary dressing as directed. Secured With Elastic Bandage 4 inch (ACE bandage) Discharge Instruction: Secure with ACE bandage as directed. Kerlix Roll Sterile, 4.5x3.1 (in/yd) Discharge Instruction: Secure with Kerlix as directed. Compression Wrap Compression Stockings Add-Ons Electronic Signature(s) Signed: 08/21/2021 5:09:07 PM By: [name] RN, BSN Entered By: [name] on 08/21/2021 13:03:53 -------------------------------------------------------------------------------- Vitals Details Patient Name: Date of Service: [name], GO RDO [name] 08/21/2021 12:45 PM Medical Record Number: [id number] Patient Account Number: [id number] Date of Birth/Sex: Treating RN: Jun 03, 1940 (81 y.o. [name] Primary Care Girlie Veltri: [name] Other Clinician: Referring Ahyana Skillin: Treating Val Schiavo/Extender: [name] in Treatment: 203 Vital Signs Time Taken: 13:00 Temperature (F): 98 Height (in): 71 Pulse (bpm): 72 Weight (lbs): 220 Respiratory Rate (breaths/min): 16 Body Mass Index (BMI): 30.7 Blood Pressure (mmHg): 173/76 Reference Range: 80 - 120 mg / dl Electronic Signature(s) Signed: 08/21/2021 5:09:07 PM By: [name] RN, BSN Entered By: [name] on 08/21/2021 13:00:50

## 2021-08-22 NOTE — Progress Notes (Signed)
CLAYBORNE, DIVIS ([id number]) Visit Report for 08/21/2021 Chief Complaint Document Details Patient Name: Date of Service: Carlos White 08/21/2021 12:45 PM Medical Record Number: [id number] Patient Account Number: [id number] Date of Birth/Sex: Treating RN: [birth year] (81 y.o. M) Primary Care Provider: [name] Other Clinician: Referring Provider: Treating Provider/Extender: [name] in Treatment: [phone] Information Obtained from: Patient Chief Complaint Bilateral LE Ulcers Electronic Signature(s) Signed: 08/21/2021 1:16:19 PM By: Worthy Keeler PA-C Entered By: Worthy Keeler on 08/21/2021 13:16:19 -------------------------------------------------------------------------------- HPI Details Patient Name: Date of Service: Carlos White 08/21/2021 12:45 PM Medical Record Number: [id number] Patient Account Number: [id number] Date of Birth/Sex: Treating RN: 1941/01/12 (81 y.o. M) Primary Care Provider: [name] Other Clinician: Referring Provider: Treating Provider/Extender: [name] in Treatment: [phone] History of Present Illness HPI Description: 09/30/17 on evaluation today patient presents for initial evaluation and our clinic concerning issues that he has been having with his bilateral lower extremities. He states this has been going on for quite some time at least six months. Currently his regiment has been mainly cleaning the area with peroxide, applying the is foreign ointment, and wrapping the area with ABD pads and then an ace wrap loosely. He has dealt with issues of this nature he tells me for quite some time. He does have a history of having had a compound fracture of the left lower extremity which he thinks also makes this a much more difficult area for him to heal. He's previously been told that he had poor vascular flow but this was years ago at [facility] we do not have any of those records at this time. He has a history  of Epidermolysis Bullosa which was diagnosed around age 41 and he has been cared for at [facility] since that time. Subsequently he states this is hereditary and two of his children one male and one male also have this as well is one of his grandchildren that he is aware of. He has no evidence of infection necessarily at this White although he does have some necrotic tissue noted on the surface of the wound as far as the largest, left lateral lower extremity ulcer, is concerned. Overall I feel like all things considered he's been taking care of this very well. Obviously he has some fairly significant issues going on at this White in this regard. He does have a history otherwise of hypertension though for the most part other than the compound fracture of the left leg he seems to have been fairly healthy in my pinion. 10/07/17 on evaluation today patient actually appears to be doing better in regard to his bilateral lower extremity ulcers. With that being said he does still have some evidence of slough noted on the surface of the wounds I think the Iodoflex has been beneficial for him. His arterial studies are scheduled for October 2. With that being said I do believe that he is continuing to show signs of good improvement which is at least good news. 10/14/17 on evaluation today patient appears to be doing very well in regard to his lower extremity ulcers. He's definitely made some progress as far as healing is concerned although there still are several open areas that are going to need to be addressed. He did have his arterial study today which fortunately shows good findings with a right ABI of 1.23 with a TBI of 0.86 in the left ABI of 1.28 with a  TBI of 0.81. This is good news and will allow [place] to perform debridement as well. 10/23/2017; patient with a large wound on the left lateral calf, sizable area on the left medial malleolus and an area on the right lateral malleolus. He has a new blister consistent  with his underlying blistering skin disease just above this area we have been using Iodoflex on the lateral left calf lateral right ankle and collagen on the medial left ankle. We have been using Kerlix Coban wraps 10/28/17 on evaluation today the patient continues to have signs of improvement in regard to the overall appearance of the original wound. Unfortunately he did have some blistering over the right lateral lower extremity which has appeared to rupture on evaluation today and likely some of the dead tissue on the surface needs to be cleaned away the good news is this does not appear to be to significantly deep at this time. 11/04/17 on evaluation today patient actually appears to be doing a little better in regard to his lower extremity ulcers. He has been tolerating the dressing changes without complication. With that being said he does still have a significant wound especially over the left lateral lower extremity unfortunately. All of the wounds pretty much are going to require sharp debridement today. 11/11/17 on evaluation today patient appears to be doing more poorly in regard to his left lower extremity in particular. There does not appear to be any evidence of systemic infection although the wound itself as far as the larger left lateral lower extremity ulcer actually appears to be infected in my pinion. There's an older and the surface of the wound is dramatically worsened compared to last week. No fevers, chills, nausea, or vomiting noted at this time. 11/18/17 upon evaluation today patient actually appears to be doing better. I did review his culture today which really did not show any specific organism is a positive reason for his wound decline. There are multiple organisms present not predominant. Nonetheless he seems to be tolerate the doxycycline well in his wounds in general do seem to be doing better. Fortunately there does not appear to be any evidence of infection at this time  which is good news. Overall I'm very pleased with how things appear. Nonetheless he still has a lot of healing to go. I do think he could benefit from a Juxta-Lite wrap. 11/25/17 on evaluation today patient actually appears to be doing fairly well in regard to his wounds. He is still taking the antibiotics he has a few days left. Fortunately this seems to have been excellent for him as far as getting the infection control and very happy in this regard. With that being said the patient likewise is also very pleased with how things appear at this time in comparison to where we were he's not having as much pain. 12/02/17 Seen today for follow p and management of LLE wounds. Wounds appear to show some improvement. He denies pain, fever, or chills. Completed a course of doxycycline earlier this month. Scheduled to received Juxta-Lite wrap this week. No s/s of infections. 12/09/17 on evaluation today patient actually appears to be doing a little bit better in regard to his wounds. This is obscene very slow process and unfortunately he has a couple of new areas and this is due to the Epidermolysis Bullosa. Nonetheless I am concerned about the fact that he seems to be getting more areas not less that is the reason we're gonna work on getting schedule for the  vascular referral to see the venous specialist. 12/23/17 upon evaluation today patient's wounds currently shows evidence of still not doing quite as well is what I would like to have seen. Subsequently the patient did have his venous study which showed evidence of venous stasis. Subsequently I do think that a vascular evaluation for consideration of venous intervention would be appropriate. I'm not necessarily suggesting that will be anything that can be done but I think it is at least a good idea. He is in agreement with this plan. 12/30/17 on evaluation today patient actually appears to be doing very well in regard to his wounds when compared to previous  evaluation. Subsequently we have been using the [facility] Dressing which actually appears to have done excellent on his left lateral lower extremity ulcer. The quality of the wound surface is dramatically improved. There is some slight debridement that is going to be required at a couple of locations but overall I'm extremely pleased with how things appear here. 01/07/2018; this is a patient with a primary skin disorder epidermolyis bullosa. Is a large wound on the left lateral calf and smaller wounds on the right however there is a new wound on the right mid tibia area that occurred within the compression wrap that he did not change. We have been using Hydrofera Blue. On the left he is using Hydrofera Blue and Santyl to the inferior part of the wound and changing the dressing himself. 01/15/2018; primary skin disorder epidermolysis bullosa. He has several difficult wounds including the left lateral calf, smaller wounds on the left medial calf and the right lateral calf. The major area on the left lateral calf has a smaller area inferiorly that has necrotic debris we have been using Santyl to this. The rest of the wounds we have been using Hydrofera Blue. The area on the left calf actually looks larger this week. Uncontrolled edema several small open areas above it that are superficial 01/20/18 on evaluation today patient appears to be doing better as compared to last week in regard to his wounds of the bilateral lower extremities. He tolerated the bilateral compression wrap without complication. Overall I'm very pleased with how things appear at this time. The patient likewise is very happy. 01/27/18 on evaluation today patient appears to be doing decently well in regard to his bilateral lower extremity ulcers. He's been tolerating the dressing changes without complication. One issue he had is that he did have more drainage to the left leg wrapped last week. He states in fact he probably should come  in and let us change it on Friday however he just left it in place and kept adding extra absorption with ABD pads to the external portion of the wrap. Unfortunately he does have some aspiration type breakdown nothing significant but I do believe that this was probably counterproductive in general. Nonetheless his wounds do not appear to be terrible overall. 02/03/18 on evaluation today patient appears to be doing rather well in regard to his lower extremity ulcers. He has been tolerating the dressing changes without complication. He does tell me that he had to change the wrap on the left one since we last saw him. Subsequently I do not see any evidence of infection I do feel like the food was much better controlled at this White. 02/10/18 on evaluation today patient appears to be doing rather well in regard to his ulcers. He still has significant alterations especially on the left lateral lower extremity. Fortunately there's no signs  of infection at this time. Overall I feel like he is making good progress are some areas that I'm gonna attempt some debridement today. 02/17/18 on evaluation today patient appears to be doing okay in regard to his lower Trinity ulcer. It does appear on both locations he has a little bit of drainage causing some breakdown in maceration around the wound bed's although it doesn't appear to be too bad the right is a little bit worse than left. Fortunately there's no signs of infection which is good news. No fevers, chills, nausea, or vomiting noted at this time. 03/10/18 on evaluation today patient actually appears to be doing rather poorly in regard to his bilateral lower extremity ulcers. The right in particular is draining profusely and the wound is actually enlarging which is not good. I'm concerned about both possibly infection and the fact that there's a lot of moisture which is causing breakdown as well. Unfortunately the patient has been trying to change this at home I'm  afraid he may need to change more frequently in order to see the improvement that were looking for. There's no signs of systemic infection. 03/17/18 patient actually appears to be doing significantly better at this White in regard to his bilateral lower extremity ulcers. Fortunately there's no signs of infection. That is worsening infection at least indefinitely nothing systemic. With that being said he is having a lot of drainage though not quite as much is there in his last evaluation. Overall feel like he's on the side of improvement. I think if his results back from his culture which showed that he had a positive group B strep along with abundant Pseudomonas noted on the culture. For that reason I am gonna have him continue with the linezolid as we previously have ordered for him and I did go ahead as well today and prescribe Levaquin as well in order to treat the Pseudomonas portion of the infection noted. 03/24/18 on evaluation today patient actually appears to be doing very well in regard to his lower Trinity ulcer. He's been tolerating the dressing changes without complication. Fortunately both legs show signs of less drainage in his edema is very well controlled at this White as well. Overall very pleased with how things seem to be progressing. 03/31/18 on evaluation today patient actually appears to be doing excellent in regard to his bilateral lower extremity ulcers. These are not draining nearly as significant as what they were in the past overall seem to be shown signs of excellent improvement which is great news. Fortunately there is no sign of active infection at this time I do believe that the Levaquin has done extremely well for him in this regard. The patient continues to change these at home typically every day. We may be able to slowly work towards every other day changes since the drainage seems to be slowing down quite significantly. 04/14/18 on evaluation today patient appears to be  doing well in regard to his bilateral lower extremities. Let me Hilda Blades Almost completely healed which is excellent news. Fortunately he's shown signs of improvement all other sites as well with new skin growth there's some slight hyper granular tissue but for the most part this seems to be well maintained with the [facility] Dressing. I'm very happy in this regard. 04/28/18 on evaluation today patient appears to be doing rather well in regard to his ulcers of the bilateral lower extremities all things considering. He continues to make some progress as far as new skin growth.  There still some hyper granulation noted at this White despite the use of the [facility] Dressing. This is not terrible but I think we may want to consider conclude cauterization today with silver nitrate to try to help knock some of his back as well as helping with any biofilm on the surface of the wound. 05/12/18 on evaluation today patient's wounds actually appear to be doing fairly well in regard to the bilateral lower extremities. He's been tolerating the dressing changes without complication. Fortunately there's no signs of active infection at this time which is good news. Overall very pleased with how things seem to be progressing. You select silver nitrate was beneficial for him. 05/26/18 on evaluation today patient appears to be doing better in regard to left lower extremity and a little bit worse in regard to the right lower extremity. He states that he was pulling off the [facility] Dressing peel back some of the skin making this area significantly larger than what it was previous. He's not had any issues other than this and states even that hasn't caused any pain he just seems to obviously have a much larger area on the right when compared to the previous time I saw him. No fevers, chills, nausea, or vomiting noted at this time. 06/16/18 on evaluation today patient actually appears to be doing a little better  in my pinion in regard to his lower summary ulcers. He has new skin islands that seem to be spreading which is good news. Fortunately there's no signs of active infection at this time. His biggest issue is he tells me that coming as often as he does is becoming very cost prohibitive. He wonders if we can potentially spread this out. 07/14/18 on evaluation today patient appears to be doing a little bit worse in regard to his lower from the ulcer. Unfortunately he still continues to have a significant amount of drainage I think we need to do something to try to help this more. He is still somewhat reluctant to go the route of the Wound VAC although that may be the most appropriate thing for him. No fevers, chills, nausea, or vomiting noted at this time. 08/18/2018 on evaluation today patient actually appears to be doing quite well with regard to his bilateral lower extremity ulcers. I do feel like that currently he is making great progress the care max does seem to be doing a great job at helping to control the moisture he has no maceration or skin breakdown. Again this seems to be an excellent way to go. 1 thing we may want to change is adding collagen to the base of the wound and then the care max over top he is not opposed to this. 09/15/2018 on evaluation today patient appears to be doing well with regard to his bilateral lower extremity ulcers. He is showing some signs of improvement not necessarily in size but definitely in appearance. In fact he has a lot of new skin growing throughout the wounds along the edges as well as in the central portion of the wounds on both lower extremities. Overall I am extremely pleased to see this. 10/20/2018 on evaluation today patient actually appears to be doing quite well with regard to his wounds. They are not measuring significantly smaller but he does have a lot of new epithelization noted as compared to previous. Fortunately there is no signs of active infection at  this time. No fevers, chills, nausea, vomiting, or diarrhea. 11/17/2018 on evaluation today patient  presents for reevaluation concerning his bilateral lower extremity ulcers. Fortunately there is no signs of active infection at this time today. He has been tolerating the dressing changes without complication. No fevers, chills, nausea, vomiting, or diarrhea. Unfortunately in general the patient has not made as much improvement as I would like to have seen up to this White. He has been tolerating the dressing changes without complication and he does an excellent job taking care of his wounds at home in my opinion. The biggest issue I see is that he is just not making the progress that we need to be seeing currently. I think we may want to consider having him seen at a plastic surgery appointment and he has previously seen someone in years past at [facility] in [place]. That is definitely a possibility for [place] to look into at this White. 12/29/2018 on evaluation today patient appears to be doing better in regard to the overall visual appearance of his wounds which do not appear to be as macerated. He does have a much larger skin island in the middle of the left lower extremity ulcer which is doing much better. He tells me the pain is also significantly better. With that being said overall his improvement as far as the size of the wounds is not better but again these are very irregular in change shape quite often. Fortunately there is no evidence of active infection at this time which is great news. He never heard anything from [facility] regarding the plastic surgery referral that we made to them. 01/26/2019 upon evaluation today patient appears to be doing a little better in regard to his wounds today. He has been tolerating the dressing changes again he performs these for the most part on his own. He does a great job wrapping his legs in my opinion. Unfortunately he has not been able to  get down to [facility] to see if there is anything from a plastic surgery standpoint that could be done to help with his legs simply due to the fact that his wife unfortunately sustained a compression fracture in her spine she is seeing Dr. Saintclair Halsted and subsequently is going to be having what sounds to be a kyphoplasty type procedure. With that being said that has not been scheduled yet there is still waiting on an MRI the patient is very busy in fact overly busy trying to help take care of his wife at this White. I completely understand this is more of a strain on him at this time 02/23/2019 upon evaluation today patient actually appears to be making some progress. I am actually very pleased with the overall appearance of his wounds even compared to last evaluation. He seems to be doing quite well. He is taking care of his wife unfortunately she did have a compression fracture she has had the procedure for this but still she has a slow road to recovery. For that reason he still not gone to [facility] for a second opinion in this regard. Obviously the goal there was if there was anything that can be done from a skin graft standpoint or otherwise. 03/23/2019 upon evaluation today patient continues to have issues with lower extremity ulcers. Since the beginning he has made progress but at the same time the wounds unfortunately just will not close. We have been trying to get the patient to [facility] to see a specialist there but unfortunately with the everything going on with his wife he has not  been able to make that appointment time yet he states he may be able to in the next 1-2 months but is not really sure. 04/27/2019 on evaluation today patient appears to be doing a little bit more poorly. His last evaluation. He appears to have some erythema around the edges of the wound at this White. Fortunately there is no signs of active infection at this time which is good news. No fevers, chills,  nausea, vomiting, or diarrhea. 06/08/2019 on evaluation today patient appears to be doing well with regard to his wounds. Overall they are actually measuring smaller compared to the last visit last month. We did treat him for Pseudomonas as well as methicillin-resistant Staph aureus. He was only on the treatment for MRSA however for 7 days as the Cipro was resistant and subsequently we had to place him on doxycycline. Nonetheless I am thinking that we may want to add the doxycycline and just do a month-long treatment considering the longstanding nature of his wounds and see if we get this under better control. 07/13/2019 upon evaluation today patient appears to be doing fairly well in regard to his bilateral lower extremities. There does not appear to be any signs of active infection which is good news. No fevers, chills, nausea, vomiting, or diarrhea. 08/10/2019 upon evaluation today patient appears to be doing about the same with regard to his wounds in general. Unfortunately he is not significantly better although is also not significantly worse which is great news there is no evidence of active infection at this time which is good news. He still dealing with a lot going on with his wife and therefore is not really able to go see anyone at the specialty clinic at [facility] that we have previously set up still. 09/07/2019 on evaluation today patient appears to be doing well with regard to his wounds. In fact this is probably the best that have seen so far in quite a few months. Overall I am very pleased with where things stand at this time. No fevers, chills, nausea, vomiting, or diarrhea. 10/05/2019 upon evaluation today patient appears to be doing more poorly in regard to his legs at this White. He actually is showing some signs of infection. This has been something that we seem to be back-and-forth with as far as trying to keep these wounds from becoming infected. He takes care of them very well in my  opinion but nonetheless I am concerned in this regard. He tells me that his wife is still doing really about the same she is slowly getting better. Nonetheless he still spends the majority of his time helping to take care of her. 11/02/2019 upon evaluation today patient actually appears to be doing somewhat better in regard to his legs. I do believe that the compounded antibiotic treatment from [facility] has been beneficial for him. Overall I am extremely pleased with where things stand today. There is no signs of active infection at this time. The patient states he has much less drainage than he has in the past. 12/07/2019 upon evaluation today patient appears to be doing well at this time in regard to the overall appearance of his wound bed. Currently there is no signs of active infection at this time. 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 Especially when he stressed. Fortunately there  is no signs of active infection locally or systemically at this White. 01/18/2020 on evaluation today patient appears to be doing well with regard to his leg ulcers. He has been tolerating the dressing changes without complication. Fortunately there is no signs of infection and overall I feel like his legs are doing about the best they have done in quite some time. There does not appear to be any evidence of active infection which is great news and overall very pleased. 02/29/2020 upon evaluation today patient's wounds actually appear to be doing quite well currently. There is no sign of active infection at this time. No fevers, chills, nausea, vomiting, or diarrhea. 04/11/2025 on evaluation today patient appears to be doing excellent in regard to his wounds on the legs to be honest. He has made a lot of progress there does not appear to be any signs of  active infection and overall I think that he is better than previous although still the wounds are quite significant obviously. In general I think that he is pleased with where things stand at this White. Still there is quite a bit of work to do here. 07/13/2020 upon evaluation today patient appears to be doing well with regard to his wound. Is been tolerating the dressing changes without complication on both legs. He just has the 2 main wounds on each leg at the ankle on the medial region of the right leg is completely healed. His wounds do seem to have gotten better during the time that he has been on bedrest as result of the pelvis fracture. Obviously does not the way that we wanted to see things improved but nonetheless he tells me that it is what it is. Fortunately he does seem to be doing better he tells me that her daughter from the [place] is actually down helping to take care of them out as he and his wife while he is recovering. 08/08/2020 upon evaluation today patient appears to be doing well currently in regard to his leg ulcers. He has been tolerating the dressing changes without complication. Fortunately there is no signs of active infection at this time. No fevers, chills, nausea, vomiting, or diarrhea. 09/26/2020 upon evaluation today patient appears to be doing well with regard to his wound. He has been showing signs of good improvement which is great news and overall I am extremely pleased with where things stand today. I do think that he does well with his dressing changes and overall has really improved significantly here. segment how that how she doing 11/14/2020 upon evaluation today patient appears to be doing well with regard to his leg ulcers. He has been tolerating the dressing changes without complication. Fortunately there does not appear to be any signs of active infection at this time. No fevers, chills, nausea, vomiting, or diarrhea. 12/26/2020 upon evaluation today patient  actually appears to be making some good progress here in regard to his wounds. Fortunately I do not see any signs of active infection which is great news. No fevers, chills, nausea, vomiting, or diarrhea. 02/06/2021 upon evaluation today patient appears to be doing well with regard to his wound currently. He has been tolerating the dressing changes without complication. Fortunately there does not appear to be any evidence of active infection locally nor systemically at this time. No fevers, chills, nausea, vomiting, or diarrhea. Overall I think that this is very slow but does seem to be getting a little bit better and a little smaller he has been through a  lot of stress recently with his wife she is having a heart evaluation tomorrow to see whether or not an aortic valve replacement is something that she could tolerate and undergo. 04/03/2020 upon evaluation today patient's legs are actually showing signs of significant improvement. This is actually a dramatic overall improvement since I last saw him I think we are getting very close to closing these wounds. That is an amazing thing is we been dealing with this for quite some time. The patient is very pleased to hear things appear to be doing well as well. 05-15-2021 upon evaluation today patient appears to be doing well with regard to his wound on the bilateral lower extremities. Both are showing signs of excellent improvement which is great news. There does not appear to be any signs of active infection locally nor systemically at this time. No fevers, chills, nausea, vomiting, or diarrhea. 06-26-2021 upon evaluation today patient appears to be doing well with regard to his wounds on the legs. They are actually showing signs of improvement in my opinion which is great news. Fortunately I do not see any evidence of active infection locally or systemically which is great news. No fevers, chills, nausea, vomiting, or diarrhea. 08-21-2021 upon evaluation  today patient appears to be making excellent progress. Has been using a mixture of topical antibiotic ointment followed by the ABD pads which does seem to be doing quite well. I do not see any evidence of active infection locally or systemically at this time which is great news. With that being said I think that he is very close to complete resolution. Electronic Signature(s) Signed: 08/21/2021 2:20:10 PM By: Worthy Keeler PA-C Entered By: Worthy Keeler on 08/21/2021 14:20:10 -------------------------------------------------------------------------------- Physical Exam Details Patient Name: Date of Service: Carlos White 08/21/2021 12:45 PM Medical Record Number: [id number] Patient Account Number: [id number] Date of Birth/Sex: Treating RN: [birth year] (81 y.o. M) Primary Care Provider: [name] Other Clinician: Referring Provider: Treating Provider/Extender: [name] in Treatment: 203 Constitutional Well-nourished and well-hydrated in no acute distress. Respiratory normal breathing without difficulty. Psychiatric this patient is able to make decisions and demonstrates good insight into disease process. Alert and Oriented x 3. pleasant and cooperative. Notes Upon inspection patient's wound bed actually showed signs of good granulation epithelization at this White. Fortunately I do not see any signs of infection locally or systemically which is great news and overall I am extremely pleased with where things stand today. Electronic Signature(s) Signed: 08/21/2021 2:20:24 PM By: Worthy Keeler PA-C Entered By: Worthy Keeler on 08/21/2021 14:20:24 -------------------------------------------------------------------------------- Physician Orders Details Patient Name: Date of Service: Carlos White 08/21/2021 12:45 PM Medical Record Number: [id number] Patient Account Number: [id number] Date of Birth/Sex: Treating RN: 1940-10-03 (81 y.o. 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 Bathing/ Shower/ Hygiene May shower and wash wound with soap and water. - on days that dressing is changed Edema Control - Lymphedema / SCD / Other Bilateral Lower Extremities Elevate legs to the level of the heart or above for 30 minutes daily and/or when sitting, a frequency of: - throughout the day Avoid standing for long periods of time. Exercise regularly Moisturize legs daily. Compression stocking or Garment 20-30 mm/Hg pressure to: - Juxtalite to both legs daily Wound Treatment Wound #2 - Lower Leg Wound Laterality: Left, Lateral, Distal Cleanser: Soap and Water 3 x Per Week/30 Days Discharge Instructions: May shower and wash wound with dial antibacterial soap and water prior to dressing change. Peri-Wound Care: Zinc Oxide Ointment 30g tube 3 x Per Week/30 Days Discharge Instructions: Apply Zinc Oxide to periwound with each dressing change as needed for maceration Peri-Wound Care: Sween Lotion (Moisturizing lotion) 3 x Per Week/30 Days Discharge Instructions: Apply moisturizing lotion as directed Topical: neosporin 3 x Per Week/30 Days Discharge Instructions: apply three  times a week. Secondary Dressing: ABD Pad, 5x9 (Generic) 3 x Per Week/30 Days Discharge Instructions: Apply over primary dressing as directed. Secured With: Elastic Bandage 4 inch (ACE bandage) 3 x Per Week/30 Days Discharge Instructions: Secure with ACE bandage as directed. Secured With: [employer], 4.5x3.1 (in/yd) (Generic) 3 x Per Week/30 Days Discharge Instructions: Secure with Kerlix as directed. Wound #5 - Lower Leg Wound Laterality: Right, Lateral, Anterior Cleanser: Soap and Water 3 x Per Week/30 Days Discharge Instructions: May shower and wash wound with dial antibacterial soap and water prior to dressing change. Peri-Wound Care: Zinc Oxide Ointment 30g tube 3 x Per Week/30 Days Discharge Instructions: Apply Zinc Oxide to periwound with each dressing change as needed for maceration Peri-Wound Care: Sween Lotion (Moisturizing lotion) 3 x Per Week/30 Days Discharge Instructions: Apply moisturizing lotion as directed Topical: neosporin 3 x Per Week/30 Days Discharge Instructions: apply three times a week. Secondary Dressing: ABD Pad, 5x9 (Generic) 3 x Per Week/30 Days Discharge Instructions: Apply over primary dressing as directed. Secured With: Elastic Bandage 4 inch (ACE bandage) 3 x Per Week/30 Days Discharge Instructions: Secure with ACE bandage as directed. Secured With: [employer], 4.5x3.1 (in/yd) (Generic) 3 x Per Week/30 Days Discharge Instructions: Secure with Kerlix as directed. Electronic Signature(s) Signed: 08/21/2021 5:09:07 PM By: [name] RN, BSN Signed: 08/22/2021 3:04:24 PM By: Worthy Keeler PA-C Entered By: [name] on 08/21/2021 13:37:19 -------------------------------------------------------------------------------- Problem List Details Patient Name: Date of Service: Carlos White 08/21/2021 12:45 PM Medical Record Number: [id number] Patient Account Number: [id number] Date of Birth/Sex: Treating RN: 21-Mar-1940 (81 y.o. 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 09/30/2017 No Yes L97.322 Non-pressure chronic ulcer of left ankle with fat layer exposed 09/30/2017 No Yes L97.822 Non-pressure chronic ulcer of other part of left lower leg with fat layer exposed9/18/2019 No Yes L97.812 Non-pressure chronic ulcer of other part of right lower leg with fat layer 09/30/2017 No Yes exposed I10 Essential (primary) hypertension 09/30/2017 No Yes Inactive Problems Resolved Problems Electronic Signature(s) Signed: 08/21/2021 1:16:12 PM By: Worthy Keeler PA-C Entered By: Worthy Keeler on 08/21/2021 13:16:12 -------------------------------------------------------------------------------- Progress Note Details Patient Name: Date of Service: Carlos White, Carlos White. 08/21/2021 12:45  PM Medical Record Number: [id number] Patient Account Number: [id number] Date of Birth/Sex: Treating RN: November 08, 1940 (81 y.o. M) Primary Care Provider: [name] Other Clinician: Referring Provider: Treating Provider/Extender: [name] in Treatment: 203 Subjective Chief Complaint Information obtained from Patient Bilateral LE Ulcers History of Present Illness (HPI) 09/30/17 on evaluation today patient presents for initial evaluation and our clinic concerning issues that he has been having with his bilateral lower extremities. He states this has been going on for quite some time at least six months. Currently his regiment has been mainly cleaning the area with peroxide, applying the is foreign ointment, and wrapping the area with ABD pads and then an ace wrap loosely. He has dealt with issues of this nature he tells me  for quite some time. He does have a history of having had a compound fracture of the left lower extremity which he thinks also makes this a much more difficult area for him to heal. He's previously been told that he had poor vascular flow but this was years ago at [facility] we do not have any of those records at this time. He has a history of Epidermolysis Bullosa which was diagnosed around age 16 and he has been cared for at [facility] since that time. Subsequently he states this is hereditary and two of his children one male and one male also have this as well is one of his grandchildren that he is aware of. He has no evidence of infection necessarily at this White although he does have some necrotic tissue noted on the surface of the wound as far as the largest, left lateral lower extremity ulcer, is concerned. Overall I feel like all things considered he's been taking care of this very well. Obviously he has some fairly significant issues going on at this White in this regard. He does have a history otherwise of hypertension though for the most part other than the compound fracture of the left leg he seems to have been fairly healthy in my pinion. 10/07/17 on evaluation today patient actually appears to be doing better in regard to his bilateral lower extremity ulcers. With that being said he does still have some evidence of slough noted on the surface of the wounds I think the Iodoflex has been beneficial for him. His arterial studies are scheduled for October 2. With that being said I do believe that he is continuing to show signs of good improvement which is at least good news. 10/14/17 on evaluation today patient appears to be doing very well in regard to his lower extremity ulcers. He's definitely made some progress as far as healing is concerned although there still are several open areas that are going to need to be addressed. He did have his arterial study today which fortunately shows good findings  with a right ABI of 1.23 with a TBI of 0.86 in the left ABI of 1.28 with a TBI of 0.81. This is good news and will allow [place] to perform debridement as well. 10/23/2017; patient with a large wound on the left lateral calf, sizable area on the left medial malleolus and an area on the right lateral malleolus. He has a new blister consistent with his underlying blistering skin disease just above this area we have been using Iodoflex on the lateral left calf lateral right ankle and collagen on the medial left ankle. We have been using Kerlix Coban wraps 10/28/17 on evaluation today the patient continues to have signs of improvement  in regard to the overall appearance of the original wound. Unfortunately he did have some blistering over the right lateral lower extremity which has appeared to rupture on evaluation today and likely some of the dead tissue on the surface needs to be cleaned away the good news is this does not appear to be to significantly deep at this time. 11/04/17 on evaluation today patient actually appears to be doing a little better in regard to his lower extremity ulcers. He has been tolerating the dressing changes without complication. With that being said he does still have a significant wound especially over the left lateral lower extremity unfortunately. All of the wounds pretty much are going to require sharp debridement today. 11/11/17 on evaluation today patient appears to be doing more poorly in regard to his left lower extremity in particular. There does not appear to be any evidence of systemic infection although the wound itself as far as the larger left lateral lower extremity ulcer actually appears to be infected in my pinion. There's an older and the surface of the wound is dramatically worsened compared to last week. No fevers, chills, nausea, or vomiting noted at this time. 11/18/17 upon evaluation today patient actually appears to be doing better. I did review his culture  today which really did not show any specific organism is a positive reason for his wound decline. There are multiple organisms present not predominant. Nonetheless he seems to be tolerate the doxycycline well in his wounds in general do seem to be doing better. Fortunately there does not appear to be any evidence of infection at this time which is good news. Overall I'm very pleased with how things appear. Nonetheless he still has a lot of healing to go. I do think he could benefit from a Juxta-Lite wrap. 11/25/17 on evaluation today patient actually appears to be doing fairly well in regard to his wounds. He is still taking the antibiotics he has a few days left. Fortunately this seems to have been excellent for him as far as getting the infection control and very happy in this regard. With that being said the patient likewise is also very pleased with how things appear at this time in comparison to where we were he's not having as much pain. 12/02/17 Seen today for follow p and management of LLE wounds. Wounds appear to show some improvement. He denies pain, fever, or chills. Completed a course of doxycycline earlier this month. Scheduled to received Juxta-Lite wrap this week. No s/s of infections. 12/09/17 on evaluation today patient actually appears to be doing a little bit better in regard to his wounds. This is obscene very slow process and unfortunately he has a couple of new areas and this is due to the Epidermolysis Bullosa. Nonetheless I am concerned about the fact that he seems to be getting more areas not less that is the reason we're gonna work on getting schedule for the vascular referral to see the venous specialist. 12/23/17 upon evaluation today patient's wounds currently shows evidence of still not doing quite as well is what I would like to have seen. Subsequently the patient did have his venous study which showed evidence of venous stasis. Subsequently I do think that a vascular  evaluation for consideration of venous intervention would be appropriate. I'm not necessarily suggesting that will be anything that can be done but I think it is at least a good idea. He is in agreement with this plan. 12/30/17 on evaluation today patient actually appears  to be doing very well in regard to his wounds when compared to previous evaluation. Subsequently we have been using the [facility] Dressing which actually appears to have done excellent on his left lateral lower extremity ulcer. The quality of the wound surface is dramatically improved. There is some slight debridement that is going to be required at a couple of locations but overall I'm extremely pleased with how things appear here. 01/07/2018; this is a patient with a primary skin disorder epidermolyis bullosa. Is a large wound on the left lateral calf and smaller wounds on the right however there is a new wound on the right mid tibia area that occurred within the compression wrap that he did not change. We have been using Hydrofera Blue. On the left he is using Hydrofera Blue and Santyl to the inferior part of the wound and changing the dressing himself. 01/15/2018; primary skin disorder epidermolysis bullosa. He has several difficult wounds including the left lateral calf, smaller wounds on the left medial calf and the right lateral calf. The major area on the left lateral calf has a smaller area inferiorly that has necrotic debris we have been using Santyl to this. The rest of the wounds we have been using Hydrofera Blue. The area on the left calf actually looks larger this week. Uncontrolled edema several small open areas above it that are superficial 01/20/18 on evaluation today patient appears to be doing better as compared to last week in regard to his wounds of the bilateral lower extremities. He tolerated the bilateral compression wrap without complication. Overall I'm very pleased with how things appear at this time. The  patient likewise is very happy. 01/27/18 on evaluation today patient appears to be doing decently well in regard to his bilateral lower extremity ulcers. He's been tolerating the dressing changes without complication. One issue he had is that he did have more drainage to the left leg wrapped last week. He states in fact he probably should come in and let us change it on Friday however he just left it in place and kept adding extra absorption with ABD pads to the external portion of the wrap. Unfortunately he does have some aspiration type breakdown nothing significant but I do believe that this was probably counterproductive in general. Nonetheless his wounds do not appear to be terrible overall. 02/03/18 on evaluation today patient appears to be doing rather well in regard to his lower extremity ulcers. He has been tolerating the dressing changes without complication. He does tell me that he had to change the wrap on the left one since we last saw him. Subsequently I do not see any evidence of infection I do feel like the food was much better controlled at this White. 02/10/18 on evaluation today patient appears to be doing rather well in regard to his ulcers. He still has significant alterations especially on the left lateral lower extremity. Fortunately there's no signs of infection at this time. Overall I feel like he is making good progress are some areas that I'm gonna attempt some debridement today. 02/17/18 on evaluation today patient appears to be doing okay in regard to his lower Trinity ulcer. It does appear on both locations he has a little bit of drainage causing some breakdown in maceration around the wound bed's although it doesn't appear to be too bad the right is a little bit worse than left. Fortunately there's no signs of infection which is good news. No fevers, chills, nausea, or vomiting noted at  this time. 03/10/18 on evaluation today patient actually appears to be doing rather poorly  in regard to his bilateral lower extremity ulcers. The right in particular is draining profusely and the wound is actually enlarging which is not good. I'm concerned about both possibly infection and the fact that there's a lot of moisture which is causing breakdown as well. Unfortunately the patient has been trying to change this at home I'm afraid he may need to change more frequently in order to see the improvement that were looking for. There's no signs of systemic infection. 03/17/18 patient actually appears to be doing significantly better at this White in regard to his bilateral lower extremity ulcers. Fortunately there's no signs of infection. That is worsening infection at least indefinitely nothing systemic. With that being said he is having a lot of drainage though not quite as much is there in his last evaluation. Overall feel like he's on the side of improvement. I think if his results back from his culture which showed that he had a positive group B strep along with abundant Pseudomonas noted on the culture. For that reason I am gonna have him continue with the linezolid as we previously have ordered for him and I did go ahead as well today and prescribe Levaquin as well in order to treat the Pseudomonas portion of the infection noted. 03/24/18 on evaluation today patient actually appears to be doing very well in regard to his lower Trinity ulcer. He's been tolerating the dressing changes without complication. Fortunately both legs show signs of less drainage in his edema is very well controlled at this White as well. Overall very pleased with how things seem to be progressing. 03/31/18 on evaluation today patient actually appears to be doing excellent in regard to his bilateral lower extremity ulcers. These are not draining nearly as significant as what they were in the past overall seem to be shown signs of excellent improvement which is great news. Fortunately there is no sign of  active infection at this time I do believe that the Levaquin has done extremely well for him in this regard. The patient continues to change these at home typically every day. We may be able to slowly work towards every other day changes since the drainage seems to be slowing down quite significantly. 04/14/18 on evaluation today patient appears to be doing well in regard to his bilateral lower extremities. Let me [facility] Almost completely healed which is excellent news. Fortunately he's shown signs of improvement all other sites as well with new skin growth there's some slight hyper granular tissue but for the most part this seems to be well maintained with the [facility] Dressing. I'm very happy in this regard. 04/28/18 on evaluation today patient appears to be doing rather well in regard to his ulcers of the bilateral lower extremities all things considering. He continues to make some progress as far as new skin growth. There still some hyper granulation noted at this White despite the use of the [facility] Dressing. This is not terrible but I think we may want to consider conclude cauterization today with silver nitrate to try to help knock some of his back as well as helping with any biofilm on the surface of the wound. 05/12/18 on evaluation today patient's wounds actually appear to be doing fairly well in regard to the bilateral lower extremities. He's been tolerating the dressing changes without complication. Fortunately there's no signs of active infection at this time which is good  news. Overall very pleased with how things seem to be progressing. You select silver nitrate was beneficial for him. 05/26/18 on evaluation today patient appears to be doing better in regard to left lower extremity and a little bit worse in regard to the right lower extremity. He states that he was pulling off the [facility] Dressing peel back some of the skin making this area significantly larger than  what it was previous. He's not had any issues other than this and states even that hasn't caused any pain he just seems to obviously have a much larger area on the right when compared to the previous time I saw him. No fevers, chills, nausea, or vomiting noted at this time. 06/16/18 on evaluation today patient actually appears to be doing a little better in my pinion in regard to his lower summary ulcers. He has new skin islands that seem to be spreading which is good news. Fortunately there's no signs of active infection at this time. His biggest issue is he tells me that coming as often as he does is becoming very cost prohibitive. He wonders if we can potentially spread this out. 07/14/18 on evaluation today patient appears to be doing a little bit worse in regard to his lower from the ulcer. Unfortunately he still continues to have a significant amount of drainage I think we need to do something to try to help this more. He is still somewhat reluctant to go the route of the Wound VAC although that may be the most appropriate thing for him. No fevers, chills, nausea, or vomiting noted at this time. 08/18/2018 on evaluation today patient actually appears to be doing quite well with regard to his bilateral lower extremity ulcers. I do feel like that currently he is making great progress the care max does seem to be doing a great job at helping to control the moisture he has no maceration or skin breakdown. Again this seems to be an excellent way to go. 1 thing we may want to change is adding collagen to the base of the wound and then the care max over top he is not opposed to this. 09/15/2018 on evaluation today patient appears to be doing well with regard to his bilateral lower extremity ulcers. He is showing some signs of improvement not necessarily in size but definitely in appearance. In fact he has a lot of new skin growing throughout the wounds along the edges as well as in the central portion of the  wounds on both lower extremities. Overall I am extremely pleased to see this. 10/20/2018 on evaluation today patient actually appears to be doing quite well with regard to his wounds. They are not measuring significantly smaller but he does have a lot of new epithelization noted as compared to previous. Fortunately there is no signs of active infection at this time. No fevers, chills, nausea, vomiting, or diarrhea. 11/17/2018 on evaluation today patient presents for reevaluation concerning his bilateral lower extremity ulcers. Fortunately there is no signs of active infection at this time today. He has been tolerating the dressing changes without complication. No fevers, chills, nausea, vomiting, or diarrhea. Unfortunately in general the patient has not made as much improvement as I would like to have seen up to this White. He has been tolerating the dressing changes without complication and he does an excellent job taking care of his wounds at home in my opinion. The biggest issue I see is that he is just not making the progress  that we need to be seeing currently. I think we may want to consider having him seen at a plastic surgery appointment and he has previously seen someone in years past at [facility] in [place]. That is definitely a possibility for [place] to look into at this White. 12/29/2018 on evaluation today patient appears to be doing better in regard to the overall visual appearance of his wounds which do not appear to be as macerated. He does have a much larger skin island in the middle of the left lower extremity ulcer which is doing much better. He tells me the pain is also significantly better. With that being said overall his improvement as far as the size of the wounds is not better but again these are very irregular in change shape quite often. Fortunately there is no evidence of active infection at this time which is great news. He never heard anything from [facility]  regarding the plastic surgery referral that we made to them. 01/26/2019 upon evaluation today patient appears to be doing a little better in regard to his wounds today. He has been tolerating the dressing changes again he performs these for the most part on his own. He does a great job wrapping his legs in my opinion. Unfortunately he has not been able to get down to [facility] to see if there is anything from a plastic surgery standpoint that could be done to help with his legs simply due to the fact that his wife unfortunately sustained a compression fracture in her spine she is seeing Dr. Saintclair Halsted and subsequently is going to be having what sounds to be a kyphoplasty type procedure. With that being said that has not been scheduled yet there is still waiting on an MRI the patient is very busy in fact overly busy trying to help take care of his wife at this White. I completely understand this is more of a strain on him at this time 02/23/2019 upon evaluation today patient actually appears to be making some progress. I am actually very pleased with the overall appearance of his wounds even compared to last evaluation. He seems to be doing quite well. He is taking care of his wife unfortunately she did have a compression fracture she has had the procedure for this but still she has a slow road to recovery. For that reason he still not gone to [facility] for a second opinion in this regard. Obviously the goal there was if there was anything that can be done from a skin graft standpoint or otherwise. 03/23/2019 upon evaluation today patient continues to have issues with lower extremity ulcers. Since the beginning he has made progress but at the same time the wounds unfortunately just will not close. We have been trying to get the patient to [facility] to see a specialist there but unfortunately with the everything going on with his wife he has not been able to make that appointment time yet he  states he may be able to in the next 1-2 months but is not really sure. 04/27/2019 on evaluation today patient appears to be doing a little bit more poorly. His last evaluation. He appears to have some erythema around the edges of the wound at this White. Fortunately there is no signs of active infection at this time which is good news. No fevers, chills, nausea, vomiting, or diarrhea. 06/08/2019 on evaluation today patient appears to be doing well with regard to his wounds. Overall they are  actually measuring smaller compared to the last visit last month. We did treat him for Pseudomonas as well as methicillin-resistant Staph aureus. He was only on the treatment for MRSA however for 7 days as the Cipro was resistant and subsequently we had to place him on doxycycline. Nonetheless I am thinking that we may want to add the doxycycline and just do a month-long treatment considering the longstanding nature of his wounds and see if we get this under better control. 07/13/2019 upon evaluation today patient appears to be doing fairly well in regard to his bilateral lower extremities. There does not appear to be any signs of active infection which is good news. No fevers, chills, nausea, vomiting, or diarrhea. 08/10/2019 upon evaluation today patient appears to be doing about the same with regard to his wounds in general. Unfortunately he is not significantly better although is also not significantly worse which is great news there is no evidence of active infection at this time which is good news. He still dealing with a lot going on with his wife and therefore is not really able to go see anyone at the specialty clinic at [facility] that we have previously set up still. 09/07/2019 on evaluation today patient appears to be doing well with regard to his wounds. In fact this is probably the best that have seen so far in quite a few months. Overall I am very pleased with where things stand at this time. No fevers,  chills, nausea, vomiting, or diarrhea. 10/05/2019 upon evaluation today patient appears to be doing more poorly in regard to his legs at this White. He actually is showing some signs of infection. This has been something that we seem to be back-and-forth with as far as trying to keep these wounds from becoming infected. He takes care of them very well in my opinion but nonetheless I am concerned in this regard. He tells me that his wife is still doing really about the same she is slowly getting better. Nonetheless he still spends the majority of his time helping to take care of her. 11/02/2019 upon evaluation today patient actually appears to be doing somewhat better in regard to his legs. I do believe that the compounded antibiotic treatment from [facility] has been beneficial for him. Overall I am extremely pleased with where things stand today. There is no signs of active infection at this time. The patient states he has much less drainage than he has in the past. 12/07/2019 upon evaluation today patient appears to be doing well at this time in regard to the overall appearance of his wound bed. Currently there is no signs of active infection at this time. 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 Especially when he stressed. Fortunately there is no signs of active infection locally or systemically at this White. 01/18/2020 on evaluation today patient appears to be doing well with regard to his leg ulcers. He has been tolerating the dressing changes without complication. Fortunately there is no signs of infection and overall I feel like his legs are doing about the best they have done in quite some time. There does not appear to be any evidence of active infection which is great news and overall very pleased. 02/29/2020 upon evaluation today  patient's wounds actually appear to be doing quite well currently. There  is no sign of active infection at this time. No fevers, chills, nausea, vomiting, or diarrhea. 04/11/2025 on evaluation today patient appears to be doing excellent in regard to his wounds on the legs to be honest. He has made a lot of progress there does not appear to be any signs of active infection and overall I think that he is better than previous although still the wounds are quite significant obviously. In general I think that he is pleased with where things stand at this White. Still there is quite a bit of work to do here. 07/13/2020 upon evaluation today patient appears to be doing well with regard to his wound. Is been tolerating the dressing changes without complication on both legs. He just has the 2 main wounds on each leg at the ankle on the medial region of the right leg is completely healed. His wounds do seem to have gotten better during the time that he has been on bedrest as result of the pelvis fracture. Obviously does not the way that we wanted to see things improved but nonetheless he tells me that it is what it is. Fortunately he does seem to be doing better he tells me that her daughter from the [place] is actually down helping to take care of them out as he and his wife while he is recovering. 08/08/2020 upon evaluation today patient appears to be doing well currently in regard to his leg ulcers. He has been tolerating the dressing changes without complication. Fortunately there is no signs of active infection at this time. No fevers, chills, nausea, vomiting, or diarrhea. 09/26/2020 upon evaluation today patient appears to be doing well with regard to his wound. He has been showing signs of good improvement which is great news and overall I am extremely pleased with where things stand today. I do think that he does well with his dressing changes and overall has really improved significantly here. segment how  that how she doing 11/14/2020 upon evaluation today patient appears to be doing well with regard to his leg ulcers. He has been tolerating the dressing changes without complication. Fortunately there does not appear to be any signs of active infection at this time. No fevers, chills, nausea, vomiting, or diarrhea. 12/26/2020 upon evaluation today patient actually appears to be making some good progress here in regard to his wounds. Fortunately I do not see any signs of active infection which is great news. No fevers, chills, nausea, vomiting, or diarrhea. 02/06/2021 upon evaluation today patient appears to be doing well with regard to his wound currently. He has been tolerating the dressing changes without complication. Fortunately there does not appear to be any evidence of active infection locally nor systemically at this time. No fevers, chills, nausea, vomiting, or diarrhea. Overall I think that this is very slow but does seem to be getting a little bit better and a little smaller he has been through a lot of stress recently with his wife she is having a heart evaluation tomorrow to see whether or not an aortic valve replacement is something that she could tolerate and undergo. 04/03/2020 upon evaluation today patient's legs are actually showing signs of significant improvement. This is actually a dramatic overall improvement since I last saw him I think we are getting very close to closing these wounds. That is an amazing thing is we been dealing with this for quite some time. The patient is very pleased to hear things appear to be doing well as well. 05-15-2021  upon evaluation today patient appears to be doing well with regard to his wound on the bilateral lower extremities. Both are showing signs of excellent improvement which is great news. There does not appear to be any signs of active infection locally nor systemically at this time. No fevers, chills, nausea, vomiting, or diarrhea. 06-26-2021  upon evaluation today patient appears to be doing well with regard to his wounds on the legs. They are actually showing signs of improvement in my opinion which is great news. Fortunately I do not see any evidence of active infection locally or systemically which is great news. No fevers, chills, nausea, vomiting, or diarrhea. 08-21-2021 upon evaluation today patient appears to be making excellent progress. Has been using a mixture of topical antibiotic ointment followed by the ABD pads which does seem to be doing quite well. I do not see any evidence of active infection locally or systemically at this time which is great news. With that being said I think that he is very close to complete resolution. Objective Constitutional Well-nourished and well-hydrated in no acute distress. Vitals Time Taken: 1:00 PM, Height: 71 in, Weight: 220 lbs, BMI: 30.7, Temperature: 98 F, Pulse: 72 bpm, Respiratory Rate: 16 breaths/min, Blood Pressure: 173/76 mmHg. Respiratory normal breathing without difficulty. Psychiatric this patient is able to make decisions and demonstrates good insight into disease process. Alert and Oriented x 3. pleasant and cooperative. General Notes: Upon inspection patient's wound bed actually showed signs of good granulation epithelization at this White. Fortunately I do not see any signs of infection locally or systemically which is great news and overall I am extremely pleased with where things stand today. Integumentary (Hair, Skin) Wound #2 status is Open. Original cause of wound was Gradually Appeared. The date acquired was: 03/13/2017. The wound has been in treatment 203 weeks. The wound is located on the Left,Distal,Lateral Lower Leg. The wound measures 9cm length x 7.2cm width x 0.2cm depth; 50.894cm^2 area and 10.179cm^3 volume. There is Fat Layer (Subcutaneous Tissue) exposed. There is no tunneling or undermining noted. There is a medium amount of serosanguineous drainage noted.  The wound margin is distinct with the outline attached to the wound base. There is large (67-100%) red granulation within the wound bed. There is a small (1-33%) amount of necrotic tissue within the wound bed including Adherent Slough. Wound #5 status is Open. Original cause of wound was Gradually Appeared. The date acquired was: 10/26/2017. The wound has been in treatment 199 weeks. The wound is located on the Right,Lateral,Anterior Lower Leg. The wound measures 9.8cm length x 8cm width x 0.1cm depth; 61.575cm^2 area and 6.158cm^3 volume. There is Fat Layer (Subcutaneous Tissue) exposed. There is no tunneling or undermining noted. There is a medium amount of serosanguineous drainage noted. The wound margin is distinct with the outline attached to the wound base. There is large (67-100%) red granulation within the wound bed. There is no necrotic tissue within the wound bed. 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 Bathing/ Shower/ Hygiene: May shower and wash wound with soap and  water. - on days that dressing is changed Edema Control - Lymphedema / SCD / Other: Elevate legs to the level of the heart or above for 30 minutes daily and/or when sitting, a frequency of: - throughout the day Avoid standing for long periods of time. Exercise regularly Moisturize legs daily. Compression stocking or Garment 20-30 mm/Hg pressure to: - Juxtalite to both legs daily WOUND #2: -  Lower Leg Wound Laterality: Left, Lateral, Distal Cleanser: Soap and Water 3 x Per Week/30 Days Discharge Instructions: May shower and wash wound with dial antibacterial soap and water prior to dressing change. Peri-Wound Care: Zinc Oxide Ointment 30g tube 3 x Per Week/30 Days Discharge Instructions: Apply Zinc Oxide to periwound with each dressing change as needed for maceration Peri-Wound Care: Sween Lotion (Moisturizing lotion) 3 x Per Week/30 Days Discharge Instructions: Apply moisturizing lotion as directed Topical: neosporin 3 x Per Week/30 Days Discharge Instructions: apply three times a week. Secondary Dressing: ABD Pad, 5x9 (Generic) 3 x Per Week/30 Days Discharge Instructions: Apply over primary dressing as directed. Secured With: Elastic Bandage 4 inch (ACE bandage) 3 x Per Week/30 Days Discharge Instructions: Secure with ACE bandage as directed. Secured With: [employer], 4.5x3.1 (in/yd) (Generic) 3 x Per Week/30 Days Discharge Instructions: Secure with Kerlix as directed. WOUND #5: - Lower Leg Wound Laterality: Right, Lateral, Anterior Cleanser: Soap and Water 3 x Per Week/30 Days Discharge Instructions: May shower and wash wound with dial antibacterial soap and water prior to dressing change. Peri-Wound Care: Zinc Oxide Ointment 30g tube 3 x Per Week/30 Days Discharge Instructions: Apply Zinc Oxide to periwound with each dressing change as needed for maceration Peri-Wound Care: Sween Lotion (Moisturizing lotion) 3 x Per Week/30 Days Discharge Instructions: Apply moisturizing lotion as directed Topical: neosporin 3 x Per Week/30 Days Discharge Instructions: apply three times a week. Secondary Dressing: ABD Pad, 5x9 (Generic) 3 x Per Week/30 Days Discharge Instructions: Apply over primary dressing as directed. Secured With: Elastic Bandage 4 inch (ACE bandage) 3 x Per Week/30 Days Discharge Instructions: Secure with ACE bandage as directed. Secured With: [employer], 4.5x3.1 (in/yd) (Generic) 3 x Per Week/30 Days Discharge Instructions: Secure with Kerlix as directed. 1. I am going to recommend that we go ahead and continue with the wound care measures as before and the patient is in agreement with plan this includes the use of the topical triple antibiotic ointment which does seem to be doing quite well. 2. Will continue with the ABD pads to cover followed by the roll gauze to secure in place and then the Ace wraps which seem to be doing quite well. 3. The patient should continue to elevate his leg is much as possible obviously the more of this he can do the better to keep the swelling under good control. We will see patient back for reevaluation in 2 months here in the clinic. If anything worsens or changes patient will contact our office for additional recommendations. Electronic Signature(s) Signed: 08/21/2021 2:21:07 PM By: Worthy Keeler PA-C Entered By: Worthy Keeler on 08/21/2021 14:21:07 -------------------------------------------------------------------------------- SuperBill Details Patient Name: Date of Service: Carlos White 08/21/2021 Medical Record Number: [id number] Patient Account Number: [id number] Date of Birth/Sex: Treating RN: 1940/07/08 (81 y.o. [name] Primary Care Provider: [name] Other Clinician: Referring Provider: Treating Provider/Extender: [name] in Treatment: 203 Diagnosis Coding ICD-10 Codes Code Description I87.2 Venous insufficiency (chronic) (peripheral) Q81.8 Other epidermolysis bullosa L97.322 Non-pressure  chronic ulcer of left ankle with fat layer exposed L97.822 Non-pressure chronic ulcer of other part of left lower leg with fat layer exposed L97.812 Non-pressure chronic ulcer of other part of right lower leg with fat layer exposed I10 Essential (primary) hypertension Facility Procedures CPT4 Code: [id number] Description: 83462 - WOUND CARE VISIT-LEV 5 EST  PT Modifier: Quantity: 1 Physician Procedures : CPT4 Code Description Modifier 1947125 [id number] - WC PHYS LEVEL 3 - EST PT ICD-10 Diagnosis Description I87.2 Venous insufficiency (chronic) (peripheral) Q81.8 Other epidermolysis bullosa L97.322 Non-pressure chronic ulcer of left ankle with fat layer  exposed L97.822 Non-pressure chronic ulcer of other part of left lower leg with fat layer exposed Quantity: 1 Electronic Signature(s) Signed: 08/21/2021 2:29:19 PM By: Worthy Keeler PA-C Entered By: Worthy Keeler on 08/21/2021 14:29:18

## 2021-09-05 ENCOUNTER — Ambulatory Visit: Payer: Medicare PPO | Admitting: Physician Assistant

## 2021-09-17 DIAGNOSIS — X32XXXA Exposure to sunlight, initial encounter: Secondary | ICD-10-CM | POA: Diagnosis not present

## 2021-09-17 DIAGNOSIS — L570 Actinic keratosis: Secondary | ICD-10-CM | POA: Diagnosis not present

## 2021-09-17 DIAGNOSIS — C44319 Basal cell carcinoma of skin of other parts of face: Secondary | ICD-10-CM | POA: Diagnosis not present

## 2021-09-17 DIAGNOSIS — C44311 Basal cell carcinoma of skin of nose: Secondary | ICD-10-CM | POA: Diagnosis not present

## 2021-10-03 ENCOUNTER — Other Ambulatory Visit: Payer: Medicare PPO

## 2021-10-03 DIAGNOSIS — I10 Essential (primary) hypertension: Secondary | ICD-10-CM | POA: Diagnosis not present

## 2021-10-03 DIAGNOSIS — Z13220 Encounter for screening for lipoid disorders: Secondary | ICD-10-CM

## 2021-10-03 DIAGNOSIS — D649 Anemia, unspecified: Secondary | ICD-10-CM

## 2021-10-03 DIAGNOSIS — Q819 Epidermolysis bullosa, unspecified: Secondary | ICD-10-CM

## 2021-10-03 DIAGNOSIS — N400 Enlarged prostate without lower urinary tract symptoms: Secondary | ICD-10-CM

## 2021-10-04 ENCOUNTER — Other Ambulatory Visit: Payer: Self-pay | Admitting: Internal Medicine

## 2021-10-04 LAB — CBC WITH DIFFERENTIAL/PLATELET
Absolute Monocytes: 257 cells/uL (ref 200–950)
Basophils Absolute: 31 cells/uL (ref 0–200)
Basophils Relative: 0.8 %
Eosinophils Absolute: 12 cells/uL — ABNORMAL LOW (ref 15–500)
Eosinophils Relative: 0.3 %
HCT: 36 % — ABNORMAL LOW (ref 38.5–50.0)
Hemoglobin: 12.2 g/dL — ABNORMAL LOW (ref 13.2–17.1)
Lymphs Abs: 546 cells/uL — ABNORMAL LOW (ref 850–3900)
MCH: 33.1 pg — ABNORMAL HIGH (ref 27.0–33.0)
MCHC: 33.9 g/dL (ref 32.0–36.0)
MCV: 97.6 fL (ref 80.0–100.0)
MPV: 9 fL (ref 7.5–12.5)
Monocytes Relative: 6.6 %
Neutro Abs: 3054 cells/uL (ref 1500–7800)
Neutrophils Relative %: 78.3 %
Platelets: 200 10*3/uL (ref 140–400)
RBC: 3.69 10*6/uL — ABNORMAL LOW (ref 4.20–5.80)
RDW: 11.8 % (ref 11.0–15.0)
Total Lymphocyte: 14 %
WBC: 3.9 10*3/uL (ref 3.8–10.8)

## 2021-10-04 LAB — LIPID PANEL
Cholesterol: 136 mg/dL (ref <200)
HDL: 58 mg/dL (ref >=40)
LDL Cholesterol (Calc): 69 mg/dL (calc)
Non-HDL Cholesterol (Calc): 78 mg/dL (calc) (ref <130)
Total CHOL/HDL Ratio: 2.3 (calc) (ref <5.0)
Triglycerides: 31 mg/dL (ref <150)

## 2021-10-04 LAB — COMPLETE METABOLIC PANEL WITH GFR
AG Ratio: 1.8 (calc) (ref 1.0–2.5)
ALT: 10 U/L (ref 9–46)
AST: 12 U/L (ref 10–35)
Albumin: 4.1 g/dL (ref 3.6–5.1)
Alkaline phosphatase (APISO): 57 U/L (ref 35–144)
BUN: 11 mg/dL (ref 7–25)
CO2: 26 mmol/L (ref 20–32)
Calcium: 9.4 mg/dL (ref 8.6–10.3)
Chloride: 101 mmol/L (ref 98–110)
Creat: 0.89 mg/dL (ref 0.70–1.22)
Globulin: 2.3 g/dL (calc) (ref 1.9–3.7)
Glucose, Bld: 104 mg/dL — ABNORMAL HIGH (ref 65–99)
Potassium: 4.6 mmol/L (ref 3.5–5.3)
Sodium: 136 mmol/L (ref 135–146)
Total Bilirubin: 0.4 mg/dL (ref 0.2–1.2)
Total Protein: 6.4 g/dL (ref 6.1–8.1)
eGFR: 86 mL/min/{1.73_m2} (ref >=60)

## 2021-10-04 LAB — PSA: PSA: 0.75 ng/mL (ref <=4.00)

## 2021-10-08 ENCOUNTER — Ambulatory Visit: Payer: Medicare PPO | Admitting: Internal Medicine

## 2021-10-08 ENCOUNTER — Encounter: Payer: Self-pay | Admitting: Internal Medicine

## 2021-10-08 VITALS — BP 130/64 | HR 74 | Temp 97.8°F | Ht 70.0 in | Wt 180.4 lb

## 2021-10-08 DIAGNOSIS — Q819 Epidermolysis bullosa, unspecified: Secondary | ICD-10-CM | POA: Diagnosis not present

## 2021-10-08 DIAGNOSIS — I10 Essential (primary) hypertension: Secondary | ICD-10-CM

## 2021-10-08 DIAGNOSIS — Z23 Encounter for immunization: Secondary | ICD-10-CM | POA: Diagnosis not present

## 2021-10-08 DIAGNOSIS — Z0000 Encounter for general adult medical examination without abnormal findings: Secondary | ICD-10-CM

## 2021-10-08 DIAGNOSIS — Z8781 Personal history of (healed) traumatic fracture: Secondary | ICD-10-CM

## 2021-10-08 DIAGNOSIS — D649 Anemia, unspecified: Secondary | ICD-10-CM

## 2021-10-08 DIAGNOSIS — Z8249 Family history of ischemic heart disease and other diseases of the circulatory system: Secondary | ICD-10-CM

## 2021-10-08 DIAGNOSIS — Z87438 Personal history of other diseases of male genital organs: Secondary | ICD-10-CM

## 2021-10-08 LAB — POCT URINALYSIS DIPSTICK
Bilirubin, UA: NEGATIVE
Glucose, UA: NEGATIVE
Ketones, UA: NEGATIVE
Leukocytes, UA: NEGATIVE
Nitrite, UA: NEGATIVE
Protein, UA: NEGATIVE
Spec Grav, UA: 1.01 (ref 1.010–1.025)
Urobilinogen, UA: 0.2 E.U./dL
pH, UA: 7 (ref 5.0–8.0)
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
## 2021-10-08 NOTE — Progress Notes (Signed)
     Annual Wellness Visit     Patient: Carlos White., Male    DOB: November 06, 1940, 81 y.o.   MRN: [id number] Visit Date: 10/08/2021  Chief Complaint  Patient presents with   Medicare Wellness   Annual Exam   Subjective    Carlos White. is a 81 y.o. male who presents today for his Annual Wellness Visit.  HPI   Social History   Social History Narrative   Not on file    Patient Care Team: Javyn Havlin, Cresenciano Lick, MD as PCP - General (Internal Medicine)  Review of Systems   Objective    Vitals: BP 130/64   Pulse 74   Temp 97.8 F (36.6 C) (Tympanic)   Ht '5\' 10"'$  (1.778 m)   Wt 180 lb 6.4 oz (81.8 kg)   SpO2 97%   BMI 25.88 kg/m   Physical Exam   Most recent functional status assessment:    10/08/2021   10:04 AM  In your present state of health, do you have any difficulty performing the following activities:  Hearing? 1  Vision? 0  Difficulty concentrating or making decisions? 1  Walking or climbing stairs? 1  Dressing or bathing? 0  Doing errands, shopping? 0  Preparing Food and eating ? N  Using the Toilet? N  In the past six months, have you accidently leaked urine? N  Do you have problems with loss of bowel control? N  Managing your Medications? N  Managing your Finances? N  Housekeeping or managing your Housekeeping? N   Most recent fall risk assessment:    10/08/2021   10:01 AM  Fall Risk   Falls in the past year? 0  Number falls in past yr: 0  Injury with Fall? 0  Risk for fall due to : No Fall Risks  Follow up Falls evaluation completed    Most recent depression screenings:    10/08/2021   10:02 AM 10/03/2020    1:15 PM  PHQ 2/9 Scores  PHQ - 2 Score 0 0   Most recent cognitive screening:    10/08/2021   10:05 AM  6CIT Screen  What Year? 0 points  What month? 0 points  What time? 0 points  Count back from 20 0 points  Months in reverse 0 points  Repeat phrase 0 points  Total Score 0 points       Assessment & Plan      Annual wellness visit done today including the all of the following: Reviewed patient's Family Medical History Reviewed and updated list of patient's medical providers Assessment of cognitive impairment was done Assessed patient's functional ability Established a written schedule for health screening [place] Completed and Reviewed  Discussed health benefits of physical activity, and encouraged him to engage in regular exercise appropriate for his age and condition.         IElby Showers, MD, have reviewed all documentation for this visit. The documentation on 10/12/21 for the exam, diagnosis, procedures, and orders are all accurate and complete.   LaVon Barron Alvine, CMA

## 2021-10-09 ENCOUNTER — Encounter (HOSPITAL_BASED_OUTPATIENT_CLINIC_OR_DEPARTMENT_OTHER): Payer: Medicare PPO | Attending: Physician Assistant | Admitting: Physician Assistant

## 2021-10-09 DIAGNOSIS — L97812 Non-pressure chronic ulcer of other part of right lower leg with fat layer exposed: Secondary | ICD-10-CM | POA: Diagnosis not present

## 2021-10-09 DIAGNOSIS — Q819 Epidermolysis bullosa, unspecified: Secondary | ICD-10-CM | POA: Diagnosis not present

## 2021-10-09 DIAGNOSIS — I10 Essential (primary) hypertension: Secondary | ICD-10-CM | POA: Insufficient documentation

## 2021-10-09 DIAGNOSIS — L97322 Non-pressure chronic ulcer of left ankle with fat layer exposed: Secondary | ICD-10-CM | POA: Diagnosis not present

## 2021-10-09 DIAGNOSIS — L97822 Non-pressure chronic ulcer of other part of left lower leg with fat layer exposed: Secondary | ICD-10-CM | POA: Insufficient documentation

## 2021-10-09 DIAGNOSIS — I872 Venous insufficiency (chronic) (peripheral): Secondary | ICD-10-CM | POA: Insufficient documentation

## 2021-10-09 NOTE — Progress Notes (Signed)
[name], [name] ([id number]) Visit Report for 10/09/2021 Arrival Information Details Patient Name: Date of Service: Carlos White 10/09/2021 12:45 PM Medical Record Number: [id number] Patient Account Number: [id number] Date of Birth/Sex: Treating RN: [birth year] (81 y.o. Carlos White, [name] Primary Care Lavoris Sparling: [name] Other Clinician: Referring Darnell Jeschke: Treating Goddess Gebbia/Extender: [name] in Treatment: 210 Visit Information History Since Last Visit Added or deleted any medications: No Patient Arrived: Ambulatory Any new allergies or adverse reactions: No Arrival Time: 12:50 Had a fall or experienced change in No Accompanied By: self activities of daily living that may affect Transfer Assistance: None risk of falls: Patient Identification Verified: Yes Signs or symptoms of abuse/neglect since last visito No Secondary Verification Process Completed: Yes Hospitalized since last visit: No Patient Requires Transmission-Based Precautions: No Implantable device outside of the clinic excluding No Patient Has Alerts: Yes cellular tissue based products placed in the center Patient Alerts: R ABI= 1.23, TBI = .86 since last visit: L ABI= 1.28, TBI=.81 Has Dressing in Place as Prescribed: Yes Pain Present Now: No Electronic Signature(s) Signed: 10/09/2021 1:33:45 PM By: [name] RN, BSN Entered By: [name] on 10/09/2021 12:50:51 -------------------------------------------------------------------------------- Clinic Level of Care Assessment Details Patient Name: Date of Service: Carlos White 10/09/2021 12:45 PM Medical Record Number: [id number] Patient Account Number: [id number] Date of Birth/Sex: Treating RN: 12/30/1940 (81 y.o. Carlos White Primary Care Atari Novick: [name] Other Clinician: Referring Geovany Trudo: Treating Shanterria Franta/Extender: [name] in Treatment: 210 Clinic Level of Care Assessment  Items TOOL 4 Quantity Score X- 1 0 Use when only an EandM is performed on FOLLOW-UP visit ASSESSMENTS - Nursing Assessment / Reassessment X- 1 10 Reassessment of Co-morbidities (includes updates in patient status) X- 1 5 Reassessment of Adherence to Treatment Plan ASSESSMENTS - Wound and Skin A ssessment / Reassessment []  - 0 Simple Wound Assessment / Reassessment - one wound X- 2 5 Complex Wound Assessment / Reassessment - multiple wounds X- 1 10 Dermatologic / Skin Assessment (not related to wound area) ASSESSMENTS - Focused Assessment X- 2 5 Circumferential Edema Measurements - multi extremities []  - 0 Nutritional Assessment / Counseling / Intervention []  - 0 Lower Extremity Assessment (monofilament, tuning fork, pulses) []  - 0 Peripheral Arterial Disease Assessment (using hand held doppler) ASSESSMENTS - Ostomy and/or Continence Assessment and Care []  - 0 Incontinence Assessment and Management []  - 0 Ostomy Care Assessment and Management (repouching, etc.) PROCESS - Coordination of Care []  - 0 Simple Patient / Family Education for ongoing care X- 1 20 Complex (extensive) Patient / Family Education for ongoing care X- 1 10 Staff obtains Programmer, systems, Records, T Results / Process Orders est []  - 0 Staff telephones HHA, Nursing Homes / Clarify orders / etc []  - 0 Routine Transfer to another Facility (non-emergent condition) []  - 0 Routine Hospital Admission (non-emergent condition) []  - 0 New Admissions / Biomedical engineer / Ordering NPWT Apligraf, etc. , []  - 0 Emergency Hospital Admission (emergent condition) []  - 0 Simple Discharge Coordination X- 1 15 Complex (extensive) Discharge Coordination PROCESS - Special Needs []  - 0 Pediatric / Minor Patient Management []  - 0 Isolation Patient Management []  - 0 Hearing / Language / Visual special needs []  - 0 Assessment of Community assistance (transportation, D/C planning, etc.) []  - 0 Additional  assistance / Altered mentation []  - 0 Support Surface(s) Assessment (bed, cushion, seat, etc.) INTERVENTIONS - Wound Cleansing / Measurement []  - 0  Simple Wound Cleansing - one wound X- 2 5 Complex Wound Cleansing - multiple wounds X- 1 5 Wound Imaging (photographs - any number of wounds) []  - 0 Wound Tracing (instead of photographs) []  - 0 Simple Wound Measurement - one wound X- 2 5 Complex Wound Measurement - multiple wounds INTERVENTIONS - Wound Dressings []  - 0 Small Wound Dressing one or multiple wounds X- 2 15 Medium Wound Dressing one or multiple wounds []  - 0 Large Wound Dressing one or multiple wounds []  - 0 Application of Medications - topical []  - 0 Application of Medications - injection INTERVENTIONS - Miscellaneous []  - 0 External ear exam []  - 0 Specimen Collection (cultures, biopsies, blood, body fluids, etc.) []  - 0 Specimen(s) / Culture(s) sent or taken to Lab for analysis []  - 0 Patient Transfer (multiple staff / Civil Service fast streamer / Similar devices) []  - 0 Simple Staple / Suture removal (25 or less) []  - 0 Complex Staple / Suture removal (26 or more) []  - 0 Hypo / Hyperglycemic Management (close monitor of Blood Glucose) []  - 0 Ankle / Brachial Index (ABI) - do not check if billed separately X- 1 5 Vital Signs Has the patient been seen at the hospital within the last three years: Yes Total Score: 150 Level Of Care: New/Established - Level 4 Electronic Signature(s) Signed: 10/09/2021 1:33:45 PM By: [name] RN, BSN Entered By: [name] on 10/09/2021 13:05:36 -------------------------------------------------------------------------------- Encounter Discharge Information Details Patient Name: Date of Service: Carlos White, GO RDO Carlos White. 10/09/2021 12:45 PM Medical Record Number: [id number] Patient Account Number: [id number] Date of Birth/Sex: Treating RN: 1940/07/29 (81 y.o. Carlos White Primary Care Declin Rajan: [name] Other  Clinician: Referring Jennae Hakeem: Treating Tuwana Kapaun/Extender: [name] in Treatment: [phone] Encounter Discharge Information Items Discharge Condition: Stable Ambulatory Status: Ambulatory Discharge Destination: Home Transportation: Private Auto Accompanied By: self Schedule Follow-up Appointment: Yes Clinical Summary of Care: Electronic Signature(s) Signed: 10/09/2021 1:33:45 PM By: [name] RN, BSN Entered By: [name] on 10/09/2021 13:06:12 -------------------------------------------------------------------------------- Lower Extremity Assessment Details Patient Name: Date of Service: Carlos White 10/09/2021 12:45 PM Medical Record Number: [id number] Patient Account Number: [id number] Date of Birth/Sex: Treating RN: 1940/12/18 (81 y.o. Carlos White Primary Care Deyna Carbon: [name] Other Clinician: Referring Nicandro Perrault: Treating Amias Hutchinson/Extender: [name] in Treatment: 210 Edema Assessment Assessed: Shirlyn Goltz: Yes] Patrice Paradise: Yes] Edema: [Left: Yes] [Right: Yes] Calf Left: Right: Point of Measurement: 35.5 cm From Medial Instep 34 cm 34 cm Ankle Left: Right: Point of Measurement: 11 cm From Medial Instep 25 cm 23 cm Vascular Assessment Pulses: Dorsalis Pedis Palpable: [Left:Yes] [Right:Yes] Electronic Signature(s) Signed: 10/09/2021 1:33:45 PM By: [name] RN, BSN Entered By: [name] on 10/09/2021 12:54:52 -------------------------------------------------------------------------------- [place] Details Patient Name: Date of Service: [name] RDO Carlos White. 10/09/2021 12:45 PM Medical Record Number: [id number] Patient Account Number: [id number] Date of Birth/Sex: Treating RN: May 10, 1940 (81 y.o. Carlos White Primary Care Kyrell Ruacho: [name] Other Clinician: Referring Shamal Stracener: Treating Jacoya Bauman/Extender: [name] in Treatment: [place] reviewed with physician Active Inactive Venous Leg Ulcer Nursing Diagnoses: Knowledge deficit related to disease process and management Potential for venous Insuffiency (use before diagnosis confirmed) Goals: Patient will maintain optimal edema control Date Initiated: 09/30/2017 Target Resolution Date: 12/13/2021 Goal Status: Active Patient/caregiver will verbalize understanding of disease process and disease management Date Initiated: 09/30/2017 Date Inactivated: 11/04/2017 Target Resolution Date: 10/30/2017 Goal  Status: Met Interventions: Assess peripheral edema status every visit. Provide education on venous insufficiency Notes: Electronic Signature(s) Signed: 10/09/2021 1:33:45 PM By: [name] RN, BSN Entered By: [name] on 10/09/2021 12:57:22 -------------------------------------------------------------------------------- Pain Assessment Details Patient Name: Date of Service: Carlos White 10/09/2021 12:45 PM Medical Record Number: [id number] Patient Account Number: [id number] Date of Birth/Sex: Treating RN: 10/23/1940 (81 y.o. Carlos White Primary Care Aaliayah Miao: [name] Other Clinician: Referring Cidney Kirkwood: Treating Meena Barrantes/Extender: [name] in Treatment: [phone] Active Problems Location of Pain Severity and Description of Pain Patient Has Paino No Site Locations Rate the pain. Current Pain Level: 0 Pain Management and Medication Current Pain Management: Medication: No Cold Application: No Rest: No Massage: No Activity: No T.E.N.S.: No Heat Application: No Leg drop or elevation: No Is the Current Pain Management Adequate: Adequate How does your wound impact your activities of daily livingo Sleep: No Bathing: No Appetite: No Relationship With Others: No Bladder Continence: No Emotions: No Bowel Continence: No Work: No Toileting: No Drive: No Dressing: No Hobbies: No Electronic  Signature(s) Signed: 10/09/2021 1:33:45 PM By: [name] RN, BSN Entered By: [name] on 10/09/2021 12:51:03 -------------------------------------------------------------------------------- Wound Assessment Details Patient Name: Date of Service: [name] RDO Carlos White. 10/09/2021 12:45 PM Medical Record Number: [id number] Patient Account Number: [id number] Date of Birth/Sex: Treating RN: [birth year] (81 y.o. Carlos White Primary Care Ramsha Lonigro: [name] Other Clinician: Referring Yorley Buch: Treating Lehi Phifer/Extender: [name] in Treatment: 210 Wound Status Wound Number: 2 Primary Venous Leg Ulcer Etiology: Wound Location: Left, Distal, Lateral Lower Leg Wound Status: Open Wounding Event: Gradually Appeared Comorbid Cataracts, Chronic Obstructive Pulmonary Disease (COPD), Date Acquired: 03/13/2017 History: Hypertension Weeks Of Treatment: 210 Clustered Wound: Yes Photos Wound Measurements Length: (cm) 11 Width: (cm) 8 Depth: (cm) 0.2 Clustered Quantity: 1 Area: (cm) 69.115 Volume: (cm) 13.823 % Reduction in Area: 10.8% % Reduction in Volume: 64.3% Epithelialization: Medium (34-66%) Tunneling: No Undermining: No Wound Description Classification: Full Thickness Without Exposed Support Stru Wound Margin: Distinct, outline attached Exudate Amount: Medium Exudate Type: Serosanguineous Exudate Color: red, brown ctures Foul Odor After Cleansing: No Slough/Fibrino Yes Wound Bed Granulation Amount: Large (67-100%) Exposed Structure Granulation Quality: Red Fascia Exposed: No Necrotic Amount: Small (1-33%) Fat Layer (Subcutaneous Tissue) Exposed: Yes Necrotic Quality: Adherent Slough Tendon Exposed: No Muscle Exposed: No Joint Exposed: No Bone Exposed: No Treatment Notes Wound #2 (Lower Leg) Wound Laterality: Left, Lateral, Distal Cleanser Soap and Water Discharge Instruction: May shower and wash wound with dial antibacterial soap  and water prior to dressing change. Peri-Wound Care Zinc Oxide Ointment 30g tube Discharge Instruction: Apply Zinc Oxide to periwound with each dressing change as needed for maceration Sween Lotion (Moisturizing lotion) Discharge Instruction: Apply moisturizing lotion as directed Topical neosporin Discharge Instruction: apply three times a week. Primary Dressing Secondary Dressing ABD Pad, 5x9 Discharge Instruction: Apply over primary dressing as directed. Secured With Elastic Bandage 4 inch (ACE bandage) Discharge Instruction: Secure with ACE bandage as directed. Kerlix Roll Sterile, 4.5x3.1 (in/yd) Discharge Instruction: Secure with Kerlix as directed. Compression Wrap Compression Stockings Add-Ons Electronic Signature(s) Signed: 10/09/2021 1:33:45 PM By: [name] RN, BSN Entered By: [name] on 10/09/2021 12:56:38 -------------------------------------------------------------------------------- Wound Assessment Details Patient Name: Date of Service: Carlos White 10/09/2021 12:45 PM Medical Record Number: [id number] Patient Account Number: [id number] Date of Birth/Sex: Treating RN: July 28, 1940 (81 y.o. Carlos White Primary Care Deryn Massengale: [name] Other Clinician:  Referring Tatanisha Cuthbert: Treating Cayleigh Paull/Extender: [name] in Treatment: 210 Wound Status Wound Number: 5 Primary Vasculopathy Etiology: Wound Location: Right, Lateral, Anterior Lower Leg Wound Status: Open Wounding Event: Gradually Appeared Comorbid Cataracts, Chronic Obstructive Pulmonary Disease (COPD), Date Acquired: 10/26/2017 History: Hypertension Weeks Of Treatment: 206 Clustered Wound: Yes Photos Wound Measurements Length: (cm) 7 Width: (cm) 11 Depth: (cm) 0.1 Clustered Quantity: 4 Area: (cm) 60.476 Volume: (cm) 6.048 % Reduction in Area: -11.4% % Reduction in Volume: -11.4% Epithelialization: Large (67-100%) Tunneling: No Undermining:  No Wound Description Classification: Full Thickness Without Exposed Support Stru Wound Margin: Distinct, outline attached Exudate Amount: Medium Exudate Type: Serosanguineous Exudate Color: red, brown ctures Foul Odor After Cleansing: No Slough/Fibrino Yes Wound Bed Granulation Amount: Large (67-100%) Exposed Structure Granulation Quality: Red Fascia Exposed: No Necrotic Amount: None Present (0%) Fat Layer (Subcutaneous Tissue) Exposed: Yes Tendon Exposed: No Muscle Exposed: No Joint Exposed: No Bone Exposed: No Treatment Notes Wound #5 (Lower Leg) Wound Laterality: Right, Lateral, Anterior Cleanser Soap and Water Discharge Instruction: May shower and wash wound with dial antibacterial soap and water prior to dressing change. Peri-Wound Care Zinc Oxide Ointment 30g tube Discharge Instruction: Apply Zinc Oxide to periwound with each dressing change as needed for maceration Sween Lotion (Moisturizing lotion) Discharge Instruction: Apply moisturizing lotion as directed Topical neosporin Discharge Instruction: apply three times a week. Primary Dressing Secondary Dressing ABD Pad, 5x9 Discharge Instruction: Apply over primary dressing as directed. Secured With Elastic Bandage 4 inch (ACE bandage) Discharge Instruction: Secure with ACE bandage as directed. Kerlix Roll Sterile, 4.5x3.1 (in/yd) Discharge Instruction: Secure with Kerlix as directed. Compression Wrap Compression Stockings Add-Ons Electronic Signature(s) Signed: 10/09/2021 1:33:45 PM By: [name] RN, BSN Entered By: [name] on 10/09/2021 12:56:56 -------------------------------------------------------------------------------- Vitals Details Patient Name: Date of Service: Carlos White, GO RDO Carlos White 10/09/2021 12:45 PM Medical Record Number: [id number] Patient Account Number: [id number] Date of Birth/Sex: Treating RN: Sep 15, 1940 (81 y.o. Carlos White Primary Care Naleah Kofoed: [name] Other  Clinician: Referring Steffon Gladu: Treating Talajah Slimp/Extender: [name] in Treatment: 210 Vital Signs Time Taken: 12:50 Temperature (F): 98.2 Height (in): 71 Pulse (bpm): 71 Weight (lbs): 220 Respiratory Rate (breaths/min): 20 Body Mass Index (BMI): 30.7 Blood Pressure (mmHg): 175/81 Reference Range: 80 - 120 mg / dl Electronic Signature(s) Signed: 10/09/2021 1:33:45 PM By: [name] RN, BSN Entered By: [name] on 10/09/2021 12:53:00

## 2021-10-09 NOTE — Progress Notes (Addendum)
FORREST, JAROSZEWSKI ([id number]) Visit Report for 10/09/2021 Chief Complaint Document Details Patient Name: Date of Service: Carlos White 10/09/2021 12:45 PM Medical Record Number: [id number] Patient Account Number: [id number] Date of Birth/Sex: Treating RN: [birth year] (81 y.o. [name] Primary Care Provider: [name] Other Clinician: Referring Provider: Treating Provider/Extender: [name] in Treatment: 210 Information Obtained from: Patient Chief Complaint Bilateral LE Ulcers Electronic Signature(s) Signed: 10/09/2021 12:54:40 PM By: Worthy Keeler PA-C Entered By: Worthy Keeler on 10/09/2021 12:54:40 -------------------------------------------------------------------------------- HPI Details Patient Name: Date of Service: Carlos White RDO [name] 10/09/2021 12:45 PM Medical Record Number: [id number] Patient Account Number: [id number] Date of Birth/Sex: Treating RN: Dec 16, 1940 (81 y.o. [name] Primary Care Provider: [name] Other Clinician: Referring Provider: Treating Provider/Extender: [name] in Treatment: 210 History of Present Illness HPI Description: 09/30/17 on evaluation today patient presents for initial evaluation and our clinic concerning issues that he has been having with his bilateral lower extremities. He states this has been going on for quite some time at least six months. Currently his regiment has been mainly cleaning the area with peroxide, applying the is foreign ointment, and wrapping the area with ABD pads and then an ace wrap loosely. He has dealt with issues of this nature he tells me for quite some time. He does have a history of having had a compound fracture of the left lower extremity which he thinks also makes this a much more difficult area for him to heal. He's previously been told that he had poor vascular flow but this was years ago at [facility] we do not have any of those  records at this time. He has a history of Epidermolysis Bullosa which was diagnosed around age 11 and he has been cared for at [facility] since that time. Subsequently he states this is hereditary and two of his children one male and one male also have this as well is one of his grandchildren that he is aware of. He has no evidence of infection necessarily at this point although he does have some necrotic tissue noted on the surface of the wound as far as the largest, left lateral lower extremity ulcer, is concerned. Overall I feel like all things considered he's been taking care of this very well. Obviously he has some fairly significant issues going on at this point in this regard. He does have a history otherwise of hypertension though for the most part other than the compound fracture of the left leg he seems to have been fairly healthy in my pinion. 10/07/17 on evaluation today patient actually appears to be doing better in regard to his bilateral lower extremity ulcers. With that being said he does still have some evidence of slough noted on the surface of the wounds I think the Iodoflex has been beneficial for him. His arterial studies are scheduled for October 2. With that being said I do believe that he is continuing to show signs of good improvement which is at least good news. 10/14/17 on evaluation today patient appears to be doing very well in regard to his lower extremity ulcers. He's definitely made some progress as far as healing is concerned although there still are several open areas that are going to need to be addressed. He did have his arterial study today which fortunately shows good findings with a right ABI of 1.23 with a TBI of 0.86 in the left ABI  of 1.28 with a TBI of 0.81. This is good news and will allow [place] to perform debridement as well. 10/23/2017; patient with a large wound on the left lateral calf, sizable area on the left medial malleolus and an area on the right lateral  malleolus. He has a new blister consistent with his underlying blistering skin disease just above this area we have been using Iodoflex on the lateral left calf lateral right ankle and collagen on the medial left ankle. We have been using Kerlix Coban wraps 10/28/17 on evaluation today the patient continues to have signs of improvement in regard to the overall appearance of the original wound. Unfortunately he did have some blistering over the right lateral lower extremity which has appeared to rupture on evaluation today and likely some of the dead tissue on the surface needs to be cleaned away the good news is this does not appear to be to significantly deep at this time. 11/04/17 on evaluation today patient actually appears to be doing a little better in regard to his lower extremity ulcers. He has been tolerating the dressing changes without complication. With that being said he does still have a significant wound especially over the left lateral lower extremity unfortunately. All of the wounds pretty much are going to require sharp debridement today. 11/11/17 on evaluation today patient appears to be doing more poorly in regard to his left lower extremity in particular. There does not appear to be any evidence of systemic infection although the wound itself as far as the larger left lateral lower extremity ulcer actually appears to be infected in my pinion. There's an older and the surface of the wound is dramatically worsened compared to last week. No fevers, chills, nausea, or vomiting noted at this time. 11/18/17 upon evaluation today patient actually appears to be doing better. I did review his culture today which really did not show any specific organism is a positive reason for his wound decline. There are multiple organisms present not predominant. Nonetheless he seems to be tolerate the doxycycline well in his wounds in general do seem to be doing better. Fortunately there does not appear  to be any evidence of infection at this time which is good news. Overall I'm very pleased with how things appear. Nonetheless he still has a lot of healing to go. I do think he could benefit from a Juxta-Lite wrap. 11/25/17 on evaluation today patient actually appears to be doing fairly well in regard to his wounds. He is still taking the antibiotics he has a few days left. Fortunately this seems to have been excellent for him as far as getting the infection control and very happy in this regard. With that being said the patient likewise is also very pleased with how things appear at this time in comparison to where we were he's not having as much pain. 12/02/17 Seen today for follow p and management of LLE wounds. Wounds appear to show some improvement. He denies pain, fever, or chills. Completed a course of doxycycline earlier this month. Scheduled to received Juxta-Lite wrap this week. No s/s of infections. 12/09/17 on evaluation today patient actually appears to be doing a little bit better in regard to his wounds. This is obscene very slow process and unfortunately he has a couple of new areas and this is due to the Epidermolysis Bullosa. Nonetheless I am concerned about the fact that he seems to be getting more areas not less that is the reason we're gonna work on  getting schedule for the vascular referral to see the venous specialist. 12/23/17 upon evaluation today patient's wounds currently shows evidence of still not doing quite as well is what I would like to have seen. Subsequently the patient did have his venous study which showed evidence of venous stasis. Subsequently I do think that a vascular evaluation for consideration of venous intervention would be appropriate. I'm not necessarily suggesting that will be anything that can be done but I think it is at least a good idea. He is in agreement with this plan. 12/30/17 on evaluation today patient actually appears to be doing very well in  regard to his wounds when compared to previous evaluation. Subsequently we have been using the [employer] Dressing which actually appears to have done excellent on his left lateral lower extremity ulcer. The quality of the wound surface is dramatically improved. There is some slight debridement that is going to be required at a couple of locations but overall I'm extremely pleased with how things appear here. 01/07/2018; this is a patient with a primary skin disorder epidermolyis bullosa. Is a large wound on the left lateral calf and smaller wounds on the right however there is a new wound on the right mid tibia area that occurred within the compression wrap that he did not change. We have been using Hydrofera Blue. On the left he is using Hydrofera Blue and Santyl to the inferior part of the wound and changing the dressing himself. 01/15/2018; primary skin disorder epidermolysis bullosa. He has several difficult wounds including the left lateral calf, smaller wounds on the left medial calf and the right lateral calf. The major area on the left lateral calf has a smaller area inferiorly that has necrotic debris we have been using Santyl to this. The rest of the wounds we have been using Hydrofera Blue. The area on the left calf actually looks larger this week. Uncontrolled edema several small open areas above it that are superficial 01/20/18 on evaluation today patient appears to be doing better as compared to last week in regard to his wounds of the bilateral lower extremities. He tolerated the bilateral compression wrap without complication. Overall I'm very pleased with how things appear at this time. The patient likewise is very happy. 01/27/18 on evaluation today patient appears to be doing decently well in regard to his bilateral lower extremity ulcers. He's been tolerating the dressing changes without complication. One issue he had is that he did have more drainage to the left leg wrapped last  week. He states in fact he probably should come in and let us change it on Friday however he just left it in place and kept adding extra absorption with ABD pads to the external portion of the wrap. Unfortunately he does have some aspiration type breakdown nothing significant but I do believe that this was probably counterproductive in general. Nonetheless his wounds do not appear to be terrible overall. 02/03/18 on evaluation today patient appears to be doing rather well in regard to his lower extremity ulcers. He has been tolerating the dressing changes without complication. He does tell me that he had to change the wrap on the left one since we last saw him. Subsequently I do not see any evidence of infection I do feel like the food was much better controlled at this point. 02/10/18 on evaluation today patient appears to be doing rather well in regard to his ulcers. He still has significant alterations especially on the left lateral lower extremity.  Fortunately there's no signs of infection at this time. Overall I feel like he is making good progress are some areas that I'm gonna attempt some debridement today. 02/17/18 on evaluation today patient appears to be doing okay in regard to his lower Trinity ulcer. It does appear on both locations he has a little bit of drainage causing some breakdown in maceration around the wound bed's although it doesn't appear to be too bad the right is a little bit worse than left. Fortunately there's no signs of infection which is good news. No fevers, chills, nausea, or vomiting noted at this time. 03/10/18 on evaluation today patient actually appears to be doing rather poorly in regard to his bilateral lower extremity ulcers. The right in particular is draining profusely and the wound is actually enlarging which is not good. I'm concerned about both possibly infection and the fact that there's a lot of moisture which is causing breakdown as well. Unfortunately the  patient has been trying to change this at home I'm afraid he may need to change more frequently in order to see the improvement that were looking for. There's no signs of systemic infection. 03/17/18 patient actually appears to be doing significantly better at this point in regard to his bilateral lower extremity ulcers. Fortunately there's no signs of infection. That is worsening infection at least indefinitely nothing systemic. With that being said he is having a lot of drainage though not quite as much is there in his last evaluation. Overall feel like he's on the side of improvement. I think if his results back from his culture which showed that he had a positive group B strep along with abundant Pseudomonas noted on the culture. For that reason I am gonna have him continue with the linezolid as we previously have ordered for him and I did go ahead as well today and prescribe Levaquin as well in order to treat the Pseudomonas portion of the infection noted. 03/24/18 on evaluation today patient actually appears to be doing very well in regard to his lower Trinity ulcer. He's been tolerating the dressing changes without complication. Fortunately both legs show signs of less drainage in his edema is very well controlled at this point as well. Overall very pleased with how things seem to be progressing. 03/31/18 on evaluation today patient actually appears to be doing excellent in regard to his bilateral lower extremity ulcers. These are not draining nearly as significant as what they were in the past overall seem to be shown signs of excellent improvement which is great news. Fortunately there is no sign of active infection at this time I do believe that the Levaquin has done extremely well for him in this regard. The patient continues to change these at home typically every day. We may be able to slowly work towards every other day changes since the drainage seems to be slowing down quite  significantly. 04/14/18 on evaluation today patient appears to be doing well in regard to his bilateral lower extremities. Let me Hilda Blades Almost completely healed which is excellent news. Fortunately he's shown signs of improvement all other sites as well with new skin growth there's some slight hyper granular tissue but for the most part this seems to be well maintained with the [facility] Dressing. I'm very happy in this regard. 04/28/18 on evaluation today patient appears to be doing rather well in regard to his ulcers of the bilateral lower extremities all things considering. He continues to make some progress as far  as new skin growth. There still some hyper granulation noted at this point despite the use of the [facility] Dressing. This is not terrible but I think we may want to consider conclude cauterization today with silver nitrate to try to help knock some of his back as well as helping with any biofilm on the surface of the wound. 05/12/18 on evaluation today patient's wounds actually appear to be doing fairly well in regard to the bilateral lower extremities. He's been tolerating the dressing changes without complication. Fortunately there's no signs of active infection at this time which is good news. Overall very pleased with how things seem to be progressing. You select silver nitrate was beneficial for him. 05/26/18 on evaluation today patient appears to be doing better in regard to left lower extremity and a little bit worse in regard to the right lower extremity. He states that he was pulling off the [facility] Dressing peel back some of the skin making this area significantly larger than what it was previous. He's not had any issues other than this and states even that hasn't caused any pain he just seems to obviously have a much larger area on the right when compared to the previous time I saw him. No fevers, chills, nausea, or vomiting noted at this time. 06/16/18 on  evaluation today patient actually appears to be doing a little better in my pinion in regard to his lower summary ulcers. He has new skin islands that seem to be spreading which is good news. Fortunately there's no signs of active infection at this time. His biggest issue is he tells me that coming as often as he does is becoming very cost prohibitive. He wonders if we can potentially spread this out. 07/14/18 on evaluation today patient appears to be doing a little bit worse in regard to his lower from the ulcer. Unfortunately he still continues to have a significant amount of drainage I think we need to do something to try to help this more. He is still somewhat reluctant to go the route of the Wound VAC although that may be the most appropriate thing for him. No fevers, chills, nausea, or vomiting noted at this time. 08/18/2018 on evaluation today patient actually appears to be doing quite well with regard to his bilateral lower extremity ulcers. I do feel like that currently he is making great progress the care max does seem to be doing a great job at helping to control the moisture he has no maceration or skin breakdown. Again this seems to be an excellent way to go. 1 thing we may want to change is adding collagen to the base of the wound and then the care max over top he is not opposed to this. 09/15/2018 on evaluation today patient appears to be doing well with regard to his bilateral lower extremity ulcers. He is showing some signs of improvement not necessarily in size but definitely in appearance. In fact he has a lot of new skin growing throughout the wounds along the edges as well as in the central portion of the wounds on both lower extremities. Overall I am extremely pleased to see this. 10/20/2018 on evaluation today patient actually appears to be doing quite well with regard to his wounds. They are not measuring significantly smaller but he does have a lot of new epithelization noted as  compared to previous. Fortunately there is no signs of active infection at this time. No fevers, chills, nausea, vomiting, or diarrhea. 11/17/2018  on evaluation today patient presents for reevaluation concerning his bilateral lower extremity ulcers. Fortunately there is no signs of active infection at this time today. He has been tolerating the dressing changes without complication. No fevers, chills, nausea, vomiting, or diarrhea. Unfortunately in general the patient has not made as much improvement as I would like to have seen up to this point. He has been tolerating the dressing changes without complication and he does an excellent job taking care of his wounds at home in my opinion. The biggest issue I see is that he is just not making the progress that we need to be seeing currently. I think we may want to consider having him seen at a plastic surgery appointment and he has previously seen someone in years past at [facility] in [place]. That is definitely a possibility for [place] to look into at this point. 12/29/2018 on evaluation today patient appears to be doing better in regard to the overall visual appearance of his wounds which do not appear to be as macerated. He does have a much larger skin island in the middle of the left lower extremity ulcer which is doing much better. He tells me the pain is also significantly better. With that being said overall his improvement as far as the size of the wounds is not better but again these are very irregular in change shape quite often. Fortunately there is no evidence of active infection at this time which is great news. He never heard anything from [facility] regarding the plastic surgery referral that we made to them. 01/26/2019 upon evaluation today patient appears to be doing a little better in regard to his wounds today. He has been tolerating the dressing changes again he performs these for the most part on his own. He does a great  job wrapping his legs in my opinion. Unfortunately he has not been able to get down to [facility] to see if there is anything from a plastic surgery standpoint that could be done to help with his legs simply due to the fact that his wife unfortunately sustained a compression fracture in her spine she is seeing Dr. Saintclair Halsted and subsequently is going to be having what sounds to be a kyphoplasty type procedure. With that being said that has not been scheduled yet there is still waiting on an MRI the patient is very busy in fact overly busy trying to help take care of his wife at this point. I completely understand this is more of a strain on him at this time 02/23/2019 upon evaluation today patient actually appears to be making some progress. I am actually very pleased with the overall appearance of his wounds even compared to last evaluation. He seems to be doing quite well. He is taking care of his wife unfortunately she did have a compression fracture she has had the procedure for this but still she has a slow road to recovery. For that reason he still not gone to [facility] for a second opinion in this regard. Obviously the goal there was if there was anything that can be done from a skin graft standpoint or otherwise. 03/23/2019 upon evaluation today patient continues to have issues with lower extremity ulcers. Since the beginning he has made progress but at the same time the wounds unfortunately just will not close. We have been trying to get the patient to [facility] to see a specialist there but unfortunately with the everything going on with his  wife he has not been able to make that appointment time yet he states he may be able to in the next 1-2 months but is not really sure. 04/27/2019 on evaluation today patient appears to be doing a little bit more poorly. His last evaluation. He appears to have some erythema around the edges of the wound at this point. Fortunately there is no  signs of active infection at this time which is good news. No fevers, chills, nausea, vomiting, or diarrhea. 06/08/2019 on evaluation today patient appears to be doing well with regard to his wounds. Overall they are actually measuring smaller compared to the last visit last month. We did treat him for Pseudomonas as well as methicillin-resistant Staph aureus. He was only on the treatment for MRSA however for 7 days as the Cipro was resistant and subsequently we had to place him on doxycycline. Nonetheless I am thinking that we may want to add the doxycycline and just do a month-long treatment considering the longstanding nature of his wounds and see if we get this under better control. 07/13/2019 upon evaluation today patient appears to be doing fairly well in regard to his bilateral lower extremities. There does not appear to be any signs of active infection which is good news. No fevers, chills, nausea, vomiting, or diarrhea. 08/10/2019 upon evaluation today patient appears to be doing about the same with regard to his wounds in general. Unfortunately he is not significantly better although is also not significantly worse which is great news there is no evidence of active infection at this time which is good news. He still dealing with a lot going on with his wife and therefore is not really able to go see anyone at the specialty clinic at [facility] that we have previously set up still. 09/07/2019 on evaluation today patient appears to be doing well with regard to his wounds. In fact this is probably the best that have seen so far in quite a few months. Overall I am very pleased with where things stand at this time. No fevers, chills, nausea, vomiting, or diarrhea. 10/05/2019 upon evaluation today patient appears to be doing more poorly in regard to his legs at this point. He actually is showing some signs of infection. This has been something that we seem to be back-and-forth with as far as trying to keep  these wounds from becoming infected. He takes care of them very well in my opinion but nonetheless I am concerned in this regard. He tells me that his wife is still doing really about the same she is slowly getting better. Nonetheless he still spends the majority of his time helping to take care of her. 11/02/2019 upon evaluation today patient actually appears to be doing somewhat better in regard to his legs. I do believe that the compounded antibiotic treatment from [facility] has been beneficial for him. Overall I am extremely pleased with where things stand today. There is no signs of active infection at this time. The patient states he has much less drainage than he has in the past. 12/07/2019 upon evaluation today patient appears to be doing well at this time in regard to the overall appearance of his wound bed. Currently there is no signs of active infection at this time. 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 Especially when  he stressed. Fortunately there is no signs of active infection locally or systemically at this point. 01/18/2020 on evaluation today patient appears to be doing well with regard to his leg ulcers. He has been tolerating the dressing changes without complication. Fortunately there is no signs of infection and overall I feel like his legs are doing about the best they have done in quite some time. There does not appear to be any evidence of active infection which is great news and overall very pleased. 02/29/2020 upon evaluation today patient's wounds actually appear to be doing quite well currently. There is no sign of active infection at this time. No fevers, chills, nausea, vomiting, or diarrhea. 04/11/2025 on evaluation today patient appears to be doing excellent in regard to his wounds on the legs to be  honest. He has made a lot of progress there does not appear to be any signs of active infection and overall I think that he is better than previous although still the wounds are quite significant obviously. In general I think that he is pleased with where things stand at this point. Still there is quite a bit of work to do here. 07/13/2020 upon evaluation today patient appears to be doing well with regard to his wound. Is been tolerating the dressing changes without complication on both legs. He just has the 2 main wounds on each leg at the ankle on the medial region of the right leg is completely healed. His wounds do seem to have gotten better during the time that he has been on bedrest as result of the pelvis fracture. Obviously does not the way that we wanted to see things improved but nonetheless he tells me that it is what it is. Fortunately he does seem to be doing better he tells me that her daughter from the [place] is actually down helping to take care of them out as he and his wife while he is recovering. 08/08/2020 upon evaluation today patient appears to be doing well currently in regard to his leg ulcers. He has been tolerating the dressing changes without complication. Fortunately there is no signs of active infection at this time. No fevers, chills, nausea, vomiting, or diarrhea. 09/26/2020 upon evaluation today patient appears to be doing well with regard to his wound. He has been showing signs of good improvement which is great news and overall I am extremely pleased with where things stand today. I do think that he does well with his dressing changes and overall has really improved significantly here. segment how that how she doing 11/14/2020 upon evaluation today patient appears to be doing well with regard to his leg ulcers. He has been tolerating the dressing changes without complication. Fortunately there does not appear to be any signs of active infection at this time. No fevers,  chills, nausea, vomiting, or diarrhea. 12/26/2020 upon evaluation today patient actually appears to be making some good progress here in regard to his wounds. Fortunately I do not see any signs of active infection which is great news. No fevers, chills, nausea, vomiting, or diarrhea. 02/06/2021 upon evaluation today patient appears to be doing well with regard to his wound currently. He has been tolerating the dressing changes without complication. Fortunately there does not appear to be any evidence of active infection locally nor systemically at this time. No fevers, chills, nausea, vomiting, or diarrhea. Overall I think that this is very slow but does seem to be getting a little bit better and a little smaller he  has been through a lot of stress recently with his wife she is having a heart evaluation tomorrow to see whether or not an aortic valve replacement is something that she could tolerate and undergo. 04/03/2020 upon evaluation today patient's legs are actually showing signs of significant improvement. This is actually a dramatic overall improvement since I last saw him I think we are getting very close to closing these wounds. That is an amazing thing is we been dealing with this for quite some time. The patient is very pleased to hear things appear to be doing well as well. 05-15-2021 upon evaluation today patient appears to be doing well with regard to his wound on the bilateral lower extremities. Both are showing signs of excellent improvement which is great news. There does not appear to be any signs of active infection locally nor systemically at this time. No fevers, chills, nausea, vomiting, or diarrhea. 06-26-2021 upon evaluation today patient appears to be doing well with regard to his wounds on the legs. They are actually showing signs of improvement in my opinion which is great news. Fortunately I do not see any evidence of active infection locally or systemically which is great news.  No fevers, chills, nausea, vomiting, or diarrhea. 08-21-2021 upon evaluation today patient appears to be making excellent progress. Has been using a mixture of topical antibiotic ointment followed by the ABD pads which does seem to be doing quite well. I do not see any evidence of active infection locally or systemically at this time which is great news. With that being said I think that he is very close to complete resolution. 10-09-2021 upon evaluation today patient appears to be doing excellent in regard to his wound. In fact both legs are actually looking well. He has a lot of new skin very few openings remaining at this point. Fortunately I do not see any signs of active infection locally or systemically at this time. Electronic Signature(s) Signed: 10/09/2021 1:51:22 PM By: Worthy Keeler PA-C Entered By: Worthy Keeler on 10/09/2021 13:51:22 -------------------------------------------------------------------------------- Physical Exam Details Patient Name: Date of Service: Carlos White 10/09/2021 12:45 PM Medical Record Number: [id number] Patient Account Number: [id number] Date of Birth/Sex: Treating RN: Aug 01, 1940 (81 y.o. [name] Primary Care Provider: [name] Other Clinician: Referring Provider: Treating Provider/Extender: [name] in Treatment: 81 Constitutional Well-nourished and well-hydrated in no acute distress. Respiratory normal breathing without difficulty. Psychiatric this patient is able to make decisions and demonstrates good insight into disease process. Alert and Oriented x 3. pleasant and cooperative. Notes Upon inspection patient's wounds again are showing signs of excellent epithelization and I feel like he is making great progress here. I do not see anything that appears to be significantly open currently. Electronic Signature(s) Signed: 10/09/2021 1:51:36 PM By: Worthy Keeler PA-C Entered By: Worthy Keeler on  10/09/2021 13:51:36 -------------------------------------------------------------------------------- Physician Orders Details Patient Name: Date of Service: Carlos White RDO [name] 10/09/2021 12:45 PM Medical Record Number: [id number] Patient Account Number: [id number] Date of Birth/Sex: Treating RN: 18-Jul-1940 (81 y.o. 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 Bathing/ Shower/ Hygiene May shower and wash wound with soap and water. - on days that dressing is changed Edema Control - Lymphedema / SCD / Other Bilateral Lower Extremities Elevate legs to the level of the heart or above for 30 minutes daily and/or when sitting, a frequency of: - throughout the day Avoid standing for long periods of time. Exercise regularly Moisturize legs daily. Compression stocking or Garment 20-30 mm/Hg pressure to: - Juxtalite to both legs daily Wound Treatment Wound #2 - Lower Leg Wound Laterality: Left, Lateral, Distal Cleanser: Soap and Water 3 x Per Week/30 Days Discharge Instructions: May shower and wash wound with dial antibacterial soap and water prior to dressing change. Peri-Wound Care: Zinc Oxide Ointment 30g tube 3 x Per Week/30  Days Discharge Instructions: Apply Zinc Oxide to periwound with each dressing change as needed for maceration Peri-Wound Care: Sween Lotion (Moisturizing lotion) 3 x Per Week/30 Days Discharge Instructions: Apply moisturizing lotion as directed Topical: neosporin 3 x Per Week/30 Days Discharge Instructions: apply three times a week. Secondary Dressing: ABD Pad, 5x9 (Generic) 3 x Per Week/30 Days Discharge Instructions: Apply over primary dressing as directed. Secured With: Elastic Bandage 4 inch (ACE bandage) 3 x Per Week/30 Days Discharge Instructions: Secure with ACE bandage as directed. Secured With: [employer], 4.5x3.1 (in/yd) (Generic) 3 x Per Week/30 Days Discharge Instructions: Secure with Kerlix as directed. Wound #5 - Lower Leg Wound Laterality: Right, Lateral, Anterior Cleanser: Soap and Water 3 x Per Week/30 Days Discharge Instructions: May shower and wash wound with dial antibacterial soap and water prior to dressing change. Peri-Wound Care: Zinc Oxide Ointment 30g tube 3 x Per Week/30 Days Discharge Instructions: Apply Zinc Oxide to periwound with each dressing change as needed for maceration Peri-Wound Care: Sween Lotion (Moisturizing lotion) 3 x Per Week/30 Days Discharge Instructions: Apply moisturizing lotion as directed Topical: neosporin 3 x Per Week/30 Days Discharge Instructions: apply three times a week. Secondary Dressing: ABD Pad, 5x9 (Generic) 3 x Per Week/30 Days Discharge Instructions: Apply over primary dressing as directed. Secured With: Elastic Bandage 4 inch (ACE bandage) 3 x Per Week/30 Days Discharge Instructions: Secure with ACE bandage as directed. Secured With: [employer], 4.5x3.1 (in/yd) (Generic) 3 x Per Week/30 Days Discharge Instructions: Secure with Kerlix as directed. Electronic Signature(s) Signed: 10/09/2021 1:33:45 PM By: [name] RN, BSN Signed: 10/09/2021 5:27:13 PM By: Worthy Keeler PA-C Entered By: [name] on  10/09/2021 13:04:59 -------------------------------------------------------------------------------- Problem List Details Patient Name: Date of Service: Carlos White RDO [name] 10/09/2021 12:45 PM Medical Record Number: [id number] Patient Account Number: [id number] Date of Birth/Sex: Treating RN: [birth year] (81 y.o. 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 09/30/2017 No Yes L97.322 Non-pressure chronic ulcer of left ankle with fat layer exposed 09/30/2017 No Yes L97.822 Non-pressure chronic ulcer of other part of left lower leg with fat layer exposed9/18/2019 No Yes L97.812 Non-pressure chronic ulcer of other part of right lower leg with fat layer 09/30/2017 No Yes  exposed [place] (primary) hypertension 09/30/2017 No Yes Inactive Problems Resolved Problems Electronic Signature(s) Signed: 10/09/2021 12:54:33 PM By: Worthy Keeler PA-C Entered By: Worthy Keeler on 10/09/2021 12:54:33 -------------------------------------------------------------------------------- Progress Note Details Patient Name: Date of Service: Carlos White RDO [name] 10/09/2021 12:45 PM Medical Record Number: [id number] Patient Account Number: [id number] Date of Birth/Sex: Treating RN: [birth year] (81 y.o. [name] Primary Care Provider: [name] Other Clinician: Referring Provider: Treating Provider/Extender: [name] in Treatment: 210 Subjective Chief Complaint Information obtained from Patient Bilateral LE Ulcers History of Present Illness (HPI) 09/30/17 on evaluation today patient presents for initial evaluation and our clinic concerning issues that he has been having  with his bilateral lower extremities. He states this has been going on for quite some time at least six months. Currently his regiment has been mainly cleaning the area with peroxide, applying the is foreign ointment, and wrapping the area with ABD pads and then an ace wrap loosely. He has dealt with issues of this nature he tells me for quite some time. He does have a history of having had a compound fracture of the left lower extremity which he thinks also makes this a much more difficult area for him to heal. He's previously been told that he had poor vascular flow but this was years ago at [facility] we do not have any of those records at this time. He has a history of Epidermolysis Bullosa which was diagnosed around age 67 and he has been cared for at [facility] since that time. Subsequently he states this is hereditary and two of his children one male and one male also have this as well is one of his grandchildren that he is aware of. He has no evidence of infection necessarily at this point although he does have some necrotic tissue noted on the surface of the wound as far as the largest, left lateral lower extremity ulcer, is concerned. Overall I feel like all things considered he's been taking care of this very well. Obviously he has some fairly significant issues going on at this point in this regard. He does have a history otherwise of hypertension though for the most part other than the compound fracture of the left leg he seems to have been fairly healthy in my pinion. 10/07/17 on evaluation today patient actually appears to be doing better in regard to his bilateral lower extremity ulcers. With that being said he does still have some evidence of slough noted on the surface of the wounds I think the Iodoflex has been beneficial for him. His arterial studies are scheduled for October 2. With that being said I do believe that he is continuing to show signs of good improvement which is at least good  news. 10/14/17 on evaluation today patient appears to be doing very well in regard to his lower extremity ulcers. He's definitely made some progress as far as healing is concerned although there still are several open areas that are going to need to be addressed. He did have his arterial study today which fortunately shows good findings with a right ABI of 1.23 with a TBI of 0.86 in the left ABI of 1.28 with a TBI of 0.81. This is good news and will allow [place] to perform debridement as well. 10/23/2017; patient with a large wound on the left lateral calf, sizable area on the left medial malleolus and an area on the right lateral malleolus. He has a new blister  consistent with his underlying blistering skin disease just above this area we have been using Iodoflex on the lateral left calf lateral right ankle and collagen on the medial left ankle. We have been using Kerlix Coban wraps 10/28/17 on evaluation today the patient continues to have signs of improvement in regard to the overall appearance of the original wound. Unfortunately he did have some blistering over the right lateral lower extremity which has appeared to rupture on evaluation today and likely some of the dead tissue on the surface needs to be cleaned away the good news is this does not appear to be to significantly deep at this time. 11/04/17 on evaluation today patient actually appears to be doing a little better in regard to his lower extremity ulcers. He has been tolerating the dressing changes without complication. With that being said he does still have a significant wound especially over the left lateral lower extremity unfortunately. All of the wounds pretty much are going to require sharp debridement today. 11/11/17 on evaluation today patient appears to be doing more poorly in regard to his left lower extremity in particular. There does not appear to be any evidence of systemic infection although the wound itself as far as the  larger left lateral lower extremity ulcer actually appears to be infected in my pinion. There's an older and the surface of the wound is dramatically worsened compared to last week. No fevers, chills, nausea, or vomiting noted at this time. 11/18/17 upon evaluation today patient actually appears to be doing better. I did review his culture today which really did not show any specific organism is a positive reason for his wound decline. There are multiple organisms present not predominant. Nonetheless he seems to be tolerate the doxycycline well in his wounds in general do seem to be doing better. Fortunately there does not appear to be any evidence of infection at this time which is good news. Overall I'm very pleased with how things appear. Nonetheless he still has a lot of healing to go. I do think he could benefit from a Juxta-Lite wrap. 11/25/17 on evaluation today patient actually appears to be doing fairly well in regard to his wounds. He is still taking the antibiotics he has a few days left. Fortunately this seems to have been excellent for him as far as getting the infection control and very happy in this regard. With that being said the patient likewise is also very pleased with how things appear at this time in comparison to where we were he's not having as much pain. 12/02/17 Seen today for follow p and management of LLE wounds. Wounds appear to show some improvement. He denies pain, fever, or chills. Completed a course of doxycycline earlier this month. Scheduled to received Juxta-Lite wrap this week. No s/s of infections. 12/09/17 on evaluation today patient actually appears to be doing a little bit better in regard to his wounds. This is obscene very slow process and unfortunately he has a couple of new areas and this is due to the Epidermolysis Bullosa. Nonetheless I am concerned about the fact that he seems to be getting more areas not less that is the reason we're gonna work on getting  schedule for the vascular referral to see the venous specialist. 12/23/17 upon evaluation today patient's wounds currently shows evidence of still not doing quite as well is what I would like to have seen. Subsequently the patient did have his venous study which showed evidence of venous stasis. Subsequently I  do think that a vascular evaluation for consideration of venous intervention would be appropriate. I'm not necessarily suggesting that will be anything that can be done but I think it is at least a good idea. He is in agreement with this plan. 12/30/17 on evaluation today patient actually appears to be doing very well in regard to his wounds when compared to previous evaluation. Subsequently we have been using the [facility] Dressing which actually appears to have done excellent on his left lateral lower extremity ulcer. The quality of the wound surface is dramatically improved. There is some slight debridement that is going to be required at a couple of locations but overall I'm extremely pleased with how things appear here. 01/07/2018; this is a patient with a primary skin disorder epidermolyis bullosa. Is a large wound on the left lateral calf and smaller wounds on the right however there is a new wound on the right mid tibia area that occurred within the compression wrap that he did not change. We have been using Hydrofera Blue. On the left he is using Hydrofera Blue and Santyl to the inferior part of the wound and changing the dressing himself. 01/15/2018; primary skin disorder epidermolysis bullosa. He has several difficult wounds including the left lateral calf, smaller wounds on the left medial calf and the right lateral calf. The major area on the left lateral calf has a smaller area inferiorly that has necrotic debris we have been using Santyl to this. The rest of the wounds we have been using Hydrofera Blue. The area on the left calf actually looks larger this week. Uncontrolled  edema several small open areas above it that are superficial 01/20/18 on evaluation today patient appears to be doing better as compared to last week in regard to his wounds of the bilateral lower extremities. He tolerated the bilateral compression wrap without complication. Overall I'm very pleased with how things appear at this time. The patient likewise is very happy. 01/27/18 on evaluation today patient appears to be doing decently well in regard to his bilateral lower extremity ulcers. He's been tolerating the dressing changes without complication. One issue he had is that he did have more drainage to the left leg wrapped last week. He states in fact he probably should come in and let us change it on Friday however he just left it in place and kept adding extra absorption with ABD pads to the external portion of the wrap. Unfortunately he does have some aspiration type breakdown nothing significant but I do believe that this was probably counterproductive in general. Nonetheless his wounds do not appear to be terrible overall. 02/03/18 on evaluation today patient appears to be doing rather well in regard to his lower extremity ulcers. He has been tolerating the dressing changes without complication. He does tell me that he had to change the wrap on the left one since we last saw him. Subsequently I do not see any evidence of infection I do feel like the food was much better controlled at this point. 02/10/18 on evaluation today patient appears to be doing rather well in regard to his ulcers. He still has significant alterations especially on the left lateral lower extremity. Fortunately there's no signs of infection at this time. Overall I feel like he is making good progress are some areas that I'm gonna attempt some debridement today. 02/17/18 on evaluation today patient appears to be doing okay in regard to his lower [place] ulcer. It does appear on both locations he  has a little bit of  drainage causing some breakdown in maceration around the wound bed's although it doesn't appear to be too bad the right is a little bit worse than left. Fortunately there's no signs of infection which is good news. No fevers, chills, nausea, or vomiting noted at this time. 03/10/18 on evaluation today patient actually appears to be doing rather poorly in regard to his bilateral lower extremity ulcers. The right in particular is draining profusely and the wound is actually enlarging which is not good. I'm concerned about both possibly infection and the fact that there's a lot of moisture which is causing breakdown as well. Unfortunately the patient has been trying to change this at home I'm afraid he may need to change more frequently in order to see the improvement that were looking for. There's no signs of systemic infection. 03/17/18 patient actually appears to be doing significantly better at this point in regard to his bilateral lower extremity ulcers. Fortunately there's no signs of infection. That is worsening infection at least indefinitely nothing systemic. With that being said he is having a lot of drainage though not quite as much is there in his last evaluation. Overall feel like he's on the side of improvement. I think if his results back from his culture which showed that he had a positive group B strep along with abundant Pseudomonas noted on the culture. For that reason I am gonna have him continue with the linezolid as we previously have ordered for him and I did go ahead as well today and prescribe Levaquin as well in order to treat the Pseudomonas portion of the infection noted. 03/24/18 on evaluation today patient actually appears to be doing very well in regard to his lower [place] ulcer. He's been tolerating the dressing changes without complication. Fortunately both legs show signs of less drainage in his edema is very well controlled at this point as well. Overall very pleased with  how things seem to be progressing. 03/31/18 on evaluation today patient actually appears to be doing excellent in regard to his bilateral lower extremity ulcers. These are not draining nearly as significant as what they were in the past overall seem to be shown signs of excellent improvement which is great news. Fortunately there is no sign of active infection at this time I do believe that the Levaquin has done extremely well for him in this regard. The patient continues to change these at home typically every day. We may be able to slowly work towards every other day changes since the drainage seems to be slowing down quite significantly. 04/14/18 on evaluation today patient appears to be doing well in regard to his bilateral lower extremities. Let me [facility] Almost completely healed which is excellent news. Fortunately he's shown signs of improvement all other sites as well with new skin growth there's some slight hyper granular tissue but for the most part this seems to be well maintained with the [employer] Dressing. I'm very happy in this regard. 04/28/18 on evaluation today patient appears to be doing rather well in regard to his ulcers of the bilateral lower extremities all things considering. He continues to make some progress as far as new skin growth. There still some hyper granulation noted at this point despite the use of the [facility] Dressing. This is not terrible but I think we may want to consider conclude cauterization today with silver nitrate to try to help knock some of his back as well as helping  with any biofilm on the surface of the wound. 05/12/18 on evaluation today patient's wounds actually appear to be doing fairly well in regard to the bilateral lower extremities. He's been tolerating the dressing changes without complication. Fortunately there's no signs of active infection at this time which is good news. Overall very pleased with how things seem to  be progressing. You select silver nitrate was beneficial for him. 05/26/18 on evaluation today patient appears to be doing better in regard to left lower extremity and a little bit worse in regard to the right lower extremity. He states that he was pulling off the [facility] Dressing peel back some of the skin making this area significantly larger than what it was previous. He's not had any issues other than this and states even that hasn't caused any pain he just seems to obviously have a much larger area on the right when compared to the previous time I saw him. No fevers, chills, nausea, or vomiting noted at this time. 06/16/18 on evaluation today patient actually appears to be doing a little better in my pinion in regard to his lower summary ulcers. He has new skin islands that seem to be spreading which is good news. Fortunately there's no signs of active infection at this time. His biggest issue is he tells me that coming as often as he does is becoming very cost prohibitive. He wonders if we can potentially spread this out. 07/14/18 on evaluation today patient appears to be doing a little bit worse in regard to his lower from the ulcer. Unfortunately he still continues to have a significant amount of drainage I think we need to do something to try to help this more. He is still somewhat reluctant to go the route of the Wound VAC although that may be the most appropriate thing for him. No fevers, chills, nausea, or vomiting noted at this time. 08/18/2018 on evaluation today patient actually appears to be doing quite well with regard to his bilateral lower extremity ulcers. I do feel like that currently he is making great progress the care max does seem to be doing a great job at helping to control the moisture he has no maceration or skin breakdown. Again this seems to be an excellent way to go. 1 thing we may want to change is adding collagen to the base of the wound and then the care max over top  he is not opposed to this. 09/15/2018 on evaluation today patient appears to be doing well with regard to his bilateral lower extremity ulcers. He is showing some signs of improvement not necessarily in size but definitely in appearance. In fact he has a lot of new skin growing throughout the wounds along the edges as well as in the central portion of the wounds on both lower extremities. Overall I am extremely pleased to see this. 10/20/2018 on evaluation today patient actually appears to be doing quite well with regard to his wounds. They are not measuring significantly smaller but he does have a lot of new epithelization noted as compared to previous. Fortunately there is no signs of active infection at this time. No fevers, chills, nausea, vomiting, or diarrhea. 11/17/2018 on evaluation today patient presents for reevaluation concerning his bilateral lower extremity ulcers. Fortunately there is no signs of active infection at this time today. He has been tolerating the dressing changes without complication. No fevers, chills, nausea, vomiting, or diarrhea. Unfortunately in general the patient has not made as much improvement  as I would like to have seen up to this point. He has been tolerating the dressing changes without complication and he does an excellent job taking care of his wounds at home in my opinion. The biggest issue I see is that he is just not making the progress that we need to be seeing currently. I think we may want to consider having him seen at a plastic surgery appointment and he has previously seen someone in years past at [facility] in [place]. That is definitely a possibility for [place] to look into at this point. 12/29/2018 on evaluation today patient appears to be doing better in regard to the overall visual appearance of his wounds which do not appear to be as macerated. He does have a much larger skin island in the middle of the left lower extremity ulcer  which is doing much better. He tells me the pain is also significantly better. With that being said overall his improvement as far as the size of the wounds is not better but again these are very irregular in change shape quite often. Fortunately there is no evidence of active infection at this time which is great news. He never heard anything from [facility] regarding the plastic surgery referral that we made to them. 01/26/2019 upon evaluation today patient appears to be doing a little better in regard to his wounds today. He has been tolerating the dressing changes again he performs these for the most part on his own. He does a great job wrapping his legs in my opinion. Unfortunately he has not been able to get down to [facility] to see if there is anything from a plastic surgery standpoint that could be done to help with his legs simply due to the fact that his wife unfortunately sustained a compression fracture in her spine she is seeing Dr. Saintclair Halsted and subsequently is going to be having what sounds to be a kyphoplasty type procedure. With that being said that has not been scheduled yet there is still waiting on an MRI the patient is very busy in fact overly busy trying to help take care of his wife at this point. I completely understand this is more of a strain on him at this time 02/23/2019 upon evaluation today patient actually appears to be making some progress. I am actually very pleased with the overall appearance of his wounds even compared to last evaluation. He seems to be doing quite well. He is taking care of his wife unfortunately she did have a compression fracture she has had the procedure for this but still she has a slow road to recovery. For that reason he still not gone to [facility] for a second opinion in this regard. Obviously the goal there was if there was anything that can be done from a skin graft standpoint or otherwise. 03/23/2019 upon evaluation today patient continues to have issues  with lower extremity ulcers. Since the beginning he has made progress but at the same time the wounds unfortunately just will not close. We have been trying to get the patient to [facility] to see a specialist there but unfortunately with the everything going on with his wife he has not been able to make that appointment time yet he states he may be able to in the next 1-2 months but is not really sure. 04/27/2019 on evaluation today patient appears to be doing a little bit more poorly. His last evaluation. He appears to have some  erythema around the edges of the wound at this point. Fortunately there is no signs of active infection at this time which is good news. No fevers, chills, nausea, vomiting, or diarrhea. 06/08/2019 on evaluation today patient appears to be doing well with regard to his wounds. Overall they are actually measuring smaller compared to the last visit last month. We did treat him for Pseudomonas as well as methicillin-resistant Staph aureus. He was only on the treatment for MRSA however for 7 days as the Cipro was resistant and subsequently we had to place him on doxycycline. Nonetheless I am thinking that we may want to add the doxycycline and just do a month-long treatment considering the longstanding nature of his wounds and see if we get this under better control. 07/13/2019 upon evaluation today patient appears to be doing fairly well in regard to his bilateral lower extremities. There does not appear to be any signs of active infection which is good news. No fevers, chills, nausea, vomiting, or diarrhea. 08/10/2019 upon evaluation today patient appears to be doing about the same with regard to his wounds in general. Unfortunately he is not significantly better although is also not significantly worse which is great news there is no evidence of active infection at this time which is good news. He still dealing with a lot going on with his wife and  therefore is not really able to go see anyone at the specialty clinic at [facility] that we have previously set up still. 09/07/2019 on evaluation today patient appears to be doing well with regard to his wounds. In fact this is probably the best that have seen so far in quite a few months. Overall I am very pleased with where things stand at this time. No fevers, chills, nausea, vomiting, or diarrhea. 10/05/2019 upon evaluation today patient appears to be doing more poorly in regard to his legs at this point. He actually is showing some signs of infection. This has been something that we seem to be back-and-forth with as far as trying to keep these wounds from becoming infected. He takes care of them very well in my opinion but nonetheless I am concerned in this regard. He tells me that his wife is still doing really about the same she is slowly getting better. Nonetheless he still spends the majority of his time helping to take care of her. 11/02/2019 upon evaluation today patient actually appears to be doing somewhat better in regard to his legs. I do believe that the compounded antibiotic treatment from [facility] has been beneficial for him. Overall I am extremely pleased with where things stand today. There is no signs of active infection at this time. The patient states he has much less drainage than he has in the past. 12/07/2019 upon evaluation today patient appears to be doing well at this time in regard to the overall appearance of his wound bed. Currently there is no signs of active infection at this time. 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 Especially when he stressed. Fortunately there is no signs of active infection locally or systemically at this point. 01/18/2020 on evaluation today patient appears to be doing well with  regard to his leg ulcers. He has been tolerating the dressing changes without complication. Fortunately there is no signs of infection and overall I  feel like his legs are doing about the best they have done in quite some time. There does not appear to be any evidence of active infection which is great news and overall very pleased. 02/29/2020 upon evaluation today patient's wounds actually appear to be doing quite well currently. There is no sign of active infection at this time. No fevers, chills, nausea, vomiting, or diarrhea. 04/11/2025 on evaluation today patient appears to be doing excellent in regard to his wounds on the legs to be honest. He has made a lot of progress there does not appear to be any signs of active infection and overall I think that he is better than previous although still the wounds are quite significant obviously. In general I think that he is pleased with where things stand at this point. Still there is quite a bit of work to do here. 07/13/2020 upon evaluation today patient appears to be doing well with regard to his wound. Is been tolerating the dressing changes without complication on both legs. He just has the 2 main wounds on each leg at the ankle on the medial region of the right leg is completely healed. His wounds do seem to have gotten better during the time that he has been on bedrest as result of the pelvis fracture. Obviously does not the way that we wanted to see things improved but nonetheless he tells me that it is what it is. Fortunately he does seem to be doing better he tells me that her daughter from the [place] is actually down helping to take care of them out as he and his wife while he is recovering. 08/08/2020 upon evaluation today patient appears to be doing well currently in regard to his leg ulcers. He has been tolerating the dressing changes without complication. Fortunately there is no signs of active infection at this time. No fevers, chills, nausea,  vomiting, or diarrhea. 09/26/2020 upon evaluation today patient appears to be doing well with regard to his wound. He has been showing signs of good improvement which is great news and overall I am extremely pleased with where things stand today. I do think that he does well with his dressing changes and overall has really improved significantly here. segment how that how she doing 11/14/2020 upon evaluation today patient appears to be doing well with regard to his leg ulcers. He has been tolerating the dressing changes without complication. Fortunately there does not appear to be any signs of active infection at this time. No fevers, chills, nausea, vomiting, or diarrhea. 12/26/2020 upon evaluation today patient actually appears to be making some good progress here in regard to his wounds. Fortunately I do not see any signs of active infection which is great news. No fevers, chills, nausea, vomiting, or diarrhea. 02/06/2021 upon evaluation today patient appears to be doing well with regard to his wound currently. He has been tolerating the dressing changes without complication. Fortunately there does not appear to be any evidence of active infection locally nor systemically at this time. No fevers, chills, nausea, vomiting, or diarrhea. Overall I think that this is very slow but does seem to be getting a little bit better and a little smaller he has been through a lot of stress recently with his wife she is having a heart evaluation tomorrow to see whether or not an aortic valve replacement is something that she could tolerate and undergo. 04/03/2020 upon evaluation today patient's legs are actually showing signs of significant improvement. This is actually a  dramatic overall improvement since I last saw him I think we are getting very close to closing these wounds. That is an amazing thing is we been dealing with this for quite some time. The patient is very pleased to hear things appear to be doing  well as well. 05-15-2021 upon evaluation today patient appears to be doing well with regard to his wound on the bilateral lower extremities. Both are showing signs of excellent improvement which is great news. There does not appear to be any signs of active infection locally nor systemically at this time. No fevers, chills, nausea, vomiting, or diarrhea. 06-26-2021 upon evaluation today patient appears to be doing well with regard to his wounds on the legs. They are actually showing signs of improvement in my opinion which is great news. Fortunately I do not see any evidence of active infection locally or systemically which is great news. No fevers, chills, nausea, vomiting, or diarrhea. 08-21-2021 upon evaluation today patient appears to be making excellent progress. Has been using a mixture of topical antibiotic ointment followed by the ABD pads which does seem to be doing quite well. I do not see any evidence of active infection locally or systemically at this time which is great news. With that being said I think that he is very close to complete resolution. 10-09-2021 upon evaluation today patient appears to be doing excellent in regard to his wound. In fact both legs are actually looking well. He has a lot of new skin very few openings remaining at this point. Fortunately I do not see any signs of active infection locally or systemically at this time. Objective Constitutional Well-nourished and well-hydrated in no acute distress. Vitals Time Taken: 12:50 PM, Height: 71 in, Weight: 220 lbs, BMI: 30.7, Temperature: 98.2 F, Pulse: 71 bpm, Respiratory Rate: 20 breaths/min, Blood Pressure: 175/81 mmHg. Respiratory normal breathing without difficulty. Psychiatric this patient is able to make decisions and demonstrates good insight into disease process. Alert and Oriented x 3. pleasant and cooperative. General Notes: Upon inspection patient's wounds again are showing signs of excellent epithelization  and I feel like he is making great progress here. I do not see anything that appears to be significantly open currently. Integumentary (Hair, Skin) Wound #2 status is Open. Original cause of wound was Gradually Appeared. The date acquired was: 03/13/2017. The wound has been in treatment 210 weeks. The wound is located on the Left,Distal,Lateral Lower Leg. The wound measures 11cm length x 8cm width x 0.2cm depth; 69.115cm^2 area and 13.823cm^3 volume. There is Fat Layer (Subcutaneous Tissue) exposed. There is no tunneling or undermining noted. There is a medium amount of serosanguineous drainage noted. The wound margin is distinct with the outline attached to the wound base. There is large (67-100%) red granulation within the wound bed. There is a small (1-33%) amount of necrotic tissue within the wound bed including Adherent Slough. Wound #5 status is Open. Original cause of wound was Gradually Appeared. The date acquired was: 10/26/2017. The wound has been in treatment 206 weeks. The wound is located on the Right,Lateral,Anterior Lower Leg. The wound measures 7cm length x 11cm width x 0.1cm depth; 60.476cm^2 area and 6.048cm^3 volume. There is Fat Layer (Subcutaneous Tissue) exposed. There is no tunneling or undermining noted. There is a medium amount of serosanguineous drainage noted. The wound margin is distinct with the outline attached to the wound base. There is large (67-100%) red granulation within the wound bed. There is no necrotic tissue within the wound bed.  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 Bathing/ Shower/ Hygiene: May shower and wash wound with soap and water. - on days that dressing is changed Edema Control - Lymphedema / SCD / Other: Elevate legs to the level of the heart or above for 30 minutes daily and/or when sitting, a frequency of: - throughout the day Avoid standing for long periods of time. Exercise regularly Moisturize legs daily. Compression stocking or Garment 20-30 mm/Hg pressure to: - Juxtalite to both legs daily WOUND #2: - Lower Leg Wound Laterality: Left, Lateral, Distal Cleanser: Soap and Water 3 x Per Week/30 Days Discharge Instructions: May shower and wash wound with dial antibacterial soap and water prior to dressing change. Peri-Wound Care: Zinc Oxide Ointment 30g tube 3 x Per Week/30 Days Discharge Instructions: Apply Zinc Oxide to periwound with each dressing change as needed for maceration Peri-Wound Care: Sween Lotion (Moisturizing lotion) 3 x Per Week/30 Days Discharge Instructions: Apply moisturizing lotion as directed Topical: neosporin 3 x Per Week/30 Days Discharge Instructions: apply three times a week. Secondary Dressing: ABD Pad, 5x9 (Generic) 3 x Per Week/30 Days Discharge Instructions: Apply over primary dressing as directed. Secured With: Elastic Bandage 4 inch (ACE bandage) 3 x Per Week/30 Days Discharge Instructions: Secure with ACE bandage as directed. Secured With: [employer], 4.5x3.1 (in/yd) (Generic) 3 x Per Week/30 Days Discharge Instructions: Secure with Kerlix as directed. WOUND #5: - Lower Leg Wound Laterality: Right, Lateral, Anterior Cleanser: Soap and Water 3 x Per Week/30 Days Discharge Instructions: May shower and wash wound with dial antibacterial soap and water prior to dressing change. Peri-Wound Care: Zinc Oxide Ointment 30g tube 3 x Per Week/30 Days Discharge Instructions: Apply  Zinc Oxide to periwound with each dressing change as needed for maceration Peri-Wound Care: Sween Lotion (Moisturizing lotion) 3 x Per Week/30 Days Discharge Instructions: Apply moisturizing lotion as directed Topical: neosporin 3 x Per Week/30 Days Discharge Instructions: apply three times a week. Secondary Dressing: ABD Pad, 5x9 (Generic) 3 x Per Week/30 Days Discharge Instructions: Apply over primary dressing as directed. Secured With: Elastic Bandage 4 inch (ACE bandage) 3 x Per Week/30 Days Discharge Instructions: Secure with ACE bandage as directed. Secured With: [employer], 4.5x3.1 (in/yd) (Generic) 3 x Per Week/30 Days Discharge Instructions: Secure with Kerlix as directed. 1. I would recommend that we go ahead and continue with the recommendation for wound care measures as before and he is in agreement with plan. He is using zinc cover some of the good skin for protection and then using the Neosporin which tends to do well for him over top of any of the open areas. Typically this helps things heal quite nicely. 2. I would recommend as well that we have the patient continue with the ABD pads to cover followed by the Ace wrap to secure in place he is using the juxta light as well. We will see him back in about 2 months for reevaluation as before. He will let me know if anything changes. Electronic Signature(s) Signed: 10/09/2021 1:52:36 PM By: Melburn Hake,  Margarita Grizzle PA-C Entered By: Worthy Keeler on 10/09/2021 13:52:36 -------------------------------------------------------------------------------- SuperBill Details Patient Name: Date of Service: Carlos White 10/09/2021 Medical Record Number: [id number] Patient Account Number: [id number] Date of Birth/Sex: Treating RN: 1940/12/02 (81 y.o. [name] Primary Care Provider: [name] Other Clinician: Referring Provider: Treating Provider/Extender: [name] in Treatment: 210 Diagnosis  Coding ICD-10 Codes Code Description I87.2 Venous insufficiency (chronic) (peripheral) Q81.8 Other epidermolysis bullosa L97.322 Non-pressure chronic ulcer of left ankle with fat layer exposed L97.822 Non-pressure chronic ulcer of other part of left lower leg with fat layer exposed L97.812 Non-pressure chronic ulcer of other part of right lower leg with fat layer exposed Bulger (primary) hypertension Facility Procedures CPT4 Code: 51102111 [id number] Description: 9214 - WOUND CARE VISIT-LEV 4 EST PT Modifier: Quantity: 1 Physician Procedures : CPT4 Code Description Modifier [id number] - WC PHYS LEVEL 3 - EST PT ICD-10 Diagnosis Description I87.2 Venous insufficiency (chronic) (peripheral) 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

## 2021-10-12 NOTE — Progress Notes (Signed)
IElby Showers, MD, have reviewed all documentation for this visit. The documentation on 10/12/21 for the exam, diagnosis, procedures, and orders are all accurate and complete.

## 2021-10-12 NOTE — Patient Instructions (Addendum)
Cologuard will be ordered for colon cancer screening.  Pneumococcal 20 vaccine given.  Coronary calcium scoring ordered at [facility].  Please call [phone] to schedule this test.  Recommend multivitamin with iron for mild anemia which is longstanding.  Cologuard test will be ordered.  You will receive this in the mail.  Return in 1 year or as needed and continue current medications.

## 2021-10-14 ENCOUNTER — Other Ambulatory Visit: Payer: Self-pay

## 2021-10-14 DIAGNOSIS — D649 Anemia, unspecified: Secondary | ICD-10-CM

## 2021-10-14 DIAGNOSIS — Z1211 Encounter for screening for malignant neoplasm of colon: Secondary | ICD-10-CM

## 2021-10-15 NOTE — Addendum Note (Signed)
Addended by: Geradine Girt D on: 10/15/2021 12:52 PM   Modules accepted: Orders

## 2021-10-16 ENCOUNTER — Other Ambulatory Visit: Payer: Self-pay

## 2021-10-16 DIAGNOSIS — Z1211 Encounter for screening for malignant neoplasm of colon: Secondary | ICD-10-CM

## 2021-10-16 NOTE — Addendum Note (Signed)
Addended by: Geradine Girt D on: 10/16/2021 10:21 AM   Modules accepted: Orders

## 2021-10-23 LAB — COLOGUARD

## 2021-10-25 DIAGNOSIS — L928 Other granulomatous disorders of the skin and subcutaneous tissue: Secondary | ICD-10-CM | POA: Diagnosis not present

## 2021-10-25 DIAGNOSIS — Z85828 Personal history of other malignant neoplasm of skin: Secondary | ICD-10-CM | POA: Diagnosis not present

## 2021-10-25 DIAGNOSIS — L570 Actinic keratosis: Secondary | ICD-10-CM | POA: Diagnosis not present

## 2021-10-25 DIAGNOSIS — X32XXXD Exposure to sunlight, subsequent encounter: Secondary | ICD-10-CM | POA: Diagnosis not present

## 2021-10-25 DIAGNOSIS — Z08 Encounter for follow-up examination after completed treatment for malignant neoplasm: Secondary | ICD-10-CM | POA: Diagnosis not present

## 2021-11-21 ENCOUNTER — Ambulatory Visit (HOSPITAL_BASED_OUTPATIENT_CLINIC_OR_DEPARTMENT_OTHER)
Admission: RE | Admit: 2021-11-21 | Discharge: 2021-11-21 | Disposition: A | Discharge status: Home / Self Care | Payer: Medicare PPO | Source: Ambulatory Visit | Attending: Internal Medicine | Admitting: Internal Medicine

## 2021-11-21 DIAGNOSIS — Z8249 Family history of ischemic heart disease and other diseases of the circulatory system: Secondary | ICD-10-CM | POA: Insufficient documentation

## 2021-11-25 NOTE — Progress Notes (Signed)
scheduled

## 2021-11-27 ENCOUNTER — Encounter (HOSPITAL_BASED_OUTPATIENT_CLINIC_OR_DEPARTMENT_OTHER): Payer: Medicare PPO | Attending: Physician Assistant | Admitting: Physician Assistant

## 2021-11-27 DIAGNOSIS — I10 Essential (primary) hypertension: Secondary | ICD-10-CM | POA: Insufficient documentation

## 2021-11-27 DIAGNOSIS — L97812 Non-pressure chronic ulcer of other part of right lower leg with fat layer exposed: Secondary | ICD-10-CM | POA: Insufficient documentation

## 2021-11-27 DIAGNOSIS — L97322 Non-pressure chronic ulcer of left ankle with fat layer exposed: Secondary | ICD-10-CM | POA: Insufficient documentation

## 2021-11-27 DIAGNOSIS — L97822 Non-pressure chronic ulcer of other part of left lower leg with fat layer exposed: Secondary | ICD-10-CM | POA: Insufficient documentation

## 2021-11-27 DIAGNOSIS — Q819 Epidermolysis bullosa, unspecified: Secondary | ICD-10-CM | POA: Diagnosis not present

## 2021-11-27 DIAGNOSIS — I872 Venous insufficiency (chronic) (peripheral): Secondary | ICD-10-CM | POA: Insufficient documentation

## 2021-11-27 NOTE — Progress Notes (Addendum)
JEREMIH, DEARMAS ([id number]) [id number] Page 1 of 12 Visit Report for 11/27/2021 Chief Complaint Document Details Patient Name: Date of Service: Carlos White 11/27/2021 12:45 PM Medical Record Number: [id number] Patient Account Number: [id number] Date of Birth/Sex: Treating RN: [birth year] (81 y.o. M) Primary Care Provider: [name] Other Clinician: Referring Provider: Treating Provider/Extender: [name] in Treatment: 217 Information Obtained from: Patient Chief Complaint Bilateral LE Ulcers Electronic Signature(s) Signed: 11/27/2021 12:34:44 PM By: Worthy Keeler PA-C Entered By: Worthy Keeler on 11/27/2021 12:34:44 -------------------------------------------------------------------------------- HPI Details Patient Name: Date of Service: Carlos White. 11/27/2021 12:45 PM Medical Record Number: [id number] Patient Account Number: [id number] Date of Birth/Sex: Treating RN: [birth year] (81 y.o. M) Primary Care Provider: [name] Other Clinician: Referring Provider: Treating Provider/Extender: [name] in Treatment: 217 History of Present Illness HPI Description: 09/30/17 on evaluation today patient presents for initial evaluation and our clinic concerning issues that he has been having with his bilateral lower extremities. He states this has been going on for quite some time at least six months. Currently his regiment has been mainly cleaning the area with peroxide, applying the is foreign ointment, and wrapping the area with ABD pads and then an ace wrap loosely. He has dealt with issues of this nature he tells me for quite some time. He does have a history of having had a compound fracture of the left lower extremity which he thinks also makes this a much more difficult area for him to heal. He's previously been told that he had poor vascular flow but this was years ago at [facility] we do  not have any of those records at this time. He has a history of Epidermolysis Bullosa which was diagnosed around age 49 and he has been cared for at [facility] since that time. Subsequently he states this is hereditary and two of his children one male and one male also have this as well is one of his grandchildren that he is aware of. He has no evidence of infection necessarily at this point although he does have some necrotic tissue noted on the surface of the wound as far as the largest, left lateral lower extremity ulcer, is concerned. Overall I feel like all things considered he's been taking care of this very well. Obviously he has some fairly significant issues going on at this point in this regard. He does have a history otherwise of hypertension though for the most part other than the compound fracture of the left leg he seems to have been fairly healthy in my pinion. 10/07/17 on evaluation today patient actually appears to be doing better in regard to his bilateral lower extremity ulcers. With that being said he does still have some evidence of slough noted on the surface of the wounds I think the Iodoflex has been beneficial for him. His arterial studies are scheduled for October 2. With that being said I do believe that he is continuing to show signs of good improvement which is at least good news. 10/14/17 on evaluation today patient appears to be doing very well in regard to his lower extremity ulcers. He's definitely made some progress as far as healing is concerned although there still are several open areas that are going to need to be addressed. He did have his arterial study today which fortunately shows good findings with a right ABI of 1.23 with a TBI of 0.86 in the left  ABI of 1.28 with a TBI of 0.81. This is good news and will allow [place] to perform debridement as well. 10/23/2017; patient with a large wound on the left lateral calf, sizable area on the left medial malleolus and an area on  the right lateral malleolus. He has a new blister consistent with his underlying blistering skin disease just above this area we have been using Iodoflex on the lateral left calf lateral right ankle and collagen on the medial left ankle. We have been using Kerlix Coban wraps 10/28/17 on evaluation today the patient continues to have signs of improvement in regard to the overall appearance of the original wound. Unfortunately he did have some blistering over the right lateral lower extremity which has appeared to rupture on evaluation today and likely some of the dead tissue on the surface needs to be cleaned away the good news is this does not appear to be to significantly deep at this time. 11/04/17 on evaluation today patient actually appears to be doing a little better in regard to his lower extremity ulcers. He has been tolerating the dressing changes without complication. With that being said he does still have a significant wound especially over the left lateral lower extremity unfortunately. All of the wounds pretty much are going to require sharp debridement today. Carlos White, Carlos White ([id number]) [id number] Page 2 of 12 11/11/17 on evaluation today patient appears to be doing more poorly in regard to his left lower extremity in particular. There does not appear to be any evidence of systemic infection although the wound itself as far as the larger left lateral lower extremity ulcer actually appears to be infected in my pinion. There's an older and the surface of the wound is dramatically worsened compared to last week. No fevers, chills, nausea, or vomiting noted at this time. 11/18/17 upon evaluation today patient actually appears to be doing better. I did review his culture today which really did not show any specific organism is a positive reason for his wound decline. There are multiple organisms present not predominant. Nonetheless he seems to be tolerate the  doxycycline well in his wounds in general do seem to be doing better. Fortunately there does not appear to be any evidence of infection at this time which is good news. Overall I'm very pleased with how things appear. Nonetheless he still has a lot of healing to go. I do think he could benefit from a Juxta-Lite wrap. 11/25/17 on evaluation today patient actually appears to be doing fairly well in regard to his wounds. He is still taking the antibiotics he has a few days left. Fortunately this seems to have been excellent for him as far as getting the infection control and very happy in this regard. With that being said the patient likewise is also very pleased with how things appear at this time in comparison to where we were he's not having as much pain. 12/02/17 Seen today for follow p and management of LLE wounds. Wounds appear to show some improvement. He denies pain, fever, or chills. Completed a course of doxycycline earlier this month. Scheduled to received Juxta-Lite wrap this week. No s/s of infections. 12/09/17 on evaluation today patient actually appears to be doing a little bit better in regard to his wounds. This is obscene very slow process and unfortunately he has a couple of new areas and this is due to the Epidermolysis Bullosa. Nonetheless I am concerned about the fact that he seems to be getting more areas  not less that is the reason we're gonna work on getting schedule for the vascular referral to see the venous specialist. 12/23/17 upon evaluation today patient's wounds currently shows evidence of still not doing quite as well is what I would like to have seen. Subsequently the patient did have his venous study which showed evidence of venous stasis. Subsequently I do think that a vascular evaluation for consideration of venous intervention would be appropriate. I'm not necessarily suggesting that will be anything that can be done but I think it is at least a good idea. He is in  agreement with this plan. 12/30/17 on evaluation today patient actually appears to be doing very well in regard to his wounds when compared to previous evaluation. Subsequently we have been using the [employer] [facility] Dressing which actually appears to have done excellent on his left lateral lower extremity ulcer. The quality of the wound surface is dramatically improved. There is some slight debridement that is going to be required at a couple of locations but overall I'm extremely pleased with how things appear here. 01/07/2018; this is a patient with a primary skin disorder epidermolyis bullosa. Is a large wound on the left lateral calf and smaller wounds on the right however there is a new wound on the right mid tibia area that occurred within the compression wrap that he did not change. We have been using Hydrofera Blue. On the left he is using Hydrofera Blue and Santyl to the inferior part of the wound and changing the dressing himself. 01/15/2018; primary skin disorder epidermolysis bullosa. He has several difficult wounds including the left lateral calf, smaller wounds on the left medial calf and the right lateral calf. The major area on the left lateral calf has a smaller area inferiorly that has necrotic debris we have been using Santyl to this. The rest of the wounds we have been using Hydrofera Blue. The area on the left calf actually looks larger this week. Uncontrolled edema several small open areas above it that are superficial 01/20/18 on evaluation today patient appears to be doing better as compared to last week in regard to his wounds of the bilateral lower extremities. He tolerated the bilateral compression wrap without complication. Overall I'm very pleased with how things appear at this time. The patient likewise is very happy. 01/27/18 on evaluation today patient appears to be doing decently well in regard to his bilateral lower extremity ulcers. He's been tolerating the dressing  changes without complication. One issue he had is that he did have more drainage to the left leg wrapped last week. He states in fact he probably should come in and let us change it on Friday however he just left it in place and kept adding extra absorption with ABD pads to the external portion of the wrap. Unfortunately he does have some aspiration type breakdown nothing significant but I do believe that this was probably counterproductive in general. Nonetheless his wounds do not appear to be terrible overall. 02/03/18 on evaluation today patient appears to be doing rather well in regard to his lower extremity ulcers. He has been tolerating the dressing changes without complication. He does tell me that he had to change the wrap on the left one since we last saw him. Subsequently I do not see any evidence of infection I do feel like the food was much better controlled at this point. 02/10/18 on evaluation today patient appears to be doing rather well in regard to his ulcers. He still  has significant alterations especially on the left lateral lower extremity. Fortunately there's no signs of infection at this time. Overall I feel like he is making good progress are some areas that I'm gonna attempt some debridement today. 02/17/18 on evaluation today patient appears to be doing okay in regard to his lower Trinity ulcer. It does appear on both locations he has a little bit of drainage causing some breakdown in maceration around the wound bed's although it doesn't appear to be too bad the right is a little bit worse than left. Fortunately there's no signs of infection which is good news. No fevers, chills, nausea, or vomiting noted at this time. 03/10/18 on evaluation today patient actually appears to be doing rather poorly in regard to his bilateral lower extremity ulcers. The right in particular is draining profusely and the wound is actually enlarging which is not good. I'm concerned about both possibly  infection and the fact that there's a lot of moisture which is causing breakdown as well. Unfortunately the patient has been trying to change this at home I'm afraid he may need to change more frequently in order to see the improvement that were looking for. There's no signs of systemic infection. 03/17/18 patient actually appears to be doing significantly better at this point in regard to his bilateral lower extremity ulcers. Fortunately there's no signs of infection. That is worsening infection at least indefinitely nothing systemic. With that being said he is having a lot of drainage though not quite as much is there in his last evaluation. Overall feel like he's on the side of improvement. I think if his results back from his culture which showed that he had a positive group B strep along with abundant Pseudomonas noted on the culture. For that reason I am gonna have him continue with the linezolid as we previously have ordered for him and I did go ahead as well today and prescribe Levaquin as well in order to treat the Pseudomonas portion of the infection noted. 03/24/18 on evaluation today patient actually appears to be doing very well in regard to his lower Trinity ulcer. He's been tolerating the dressing changes without complication. Fortunately both legs show signs of less drainage in his edema is very well controlled at this point as well. Overall very pleased with how things seem to be progressing. 03/31/18 on evaluation today patient actually appears to be doing excellent in regard to his bilateral lower extremity ulcers. These are not draining nearly as significant as what they were in the past overall seem to be shown signs of excellent improvement which is great news. Fortunately there is no sign of active infection at this time I do believe that the Levaquin has done extremely well for him in this regard. The patient continues to change these at home typically every day. We may be able to  slowly work towards every other day changes since the drainage seems to be slowing down quite significantly. 04/14/18 on evaluation today patient appears to be doing well in regard to his bilateral lower extremities. Let me Hilda Blades Almost completely healed which is excellent news. Fortunately he's shown signs of improvement all other sites as well with new skin growth there's some slight hyper granular tissue but for the most part this seems to be well maintained with the [facility] Dressing. I'm very happy in this regard. 04/28/18 on evaluation today patient appears to be doing rather well in regard to his ulcers of the bilateral lower extremities all  things considering. He continues to make some progress as far as new skin growth. There still some hyper granulation noted at this point despite the use of the [facility] Dressing. This is not terrible but I think we may want to consider conclude cauterization today with silver nitrate to try to help knock some of his back as well as helping with any biofilm on the surface of the wound. 05/12/18 on evaluation today patient's wounds actually appear to be doing fairly well in regard to the bilateral lower extremities. He's been tolerating the dressing changes without complication. Fortunately there's no signs of active infection at this time which is good news. Overall very pleased with how things seem to be progressing. You select silver nitrate was beneficial for him. Carlos White, Carlos White ([id number]) [id number] Page 3 of 12 05/26/18 on evaluation today patient appears to be doing better in regard to left lower extremity and a little bit worse in regard to the right lower extremity. He states that he was pulling off the [facility] Dressing peel back some of the skin making this area significantly larger than what it was previous. He's not had any issues other than this and states even that hasn't caused any pain he just  seems to obviously have a much larger area on the right when compared to the previous time I saw him. No fevers, chills, nausea, or vomiting noted at this time. 06/16/18 on evaluation today patient actually appears to be doing a little better in my pinion in regard to his lower summary ulcers. He has new skin islands that seem to be spreading which is good news. Fortunately there's no signs of active infection at this time. His biggest issue is he tells me that coming as often as he does is becoming very cost prohibitive. He wonders if we can potentially spread this out. 07/14/18 on evaluation today patient appears to be doing a little bit worse in regard to his lower from the ulcer. Unfortunately he still continues to have a significant amount of drainage I think we need to do something to try to help this more. He is still somewhat reluctant to go the route of the Wound VAC although that may be the most appropriate thing for him. No fevers, chills, nausea, or vomiting noted at this time. 08/18/2018 on evaluation today patient actually appears to be doing quite well with regard to his bilateral lower extremity ulcers. I do feel like that currently he is making great progress the care max does seem to be doing a great job at helping to control the moisture he has no maceration or skin breakdown. Again this seems to be an excellent way to go. 1 thing we may want to change is adding collagen to the base of the wound and then the care max over top he is not opposed to this. 09/15/2018 on evaluation today patient appears to be doing well with regard to his bilateral lower extremity ulcers. He is showing some signs of improvement not necessarily in size but definitely in appearance. In fact he has a lot of new skin growing throughout the wounds along the edges as well as in the central portion of the wounds on both lower extremities. Overall I am extremely pleased to see this. 10/20/2018 on evaluation today patient  actually appears to be doing quite well with regard to his wounds. They are not measuring significantly smaller but he does have a lot of new epithelization noted as compared to previous.  Fortunately there is no signs of active infection at this time. No fevers, chills, nausea, vomiting, or diarrhea. 11/17/2018 on evaluation today patient presents for reevaluation concerning his bilateral lower extremity ulcers. Fortunately there is no signs of active infection at this time today. He has been tolerating the dressing changes without complication. No fevers, chills, nausea, vomiting, or diarrhea. Unfortunately in general the patient has not made as much improvement as I would like to have seen up to this point. He has been tolerating the dressing changes without complication and he does an excellent job taking care of his wounds at home in my opinion. The biggest issue I see is that he is just not making the progress that we need to be seeing currently. I think we may want to consider having him seen at a plastic surgery appointment and he has previously seen someone in years past at [facility] in [place]. That is definitely a possibility for [place] to look into at this point. 12/29/2018 on evaluation today patient appears to be doing better in regard to the overall visual appearance of his wounds which do not appear to be as macerated. He does have a much larger skin island in the middle of the left lower extremity ulcer which is doing much better. He tells me the pain is also significantly better. With that being said overall his improvement as far as the size of the wounds is not better but again these are very irregular in change shape quite often. Fortunately there is no evidence of active infection at this time which is great news. He never heard anything from [facility] regarding the plastic surgery referral that we made to them. 01/26/2019 upon evaluation today patient appears to be  doing a little better in regard to his wounds today. He has been tolerating the dressing changes again he performs these for the most part on his own. He does a great job wrapping his legs in my opinion. Unfortunately he has not been able to get down to [facility] to see if there is anything from a plastic surgery standpoint that could be done to help with his legs simply due to the fact that his wife unfortunately sustained a compression fracture in her spine she is seeing Dr. Saintclair Halsted and subsequently is going to be having what sounds to be a kyphoplasty type procedure. With that being said that has not been scheduled yet there is still waiting on an MRI the patient is very busy in fact overly busy trying to help take care of his wife at this point. I completely understand this is more of a strain on him at this time 02/23/2019 upon evaluation today patient actually appears to be making some progress. I am actually very pleased with the overall appearance of his wounds even compared to last evaluation. He seems to be doing quite well. He is taking care of his wife unfortunately she did have a compression fracture she has had the procedure for this but still she has a slow road to recovery. For that reason he still not gone to [facility] for a second opinion in this regard. Obviously the goal there was if there was anything that can be done from a skin graft standpoint or otherwise. 03/23/2019 upon evaluation today patient continues to have issues with lower extremity ulcers. Since the beginning he has made progress but at the same time the wounds unfortunately just will not close. We have been trying to get the patient to  [facility] to see a specialist there but unfortunately with the everything going on with his wife he has not been able to make that appointment time yet he states he may be able to in the next 1-2 months but is not really sure. 04/27/2019 on evaluation today patient  appears to be doing a little bit more poorly. His last evaluation. He appears to have some erythema around the edges of the wound at this point. Fortunately there is no signs of active infection at this time which is good news. No fevers, chills, nausea, vomiting, or diarrhea. 06/08/2019 on evaluation today patient appears to be doing well with regard to his wounds. Overall they are actually measuring smaller compared to the last visit last month. We did treat him for Pseudomonas as well as methicillin-resistant Staph aureus. He was only on the treatment for MRSA however for 7 days as the Cipro was resistant and subsequently we had to place him on doxycycline. Nonetheless I am thinking that we may want to add the doxycycline and just do a month-long treatment considering the longstanding nature of his wounds and see if we get this under better control. 07/13/2019 upon evaluation today patient appears to be doing fairly well in regard to his bilateral lower extremities. There does not appear to be any signs of active infection which is good news. No fevers, chills, nausea, vomiting, or diarrhea. 08/10/2019 upon evaluation today patient appears to be doing about the same with regard to his wounds in general. Unfortunately he is not significantly better although is also not significantly worse which is great news there is no evidence of active infection at this time which is good news. He still dealing with a lot going on with his wife and therefore is not really able to go see anyone at the specialty clinic at [facility] that we have previously set up still. 09/07/2019 on evaluation today patient appears to be doing well with regard to his wounds. In fact this is probably the best that have seen so far in quite a few months. Overall I am very pleased with where things stand at this time. No fevers, chills, nausea, vomiting, or diarrhea. 10/05/2019 upon evaluation today patient appears to be doing more poorly in  regard to his legs at this point. He actually is showing some signs of infection. This has been something that we seem to be back-and-forth with as far as trying to keep these wounds from becoming infected. He takes care of them very well in my opinion but nonetheless I am concerned in this regard. He tells me that his wife is still doing really about the same she is slowly getting better. Nonetheless he still spends the majority of his time helping to take care of her. 11/02/2019 upon evaluation today patient actually appears to be doing somewhat better in regard to his legs. I do believe that the compounded antibiotic treatment from [facility] has been beneficial for him. Overall I am extremely pleased with where things stand today. There is no signs of active infection at this time. The patient states he has much less drainage than he has in the past. 12/07/2019 upon evaluation today patient appears to be doing well at this time in regard to the overall appearance of his wound bed. Currently there is no signs of active infection at this time. 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 Especially when he stressed. Fortunately there is no signs of active infection locally or systemically at this point. 01/18/2020 on evaluation today patient appears to be doing well with regard to his leg ulcers. He has been tolerating the dressing changes without complication. Fortunately there is no signs of infection and overall I feel like his legs are doing about the best they have done in quite some time. There does not appear to be any evidence of active infection which is great news and overall very pleased. DANYON, MCGINNESS ([id number]) [id number] Page 4 of 12 02/29/2020 upon evaluation today patient's wounds actually appear  to be doing quite well currently. There is no sign of active infection at this time. No fevers, chills, nausea, vomiting, or diarrhea. 04/11/2025 on evaluation today patient appears to be doing excellent in regard to his wounds on the legs to be honest. He has made a lot of progress there does not appear to be any signs of active infection and overall I think that he is better than previous although still the wounds are quite significant obviously. In general I think that he is pleased with where things stand at this point. Still there is quite a bit of work to do here. 07/13/2020 upon evaluation today patient appears to be doing well with regard to his wound. Is been tolerating the dressing changes without complication on both legs. He just has the 2 main wounds on each leg at the ankle on the medial region of the right leg is completely healed. His wounds do seem to have gotten better during the time that he has been on bedrest as result of the pelvis fracture. Obviously does not the way that we wanted to see things improved but nonetheless he tells me that it is what it is. Fortunately he does seem to be doing better he tells me that her daughter from the [place] is actually down helping to take care of them out as he and his wife while he is recovering. 08/08/2020 upon evaluation today patient appears to be doing well currently in regard to his leg ulcers. He has been tolerating the dressing changes without complication. Fortunately there is no signs of active infection at this time. No fevers, chills, nausea, vomiting, or diarrhea. 09/26/2020 upon evaluation today patient appears to be doing well with regard to his wound. He has been showing signs of good improvement which is great news and overall I am extremely pleased with where things stand today. I do think that he does well with his dressing changes and overall has really improved significantly here. segment how that how she doing 11/14/2020 upon  evaluation today patient appears to be doing well with regard to his leg ulcers. He has been tolerating the dressing changes without complication. Fortunately there does not appear to be any signs of active infection at this time. No fevers, chills, nausea, vomiting, or diarrhea. 12/26/2020 upon evaluation today patient actually appears to be making some good progress here in regard to his wounds. Fortunately I do not see any signs of active infection which is great news. No fevers, chills, nausea, vomiting, or diarrhea. 02/06/2021 upon evaluation today patient appears to be doing well with regard to his wound currently. He has been tolerating the dressing changes without complication. Fortunately there does not appear to be any evidence of active infection locally nor systemically at this time. No fevers,  chills, nausea, vomiting, or diarrhea. Overall I think that this is very slow but does seem to be getting a little bit better and a little smaller he has been through a lot of stress recently with his wife she is having a heart evaluation tomorrow to see whether or not an aortic valve replacement is something that she could tolerate and undergo. 04/03/2020 upon evaluation today patient's legs are actually showing signs of significant improvement. This is actually a dramatic overall improvement since I last saw him I think we are getting very close to closing these wounds. That is an amazing thing is we been dealing with this for quite some time. The patient is very pleased to hear things appear to be doing well as well. 05-15-2021 upon evaluation today patient appears to be doing well with regard to his wound on the bilateral lower extremities. Both are showing signs of excellent improvement which is great news. There does not appear to be any signs of active infection locally nor systemically at this time. No fevers, chills, nausea, vomiting, or diarrhea. 06-26-2021 upon evaluation today patient appears  to be doing well with regard to his wounds on the legs. They are actually showing signs of improvement in my opinion which is great news. Fortunately I do not see any evidence of active infection locally or systemically which is great news. No fevers, chills, nausea, vomiting, or diarrhea. 08-21-2021 upon evaluation today patient appears to be making excellent progress. Has been using a mixture of topical antibiotic ointment followed by the ABD pads which does seem to be doing quite well. I do not see any evidence of active infection locally or systemically at this time which is great news. With that being said I think that he is very close to complete resolution. 10-09-2021 upon evaluation today patient appears to be doing excellent in regard to his wound. In fact both legs are actually looking well. He has a lot of new skin very few openings remaining at this point. Fortunately I do not see any signs of active infection locally or systemically at this time. 11-27-2021 upon evaluation today patient appears to be doing well currently in regard to his wounds. He has been tolerating the dressing changes without complication. Fortunately there does not appear to be any signs of active infection at this time which is great news. No fevers, chills, nausea, vomiting, or diarrhea. 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 Electronic Signature(s) Signed: 11/27/2021 1:17:18 PM By: SWorthy KeelerPA-C Entered By: SWorthy Keeleron 11/27/2021 13:17:17 -------------------------------------------------------------------------------- Physical Exam Details Patient Name: Date of Service: RLelon Frohlich11/15/2023 12:45 PM Medical Record Number: [id number] Account Number: [id number] of Birth/Sex: Treating RN: [birth year](81y.o. M) Primary  Care Provider: BTedra SenegalOther Clinician: Referring Provider: Treating Provider/Extender: SCarin Hockin Treatment: 217 Constitutional Well-nourished and well-hydrated in no acute distress. Respiratory normal breathing without difficulty. Psychiatric RKADEN, DAUGHDRILL([id number] Page 5 of 12 this patient is able to make decisions and demonstrates good insight into disease process. Alert and Oriented x 3. pleasant and cooperative. Notes Patient's wounds actually are significantly smaller compared to the last time I saw him in a very pleased in that regard. Fortunately I see no signs of infection locally or systemically  which is great news. No fevers, chills, nausea, vomiting, or diarrhea. Electronic Signature(s) Signed: 11/27/2021 1:17:47 PM By: Worthy Keeler PA-C Entered By: Worthy Keeler on 11/27/2021 13:17:47 -------------------------------------------------------------------------------- Physician Orders Details Patient Name: Date of Service: Carlos White 11/27/2021 12:45 PM Medical Record Number: [id number] Patient Account Number: [id number] Date of Birth/Sex: Treating RN: Sep 10, 1940 (81 y.o. 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 Bathing/ Shower/ Hygiene May shower and wash wound with soap and water. - on days that dressing is changed Edema Control - Lymphedema / SCD / Other Bilateral Lower Extremities Elevate legs to the level of the heart or above for 30 minutes daily and/or when sitting, a frequency of: - throughout the day Avoid standing for long periods of time. Exercise regularly Moisturize legs daily. Compression stocking or Garment 20-30 mm/Hg pressure to: - Juxtalite to both legs daily Wound Treatment Wound #2 - Lower Leg Wound Laterality: Left, Lateral, Distal Cleanser: Soap and Water 3 x Per Week/30 Days Discharge Instructions: May shower and wash wound with dial antibacterial soap and water prior to dressing change. Peri-Wound Care: Zinc Oxide Ointment 30g tube 3 x Per Week/30 Days Discharge Instructions: Apply Zinc Oxide to periwound with each dressing change as needed for maceration Peri-Wound Care: Sween Lotion (Moisturizing lotion) 3 x Per Week/30 Days Discharge Instructions: Apply moisturizing lotion as directed Topical: bacitracin 3 x Per Week/30 Days Discharge Instructions: apply three times a week. Secondary Dressing: ABD Pad, 5x9 (Generic) 3 x Per Week/30 Days Discharge Instructions: Apply over primary dressing as directed. Secured With: Elastic Bandage 4 inch (ACE bandage) 3 x Per Week/30 Days Discharge Instructions: Secure with ACE bandage as directed. Carlos White, Carlos White ([id number]) [id number] Page 6 of 12 Secured With: [employer], 4.5x3.1 (in/yd) (Generic) 3 x Per Week/30 Days Discharge Instructions: Secure with Kerlix as directed. Wound #5 - Lower Leg Wound Laterality: Right, Lateral, Anterior Cleanser: Soap and Water 3 x Per Week/30 Days Discharge Instructions: May shower and wash wound with dial antibacterial soap and water prior to dressing change. Peri-Wound Care:  Zinc Oxide Ointment 30g tube 3 x Per Week/30 Days Discharge Instructions: Apply Zinc Oxide to periwound with each dressing change as needed for maceration Peri-Wound Care: Sween Lotion (Moisturizing lotion) 3 x Per Week/30 Days Discharge Instructions: Apply moisturizing lotion as directed Topical: bacitracin 3 x Per Week/30 Days Discharge Instructions: apply three times a week. Secondary Dressing: ABD Pad, 5x9 (Generic) 3 x Per Week/30 Days Discharge Instructions: Apply over primary dressing as directed. Secured With: Elastic Bandage 4 inch (ACE bandage) 3 x Per Week/30 Days Discharge Instructions: Secure with ACE bandage as directed. Secured With: [employer], 4.5x3.1 (in/yd) (Generic) 3 x Per Week/30 Days Discharge Instructions: Secure with Kerlix as directed. Electronic Signature(s) Signed: 11/27/2021 3:58:30 PM By: Worthy Keeler PA-C Signed: 11/29/2021 12:09:11 PM By: Rhae Hammock RN  Entered By: Rhae Hammock on 11/27/2021 13:11:49 -------------------------------------------------------------------------------- Problem List Details Patient Name: Date of Service: Carlos White 11/27/2021 12:45 PM Medical Record Number: [id number] Patient Account Number: [id number] Date of Birth/Sex: Treating RN: 06-Oct-1940 (81 y.o. 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 09/30/2017 No Yes L97.322 Non-pressure chronic ulcer of left ankle with fat layer exposed 09/30/2017 No Yes L97.822 Non-pressure chronic ulcer of other part of left lower leg with fat layer exposed9/18/2019 No Yes L97.812 Non-pressure chronic ulcer of other part of right lower leg with fat layer 09/30/2017 No  Yes exposed Carlos White, Carlos White ([id number]) [id number] Page 7 of 12 I10 Essential (primary) hypertension 09/30/2017 No Yes Inactive Problems Resolved Problems Electronic Signature(s) Signed: 11/27/2021 12:34:19 PM By: Worthy Keeler PA-C Entered By: Worthy Keeler on 11/27/2021 12:34:18 -------------------------------------------------------------------------------- Progress Note Details Patient Name: Date of Service: Carlos White. 11/27/2021 12:45 PM Medical Record Number: [id number] Patient Account Number: [id number] Date of Birth/Sex: Treating RN: Feb 22, 1940 (81 y.o. M) Primary Care Provider: [name] Other Clinician: Referring Provider: Treating Provider/Extender: [name] in Treatment: 217 Subjective Chief Complaint Information obtained from Patient Bilateral LE Ulcers History of Present Illness (HPI) 09/30/17 on evaluation today patient presents for initial evaluation and our clinic concerning issues that he has been having with his bilateral lower extremities. He states this has been going on for quite some time at least six months. Currently his regiment has been mainly cleaning the area with peroxide, applying the is foreign ointment, and wrapping the area with ABD pads and then an ace wrap loosely. He has dealt with issues of this nature he tells me for quite some time. He does have a history of having had a compound fracture of the left lower extremity which he thinks also makes this a much more difficult area for him to heal. He's previously been told that he had poor vascular flow but this was years ago at [facility] we do not have any of those records at this time. He has a history of Epidermolysis Bullosa which was diagnosed around age 10 and he has been cared for at [facility] since that time. Subsequently he states this is hereditary and two of his children one male and one male also have this as well is one of his  grandchildren that he is aware of. He has no evidence of infection necessarily at this point although he does have some necrotic tissue noted on the surface of the wound as far as the largest, left lateral lower extremity ulcer, is concerned. Overall I feel like all things considered he's been taking care of this very well. Obviously he has some fairly significant issues going on at this point in this regard. He does have a history otherwise of hypertension though for the most part other than the compound fracture of the left leg he seems to have been fairly healthy in my pinion. 10/07/17 on evaluation today patient actually appears to be doing better in regard to his bilateral lower extremity ulcers. With that being said he does still have some evidence of slough noted on the surface of the wounds I think the Iodoflex has been beneficial for him. His arterial studies are scheduled for October 2. With that being  said I do believe that he is continuing to show signs of good improvement which is at least good news. 10/14/17 on evaluation today patient appears to be doing very well in regard to his lower extremity ulcers. He's definitely made some progress as far as healing is concerned although there still are several open areas that are going to need to be addressed. He did have his arterial study today which fortunately shows good findings with a right ABI of 1.23 with a TBI of 0.86 in the left ABI of 1.28 with a TBI of 0.81. This is good news and will allow [place] to perform debridement as well. 10/23/2017; patient with a large wound on the left lateral calf, sizable area on the left medial malleolus and an area on the right lateral malleolus. He has a new blister consistent with his underlying blistering skin disease just above this area we have been using Iodoflex on the lateral left calf lateral right ankle and collagen on the medial left ankle. We have been using Kerlix Coban wraps 10/28/17 on  evaluation today the patient continues to have signs of improvement in regard to the overall appearance of the original wound. Unfortunately he did have some blistering over the right lateral lower extremity which has appeared to rupture on evaluation today and likely some of the dead tissue on the surface needs to be cleaned away the good news is this does not appear to be to significantly deep at this time. 11/04/17 on evaluation today patient actually appears to be doing a little better in regard to his lower extremity ulcers. He has been tolerating the dressing changes without complication. With that being said he does still have a significant wound especially over the left lateral lower extremity unfortunately. All of the wounds pretty much are going to require sharp debridement today. 11/11/17 on evaluation today patient appears to be doing more poorly in regard to his left lower extremity in particular. There does not appear to be any evidence of systemic infection although the wound itself as far as the larger left lateral lower extremity ulcer actually appears to be infected in my pinion. There's an older and the surface of the wound is dramatically worsened compared to last week. No fevers, chills, nausea, or vomiting noted at this time. 11/18/17 upon evaluation today patient actually appears to be doing better. I did review his culture today which really did not show any specific organism is a positive reason for his wound decline. There are multiple organisms present not predominant. Nonetheless he seems to be tolerate the doxycycline well in his wounds in general do seem to be doing better. Fortunately there does not appear to be any evidence of infection at this time which is good news. Overall I'm very pleased with how things appear. Nonetheless he still has a lot of healing to go. I do think he could benefit from a Juxta-Lite wrap. 11/25/17 on evaluation today patient actually appears to  be doing fairly well in regard to his wounds. He is still taking the antibiotics he has a few days left. Fortunately this seems to have been excellent for him as far as getting the infection control and very happy in this regard. With that being said the patient likewise is also very pleased with how things appear at this time in comparison to where we were he's not having as much pain. Carlos White, Carlos White ([id number]) [id number] Page 8 of 12 12/02/17 Seen today for follow p and management of  LLE wounds. Wounds appear to show some improvement. He denies pain, fever, or chills. Completed a course of doxycycline earlier this month. Scheduled to received Juxta-Lite wrap this week. No s/s of infections. 12/09/17 on evaluation today patient actually appears to be doing a little bit better in regard to his wounds. This is obscene very slow process and unfortunately he has a couple of new areas and this is due to the Epidermolysis Bullosa. Nonetheless I am concerned about the fact that he seems to be getting more areas not less that is the reason we're gonna work on getting schedule for the vascular referral to see the venous specialist. 12/23/17 upon evaluation today patient's wounds currently shows evidence of still not doing quite as well is what I would like to have seen. Subsequently the patient did have his venous study which showed evidence of venous stasis. Subsequently I do think that a vascular evaluation for consideration of venous intervention would be appropriate. I'm not necessarily suggesting that will be anything that can be done but I think it is at least a good idea. He is in agreement with this plan. 12/30/17 on evaluation today patient actually appears to be doing very well in regard to his wounds when compared to previous evaluation. Subsequently we have been using the [facility] Dressing which actually appears to have done excellent on his left lateral lower  extremity ulcer. The quality of the wound surface is dramatically improved. There is some slight debridement that is going to be required at a couple of locations but overall I'm extremely pleased with how things appear here. 01/07/2018; this is a patient with a primary skin disorder epidermolyis bullosa. Is a large wound on the left lateral calf and smaller wounds on the right however there is a new wound on the right mid tibia area that occurred within the compression wrap that he did not change. We have been using Hydrofera Blue. On the left he is using Hydrofera Blue and Santyl to the inferior part of the wound and changing the dressing himself. 01/15/2018; primary skin disorder epidermolysis bullosa. He has several difficult wounds including the left lateral calf, smaller wounds on the left medial calf and the right lateral calf. The major area on the left lateral calf has a smaller area inferiorly that has necrotic debris we have been using Santyl to this. The rest of the wounds we have been using Hydrofera Blue. The area on the left calf actually looks larger this week. Uncontrolled edema several small open areas above it that are superficial 01/20/18 on evaluation today patient appears to be doing better as compared to last week in regard to his wounds of the bilateral lower extremities. He tolerated the bilateral compression wrap without complication. Overall I'm very pleased with how things appear at this time. The patient likewise is very happy. 01/27/18 on evaluation today patient appears to be doing decently well in regard to his bilateral lower extremity ulcers. He's been tolerating the dressing changes without complication. One issue he had is that he did have more drainage to the left leg wrapped last week. He states in fact he probably should come in and let us change it on Friday however he just left it in place and kept adding extra absorption with ABD pads to the external portion of the  wrap. Unfortunately he does have some aspiration type breakdown nothing significant but I do believe that this was probably counterproductive in general. Nonetheless his wounds do not appear to  be terrible overall. 02/03/18 on evaluation today patient appears to be doing rather well in regard to his lower extremity ulcers. He has been tolerating the dressing changes without complication. He does tell me that he had to change the wrap on the left one since we last saw him. Subsequently I do not see any evidence of infection I do feel like the food was much better controlled at this point. 02/10/18 on evaluation today patient appears to be doing rather well in regard to his ulcers. He still has significant alterations especially on the left lateral lower extremity. Fortunately there's no signs of infection at this time. Overall I feel like he is making good progress are some areas that I'm gonna attempt some debridement today. 02/17/18 on evaluation today patient appears to be doing okay in regard to his lower [place] ulcer. It does appear on both locations he has a little bit of drainage causing some breakdown in maceration around the wound bed's although it doesn't appear to be too bad the right is a little bit worse than left. Fortunately there's no signs of infection which is good news. No fevers, chills, nausea, or vomiting noted at this time. 03/10/18 on evaluation today patient actually appears to be doing rather poorly in regard to his bilateral lower extremity ulcers. The right in particular is draining profusely and the wound is actually enlarging which is not good. I'm concerned about both possibly infection and the fact that there's a lot of moisture which is causing breakdown as well. Unfortunately the patient has been trying to change this at home I'm afraid he may need to change more frequently in order to see the improvement that were looking for. There's no signs of systemic  infection. 03/17/18 patient actually appears to be doing significantly better at this point in regard to his bilateral lower extremity ulcers. Fortunately there's no signs of infection. That is worsening infection at least indefinitely nothing systemic. With that being said he is having a lot of drainage though not quite as much is there in his last evaluation. Overall feel like he's on the side of improvement. I think if his results back from his culture which showed that he had a positive group B strep along with abundant Pseudomonas noted on the culture. For that reason I am gonna have him continue with the linezolid as we previously have ordered for him and I did go ahead as well today and prescribe Levaquin as well in order to treat the Pseudomonas portion of the infection noted. 03/24/18 on evaluation today patient actually appears to be doing very well in regard to his lower [place] ulcer. He's been tolerating the dressing changes without complication. Fortunately both legs show signs of less drainage in his edema is very well controlled at this point as well. Overall very pleased with how things seem to be progressing. 03/31/18 on evaluation today patient actually appears to be doing excellent in regard to his bilateral lower extremity ulcers. These are not draining nearly as significant as what they were in the past overall seem to be shown signs of excellent improvement which is great news. Fortunately there is no sign of active infection at this time I do believe that the Levaquin has done extremely well for him in this regard. The patient continues to change these at home typically every day. We may be able to slowly work towards every other day changes since the drainage seems to be slowing down quite significantly. 04/14/18 on evaluation  today patient appears to be doing well in regard to his bilateral lower extremities. Let me [facility] Almost completely healed which is excellent news.  Fortunately he's shown signs of improvement all other sites as well with new skin growth there's some slight hyper granular tissue but for the most part this seems to be well maintained with the [facility] Dressing. I'm very happy in this regard. 04/28/18 on evaluation today patient appears to be doing rather well in regard to his ulcers of the bilateral lower extremities all things considering. He continues to make some progress as far as new skin growth. There still some hyper granulation noted at this point despite the use of the [facility] Dressing. This is not terrible but I think we may want to consider conclude cauterization today with silver nitrate to try to help knock some of his back as well as helping with any biofilm on the surface of the wound. 05/12/18 on evaluation today patient's wounds actually appear to be doing fairly well in regard to the bilateral lower extremities. He's been tolerating the dressing changes without complication. Fortunately there's no signs of active infection at this time which is good news. Overall very pleased with how things seem to be progressing. You select silver nitrate was beneficial for him. 05/26/18 on evaluation today patient appears to be doing better in regard to left lower extremity and a little bit worse in regard to the right lower extremity. He states that he was pulling off the [facility] Dressing peel back some of the skin making this area significantly larger than what it was previous. He's not had any issues other than this and states even that hasn't caused any pain he just seems to obviously have a much larger area on the right when compared to the previous time I saw him. No fevers, chills, nausea, or vomiting noted at this time. 06/16/18 on evaluation today patient actually appears to be doing a little better in my pinion in regard to his lower summary ulcers. He has new skin islands that seem to be spreading which is good news.  Fortunately there's no signs of active infection at this time. His biggest issue is he tells me that coming as often as he does is becoming very cost prohibitive. He wonders if we can potentially spread this out. 07/14/18 on evaluation today patient appears to be doing a little bit worse in regard to his lower from the ulcer. Unfortunately he still continues to have a significant amount of drainage I think we need to do something to try to help this more. He is still somewhat reluctant to go the route of the Wound VAC although that may be the most appropriate thing for him. No fevers, chills, nausea, or vomiting noted at this time. 08/18/2018 on evaluation today patient actually appears to be doing quite well with regard to his bilateral lower extremity ulcers. I do feel like that currently he is Carlos White, Carlos White ([id number]) [id number] Page 9 of 12 making great progress the care max does seem to be doing a great job at helping to control the moisture he has no maceration or skin breakdown. Again this seems to be an excellent way to go. 1 thing we may want to change is adding collagen to the base of the wound and then the care max over top he is not opposed to this. 09/15/2018 on evaluation today patient appears to be doing well with regard to his bilateral lower extremity ulcers. He  is showing some signs of improvement not necessarily in size but definitely in appearance. In fact he has a lot of new skin growing throughout the wounds along the edges as well as in the central portion of the wounds on both lower extremities. Overall I am extremely pleased to see this. 10/20/2018 on evaluation today patient actually appears to be doing quite well with regard to his wounds. They are not measuring significantly smaller but he does have a lot of new epithelization noted as compared to previous. Fortunately there is no signs of active infection at this time. No fevers, chills,  nausea, vomiting, or diarrhea. 11/17/2018 on evaluation today patient presents for reevaluation concerning his bilateral lower extremity ulcers. Fortunately there is no signs of active infection at this time today. He has been tolerating the dressing changes without complication. No fevers, chills, nausea, vomiting, or diarrhea. Unfortunately in general the patient has not made as much improvement as I would like to have seen up to this point. He has been tolerating the dressing changes without complication and he does an excellent job taking care of his wounds at home in my opinion. The biggest issue I see is that he is just not making the progress that we need to be seeing currently. I think we may want to consider having him seen at a plastic surgery appointment and he has previously seen someone in years past at [facility] in [place]. That is definitely a possibility for [place] to look into at this point. 12/29/2018 on evaluation today patient appears to be doing better in regard to the overall visual appearance of his wounds which do not appear to be as macerated. He does have a much larger skin island in the middle of the left lower extremity ulcer which is doing much better. He tells me the pain is also significantly better. With that being said overall his improvement as far as the size of the wounds is not better but again these are very irregular in change shape quite often. Fortunately there is no evidence of active infection at this time which is great news. He never heard anything from [facility] regarding the plastic surgery referral that we made to them. 01/26/2019 upon evaluation today patient appears to be doing a little better in regard to his wounds today. He has been tolerating the dressing changes again he performs these for the most part on his own. He does a great job wrapping his legs in my opinion. Unfortunately he has not been able to get down to [facility] to see  if there is anything from a plastic surgery standpoint that could be done to help with his legs simply due to the fact that his wife unfortunately sustained a compression fracture in her spine she is seeing Dr. Saintclair Halsted and subsequently is going to be having what sounds to be a kyphoplasty type procedure. With that being said that has not been scheduled yet there is still waiting on an MRI the patient is very busy in fact overly busy trying to help take care of his wife at this point. I completely understand this is more of a strain on him at this time 02/23/2019 upon evaluation today patient actually appears to be making some progress. I am actually very pleased with the overall appearance of his wounds even compared to last evaluation. He seems to be doing quite well. He is taking care of his wife unfortunately she did have a compression fracture she has had  the procedure for this but still she has a slow road to recovery. For that reason he still not gone to [facility] for a second opinion in this regard. Obviously the goal there was if there was anything that can be done from a skin graft standpoint or otherwise. 03/23/2019 upon evaluation today patient continues to have issues with lower extremity ulcers. Since the beginning he has made progress but at the same time the wounds unfortunately just will not close. We have been trying to get the patient to [facility] to see a specialist there but unfortunately with the everything going on with his wife he has not been able to make that appointment time yet he states he may be able to in the next 1-2 months but is not really sure. 04/27/2019 on evaluation today patient appears to be doing a little bit more poorly. His last evaluation. He appears to have some erythema around the edges of the wound at this point. Fortunately there is no signs of active infection at this time which is good news. No fevers, chills, nausea, vomiting, or  diarrhea. 06/08/2019 on evaluation today patient appears to be doing well with regard to his wounds. Overall they are actually measuring smaller compared to the last visit last month. We did treat him for Pseudomonas as well as methicillin-resistant Staph aureus. He was only on the treatment for MRSA however for 7 days as the Cipro was resistant and subsequently we had to place him on doxycycline. Nonetheless I am thinking that we may want to add the doxycycline and just do a month-long treatment considering the longstanding nature of his wounds and see if we get this under better control. 07/13/2019 upon evaluation today patient appears to be doing fairly well in regard to his bilateral lower extremities. There does not appear to be any signs of active infection which is good news. No fevers, chills, nausea, vomiting, or diarrhea. 08/10/2019 upon evaluation today patient appears to be doing about the same with regard to his wounds in general. Unfortunately he is not significantly better although is also not significantly worse which is great news there is no evidence of active infection at this time which is good news. He still dealing with a lot going on with his wife and therefore is not really able to go see anyone at the specialty clinic at [facility] that we have previously set up still. 09/07/2019 on evaluation today patient appears to be doing well with regard to his wounds. In fact this is probably the best that have seen so far in quite a few months. Overall I am very pleased with where things stand at this time. No fevers, chills, nausea, vomiting, or diarrhea. 10/05/2019 upon evaluation today patient appears to be doing more poorly in regard to his legs at this point. He actually is showing some signs of infection. This has been something that we seem to be back-and-forth with as far as trying to keep these wounds from becoming infected. He takes care of them very well in my opinion but nonetheless  I am concerned in this regard. He tells me that his wife is still doing really about the same she is slowly getting better. Nonetheless he still spends the majority of his time helping to take care of her. 11/02/2019 upon evaluation today patient actually appears to be doing somewhat better in regard to his legs. I do believe that the compounded antibiotic treatment from [facility] has been beneficial for  him. Overall I am extremely pleased with where things stand today. There is no signs of active infection at this time. The patient states he has much less drainage than he has in the past. 12/07/2019 upon evaluation today patient appears to be doing well at this time in regard to the overall appearance of his wound bed. Currently there is no signs of active infection at this time. 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 Especially when he stressed. Fortunately there is no signs of active infection locally or systemically at this point. 01/18/2020 on evaluation today patient appears to be doing well with regard to his leg ulcers. He has been tolerating the dressing changes without complication. Fortunately there is no signs of infection and overall I feel like his legs are doing about the best they have done in quite some time. There does not appear to be any evidence of active infection which is great news and overall very pleased. 02/29/2020 upon evaluation today patient's wounds actually appear to be doing quite well currently. There is no sign of active infection at this time. No fevers, chills, nausea, vomiting, or diarrhea. 04/11/2025 on evaluation today patient appears to be doing excellent in regard to his wounds on the legs to be honest. He has made a lot of progress there does not appear to be any signs of active infection and  overall I think that he is better than previous although still the wounds are quite significant obviously. In general I think that he is pleased with where things stand at this point. Still there is quite a bit of work to do here. 07/13/2020 upon evaluation today patient appears to be doing well with regard to his wound. Is been tolerating the dressing changes without complication on both legs. He just has the 2 main wounds on each leg at the ankle on the medial region of the right leg is completely healed. His wounds do seem to have gotten better during the time that he has been on bedrest as result of the pelvis fracture. Obviously does not the way that we wanted to see things improved but nonetheless he tells me that it is what it is. Fortunately he does seem to be doing better he tells me that her daughter from the [place] is actually down helping to take care of them out as he and his wife while he is recovering. 08/08/2020 upon evaluation today patient appears to be doing well currently in regard to his leg ulcers. He has been tolerating the dressing changes without EDRAS, WILFORD ([id number]) [id number] Page 10 of 12 complication. Fortunately there is no signs of active infection at this time. No fevers, chills, nausea, vomiting, or diarrhea. 09/26/2020 upon evaluation today patient appears to be doing well with regard to his wound. He has been showing signs of good improvement which is great news and overall I am extremely pleased with where things stand today. I do think that he does well with his dressing changes and overall has really improved significantly here. segment how that how she doing 11/14/2020 upon evaluation today patient appears to be doing well with regard to his leg ulcers. He has been tolerating the dressing changes without complication. Fortunately there does not appear to be any signs of active infection at this time. No fevers, chills,  nausea,  vomiting, or diarrhea. 12/26/2020 upon evaluation today patient actually appears to be making some good progress here in regard to his wounds. Fortunately I do not see any signs of active infection which is great news. No fevers, chills, nausea, vomiting, or diarrhea. 02/06/2021 upon evaluation today patient appears to be doing well with regard to his wound currently. He has been tolerating the dressing changes without complication. Fortunately there does not appear to be any evidence of active infection locally nor systemically at this time. No fevers, chills, nausea, vomiting, or diarrhea. Overall I think that this is very slow but does seem to be getting a little bit better and a little smaller he has been through a lot of stress recently with his wife she is having a heart evaluation tomorrow to see whether or not an aortic valve replacement is something that she could tolerate and undergo. 04/03/2020 upon evaluation today patient's legs are actually showing signs of significant improvement. This is actually a dramatic overall improvement since I last saw him I think we are getting very close to closing these wounds. That is an amazing thing is we been dealing with this for quite some time. The patient is very pleased to hear things appear to be doing well as well. 05-15-2021 upon evaluation today patient appears to be doing well with regard to his wound on the bilateral lower extremities. Both are showing signs of excellent improvement which is great news. There does not appear to be any signs of active infection locally nor systemically at this time. No fevers, chills, nausea, vomiting, or diarrhea. 06-26-2021 upon evaluation today patient appears to be doing well with regard to his wounds on the legs. They are actually showing signs of improvement in my opinion which is great news. Fortunately I do not see any evidence of active infection locally or systemically which is great news. No fevers,  chills, nausea, vomiting, or diarrhea. 08-21-2021 upon evaluation today patient appears to be making excellent progress. Has been using a mixture of topical antibiotic ointment followed by the ABD pads which does seem to be doing quite well. I do not see any evidence of active infection locally or systemically at this time which is great news. With that being said I think that he is very close to complete resolution. 10-09-2021 upon evaluation today patient appears to be doing excellent in regard to his wound. In fact both legs are actually looking well. He has a lot of new skin very few openings remaining at this point. Fortunately I do not see any signs of active infection locally or systemically at this time. 11-27-2021 upon evaluation today patient appears to be doing well currently in regard to his wounds. He has been tolerating the dressing changes without complication. Fortunately there does not appear to be any signs of active infection at this time which is great news. No fevers, chills, nausea, vomiting, or diarrhea. 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 Objective Constitutional Well-nourished and well-hydrated in no acute distress. Vitals Time Taken: 12:40 PM, Height: 71 in, Weight: 220 lbs, BMI: 30.7, Temperature: 97.8 F, Pulse: 62 bpm, Respiratory Rate: 18 breaths/min, Blood Pressure: 140/72 mmHg. Respiratory normal breathing without difficulty. Psychiatric this patient is able to make decisions and demonstrates good insight into disease process. Alert and Oriented x 3. pleasant  and cooperative. General Notes: Patient's wounds actually are significantly smaller compared to the last time I saw him in a very pleased in that regard. Fortunately I see no signs of infection locally or systemically which is great news. No fevers,  chills, nausea, vomiting, or diarrhea. Integumentary (Hair, Skin) Wound #2 status is Open. Original cause of wound was Gradually Appeared. The date acquired was: 03/13/2017. The wound has been in treatment 217 weeks. The wound is located on the Left,Distal,Lateral Lower Leg. The wound measures 10.6cm length x 7.5cm width x 0.2cm depth; 62.439cm^2 area and 12.488cm^3 volume. There is Fat Layer (Subcutaneous Tissue) exposed. There is no tunneling or undermining noted. There is a medium amount of serosanguineous drainage noted. The wound margin is distinct with the outline attached to the wound base. There is large (67-100%) red granulation within the wound bed. There is a small (1-33%) amount of necrotic tissue within the wound bed including Adherent Slough. The periwound skin appearance exhibited: Dry/Scaly, Hemosiderin Staining. The periwound skin appearance did not exhibit: Erythema. Wound #5 status is Open. Original cause of wound was Gradually Appeared. The date acquired was: 10/26/2017. The wound has been in treatment 213 weeks. The wound is located on the Right,Lateral,Anterior Lower Leg. The wound measures 3cm length x 2.8cm width x 0.1cm depth; 6.597cm^2 area and 0.66cm^3 volume. There is Fat Layer (Subcutaneous Tissue) exposed. There is no tunneling or undermining noted. There is a medium amount of serosanguineous drainage noted. The wound margin is distinct with the outline attached to the wound base. There is large (67-100%) red granulation within the wound bed. There is no necrotic tissue within the wound bed. The periwound skin appearance exhibited: Hemosiderin Staining. The periwound skin appearance did not exhibit: Erythema. 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 Bathing/ Shower/ Hygiene: May shower and wash wound with soap and water. - on days that dressing is changed Edema Control - Lymphedema / SCD / Other: Elevate legs to the level of the heart or above for 30 minutes daily and/or when sitting, a frequency of: - throughout the day Avoid standing for long periods of time. Exercise regularly Moisturize legs daily. Compression stocking or Garment 20-30 mm/Hg pressure to: - Juxtalite to both legs daily WOUND #2: - Lower Leg Wound Laterality: Left, Lateral, Distal Cleanser: Soap and Water 3 x Per Week/30 Days Discharge Instructions: May shower and wash wound with dial antibacterial soap and water prior to dressing change. Peri-Wound Care: Zinc Oxide Ointment 30g tube 3 x Per Week/30 Days Discharge Instructions: Apply Zinc Oxide to periwound with each dressing change as needed for maceration Peri-Wound Care: Sween Lotion (Moisturizing lotion) 3 x Per Week/30 Days Discharge Instructions: Apply moisturizing lotion as directed Topical: bacitracin 3 x Per Week/30 Days Discharge Instructions: apply three times a week. Secondary Dressing: ABD Pad, 5x9 (Generic) 3 x Per Week/30 Days Discharge Instructions: Apply over primary dressing as directed. Secured With: Elastic Bandage 4 inch (ACE bandage) 3 x Per Week/30 Days Discharge Instructions: Secure with ACE bandage as directed. Secured With:  Kerlix Roll Sterile, 4.5x3.1 (in/yd) (Generic) 3 x Per Week/30 Days Discharge Instructions: Secure with Kerlix as directed. WOUND #5: - Lower Leg Wound Laterality: Right, Lateral,  Anterior Cleanser: Soap and Water 3 x Per Week/30 Days Discharge Instructions: May shower and wash wound with dial antibacterial soap and water prior to dressing change. Peri-Wound Care: Zinc Oxide Ointment 30g tube 3 x Per Week/30 Days Discharge Instructions: Apply Zinc Oxide to periwound with each dressing change as needed for maceration Peri-Wound Care: Sween Lotion (Moisturizing lotion) 3 x Per Week/30 Days Discharge Instructions: Apply moisturizing lotion as directed Topical: bacitracin 3 x Per Week/30 Days Discharge Instructions: apply three times a week. Secondary Dressing: ABD Pad, 5x9 (Generic) 3 x Per Week/30 Days Discharge Instructions: Apply over primary dressing as directed. Secured With: Elastic Bandage 4 inch (ACE bandage) 3 x Per Week/30 Days Discharge Instructions: Secure with ACE bandage as directed. Secured With: [employer], 4.5x3.1 (in/yd) (Generic) 3 x Per Week/30 Days Discharge Instructions: Secure with Kerlix as directed. 1. I am going to recommend that we have the patient go ahead and continue to monitor for any signs of worsening or infection. Right now I really feel like we are doing quite well at this time. 2. I would recommend he continue but actually switch from Neosporin to bacitracin ointment for his legs. He will apply this and then the ABD pads followed by the roll gauze and Ace wraps. 3. I am also going to suggest that he continue to monitor for any signs of infection obviously if anything changes he knows to contact the office and let me know. We will see patient back for reevaluation in 2 months here in the clinic. If anything worsens or changes patient will contact our office for additional recommendations. Electronic Signature(s) Signed: 11/27/2021 1:19:10 PM By: Worthy Keeler PA-C Previous Signature: 11/27/2021 1:18:16 PM Version By: Worthy Keeler PA-C Entered By: Worthy Keeler on 11/27/2021  13:19:09 -------------------------------------------------------------------------------- SuperBill Details Patient Name: Date of Service: [name], [place]. 11/27/2021 [name] ([id number]) [id number] Page 12 of 12 Medical Record Number: [id number] Patient Account Number: [id number] Date of Birth/Sex: Treating RN: 03-Dec-1940 (81 y.o. 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.812 Non-pressure chronic ulcer of other part of right lower leg with fat layer exposed I10 Essential (primary) hypertension Facility Procedures : CPT4 Code: 15176160 Description: 99214 - WOUND CARE VISIT-LEV 4 EST PT Modifier: Quantity: 1 Physician Procedures : CPT4 Code Description Modifier [id number] - WC PHYS LEVEL 3 - EST PT ICD-10 Diagnosis Description I87.2 Venous insufficiency (chronic) (peripheral) 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

## 2021-11-28 DIAGNOSIS — Z0289 Encounter for other administrative examinations: Secondary | ICD-10-CM

## 2021-11-29 ENCOUNTER — Other Ambulatory Visit: Payer: Medicare PPO

## 2021-11-29 ENCOUNTER — Encounter: Payer: Self-pay | Admitting: Internal Medicine

## 2021-11-29 ENCOUNTER — Ambulatory Visit: Payer: Medicare PPO | Admitting: Internal Medicine

## 2021-11-29 VITALS — BP 134/78 | HR 65 | Temp 98.2°F | Ht 70.0 in | Wt 187.0 lb

## 2021-11-29 DIAGNOSIS — R931 Abnormal findings on diagnostic imaging of heart and coronary circulation: Secondary | ICD-10-CM

## 2021-11-29 DIAGNOSIS — I10 Essential (primary) hypertension: Secondary | ICD-10-CM | POA: Diagnosis not present

## 2021-11-29 DIAGNOSIS — Z111 Encounter for screening for respiratory tuberculosis: Secondary | ICD-10-CM

## 2021-11-29 DIAGNOSIS — I77810 Thoracic aortic ectasia: Secondary | ICD-10-CM | POA: Diagnosis not present

## 2021-11-29 NOTE — Progress Notes (Signed)
Labs ordered.

## 2021-12-02 DIAGNOSIS — Z0289 Encounter for other administrative examinations: Secondary | ICD-10-CM

## 2021-12-02 NOTE — Progress Notes (Signed)
JAIRO, BELLEW ([id number]) [id number] Page 1 of 9 Visit Report for 11/27/2021 Arrival Information Details Patient Name: Date of Service: Carlos White 11/27/2021 12:45 PM Medical Record Number: [id number] Patient Account Number: [id number] Date of Birth/Sex: Treating RN: [birth year] (81 y.o. Carlos White Primary Care Jorryn Hershberger: [name] Other Clinician: Referring Shankar Silber: Treating Sondos Wolfman/Extender: [name] in Treatment: 217 Visit Information History Since Last Visit Added or deleted any medications: No Patient Arrived: Ambulatory Any new allergies or adverse reactions: No Arrival Time: 12:41 Had a fall or experienced change in No Accompanied By: self activities of daily living that may affect Transfer Assistance: None risk of falls: Patient Identification Verified: Yes Signs or symptoms of abuse/neglect since last visito No Secondary Verification Process Completed: Yes Hospitalized since last visit: No Patient Requires Transmission-Based Precautions: No Implantable device outside of the clinic excluding No Patient Has Alerts: Yes cellular tissue based products placed in the center Patient Alerts: R ABI= 1.23, TBI = .86 since last visit: L ABI= 1.28, TBI=.81 Has Dressing in Place as Prescribed: Yes Has Compression in Place as Prescribed: Yes Pain Present Now: No Electronic Signature(s) Signed: 12/02/2021 3:46:43 PM By: Sharyn Creamer RN, BSN Entered By: Sharyn Creamer on 11/27/2021 12:41:23 -------------------------------------------------------------------------------- Clinic Level of Care Assessment Details Patient Name: Date of Service: Carlos White 11/27/2021 12:45 PM Medical Record Number: [id number] Patient Account Number: [id number] Date of Birth/Sex: Treating RN: [birth year]os White (81 y.o. Burnadette Pop, Lauren Primary Care Alysandra Lobue: [name] Other Clinician: Referring Jacy Brocker: Treating  Luddie Boghosian/Extender: [name] in Treatment: [facility] Level of Care Assessment Items TOOL 4 Quantity Score X- 1 0 Use when only an EandM is performed on FOLLOW-UP visit ASSESSMENTS - Nursing Assessment / Reassessment X- 1 10 Reassessment of Co-morbidities (includes updates in patient status) X- 1 5 Reassessment of Adherence to Treatment Plan ASSESSMENTS - Wound and Skin A ssessment / Reassessment _0  - 0 Simple Wound Assessment / Reassessment - one wound X- 2 5 Complex Wound Assessment / Reassessment - multiple wounds _1  - 0 Dermatologic / Skin Assessment (not related to wound area) ASSESSMENTS - Focused Assessment X- 2 5 Circumferential Edema Measurements - multi extremities _2  - 0 Nutritional Assessment / Counseling / Intervention Carlos White, Carlos White ([id number]) [id number] Page 2 of 9 _3  - 0 Lower Extremity Assessment (monofilament, tuning fork, pulses) _4  - 0 Peripheral Arterial Disease Assessment (using hand held doppler) ASSESSMENTS - Ostomy and/or Continence Assessment and Care _5  - 0 Incontinence Assessment and Management _6  - 0 Ostomy Care Assessment and Management (repouching, etc.) PROCESS - Coordination of Care X - Simple Patient / Family Education for ongoing care 1 15 _7  - 0 Complex (extensive) Patient / Family Education for ongoing care X- 1 10 Staff obtains Programmer, systems, Records, T Results / Process Orders est _8  - 0 Staff telephones HHA, Nursing Homes / Clarify orders / etc _9  - 0 Routine Transfer to another Facility (non-emergent condition) _10  - 0 Routine Hospital Admission (non-emergent condition) _11  - 0 New Admissions / Biomedical engineer / Ordering NPWT Apligraf, etc. , _12  - 0 Emergency Hospital Admission (emergent condition) X- 1 10 Simple Discharge Coordination _13  - 0 Complex (extensive) Discharge Coordination PROCESS - Special Needs _14  - 0 Pediatric / Minor Patient Management _15  -  0 Isolation Patient Management _16  - 0 Hearing / Language / Visual special needs _17  - 0 Assessment of Community assistance (transportation, D/C planning, etc.) _18  - 0 Additional assistance /  Altered mentation _0  - 0 Support Surface(s) Assessment (bed, cushion, seat, etc.) INTERVENTIONS - Wound Cleansing / Measurement _1  - 0 Simple Wound Cleansing - one wound X- 2 5 Complex Wound Cleansing - multiple wounds X- 1 5 Wound Imaging (photographs - any number of wounds) _2  - 0 Wound Tracing (instead of photographs) _3  - 0 Simple Wound Measurement - one wound X- 2 5 Complex Wound Measurement - multiple wounds INTERVENTIONS - Wound Dressings _4  - 0 Small Wound Dressing one or multiple wounds X- 2 15 Medium Wound Dressing one or multiple wounds _5  - 0 Large Wound Dressing one or multiple wounds X- 1 5 Application of Medications - topical <YWVPXTGGYIRSWNIO>_2<\/VOJJKKXFGHWEXHBZ>_1  - 0 Application of Medications - injection INTERVENTIONS - Miscellaneous _7  - 0 External ear exam _8  - 0 Specimen Collection (cultures, biopsies, blood, body fluids, etc.) _9  - 0 Specimen(s) / Culture(s) sent or taken to Lab for analysis _10  - 0 Patient Transfer (multiple staff / Civil Service fast streamer / Similar devices) _11  - 0 Simple Staple / Suture removal (25 or less) _12  - 0 Complex Staple / Suture removal (26 or more) _13  - 0 Hypo / Hyperglycemic Management (close monitor of Blood Glucose) Carlos White, Carlos White ([id number]) [id number] Page 3 of 9 _14  - 0 Ankle / Brachial Index (ABI) - do not check if billed separately X- 1 5 Vital Signs Has the patient been seen at the hospital within the last three years: Yes Total Score: 135 Level Of Care: New/Established - Level 4 Electronic Signature(s) Signed: 11/29/2021 12:09:11 PM By: Rhae Hammock RN Entered By: Rhae Hammock on 11/27/2021 13:10:46 -------------------------------------------------------------------------------- Encounter Discharge Information  Details Patient Name: Date of Service: Carlos White, [name] 11/27/2021 12:45 PM Medical Record Number: [id number] Patient Account Number: [id number] Date of Birth/Sex: Treating RN: Carlos [birth year] (81 y.o. Burnadette Pop, Lauren Primary Care Pleasant Bensinger: [name] Other Clinician: Referring Yashika Mask: Treating Severino Paolo/Extender: [name] in Treatment: 217 Encounter Discharge Information Items Discharge Condition: Stable Ambulatory Status: Ambulatory Discharge Destination: Home Transportation: Private Auto Accompanied By: self Schedule Follow-up Appointment: Yes Clinical Summary of Care: Patient Declined Electronic Signature(s) Signed: 11/29/2021 12:09:11 PM By: Rhae Hammock RN Entered By: Rhae Hammock on 11/27/2021 13:11:20 -------------------------------------------------------------------------------- Lower Extremity Assessment Details Patient Name: Date of Service: Carlos Dull RDO [name] 11/27/2021 12:45 PM Medical Record Number: [id number] Patient Account Number: [id number] Date of Birth/Sex: Treating RN: [birth year] (81 y.o. Carlos White Primary Care Aws Shere: [name] Other Clinician: Referring Ethelean Colla: Treating Kaleen Rochette/Extender: [name] in Treatment: 217 Edema Assessment Assessed: [Left: No] [Right: No] Edema: [Left: Yes] [Right: Yes] Calf Left: Right: Point of Measurement: 35.5 cm From Medial Instep 38.3 cm 38 cm Ankle Left: Right: Point of Measurement: 11 cm From Medial Instep 25 cm 22.8 cm Vascular Assessment Left: [[id number] Page 4 of 9Right:] Pulses: Dorsalis Pedis Palpable: [[id number] Page 4 of 9Yes Yes] Electronic Signature(s) Signed: 12/02/2021 3:46:43 PM By: Sharyn Creamer RN, BSN Entered By: Sharyn Creamer on 11/27/2021 12:49:24 -------------------------------------------------------------------------------- Multi-Disciplinary Care Plan  Details Patient Name: Date of Service: Carlos White, [place]. 11/27/2021 12:45 PM Medical Record Number: [id number] Patient Account Number: [id number] Date of Birth/Sex: Treating RN: Carlos White, Carlos White (81 y.o. Carlos White Primary Care Eean Buss: [name] Other Clinician: Referring Georgia Delsignore: Treating Arvis Miguez/Extender: [name] in Treatment: [place] reviewed with physician Active Inactive Venous Leg Ulcer Nursing Diagnoses: Knowledge deficit related to disease process and management Potential for venous Insuffiency (use before  diagnosis confirmed) Goals: Patient will maintain optimal edema control Date Initiated: 09/30/2017 Target Resolution Date: 12/13/2021 Goal Status: Active Patient/caregiver will verbalize understanding of disease process and disease management Date Initiated: 09/30/2017 Date Inactivated: 11/04/2017 Target Resolution Date: 10/30/2017 Goal Status: Met Interventions: Assess peripheral edema status every visit. Provide education on venous insufficiency Notes: Electronic Signature(s) Signed: 11/29/2021 12:09:11 PM By: Rhae Hammock RN Entered By: Rhae Hammock on 11/27/2021 13:01:35 -------------------------------------------------------------------------------- Pain Assessment Details Patient Name: Date of Service: Carlos Dull RDO [name] 11/27/2021 12:45 PM Medical Record Number: [id number] Patient Account Number: [id number] Date of Birth/Sex: Treating RN: [birth year]os White (81 y.o. Carlos White Primary Care Lataisha Colan: [name] Other Clinician: Referring Makaylen Thieme: Treating Rickell Wiehe/Extender: [name] in Treatment: [address] [place], [place] ([id number]) [id number] Page 5 of 9 Location of Pain Severity and Description of Pain Patient Has Paino No Site Locations Pain Management and Medication Current Pain Management: Electronic  Signature(s) Signed: 12/02/2021 3:46:43 PM By: Sharyn Creamer RN, BSN Entered By: Sharyn Creamer on 11/27/2021 12:41:34 -------------------------------------------------------------------------------- Patient/Caregiver Education Details Patient Name: Date of Service: Carlos White 11/15/2023andnbsp12:45 PM Medical Record Number: [id number] Patient Account Number: [id number] Date of Birth/Gender: Treating RN: [birth year] (81 y.o. Carlos White Primary Care Physician: [name] Other Clinician: Referring Physician: Treating Physician/Extender: [name] in Treatment: 217 Education Assessment Education Provided To: Patient Education Topics Provided Venous: Methods: Explain/Verbal Responses: Reinforcements needed, State content correctly Electronic Signature(s) Signed: 11/29/2021 12:09:11 PM By: Rhae Hammock RN Entered By: Rhae Hammock on 11/27/2021 13:01:47 -------------------------------------------------------------------------------- Wound Assessment Details Patient Name: Date of Service: Carlos White, [place]. 11/27/2021 12:45 PM [name] ([id number]) [id number] Page 6 of 9 Medical Record Number: [id number] Patient Account Number: [id number] Date of Birth/Sex: Treating RN: Carlos White[birth year] (81 y.o. Carlos White Primary Care Weltha Cathy: [name] Other Clinician: Referring Mandel Seiden: Treating Brynda Heick/Extender: [name] in Treatment: 217 Wound Status Wound Number: 2 Primary Venous Leg Ulcer Etiology: Wound Location: Left, Distal, Lateral Lower Leg Wound Status: Open Wounding Event: Gradually Appeared Comorbid Cataracts, Chronic Obstructive Pulmonary Disease (COPD), Date Acquired: 03/13/2017 History: Hypertension Weeks Of Treatment: 217 Clustered Wound: Yes Photos Wound Measurements Length: (cm) Width: (cm) Depth: (cm) Clustered Quantity: Area: (cm) Volume:  (cm) 10.6 % Reduction in Area: 19.5% 7.5 % Reduction in Volume: 67.8% 0.2 Epithelialization: Medium (34-66%) 1 Tunneling: No 62.439 Undermining: No 12.488 Wound Description Classification: Full Thickness Without Exposed Sup Wound Margin: Distinct, outline attached Exudate Amount: Medium Exudate Type: Serosanguineous Exudate Color: red, brown port Structures Foul Odor After Cleansing: No Slough/Fibrino Yes Wound Bed Granulation Amount: Large (67-100%) Exposed Structure Granulation Quality: Red Fascia Exposed: No Necrotic Amount: Small (1-33%) Fat Layer (Subcutaneous Tissue) Exposed: Yes Necrotic Quality: Adherent Slough Tendon Exposed: No Muscle Exposed: No Joint Exposed: No Bone Exposed: No Periwound Skin Texture Texture Color No Abnormalities Noted: No No Abnormalities Noted: No Erythema: No Moisture Hemosiderin Staining: Yes No Abnormalities Noted: No Dry / Scaly: Yes Treatment Notes Wound #2 (Lower Leg) Wound Laterality: Left, Lateral, Distal Cleanser Soap and Water Discharge Instruction: May shower and wash wound with dial antibacterial soap and water prior to dressing change. Peri-Wound Care Zinc Oxide Ointment 30g tube Discharge Instruction: Apply Zinc Oxide to periwound with each dressing change as needed for maceration Sween Lotion (Moisturizing lotion) Discharge Instruction: Apply moisturizing lotion as directed Carlos White, Carlos White ([id number]) [phone].pdf Page 7 of 9 Topical bacitracin Discharge Instruction: apply three times a  week. Primary Dressing Secondary Dressing ABD Pad, 5x9 Discharge Instruction: Apply over primary dressing as directed. Secured With Elastic Bandage 4 inch (ACE bandage) Discharge Instruction: Secure with ACE bandage as directed. Kerlix Roll Sterile, 4.5x3.1 (in/yd) Discharge Instruction: Secure with Kerlix as directed. Compression Wrap Compression Stockings Add-Ons Electronic Signature(s) Signed:  12/02/2021 3:46:43 PM By: Sharyn Creamer RN, BSN Entered By: Sharyn Creamer on 11/27/2021 12:51:34 -------------------------------------------------------------------------------- Wound Assessment Details Patient Name: Date of Service: Carlos Dull RDO [name] 11/27/2021 12:45 PM Medical Record Number: [id number] Patient Account Number: [id number] Date of Birth/Sex: Treating RN: Carlos White[birth year] (81 y.o. Carlos White Primary Care Duc Crocket: [name] Other Clinician: Referring Alezander Dimaano: Treating Vernessa Likes/Extender: [name] in Treatment: 217 Wound Status Wound Number: 5 Primary Vasculopathy Etiology: Wound Location: Right, Lateral, Anterior Lower Leg Wound Status: Open Wounding Event: Gradually Appeared Comorbid Cataracts, Chronic Obstructive Pulmonary Disease (COPD), Date Acquired: 10/26/2017 History: Hypertension Weeks Of Treatment: 213 Clustered Wound: Yes Photos Wound Measurements Length: (cm) Width: (cm) Depth: (cm) Clustered Quantity: Area: (cm) Volume: (cm) 3 % Reduction in Area: 87.8% 2.8 % Reduction in Volume: 87.8% 0.1 Epithelialization: Large (67-100%) 4 Tunneling: No 6.597 Undermining: No 0.66 Wound Description Carlos White, Carlos White ([id number]) Classification: Full Thickness Without Exposed Support Structures Wound Margin: Distinct, outline attached Exudate Amount: Medium Exudate Type: Serosanguineous Exudate Color: red, brown [id number] Page 8 of 9 Foul Odor After Cleansing: No Slough/Fibrino Yes Wound Bed Granulation Amount: Large (67-100%) Exposed Structure Granulation Quality: Red Fascia Exposed: No Necrotic Amount: None Present (0%) Fat Layer (Subcutaneous Tissue) Exposed: Yes Tendon Exposed: No Muscle Exposed: No Joint Exposed: No Bone Exposed: No Periwound Skin Texture Texture Color No Abnormalities Noted: No No Abnormalities Noted: No Erythema: No Moisture Hemosiderin Staining: Yes No  Abnormalities Noted: No Treatment Notes Wound #5 (Lower Leg) Wound Laterality: Right, Lateral, Anterior Cleanser Soap and Water Discharge Instruction: May shower and wash wound with dial antibacterial soap and water prior to dressing change. Peri-Wound Care Zinc Oxide Ointment 30g tube Discharge Instruction: Apply Zinc Oxide to periwound with each dressing change as needed for maceration Sween Lotion (Moisturizing lotion) Discharge Instruction: Apply moisturizing lotion as directed Topical bacitracin Discharge Instruction: apply three times a week. Primary Dressing Secondary Dressing ABD Pad, 5x9 Discharge Instruction: Apply over primary dressing as directed. Secured With Elastic Bandage 4 inch (ACE bandage) Discharge Instruction: Secure with ACE bandage as directed. Kerlix Roll Sterile, 4.5x3.1 (in/yd) Discharge Instruction: Secure with Kerlix as directed. Compression Wrap Compression Stockings Add-Ons Electronic Signature(s) Signed: 12/02/2021 3:46:43 PM By: Sharyn Creamer RN, BSN Entered By: Sharyn Creamer on 11/27/2021 12:52:30 -------------------------------------------------------------------------------- [place] Details Patient Name: Date of Service: Carlos White, [name] 11/27/2021 12:45 PM Medical Record Number: [id number] Patient Account Number: [id number] Date of Birth/Sex: Treating RN: [birth year]Carlos White (81 y.o. Carlos White [place], Aynor H ([id number]) [id number] Page 9 of 9 Primary Care Joeleen Wortley: [name] Other Clinician: Referring Pegeen Stiger: Treating Labrandon Knoch/Extender: [name] in Treatment: 217 Vital Signs Time Taken: 12:40 Temperature (F): 97.8 Height (in): 71 Pulse (bpm): 62 Weight (lbs): 220 Respiratory Rate (breaths/min): 18 Body Mass Index (BMI): 30.7 Blood Pressure (mmHg): 140/72 Reference Range: 80 - 120 mg / dl Electronic Signature(s) Signed: 12/02/2021 3:46:43 PM By: Sharyn Creamer RN,  BSN Entered By: Sharyn Creamer on 11/27/2021 12:43:12

## 2021-12-04 LAB — QUANTIFERON-TB GOLD PLUS
Mitogen-NIL: 9.41 IU/mL
NIL: 0.02 IU/mL
QuantiFERON-TB Gold Plus: NEGATIVE
TB1-NIL: 0.01 IU/mL
TB2-NIL: <0 IU/mL

## 2021-12-07 DIAGNOSIS — I77810 Thoracic aortic ectasia: Secondary | ICD-10-CM | POA: Insufficient documentation

## 2021-12-07 DIAGNOSIS — R931 Abnormal findings on diagnostic imaging of heart and coronary circulation: Secondary | ICD-10-CM | POA: Insufficient documentation

## 2021-12-07 NOTE — Progress Notes (Signed)
Subjective:    Patient ID: [name]., male    DOB: 1940-05-12, 81 y.o.   MRN: [id number]  HPI  81 year old Male seen regarding form completion for [facility]. Wife has been admitted there. He thinks he will eventually move there once their condo sells. He has paperwork that needs completion.  QuantiFERON test is required for this form completion and will be done today.  A Cologuard test was ordered for him in the Fall.  Company reports collection kit was empty and no specimen was received and company will request another kit be sent to patient.  Patient also had CT coronary calcium scoring done on November 9.  He has aortic atherosclerosis and aneurysmal dilation of the ascending thoracic aorta measuring 4 cm.  They are recommending annual imaging for him.  His coronary calcium score was 2280.  His lipid panel was completely normal in September 2023.  I think it would be reasonable to have him on statin medication given his high score.  He does take 81 mg of aspirin daily.  In April 2022 he was admitted with a closed displaced left anterior pubic ramus fracture.  CT showed comminuted fractures involving the left anterior pelvis and acetabulum with involvement of anterior middle and posterior columns of the left acetabulum.  Also comminuted displaced fractures of the inferior left pubic rami.  He was sent to a rehab facility for short period of time but was unhappy there.  He also had a large hematoma around the right hip.  He subsequently went home and had home health care and recovered.  History of hypertension treated with losartan 100 mg daily.  He has BPH treated with Flomax.  He has musculoskeletal pain treated with tramadol sparingly.  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  He manages this very well since he had to deal with it for many years.  History of adenomatous colon polyps status post colonoscopy in 2002 and 2006.  History  of cataract extraction left eye July 2012 and in September 2012 by Dr. Herbert Deaner.  History of low back pain and this is why he takes tramadol sparingly.  History of essential hypertension.  Social history: He is married.  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  He does not smoke.  This is his second marriage.  He has children from first marriage that are adults and live out of town.  Has not consumed alcohol in many years.  Family history: Sister in good health.  Father died with congestive heart failure with history of prostate cancer.  Mother died with history of hypertension and dementia.  Maternal aunt with history of dementia and coronary artery disease.  Maternal uncle with history of coronary artery disease died of an MI.  History of hypertension treated with losartan.  Lipid panel in September 2023 was completely normal off statin medication and always has been normal during the years he has been coming here.    Review of Systems no new complaints.  Denies chest pain or shortness of breath.     Objective:   Physical Exam  Vital signs reviewed.  BMI 26.83, weight 187 pounds, pulse oximetry 97% on room air, blood pressure 134/78 pulse 65 and regular Chest clear to auscultation.  Cardiac exam: Regular rate and rhythm without ectopy.  No lower extremity pitting edema.  Affect thought and judgment appear to be normal.  Assessment & Plan:   Brief exam/office visit for entrance to [facility] when he decides to move.  Wife is already living there as she has had several falls recently and also has history of COPD and history of TAVR.  Patient is medically stable for entrance to this facility.  QuantiFERON test obtained.  Form to be completed.  Will discuss with him results of coronary calcium scoring and chest CT results indicating a four centimeter aneurysmal dilatation of the ascending thoracic aorta.  Also will see if he would like to continue with annual testing  as recommended by radiology.  Will also see if he wants to take statin medication and/or seek Cardiology consultation.  Consider statin therapy.

## 2021-12-07 NOTE — Patient Instructions (Addendum)
Will discuss with patient results of coronary calcium scoring and whether or not he would like to proceed with cardiology evaluation and statin medication.  Form completion for admission to [facility] and QuantiFERON testing done.

## 2021-12-10 ENCOUNTER — Telehealth: Payer: Self-pay | Admitting: Internal Medicine

## 2021-12-10 NOTE — Telephone Encounter (Signed)
Ramondo Bacchi [phone]  Seaver dropped off a form from [facility] on 11/24/2021 that he needed filled out so he can move into there to be close to [place]. We have completed form, his labs have come back and I have now faxed back to [facility] [phone]. Completed Form  [facility] Medical Form  Demographics Medication List Immunization Summary H&P Property Disposition Quantiferon-TB Gold Plus Lab Results

## 2021-12-10 NOTE — Telephone Encounter (Signed)
Called and let Carlos White know we had faxed all forms.    This message was sent via [place], a product from [employer] [email or web address].[email or web address]/                    -------Fax Transmission Report-------  To:               Recipient at [phone] Subject:          FW: Hp Scans Result:           The transmission was successful. Explanation:      All Pages Ok Pages Sent:       12 Connect Time:     6 minutes, 12 seconds Transmit Time:    12/09/2021 11:00 Transfer Rate:    14400 Status Code:      0000 Retry Count:      0 Job Id:           [id number] Unique Id:        [id number] Fax Line:         11 Fax Server:       [employer]

## 2021-12-13 ENCOUNTER — Other Ambulatory Visit: Payer: Self-pay | Admitting: Internal Medicine

## 2021-12-27 DIAGNOSIS — C44319 Basal cell carcinoma of skin of other parts of face: Secondary | ICD-10-CM | POA: Diagnosis not present

## 2021-12-27 DIAGNOSIS — S01412A Laceration without foreign body of left cheek and temporomandibular area, initial encounter: Secondary | ICD-10-CM | POA: Diagnosis not present

## 2021-12-27 DIAGNOSIS — C44311 Basal cell carcinoma of skin of nose: Secondary | ICD-10-CM | POA: Diagnosis not present

## 2022-01-09 ENCOUNTER — Telehealth: Payer: Self-pay | Admitting: Internal Medicine

## 2022-01-09 NOTE — Telephone Encounter (Addendum)
[name] [phone]  Carlos White called to say he had breakfast yesterday morning with a friend, that called him later and was diagnosed with COVID-19. Carlos White currently has no symptoms and I let him know he needs to keep an eye out for symptoms like scratchy throat, runny nose, sneezing, cough for 5-10 days. He wanted to know if he should wear a mask, I suggested he quarantine for 5-10 days but if he had to go out he wear a mask and especially if he was going to see Denice Paradise and to call if he needed [place]. I also told him he could pick up some home COVID test at the drug store.

## 2022-01-22 ENCOUNTER — Encounter (HOSPITAL_BASED_OUTPATIENT_CLINIC_OR_DEPARTMENT_OTHER): Payer: Medicare PPO | Attending: Physician Assistant | Admitting: Physician Assistant

## 2022-01-22 DIAGNOSIS — I10 Essential (primary) hypertension: Secondary | ICD-10-CM | POA: Insufficient documentation

## 2022-01-22 DIAGNOSIS — L97822 Non-pressure chronic ulcer of other part of left lower leg with fat layer exposed: Secondary | ICD-10-CM | POA: Diagnosis not present

## 2022-01-22 DIAGNOSIS — I872 Venous insufficiency (chronic) (peripheral): Secondary | ICD-10-CM | POA: Diagnosis not present

## 2022-01-22 DIAGNOSIS — L97812 Non-pressure chronic ulcer of other part of right lower leg with fat layer exposed: Secondary | ICD-10-CM | POA: Insufficient documentation

## 2022-01-22 DIAGNOSIS — Q819 Epidermolysis bullosa, unspecified: Secondary | ICD-10-CM | POA: Diagnosis not present

## 2022-01-22 DIAGNOSIS — L97322 Non-pressure chronic ulcer of left ankle with fat layer exposed: Secondary | ICD-10-CM | POA: Insufficient documentation

## 2022-01-22 NOTE — Progress Notes (Signed)
DEAGO, BURRUSS ([id number]) [id number] Page 1 of 13 Visit Report for 01/22/2022 Chief Complaint Document Details Patient Name: Date of Service: Carlos White 01/22/2022 12:30 PM Medical Record Number: [id number] Patient Account Number: [id number] Date of Birth/Sex: Treating RN: Apr 19, 1940 (82 y.o. M) Primary Care Provider: [name] Other Clinician: Referring Provider: Treating Provider/Extender: [name] in Treatment: 225 Information Obtained from: Patient Chief Complaint Bilateral LE Ulcers Electronic Signature(s) Signed: 01/22/2022 12:57:21 PM By: Worthy Keeler PA-C Entered By: Worthy Keeler on 01/22/2022 12:57:21 -------------------------------------------------------------------------------- Debridement Details Patient Name: Date of Service: [name] 01/22/2022 12:30 PM Medical Record Number: [id number] Patient Account Number: [id number] Date of Birth/Sex: Treating RN: [birth year] (82 y.o. [name] Primary Care Provider: [name] Other Clinician: Referring Provider: Treating Provider/Extender: [name] in Treatment: 225 Debridement Performed for Assessment: Wound #19 Left,Medial Ankle Performed By: Clinician [name], RN Debridement Type: Chemical/Enzymatic/Mechanical Agent Used: Gauze, Wound Cleaner Severity of Tissue Pre Debridement: Fat layer exposed Level of Consciousness (Pre-procedure): Awake and Alert Pre-procedure Verification/Time Out No Taken: Bleeding: None Response to Treatment: Procedure was tolerated well Level of Consciousness (Post- Awake and Alert procedure): Post Debridement Measurements of Total Wound Length: (cm) 4.5 Width: (cm) 1.5 Depth: (cm) 0.1 Volume: (cm) 0.53 Character of Wound/Ulcer Post Debridement: Stable Severity of Tissue Post Debridement: Fat layer exposed Post Procedure Diagnosis Same as Pre-procedure Electronic  Signature(s) Unsigned Entered By: [name] on 01/22/2022 12:58:56 Signature(s): [name] ([id number]) 1225 Date(s): [id number] Page 2 of 13 -------------------------------------------------------------------------------- HPI Details Patient Name: Date of Service: Carlos White 01/22/2022 12:30 PM Medical Record Number: [id number] Patient Account Number: [id number] Date of Birth/Sex: Treating RN: [birth year] (82 y.o. M) Primary Care Provider: [name] Other Clinician: Referring Provider: Treating Provider/Extender: [name] in Treatment: 225 History of Present Illness HPI Description: 09/30/17 on evaluation today patient presents for initial evaluation and our clinic concerning issues that he has been having with his bilateral lower extremities. He states this has been going on for quite some time at least six months. Currently his regiment has been mainly cleaning the area with peroxide, applying the is foreign ointment, and wrapping the area with ABD pads and then an ace wrap loosely. He has dealt with issues of this nature he tells me for quite some time. He does have a history of having had a compound fracture of the left lower extremity which he thinks also makes this a much more difficult area for him to heal. He's previously been told that he had poor vascular flow but this was years ago at [facility] we do not have any of those records at this time. He has a history of Epidermolysis Bullosa which was diagnosed around age 57 and he has been cared for at [facility] since that time. Subsequently he states this is hereditary and two of his children one male and one male also have this as well is one of his grandchildren that he is aware of. He has no evidence of infection necessarily at this point although he does have some necrotic tissue noted on the surface of the wound as far as the largest, left lateral lower extremity ulcer,  is concerned. Overall I feel like all things considered he's been taking care of this very well. Obviously he has some fairly significant issues going on at this point in this regard. He does have a  history otherwise of hypertension though for the most part other than the compound fracture of the left leg he seems to have been fairly healthy in my pinion. 10/07/17 on evaluation today patient actually appears to be doing better in regard to his bilateral lower extremity ulcers. With that being said he does still have some evidence of slough noted on the surface of the wounds I think the Iodoflex has been beneficial for him. His arterial studies are scheduled for October 2. With that being said I do believe that he is continuing to show signs of good improvement which is at least good news. 10/14/17 on evaluation today patient appears to be doing very well in regard to his lower extremity ulcers. He's definitely made some progress as far as healing is concerned although there still are several open areas that are going to need to be addressed. He did have his arterial study today which fortunately shows good findings with a right ABI of 1.23 with a TBI of 0.86 in the left ABI of 1.28 with a TBI of 0.81. This is good news and will allow [place] to perform debridement as well. 10/23/2017; patient with a large wound on the left lateral calf, sizable area on the left medial malleolus and an area on the right lateral malleolus. He has a new blister consistent with his underlying blistering skin disease just above this area we have been using Iodoflex on the lateral left calf lateral right ankle and collagen on the medial left ankle. We have been using Kerlix Coban wraps 10/28/17 on evaluation today the patient continues to have signs of improvement in regard to the overall appearance of the original wound. Unfortunately he did have some blistering over the right lateral lower extremity which has appeared to rupture  on evaluation today and likely some of the dead tissue on the surface needs to be cleaned away the good news is this does not appear to be to significantly deep at this time. 11/04/17 on evaluation today patient actually appears to be doing a little better in regard to his lower extremity ulcers. He has been tolerating the dressing changes without complication. With that being said he does still have a significant wound especially over the left lateral lower extremity unfortunately. All of the wounds pretty much are going to require sharp debridement today. 11/11/17 on evaluation today patient appears to be doing more poorly in regard to his left lower extremity in particular. There does not appear to be any evidence of systemic infection although the wound itself as far as the larger left lateral lower extremity ulcer actually appears to be infected in my pinion. There's an older and the surface of the wound is dramatically worsened compared to last week. No fevers, chills, nausea, or vomiting noted at this time. 11/18/17 upon evaluation today patient actually appears to be doing better. I did review his culture today which really did not show any specific organism is a positive reason for his wound decline. There are multiple organisms present not predominant. Nonetheless he seems to be tolerate the doxycycline well in his wounds in general do seem to be doing better. Fortunately there does not appear to be any evidence of infection at this time which is good news. Overall I'm very pleased with how things appear. Nonetheless he still has a lot of healing to go. I do think he could benefit from a Juxta-Lite wrap. 11/25/17 on evaluation today patient actually appears to be doing fairly well in regard  to his wounds. He is still taking the antibiotics he has a few days left. Fortunately this seems to have been excellent for him as far as getting the infection control and very happy in this regard. With  that being said the patient likewise is also very pleased with how things appear at this time in comparison to where we were he's not having as much pain. 12/02/17 Seen today for follow p and management of LLE wounds. Wounds appear to show some improvement. He denies pain, fever, or chills. Completed a course of doxycycline earlier this month. Scheduled to received Juxta-Lite wrap this week. No s/s of infections. 12/09/17 on evaluation today patient actually appears to be doing a little bit better in regard to his wounds. This is obscene very slow process and unfortunately he has a couple of new areas and this is due to the Epidermolysis Bullosa. Nonetheless I am concerned about the fact that he seems to be getting more areas not less that is the reason we're gonna work on getting schedule for the vascular referral to see the venous specialist. 12/23/17 upon evaluation today patient's wounds currently shows evidence of still not doing quite as well is what I would like to have seen. Subsequently the patient did have his venous study which showed evidence of venous stasis. Subsequently I do think that a vascular evaluation for consideration of venous intervention would be appropriate. I'm not necessarily suggesting that will be anything that can be done but I think it is at least a good idea. He is in agreement with this plan. 12/30/17 on evaluation today patient actually appears to be doing very well in regard to his wounds when compared to previous evaluation. Subsequently we have been using the [facility] Dressing which actually appears to have done excellent on his left lateral lower extremity ulcer. The quality of the wound surface is dramatically improved. There is some slight debridement that is going to be required at a couple of locations but overall I'm extremely pleased with how things appear here. 01/07/2018; this is a patient with a primary skin disorder epidermolyis bullosa. Is a  large wound on the left lateral calf and smaller wounds on the right however there is a new wound on the right mid tibia area that occurred within the compression wrap that he did not change. We have been using Hydrofera Blue. On the left he is using Hydrofera Blue and Santyl to the inferior part of the wound and changing the dressing himself. 01/15/2018; primary skin disorder epidermolysis bullosa. He has several difficult wounds including the left lateral calf, smaller wounds on the left medial calf and the right lateral calf. The major area on the left lateral calf has a smaller area inferiorly that has necrotic debris we have been using Santyl to this. The rest of ROSHAUN, POUND ([id number]) [id number] Page 3 of 13 the wounds we have been using Hydrofera Blue. The area on the left calf actually looks larger this week. Uncontrolled edema several small open areas above it that are superficial 01/20/18 on evaluation today patient appears to be doing better as compared to last week in regard to his wounds of the bilateral lower extremities. He tolerated the bilateral compression wrap without complication. Overall I'm very pleased with how things appear at this time. The patient likewise is very happy. 01/27/18 on evaluation today patient appears to be doing decently well in regard to his bilateral lower extremity ulcers. He's been tolerating the dressing changes without  complication. One issue he had is that he did have more drainage to the left leg wrapped last week. He states in fact he probably should come in and let us change it on Friday however he just left it in place and kept adding extra absorption with ABD pads to the external portion of the wrap. Unfortunately he does have some aspiration type breakdown nothing significant but I do believe that this was probably counterproductive in general. Nonetheless his wounds do not appear to be terrible overall. 02/03/18 on  evaluation today patient appears to be doing rather well in regard to his lower extremity ulcers. He has been tolerating the dressing changes without complication. He does tell me that he had to change the wrap on the left one since we last saw him. Subsequently I do not see any evidence of infection I do feel like the food was much better controlled at this point. 02/10/18 on evaluation today patient appears to be doing rather well in regard to his ulcers. He still has significant alterations especially on the left lateral lower extremity. Fortunately there's no signs of infection at this time. Overall I feel like he is making good progress are some areas that I'm gonna attempt some debridement today. 02/17/18 on evaluation today patient appears to be doing okay in regard to his lower Trinity ulcer. It does appear on both locations he has a little bit of drainage causing some breakdown in maceration around the wound bed's although it doesn't appear to be too bad the right is a little bit worse than left. Fortunately there's no signs of infection which is good news. No fevers, chills, nausea, or vomiting noted at this time. 03/10/18 on evaluation today patient actually appears to be doing rather poorly in regard to his bilateral lower extremity ulcers. The right in particular is draining profusely and the wound is actually enlarging which is not good. I'm concerned about both possibly infection and the fact that there's a lot of moisture which is causing breakdown as well. Unfortunately the patient has been trying to change this at home I'm afraid he may need to change more frequently in order to see the improvement that were looking for. There's no signs of systemic infection. 03/17/18 patient actually appears to be doing significantly better at this point in regard to his bilateral lower extremity ulcers. Fortunately there's no signs of infection. That is worsening infection at least indefinitely nothing  systemic. With that being said he is having a lot of drainage though not quite as much is there in his last evaluation. Overall feel like he's on the side of improvement. I think if his results back from his culture which showed that he had a positive group B strep along with abundant Pseudomonas noted on the culture. For that reason I am gonna have him continue with the linezolid as we previously have ordered for him and I did go ahead as well today and prescribe Levaquin as well in order to treat the Pseudomonas portion of the infection noted. 03/24/18 on evaluation today patient actually appears to be doing very well in regard to his lower Trinity ulcer. He's been tolerating the dressing changes without complication. Fortunately both legs show signs of less drainage in his edema is very well controlled at this point as well. Overall very pleased with how things seem to be progressing. 03/31/18 on evaluation today patient actually appears to be doing excellent in regard to his bilateral lower extremity ulcers. These are not  draining nearly as significant as what they were in the past overall seem to be shown signs of excellent improvement which is great news. Fortunately there is no sign of active infection at this time I do believe that the Levaquin has done extremely well for him in this regard. The patient continues to change these at home typically every day. We may be able to slowly work towards every other day changes since the drainage seems to be slowing down quite significantly. 04/14/18 on evaluation today patient appears to be doing well in regard to his bilateral lower extremities. Let me Hilda Blades Almost completely healed which is excellent news. Fortunately he's shown signs of improvement all other sites as well with new skin growth there's some slight hyper granular tissue but for the most part this seems to be well maintained with the [facility] Dressing. I'm very happy in this  regard. 04/28/18 on evaluation today patient appears to be doing rather well in regard to his ulcers of the bilateral lower extremities all things considering. He continues to make some progress as far as new skin growth. There still some hyper granulation noted at this point despite the use of the [facility] Dressing. This is not terrible but I think we may want to consider conclude cauterization today with silver nitrate to try to help knock some of his back as well as helping with any biofilm on the surface of the wound. 05/12/18 on evaluation today patient's wounds actually appear to be doing fairly well in regard to the bilateral lower extremities. He's been tolerating the dressing changes without complication. Fortunately there's no signs of active infection at this time which is good news. Overall very pleased with how things seem to be progressing. You select silver nitrate was beneficial for him. 05/26/18 on evaluation today patient appears to be doing better in regard to left lower extremity and a little bit worse in regard to the right lower extremity. He states that he was pulling off the [facility] Dressing peel back some of the skin making this area significantly larger than what it was previous. He's not had any issues other than this and states even that hasn't caused any pain he just seems to obviously have a much larger area on the right when compared to the previous time I saw him. No fevers, chills, nausea, or vomiting noted at this time. 06/16/18 on evaluation today patient actually appears to be doing a little better in my pinion in regard to his lower summary ulcers. He has new skin islands that seem to be spreading which is good news. Fortunately there's no signs of active infection at this time. His biggest issue is he tells me that coming as often as he does is becoming very cost prohibitive. He wonders if we can potentially spread this out. 07/14/18 on evaluation today  patient appears to be doing a little bit worse in regard to his lower from the ulcer. Unfortunately he still continues to have a significant amount of drainage I think we need to do something to try to help this more. He is still somewhat reluctant to go the route of the Wound VAC although that may be the most appropriate thing for him. No fevers, chills, nausea, or vomiting noted at this time. 08/18/2018 on evaluation today patient actually appears to be doing quite well with regard to his bilateral lower extremity ulcers. I do feel like that currently he is making great progress the care max does seem  to be doing a great job at helping to control the moisture he has no maceration or skin breakdown. Again this seems to be an excellent way to go. 1 thing we may want to change is adding collagen to the base of the wound and then the care max over top he is not opposed to this. 09/15/2018 on evaluation today patient appears to be doing well with regard to his bilateral lower extremity ulcers. He is showing some signs of improvement not necessarily in size but definitely in appearance. In fact he has a lot of new skin growing throughout the wounds along the edges as well as in the central portion of the wounds on both lower extremities. Overall I am extremely pleased to see this. 10/20/2018 on evaluation today patient actually appears to be doing quite well with regard to his wounds. They are not measuring significantly smaller but he does have a lot of new epithelization noted as compared to previous. Fortunately there is no signs of active infection at this time. No fevers, chills, nausea, vomiting, or diarrhea. 11/17/2018 on evaluation today patient presents for reevaluation concerning his bilateral lower extremity ulcers. Fortunately there is no signs of active infection at this time today. He has been tolerating the dressing changes without complication. No fevers, chills, nausea, vomiting, or diarrhea.  Unfortunately in general the patient has not made as much improvement as I would like to have seen up to this point. He has been tolerating the dressing changes without complication and he does an excellent job taking care of his wounds at home in my opinion. The biggest issue I see is that he is just not making the progress that we need to be seeing currently. I think we may want to consider having him seen at a plastic surgery appointment and he has previously seen someone in years past at [facility] in [place]. That is definitely a possibility for [place] to look into at this point. 12/29/2018 on evaluation today patient appears to be doing better in regard to the overall visual appearance of his wounds which do not appear to be as macerated. He does have a much larger skin island in the middle of the left lower extremity ulcer which is doing much better. He tells me the pain is also JAMERSON, VONBARGEN ([id number]) [id number] Page 4 of 13 significantly better. With that being said overall his improvement as far as the size of the wounds is not better but again these are very irregular in change shape quite often. Fortunately there is no evidence of active infection at this time which is great news. He never heard anything from [facility] regarding the plastic surgery referral that we made to them. 01/26/2019 upon evaluation today patient appears to be doing a little better in regard to his wounds today. He has been tolerating the dressing changes again he performs these for the most part on his own. He does a great job wrapping his legs in my opinion. Unfortunately he has not been able to get down to [facility] to see if there is anything from a plastic surgery standpoint that could be done to help with his legs simply due to the fact that his wife unfortunately sustained a compression fracture in her spine she is seeing Dr. Saintclair Halsted and subsequently is going to be  having what sounds to be a kyphoplasty type procedure. With that being said that has not been scheduled yet there is still waiting on an MRI the  patient is very busy in fact overly busy trying to help take care of his wife at this point. I completely understand this is more of a strain on him at this time 02/23/2019 upon evaluation today patient actually appears to be making some progress. I am actually very pleased with the overall appearance of his wounds even compared to last evaluation. He seems to be doing quite well. He is taking care of his wife unfortunately she did have a compression fracture she has had the procedure for this but still she has a slow road to recovery. For that reason he still not gone to [facility] for a second opinion in this regard. Obviously the goal there was if there was anything that can be done from a skin graft standpoint or otherwise. 03/23/2019 upon evaluation today patient continues to have issues with lower extremity ulcers. Since the beginning he has made progress but at the same time the wounds unfortunately just will not close. We have been trying to get the patient to [facility] to see a specialist there but unfortunately with the everything going on with his wife he has not been able to make that appointment time yet he states he may be able to in the next 1-2 months but is not really sure. 04/27/2019 on evaluation today patient appears to be doing a little bit more poorly. His last evaluation. He appears to have some erythema around the edges of the wound at this point. Fortunately there is no signs of active infection at this time which is good news. No fevers, chills, nausea, vomiting, or diarrhea. 06/08/2019 on evaluation today patient appears to be doing well with regard to his wounds. Overall they are actually measuring smaller compared to the last visit last month. We did treat him for Pseudomonas as well as methicillin-resistant Staph  aureus. He was only on the treatment for MRSA however for 7 days as the Cipro was resistant and subsequently we had to place him on doxycycline. Nonetheless I am thinking that we may want to add the doxycycline and just do a month-long treatment considering the longstanding nature of his wounds and see if we get this under better control. 07/13/2019 upon evaluation today patient appears to be doing fairly well in regard to his bilateral lower extremities. There does not appear to be any signs of active infection which is good news. No fevers, chills, nausea, vomiting, or diarrhea. 08/10/2019 upon evaluation today patient appears to be doing about the same with regard to his wounds in general. Unfortunately he is not significantly better although is also not significantly worse which is great news there is no evidence of active infection at this time which is good news. He still dealing with a lot going on with his wife and therefore is not really able to go see anyone at the specialty clinic at [facility] that we have previously set up still. 09/07/2019 on evaluation today patient appears to be doing well with regard to his wounds. In fact this is probably the best that have seen so far in quite a few months. Overall I am very pleased with where things stand at this time. No fevers, chills, nausea, vomiting, or diarrhea. 10/05/2019 upon evaluation today patient appears to be doing more poorly in regard to his legs at this point. He actually is showing some signs of infection. This has been something that we seem to be back-and-forth with as far as trying to keep these wounds from  becoming infected. He takes care of them very well in my opinion but nonetheless I am concerned in this regard. He tells me that his wife is still doing really about the same she is slowly getting better. Nonetheless he still spends the majority of his time helping to take care of her. 11/02/2019 upon evaluation today patient  actually appears to be doing somewhat better in regard to his legs. I do believe that the compounded antibiotic treatment from [facility] has been beneficial for him. Overall I am extremely pleased with where things stand today. There is no signs of active infection at this time. The patient states he has much less drainage than he has in the past. 12/07/2019 upon evaluation today patient appears to be doing well at this time in regard to the overall appearance of his wound bed. Currently there is no signs of active infection at this time. 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 Especially when he stressed. Fortunately there is no signs of active infection locally or systemically at this point. 01/18/2020 on evaluation today patient appears to be doing well with regard to his leg ulcers. He has been tolerating the dressing changes without complication. Fortunately there is no signs of infection and overall I feel like his legs are doing about the best they have done in quite some time. There does not appear to be any evidence of active infection which is great news and overall very pleased. 02/29/2020 upon evaluation today patient's wounds actually appear to be doing quite well currently. There is no sign of active infection at this time. No fevers, chills, nausea, vomiting, or diarrhea. 04/11/2025 on evaluation today patient appears to be doing excellent in regard to his wounds on the legs to be honest. He has made a lot of progress there does not appear to be any signs of active infection and overall I think that he is better than previous although still the wounds are quite significant obviously. In general I think that he is pleased with where things stand at this point. Still there is quite a bit of work to do here. 07/13/2020 upon  evaluation today patient appears to be doing well with regard to his wound. Is been tolerating the dressing changes without complication on both legs. He just has the 2 main wounds on each leg at the ankle on the medial region of the right leg is completely healed. His wounds do seem to have gotten better during the time that he has been on bedrest as result of the pelvis fracture. Obviously does not the way that we wanted to see things improved but nonetheless he tells me that it is what it is. Fortunately he does seem to be doing better he tells me that her daughter from the [place] is actually down helping to take care of them out as he and his wife while he is recovering. 08/08/2020 upon evaluation today patient appears to be doing well currently in regard to his leg ulcers. He has been tolerating the dressing changes without complication. Fortunately there is no signs of active infection at this time. No fevers, chills, nausea, vomiting, or diarrhea. 09/26/2020 upon evaluation today patient appears to be doing well with regard to his wound. He has been showing signs of good improvement which is great news and  overall I am extremely pleased with where things stand today. I do think that he does well with his dressing changes and overall has really improved significantly here. segment how that how she doing 11/14/2020 upon evaluation today patient appears to be doing well with regard to his leg ulcers. He has been tolerating the dressing changes without complication. Fortunately there does not appear to be any signs of active infection at this time. No fevers, chills, nausea, vomiting, or diarrhea. 12/26/2020 upon evaluation today patient actually appears to be making some good progress here in regard to his wounds. Fortunately I do not see any signs of active infection which is great news. No fevers, chills, nausea, vomiting, or diarrhea. 02/06/2021 upon evaluation today patient appears to be doing well  with regard to his wound currently. He has been tolerating the dressing changes without complication. Fortunately there does not appear to be any evidence of active infection locally nor systemically at this time. No fevers, chills, nausea, vomiting, or diarrhea. Overall I think that this is very slow but does seem to be getting a little bit better and a little smaller he has been through a lot of stress recently with his wife she is having a heart evaluation tomorrow to see whether or not an aortic valve replacement is something that she could tolerate and undergo. 04/03/2020 upon evaluation today patient's legs are actually showing signs of significant improvement. This is actually a dramatic overall improvement since I last saw him I think we are getting very close to closing these wounds. That is an amazing thing is we been dealing with this for quite some time. The patient is very pleased to hear things appear to be doing well as well. YAZAN, GATLING ([id number]) [id number] Page 5 of 13 05-15-2021 upon evaluation today patient appears to be doing well with regard to his wound on the bilateral lower extremities. Both are showing signs of excellent improvement which is great news. There does not appear to be any signs of active infection locally nor systemically at this time. No fevers, chills, nausea, vomiting, or diarrhea. 06-26-2021 upon evaluation today patient appears to be doing well with regard to his wounds on the legs. They are actually showing signs of improvement in my opinion which is great news. Fortunately I do not see any evidence of active infection locally or systemically which is great news. No fevers, chills, nausea, vomiting, or diarrhea. 08-21-2021 upon evaluation today patient appears to be making excellent progress. Has been using a mixture of topical antibiotic ointment followed by the ABD pads which does seem to be doing quite well. I do not see any  evidence of active infection locally or systemically at this time which is great news. With that being said I think that he is very close to complete resolution. 10-09-2021 upon evaluation today patient appears to be doing excellent in regard to his wound. In fact both legs are actually looking well. He has a lot of new skin very few openings remaining at this point. Fortunately I do not see any signs of active infection locally or systemically at this time. 11-27-2021 upon evaluation today patient appears to be doing well currently in regard to his wounds. He has been tolerating the dressing changes without complication. Fortunately there does not appear to be any signs of active infection at this time which is great news. No fevers, chills, nausea, vomiting, or diarrhea. 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 01-22-2022 upon evaluation today patient appears to be doing well currently in regard to his wounds he is actually showing signs of improvement compared to where he has been previous. Fortunately I do not see any signs of active infection locally nor systemically which is great news. No fevers, chills, nausea, vomiting, or diarrhea. Electronic Signature(s) Signed: 01/22/2022 2:05:27 PM By: Worthy Keeler PA-C Entered By: Worthy Keeler on 01/22/2022 14:05:27 -------------------------------------------------------------------------------- Physical Exam Details Patient Name: Date of Service: Carlos White 01/22/2022 12:30 PM Medical Record Number: [id number] Patient Account Number: [id number] Date of Birth/Sex: Treating RN: 05-01-1940 (82 y.o. M) Primary Care Provider: [name] Other Clinician: Referring Provider: Treating Provider/Extender: [name] in Treatment:  42 Constitutional Well-nourished and well-hydrated in no acute distress. Respiratory normal breathing without difficulty. Psychiatric this patient is able to make decisions and demonstrates good insight into disease process. Alert and Oriented x 3. pleasant and cooperative. Notes Upon inspection patient did not require any sharp debridement today. 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 He has some areas that pop up. There is far as new areas of blistering are concerned but again this has really been minimal compared to where we have been previous. I think that he is on the right track which is great news. Electronic Signature(s) Signed: 01/22/2022 2:05:43 PM By: Worthy Keeler PA-C Entered By: Worthy Keeler on 01/22/2022 14:05:42 -------------------------------------------------------------------------------- Physician Orders Details Patient Name: Date of Service: [name] 01/22/2022 12:30 PM Medical Record Number: [id number] Patient Account Number: [id number] Date of Birth/Sex: Treating RN: 1940-09-04 (82 y.o. 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 Bathing/ Shower/ Hygiene May shower and wash wound with soap and water. - on days that dressing is changed Edema Control - Lymphedema / SCD / Other Bilateral Lower Extremities Avoid standing for long periods of time. Exercise regularly Moisturize legs daily. Compression stocking or Garment 20-30 mm/Hg pressure to: - Juxtalite to both legs daily Wound Treatment Wound #19 - Ankle Wound Laterality: Left, Medial Cleanser: Soap and Water 3 x Per Week/30 Days Discharge Instructions: May shower and wash wound with dial antibacterial soap and water prior to dressing change. Peri-Wound Care: Zinc Oxide Ointment 30g tube 3 x Per Week/30 Days Discharge Instructions: Apply Zinc Oxide to periwound with each dressing change as needed for maceration Peri-Wound Care: Sween Lotion (Moisturizing lotion) 3 x Per Week/30 Days Discharge Instructions: Apply moisturizing lotion as directed Topical: bacitracin 3 x Per Week/30 Days Discharge Instructions: apply three times a week. Secondary Dressing: ABD Pad, 5x9 (Generic) 3  x Per Week/30 Days Discharge Instructions: Apply over primary dressing as directed. Secured With: [employer], 4.5x3.1 (in/yd) (Generic) 3 x Per Week/30 Days Discharge Instructions: Secure with Kerlix as directed. Compression Wrap: tubigrip D one layer 3 x Per Week/30 Days Discharge Instructions: apply over the dressing from base of toes to just below the knee. Wound #2 - Lower Leg Wound Laterality: Left, Lateral, Distal Cleanser: Soap and Water 3 x Per Week/30 Days Discharge Instructions: May shower and wash wound with dial antibacterial soap and water prior to dressing change. Peri-Wound Care: Zinc Oxide Ointment 30g tube 3 x Per Week/30 Days Discharge Instructions: Apply Zinc Oxide to periwound with each dressing change as needed for maceration Peri-Wound Care: Sween Lotion (Moisturizing lotion)  3 x Per Week/30 Days Discharge Instructions: Apply moisturizing lotion as directed Topical: bacitracin 3 x Per Week/30 Days Discharge Instructions: apply three times a week. Secondary Dressing: ABD Pad, 5x9 (Generic) 3 x Per Week/30 Days Discharge Instructions: Apply over primary dressing as directed. Secured With: [employer], 4.5x3.1 (in/yd) (Generic) 3 x Per Week/30 Days Discharge Instructions: Secure with Kerlix as directed. Compression Wrap: tubigrip D one layer 3 x Per Week/30 Days Discharge Instructions: apply over the dressing from base of toes to just below the knee. DELMONT, PROSCH ([id number]) [id number] Page 7 of 13 Wound #5 - Lower Leg Wound Laterality: Right, Lateral, Anterior Cleanser: Soap and Water 3 x Per Week/30 Days Discharge Instructions: May shower and wash wound with dial antibacterial soap and water prior to dressing change. Peri-Wound Care: Zinc Oxide Ointment 30g tube 3 x Per Week/30 Days Discharge Instructions: Apply Zinc Oxide to periwound with each dressing change as needed for maceration Peri-Wound Care: Sween Lotion (Moisturizing lotion) 3 x Per Week/30 Days Discharge Instructions: Apply moisturizing lotion as directed Topical: bacitracin 3 x Per Week/30 Days Discharge Instructions: apply three times a week. Secondary Dressing: ABD Pad, 5x9 (Generic) 3 x Per Week/30 Days Discharge Instructions: Apply over primary dressing as directed. Secured With: [employer], 4.5x3.1 (in/yd) (Generic) 3 x Per Week/30 Days Discharge Instructions: Secure with Kerlix as directed. Compression Wrap: tubigrip D one layer 3 x Per Week/30 Days Discharge Instructions: apply over the dressing from base of toes to just below the knee. Electronic Signature(s) Unsigned Entered By: [name] on 01/22/2022 13:03:52 -------------------------------------------------------------------------------- Problem List Details Patient Name: Date of  Service: Carlos White 01/22/2022 12:30 PM Medical Record Number: [id number] Patient Account Number: [id number] Date of Birth/Sex: Treating RN: 26-Jun-1940 (82 y.o. 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 09/30/2017 No Yes L97.322 Non-pressure chronic ulcer of left ankle with fat layer exposed 09/30/2017 No Yes L97.822 Non-pressure chronic ulcer of other part of left lower leg with fat layer exposed9/18/2019 No Yes L97.812 Non-pressure chronic ulcer of other part of right lower leg with fat layer 09/30/2017 No Yes exposed I10 Essential (primary) hypertension 09/30/2017 No Yes JANICE, SEALES ([id number]) [id number] Page 8 of 13 Inactive Problems Resolved Problems Electronic Signature(s) Signed: 01/22/2022 12:57:16 PM By: Worthy Keeler PA-C Entered By: Worthy Keeler on 01/22/2022 12:57:16 -------------------------------------------------------------------------------- Progress Note Details Patient Name: Date of Service: [name] 01/22/2022 12:30 PM Medical Record Number: [id number] Patient Account Number: [id number] Date of Birth/Sex: Treating RN:  12/04/1940 (82 y.o. M) Primary Care Provider: [name] Other Clinician: Referring Provider: Treating Provider/Extender: [name] in Treatment: 225 Subjective Chief Complaint Information obtained from Patient Bilateral LE Ulcers History of Present Illness (HPI) 09/30/17 on evaluation today patient presents for initial evaluation and our clinic concerning issues that he has been having with his bilateral lower extremities. He states this has been going on for quite  some time at least six months. Currently his regiment has been mainly cleaning the area with peroxide, applying the is foreign ointment, and wrapping the area with ABD pads and then an ace wrap loosely. He has dealt with issues of this nature he tells me for quite some time. He does have a history of having had a compound fracture of the left lower extremity which he thinks also makes this a much more difficult area for him to heal. He's previously been told that he had poor vascular flow but this was years ago at [facility] we do not have any of those records at this time. He has a history of Epidermolysis Bullosa which was diagnosed around age 6 and he has been cared for at [facility] since that time. Subsequently he states this is hereditary and two of his children one male and one male also have this as well is one of his grandchildren that he is aware of. He has no evidence of infection necessarily at this point although he does have some necrotic tissue noted on the surface of the wound as far as the largest, left lateral lower extremity ulcer, is concerned. Overall I feel like all things considered he's been taking care of this very well. Obviously he has some fairly significant issues going on at this point in this regard. He does have a history otherwise of hypertension though for the most part other than the compound fracture of the left leg he seems to have been fairly healthy in my pinion. 10/07/17 on evaluation today patient actually appears to be doing better in regard to his bilateral lower extremity ulcers. With that being said he does still have some evidence of slough noted on the surface of the wounds I think the Iodoflex has been beneficial for him. His arterial studies are scheduled for October 2. With that being said I do believe that he is continuing to show signs of good improvement which is at least good news. 10/14/17 on evaluation today patient appears to be doing very well in regard  to his lower extremity ulcers. He's definitely made some progress as far as healing is concerned although there still are several open areas that are going to need to be addressed. He did have his arterial study today which fortunately shows good findings with a right ABI of 1.23 with a TBI of 0.86 in the left ABI of 1.28 with a TBI of 0.81. This is good news and will allow [place] to perform debridement as well. 10/23/2017; patient with a large wound on the left lateral calf, sizable area on the left medial malleolus and an area on the right lateral malleolus. He has a new blister consistent with his underlying blistering skin disease just above this area we have been using Iodoflex on the lateral left calf lateral right ankle and collagen on the medial left ankle. We have been using Kerlix Coban wraps 10/28/17 on evaluation today the patient continues to have signs of improvement in regard to the overall appearance of the original wound. Unfortunately he did have  some blistering over the right lateral lower extremity which has appeared to rupture on evaluation today and likely some of the dead tissue on the surface needs to be cleaned away the good news is this does not appear to be to significantly deep at this time. 11/04/17 on evaluation today patient actually appears to be doing a little better in regard to his lower extremity ulcers. He has been tolerating the dressing changes without complication. With that being said he does still have a significant wound especially over the left lateral lower extremity unfortunately. All of the wounds pretty much are going to require sharp debridement today. 11/11/17 on evaluation today patient appears to be doing more poorly in regard to his left lower extremity in particular. There does not appear to be any evidence of systemic infection although the wound itself as far as the larger left lateral lower extremity ulcer actually appears to be infected in my  pinion. There's an older and the surface of the wound is dramatically worsened compared to last week. No fevers, chills, nausea, or vomiting noted at this time. 11/18/17 upon evaluation today patient actually appears to be doing better. I did review his culture today which really did not show any specific organism is a positive reason for his wound decline. There are multiple organisms present not predominant. Nonetheless he seems to be tolerate the doxycycline well in his wounds in general do seem to be doing better. Fortunately there does not appear to be any evidence of infection at this time which is good news. Overall I'm very pleased with how things appear. Nonetheless he still has a lot of healing to go. I do think he could benefit from a Juxta-Lite wrap. 11/25/17 on evaluation today patient actually appears to be doing fairly well in regard to his wounds. He is still taking the antibiotics he has a few days left. Fortunately this seems to have been excellent for him as far as getting the infection control and very happy in this regard. With that being said the patient likewise is also very pleased with how things appear at this time in comparison to where we were he's not having as much pain. 12/02/17 Seen today for follow p and management of LLE wounds. Wounds appear to show some improvement. He denies pain, fever, or chills. Completed a course of doxycycline earlier this month. Scheduled to received Juxta-Lite wrap this week. No s/s of infections. 12/09/17 on evaluation today patient actually appears to be doing a little bit better in regard to his wounds. This is obscene very slow process and unfortunately JUANDAVID, DALLMAN ([id number]) [id number] Page 9 of 13 he has a couple of new areas and this is due to the Epidermolysis Bullosa. Nonetheless I am concerned about the fact that he seems to be getting more areas not less that is the reason we're gonna work on  getting schedule for the vascular referral to see the venous specialist. 12/23/17 upon evaluation today patient's wounds currently shows evidence of still not doing quite as well is what I would like to have seen. Subsequently the patient did have his venous study which showed evidence of venous stasis. Subsequently I do think that a vascular evaluation for consideration of venous intervention would be appropriate. I'm not necessarily suggesting that will be anything that can be done but I think it is at least a good idea. He is in agreement with this plan. 12/30/17 on evaluation today patient actually appears to be doing very well  in regard to his wounds when compared to previous evaluation. Subsequently we have been using the [facility] Dressing which actually appears to have done excellent on his left lateral lower extremity ulcer. The quality of the wound surface is dramatically improved. There is some slight debridement that is going to be required at a couple of locations but overall I'm extremely pleased with how things appear here. 01/07/2018; this is a patient with a primary skin disorder epidermolyis bullosa. Is a large wound on the left lateral calf and smaller wounds on the right however there is a new wound on the right mid tibia area that occurred within the compression wrap that he did not change. We have been using Hydrofera Blue. On the left he is using Hydrofera Blue and Santyl to the inferior part of the wound and changing the dressing himself. 01/15/2018; primary skin disorder epidermolysis bullosa. He has several difficult wounds including the left lateral calf, smaller wounds on the left medial calf and the right lateral calf. The major area on the left lateral calf has a smaller area inferiorly that has necrotic debris we have been using Santyl to this. The rest of the wounds we have been using Hydrofera Blue. The area on the left calf actually looks larger this week.  Uncontrolled edema several small open areas above it that are superficial 01/20/18 on evaluation today patient appears to be doing better as compared to last week in regard to his wounds of the bilateral lower extremities. He tolerated the bilateral compression wrap without complication. Overall I'm very pleased with how things appear at this time. The patient likewise is very happy. 01/27/18 on evaluation today patient appears to be doing decently well in regard to his bilateral lower extremity ulcers. He's been tolerating the dressing changes without complication. One issue he had is that he did have more drainage to the left leg wrapped last week. He states in fact he probably should come in and let us change it on Friday however he just left it in place and kept adding extra absorption with ABD pads to the external portion of the wrap. Unfortunately he does have some aspiration type breakdown nothing significant but I do believe that this was probably counterproductive in general. Nonetheless his wounds do not appear to be terrible overall. 02/03/18 on evaluation today patient appears to be doing rather well in regard to his lower extremity ulcers. He has been tolerating the dressing changes without complication. He does tell me that he had to change the wrap on the left one since we last saw him. Subsequently I do not see any evidence of infection I do feel like the food was much better controlled at this point. 02/10/18 on evaluation today patient appears to be doing rather well in regard to his ulcers. He still has significant alterations especially on the left lateral lower extremity. Fortunately there's no signs of infection at this time. Overall I feel like he is making good progress are some areas that I'm gonna attempt some debridement today. 02/17/18 on evaluation today patient appears to be doing okay in regard to his lower Trinity ulcer. It does appear on both locations he has a little bit of  drainage causing some breakdown in maceration around the wound bed's although it doesn't appear to be too bad the right is a little bit worse than left. Fortunately there's no signs of infection which is good news. No fevers, chills, nausea, or vomiting noted at this time. 03/10/18 on  evaluation today patient actually appears to be doing rather poorly in regard to his bilateral lower extremity ulcers. The right in particular is draining profusely and the wound is actually enlarging which is not good. I'm concerned about both possibly infection and the fact that there's a lot of moisture which is causing breakdown as well. Unfortunately the patient has been trying to change this at home I'm afraid he may need to change more frequently in order to see the improvement that were looking for. There's no signs of systemic infection. 03/17/18 patient actually appears to be doing significantly better at this point in regard to his bilateral lower extremity ulcers. Fortunately there's no signs of infection. That is worsening infection at least indefinitely nothing systemic. With that being said he is having a lot of drainage though not quite as much is there in his last evaluation. Overall feel like he's on the side of improvement. I think if his results back from his culture which showed that he had a positive group B strep along with abundant Pseudomonas noted on the culture. For that reason I am gonna have him continue with the linezolid as we previously have ordered for him and I did go ahead as well today and prescribe Levaquin as well in order to treat the Pseudomonas portion of the infection noted. 03/24/18 on evaluation today patient actually appears to be doing very well in regard to his lower Trinity ulcer. He's been tolerating the dressing changes without complication. Fortunately both legs show signs of less drainage in his edema is very well controlled at this point as well. Overall very pleased with  how things seem to be progressing. 03/31/18 on evaluation today patient actually appears to be doing excellent in regard to his bilateral lower extremity ulcers. These are not draining nearly as significant as what they were in the past overall seem to be shown signs of excellent improvement which is great news. Fortunately there is no sign of active infection at this time I do believe that the Levaquin has done extremely well for him in this regard. The patient continues to change these at home typically every day. We may be able to slowly work towards every other day changes since the drainage seems to be slowing down quite significantly. 04/14/18 on evaluation today patient appears to be doing well in regard to his bilateral lower extremities. Let me [facility] Almost completely healed which is excellent news. Fortunately he's shown signs of improvement all other sites as well with new skin growth there's some slight hyper granular tissue but for the most part this seems to be well maintained with the [facility] Dressing. I'm very happy in this regard. 04/28/18 on evaluation today patient appears to be doing rather well in regard to his ulcers of the bilateral lower extremities all things considering. He continues to make some progress as far as new skin growth. There still some hyper granulation noted at this point despite the use of the [facility] Dressing. This is not terrible but I think we may want to consider conclude cauterization today with silver nitrate to try to help knock some of his back as well as helping with any biofilm on the surface of the wound. 05/12/18 on evaluation today patient's wounds actually appear to be doing fairly well in regard to the bilateral lower extremities. He's been tolerating the dressing changes without complication. Fortunately there's no signs of active infection at this time which is good news. Overall very pleased with  how things seem to  be progressing. You select silver nitrate was beneficial for him. 05/26/18 on evaluation today patient appears to be doing better in regard to left lower extremity and a little bit worse in regard to the right lower extremity. He states that he was pulling off the [facility] Dressing peel back some of the skin making this area significantly larger than what it was previous. He's not had any issues other than this and states even that hasn't caused any pain he just seems to obviously have a much larger area on the right when compared to the previous time I saw him. No fevers, chills, nausea, or vomiting noted at this time. 06/16/18 on evaluation today patient actually appears to be doing a little better in my pinion in regard to his lower summary ulcers. He has new skin islands that seem to be spreading which is good news. Fortunately there's no signs of active infection at this time. His biggest issue is he tells me that coming as often as he does is becoming very cost prohibitive. He wonders if we can potentially spread this out. 07/14/18 on evaluation today patient appears to be doing a little bit worse in regard to his lower from the ulcer. Unfortunately he still continues to have a significant amount of drainage I think we need to do something to try to help this more. He is still somewhat reluctant to go the route of the Wound VAC although that may be the most appropriate thing for him. No fevers, chills, nausea, or vomiting noted at this time. 08/18/2018 on evaluation today patient actually appears to be doing quite well with regard to his bilateral lower extremity ulcers. I do feel like that currently he is making great progress the care max does seem to be doing a great job at helping to control the moisture he has no maceration or skin breakdown. Again this seems to be an excellent way to go. 1 thing we may want to change is adding collagen to the base of the wound and then the care max over top  he is not opposed to this. DIMETRIUS, MONTFORT ([id number]) [id number] Page 10 of 13 09/15/2018 on evaluation today patient appears to be doing well with regard to his bilateral lower extremity ulcers. He is showing some signs of improvement not necessarily in size but definitely in appearance. In fact he has a lot of new skin growing throughout the wounds along the edges as well as in the central portion of the wounds on both lower extremities. Overall I am extremely pleased to see this. 10/20/2018 on evaluation today patient actually appears to be doing quite well with regard to his wounds. They are not measuring significantly smaller but he does have a lot of new epithelization noted as compared to previous. Fortunately there is no signs of active infection at this time. No fevers, chills, nausea, vomiting, or diarrhea. 11/17/2018 on evaluation today patient presents for reevaluation concerning his bilateral lower extremity ulcers. Fortunately there is no signs of active infection at this time today. He has been tolerating the dressing changes without complication. No fevers, chills, nausea, vomiting, or diarrhea. Unfortunately in general the patient has not made as much improvement as I would like to have seen up to this point. He has been tolerating the dressing changes without complication and he does an excellent job taking care of his wounds at home in my opinion. The biggest issue I see is that he is just  not making the progress that we need to be seeing currently. I think we may want to consider having him seen at a plastic surgery appointment and he has previously seen someone in years past at [facility] in [place]. That is definitely a possibility for [place] to look into at this point. 12/29/2018 on evaluation today patient appears to be doing better in regard to the overall visual appearance of his wounds which do not appear to be as macerated. He  does have a much larger skin island in the middle of the left lower extremity ulcer which is doing much better. He tells me the pain is also significantly better. With that being said overall his improvement as far as the size of the wounds is not better but again these are very irregular in change shape quite often. Fortunately there is no evidence of active infection at this time which is great news. He never heard anything from [facility] regarding the plastic surgery referral that we made to them. 01/26/2019 upon evaluation today patient appears to be doing a little better in regard to his wounds today. He has been tolerating the dressing changes again he performs these for the most part on his own. He does a great job wrapping his legs in my opinion. Unfortunately he has not been able to get down to [facility] to see if there is anything from a plastic surgery standpoint that could be done to help with his legs simply due to the fact that his wife unfortunately sustained a compression fracture in her spine she is seeing Dr. Saintclair Halsted and subsequently is going to be having what sounds to be a kyphoplasty type procedure. With that being said that has not been scheduled yet there is still waiting on an MRI the patient is very busy in fact overly busy trying to help take care of his wife at this point. I completely understand this is more of a strain on him at this time 02/23/2019 upon evaluation today patient actually appears to be making some progress. I am actually very pleased with the overall appearance of his wounds even compared to last evaluation. He seems to be doing quite well. He is taking care of his wife unfortunately she did have a compression fracture she has had the procedure for this but still she has a slow road to recovery. For that reason he still not gone to [facility] for a second opinion in this regard. Obviously the goal there was if there was anything that can be done from a skin graft  standpoint or otherwise. 03/23/2019 upon evaluation today patient continues to have issues with lower extremity ulcers. Since the beginning he has made progress but at the same time the wounds unfortunately just will not close. We have been trying to get the patient to [facility] to see a specialist there but unfortunately with the everything going on with his wife he has not been able to make that appointment time yet he states he may be able to in the next 1-2 months but is not really sure. 04/27/2019 on evaluation today patient appears to be doing a little bit more poorly. His last evaluation. He appears to have some erythema around the edges of the wound at this point. Fortunately there is no signs of active infection at this time which is good news. No fevers, chills, nausea, vomiting, or diarrhea. 06/08/2019 on evaluation today patient appears to be doing well with regard to his  wounds. Overall they are actually measuring smaller compared to the last visit last month. We did treat him for Pseudomonas as well as methicillin-resistant Staph aureus. He was only on the treatment for MRSA however for 7 days as the Cipro was resistant and subsequently we had to place him on doxycycline. Nonetheless I am thinking that we may want to add the doxycycline and just do a month-long treatment considering the longstanding nature of his wounds and see if we get this under better control. 07/13/2019 upon evaluation today patient appears to be doing fairly well in regard to his bilateral lower extremities. There does not appear to be any signs of active infection which is good news. No fevers, chills, nausea, vomiting, or diarrhea. 08/10/2019 upon evaluation today patient appears to be doing about the same with regard to his wounds in general. Unfortunately he is not significantly better although is also not significantly worse which is great news there is no evidence of active infection at  this time which is good news. He still dealing with a lot going on with his wife and therefore is not really able to go see anyone at the specialty clinic at [facility] that we have previously set up still. 09/07/2019 on evaluation today patient appears to be doing well with regard to his wounds. In fact this is probably the best that have seen so far in quite a few months. Overall I am very pleased with where things stand at this time. No fevers, chills, nausea, vomiting, or diarrhea. 10/05/2019 upon evaluation today patient appears to be doing more poorly in regard to his legs at this point. He actually is showing some signs of infection. This has been something that we seem to be back-and-forth with as far as trying to keep these wounds from becoming infected. He takes care of them very well in my opinion but nonetheless I am concerned in this regard. He tells me that his wife is still doing really about the same she is slowly getting better. Nonetheless he still spends the majority of his time helping to take care of her. 11/02/2019 upon evaluation today patient actually appears to be doing somewhat better in regard to his legs. I do believe that the compounded antibiotic treatment from [facility] has been beneficial for him. Overall I am extremely pleased with where things stand today. There is no signs of active infection at this time. The patient states he has much less drainage than he has in the past. 12/07/2019 upon evaluation today patient appears to be doing well at this time in regard to the overall appearance of his wound bed. Currently there is no signs of active infection at this time. 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 Especially when he stressed. Fortunately there is no signs of active infection locally or systemically  at this point. 01/18/2020 on evaluation today patient appears to be doing well with regard to his leg ulcers. He has been tolerating the dressing changes without complication. Fortunately there is no signs of infection and overall I feel like his legs are doing about the best they have done in quite some time. There does not appear to be any evidence of active infection which is great news and overall very pleased. 02/29/2020 upon evaluation today patient's wounds actually appear to be doing  quite well currently. There is no sign of active infection at this time. No fevers, chills, nausea, vomiting, or diarrhea. 04/11/2025 on evaluation today patient appears to be doing excellent in regard to his wounds on the legs to be honest. He has made a lot of progress there does not appear to be any signs of active infection and overall I think that he is better than previous although still the wounds are quite significant obviously. In general I think that he is pleased with where things stand at this point. Still there is quite a bit of work to do here. 07/13/2020 upon evaluation today patient appears to be doing well with regard to his wound. Is been tolerating the dressing changes without complication on both legs. He just has the 2 main wounds on each leg at the ankle on the medial region of the right leg is completely healed. His wounds do seem to have gotten better during the time that he has been on bedrest as result of the pelvis fracture. Obviously does not the way that we wanted to see things improved but nonetheless he tells me that it is what it is. Fortunately he does seem to be doing better he tells me that her daughter from the [place] is actually down helping to take care of them out as he and his wife while he is recovering. 08/08/2020 upon evaluation today patient appears to be doing well currently in regard to his leg ulcers. He has been tolerating the dressing changes without complication.  Fortunately there is no signs of active infection at this time. No fevers, chills, nausea, vomiting, or diarrhea. 09/26/2020 upon evaluation today patient appears to be doing well with regard to his wound. He has been showing signs of good improvement which is great news and overall I am extremely pleased with where things stand today. I do think that he does well with his dressing changes and overall has really improved RASTUS, BORTON ([id number]) [id number] Page 11 of 13 significantly here. segment how that how she doing 11/14/2020 upon evaluation today patient appears to be doing well with regard to his leg ulcers. He has been tolerating the dressing changes without complication. Fortunately there does not appear to be any signs of active infection at this time. No fevers, chills, nausea, vomiting, or diarrhea. 12/26/2020 upon evaluation today patient actually appears to be making some good progress here in regard to his wounds. Fortunately I do not see any signs of active infection which is great news. No fevers, chills, nausea, vomiting, or diarrhea. 02/06/2021 upon evaluation today patient appears to be doing well with regard to his wound currently. He has been tolerating the dressing changes without complication. Fortunately there does not appear to be any evidence of active infection locally nor systemically at this time. No fevers, chills, nausea, vomiting, or diarrhea. Overall I think that this is very slow but does seem to be getting a little bit better and a little smaller he has been through a lot of stress recently with his wife she is having a heart evaluation tomorrow to see whether or not an aortic valve replacement is something that she could tolerate and undergo. 04/03/2020 upon evaluation today patient's legs are actually showing signs of significant improvement. This is actually a dramatic overall improvement since I last saw him I think we are getting  very close to closing these wounds. That is an amazing thing is we been dealing with this for quite some time. The patient  is very pleased to hear things appear to be doing well as well. 05-15-2021 upon evaluation today patient appears to be doing well with regard to his wound on the bilateral lower extremities. Both are showing signs of excellent improvement which is great news. There does not appear to be any signs of active infection locally nor systemically at this time. No fevers, chills, nausea, vomiting, or diarrhea. 06-26-2021 upon evaluation today patient appears to be doing well with regard to his wounds on the legs. They are actually showing signs of improvement in my opinion which is great news. Fortunately I do not see any evidence of active infection locally or systemically which is great news. No fevers, chills, nausea, vomiting, or diarrhea. 08-21-2021 upon evaluation today patient appears to be making excellent progress. Has been using a mixture of topical antibiotic ointment followed by the ABD pads which does seem to be doing quite well. I do not see any evidence of active infection locally or systemically at this time which is great news. With that being said I think that he is very close to complete resolution. 10-09-2021 upon evaluation today patient appears to be doing excellent in regard to his wound. In fact both legs are actually looking well. He has a lot of new skin very few openings remaining at this point. Fortunately I do not see any signs of active infection locally or systemically at this time. 11-27-2021 upon evaluation today patient appears to be doing well currently in regard to his wounds. He has been tolerating the dressing changes without complication. Fortunately there does not appear to be any signs of active infection at this time which is great news. No fevers, chills, nausea, vomiting, or diarrhea. 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 01-22-2022 upon evaluation today patient appears to be doing well currently in regard to his wounds he is actually showing signs of improvement compared to where he has been previous. Fortunately I do not see any signs of active infection locally nor systemically which is great news. No fevers, chills, nausea, vomiting, or diarrhea. Objective Constitutional Well-nourished and well-hydrated in no acute distress. Vitals Time Taken: 12:43 PM, Height: 71 in, Weight: 220 lbs, BMI: 30.7, Pulse: 76 bpm, Respiratory Rate: 20 breaths/min, Blood Pressure: 172/73 mmHg. Respiratory normal breathing without difficulty. Psychiatric this patient is able to make decisions and demonstrates good insight into disease process. Alert and Oriented x 3. pleasant and cooperative. General Notes: Upon inspection patient did not require any sharp debridement today. 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 He has some areas that pop up. There is far as new areas of blistering are concerned but again this has really been minimal compared to where we have been previous. I think that he is on the right track which is great news. Integumentary (Hair, Skin) Wound #19 status is Open. Original cause of wound was Gradually Appeared. The date acquired was: 01/15/2022. The wound is located on the Left,Medial Ankle. The wound measures 4.5cm length x 1.5cm width x 0.1cm depth; 5.301cm^2 area and 0.53cm^3 volume. There is Fat Layer (Subcutaneous Tissue) exposed. There is no tunneling or undermining noted. There is a medium amount of serosanguineous drainage noted. The wound margin is distinct with the outline attached to the wound base.  There is large (67-100%) red, pink granulation within the wound bed. There is a small (1-33%) amount of necrotic tissue  within the wound bed including Adherent Slough. The periwound skin appearance exhibited: Scarring, Hemosiderin Staining. The periwound skin appearance did not exhibit: Callus, Crepitus, Excoriation, Induration, Rash, Dry/Scaly, Maceration, Atrophie Blanche, Cyanosis, Ecchymosis, Mottled, Pallor, Rubor, Erythema. Wound #2 status is Open. Original cause of wound was Gradually Appeared. The date acquired was: 03/13/2017. The wound has been in treatment 225 weeks. The wound is located on the Left,Distal,Lateral Lower Leg. The wound measures 8.5cm length x 7.5cm width x 0.1cm depth; 50.069cm^2 area and 5.007cm^3 volume. There is Fat Layer (Subcutaneous Tissue) exposed. There is no tunneling or undermining noted. There is a medium amount of serosanguineous drainage noted. The wound margin is distinct with the outline attached to the wound base. There is large (67-100%) red granulation within the wound bed. There is a small (1-33%) amount of necrotic tissue within the wound bed including Adherent Slough. The periwound skin appearance exhibited: Dry/Scaly, Hemosiderin Staining. The periwound skin appearance did not exhibit: Erythema. Wound #5 status is Open. Original cause of wound was Gradually Appeared. The date acquired was: 10/26/2017. The wound has been in treatment 221 weeks. The wound is located on the Right,Lateral,Anterior Lower Leg. The wound measures 5.3cm length x 4.2cm width x 0.1cm depth; 17.483cm^2 area and 1.748cm^3 volume. There is Fat Layer (Subcutaneous Tissue) exposed. There is no tunneling or undermining noted. There is a medium amount of serosanguineous drainage noted. The wound margin is distinct with the outline attached to the wound base. There is large (67-100%) red granulation within the wound bed. There is no necrotic tissue within the wound bed. The periwound skin appearance exhibited: Hemosiderin Staining. The periwound skin appearance did not exhibit: Erythema. 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 There was a Chemical/Enzymatic/Mechanical debridement performed by [name], RN.. Other agent used was Gauze, Wound Cleaner. There was no bleeding. The procedure was tolerated well. Post Debridement Measurements: 4.5cm length x 1.5cm width x 0.1cm depth; 0.53cm^3 volume. Character of Wound/Ulcer Post Debridement is stable. Severity of Tissue Post Debridement is: Fat layer exposed. Post procedure Diagnosis Wound #19: Same as Pre-Procedure Plan Follow-up Appointments: Return Appointment in: - 2 months with Margarita Grizzle on Wednesday's 04/02/2022 1230 room 7 Other: - [facility]=Supplies **apply over the counter hydrocortisone cream for any itching areas. Bathing/ Shower/ Hygiene: May shower and wash wound with soap and water. - on days that dressing is changed Edema Control - Lymphedema / SCD / Other: Avoid standing for long periods of time. Exercise regularly Moisturize legs daily. Compression stocking or Garment 20-30 mm/Hg pressure to: - Juxtalite to both legs daily WOUND #19: - Ankle Wound Laterality: Left, Medial Cleanser: Soap and Water 3 x Per Week/30 Days Discharge Instructions: May shower and wash wound with dial antibacterial soap and water prior to dressing change. Peri-Wound Care: Zinc Oxide Ointment 30g tube 3 x Per Week/30 Days Discharge Instructions: Apply Zinc Oxide to periwound with  each dressing change as  needed for maceration Peri-Wound Care: Sween Lotion (Moisturizing lotion) 3 x Per Week/30 Days Discharge Instructions: Apply moisturizing lotion as directed Topical: bacitracin 3 x Per Week/30 Days Discharge Instructions: apply three times a week. Secondary Dressing: ABD Pad, 5x9 (Generic) 3 x Per Week/30 Days Discharge Instructions: Apply over primary dressing as directed. Secured With: [employer], 4.5x3.1 (in/yd) (Generic) 3 x Per Week/30 Days Discharge Instructions: Secure with Kerlix as directed. Com pression Wrap: tubigrip D one layer 3 x Per Week/30 Days Discharge Instructions: apply over the dressing from base of toes to just below the knee. WOUND #2: - Lower Leg Wound Laterality: Left, Lateral, Distal Cleanser: Soap and Water 3 x Per Week/30 Days Discharge Instructions: May shower and wash wound with dial antibacterial soap and water prior to dressing change. Peri-Wound Care: Zinc Oxide Ointment 30g tube 3 x Per Week/30 Days Discharge Instructions: Apply Zinc Oxide to periwound with each dressing change as needed for maceration Peri-Wound Care: Sween Lotion (Moisturizing lotion) 3 x Per Week/30 Days Discharge Instructions: Apply moisturizing lotion as directed Topical: bacitracin 3 x Per Week/30 Days Discharge Instructions: apply three times a week. Secondary Dressing: ABD Pad, 5x9 (Generic) 3 x Per Week/30 Days Discharge Instructions: Apply over primary dressing as directed. Secured With: [employer], 4.5x3.1 (in/yd) (Generic) 3 x Per Week/30 Days Discharge Instructions: Secure with Kerlix as directed. Com pression Wrap: tubigrip D one layer 3 x Per Week/30 Days Discharge Instructions: apply over the dressing from base of toes to just below the knee. WOUND #5: - Lower Leg Wound Laterality: Right, Lateral, Anterior Cleanser: Soap and Water 3 x Per Week/30 Days Discharge Instructions: May shower and wash wound with dial antibacterial soap  and water prior to dressing change. Peri-Wound Care: Zinc Oxide Ointment 30g tube 3 x Per Week/30 Days Discharge Instructions: Apply Zinc Oxide to periwound with each dressing change as needed for maceration Peri-Wound Care: Sween Lotion (Moisturizing lotion) 3 x Per Week/30 Days Discharge Instructions: Apply moisturizing lotion as directed Topical: bacitracin 3 x Per Week/30 Days Discharge Instructions: apply three times a week. Secondary Dressing: ABD Pad, 5x9 (Generic) 3 x Per Week/30 Days Discharge Instructions: Apply over primary dressing as directed. Secured With: [employer], 4.5x3.1 (in/yd) (Generic) 3 x Per Week/30 Days Discharge Instructions: Secure with Kerlix as directed. TARL, CEPHAS ([id number]) [id number] Page 13 of 13 Compression Wrap: tubigrip D one layer 3 x Per Week/30 Days Discharge Instructions: apply over the dressing from base of toes to just below the knee. 1. I am going to recommend that we have the patient continue to monitor for any signs of infection or worsening. Based on what we are seeing I do believe that he is really making excellent progress here. 2. I am going to switch away from the Ace wrap and have him use a Tubigrip as I feel like this will be something more uniform. He voiced understanding he states he will give it a try. With that being said if he does not feel like it is helpful that he can always go back to the Tubigrip. We will see patient back for reevaluation in 2 months here in the clinic. If anything worsens or changes patient will contact our office for additional recommendations. Electronic Signature(s) Signed: 01/22/2022 2:06:27 PM By: Worthy Keeler PA-C Entered By: Worthy Keeler on 01/22/2022 14:06:27 -------------------------------------------------------------------------------- SuperBill Details Patient Name: Date of Service: [name] 01/22/2022 Medical Record Number:  [id number] Patient Account Number: [id number]  Date of Birth/Sex: Treating RN: 1940-09-03 (82 y.o. [name] Primary Care Provider: [name] Other Clinician: Referring Provider: Treating Provider/Extender: [name] in Treatment: 225 Diagnosis Coding ICD-10 Codes Code Description I87.2 Venous insufficiency (chronic) (peripheral) Q81.8 Other epidermolysis bullosa L97.322 Non-pressure chronic ulcer of left ankle with fat layer exposed L97.822 Non-pressure chronic ulcer of other part of left lower leg with fat layer exposed L97.812 Non-pressure chronic ulcer of other part of right lower leg with fat layer exposed I10 Essential (primary) hypertension Physician Procedures : CPT4 Code Description Modifier [id number] - WC PHYS LEVEL 3 - EST PT ICD-10 Diagnosis Description I87.2 Venous insufficiency (chronic) (peripheral) Q81.8 Other epidermolysis bullosa L97.322 Non-pressure chronic ulcer of left ankle with fat layer  exposed L97.822 Non-pressure chronic ulcer of other part of left lower leg with fat layer exposed Quantity: 1 Electronic Signature(s) Signed: 01/22/2022 2:07:23 PM By: Worthy Keeler PA-C Entered By: Worthy Keeler on 01/22/2022 14:07:22

## 2022-01-23 NOTE — Progress Notes (Signed)
Carlos White ([id number]) [id number] Page 1 of 10 Visit Report for 01/22/2022 Arrival Information Details Patient Name: Date of Service: Carlos White 01/22/2022 12:30 PM Medical Record Number: [id number] Patient Account Number: [id number] Date of Birth/Sex: Treating RN: 1940/04/30 (82 y.o. M) Primary Care Parminder Cupples: Carlos White Other Clinician: Referring Talyah Seder: Treating Reneshia Zuccaro/Extender: [name] in Treatment: 74 Visit Information History Since Last Visit Added or deleted any medications: No Patient Arrived: Ambulatory Any new allergies or adverse reactions: No Arrival Time: 12:43 Had a fall or experienced change in No Accompanied By: self activities of daily living that may affect Transfer Assistance: None risk of falls: Patient Identification Verified: Yes Signs or symptoms of abuse/neglect since last visito No Secondary Verification Process Completed: Yes Hospitalized since last visit: No Patient Requires Transmission-Based Precautions: No Implantable device outside of the clinic excluding No Patient Has Alerts: Yes cellular tissue based products placed in the center Patient Alerts: R ABI= 1.23, TBI = .86 since last visit: L ABI= 1.28, TBI=.81 Has Dressing in Place as Prescribed: Yes Pain Present Now: No Electronic Signature(s) Signed: 01/22/2022 4:41:43 PM By: Erenest Blank Entered By: Erenest Blank on 01/22/2022 12:43:40 -------------------------------------------------------------------------------- Clinic Level of Care Assessment Details Patient Name: Date of Service: Carlos White 01/22/2022 12:30 PM Medical Record Number: [id number] Patient Account Number: [id number] Date of Birth/Sex: Treating RN: 13-Jan-1941 (82 y.o. Carlos White Primary Care Carlos White: Carlos White Other Clinician: Referring Carlos White: Treating Carlos White/Extender: [name] in Treatment: 225 Clinic  Level of Care Assessment Items TOOL 4 Quantity Score X- 1 0 Use when only an EandM is performed on FOLLOW-UP visit ASSESSMENTS - Nursing Assessment / Reassessment X- 1 10 Reassessment of Co-morbidities (includes updates in patient status) X- 1 5 Reassessment of Adherence to Treatment Plan ASSESSMENTS - Wound and Skin A ssessment / Reassessment '[]'$  - 0 Simple Wound Assessment / Reassessment - one wound X- 3 5 Complex Wound Assessment / Reassessment - multiple wounds X- 1 10 Dermatologic / Skin Assessment (not related to wound area) ASSESSMENTS - Focused Assessment X- 2 5 Circumferential Edema Measurements - multi extremities '[]'$  - 0 Nutritional Assessment / Counseling / Intervention Carlos White ([id number]) [phone].pdf Page 2 of 10 '[]'$  - 0 Lower Extremity Assessment (monofilament, tuning fork, pulses) '[]'$  - 0 Peripheral Arterial Disease Assessment (using hand held doppler) ASSESSMENTS - Ostomy and/or Continence Assessment and Care '[]'$  - 0 Incontinence Assessment and Management '[]'$  - 0 Ostomy Care Assessment and Management (repouching, etc.) PROCESS - Coordination of Care '[]'$  - 0 Simple Patient / Family Education for ongoing care X- 1 20 Complex (extensive) Patient / Family Education for ongoing care X- 1 10 Staff obtains Programmer, systems, Records, T Results / Process Orders est '[]'$  - 0 Staff telephones HHA, Nursing Homes / Clarify orders / etc '[]'$  - 0 Routine Transfer to another Facility (non-emergent condition) '[]'$  - 0 Routine Hospital Admission (non-emergent condition) '[]'$  - 0 New Admissions / Biomedical engineer / Ordering NPWT Apligraf, etc. , '[]'$  - 0 Emergency Hospital Admission (emergent condition) '[]'$  - 0 Simple Discharge Coordination X- 1 15 Complex (extensive) Discharge Coordination PROCESS - Special Needs '[]'$  - 0 Pediatric / Minor Patient Management '[]'$  - 0 Isolation Patient Management '[]'$  - 0 Hearing / Language / Visual special needs '[]'$   - 0 Assessment of Community assistance (transportation, D/C planning, etc.) '[]'$  - 0 Additional assistance / Altered mentation '[]'$  - 0 Support Surface(s) Assessment (bed, cushion, seat,  etc.) INTERVENTIONS - Wound Cleansing / Measurement '[]'$  - 0 Simple Wound Cleansing - one wound X- 3 5 Complex Wound Cleansing - multiple wounds X- 1 5 Wound Imaging (photographs - any number of wounds) '[]'$  - 0 Wound Tracing (instead of photographs) '[]'$  - 0 Simple Wound Measurement - one wound X- 3 5 Complex Wound Measurement - multiple wounds INTERVENTIONS - Wound Dressings '[]'$  - 0 Small Wound Dressing one or multiple wounds '[]'$  - 0 Medium Wound Dressing one or multiple wounds X- 2 20 Large Wound Dressing one or multiple wounds '[]'$  - 0 Application of Medications - topical '[]'$  - 0 Application of Medications - injection INTERVENTIONS - Miscellaneous '[]'$  - 0 External ear exam '[]'$  - 0 Specimen Collection (cultures, biopsies, blood, body fluids, etc.) '[]'$  - 0 Specimen(s) / Culture(s) sent or taken to Lab for analysis '[]'$  - 0 Patient Transfer (multiple staff / Civil Service fast streamer / Similar devices) '[]'$  - 0 Simple Staple / Suture removal (25 or less) '[]'$  - 0 Complex Staple / Suture removal (26 or more) '[]'$  - 0 Hypo / Hyperglycemic Management (close monitor of Blood Glucose) TAJI, BARRETTO ([id number]) [id number] Page 3 of 10 '[]'$  - 0 Ankle / Brachial Index (ABI) - do not check if billed separately X- 1 5 Vital Signs Has the patient been seen at the hospital within the last three years: Yes Total Score: 175 Level Of Care: New/Established - Level 5 Electronic Signature(s) Signed: 01/22/2022 5:22:16 PM By: [name] RN, BSN Entered By: [name] on 01/22/2022 16:26:46 -------------------------------------------------------------------------------- Encounter Discharge Information Details Patient Name: Date of Service: Carlos White, [place]. 01/22/2022 12:30 PM Medical Record Number:  [id number] Patient Account Number: [id number] Date of Birth/Sex: Treating RN: 07-14-1940 (82 y.o. Carlos White Primary Care Carlos White: Carlos White Other Clinician: Referring Latanza Pfefferkorn: Treating Wasyl Dornfeld/Extender: [name] in Treatment: 225 Encounter Discharge Information Items Post Procedure Vitals Discharge Condition: Stable Temperature (F): 98.7 Ambulatory Status: Ambulatory Pulse (bpm): 76 Discharge Destination: Home Respiratory Rate (breaths/min): 20 Transportation: Private Auto Blood Pressure (mmHg): 172/73 Accompanied By: self Schedule Follow-up Appointment: Yes Clinical Summary of Care: Electronic Signature(s) Signed: 01/22/2022 5:22:16 PM By: [name] RN, BSN Entered By: [name] on 01/22/2022 13:06:10 -------------------------------------------------------------------------------- Lower Extremity Assessment Details Patient Name: Date of Service: [name] 01/22/2022 12:30 PM Medical Record Number: [id number] Patient Account Number: [id number] Date of Birth/Sex: Treating RN: May 06, 1940 (82 y.o. M) Primary Care Otniel Hoe: Carlos White Other Clinician: Referring Lamont Tant: Treating Adon Gehlhausen/Extender: [name] in Treatment: 225 Edema Assessment Assessed: [Left: Yes] [Right: Yes] Edema: [Left: Yes] [Right: Yes] Calf Left: Right: Point of Measurement: 35.5 cm From Medial Instep 39.1 cm 40.5 cm Ankle Left: Right: Point of Measurement: 11 cm From Medial Instep 23.5 cm 26.4 cm Electronic Signature(s) Signed: 01/22/2022 4:41:43 PM By: Lorenda Peck ([id number]) PM By: Erenest Blank [id number].pdf Page 4 of 10 Signed: 01/22/2022 4:41:43 Entered By: Erenest Blank on 01/22/2022 12:52:16 -------------------------------------------------------------------------------- Multi-Disciplinary Care Plan Details Patient Name: Date of Service: Carlos White 01/22/2022  12:30 PM Medical Record Number: [id number] Patient Account Number: [id number] Date of Birth/Sex: Treating RN: 1940/10/29 (82 y.o. Carlos White Primary Care Marvis Bakken: Carlos White Other Clinician: Referring Ziyon Soltau: Treating Kamaal Cast/Extender: [name] in Treatment: [place] reviewed with physician Active Inactive Venous Leg Ulcer Nursing Diagnoses: Knowledge deficit related to disease process and management Potential for venous Insuffiency (use before  diagnosis confirmed) Goals: Patient will maintain optimal edema control Date Initiated: 09/30/2017 Target Resolution Date: 03/14/2022 Goal Status: Active Patient/caregiver will verbalize understanding of disease process and disease management Date Initiated: 09/30/2017 Date Inactivated: 11/04/2017 Target Resolution Date: 10/30/2017 Goal Status: Met Interventions: Assess peripheral edema status every visit. Provide education on venous insufficiency Notes: Electronic Signature(s) Signed: 01/22/2022 5:22:16 PM By: [name] RN, BSN Entered By: [name] on 01/22/2022 12:55:31 -------------------------------------------------------------------------------- Pain Assessment Details Patient Name: Date of Service: [name] 01/22/2022 12:30 PM Medical Record Number: [id number] Patient Account Number: [id number] Date of Birth/Sex: Treating RN: [birth year] (82 y.o. M) Primary Care Shakiyla Kook: Carlos White Other Clinician: Referring Arkel Cartwright: Treating Caly Pellum/Extender: [name] in Treatment: 225 Active Problems Location of Pain Severity and Description of Pain Patient Has Paino No Site Locations [place], [place] ([id number]) [id number] Page 5 of 10 Pain Management and Medication Current Pain Management: Electronic Signature(s) Signed: 01/22/2022 4:41:43 PM By: Erenest Blank Entered By: Erenest Blank on 01/22/2022  12:44:04 -------------------------------------------------------------------------------- Patient/Caregiver Education Details Patient Name: Date of Service: Carlos White 1/10/2024andnbsp12:30 PM Medical Record Number: [id number] Patient Account Number: [id number] Date of Birth/Gender: Treating RN: December 28, 1940 (82 y.o. Carlos White Primary Care Physician: Carlos White Other Clinician: Referring Physician: Treating Physician/Extender: [name] in Treatment: 72 Education Assessment Education Provided To: Patient Education Topics Provided Wound/Skin Impairment: Handouts: Caring for Your Ulcer Methods: Explain/Verbal Responses: Reinforcements needed Electronic Signature(s) Signed: 01/22/2022 5:22:16 PM By: [name] RN, BSN Entered By: [name] on 01/22/2022 12:55:46 -------------------------------------------------------------------------------- Wound Assessment Details Patient Name: Date of Service: [name] 01/22/2022 12:30 PM Medical Record Number: [id number] Patient Account Number: [id number] Date of Birth/Sex: Treating RN: 1940-02-08 (81 y.o. Carlos White Primary Care Shelene Krage: Carlos White Other Clinician: DESIREE, FLEMING ([id number]) [id number] Page 6 of 10 Referring Kyan Yurkovich: Treating Jlyn Cerros/Extender: [name] in Treatment: 225 Wound Status Wound Number: 19 Primary Venous Leg Ulcer Etiology: Wound Location: Left, Medial Ankle Wound Status: Open Wounding Event: Gradually Appeared Comorbid Cataracts, Chronic Obstructive Pulmonary Disease (COPD), Date Acquired: 01/15/2022 History: Hypertension Weeks Of Treatment: 0 Clustered Wound: No Photos Wound Measurements Length: (cm) 4.5 Width: (cm) 1.5 Depth: (cm) 0.1 Area: (cm) 5.301 Volume: (cm) 0.53 % Reduction in Area: % Reduction in Volume: Epithelialization: Small (1-33%) Tunneling: No Undermining:  No Wound Description Classification: Full Thickness Without Exposed Suppor Wound Margin: Distinct, outline attached Exudate Amount: Medium Exudate Type: Serosanguineous Exudate Color: red, brown t Structures Foul Odor After Cleansing: No Slough/Fibrino Yes Wound Bed Granulation Amount: Large (67-100%) Exposed Structure Granulation Quality: Red, Pink Fascia Exposed: No Necrotic Amount: Small (1-33%) Fat Layer (Subcutaneous Tissue) Exposed: Yes Necrotic Quality: Adherent Slough Tendon Exposed: No Muscle Exposed: No Joint Exposed: No Bone Exposed: No Periwound Skin Texture Texture Color No Abnormalities Noted: No No Abnormalities Noted: No Callus: No Atrophie Blanche: No Crepitus: No Cyanosis: No Excoriation: No Ecchymosis: No Induration: No Erythema: No Rash: No Hemosiderin Staining: Yes Scarring: Yes Mottled: No Pallor: No Moisture Rubor: No No Abnormalities Noted: No Dry / Scaly: No Maceration: No Treatment Notes Wound #19 (Ankle) Wound Laterality: Left, Medial Cleanser Soap and Water Discharge Instruction: May shower and wash wound with dial antibacterial soap and water prior to dressing change. Peri-Wound Care Zinc Oxide Ointment 30g tube Discharge Instruction: Apply Zinc Oxide to periwound with each dressing change as needed for maceration JAIDYN, KUHL ([id number]) [phone].pdf Page 7 of  10 Sween Lotion (Moisturizing lotion) Discharge Instruction: Apply moisturizing lotion as directed Topical bacitracin Discharge Instruction: apply three times a week. Primary Dressing Secondary Dressing ABD Pad, 5x9 Discharge Instruction: Apply over primary dressing as directed. Secured With [employer], 4.5x3.1 (in/yd) Discharge Instruction: Secure with Kerlix as directed. Compression Wrap tubigrip D one layer Discharge Instruction: apply over the dressing from base of toes to just below the knee. Compression  Stockings Add-Ons Electronic Signature(s) Signed: 01/22/2022 4:41:43 PM By: Erenest Blank Signed: 01/22/2022 5:22:16 PM By: [name] RN, BSN Entered By: Erenest Blank on 01/22/2022 12:55:30 -------------------------------------------------------------------------------- Wound Assessment Details Patient Name: Date of Service: [name] 01/22/2022 12:30 PM Medical Record Number: [id number] Patient Account Number: [id number] Date of Birth/Sex: Treating RN: 1940-02-19 (82 y.o. M) Primary Care Tonetta Napoles: Carlos White Other Clinician: Referring Damien Batty: Treating Donnavan Covault/Extender: [name] in Treatment: 225 Wound Status Wound Number: 2 Primary Venous Leg Ulcer Etiology: Wound Location: Left, Distal, Lateral Lower Leg Wound Status: Open Wounding Event: Gradually Appeared Comorbid Cataracts, Chronic Obstructive Pulmonary Disease (COPD), Date Acquired: 03/13/2017 History: Hypertension Weeks Of Treatment: 225 Clustered Wound: Yes Photos Wound Measurements Length: (cm) 8.5 Width: (cm) 7.5 Depth: (cm) 0.1 Clustered Quantity: 1 Fortune, Margie H ([id number]) Area: (cm) 50.069 Volume: (cm) 5.007 % Reduction in Area: 35.4% % Reduction in Volume: 87.1% Epithelialization: Medium (34-66%) Tunneling: No [id number] Page 8 of 10 Undermining: No Wound Description Classification: Full Thickness Without Exposed Suppor Wound Margin: Distinct, outline attached Exudate Amount: Medium Exudate Type: Serosanguineous Exudate Color: red, brown t Structures Foul Odor After Cleansing: No Slough/Fibrino Yes Wound Bed Granulation Amount: Large (67-100%) Exposed Structure Granulation Quality: Red Fascia Exposed: No Necrotic Amount: Small (1-33%) Fat Layer (Subcutaneous Tissue) Exposed: Yes Necrotic Quality: Adherent Slough Tendon Exposed: No Muscle Exposed: No Joint Exposed: No Bone Exposed: No Periwound Skin Texture Texture  Color No Abnormalities Noted: No No Abnormalities Noted: No Erythema: No Moisture Hemosiderin Staining: Yes No Abnormalities Noted: No Dry / Scaly: Yes Treatment Notes Wound #2 (Lower Leg) Wound Laterality: Left, Lateral, Distal Cleanser Soap and Water Discharge Instruction: May shower and wash wound with dial antibacterial soap and water prior to dressing change. Peri-Wound Care Zinc Oxide Ointment 30g tube Discharge Instruction: Apply Zinc Oxide to periwound with each dressing change as needed for maceration Sween Lotion (Moisturizing lotion) Discharge Instruction: Apply moisturizing lotion as directed Topical bacitracin Discharge Instruction: apply three times a week. Primary Dressing Secondary Dressing ABD Pad, 5x9 Discharge Instruction: Apply over primary dressing as directed. Secured With [employer], 4.5x3.1 (in/yd) Discharge Instruction: Secure with Kerlix as directed. Compression Wrap tubigrip D one layer Discharge Instruction: apply over the dressing from base of toes to just below the knee. Compression Stockings Add-Ons Electronic Signature(s) Signed: 01/22/2022 4:41:43 PM By: Erenest Blank Entered By: Erenest Blank on 01/22/2022 12:53:56 [name] ([id number]) [id number] Page 9 of 10 -------------------------------------------------------------------------------- Wound Assessment Details Patient Name: Date of Service: Carlos White 01/22/2022 12:30 PM Medical Record Number: [id number] Patient Account Number: [id number] Date of Birth/Sex: Treating RN: 01/21/1940 (82 y.o. M) Primary Care Tramel Westbrook: Carlos White Other Clinician: Referring Jeovani Weisenburger: Treating Madhavi Hamblen/Extender: [name] in Treatment: 225 Wound Status Wound Number: 5 Primary Vasculopathy Etiology: Wound Location: Right, Lateral, Anterior Lower Leg Wound Status: Open Wounding Event: Gradually Appeared Comorbid Cataracts,  Chronic Obstructive Pulmonary Disease (COPD), Date Acquired: 10/26/2017 History: Hypertension Weeks Of Treatment: 221 Clustered Wound: Yes Photos Wound Measurements Length: (cm) Width: (  cm) Depth: (cm) Clustered Quantity: Area: (cm) Volume: (cm) 5.3 % Reduction in Area: 67.8% 4.2 % Reduction in Volume: 67.8% 0.1 Epithelialization: Large (67-100%) 2 Tunneling: No 17.483 Undermining: No 1.748 Wound Description Classification: Full Thickness Without Exposed Sup Wound Margin: Distinct, outline attached Exudate Amount: Medium Exudate Type: Serosanguineous Exudate Color: red, brown port Structures Foul Odor After Cleansing: No Slough/Fibrino Yes Wound Bed Granulation Amount: Large (67-100%) Exposed Structure Granulation Quality: Red Fascia Exposed: No Necrotic Amount: None Present (0%) Fat Layer (Subcutaneous Tissue) Exposed: Yes Tendon Exposed: No Muscle Exposed: No Joint Exposed: No Bone Exposed: No Periwound Skin Texture Texture Color No Abnormalities Noted: No No Abnormalities Noted: No Erythema: No Moisture Hemosiderin Staining: Yes No Abnormalities Noted: No Treatment Notes Wound #5 (Lower Leg) Wound Laterality: Right, Lateral, Anterior Cleanser Soap and Water Discharge Instruction: May shower and wash wound with dial antibacterial soap and water prior to dressing change. Peri-Wound Care Zinc Oxide Ointment 30g tube Discharge Instruction: Apply Zinc Oxide to periwound with each dressing change as needed for maceration DAVONTAY, [place] ([id number]) [phone].pdf Page 10 of 10 Sween Lotion (Moisturizing lotion) Discharge Instruction: Apply moisturizing lotion as directed Topical bacitracin Discharge Instruction: apply three times a week. Primary Dressing Secondary Dressing ABD Pad, 5x9 Discharge Instruction: Apply over primary dressing as directed. Secured With [employer], 4.5x3.1 (in/yd) Discharge Instruction: Secure  with Kerlix as directed. Compression Wrap tubigrip D one layer Discharge Instruction: apply over the dressing from base of toes to just below the knee. Compression Stockings Add-Ons Electronic Signature(s) Signed: 01/22/2022 4:41:43 PM By: Erenest Blank Entered By: Erenest Blank on 01/22/2022 12:54:30 -------------------------------------------------------------------------------- Vitals Details Patient Name: Date of Service: Carlos White, GO RDO N H. 01/22/2022 12:30 PM Medical Record Number: [id number] Patient Account Number: [id number] Date of Birth/Sex: Treating RN: [birth year] (82 y.o. M) Primary Care Mihir Flanigan: Carlos White Other Clinician: Referring Faraz Ponciano: Treating Jennene Downie/Extender: [name] in Treatment: 225 Vital Signs Time Taken: 12:43 Pulse (bpm): 76 Height (in): 71 Respiratory Rate (breaths/min): 20 Weight (lbs): 220 Blood Pressure (mmHg): 172/73 Body Mass Index (BMI): 30.7 Reference Range: 80 - 120 mg / dl Electronic Signature(s) Signed: 01/22/2022 4:41:43 PM By: Erenest Blank Entered By: Erenest Blank on 01/22/2022 12:43:58

## 2022-01-31 DIAGNOSIS — D485 Neoplasm of uncertain behavior of skin: Secondary | ICD-10-CM | POA: Diagnosis not present

## 2022-01-31 DIAGNOSIS — L910 Hypertrophic scar: Secondary | ICD-10-CM | POA: Diagnosis not present

## 2022-02-28 ENCOUNTER — Other Ambulatory Visit: Payer: Self-pay | Admitting: Internal Medicine

## 2022-03-17 DIAGNOSIS — H353132 Nonexudative age-related macular degeneration, bilateral, intermediate dry stage: Secondary | ICD-10-CM | POA: Diagnosis not present

## 2022-03-17 DIAGNOSIS — H35033 Hypertensive retinopathy, bilateral: Secondary | ICD-10-CM | POA: Diagnosis not present

## 2022-03-17 DIAGNOSIS — H43813 Vitreous degeneration, bilateral: Secondary | ICD-10-CM | POA: Diagnosis not present

## 2022-04-02 ENCOUNTER — Encounter (HOSPITAL_BASED_OUTPATIENT_CLINIC_OR_DEPARTMENT_OTHER): Payer: Medicare PPO | Attending: Physician Assistant | Admitting: Physician Assistant

## 2022-04-02 DIAGNOSIS — I10 Essential (primary) hypertension: Secondary | ICD-10-CM | POA: Insufficient documentation

## 2022-04-02 DIAGNOSIS — L97322 Non-pressure chronic ulcer of left ankle with fat layer exposed: Secondary | ICD-10-CM | POA: Insufficient documentation

## 2022-04-02 DIAGNOSIS — L97812 Non-pressure chronic ulcer of other part of right lower leg with fat layer exposed: Secondary | ICD-10-CM | POA: Insufficient documentation

## 2022-04-02 DIAGNOSIS — I872 Venous insufficiency (chronic) (peripheral): Secondary | ICD-10-CM | POA: Diagnosis not present

## 2022-04-02 DIAGNOSIS — L97822 Non-pressure chronic ulcer of other part of left lower leg with fat layer exposed: Secondary | ICD-10-CM | POA: Insufficient documentation

## 2022-04-02 DIAGNOSIS — Q819 Epidermolysis bullosa, unspecified: Secondary | ICD-10-CM | POA: Diagnosis not present

## 2022-04-02 DIAGNOSIS — L97312 Non-pressure chronic ulcer of right ankle with fat layer exposed: Secondary | ICD-10-CM | POA: Diagnosis not present

## 2022-04-02 DIAGNOSIS — I87312 Chronic venous hypertension (idiopathic) with ulcer of left lower extremity: Secondary | ICD-10-CM | POA: Diagnosis not present

## 2022-04-03 NOTE — Progress Notes (Signed)
SUKHDEEP, SANZONE ([id number]) [id number] Page 1 of 10 Visit Report for 04/02/2022 Arrival Information Details Patient Name: Date of Service: Carlos White 04/02/2022 12:30 PM Medical Record Number: [id number] Patient Account Number: [id number] Date of Birth/Sex: Treating RN: 1940-04-13 (82 y.o. [name], [name] Primary Care Cherity Blickenstaff: [name] Other Clinician: Referring Valin Massie: Treating Jocelynn Gioffre/Extender: [name] in Treatment: 63 Visit Information History Since Last Visit Added or deleted any medications: No Patient Arrived: Ambulatory Any new allergies or adverse reactions: No Arrival Time: 12:40 Had a fall or experienced change in No Accompanied By: self activities of daily living that may affect Transfer Assistance: None risk of falls: Patient Identification Verified: Yes Signs or symptoms of abuse/neglect since last visito No Secondary Verification Process Completed: Yes Hospitalized since last visit: No Patient Requires Transmission-Based Precautions: No Implantable device outside of the clinic excluding No Patient Has Alerts: Yes cellular tissue based products placed in the center Patient Alerts: R ABI= 1.23, TBI = .86 since last visit: L ABI= 1.28, TBI=.81 Has Dressing in Place as Prescribed: Yes Pain Present Now: No Electronic Signature(s) Signed: 04/02/2022 5:04:23 PM By: [name] RN, BSN Entered By: [name] on 04/02/2022 12:40:53 -------------------------------------------------------------------------------- Clinic Level of Care Assessment Details Patient Name: Date of Service: Carlos White 04/02/2022 12:30 PM Medical Record Number: [id number] Patient Account Number: [id number] Date of Birth/Sex: Treating RN: March 22, 1940 (82 y.o. [name] Primary Care Peta Peachey: [name] Other Clinician: Referring Holdyn Poyser: Treating Johathan Province/Extender: [name] in  Treatment: [facility] Level of Care Assessment Items TOOL 4 Quantity Score X- 1 0 Use when only an EandM is performed on FOLLOW-UP visit ASSESSMENTS - Nursing Assessment / Reassessment X- 1 10 Reassessment of Co-morbidities (includes updates in patient status) X- 1 5 Reassessment of Adherence to Treatment Plan ASSESSMENTS - Wound and Skin A ssessment / Reassessment []  - 0 Simple Wound Assessment / Reassessment - one wound X- 3 5 Complex Wound Assessment / Reassessment - multiple wounds []  - 0 Dermatologic / Skin Assessment (not related to wound area) ASSESSMENTS - Focused Assessment X- 2 5 Circumferential Edema Measurements - multi extremities []  - 0 Nutritional Assessment / Counseling / Intervention []  - 0 Lower Extremity Assessment (monofilament, tuning fork, pulses) []  - 0 Peripheral Arterial Disease Assessment (using hand held doppler) ASSESSMENTS - Ostomy and/or Continence Assessment and Care []  - 0 Incontinence Assessment and Management []  - 0 Ostomy Care Assessment and Management (repouching, etc.) PROCESS - Coordination of Care []  - 0 Simple Patient / Family Education for ongoing care URHO, MANDEL ([id number]) [id number] Page 2 of 10 X- 1 20 Complex (extensive) Patient / Family Education for ongoing care X- 1 10 Staff obtains Programmer, systems, Records, T Results / Process Orders est []  - 0 Staff telephones HHA, Nursing Homes / Clarify orders / etc []  - 0 Routine Transfer to another Facility (non-emergent condition) []  - 0 Routine Hospital Admission (non-emergent condition) []  - 0 New Admissions / Biomedical engineer / Ordering NPWT Apligraf, etc. , []  - 0 Emergency Hospital Admission (emergent condition) []  - 0 Simple Discharge Coordination X- 1 15 Complex (extensive) Discharge Coordination PROCESS - Special Needs []  - 0 Pediatric / Minor Patient Management []  - 0 Isolation Patient Management []  - 0 Hearing / Language /  Visual special needs []  - 0 Assessment of Community assistance (transportation, D/C planning, etc.) []  - 0 Additional assistance / Altered mentation []  - 0 Support Surface(s)  Assessment (bed, cushion, seat, etc.) INTERVENTIONS - Wound Cleansing / Measurement []  - 0 Simple Wound Cleansing - one wound X- 3 5 Complex Wound Cleansing - multiple wounds X- 1 5 Wound Imaging (photographs - any number of wounds) []  - 0 Wound Tracing (instead of photographs) []  - 0 Simple Wound Measurement - one wound X- 3 5 Complex Wound Measurement - multiple wounds INTERVENTIONS - Wound Dressings []  - 0 Small Wound Dressing one or multiple wounds X- 2 15 Medium Wound Dressing one or multiple wounds []  - 0 Large Wound Dressing one or multiple wounds []  - 0 Application of Medications - topical []  - 0 Application of Medications - injection INTERVENTIONS - Miscellaneous []  - 0 External ear exam []  - 0 Specimen Collection (cultures, biopsies, blood, body fluids, etc.) []  - 0 Specimen(s) / Culture(s) sent or taken to Lab for analysis []  - 0 Patient Transfer (multiple staff / Civil Service fast streamer / Similar devices) []  - 0 Simple Staple / Suture removal (25 or less) []  - 0 Complex Staple / Suture removal (26 or more) []  - 0 Hypo / Hyperglycemic Management (close monitor of Blood Glucose) []  - 0 Ankle / Brachial Index (ABI) - do not check if billed separately X- 1 5 Vital Signs Has the patient been seen at the hospital within the last three years: Yes Total Score: 155 Level Of Care: New/Established - Level 4 Electronic Signature(s) Signed: 04/02/2022 5:04:23 PM By: [name] RN, BSN Entered By: [name] on 04/02/2022 13:01:11 [name] ([id number]) [id number] Page 3 of 10 -------------------------------------------------------------------------------- Encounter Discharge Information Details Patient Name: Date of Service: Carlos White 04/02/2022 12:30  PM Medical Record Number: [id number] Patient Account Number: [id number] Date of Birth/Sex: Treating RN: [birth year] (82 y.o. [name] Primary Care Iisha Soyars: [name] Other Clinician: Referring Heavan Francom: Treating Jaley Yan/Extender: [name] in Treatment: [phone] Encounter Discharge Information Items Discharge Condition: Stable Ambulatory Status: Ambulatory Discharge Destination: Home Transportation: Private Auto Accompanied By: self Schedule Follow-up Appointment: Yes Clinical Summary of Care: Electronic Signature(s) Signed: 04/02/2022 5:04:23 PM By: [name] RN, BSN Entered By: [name] on 04/02/2022 13:01:40 -------------------------------------------------------------------------------- Lower Extremity Assessment Details Patient Name: Date of Service: Carlos White. 04/02/2022 12:30 PM Medical Record Number: [id number] Patient Account Number: [id number] Date of Birth/Sex: Treating RN: 1940/09/17 (82 y.o. [name] Primary Care Thurlow Gallaga: [name] Other Clinician: Referring Sanaz Scarlett: Treating Brandye Inthavong/Extender: [name] in Treatment: 235 Edema Assessment Assessed: [Left: Yes] Patrice Paradise: Yes] Edema: [Left: No] [Right: No] Calf Left: Right: Point of Measurement: 35.5 cm From Medial Instep 33 cm 34 cm Ankle Left: Right: Point of Measurement: 11 cm From Medial Instep 27 cm 23 cm Vascular Assessment Pulses: Dorsalis Pedis Palpable: [Left:Yes] [Right:Yes] Electronic Signature(s) Signed: 04/02/2022 5:04:23 PM By: [name] RN, BSN Entered By: [name] on 04/02/2022 12:45:10 -------------------------------------------------------------------------------- [place] Details Patient Name: Date of Service: [name], [name] 04/02/2022 12:30 PM Medical Record Number: [id number] Patient Account Number: [id number] Date of Birth/Sex: Treating RN: 1940-06-16 (82 y.o. [name] Primary Care Tamarcus Condie: [name] Other Clinician: Referring Shalin Vonbargen: Treating Shaniyah Wix/Extender: [name] in Treatment: [address], [place] ([id number]) [id number] Page 4 of 10 Multidisciplinary Care Plan reviewed with physician Active Inactive Venous Leg Ulcer Nursing Diagnoses: Knowledge deficit related to disease process and management Potential for venous Insuffiency (use before diagnosis confirmed) Goals: Patient will maintain optimal edema  control Date Initiated: 09/30/2017 Target Resolution Date: 05/16/2022 Goal Status: Active Patient/caregiver will verbalize understanding of disease process and disease management Date Initiated: 09/30/2017 Date Inactivated: 11/04/2017 Target Resolution Date: 10/30/2017 Goal Status: Met Interventions: Assess peripheral edema status every visit. Provide education on venous insufficiency Notes: Electronic Signature(s) Signed: 04/02/2022 5:04:23 PM By: [name] RN, BSN Entered By: [name] on 04/02/2022 12:52:08 -------------------------------------------------------------------------------- Pain Assessment Details Patient Name: Date of Service: [name] 04/02/2022 12:30 PM Medical Record Number: [id number] Patient Account Number: [id number] Date of Birth/Sex: Treating RN: [birth year] (82 y.o. [name] Primary Care Khizar Fiorella: [name] Other Clinician: Referring Adelai Achey: Treating Joelynn Dust/Extender: [name] in Treatment: [phone] Active Problems Location of Pain Severity and Description of Pain Patient Has Paino No Site Locations Rate the pain. Current Pain Level: 0 Pain Management and Medication Current Pain Management: Medication: No Cold Application: No Rest: No Massage: No Activity: No T.E.N.S.: No Heat Application: No Leg drop or elevation: No Is the Current Pain Management Adequate: Adequate How does your wound  impact your activities of daily TREVYON, SWOR ([id number]) [phone].pdf Page 5 of 10 Sleep: No Bathing: No Appetite: No Relationship With Others: No Bladder Continence: No Emotions: No Bowel Continence: No Work: No Toileting: No Drive: No Dressing: No Hobbies: No Engineer, maintenance) Signed: 04/02/2022 5:04:23 PM By: [name] RN, BSN Entered By: [name] on 04/02/2022 12:41:06 -------------------------------------------------------------------------------- Patient/Caregiver Education Details Patient Name: Date of Service: Carlos White 3/20/2024andnbsp12:30 PM Medical Record Number: [id number] Patient Account Number: [id number] Date of Birth/Gender: Treating RN: 07-29-1940 (81 y.o. [name] Primary Care Physician: [name] Other Clinician: Referring Physician: Treating Physician/Extender: [name] in Treatment: [phone] Education Assessment Education Provided To: Patient Education Topics Provided Wound/Skin Impairment: Handouts: Caring for Your Ulcer Methods: Explain/Verbal Responses: Reinforcements needed Electronic Signature(s) Signed: 04/02/2022 5:04:23 PM By: [name] RN, BSN Entered By: [name] on 04/02/2022 12:52:20 -------------------------------------------------------------------------------- Wound Assessment Details Patient Name: Date of Service: [name] 04/02/2022 12:30 PM Medical Record Number: [id number] Patient Account Number: [id number] Date of Birth/Sex: Treating RN: 1940-08-25 (81 y.o. [name] Primary Care Quinterrius Errington: [name] Other Clinician: Referring Kassie Keng: Treating Omair Dettmer/Extender: [name] in Treatment: 235 Wound Status Wound Number: 19 Primary Venous Leg Ulcer Etiology: Wound Location: Left, Medial Ankle Wound Status: Open Wounding Event: Gradually Appeared Comorbid Cataracts, Chronic Obstructive  Pulmonary Disease (COPD), Date Acquired: 01/15/2022 History: Hypertension Weeks Of Treatment: 10 Clustered Wound: No Photos TAKODA, JANOWIAK ([id number]) [id number] Page 6 of 10 Wound Measurements Length: (cm) 0.1 Width: (cm) 0.1 Depth: (cm) 0.1 Area: (cm) 0.008 Volume: (cm) 0.001 % Reduction in Area: 99.8% % Reduction in Volume: 99.8% Epithelialization: Large (67-100%) Tunneling: No Undermining: No Wound Description Classification: Full Thickness Without Exposed Suppor Wound Margin: Distinct, outline attached Exudate Amount: Small Exudate Type: Serosanguineous Exudate Color: red, brown t Structures Foul Odor After Cleansing: No Slough/Fibrino Yes Wound Bed Granulation Amount: Large (67-100%) Exposed Structure Granulation Quality: Red, Pink Fascia Exposed: No Necrotic Amount: Small (1-33%) Fat Layer (Subcutaneous Tissue) Exposed: Yes Necrotic Quality: Adherent Slough Tendon Exposed: No Muscle Exposed: No Joint Exposed: No Bone Exposed: No Periwound Skin Texture Texture Color No Abnormalities Noted: No No Abnormalities Noted: No Callus: No Atrophie Blanche: No Crepitus: No Cyanosis: No Excoriation: No Ecchymosis: No Induration: No Erythema: No Rash: No Hemosiderin Staining: Yes Scarring: Yes Mottled: No Pallor: No Moisture Rubor:  No No Abnormalities Noted: No Dry / Scaly: No Maceration: No Treatment Notes Wound #19 (Ankle) Wound Laterality: Left, Medial Cleanser Soap and Water Discharge Instruction: May shower and wash wound with dial antibacterial soap and water prior to dressing change. Peri-Wound Care Zinc Oxide Ointment 30g tube Discharge Instruction: Apply Zinc Oxide to periwound with each dressing change as needed for maceration Sween Lotion (Moisturizing lotion) Discharge Instruction: Apply moisturizing lotion as directed Topical bacitracin Discharge Instruction: apply three times a week. Primary  Dressing Secondary Dressing ABD Pad, 5x9 Discharge Instruction: Apply over primary dressing as directed. GARLON, KERBO ([id number]) [id number] Page 7 of 10 Secured With [employer], 4.5x3.1 (in/yd) Discharge Instruction: Secure with Kerlix as directed. Compression Wrap tubigrip D one layer Discharge Instruction: apply over the dressing from base of toes to just below the knee. Compression Stockings Add-Ons Electronic Signature(s) Signed: 04/02/2022 5:04:23 PM By: [name] RN, BSN Entered By: [name] on 04/02/2022 12:51:44 -------------------------------------------------------------------------------- Wound Assessment Details Patient Name: Date of Service: [name] 04/02/2022 12:30 PM Medical Record Number: [id number] Patient Account Number: [id number] Date of Birth/Sex: Treating RN: 1940/04/24 (83 y.o. [name] Primary Care Kendle Turbin: [name] Other Clinician: Referring Janina Trafton: Treating Daena Alper/Extender: [name] in Treatment: 235 Wound Status Wound Number: 2 Primary Venous Leg Ulcer Etiology: Wound Location: Left, Distal, Lateral Lower Leg Wound Status: Open Wounding Event: Gradually Appeared Comorbid Cataracts, Chronic Obstructive Pulmonary Disease (COPD), Date Acquired: 03/13/2017 History: Hypertension Weeks Of Treatment: 235 Clustered Wound: Yes Photos Wound Measurements Length: (cm) Width: (cm) Depth: (cm) Clustered Quantity: Area: (cm) Volume: (cm) 11 % Reduction in Area: -5.9% 9.5 % Reduction in Volume: 78.8% 0.1 Epithelialization: Medium (34-66%) 1 Tunneling: No 82.074 Undermining: No 8.207 Wound Description Classification: Full Thickness Without Exposed Sup Wound Margin: Distinct, outline attached Exudate Amount: Medium Exudate Type: Serosanguineous Exudate Color: red, brown port Structures Foul Odor After Cleansing: No Slough/Fibrino Yes Wound  Bed Granulation Amount: Large (67-100%) Exposed Structure Granulation Quality: Red Fascia Exposed: No Necrotic Amount: Small (1-33%) Fat Layer (Subcutaneous Tissue) Exposed: Yes Necrotic Quality: Adherent Slough Tendon Exposed: No Muscle Exposed: No Joint Exposed: No Bone Exposed: No GASPARD, WILDER ([id number]) [id number] Page 8 of 10 Periwound Skin Texture Texture Color No Abnormalities Noted: No No Abnormalities Noted: No Erythema: No Moisture Hemosiderin Staining: Yes No Abnormalities Noted: No Dry / Scaly: Yes Treatment Notes Wound #2 (Lower Leg) Wound Laterality: Left, Lateral, Distal Cleanser Soap and Water Discharge Instruction: May shower and wash wound with dial antibacterial soap and water prior to dressing change. Peri-Wound Care Zinc Oxide Ointment 30g tube Discharge Instruction: Apply Zinc Oxide to periwound with each dressing change as needed for maceration Sween Lotion (Moisturizing lotion) Discharge Instruction: Apply moisturizing lotion as directed Topical bacitracin Discharge Instruction: apply three times a week. Primary Dressing Secondary Dressing ABD Pad, 5x9 Discharge Instruction: Apply over primary dressing as directed. Secured With [employer], 4.5x3.1 (in/yd) Discharge Instruction: Secure with Kerlix as directed. Compression Wrap tubigrip D one layer Discharge Instruction: apply over the dressing from base of toes to just below the knee. Compression Stockings Add-Ons Electronic Signature(s) Signed: 04/02/2022 5:04:23 PM By: [name] RN, BSN Entered By: [name] on 04/02/2022 12:50:50 -------------------------------------------------------------------------------- Wound Assessment Details Patient Name: Date of Service: [name] 04/02/2022 12:30 PM Medical Record Number: [id number] Patient Account Number: [id number] Date of Birth/Sex: Treating RN: [birth year] (82 y.o. [name] Primary Care Mahealani Sulak: [name] Other  Clinician: Referring Dayrin Stallone: Treating Aahil Fredin/Extender: [name] in Treatment: 235 Wound Status Wound Number: 5 Primary Vasculopathy Etiology: Wound Location: Right, Lateral, Anterior Lower Leg Wound Status: Open Wounding Event: Gradually Appeared Comorbid Cataracts, Chronic Obstructive Pulmonary Disease (COPD), Date Acquired: 10/26/2017 History: Hypertension Weeks Of Treatment: 231 Clustered Wound: Yes Photos SABIEN, MAKAREWICZ ([id number]) [id number] Page 9 of 10 Wound Measurements Length: (cm) Width: (cm) Depth: (cm) Clustered Quantity: Area: (cm) Volume: (cm) 3 % Reduction in Area: 74% 6 % Reduction in Volume: 74% 0.1 Epithelialization: Large (67-100%) 1 Tunneling: No 14.137 Undermining: No 1.414 Wound Description Classification: Full Thickness Without Exposed Sup Wound Margin: Distinct, outline attached Exudate Amount: Medium Exudate Type: Serosanguineous Exudate Color: red, brown port Structures Foul Odor After Cleansing: No Slough/Fibrino Yes Wound Bed Granulation Amount: Large (67-100%) Exposed Structure Granulation Quality: Red Fascia Exposed: No Necrotic Amount: None Present (0%) Fat Layer (Subcutaneous Tissue) Exposed: Yes Tendon Exposed: No Muscle Exposed: No Joint Exposed: No Bone Exposed: No Periwound Skin Texture Texture Color No Abnormalities Noted: No No Abnormalities Noted: No Erythema: No Moisture Hemosiderin Staining: Yes No Abnormalities Noted: No Treatment Notes Wound #5 (Lower Leg) Wound Laterality: Right, Lateral, Anterior Cleanser Soap and Water Discharge Instruction: May shower and wash wound with dial antibacterial soap and water prior to dressing change. Peri-Wound Care Zinc Oxide Ointment 30g tube Discharge Instruction: Apply Zinc Oxide to periwound with each dressing change as needed for maceration Sween Lotion  (Moisturizing lotion) Discharge Instruction: Apply moisturizing lotion as directed Topical bacitracin Discharge Instruction: apply three times a week. Primary Dressing Secondary Dressing ABD Pad, 5x9 Discharge Instruction: Apply over primary dressing as directed. Secured With [employer], 4.5x3.1 (in/yd) Discharge Instruction: Secure with Kerlix as directed. Compression Wrap tubigrip D one layer MIKEL, CRUS ([id number]) [id number] Page 10 of 10 Discharge Instruction: apply over the dressing from base of toes to just below the knee. Compression Stockings Add-Ons Electronic Signature(s) Signed: 04/02/2022 5:04:23 PM By: [name] RN, BSN Entered By: [name] on 04/02/2022 12:51:20 -------------------------------------------------------------------------------- Vitals Details Patient Name: Date of Service: [name], GO RDO N H. 04/02/2022 12:30 PM Medical Record Number: [id number] Patient Account Number: [id number] Date of Birth/Sex: Treating RN: 10-09-1940 (82 y.o. [name] Primary Care Feleshia Zundel: [name] Other Clinician: Referring Mahonri Seiden: Treating Shaakira Borrero/Extender: [name] in Treatment: 235 Vital Signs Time Taken: 12:45 Temperature (F): 97.9 Height (in): 71 Pulse (bpm): 82 Weight (lbs): 220 Respiratory Rate (breaths/min): 16 Body Mass Index (BMI): 30.7 Blood Pressure (mmHg): 154/70 Reference Range: 80 - 120 mg / dl Electronic Signature(s) Signed: 04/02/2022 5:04:23 PM By: [name] RN, BSN Entered By: [name] on 04/02/2022 12:45:28

## 2022-04-03 NOTE — Progress Notes (Addendum)
Carlos White ([id number]) [id number] Page 1 of 12 Visit Report for 04/02/2022 Chief Complaint Document Details Patient Name: Date of Service: Carlos White 04/02/2022 12:30 PM Medical Record Number: [id number] Patient Account Number: [id number] Date of Birth/Sex: Treating RN: 10-20-1940 (82 y.o. M) Primary Care Provider: [name] Other Clinician: Referring Provider: Treating Provider/Extender: [name] in Treatment: [phone] Information Obtained from: Patient Chief Complaint Bilateral LE Ulcers Electronic Signature(s) Signed: 04/02/2022 12:50:14 PM By: Worthy Keeler PA-C Entered By: Worthy Keeler on 04/02/2022 12:50:14 -------------------------------------------------------------------------------- HPI Details Patient Name: Date of Service: Carlos White RDO Carlos White 04/02/2022 12:30 PM Medical Record Number: [id number] Patient Account Number: [id number] Date of Birth/Sex: Treating RN: 1940/11/17 (82 y.o. M) Primary Care Provider: [name] Other Clinician: Referring Provider: Treating Provider/Extender: [name] in Treatment: 235 History of Present Illness HPI Description: 09/30/17 on evaluation today patient presents for initial evaluation and our clinic concerning issues that he has been having with his bilateral lower extremities. He states this has been going on for quite some time at least six months. Currently his regiment has been mainly cleaning the area with peroxide, applying the is foreign ointment, and wrapping the area with ABD pads and then an ace wrap loosely. He has dealt with issues of this nature he tells me for quite some time. He does have a history of having had a compound fracture of the left lower extremity which he thinks also makes this a much more difficult area for him to heal. He's previously been told that he had poor vascular flow but this was years ago at [facility] we do not  have any of those records at this time. He has a history of Epidermolysis Bullosa which was diagnosed around age 19 and he has been cared for at [facility] since that time. Subsequently he states this is hereditary and two of his children one male and one male also have this as well is one of his grandchildren that he is aware of. He has no evidence of infection necessarily at this point although he does have some necrotic tissue noted on the surface of the wound as far as the largest, left lateral lower extremity ulcer, is concerned. Overall I feel like all things considered he's been taking care of this very well. Obviously he has some fairly significant issues going on at this point in this regard. He does have a history otherwise of hypertension though for the most part other than the compound fracture of the left leg he seems to have been fairly healthy in my pinion. 10/07/17 on evaluation today patient actually appears to be doing better in regard to his bilateral lower extremity ulcers. With that being said he does still have some evidence of slough noted on the surface of the wounds I think the Iodoflex has been beneficial for him. His arterial studies are scheduled for October 2. With that being said I do believe that he is continuing to show signs of good improvement which is at least good news. 10/14/17 on evaluation today patient appears to be doing very well in regard to his lower extremity ulcers. He's definitely made some progress as far as healing is concerned although there still are several open areas that are going to need to be addressed. He did have his arterial study today which fortunately shows good findings with a right ABI of 1.23 with a TBI of 0.86 in the left  ABI of 1.28 with a TBI of 0.81. This is good news and will allow [place] to perform debridement as well. 10/23/2017; patient with a large wound on the left lateral calf, sizable area on the left medial malleolus and an area on the  right lateral malleolus. He has a new blister consistent with his underlying blistering skin disease just above this area we have been using Iodoflex on the lateral left calf lateral right ankle and collagen on the medial left ankle. We have been using Kerlix Coban wraps 10/28/17 on evaluation today the patient continues to have signs of improvement in regard to the overall appearance of the original wound. Unfortunately he did have some blistering over the right lateral lower extremity which has appeared to rupture on evaluation today and likely some of the dead tissue on the surface needs to be cleaned away the good news is this does not appear to be to significantly deep at this time. 11/04/17 on evaluation today patient actually appears to be doing a little better in regard to his lower extremity ulcers. He has been tolerating the dressing changes without complication. With that being said he does still have a significant wound especially over the left lateral lower extremity unfortunately. All of the wounds pretty much are going to require sharp debridement today. 11/11/17 on evaluation today patient appears to be doing more poorly in regard to his left lower extremity in particular. There does not appear to be any evidence of systemic infection although the wound itself as far as the larger left lateral lower extremity ulcer actually appears to be infected in my pinion. There's an older and the surface of the wound is dramatically worsened compared to last week. No fevers, chills, nausea, or vomiting noted at this time. 11/18/17 upon evaluation today patient actually appears to be doing better. I did review his culture today which really did not show any specific organism is a positive reason for his wound decline. There are multiple organisms present not predominant. Nonetheless he seems to be tolerate the doxycycline well in his wounds in general do seem to be doing better. Fortunately there  does not appear to be any evidence of infection at this time which is good news. Overall I'm very pleased with how things appear. Nonetheless he still has a lot of healing to go. I do think he could benefit from a Juxta-Lite wrap. 11/25/17 on evaluation today patient actually appears to be doing fairly well in regard to his wounds. He is still taking the antibiotics he has a few days left. DARRENCE, SIEGMAN ([id number]) [id number] Page 2 of 12 Fortunately this seems to have been excellent for him as far as getting the infection control and very happy in this regard. With that being said the patient likewise is also very pleased with how things appear at this time in comparison to where we were he's not having as much pain. 12/02/17 Seen today for follow p and management of LLE wounds. Wounds appear to show some improvement. He denies pain, fever, or chills. Completed a course of doxycycline earlier this month. Scheduled to received Juxta-Lite wrap this week. No s/s of infections. 12/09/17 on evaluation today patient actually appears to be doing a little bit better in regard to his wounds. This is obscene very slow process and unfortunately he has a couple of new areas and this is due to the Epidermolysis Bullosa. Nonetheless I am concerned about the fact that he seems to be getting more areas  not less that is the reason we're gonna work on getting schedule for the vascular referral to see the venous specialist. 12/23/17 upon evaluation today patient's wounds currently shows evidence of still not doing quite as well is what I would like to have seen. Subsequently the patient did have his venous study which showed evidence of venous stasis. Subsequently I do think that a vascular evaluation for consideration of venous intervention would be appropriate. I'm not necessarily suggesting that will be anything that can be done but I think it is at least a good idea. He is in  agreement with this plan. 12/30/17 on evaluation today patient actually appears to be doing very well in regard to his wounds when compared to previous evaluation. Subsequently we have been using the [employer] [facility] Dressing which actually appears to have done excellent on his left lateral lower extremity ulcer. The quality of the wound surface is dramatically improved. There is some slight debridement that is going to be required at a couple of locations but overall I'm extremely pleased with how things appear here. 01/07/2018; this is a patient with a primary skin disorder epidermolyis bullosa. Is a large wound on the left lateral calf and smaller wounds on the right however there is a new wound on the right mid tibia area that occurred within the compression wrap that he did not change. We have been using Hydrofera Blue. On the left he is using Hydrofera Blue and Santyl to the inferior part of the wound and changing the dressing himself. 01/15/2018; primary skin disorder epidermolysis bullosa. He has several difficult wounds including the left lateral calf, smaller wounds on the left medial calf and the right lateral calf. The major area on the left lateral calf has a smaller area inferiorly that has necrotic debris we have been using Santyl to this. The rest of the wounds we have been using Hydrofera Blue. The area on the left calf actually looks larger this week. Uncontrolled edema several small open areas above it that are superficial 01/20/18 on evaluation today patient appears to be doing better as compared to last week in regard to his wounds of the bilateral lower extremities. He tolerated the bilateral compression wrap without complication. Overall I'm very pleased with how things appear at this time. The patient likewise is very happy. 01/27/18 on evaluation today patient appears to be doing decently well in regard to his bilateral lower extremity ulcers. He's been tolerating the dressing  changes without complication. One issue he had is that he did have more drainage to the left leg wrapped last week. He states in fact he probably should come in and let us change it on Friday however he just left it in place and kept adding extra absorption with ABD pads to the external portion of the wrap. Unfortunately he does have some aspiration type breakdown nothing significant but I do believe that this was probably counterproductive in general. Nonetheless his wounds do not appear to be terrible overall. 02/03/18 on evaluation today patient appears to be doing rather well in regard to his lower extremity ulcers. He has been tolerating the dressing changes without complication. He does tell me that he had to change the wrap on the left one since we last saw him. Subsequently I do not see any evidence of infection I do feel like the food was much better controlled at this point. 02/10/18 on evaluation today patient appears to be doing rather well in regard to his ulcers. He still  has significant alterations especially on the left lateral lower extremity. Fortunately there's no signs of infection at this time. Overall I feel like he is making good progress are some areas that I'm gonna attempt some debridement today. 02/17/18 on evaluation today patient appears to be doing okay in regard to his lower Trinity ulcer. It does appear on both locations he has a little bit of drainage causing some breakdown in maceration around the wound bed's although it doesn't appear to be too bad the right is a little bit worse than left. Fortunately there's no signs of infection which is good news. No fevers, chills, nausea, or vomiting noted at this time. 03/10/18 on evaluation today patient actually appears to be doing rather poorly in regard to his bilateral lower extremity ulcers. The right in particular is draining profusely and the wound is actually enlarging which is not good. I'm concerned about both possibly  infection and the fact that there's a lot of moisture which is causing breakdown as well. Unfortunately the patient has been trying to change this at home I'm afraid he may need to change more frequently in order to see the improvement that were looking for. There's no signs of systemic infection. 03/17/18 patient actually appears to be doing significantly better at this point in regard to his bilateral lower extremity ulcers. Fortunately there's no signs of infection. That is worsening infection at least indefinitely nothing systemic. With that being said he is having a lot of drainage though not quite as much is there in his last evaluation. Overall feel like he's on the side of improvement. I think if his results back from his culture which showed that he had a positive group B strep along with abundant Pseudomonas noted on the culture. For that reason I am gonna have him continue with the linezolid as we previously have ordered for him and I did go ahead as well today and prescribe Levaquin as well in order to treat the Pseudomonas portion of the infection noted. 03/24/18 on evaluation today patient actually appears to be doing very well in regard to his lower Trinity ulcer. He's been tolerating the dressing changes without complication. Fortunately both legs show signs of less drainage in his edema is very well controlled at this point as well. Overall very pleased with how things seem to be progressing. 03/31/18 on evaluation today patient actually appears to be doing excellent in regard to his bilateral lower extremity ulcers. These are not draining nearly as significant as what they were in the past overall seem to be shown signs of excellent improvement which is great news. Fortunately there is no sign of active infection at this time I do believe that the Levaquin has done extremely well for him in this regard. The patient continues to change these at home typically every day. We may be able to  slowly work towards every other day changes since the drainage seems to be slowing down quite significantly. 04/14/18 on evaluation today patient appears to be doing well in regard to his bilateral lower extremities. Let me Hilda Blades Almost completely healed which is excellent news. Fortunately he's shown signs of improvement all other sites as well with new skin growth there's some slight hyper granular tissue but for the most part this seems to be well maintained with the [facility] Dressing. I'm very happy in this regard. 04/28/18 on evaluation today patient appears to be doing rather well in regard to his ulcers of the bilateral lower extremities all  things considering. He continues to make some progress as far as new skin growth. There still some hyper granulation noted at this point despite the use of the [facility] Dressing. This is not terrible but I think we may want to consider conclude cauterization today with silver nitrate to try to help knock some of his back as well as helping with any biofilm on the surface of the wound. 05/12/18 on evaluation today patient's wounds actually appear to be doing fairly well in regard to the bilateral lower extremities. He's been tolerating the dressing changes without complication. Fortunately there's no signs of active infection at this time which is good news. Overall very pleased with how things seem to be progressing. You select silver nitrate was beneficial for him. 05/26/18 on evaluation today patient appears to be doing better in regard to left lower extremity and a little bit worse in regard to the right lower extremity. He states that he was pulling off the [facility] Dressing peel back some of the skin making this area significantly larger than what it was previous. He's not had any issues other than this and states even that hasn't caused any pain he just seems to obviously have a much larger area on the right when compared to the  previous time I saw him. No fevers, chills, nausea, or vomiting noted at this time. 06/16/18 on evaluation today patient actually appears to be doing a little better in my pinion in regard to his lower summary ulcers. He has new skin islands that seem to be spreading which is good news. Fortunately there's no signs of active infection at this time. His biggest issue is he tells me that coming as often as he does is becoming very cost prohibitive. He wonders if we can potentially spread this out. 07/14/18 on evaluation today patient appears to be doing a little bit worse in regard to his lower from the ulcer. Unfortunately he still continues to have a significant amount of drainage I think we need to do something to try to help this more. He is still somewhat reluctant to go the route of the Wound VAC SHAYE, CHUSTZ ([id number]) [id number] Page 3 of 12 although that may be the most appropriate thing for him. No fevers, chills, nausea, or vomiting noted at this time. 08/18/2018 on evaluation today patient actually appears to be doing quite well with regard to his bilateral lower extremity ulcers. I do feel like that currently he is making great progress the care max does seem to be doing a great job at helping to control the moisture he has no maceration or skin breakdown. Again this seems to be an excellent way to go. 1 thing we may want to change is adding collagen to the base of the wound and then the care max over top he is not opposed to this. 09/15/2018 on evaluation today patient appears to be doing well with regard to his bilateral lower extremity ulcers. He is showing some signs of improvement not necessarily in size but definitely in appearance. In fact he has a lot of new skin growing throughout the wounds along the edges as well as in the central portion of the wounds on both lower extremities. Overall I am extremely pleased to see this. 10/20/2018 on evaluation today  patient actually appears to be doing quite well with regard to his wounds. They are not measuring significantly smaller but he does have a lot of new epithelization noted as compared to previous.  Fortunately there is no signs of active infection at this time. No fevers, chills, nausea, vomiting, or diarrhea. 11/17/2018 on evaluation today patient presents for reevaluation concerning his bilateral lower extremity ulcers. Fortunately there is no signs of active infection at this time today. He has been tolerating the dressing changes without complication. No fevers, chills, nausea, vomiting, or diarrhea. Unfortunately in general the patient has not made as much improvement as I would like to have seen up to this point. He has been tolerating the dressing changes without complication and he does an excellent job taking care of his wounds at home in my opinion. The biggest issue I see is that he is just not making the progress that we need to be seeing currently. I think we may want to consider having him seen at a plastic surgery appointment and he has previously seen someone in years past at [facility] in [place]. That is definitely a possibility for [place] to look into at this point. 12/29/2018 on evaluation today patient appears to be doing better in regard to the overall visual appearance of his wounds which do not appear to be as macerated. He does have a much larger skin island in the middle of the left lower extremity ulcer which is doing much better. He tells me the pain is also significantly better. With that being said overall his improvement as far as the size of the wounds is not better but again these are very irregular in change shape quite often. Fortunately there is no evidence of active infection at this time which is great news. He never heard anything from [facility] regarding the plastic surgery referral that we made to them. 01/26/2019 upon evaluation today patient  appears to be doing a little better in regard to his wounds today. He has been tolerating the dressing changes again he performs these for the most part on his own. He does a great job wrapping his legs in my opinion. Unfortunately he has not been able to get down to [facility] to see if there is anything from a plastic surgery standpoint that could be done to help with his legs simply due to the fact that his wife unfortunately sustained a compression fracture in her spine she is seeing Dr. Saintclair Halsted and subsequently is going to be having what sounds to be a kyphoplasty type procedure. With that being said that has not been scheduled yet there is still waiting on an MRI the patient is very busy in fact overly busy trying to help take care of his wife at this point. I completely understand this is more of a strain on him at this time 02/23/2019 upon evaluation today patient actually appears to be making some progress. I am actually very pleased with the overall appearance of his wounds even compared to last evaluation. He seems to be doing quite well. He is taking care of his wife unfortunately she did have a compression fracture she has had the procedure for this but still she has a slow road to recovery. For that reason he still not gone to [facility] for a second opinion in this regard. Obviously the goal there was if there was anything that can be done from a skin graft standpoint or otherwise. 03/23/2019 upon evaluation today patient continues to have issues with lower extremity ulcers. Since the beginning he has made progress but at the same time the wounds unfortunately just will not close. We have been trying to get the patient to  [facility] to see a specialist there but unfortunately with the everything going on with his wife he has not been able to make that appointment time yet he states he may be able to in the next 1-2 months but is not really sure. 04/27/2019 on evaluation today  patient appears to be doing a little bit more poorly. His last evaluation. He appears to have some erythema around the edges of the wound at this point. Fortunately there is no signs of active infection at this time which is good news. No fevers, chills, nausea, vomiting, or diarrhea. 06/08/2019 on evaluation today patient appears to be doing well with regard to his wounds. Overall they are actually measuring smaller compared to the last visit last month. We did treat him for Pseudomonas as well as methicillin-resistant Staph aureus. He was only on the treatment for MRSA however for 7 days as the Cipro was resistant and subsequently we had to place him on doxycycline. Nonetheless I am thinking that we may want to add the doxycycline and just do a month-long treatment considering the longstanding nature of his wounds and see if we get this under better control. 07/13/2019 upon evaluation today patient appears to be doing fairly well in regard to his bilateral lower extremities. There does not appear to be any signs of active infection which is good news. No fevers, chills, nausea, vomiting, or diarrhea. 08/10/2019 upon evaluation today patient appears to be doing about the same with regard to his wounds in general. Unfortunately he is not significantly better although is also not significantly worse which is great news there is no evidence of active infection at this time which is good news. He still dealing with a lot going on with his wife and therefore is not really able to go see anyone at the specialty clinic at [facility] that we have previously set up still. 09/07/2019 on evaluation today patient appears to be doing well with regard to his wounds. In fact this is probably the best that have seen so far in quite a few months. Overall I am very pleased with where things stand at this time. No fevers, chills, nausea, vomiting, or diarrhea. 10/05/2019 upon evaluation today patient appears to be doing more  poorly in regard to his legs at this point. He actually is showing some signs of infection. This has been something that we seem to be back-and-forth with as far as trying to keep these wounds from becoming infected. He takes care of them very well in my opinion but nonetheless I am concerned in this regard. He tells me that his wife is still doing really about the same she is slowly getting better. Nonetheless he still spends the majority of his time helping to take care of her. 11/02/2019 upon evaluation today patient actually appears to be doing somewhat better in regard to his legs. I do believe that the compounded antibiotic treatment from [facility] has been beneficial for him. Overall I am extremely pleased with where things stand today. There is no signs of active infection at this time. The patient states he has much less drainage than he has in the past. 12/07/2019 upon evaluation today patient appears to be doing well at this time in regard to the overall appearance of his wound bed. Currently there is no signs of active infection at this time. 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 Especially when he stressed. Fortunately there is no signs of active infection locally or systemically at this point. 01/18/2020 on evaluation today patient appears to be doing well with regard to his leg ulcers. He has been tolerating the dressing changes without complication. Fortunately there is no signs of infection and overall I feel like his legs are doing about the best they have done in quite some time. There does not appear to be any evidence of active infection which is great news and overall very pleased. 02/29/2020 upon evaluation today patient's wounds actually appear to be doing quite well currently. There is no sign of active infection  at this time. No fevers, chills, nausea, vomiting, or diarrhea. 04/11/2025 on evaluation today patient appears to be doing excellent in regard to his wounds on the legs to be honest. He has made a lot of progress there does not appear to be any signs of active infection and overall I think that he is better than previous although still the wounds are quite significant obviously. In general I think that he is pleased with where things stand at this point. Still there is quite a bit of work to do here. 07/13/2020 upon evaluation today patient appears to be doing well with regard to his wound. Is been tolerating the dressing changes without complication on both legs. He just has the 2 main wounds on each leg at the ankle on the medial region of the right leg is completely healed. His wounds do seem to have gotten better during the time that he has been on bedrest as result of the pelvis fracture. Obviously does not the way that we wanted to see things improved but nonetheless he tells me that it is what it is. Fortunately he does seem to be doing better he tells me [place] KHMARI, VANDERFORD ([id number]) [id number] Page 4 of 12 to take care of them out as he and his wife while he is recovering. 08/08/2020 upon evaluation today patient appears to be doing well currently in regard to his leg ulcers. He has been tolerating the dressing changes without complication. Fortunately there is no signs of active infection at this time. No fevers, chills, nausea, vomiting, or diarrhea. 09/26/2020 upon evaluation today patient appears to be doing well with regard to his wound. He has been showing signs of good improvement which is great news and overall I am extremely pleased with where things stand today. I do think that he does well with his dressing changes and overall has really improved significantly here. segment how that how she  doing 11/14/2020 upon evaluation today patient appears to be doing well with regard to his leg ulcers. He has been tolerating the dressing changes without complication. Fortunately there does not appear to be any signs of active infection at this time. No fevers, chills, nausea, vomiting, or diarrhea. 12/26/2020 upon evaluation today patient actually appears to be making some good progress here in regard to his wounds. Fortunately I do not see any signs of active infection which is great news. No fevers, chills, nausea, vomiting, or diarrhea. 02/06/2021 upon evaluation today patient appears to be doing well with regard to his wound currently. He has been tolerating the dressing changes without complication. Fortunately there does not appear to be any evidence of active infection locally nor systemically at this time. No fevers,  chills, nausea, vomiting, or diarrhea. Overall I think that this is very slow but does seem to be getting a little bit better and a little smaller he has been through a lot of stress recently with his wife she is having a heart evaluation tomorrow to see whether or not an aortic valve replacement is something that she could tolerate and undergo. 04/03/2020 upon evaluation today patient's legs are actually showing signs of significant improvement. This is actually a dramatic overall improvement since I last saw him I think we are getting very close to closing these wounds. That is an amazing thing is we been dealing with this for quite some time. The patient is very pleased to hear things appear to be doing well as well. 05-15-2021 upon evaluation today patient appears to be doing well with regard to his wound on the bilateral lower extremities. Both are showing signs of excellent improvement which is great news. There does not appear to be any signs of active infection locally nor systemically at this time. No fevers, chills, nausea, vomiting, or diarrhea. 06-26-2021 upon evaluation  today patient appears to be doing well with regard to his wounds on the legs. They are actually showing signs of improvement in my opinion which is great news. Fortunately I do not see any evidence of active infection locally or systemically which is great news. No fevers, chills, nausea, vomiting, or diarrhea. 08-21-2021 upon evaluation today patient appears to be making excellent progress. Has been using a mixture of topical antibiotic ointment followed by the ABD pads which does seem to be doing quite well. I do not see any evidence of active infection locally or systemically at this time which is great news. With that being said I think that he is very close to complete resolution. 10-09-2021 upon evaluation today patient appears to be doing excellent in regard to his wound. In fact both legs are actually looking well. He has a lot of new skin very few openings remaining at this point. Fortunately I do not see any signs of active infection locally or systemically at this time. 11-27-2021 upon evaluation today patient appears to be doing well currently in regard to his wounds. He has been tolerating the dressing changes without complication. Fortunately there does not appear to be any signs of active infection at this time which is great news. No fevers, chills, nausea, vomiting, or diarrhea. 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 01-22-2022 upon evaluation today patient appears to be doing well currently in regard to his wounds he is actually showing signs of improvement compared to where he has been previous. Fortunately I do not see any signs of active infection locally nor systemically which is great news. No fevers, chills, nausea, vomiting, or diarrhea. 04-02-2022 upon evaluation today patient appears to be doing well currently in regard  to his legs. They actually appear to be doing about as well as have seen in the years to have actually been taking care of them. 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 I am very pleased in that regard. Electronic Signature(s)  Signed: 04/02/2022 1:30:14 PM By: Worthy Keeler PA-C Entered By: Worthy Keeler on 04/02/2022 13:30:14 -------------------------------------------------------------------------------- Physical Exam Details Patient Name: Date of Service: Carlos White 04/02/2022 12:30 PM Medical Record Number: [id number] Patient Account Number: [id number] Date of Birth/Sex: Treating RN: 21-Feb-1940 (82 y.o. M) Primary Care Provider: [name] Other Clinician: Referring Provider: Treating Provider/Extender: [name] in Treatment: 66 Constitutional Well-nourished and well-hydrated in no acute distress. Respiratory normal breathing without difficulty. Psychiatric this patient is able to make decisions and demonstrates good insight into disease process. Alert and Oriented x 3. pleasant and cooperative. Notes Upon inspection patient's wound bed actually showed signs of good granulation and epithelization at this point. I am actually very pleased with where things stand currently. DILYNN, ROMMEL ([id number]) [id number] Page 5 of 12 Electronic Signature(s) Signed: 04/02/2022 1:36:18 PM By: Worthy Keeler PA-C Entered By: Worthy Keeler on 04/02/2022 13:36:18 -------------------------------------------------------------------------------- Physician Orders Details Patient Name: Date of Service: Carlos White RDO Carlos White 04/02/2022 12:30 PM Medical Record Number: [id number] Patient Account Number:  [id number] Date of Birth/Sex: Treating RN: 01-24-1940 (82 y.o. 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 Bathing/ Shower/ Hygiene May shower and wash wound with soap and water. - on days that dressing is changed Edema Control - Lymphedema / SCD / Other Bilateral Lower Extremities Avoid standing for long periods of time. Exercise regularly Moisturize legs daily. Compression stocking or Garment 20-30 mm/Hg pressure to: - Juxtalite to both legs daily Wound Treatment Wound #19 - Ankle Wound Laterality: Left, Medial Cleanser: Soap and Water 3 x Per Week/30 Days Discharge Instructions: May shower and wash wound with dial antibacterial soap and water prior to dressing change. Peri-Wound Care: Zinc Oxide Ointment 30g tube 3 x Per Week/30 Days Discharge Instructions: Apply Zinc Oxide to periwound with each dressing change as needed for maceration Peri-Wound Care: Sween Lotion (Moisturizing lotion) 3 x Per Week/30 Days Discharge Instructions: Apply moisturizing lotion as directed Topical: bacitracin 3 x Per Week/30 Days Discharge Instructions: apply three times a week. Secondary Dressing: ABD Pad, 5x9 (Generic) 3 x Per Week/30  Days Discharge Instructions: Apply over primary dressing as directed. Secured With: [employer], 4.5x3.1 (in/yd) (Generic) 3 x Per Week/30 Days Discharge Instructions: Secure with Kerlix as directed. Compression Wrap: tubigrip D one layer 3 x Per Week/30 Days Discharge Instructions: apply over the dressing from base of toes to just below the knee. Wound #2 - Lower Leg Wound Laterality: Left, Lateral, Distal Cleanser: Soap and Water 3 x Per Week/30 Days Discharge Instructions: May shower and wash wound with dial antibacterial soap and water prior to dressing change. Peri-Wound Care: Zinc Oxide Ointment 30g tube 3 x Per Week/30 Days Discharge Instructions: Apply Zinc Oxide to periwound with each dressing change as needed for maceration Peri-Wound Care: Maude Leriche (  Moisturizing lotion) 3 x Per Week/30 Days Discharge Instructions: Apply moisturizing lotion as directed WYMAN, FRANTA ([id number]) [id number] Page 6 of 12 Topical: bacitracin 3 x Per Week/30 Days Discharge Instructions: apply three times a week. Secondary Dressing: ABD Pad, 5x9 (Generic) 3 x Per Week/30 Days Discharge Instructions: Apply over primary dressing as directed. Secured With: [employer], 4.5x3.1 (in/yd) (Generic) 3 x Per Week/30 Days Discharge Instructions: Secure with Kerlix as directed. Compression Wrap: tubigrip D one layer 3 x Per Week/30 Days Discharge Instructions: apply over the dressing from base of toes to just below the knee. Wound #5 - Lower Leg Wound Laterality: Right, Lateral, Anterior Cleanser: Soap and Water 3 x Per Week/30 Days Discharge Instructions: May shower and wash wound with dial antibacterial soap and water prior to dressing change. Peri-Wound Care: Zinc Oxide Ointment 30g tube 3 x Per Week/30 Days Discharge Instructions: Apply Zinc Oxide to periwound with each dressing change as needed for maceration Peri-Wound Care: Sween Lotion (Moisturizing  lotion) 3 x Per Week/30 Days Discharge Instructions: Apply moisturizing lotion as directed Topical: bacitracin 3 x Per Week/30 Days Discharge Instructions: apply three times a week. Secondary Dressing: ABD Pad, 5x9 (Generic) 3 x Per Week/30 Days Discharge Instructions: Apply over primary dressing as directed. Secured With: [employer], 4.5x3.1 (in/yd) (Generic) 3 x Per Week/30 Days Discharge Instructions: Secure with Kerlix as directed. Compression Wrap: tubigrip D one layer 3 x Per Week/30 Days Discharge Instructions: apply over the dressing from base of toes to just below the knee. Electronic Signature(s) Signed: 04/02/2022 5:04:23 PM By: [name] RN, BSN Signed: 04/02/2022 6:05:31 PM By: Worthy Keeler PA-C Entered By: [name] on 04/02/2022 12:58:07 -------------------------------------------------------------------------------- Problem List Details Patient Name: Date of Service: Carlos White RDO Carlos White 04/02/2022 12:30 PM Medical Record Number: [id number] Patient Account Number: [id number] Date of Birth/Sex: Treating RN: Feb 21, 1940 (82 y.o. 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 09/30/2017 No Yes L97.322 Non-pressure chronic ulcer of left ankle with fat layer exposed 09/30/2017 No Yes L97.822 Non-pressure chronic ulcer of other part of left lower leg with fat layer exposed9/18/2019 No Yes JAECOB, TYLKA ([id number]) [id number] Page 7 of 12 [phone] Non-pressure chronic ulcer of other part of right lower leg with fat layer 09/30/2017 No Yes exposed [place] (primary) hypertension 09/30/2017 No Yes Inactive Problems Resolved Problems Electronic  Signature(s) Signed: 04/02/2022 12:50:04 PM By: Worthy Keeler PA-C Entered By: Worthy Keeler on 04/02/2022 12:50:04 -------------------------------------------------------------------------------- Progress Note Details Patient Name: Date of Service: Carlos White RDO Carlos White. 04/02/2022 12:30 PM Medical Record Number: [id number] Patient Account Number: [id number] Date of Birth/Sex: Treating RN: 1940-07-16 (82 y.o. M) Primary Care Provider: [name] Other Clinician: Referring Provider: Treating Provider/Extender: [name] in Treatment: [phone] Subjective Chief Complaint Information obtained from Patient Bilateral LE Ulcers History of Present Illness (HPI) 09/30/17 on evaluation today patient presents for initial evaluation and our clinic concerning issues that he has been having with his bilateral lower extremities. He states this has been going on for quite some time at least six months. Currently his regiment has been mainly cleaning the area with peroxide, applying the is foreign ointment, and wrapping the area with ABD pads and then an ace wrap loosely. He has dealt  with issues of this nature he tells me for quite some time. He does have a history of having had a compound fracture of the left lower extremity which he thinks also makes this a much more difficult area for him to heal. He's previously been told that he had poor vascular flow but this was years ago at [facility] we do not have any of those records at this time. He has a history of Epidermolysis Bullosa which was diagnosed around age 30 and he has been cared for at [facility] since that time. Subsequently he states this is hereditary and two of his children one male and one male also have this as well is one of his grandchildren that he is aware of. He has no evidence of infection necessarily at this point although he does have some necrotic tissue noted on the surface of the wound as far as the largest, left  lateral lower extremity ulcer, is concerned. Overall I feel like all things considered he's been taking care of this very well. Obviously he has some fairly significant issues going on at this point in this regard. He does have a history otherwise of hypertension though for the most part other than the compound fracture of the left leg he seems to have been fairly healthy in my pinion. 10/07/17 on evaluation today patient actually appears to be doing better in regard to his bilateral lower extremity ulcers. With that being said he does still have some evidence of slough noted on the surface of the wounds I think the Iodoflex has been beneficial for him. His arterial studies are scheduled for October 2. With that being said I do believe that he is continuing to show signs of good improvement which is at least good news. 10/14/17 on evaluation today patient appears to be doing very well in regard to his lower extremity ulcers. He's definitely made some progress as far as healing is concerned although there still are several open areas that are going to need to be addressed. He did have his arterial study today which fortunately shows good findings with a right ABI of 1.23 with a TBI of 0.86 in the left ABI of 1.28 with a TBI of 0.81. This is good news and will allow [place] to perform debridement as well. 10/23/2017; patient with a large wound on the left lateral calf, sizable area on the left medial malleolus and an area on the right lateral malleolus. He has a new blister consistent with his underlying blistering skin disease just above this area we have been using Iodoflex on the lateral left calf lateral right ankle and collagen on the medial left ankle. We have been using Kerlix Coban wraps 10/28/17 on evaluation today the patient continues to have signs of improvement in regard to the overall appearance of the original wound. Unfortunately he did have some blistering over the right lateral lower  extremity which has appeared to rupture on evaluation today and likely some of the dead tissue on the surface needs to be cleaned away the good news is this does not appear to be to significantly deep at this time. 11/04/17 on evaluation today patient actually appears to be doing a little better in regard to his lower extremity ulcers. He has been tolerating the dressing changes without complication. With that being said he does still have a significant wound especially over the left lateral lower extremity unfortunately. All of the wounds pretty much are going to require sharp debridement today. 11/11/17 on  evaluation today patient appears to be doing more poorly in regard to his left lower extremity in particular. There does not appear to be any evidence of systemic infection although the wound itself as far as the larger left lateral lower extremity ulcer actually appears to be infected in my pinion. There's an older and the surface of the wound is dramatically worsened compared to last week. No fevers, chills, nausea, or vomiting noted at this time. 11/18/17 upon evaluation today patient actually appears to be doing better. I did review his culture today which really did not show any specific organism is a positive reason for his wound decline. There are multiple organisms present not predominant. Nonetheless he seems to be tolerate the doxycycline well in his wounds in general do seem to be doing better. Fortunately there does not appear to be any evidence of infection at this time which is good news. Overall I'm very pleased with how things appear. Nonetheless he still has a lot of healing to go. I do think he could benefit from a Juxta-Lite wrap. 11/25/17 on evaluation today patient actually appears to be doing fairly well in regard to his wounds. He is still taking the antibiotics he has a few days left. Fortunately this seems to have been excellent for him as far as getting the infection control  and very happy in this regard. With that being said the patient likewise is also very pleased with how things appear at this time in comparison to where we were he's not having as much pain. JACARRI, ROIGER ([id number]) [id number] Page 8 of 12 12/02/17 Seen today for follow p and management of LLE wounds. Wounds appear to show some improvement. He denies pain, fever, or chills. Completed a course of doxycycline earlier this month. Scheduled to received Juxta-Lite wrap this week. No s/s of infections. 12/09/17 on evaluation today patient actually appears to be doing a little bit better in regard to his wounds. This is obscene very slow process and unfortunately he has a couple of new areas and this is due to the Epidermolysis Bullosa. Nonetheless I am concerned about the fact that he seems to be getting more areas not less that is the reason we're gonna work on getting schedule for the vascular referral to see the venous specialist. 12/23/17 upon evaluation today patient's wounds currently shows evidence of still not doing quite as well is what I would like to have seen. Subsequently the patient did have his venous study which showed evidence of venous stasis. Subsequently I do think that a vascular evaluation for consideration of venous intervention would be appropriate. I'm not necessarily suggesting that will be anything that can be done but I think it is at least a good idea. He is in agreement with this plan. 12/30/17 on evaluation today patient actually appears to be doing very well in regard to his wounds when compared to previous evaluation. Subsequently we have been using the [facility] Dressing which actually appears to have done excellent on his left lateral lower extremity ulcer. The quality of the wound surface is dramatically improved. There is some slight debridement that is going to be required at a couple of locations but overall I'm extremely pleased  with how things appear here. 01/07/2018; this is a patient with a primary skin disorder epidermolyis bullosa. Is a large wound on the left lateral calf and smaller wounds on the right however there is a new wound on the right mid tibia area that occurred within  the compression wrap that he did not change. We have been using Hydrofera Blue. On the left he is using Hydrofera Blue and Santyl to the inferior part of the wound and changing the dressing himself. 01/15/2018; primary skin disorder epidermolysis bullosa. He has several difficult wounds including the left lateral calf, smaller wounds on the left medial calf and the right lateral calf. The major area on the left lateral calf has a smaller area inferiorly that has necrotic debris we have been using Santyl to this. The rest of the wounds we have been using Hydrofera Blue. The area on the left calf actually looks larger this week. Uncontrolled edema several small open areas above it that are superficial 01/20/18 on evaluation today patient appears to be doing better as compared to last week in regard to his wounds of the bilateral lower extremities. He tolerated the bilateral compression wrap without complication. Overall I'm very pleased with how things appear at this time. The patient likewise is very happy. 01/27/18 on evaluation today patient appears to be doing decently well in regard to his bilateral lower extremity ulcers. He's been tolerating the dressing changes without complication. One issue he had is that he did have more drainage to the left leg wrapped last week. He states in fact he probably should come in and let us change it on Friday however he just left it in place and kept adding extra absorption with ABD pads to the external portion of the wrap. Unfortunately he does have some aspiration type breakdown nothing significant but I do believe that this was probably counterproductive in general. Nonetheless his wounds do not appear to be  terrible overall. 02/03/18 on evaluation today patient appears to be doing rather well in regard to his lower extremity ulcers. He has been tolerating the dressing changes without complication. He does tell me that he had to change the wrap on the left one since we last saw him. Subsequently I do not see any evidence of infection I do feel like the food was much better controlled at this point. 02/10/18 on evaluation today patient appears to be doing rather well in regard to his ulcers. He still has significant alterations especially on the left lateral lower extremity. Fortunately there's no signs of infection at this time. Overall I feel like he is making good progress are some areas that I'm gonna attempt some debridement today. 02/17/18 on evaluation today patient appears to be doing okay in regard to his lower Trinity ulcer. It does appear on both locations he has a little bit of drainage causing some breakdown in maceration around the wound bed's although it doesn't appear to be too bad the right is a little bit worse than left. Fortunately there's no signs of infection which is good news. No fevers, chills, nausea, or vomiting noted at this time. 03/10/18 on evaluation today patient actually appears to be doing rather poorly in regard to his bilateral lower extremity ulcers. The right in particular is draining profusely and the wound is actually enlarging which is not good. I'm concerned about both possibly infection and the fact that there's a lot of moisture which is causing breakdown as well. Unfortunately the patient has been trying to change this at home I'm afraid he may need to change more frequently in order to see the improvement that were looking for. There's no signs of systemic infection. 03/17/18 patient actually appears to be doing significantly better at this point in regard to his bilateral lower extremity  ulcers. Fortunately there's no signs of infection. That is worsening infection  at least indefinitely nothing systemic. With that being said he is having a lot of drainage though not quite as much is there in his last evaluation. Overall feel like he's on the side of improvement. I think if his results back from his culture which showed that he had a positive group B strep along with abundant Pseudomonas noted on the culture. For that reason I am gonna have him continue with the linezolid as we previously have ordered for him and I did go ahead as well today and prescribe Levaquin as well in order to treat the Pseudomonas portion of the infection noted. 03/24/18 on evaluation today patient actually appears to be doing very well in regard to his lower Trinity ulcer. He's been tolerating the dressing changes without complication. Fortunately both legs show signs of less drainage in his edema is very well controlled at this point as well. Overall very pleased with how things seem to be progressing. 03/31/18 on evaluation today patient actually appears to be doing excellent in regard to his bilateral lower extremity ulcers. These are not draining nearly as significant as what they were in the past overall seem to be shown signs of excellent improvement which is great news. Fortunately there is no sign of active infection at this time I do believe that the Levaquin has done extremely well for him in this regard. The patient continues to change these at home typically every day. We may be able to slowly work towards every other day changes since the drainage seems to be slowing down quite significantly. 04/14/18 on evaluation today patient appears to be doing well in regard to his bilateral lower extremities. Let me [facility] Almost completely healed which is excellent news. Fortunately he's shown signs of improvement all other sites as well with new skin growth there's some slight hyper granular tissue but for the most part this seems to be well maintained with the [facility]  Dressing. I'm very happy in this regard. 04/28/18 on evaluation today patient appears to be doing rather well in regard to his ulcers of the bilateral lower extremities all things considering. He continues to make some progress as far as new skin growth. There still some hyper granulation noted at this point despite the use of the [facility] Dressing. This is not terrible but I think we may want to consider conclude cauterization today with silver nitrate to try to help knock some of his back as well as helping with any biofilm on the surface of the wound. 05/12/18 on evaluation today patient's wounds actually appear to be doing fairly well in regard to the bilateral lower extremities. He's been tolerating the dressing changes without complication. Fortunately there's no signs of active infection at this time which is good news. Overall very pleased with how things seem to be progressing. You select silver nitrate was beneficial for him. 05/26/18 on evaluation today patient appears to be doing better in regard to left lower extremity and a little bit worse in regard to the right lower extremity. He states that he was pulling off the [facility] Dressing peel back some of the skin making this area significantly larger than what it was previous. He's not had any issues other than this and states even that hasn't caused any pain he just seems to obviously have a much larger area on the right when compared to the previous time I saw him. No fevers, chills,  nausea, or vomiting noted at this time. 06/16/18 on evaluation today patient actually appears to be doing a little better in my pinion in regard to his lower summary ulcers. He has new skin islands that seem to be spreading which is good news. Fortunately there's no signs of active infection at this time. His biggest issue is he tells me that coming as often as he does is becoming very cost prohibitive. He wonders if we can potentially spread this  out. 07/14/18 on evaluation today patient appears to be doing a little bit worse in regard to his lower from the ulcer. Unfortunately he still continues to have a significant amount of drainage I think we need to do something to try to help this more. He is still somewhat reluctant to go the route of the Wound VAC although that may be the most appropriate thing for him. No fevers, chills, nausea, or vomiting noted at this time. 08/18/2018 on evaluation today patient actually appears to be doing quite well with regard to his bilateral lower extremity ulcers. I do feel like that currently he is CAYO, BRAMBLETT ([id number]) [id number] Page 9 of 12 making great progress the care max does seem to be doing a great job at helping to control the moisture he has no maceration or skin breakdown. Again this seems to be an excellent way to go. 1 thing we may want to change is adding collagen to the base of the wound and then the care max over top he is not opposed to this. 09/15/2018 on evaluation today patient appears to be doing well with regard to his bilateral lower extremity ulcers. He is showing some signs of improvement not necessarily in size but definitely in appearance. In fact he has a lot of new skin growing throughout the wounds along the edges as well as in the central portion of the wounds on both lower extremities. Overall I am extremely pleased to see this. 10/20/2018 on evaluation today patient actually appears to be doing quite well with regard to his wounds. They are not measuring significantly smaller but he does have a lot of new epithelization noted as compared to previous. Fortunately there is no signs of active infection at this time. No fevers, chills, nausea, vomiting, or diarrhea. 11/17/2018 on evaluation today patient presents for reevaluation concerning his bilateral lower extremity ulcers. Fortunately there is no signs of active infection at this time today. He  has been tolerating the dressing changes without complication. No fevers, chills, nausea, vomiting, or diarrhea. Unfortunately in general the patient has not made as much improvement as I would like to have seen up to this point. He has been tolerating the dressing changes without complication and he does an excellent job taking care of his wounds at home in my opinion. The biggest issue I see is that he is just not making the progress that we need to be seeing currently. I think we may want to consider having him seen at a plastic surgery appointment and he has previously seen someone in years past at [facility] in [place]. That is definitely a possibility for [place] to look into at this point. 12/29/2018 on evaluation today patient appears to be doing better in regard to the overall visual appearance of his wounds which do not appear to be as macerated. He does have a much larger skin island in the middle of the left lower extremity ulcer which is doing much better. He tells me the pain  is also significantly better. With that being said overall his improvement as far as the size of the wounds is not better but again these are very irregular in change shape quite often. Fortunately there is no evidence of active infection at this time which is great news. He never heard anything from [facility] regarding the plastic surgery referral that we made to them. 01/26/2019 upon evaluation today patient appears to be doing a little better in regard to his wounds today. He has been tolerating the dressing changes again he performs these for the most part on his own. He does a great job wrapping his legs in my opinion. Unfortunately he has not been able to get down to [facility] to see if there is anything from a plastic surgery standpoint that could be done to help with his legs simply due to the fact that his wife unfortunately sustained a compression fracture in her spine she is seeing Dr. Saintclair Halsted and  subsequently is going to be having what sounds to be a kyphoplasty type procedure. With that being said that has not been scheduled yet there is still waiting on an MRI the patient is very busy in fact overly busy trying to help take care of his wife at this point. I completely understand this is more of a strain on him at this time 02/23/2019 upon evaluation today patient actually appears to be making some progress. I am actually very pleased with the overall appearance of his wounds even compared to last evaluation. He seems to be doing quite well. He is taking care of his wife unfortunately she did have a compression fracture she has had the procedure for this but still she has a slow road to recovery. For that reason he still not gone to [facility] for a second opinion in this regard. Obviously the goal there was if there was anything that can be done from a skin graft standpoint or otherwise. 03/23/2019 upon evaluation today patient continues to have issues with lower extremity ulcers. Since the beginning he has made progress but at the same time the wounds unfortunately just will not close. We have been trying to get the patient to [facility] to see a specialist there but unfortunately with the everything going on with his wife he has not been able to make that appointment time yet he states he may be able to in the next 1-2 months but is not really sure. 04/27/2019 on evaluation today patient appears to be doing a little bit more poorly. His last evaluation. He appears to have some erythema around the edges of the wound at this point. Fortunately there is no signs of active infection at this time which is good news. No fevers, chills, nausea, vomiting, or diarrhea. 06/08/2019 on evaluation today patient appears to be doing well with regard to his wounds. Overall they are actually measuring smaller compared to the last visit last month. We did treat him for Pseudomonas as well as  methicillin-resistant Staph aureus. He was only on the treatment for MRSA however for 7 days as the Cipro was resistant and subsequently we had to place him on doxycycline. Nonetheless I am thinking that we may want to add the doxycycline and just do a month-long treatment considering the longstanding nature of his wounds and see if we get this under better control. 07/13/2019 upon evaluation today patient appears to be doing fairly well in regard to his bilateral lower extremities. There does not appear to be  any signs of active infection which is good news. No fevers, chills, nausea, vomiting, or diarrhea. 08/10/2019 upon evaluation today patient appears to be doing about the same with regard to his wounds in general. Unfortunately he is not significantly better although is also not significantly worse which is great news there is no evidence of active infection at this time which is good news. He still dealing with a lot going on with his wife and therefore is not really able to go see anyone at the specialty clinic at [facility] that we have previously set up still. 09/07/2019 on evaluation today patient appears to be doing well with regard to his wounds. In fact this is probably the best that have seen so far in quite a few months. Overall I am very pleased with where things stand at this time. No fevers, chills, nausea, vomiting, or diarrhea. 10/05/2019 upon evaluation today patient appears to be doing more poorly in regard to his legs at this point. He actually is showing some signs of infection. This has been something that we seem to be back-and-forth with as far as trying to keep these wounds from becoming infected. He takes care of them very well in my opinion but nonetheless I am concerned in this regard. He tells me that his wife is still doing really about the same she is slowly getting better. Nonetheless he still spends the majority of his time helping to take care of her. 11/02/2019 upon  evaluation today patient actually appears to be doing somewhat better in regard to his legs. I do believe that the compounded antibiotic treatment from [facility] has been beneficial for him. Overall I am extremely pleased with where things stand today. There is no signs of active infection at this time. The patient states he has much less drainage than he has in the past. 12/07/2019 upon evaluation today patient appears to be doing well at this time in regard to the overall appearance of his wound bed. Currently there is no signs of active infection at this time. 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 Especially when he stressed. Fortunately there is no signs of active infection locally or systemically at this point. 01/18/2020 on evaluation today patient appears to be doing well with regard to his leg ulcers. He has been tolerating the dressing changes without complication. Fortunately there is no signs of infection and overall I feel like his legs are doing about the best they have done in quite some time. There does not appear to be any evidence of active infection which is great news and overall very pleased. 02/29/2020 upon evaluation today patient's wounds actually appear to be doing quite well currently. There is no sign of active infection at this time. No fevers, chills, nausea, vomiting, or diarrhea. 04/11/2025 on evaluation today patient appears to be doing excellent in regard to his wounds on the legs to be honest. He has made a lot of progress there does not appear to be any signs of active infection and overall I think that he is better than previous although still the wounds are quite significant obviously. In general I think that he is pleased with where things stand at this point. Still there is quite a bit of work to do  here. 07/13/2020 upon evaluation today patient appears to  be doing well with regard to his wound. Is been tolerating the dressing changes without complication on both legs. He just has the 2 main wounds on each leg at the ankle on the medial region of the right leg is completely healed. His wounds do seem to have gotten better during the time that he has been on bedrest as result of the pelvis fracture. Obviously does not the way that we wanted to see things improved but nonetheless he tells me that it is what it is. Fortunately he does seem to be doing better he tells me that her daughter from the [place] is actually down helping to take care of them out as he and his wife while he is recovering. 08/08/2020 upon evaluation today patient appears to be doing well currently in regard to his leg ulcers. He has been tolerating the dressing changes without SAXTON, SWIERK ([id number]) [phone] Page 10 of 12 complication. Fortunately there is no signs of active infection at this time. No fevers, chills, nausea, vomiting, or diarrhea. 09/26/2020 upon evaluation today patient appears to be doing well with regard to his wound. He has been showing signs of good improvement which is great news and overall I am extremely pleased with where things stand today. I do think that he does well with his dressing changes and overall has really improved significantly here. segment how that how she doing 11/14/2020 upon evaluation today patient appears to be doing well with regard to his leg ulcers. He has been tolerating the dressing changes without complication. Fortunately there does not appear to be any signs of active infection at this time. No fevers, chills, nausea, vomiting, or diarrhea. 12/26/2020 upon evaluation today patient actually appears to be making some good progress here in regard to his wounds. Fortunately I do not see any signs of active infection which is great news. No fevers,  chills, nausea, vomiting, or diarrhea. 02/06/2021 upon evaluation today patient appears to be doing well with regard to his wound currently. He has been tolerating the dressing changes without complication. Fortunately there does not appear to be any evidence of active infection locally nor systemically at this time. No fevers, chills, nausea, vomiting, or diarrhea. Overall I think that this is very slow but does seem to be getting a little bit better and a little smaller he has been through a lot of stress recently with his wife she is having a heart evaluation tomorrow to see whether or not an aortic valve replacement is something that she could tolerate and undergo. 04/03/2020 upon evaluation today patient's legs are actually showing signs of significant improvement. This is actually a dramatic overall improvement since I last saw him I think we are getting very close to closing these wounds. That is an amazing thing is we been dealing with this for quite some time. The patient is very pleased to hear things appear to be doing well as well. 05-15-2021 upon evaluation today patient appears to be doing well with regard to his wound on the bilateral lower extremities. Both are showing signs of excellent improvement which is great news. There does not appear to be any signs of active infection locally nor systemically at this time. No fevers, chills, nausea, vomiting, or diarrhea. 06-26-2021 upon evaluation today patient appears to be doing well with regard to his wounds on the legs. They are actually showing signs of improvement in my opinion which is great news. Fortunately I do not see any evidence of active infection locally  or systemically which is great news. No fevers, chills, nausea, vomiting, or diarrhea. 08-21-2021 upon evaluation today patient appears to be making excellent progress. Has been using a mixture of topical antibiotic ointment followed by the ABD pads which does seem to be doing quite  well. I do not see any evidence of active infection locally or systemically at this time which is great news. With that being said I think that he is very close to complete resolution. 10-09-2021 upon evaluation today patient appears to be doing excellent in regard to his wound. In fact both legs are actually looking well. He has a lot of new skin very few openings remaining at this point. Fortunately I do not see any signs of active infection locally or systemically at this time. 11-27-2021 upon evaluation today patient appears to be doing well currently in regard to his wounds. He has been tolerating the dressing changes without complication. Fortunately there does not appear to be any signs of active infection at this time which is great news. No fevers, chills, nausea, vomiting, or diarrhea. 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 01-22-2022 upon evaluation today patient appears to be doing well currently in regard to his wounds he is actually showing signs of improvement compared to where he has been previous. Fortunately I do not see any signs of active infection locally nor systemically which is great news. No fevers, chills, nausea, vomiting, or diarrhea. 04-02-2022 upon evaluation today patient appears to be doing well currently in regard to his legs. They actually appear to be doing about as well as have seen in the years to have actually been taking care of them. 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 I am very pleased in that regard. Objective Constitutional Well-nourished and well-hydrated in no acute  distress. Vitals Time Taken: 12:45 PM, Height: 71 in, Weight: 220 lbs, BMI: 30.7, Temperature: 97.9 F, Pulse: 82 bpm, Respiratory Rate: 16 breaths/min, Blood Pressure: 154/70 mmHg. Respiratory normal breathing without difficulty. Psychiatric this patient is able to make decisions and demonstrates good insight into disease process. Alert and Oriented x 3. pleasant and cooperative. General Notes: Upon inspection patient's wound bed actually showed signs of good granulation and epithelization at this point. I am actually very pleased with where things stand currently. Integumentary (Hair, Skin) Wound #19 status is Open. Original cause of wound was Gradually Appeared. The date acquired was: 01/15/2022. The wound has been in treatment 10 weeks. The wound is located on the Left,Medial Ankle. The wound measures 0.1cm length x 0.1cm width x 0.1cm depth; 0.008cm^2 area and 0.001cm^3 volume. There is Fat Layer (Subcutaneous Tissue) exposed. There is no tunneling or undermining noted. There is a small amount of serosanguineous drainage noted. The wound margin is distinct with the outline attached to the wound base. There is large (67-100%) red, pink granulation within the wound bed. There is a small (1- 33%) amount of necrotic tissue within the wound bed including Adherent Slough. The periwound skin appearance exhibited: Scarring, Hemosiderin Staining. The periwound skin appearance did not exhibit: Callus, Crepitus, Excoriation, Induration, Rash, Dry/Scaly,  Maceration, Atrophie Blanche, Cyanosis, Ecchymosis, Mottled, Pallor, Rubor, Erythema. Wound #2 status is Open. Original cause of wound was Gradually Appeared. The date acquired was: 03/13/2017. The wound has been in treatment 235 weeks. The wound is located on the Left,Distal,Lateral Lower Leg. The wound measures 11cm length x 9.5cm width x 0.1cm depth; 82.074cm^2 area and 8.207cm^3 volume. There is Fat Layer (Subcutaneous Tissue) exposed. There is no  tunneling or undermining noted. There is a medium amount of serosanguineous drainage noted. The wound margin is distinct with the outline attached to the wound base. There is large (67-100%) red granulation within the wound bed. There is MCKADEN, DELON ([id number]) [id number] Page 11 of 12 a small (1-33%) amount of necrotic tissue within the wound bed including Adherent Slough. The periwound skin appearance exhibited: Dry/Scaly, Hemosiderin Staining. The periwound skin appearance did not exhibit: Erythema. Wound #5 status is Open. Original cause of wound was Gradually Appeared. The date acquired was: 10/26/2017. The wound has been in treatment 231 weeks. The wound is located on the Right,Lateral,Anterior Lower Leg. The wound measures 3cm length x 6cm width x 0.1cm depth; 14.137cm^2 area and 1.414cm^3 volume. There is Fat Layer (Subcutaneous Tissue) exposed. There is no tunneling or undermining noted. There is a medium amount of serosanguineous drainage noted. The wound margin is distinct with the outline attached to the wound base. There is large (67-100%) red granulation within the wound bed. There is no necrotic tissue within the wound bed. The periwound skin appearance exhibited: Hemosiderin Staining. The periwound skin appearance did not exhibit: Erythema. 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 Bathing/ Shower/ Hygiene: May shower and  wash wound with soap and water. - on days that dressing is changed Edema Control - Lymphedema / SCD / Other: Avoid standing for long periods of time. Exercise regularly Moisturize legs daily. Compression stocking or Garment 20-30 mm/Hg pressure to: - Juxtalite to both legs daily WOUND #19: - Ankle Wound Laterality: Left, Medial Cleanser: Soap and Water 3 x Per Week/30 Days Discharge Instructions: May shower and wash wound with dial antibacterial soap and water prior to dressing change. Peri-Wound Care: Zinc Oxide Ointment 30g tube 3 x Per Week/30 Days Discharge Instructions: Apply Zinc Oxide to periwound with each dressing change as needed for maceration Peri-Wound Care: Sween Lotion (Moisturizing lotion) 3 x Per Week/30 Days Discharge Instructions: Apply moisturizing lotion as directed Topical: bacitracin 3 x Per Week/30 Days Discharge Instructions: apply three times a week. Secondary Dressing: ABD Pad, 5x9 (Generic) 3 x Per Week/30 Days Discharge Instructions: Apply over primary dressing as directed. Secured With: [employer], 4.5x3.1 (in/yd) (Generic) 3 x Per Week/30 Days Discharge Instructions: Secure with Kerlix as directed. Com pression Wrap: tubigrip D one layer 3 x Per Week/30 Days Discharge Instructions: apply over the dressing from base of toes to just below the knee. WOUND #2: - Lower Leg Wound Laterality: Left, Lateral, Distal Cleanser: Soap and Water 3 x Per Week/30 Days Discharge Instructions: May shower and wash wound with dial antibacterial soap and water prior to dressing change. Peri-Wound Care:  Zinc Oxide Ointment 30g tube 3 x Per Week/30 Days Discharge Instructions: Apply Zinc Oxide to periwound with each dressing change as needed for maceration Peri-Wound Care: Sween Lotion (Moisturizing lotion) 3 x Per Week/30 Days Discharge Instructions: Apply moisturizing lotion as directed Topical: bacitracin 3 x Per Week/30 Days Discharge Instructions: apply three times  a week. Secondary Dressing: ABD Pad, 5x9 (Generic) 3 x Per Week/30 Days Discharge Instructions: Apply over primary dressing as directed. Secured With: [employer], 4.5x3.1 (in/yd) (Generic) 3 x Per Week/30 Days Discharge Instructions: Secure with Kerlix as directed. Com pression Wrap: tubigrip D one layer 3 x Per Week/30 Days Discharge Instructions: apply over the dressing from base of toes to just below the knee. WOUND #5: - Lower Leg Wound Laterality: Right, Lateral, Anterior Cleanser: Soap and Water 3 x Per Week/30 Days Discharge Instructions: May shower and wash wound with dial antibacterial soap and water prior to dressing change. Peri-Wound Care: Zinc Oxide Ointment 30g tube 3 x Per Week/30 Days Discharge Instructions: Apply Zinc Oxide to periwound with each dressing change as needed for maceration Peri-Wound Care: Sween Lotion (Moisturizing lotion) 3 x Per Week/30 Days Discharge Instructions: Apply moisturizing lotion as directed Topical: bacitracin 3 x Per Week/30 Days Discharge Instructions: apply three times a week. Secondary Dressing: ABD Pad, 5x9 (Generic) 3 x Per Week/30 Days Discharge Instructions: Apply over primary dressing as directed. Secured With: [employer], 4.5x3.1 (in/yd) (Generic) 3 x Per Week/30 Days Discharge Instructions: Secure with Kerlix as directed. Com pression Wrap: tubigrip D one layer 3 x Per Week/30 Days Discharge Instructions: apply over the dressing from base of toes to just below the knee. MARKO, MERMELSTEIN ([id number]) [id number] Page 12 of 12 1. Based on what I am seeing I am going to recommend that we have the patient continue to monitor for any signs of infection or worsening. Overall I do believe that we are headed in the right direction for the most part. 2. I would recommend as well that we have the patient continue with the Neosporin he is using this and he is also liking the Tubigrip which she states is  much more effective than just doing the Ace wraps. He is very pleased in that regard. 3. Using ABD pads over top of Neosporin. We will see patient back for reevaluation in 2 months here in the clinic. If anything worsens or changes patient will contact our office for additional recommendations. Electronic Signature(s) Signed: 04/02/2022 1:38:16 PM By: Worthy Keeler PA-C Entered By: Worthy Keeler on 04/02/2022 13:38:15 -------------------------------------------------------------------------------- SuperBill Details Patient Name: Date of Service: Carlos White 04/02/2022 Medical Record Number: [id number] Patient Account Number: [id number] Date of Birth/Sex: Treating RN: [birth year] (82 y.o. [name] Primary Care Provider: [name] Other Clinician: Referring Provider: Treating Provider/Extender: [name] in Treatment: 235 Diagnosis Coding ICD-10 Codes Code Description I87.2 Venous insufficiency (chronic) (peripheral) Q81.8 Other epidermolysis bullosa L97.322 Non-pressure chronic ulcer of left ankle with fat layer exposed L97.822 Non-pressure chronic ulcer of other part of left lower leg with fat layer exposed L97.812 Non-pressure chronic ulcer of other part of right lower leg with fat layer exposed [place] (primary) hypertension Facility Procedures : CPT4 Code: [id number] Description: 99214 - WOUND CARE VISIT-LEV 4 EST PT Modifier: Quantity: 1 Physician Procedures : CPT4 Code Description Modifier [id number] - WC PHYS LEVEL 3 - EST PT ICD-10 Diagnosis Description I87.2 Venous insufficiency (chronic) (peripheral) Q81.8 Other epidermolysis bullosa L97.322  Non-pressure chronic ulcer of left ankle with fat layer  exposed L97.822 Non-pressure chronic ulcer of other part of left lower leg with fat layer exposed Quantity: 1 Electronic Signature(s) Signed: 04/02/2022 1:38:35 PM By: Worthy Keeler PA-C Entered By: Worthy Keeler on  04/02/2022 13:38:35

## 2022-04-22 ENCOUNTER — Other Ambulatory Visit: Payer: Self-pay | Admitting: Internal Medicine

## 2022-05-27 DIAGNOSIS — L0100 Impetigo, unspecified: Secondary | ICD-10-CM | POA: Diagnosis not present

## 2022-05-28 ENCOUNTER — Other Ambulatory Visit: Payer: Self-pay | Admitting: Internal Medicine

## 2022-06-04 ENCOUNTER — Encounter (HOSPITAL_BASED_OUTPATIENT_CLINIC_OR_DEPARTMENT_OTHER): Payer: Medicare PPO | Admitting: Physician Assistant

## 2022-06-04 ENCOUNTER — Encounter (HOSPITAL_BASED_OUTPATIENT_CLINIC_OR_DEPARTMENT_OTHER): Payer: Medicare PPO | Attending: Physician Assistant | Admitting: Physician Assistant

## 2022-06-04 DIAGNOSIS — L97822 Non-pressure chronic ulcer of other part of left lower leg with fat layer exposed: Secondary | ICD-10-CM | POA: Insufficient documentation

## 2022-06-04 DIAGNOSIS — I10 Essential (primary) hypertension: Secondary | ICD-10-CM | POA: Diagnosis not present

## 2022-06-04 DIAGNOSIS — I872 Venous insufficiency (chronic) (peripheral): Secondary | ICD-10-CM | POA: Insufficient documentation

## 2022-06-04 DIAGNOSIS — I87312 Chronic venous hypertension (idiopathic) with ulcer of left lower extremity: Secondary | ICD-10-CM | POA: Diagnosis not present

## 2022-06-04 DIAGNOSIS — L97812 Non-pressure chronic ulcer of other part of right lower leg with fat layer exposed: Secondary | ICD-10-CM | POA: Diagnosis not present

## 2022-06-04 DIAGNOSIS — Q818 Other epidermolysis bullosa: Secondary | ICD-10-CM | POA: Insufficient documentation

## 2022-06-04 DIAGNOSIS — L97322 Non-pressure chronic ulcer of left ankle with fat layer exposed: Secondary | ICD-10-CM | POA: Insufficient documentation

## 2022-06-17 DIAGNOSIS — L820 Inflamed seborrheic keratosis: Secondary | ICD-10-CM | POA: Diagnosis not present

## 2022-06-17 DIAGNOSIS — C44311 Basal cell carcinoma of skin of nose: Secondary | ICD-10-CM | POA: Diagnosis not present

## 2022-06-17 DIAGNOSIS — C44319 Basal cell carcinoma of skin of other parts of face: Secondary | ICD-10-CM | POA: Diagnosis not present

## 2022-07-03 ENCOUNTER — Other Ambulatory Visit: Payer: Self-pay | Admitting: Internal Medicine

## 2022-07-22 DIAGNOSIS — H353132 Nonexudative age-related macular degeneration, bilateral, intermediate dry stage: Secondary | ICD-10-CM | POA: Diagnosis not present

## 2022-07-22 DIAGNOSIS — H524 Presbyopia: Secondary | ICD-10-CM | POA: Diagnosis not present

## 2022-07-22 DIAGNOSIS — H02882 Meibomian gland dysfunction right lower eyelid: Secondary | ICD-10-CM | POA: Diagnosis not present

## 2022-07-22 DIAGNOSIS — H35033 Hypertensive retinopathy, bilateral: Secondary | ICD-10-CM | POA: Diagnosis not present

## 2022-07-22 DIAGNOSIS — H35363 Drusen (degenerative) of macula, bilateral: Secondary | ICD-10-CM | POA: Diagnosis not present

## 2022-07-22 LAB — HM DIABETES EYE EXAM

## 2022-07-24 ENCOUNTER — Encounter: Payer: Self-pay | Admitting: Internal Medicine

## 2022-07-27 ENCOUNTER — Other Ambulatory Visit: Payer: Self-pay | Admitting: Internal Medicine

## 2022-07-28 ENCOUNTER — Telehealth: Payer: Self-pay | Admitting: Internal Medicine

## 2022-07-28 NOTE — Telephone Encounter (Signed)
Carlos White [id number]  [name] called about his refill on below medication, he said he is about out and needs a refill.  traMADol (ULTRAM) 50 MG tablet   [id number] [place], [place] - [address] Phone: [phone]  Fax: [phone]

## 2022-07-28 NOTE — Telephone Encounter (Signed)
LVM that medication could be refilled on Friday

## 2022-07-29 DIAGNOSIS — C44311 Basal cell carcinoma of skin of nose: Secondary | ICD-10-CM | POA: Diagnosis not present

## 2022-07-29 DIAGNOSIS — C44319 Basal cell carcinoma of skin of other parts of face: Secondary | ICD-10-CM | POA: Diagnosis not present

## 2022-07-29 DIAGNOSIS — X32XXXD Exposure to sunlight, subsequent encounter: Secondary | ICD-10-CM | POA: Diagnosis not present

## 2022-07-29 DIAGNOSIS — L570 Actinic keratosis: Secondary | ICD-10-CM | POA: Diagnosis not present

## 2022-07-30 ENCOUNTER — Encounter (HOSPITAL_BASED_OUTPATIENT_CLINIC_OR_DEPARTMENT_OTHER): Payer: Medicare PPO | Attending: Physician Assistant | Admitting: Physician Assistant

## 2022-07-30 DIAGNOSIS — I872 Venous insufficiency (chronic) (peripheral): Secondary | ICD-10-CM | POA: Diagnosis not present

## 2022-07-30 DIAGNOSIS — I10 Essential (primary) hypertension: Secondary | ICD-10-CM | POA: Diagnosis not present

## 2022-07-30 DIAGNOSIS — L97812 Non-pressure chronic ulcer of other part of right lower leg with fat layer exposed: Secondary | ICD-10-CM | POA: Diagnosis not present

## 2022-07-30 DIAGNOSIS — L97822 Non-pressure chronic ulcer of other part of left lower leg with fat layer exposed: Secondary | ICD-10-CM | POA: Insufficient documentation

## 2022-07-30 DIAGNOSIS — L97322 Non-pressure chronic ulcer of left ankle with fat layer exposed: Secondary | ICD-10-CM | POA: Diagnosis not present

## 2022-07-30 DIAGNOSIS — Q818 Other epidermolysis bullosa: Secondary | ICD-10-CM | POA: Insufficient documentation

## 2022-07-30 DIAGNOSIS — I87312 Chronic venous hypertension (idiopathic) with ulcer of left lower extremity: Secondary | ICD-10-CM | POA: Diagnosis not present

## 2022-07-30 NOTE — Progress Notes (Signed)
JAVARIOUS, ELSAYED ([id number]) [id number] Page 1 of 9 Visit Report for 07/30/2022 Arrival Information Details Patient Name: Date of Service: Carlos White 07/30/2022 2:15 PM Medical Record Number: [id number] Patient Account Number: [id number] Date of Birth/Sex: Treating RN: October 30, 1940 (82 y.o. Carlos White, Millard.Loa Primary Care Wilmot Quevedo: Marlan Palau Other Clinician: Referring Mya Suell: Treating Yuka Lallier/Extender: Karen Kays in Treatment: 252 Visit Information History Since Last Visit Added or deleted any medications: No Patient Arrived: Ambulatory Any new allergies or adverse reactions: No Arrival Time: 14:04 Had a fall or experienced change in No Accompanied By: self activities of daily living that may affect Transfer Assistance: None risk of falls: Patient Identification Verified: Yes Signs or symptoms of abuse/neglect since last visito No Secondary Verification Process Completed: Yes Hospitalized since last visit: No Patient Requires Transmission-Based Precautions: No Implantable device outside of the clinic excluding No Patient Has Alerts: Yes cellular tissue based products placed in the center Patient Alerts: R ABI= 1.23, TBI = .86 since last visit: L ABI= 1.28, TBI=.81 Has Dressing in Place as Prescribed: Yes Pain Present Now: Yes Electronic Signature(s) Signed: 07/30/2022 5:02:36 PM By: Shawn Stall RN, BSN Entered By: Shawn Stall on 07/30/2022 14:05:02 -------------------------------------------------------------------------------- Clinic Level of Care Assessment Details Patient Name: Date of Service: Carlos White 07/30/2022 2:15 PM Medical Record Number: [id number] Patient Account Number: [id number] Date of Birth/Sex: Treating RN: 05-05-1940 (81 y.o. Carlos White Primary Care Josanne Boerema: Marlan Palau Other Clinician: Referring Davieon Stockham: Treating Levita Monical/Extender: Karen Kays in  Treatment: 252 Clinic Level of Care Assessment Items TOOL 4 Quantity Score X- 1 0 Use when only an EandM is performed on FOLLOW-UP visit ASSESSMENTS - Nursing Assessment / Reassessment X- 1 10 Reassessment of Co-morbidities (includes updates in patient status) X- 1 5 Reassessment of Adherence to Treatment Plan ASSESSMENTS - Wound and Skin A ssessment / Reassessment []  - 0 Simple Wound Assessment / Reassessment - one wound X- 2 5 Complex Wound Assessment / Reassessment - multiple wounds X- 1 10 Dermatologic / Skin Assessment (not related to wound area) ASSESSMENTS - Focused Assessment X- 2 5 Circumferential Edema Measurements - multi extremities []  - 0 Nutritional Assessment / Counseling / Intervention Carlos White, Carlos White ([id number]) [id number] Page 2 of 9 []  - 0 Lower Extremity Assessment (monofilament, tuning fork, pulses) []  - 0 Peripheral Arterial Disease Assessment (using hand held doppler) ASSESSMENTS - Ostomy and/or Continence Assessment and Care []  - 0 Incontinence Assessment and Management []  - 0 Ostomy Care Assessment and Management (repouching, etc.) PROCESS - Coordination of Care []  - 0 Simple Patient / Family Education for ongoing care X- 1 20 Complex (extensive) Patient / Family Education for ongoing care X- 1 10 Staff obtains Chiropractor, Records, T Results / Process Orders est []  - 0 Staff telephones HHA, Nursing Homes / Clarify orders / etc []  - 0 Routine Transfer to another Facility (non-emergent condition) []  - 0 Routine Hospital Admission (non-emergent condition) []  - 0 New Admissions / Manufacturing engineer / Ordering NPWT Apligraf, etc. , []  - 0 Emergency Hospital Admission (emergent condition) []  - 0 Simple Discharge Coordination X- 1 15 Complex (extensive) Discharge Coordination PROCESS - Special Needs []  - 0 Pediatric / Minor Patient Management []  - 0 Isolation Patient Management []  - 0 Hearing / Language /  Visual special needs []  - 0 Assessment of Community assistance (transportation, D/C planning, etc.) []  - 0 Additional assistance / Altered mentation []  - 0 Support Surface(s)  Assessment (bed, cushion, seat, etc.) INTERVENTIONS - Wound Cleansing / Measurement []  - 0 Simple Wound Cleansing - one wound X- 2 5 Complex Wound Cleansing - multiple wounds X- 1 5 Wound Imaging (photographs - any number of wounds) []  - 0 Wound Tracing (instead of photographs) []  - 0 Simple Wound Measurement - one wound X- 2 5 Complex Wound Measurement - multiple wounds INTERVENTIONS - Wound Dressings []  - 0 Small Wound Dressing one or multiple wounds X- 2 15 Medium Wound Dressing one or multiple wounds []  - 0 Large Wound Dressing one or multiple wounds []  - 0 Application of Medications - topical []  - 0 Application of Medications - injection INTERVENTIONS - Miscellaneous []  - 0 External ear exam []  - 0 Specimen Collection (cultures, biopsies, blood, body fluids, etc.) []  - 0 Specimen(s) / Culture(s) sent or taken to Lab for analysis []  - 0 Patient Transfer (multiple staff / Nurse, adult / Similar devices) []  - 0 Simple Staple / Suture removal (25 or less) []  - 0 Complex Staple / Suture removal (26 or more) []  - 0 Hypo / Hyperglycemic Management (close monitor of Blood Glucose) Carlos White, Carlos White ([id number]) [id number] Page 3 of 9 []  - 0 Ankle / Brachial Index (ABI) - do not check if billed separately X- 1 5 Vital Signs Has the patient been seen at the hospital within the last three years: Yes Total Score: 150 Level Of Care: New/Established - Level 4 Electronic Signature(s) Signed: 07/30/2022 5:02:36 PM By: Shawn Stall RN, BSN Entered By: Shawn Stall on 07/30/2022 14:20:59 -------------------------------------------------------------------------------- Encounter Discharge Information Details Patient Name: Date of Service: Carlos White, Carlos White. 07/30/2022 2:15  PM Medical Record Number: [id number] Patient Account Number: [id number] Date of Birth/Sex: Treating RN: 06/03/1940 (82 y.o. Carlos White Primary Care Kimoni Pickerill: Marlan Palau Other Clinician: Referring [place] Masaki: Treating Marysol Wellnitz/Extender: Karen Kays in Treatment: [id number] Encounter Discharge Information Items Discharge Condition: Stable Ambulatory Status: Ambulatory Discharge Destination: Home Transportation: Private Auto Accompanied By: self Schedule Follow-up Appointment: Yes Clinical Summary of Care: Electronic Signature(s) Signed: 07/30/2022 5:02:36 PM By: Shawn Stall RN, BSN Entered By: Shawn Stall on 07/30/2022 14:21:48 -------------------------------------------------------------------------------- Lower Extremity Assessment Details Patient Name: Date of Service: Carlos White. 07/30/2022 2:15 PM Medical Record Number: [id number] Patient Account Number: [id number] Date of Birth/Sex: Treating RN: 1940-07-04 (81 y.o. Carlos White Primary Care Atalie Oros: Marlan Palau Other Clinician: Referring Kolton Kienle: Treating Makaylie Dedeaux/Extender: Karen Kays in Treatment: 252 Edema Assessment Assessed: [Left: Yes] Franne Forts: Yes] Edema: [Left: Yes] [Right: Yes] Calf Left: Right: Point of Measurement: 35.5 cm From Medial Instep 37 cm 37 cm Ankle Left: Right: Point of Measurement: 11 cm From Medial Instep 25 cm 23 cm Vascular Assessment IZEL, EISENHARDT White ([id number]) [Right:[id number] Page 4 of 9] Pulses: Dorsalis Pedis Palpable: [Left:Yes] [Right:Yes] Extremity colors, hair growth, and conditions: Extremity Color: [Left:Hyperpigmented] [Right:Hyperpigmented] Hair Growth on Extremity: [Left:No] [Right:No] Temperature of Extremity: [Left:Warm] [Right:Warm] Capillary Refill: [Left:< 3 seconds] [Right:< 3 seconds] Dependent Rubor: [Left:No] [Right:No] Blanched when Elevated: [Left:No No] [Right:No No] Toe Nail  Assessment Left: Right: Thick: Yes Yes Discolored: Yes Yes Deformed: Yes Yes Improper Length and Hygiene: No No Electronic Signature(s) Signed: 07/30/2022 5:02:36 PM By: Shawn Stall RN, BSN Entered By: Shawn Stall on 07/30/2022 14:08:49 -------------------------------------------------------------------------------- Multi-Disciplinary Care Plan Details Patient Name: Date of Service: Carlos White, Carlos White. 07/30/2022 2:15 PM Medical Record Number: [id number] Patient Account Number: [id number] Date of Birth/Sex: Treating RN: 12/24/1940 (  82 y.o. Carlos White Primary Care Judianne Seiple: Marlan Palau Other Clinician: Referring Kamilo Och: Treating Javanni Maring/Extender: Karen Kays in Treatment: 252 Multidisciplinary Care Plan reviewed with physician Active Inactive Venous Leg Ulcer Nursing Diagnoses: Knowledge deficit related to disease process and management Potential for venous Insuffiency (use before diagnosis confirmed) Goals: Patient will maintain optimal edema control Date Initiated: 09/30/2017 Target Resolution Date: 09/12/2022 Goal Status: Active Patient/caregiver will verbalize understanding of disease process and disease management Date Initiated: 09/30/2017 Date Inactivated: 11/04/2017 Target Resolution Date: 10/30/2017 Goal Status: Met Interventions: Assess peripheral edema status every visit. Provide education on venous insufficiency Notes: Electronic Signature(s) Signed: 07/30/2022 5:02:36 PM By: Shawn Stall RN, BSN Entered By: Shawn Stall on 07/30/2022 14:11:59 Carlos White ([id number]) [id number] Page 5 of 9 -------------------------------------------------------------------------------- Pain Assessment Details Patient Name: Date of Service: Carlos White 07/30/2022 2:15 PM Medical Record Number: [id number] Patient Account Number: [id number] Date of Birth/Sex: Treating RN: 03/13/1940 (82 y.o. Carlos White Primary Care Gillie Fleites: Marlan Palau Other Clinician: Referring Gared Gillie: Treating [place] Lesure/Extender: Karen Kays in Treatment: 252 Active Problems Location of Pain Severity and Description of Pain Patient Has Paino Yes Site Locations Pain Location: Pain in Ulcers Rate the pain. Current Pain Level: 4 Pain Management and Medication Current Pain Management: Medication: No Cold Application: No Rest: No Massage: No Activity: No T.E.N.S.: No Heat Application: No Leg drop or elevation: No Is the Current Pain Management Adequate: Adequate How does your wound impact your activities of daily livingo Sleep: No Bathing: No Appetite: No Relationship With Others: No Bladder Continence: No Emotions: No Bowel Continence: No Work: No Toileting: No Drive: No Dressing: No Hobbies: No Psychologist, prison and probation services) Signed: 07/30/2022 5:02:36 PM By: Shawn Stall RN, BSN Entered By: Shawn Stall on 07/30/2022 14:05:15 -------------------------------------------------------------------------------- Patient/Caregiver Education Details Patient Name: Date of Service: Carlos White 7/17/2024andnbsp2:15 PM Medical Record Number: [id number] Patient Account Number: [id number] Date of Birth/Gender: Treating RN: 08-Feb-1940 (81 y.o. Carlos White Primary Care Physician: Marlan Palau Other Clinician: Referring Physician: Treating Physician/Extender: Karen Kays in Treatment: [address], [place] White ([id number]) [id number] Page 6 of 9 Education Assessment Education Provided To: Patient Education Topics Provided Wound/Skin Impairment: Handouts: Caring for Your Ulcer Methods: Explain/Verbal Responses: Reinforcements needed Electronic Signature(s) Signed: 07/30/2022 5:02:36 PM By: Shawn Stall RN, BSN Entered By: Shawn Stall on 07/30/2022  14:12:20 -------------------------------------------------------------------------------- Wound Assessment Details Patient Name: Date of Service: Carlos White 07/30/2022 2:15 PM Medical Record Number: [id number] Patient Account Number: [id number] Date of Birth/Sex: Treating RN: April 22, 1940 (82 y.o. Carlos White Primary Care Lajada Janes: Marlan Palau Other Clinician: Referring Trudie Cervantes: Treating Smith Potenza/Extender: Karen Kays in Treatment: 252 Wound Status Wound Number: 2 Primary Venous Leg Ulcer Etiology: Wound Location: Left, Distal, Lateral Lower Leg Wound Status: Open Wounding Event: Gradually Appeared Comorbid Cataracts, Chronic Obstructive Pulmonary Disease (COPD), Date Acquired: 03/13/2017 History: Hypertension Weeks Of Treatment: 252 Clustered Wound: Yes Photos Wound Measurements Length: (cm) Width: (cm) Depth: (cm) Clustered Quantity: Area: (cm) Volume: (cm) 6 % Reduction in Area: 52.6% 7.8 % Reduction in Volume: 71.5% 0.3 Epithelialization: Medium (34-66%) 3 Tunneling: No 36.757 Undermining: No 11.027 Wound Description Classification: Full Thickness Without Exposed Sup Wound Margin: Distinct, outline attached Exudate Amount: Medium Exudate Type: Serosanguineous Exudate Color: red, brown port Structures Foul Odor After Cleansing: No Slough/Fibrino Yes Wound Bed Carlos White, Carlos White ([id number]) [id number] Page 7 of 9 Granulation Amount: Medium (  34-66%) Exposed Structure Granulation Quality: Red Fascia Exposed: No Necrotic Amount: Medium (34-66%) Fat Layer (Subcutaneous Tissue) Exposed: Yes Necrotic Quality: Adherent Slough Tendon Exposed: No Muscle Exposed: No Joint Exposed: No Bone Exposed: No Periwound Skin Texture Texture Color No Abnormalities Noted: No No Abnormalities Noted: No Erythema: No Moisture Hemosiderin Staining: Yes No Abnormalities Noted: No Dry / Scaly: Yes Treatment Notes Wound  #2 (Lower Leg) Wound Laterality: Left, Lateral, Distal Cleanser Soap and Water Discharge Instruction: May shower and wash wound with dial antibacterial soap and water prior to dressing change. Peri-Wound Care Zinc Oxide Ointment 30g tube Discharge Instruction: Apply Zinc Oxide to periwound with each dressing change as needed for maceration Sween Lotion (Moisturizing lotion) Discharge Instruction: Apply moisturizing lotion as directed Topical bacitracin Discharge Instruction: apply three times a week. Primary Dressing Secondary Dressing ABD Pad, 5x9 Discharge Instruction: Apply over primary dressing as directed. Secured With [employer], 4.5x3.1 (in/yd) Discharge Instruction: Secure with Kerlix as directed. Compression Wrap tubigrip D one layer Discharge Instruction: apply over the dressing from base of toes to just below the knee. Compression Stockings Add-Ons Electronic Signature(s) Signed: 07/30/2022 5:02:36 PM By: Shawn Stall RN, BSN Entered By: Shawn Stall on 07/30/2022 14:12:22 -------------------------------------------------------------------------------- Wound Assessment Details Patient Name: Date of Service: Carlos White 07/30/2022 2:15 PM Medical Record Number: [id number] Patient Account Number: [id number] Date of Birth/Sex: Treating RN: 03/14/1940 (81 y.o. Carlos White Primary Care Keanu Lesniak: Marlan Palau Other Clinician: Referring Merleen Picazo: Treating Benjaman Artman/Extender: Karen Kays in Treatment: 252 Wound Status Wound Number: 5 Primary Vasculopathy Etiology: DAYVON, DAX ([id number]) [id number] Page 8 of 9 Etiology: Wound Location: Right, Lateral, Anterior Lower Leg Wound Status: Open Wounding Event: Gradually Appeared Comorbid Cataracts, Chronic Obstructive Pulmonary Disease (COPD), Date Acquired: 10/26/2017 History: Hypertension Weeks Of Treatment: 248 Clustered Wound:  Yes Photos Wound Measurements Length: (cm) Width: (cm) Depth: (cm) Clustered Quantity: Area: (cm) Volume: (cm) 2 % Reduction in Area: 95.7% 1.5 % Reduction in Volume: 95.7% 0.1 Epithelialization: Large (67-100%) 1 Tunneling: No 2.356 Undermining: No 0.236 Wound Description Classification: Full Thickness Without Exposed Sup Wound Margin: Distinct, outline attached Exudate Amount: Medium Exudate Type: Serosanguineous Exudate Color: red, brown port Structures Foul Odor After Cleansing: No Slough/Fibrino Yes Wound Bed Granulation Amount: Large (67-100%) Exposed Structure Granulation Quality: Red Fascia Exposed: No Necrotic Amount: Small (1-33%) Fat Layer (Subcutaneous Tissue) Exposed: Yes Necrotic Quality: Adherent Slough Tendon Exposed: No Muscle Exposed: No Joint Exposed: No Bone Exposed: No Periwound Skin Texture Texture Color No Abnormalities Noted: No No Abnormalities Noted: No Callus: No Atrophie Blanche: No Crepitus: No Cyanosis: No Excoriation: No Ecchymosis: No Induration: No Erythema: No Rash: No Hemosiderin Staining: Yes Scarring: No Mottled: No Pallor: No Moisture Rubor: No No Abnormalities Noted: No Dry / Scaly: No Maceration: No Treatment Notes Wound #5 (Lower Leg) Wound Laterality: Right, Lateral, Anterior Cleanser Soap and Water Discharge Instruction: May shower and wash wound with dial antibacterial soap and water prior to dressing change. Peri-Wound Care Zinc Oxide Ointment 30g tube Discharge Instruction: Apply Zinc Oxide to periwound with each dressing change as needed for maceration Sween Lotion (Moisturizing lotion) Discharge Instruction: Apply moisturizing lotion as directed Topical Carlos White, Carlos White ([id number]) [id number] Page 9 of 9 bacitracin Discharge Instruction: apply three times a week. Primary Dressing Secondary Dressing ABD Pad, 5x9 Discharge Instruction: Apply over primary dressing as  directed. Secured With [employer], 4.5x3.1 (in/yd) Discharge Instruction: Secure with Kerlix as directed. Compression Wrap tubigrip D one layer  Discharge Instruction: apply over the dressing from base of toes to just below the knee. Compression Stockings Add-Ons Electronic Signature(s) Signed: 07/30/2022 5:02:36 PM By: Shawn Stall RN, BSN Entered By: Shawn Stall on 07/30/2022 14:12:43 -------------------------------------------------------------------------------- Vitals Details Patient Name: Date of Service: Carlos White, Carlos White 07/30/2022 2:15 PM Medical Record Number: [id number] Patient Account Number: [id number] Date of Birth/Sex: Treating RN: 08-22-1940 (82 y.o. Carlos White Primary Care Bunnie Rehberg: Marlan Palau Other Clinician: Referring Adynn Caseres: Treating Shaya Reddick/Extender: Karen Kays in Treatment: 252 Vital Signs Time Taken: 14:14 Temperature (F): 98.5 Height (in): 71 Pulse (bpm): 73 Weight (lbs): 220 Respiratory Rate (breaths/min): 20 Body Mass Index (BMI): 30.7 Blood Pressure (mmHg): 131/87 Reference Range: 80 - 120 mg / dl Electronic Signature(s) Signed: 07/30/2022 5:02:36 PM By: Shawn Stall RN, BSN Entered By: Shawn Stall on 07/30/2022 14:14:53

## 2022-08-01 MED ORDER — TRAMADOL HCL 50 MG PO TABS: 50.0000 mg | ORAL_TABLET | Freq: Two times a day (BID) | ORAL | 0 refills | Status: DC | PRN

## 2022-08-01 NOTE — Telephone Encounter (Signed)
done

## 2022-08-15 ENCOUNTER — Other Ambulatory Visit: Payer: Self-pay | Admitting: Internal Medicine

## 2022-09-05 ENCOUNTER — Other Ambulatory Visit: Payer: Self-pay | Admitting: Internal Medicine

## 2022-09-10 DIAGNOSIS — D485 Neoplasm of uncertain behavior of skin: Secondary | ICD-10-CM | POA: Diagnosis not present

## 2022-09-19 DIAGNOSIS — C4441 Basal cell carcinoma of skin of scalp and neck: Secondary | ICD-10-CM | POA: Diagnosis not present

## 2022-09-19 DIAGNOSIS — Z85828 Personal history of other malignant neoplasm of skin: Secondary | ICD-10-CM | POA: Diagnosis not present

## 2022-09-19 DIAGNOSIS — Z08 Encounter for follow-up examination after completed treatment for malignant neoplasm: Secondary | ICD-10-CM | POA: Diagnosis not present

## 2022-09-22 DIAGNOSIS — H35033 Hypertensive retinopathy, bilateral: Secondary | ICD-10-CM | POA: Diagnosis not present

## 2022-09-22 DIAGNOSIS — H43813 Vitreous degeneration, bilateral: Secondary | ICD-10-CM | POA: Diagnosis not present

## 2022-09-22 DIAGNOSIS — H353132 Nonexudative age-related macular degeneration, bilateral, intermediate dry stage: Secondary | ICD-10-CM | POA: Diagnosis not present

## 2022-10-01 ENCOUNTER — Encounter (HOSPITAL_BASED_OUTPATIENT_CLINIC_OR_DEPARTMENT_OTHER): Payer: Medicare PPO | Attending: Physician Assistant | Admitting: Physician Assistant

## 2022-10-01 DIAGNOSIS — L97322 Non-pressure chronic ulcer of left ankle with fat layer exposed: Secondary | ICD-10-CM | POA: Insufficient documentation

## 2022-10-01 DIAGNOSIS — Q818 Other epidermolysis bullosa: Secondary | ICD-10-CM | POA: Diagnosis not present

## 2022-10-01 DIAGNOSIS — I10 Essential (primary) hypertension: Secondary | ICD-10-CM | POA: Diagnosis not present

## 2022-10-01 DIAGNOSIS — I872 Venous insufficiency (chronic) (peripheral): Secondary | ICD-10-CM | POA: Diagnosis not present

## 2022-10-01 DIAGNOSIS — L97822 Non-pressure chronic ulcer of other part of left lower leg with fat layer exposed: Secondary | ICD-10-CM | POA: Insufficient documentation

## 2022-10-01 DIAGNOSIS — L97812 Non-pressure chronic ulcer of other part of right lower leg with fat layer exposed: Secondary | ICD-10-CM | POA: Insufficient documentation

## 2022-10-03 NOTE — Progress Notes (Signed)
[phone].pdf Page 7 of 10 Sween Lotion (Moisturizing lotion) Discharge Instruction: Apply moisturizing lotion as directed Topical Primary Dressing Xeroform Occlusive Gauze Dressing, 4x4 in Discharge Instruction: Apply to wound bed as instructed Secondary Dressing ABD Pad, 5x9 Discharge Instruction: Apply over primary dressing as directed. Secured With [employer], 4.5x3.1 (in/yd) Discharge Instruction: Secure with Kerlix as  directed. Compression Wrap tubigrip D one layer Discharge Instruction: apply over the dressing from base of toes to just below the knee. Compression Stockings Add-Ons Electronic Signature(s) Signed: 10/03/2022 12:48:07 PM By: Thayer Dallas Entered By: Thayer Dallas on 10/01/2022 11:36:11 -------------------------------------------------------------------------------- Wound Assessment Details Patient Name: Date of Service: Colen Darling RDO [name] 10/01/2022 2:15 PM Medical Record Number: [id number] Patient Account Number: [id number] Date of Birth/Sex: Treating RN: 07-09-1940 (82 y.o. M) Primary Care Johnwilliam Shepperson: Marlan Palau Other Clinician: Referring Anokhi Shannon: Treating Serinity Ware/Extender: Karen Kays in Treatment: 261 Wound Status Wound Number: 20 Primary Venous Leg Ulcer Etiology: Wound Location: Left, Medial Ankle Wound Status: Open Wounding Event: Gradually Appeared Comorbid Cataracts, Chronic Obstructive Pulmonary Disease (COPD), Date Acquired: 09/21/2022 History: Hypertension Weeks Of Treatment: 0 Clustered Wound: No Photos Wound Measurements Length: (cm) 2.5 Width: (cm) 0.8 Depth: (cm) 0.1 Area: (cm) 1.571 Volume: (cm) 0.157 Konicki, Erling H ([id number]) % Reduction in Area: % Reduction in Volume: Epithelialization: Small (1-33%) Tunneling: No Undermining: No [phone].pdf Page 8 of 10 Wound Description Classification: Full Thickness Without Exposed Suppor Wound Margin: Distinct, outline attached Exudate Amount: Medium Exudate Type: Serosanguineous Exudate Color: red, brown t Structures Foul Odor After Cleansing: No Slough/Fibrino No Wound Bed Granulation Amount: Large (67-100%) Exposed Structure Granulation Quality: Red Fascia Exposed: No Necrotic Amount: None Present (0%) Fat Layer (Subcutaneous Tissue) Exposed: No Tendon Exposed: No Muscle Exposed: No Joint Exposed: No Bone Exposed: No Periwound Skin  Texture Texture Color No Abnormalities Noted: No No Abnormalities Noted: No Callus: No Atrophie Blanche: No Crepitus: No Cyanosis: No Excoriation: No Ecchymosis: No Induration: No Erythema: No Rash: No Hemosiderin Staining: No Scarring: No Mottled: No Pallor: No Moisture Rubor: No No Abnormalities Noted: No Dry / Scaly: No Maceration: No Treatment Notes Wound #20 (Ankle) Wound Laterality: Left, Medial Cleanser Soap and Water Discharge Instruction: May shower and wash wound with dial antibacterial soap and water prior to dressing change. Peri-Wound Care Zinc Oxide Ointment 30g tube Discharge Instruction: Apply Zinc Oxide to periwound with each dressing change as needed for maceration Sween Lotion (Moisturizing lotion) Discharge Instruction: Apply moisturizing lotion as directed Topical bacitracin Discharge Instruction: apply three times a week. Primary Dressing Secondary Dressing ABD Pad, 5x9 Discharge Instruction: Apply over primary dressing as directed. Secured With [employer], 4.5x3.1 (in/yd) Discharge Instruction: Secure with Kerlix as directed. Compression Wrap tubigrip D one layer Discharge Instruction: apply over the dressing from base of toes to just below the knee. Compression Stockings Add-Ons Electronic Signature(s) Signed: 10/01/2022 5:41:52 PM By: Shawn Stall RN, BSN Entered By: Shawn Stall on 10/01/2022 11:46:47 Donata Duff ([id number]) [id number] Page 9 of 10 -------------------------------------------------------------------------------- Wound Assessment Details Patient Name: Date of Service: Tish Frederickson 10/01/2022 2:15 PM Medical Record Number: [id number] Patient Account Number: [id number] Date of Birth/Sex: Treating RN: 1940-11-16 (82 y.o. M) Primary Care Sianne Tejada: Marlan Palau Other Clinician: Referring Hayla Hinger: Treating Grettel Rames/Extender: Karen Kays in Treatment:  261 Wound Status Wound Number: 5 Primary Vasculopathy Etiology: Wound Location: Right, Lateral, Anterior Lower Leg Wound Status: Open Wounding Event: Gradually Appeared Comorbid Cataracts, Chronic Obstructive Pulmonary Disease (COPD),  Date Acquired: 10/26/2017 History: Hypertension Weeks Of Treatment: 257 Clustered Wound: Yes Photos Wound Measurements Length: (cm) Width: (cm) Depth: (cm) Clustered Quantity: Area: (cm) Volume: (cm) 8.5 % Reduction in Area: 44.7% 4.5 % Reduction in Volume: 44.7% 0.1 Epithelialization: Large (67-100%) 1 Tunneling: No 30.041 Undermining: No 3.004 Wound Description Classification: Full Thickness Without Exposed Sup Wound Margin: Distinct, outline attached Exudate Amount: Medium Exudate Type: Serosanguineous Exudate Color: red, brown port Structures Foul Odor After Cleansing: No Slough/Fibrino Yes Wound Bed Granulation Amount: Large (67-100%) Exposed Structure Granulation Quality: Red Fascia Exposed: No Necrotic Amount: Small (1-33%) Fat Layer (Subcutaneous Tissue) Exposed: Yes Necrotic Quality: Adherent Slough Tendon Exposed: No Muscle Exposed: No Joint Exposed: No Bone Exposed: No Periwound Skin Texture Texture Color No Abnormalities Noted: No No Abnormalities Noted: No Callus: No Atrophie Blanche: No Crepitus: No Cyanosis: No Excoriation: No Ecchymosis: No Induration: No Erythema: No Rash: No Hemosiderin Staining: Yes Scarring: No Mottled: No Pallor: No Moisture Rubor: No No Abnormalities Noted: No Dry / Scaly: No Maceration: No ACER, BALTZ ([id number]) [phone].pdf Page 10 of 10 Treatment Notes Wound #5 (Lower Leg) Wound Laterality: Right, Lateral, Anterior Cleanser Soap and Water Discharge Instruction: May shower and wash wound with dial antibacterial soap and water prior to dressing change. Peri-Wound Care Zinc Oxide Ointment 30g tube Discharge Instruction: Apply Zinc Oxide to  periwound with each dressing change as needed for maceration Sween Lotion (Moisturizing lotion) Discharge Instruction: Apply moisturizing lotion as directed Topical bacitracin Discharge Instruction: apply three times a week. Primary Dressing Secondary Dressing ABD Pad, 5x9 Discharge Instruction: Apply over primary dressing as directed. Secured With [employer], 4.5x3.1 (in/yd) Discharge Instruction: Secure with Kerlix as directed. Compression Wrap tubigrip D one layer Discharge Instruction: apply over the dressing from base of toes to just below the knee. Compression Stockings Add-Ons Electronic Signature(s) Signed: 10/03/2022 12:48:07 PM By: Thayer Dallas Entered By: Thayer Dallas on 10/01/2022 11:37:14 -------------------------------------------------------------------------------- Vitals Details Patient Name: Date of Service: [name], GO RDO [name] 10/01/2022 2:15 PM Medical Record Number: [id number] Patient Account Number: [id number] Date of Birth/Sex: Treating RN: 02-Oct-1940 (82 y.o. M) Primary Care Therma Lasure: Marlan Palau Other Clinician: Referring Caren Garske: Treating Matthieu Loftus/Extender: Karen Kays in Treatment: 261 Vital Signs Time Taken: 14:27 Temperature (F): 98.2 Height (in): 71 Pulse (bpm): 78 Weight (lbs): 220 Respiratory Rate (breaths/min): 20 Body Mass Index (BMI): 30.7 Blood Pressure (mmHg): 144/75 Reference Range: 80 - 120 mg / dl Electronic Signature(s) Signed: 10/03/2022 12:48:07 PM By: Thayer Dallas Entered By: Thayer Dallas on 10/01/2022 11:28:04  Date Acquired: 10/26/2017 History: Hypertension Weeks Of Treatment: 257 Clustered Wound: Yes Photos Wound Measurements Length: (cm) Width: (cm) Depth: (cm) Clustered Quantity: Area: (cm) Volume: (cm) 8.5 % Reduction in Area: 44.7% 4.5 % Reduction in Volume: 44.7% 0.1 Epithelialization: Large (67-100%) 1 Tunneling: No 30.041 Undermining: No 3.004 Wound Description Classification: Full Thickness Without Exposed Sup Wound Margin: Distinct, outline attached Exudate Amount: Medium Exudate Type: Serosanguineous Exudate Color: red, brown port Structures Foul Odor After Cleansing: No Slough/Fibrino Yes Wound Bed Granulation Amount: Large (67-100%) Exposed Structure Granulation Quality: Red Fascia Exposed: No Necrotic Amount: Small (1-33%) Fat Layer (Subcutaneous Tissue) Exposed: Yes Necrotic Quality: Adherent Slough Tendon Exposed: No Muscle Exposed: No Joint Exposed: No Bone Exposed: No Periwound Skin Texture Texture Color No Abnormalities Noted: No No Abnormalities Noted: No Callus: No Atrophie Blanche: No Crepitus: No Cyanosis: No Excoriation: No Ecchymosis: No Induration: No Erythema: No Rash: No Hemosiderin Staining: Yes Scarring: No Mottled: No Pallor: No Moisture Rubor: No No Abnormalities Noted: No Dry / Scaly: No Maceration: No ACER, BALTZ ([id number]) [phone].pdf Page 10 of 10 Treatment Notes Wound #5 (Lower Leg) Wound Laterality: Right, Lateral, Anterior Cleanser Soap and Water Discharge Instruction: May shower and wash wound with dial antibacterial soap and water prior to dressing change. Peri-Wound Care Zinc Oxide Ointment 30g tube Discharge Instruction: Apply Zinc Oxide to  periwound with each dressing change as needed for maceration Sween Lotion (Moisturizing lotion) Discharge Instruction: Apply moisturizing lotion as directed Topical bacitracin Discharge Instruction: apply three times a week. Primary Dressing Secondary Dressing ABD Pad, 5x9 Discharge Instruction: Apply over primary dressing as directed. Secured With [employer], 4.5x3.1 (in/yd) Discharge Instruction: Secure with Kerlix as directed. Compression Wrap tubigrip D one layer Discharge Instruction: apply over the dressing from base of toes to just below the knee. Compression Stockings Add-Ons Electronic Signature(s) Signed: 10/03/2022 12:48:07 PM By: Thayer Dallas Entered By: Thayer Dallas on 10/01/2022 11:37:14 -------------------------------------------------------------------------------- Vitals Details Patient Name: Date of Service: [name], GO RDO [name] 10/01/2022 2:15 PM Medical Record Number: [id number] Patient Account Number: [id number] Date of Birth/Sex: Treating RN: 02-Oct-1940 (82 y.o. M) Primary Care Therma Lasure: Marlan Palau Other Clinician: Referring Caren Garske: Treating Matthieu Loftus/Extender: Karen Kays in Treatment: 261 Vital Signs Time Taken: 14:27 Temperature (F): 98.2 Height (in): 71 Pulse (bpm): 78 Weight (lbs): 220 Respiratory Rate (breaths/min): 20 Body Mass Index (BMI): 30.7 Blood Pressure (mmHg): 144/75 Reference Range: 80 - 120 mg / dl Electronic Signature(s) Signed: 10/03/2022 12:48:07 PM By: Thayer Dallas Entered By: Thayer Dallas on 10/01/2022 11:28:04  [phone].pdf Page 7 of 10 Sween Lotion (Moisturizing lotion) Discharge Instruction: Apply moisturizing lotion as directed Topical Primary Dressing Xeroform Occlusive Gauze Dressing, 4x4 in Discharge Instruction: Apply to wound bed as instructed Secondary Dressing ABD Pad, 5x9 Discharge Instruction: Apply over primary dressing as directed. Secured With [employer], 4.5x3.1 (in/yd) Discharge Instruction: Secure with Kerlix as  directed. Compression Wrap tubigrip D one layer Discharge Instruction: apply over the dressing from base of toes to just below the knee. Compression Stockings Add-Ons Electronic Signature(s) Signed: 10/03/2022 12:48:07 PM By: Thayer Dallas Entered By: Thayer Dallas on 10/01/2022 11:36:11 -------------------------------------------------------------------------------- Wound Assessment Details Patient Name: Date of Service: Colen Darling RDO [name] 10/01/2022 2:15 PM Medical Record Number: [id number] Patient Account Number: [id number] Date of Birth/Sex: Treating RN: 07-09-1940 (82 y.o. M) Primary Care Johnwilliam Shepperson: Marlan Palau Other Clinician: Referring Anokhi Shannon: Treating Serinity Ware/Extender: Karen Kays in Treatment: 261 Wound Status Wound Number: 20 Primary Venous Leg Ulcer Etiology: Wound Location: Left, Medial Ankle Wound Status: Open Wounding Event: Gradually Appeared Comorbid Cataracts, Chronic Obstructive Pulmonary Disease (COPD), Date Acquired: 09/21/2022 History: Hypertension Weeks Of Treatment: 0 Clustered Wound: No Photos Wound Measurements Length: (cm) 2.5 Width: (cm) 0.8 Depth: (cm) 0.1 Area: (cm) 1.571 Volume: (cm) 0.157 Konicki, Erling H ([id number]) % Reduction in Area: % Reduction in Volume: Epithelialization: Small (1-33%) Tunneling: No Undermining: No [phone].pdf Page 8 of 10 Wound Description Classification: Full Thickness Without Exposed Suppor Wound Margin: Distinct, outline attached Exudate Amount: Medium Exudate Type: Serosanguineous Exudate Color: red, brown t Structures Foul Odor After Cleansing: No Slough/Fibrino No Wound Bed Granulation Amount: Large (67-100%) Exposed Structure Granulation Quality: Red Fascia Exposed: No Necrotic Amount: None Present (0%) Fat Layer (Subcutaneous Tissue) Exposed: No Tendon Exposed: No Muscle Exposed: No Joint Exposed: No Bone Exposed: No Periwound Skin  Texture Texture Color No Abnormalities Noted: No No Abnormalities Noted: No Callus: No Atrophie Blanche: No Crepitus: No Cyanosis: No Excoriation: No Ecchymosis: No Induration: No Erythema: No Rash: No Hemosiderin Staining: No Scarring: No Mottled: No Pallor: No Moisture Rubor: No No Abnormalities Noted: No Dry / Scaly: No Maceration: No Treatment Notes Wound #20 (Ankle) Wound Laterality: Left, Medial Cleanser Soap and Water Discharge Instruction: May shower and wash wound with dial antibacterial soap and water prior to dressing change. Peri-Wound Care Zinc Oxide Ointment 30g tube Discharge Instruction: Apply Zinc Oxide to periwound with each dressing change as needed for maceration Sween Lotion (Moisturizing lotion) Discharge Instruction: Apply moisturizing lotion as directed Topical bacitracin Discharge Instruction: apply three times a week. Primary Dressing Secondary Dressing ABD Pad, 5x9 Discharge Instruction: Apply over primary dressing as directed. Secured With [employer], 4.5x3.1 (in/yd) Discharge Instruction: Secure with Kerlix as directed. Compression Wrap tubigrip D one layer Discharge Instruction: apply over the dressing from base of toes to just below the knee. Compression Stockings Add-Ons Electronic Signature(s) Signed: 10/01/2022 5:41:52 PM By: Shawn Stall RN, BSN Entered By: Shawn Stall on 10/01/2022 11:46:47 Donata Duff ([id number]) [id number] Page 9 of 10 -------------------------------------------------------------------------------- Wound Assessment Details Patient Name: Date of Service: Tish Frederickson 10/01/2022 2:15 PM Medical Record Number: [id number] Patient Account Number: [id number] Date of Birth/Sex: Treating RN: 1940-11-16 (82 y.o. M) Primary Care Sianne Tejada: Marlan Palau Other Clinician: Referring Hayla Hinger: Treating Grettel Rames/Extender: Karen Kays in Treatment:  261 Wound Status Wound Number: 5 Primary Vasculopathy Etiology: Wound Location: Right, Lateral, Anterior Lower Leg Wound Status: Open Wounding Event: Gradually Appeared Comorbid Cataracts, Chronic Obstructive Pulmonary Disease (COPD),  WARING, WOOLARD ([id number]) [id number] Page 1 of 10 Visit Report for 10/01/2022 Arrival Information Details Patient Name: Date of Service: Tish Frederickson 10/01/2022 2:15 PM Medical Record Number: [id number] Patient Account Number: [id number] Date of Birth/Sex: Treating RN: June 29, 1940 (82 y.o. M) Primary Care Julanne Schlueter: Marlan Palau Other Clinician: Referring Asees Manfredi: Treating Amarius Toto/Extender: Karen Kays in Treatment: 261 Visit Information History Since Last Visit Added or deleted any medications: No Patient Arrived: Cane Any new allergies or adverse reactions: No Arrival Time: 14:27 Had a fall or experienced change in No Accompanied By: self activities of daily living that may affect Transfer Assistance: None risk of falls: Patient Identification Verified: Yes Signs or symptoms of abuse/neglect since last visito No Secondary Verification Process Completed: Yes Hospitalized since last visit: No Patient Requires Transmission-Based Precautions: No Implantable device outside of the clinic excluding No Patient Has Alerts: Yes cellular tissue based products placed in the center Patient Alerts: R ABI= 1.23, TBI = .86 since last visit: L ABI= 1.28, TBI=.81 Has Dressing in Place as Prescribed: Yes Pain Present Now: Yes Electronic Signature(s) Signed: 10/03/2022 12:48:07 PM By: Thayer Dallas Entered By: Thayer Dallas on 10/01/2022 11:27:43 -------------------------------------------------------------------------------- Clinic Level of Care Assessment Details Patient Name: Date of Service: Tish Frederickson 10/01/2022 2:15 PM Medical Record Number: [id number] Patient Account Number: [id number] Date of Birth/Sex: Treating RN: 28-Sep-1940 (82 y.o. Tammy Sours Primary Care Antwann Preziosi: Marlan Palau Other Clinician: Referring Sherlynn Tourville: Treating Kwamaine Cuppett/Extender: Karen Kays in Treatment: 261 Clinic Level of  Care Assessment Items TOOL 4 Quantity Score X- 1 0 Use when only an EandM is performed on FOLLOW-UP visit ASSESSMENTS - Nursing Assessment / Reassessment X- 1 10 Reassessment of Co-morbidities (includes updates in patient status) X- 1 5 Reassessment of Adherence to Treatment Plan ASSESSMENTS - Wound and Skin A ssessment / Reassessment []  - 0 Simple Wound Assessment / Reassessment - one wound X- 3 5 Complex Wound Assessment / Reassessment - multiple wounds X- 1 10 Dermatologic / Skin Assessment (not related to wound area) ASSESSMENTS - Focused Assessment X- 2 5 Circumferential Edema Measurements - multi extremities []  - 0 Nutritional Assessment / Counseling / Intervention ILAN, KEHRES ([id number]) [id number] Page 2 of 10 []  - 0 Lower Extremity Assessment (monofilament, tuning fork, pulses) []  - 0 Peripheral Arterial Disease Assessment (using hand held doppler) ASSESSMENTS - Ostomy and/or Continence Assessment and Care []  - 0 Incontinence Assessment and Management []  - 0 Ostomy Care Assessment and Management (repouching, etc.) PROCESS - Coordination of Care []  - 0 Simple Patient / Family Education for ongoing care X- 1 20 Complex (extensive) Patient / Family Education for ongoing care X- 1 10 Staff obtains Chiropractor, Records, T Results / Process Orders est []  - 0 Staff telephones HHA, Nursing Homes / Clarify orders / etc []  - 0 Routine Transfer to another Facility (non-emergent condition) []  - 0 Routine Hospital Admission (non-emergent condition) []  - 0 New Admissions / Manufacturing engineer / Ordering NPWT Apligraf, etc. , []  - 0 Emergency Hospital Admission (emergent condition) []  - 0 Simple Discharge Coordination X- 1 15 Complex (extensive) Discharge Coordination PROCESS - Special Needs []  - 0 Pediatric / Minor Patient Management []  - 0 Isolation Patient Management []  - 0 Hearing / Language / Visual special needs []  -  0 Assessment of Community assistance (transportation, D/C planning, etc.) []  - 0 Additional assistance / Altered mentation []  - 0 Support Surface(s) Assessment (bed, cushion, seat,

## 2022-10-06 DIAGNOSIS — I87312 Chronic venous hypertension (idiopathic) with ulcer of left lower extremity: Secondary | ICD-10-CM | POA: Diagnosis not present

## 2022-10-09 ENCOUNTER — Other Ambulatory Visit: Payer: Self-pay | Admitting: Family

## 2022-10-09 ENCOUNTER — Other Ambulatory Visit: Payer: Medicare PPO

## 2022-10-09 ENCOUNTER — Other Ambulatory Visit: Payer: Self-pay | Admitting: Internal Medicine

## 2022-10-09 DIAGNOSIS — Z13220 Encounter for screening for lipoid disorders: Secondary | ICD-10-CM

## 2022-10-09 DIAGNOSIS — Z0000 Encounter for general adult medical examination without abnormal findings: Secondary | ICD-10-CM

## 2022-10-09 DIAGNOSIS — I10 Essential (primary) hypertension: Secondary | ICD-10-CM

## 2022-10-09 DIAGNOSIS — Z125 Encounter for screening for malignant neoplasm of prostate: Secondary | ICD-10-CM

## 2022-10-10 NOTE — Telephone Encounter (Signed)
Last refill 09/05/22 #60 no refills.

## 2022-10-13 ENCOUNTER — Other Ambulatory Visit (INDEPENDENT_AMBULATORY_CARE_PROVIDER_SITE_OTHER): Payer: Medicare PPO

## 2022-10-13 DIAGNOSIS — D649 Anemia, unspecified: Secondary | ICD-10-CM | POA: Diagnosis not present

## 2022-10-13 DIAGNOSIS — I10 Essential (primary) hypertension: Secondary | ICD-10-CM | POA: Diagnosis not present

## 2022-10-13 DIAGNOSIS — Z13220 Encounter for screening for lipoid disorders: Secondary | ICD-10-CM | POA: Diagnosis not present

## 2022-10-13 DIAGNOSIS — Z125 Encounter for screening for malignant neoplasm of prostate: Secondary | ICD-10-CM

## 2022-10-13 DIAGNOSIS — Z87438 Personal history of other diseases of male genital organs: Secondary | ICD-10-CM | POA: Diagnosis not present

## 2022-10-13 DIAGNOSIS — Z0000 Encounter for general adult medical examination without abnormal findings: Secondary | ICD-10-CM

## 2022-10-13 LAB — POCT URINALYSIS DIP (CLINITEK)
Bilirubin, UA: NEGATIVE
Blood, UA: NEGATIVE
Glucose, UA: NEGATIVE mg/dL
Ketones, POC UA: NEGATIVE mg/dL
Leukocytes, UA: NEGATIVE
Nitrite, UA: NEGATIVE
POC PROTEIN,UA: NEGATIVE
Spec Grav, UA: 1.015 (ref 1.010–1.025)
Urobilinogen, UA: 0.2 U/dL
pH, UA: 6 (ref 5.0–8.0)

## 2022-10-13 NOTE — Addendum Note (Signed)
Addended by: Gregery Na on: 10/13/2022 02:15 PM   Modules accepted: Orders

## 2022-10-14 ENCOUNTER — Encounter: Payer: Self-pay | Admitting: Internal Medicine

## 2022-10-14 ENCOUNTER — Ambulatory Visit: Payer: Medicare PPO | Admitting: Internal Medicine

## 2022-10-14 VITALS — BP 130/70 | HR 78 | Ht 70.0 in | Wt 197.0 lb

## 2022-10-14 DIAGNOSIS — R931 Abnormal findings on diagnostic imaging of heart and coronary circulation: Secondary | ICD-10-CM | POA: Diagnosis not present

## 2022-10-14 DIAGNOSIS — D649 Anemia, unspecified: Secondary | ICD-10-CM | POA: Diagnosis not present

## 2022-10-14 DIAGNOSIS — I77810 Thoracic aortic ectasia: Secondary | ICD-10-CM

## 2022-10-14 DIAGNOSIS — Z0000 Encounter for general adult medical examination without abnormal findings: Secondary | ICD-10-CM | POA: Diagnosis not present

## 2022-10-14 DIAGNOSIS — G8921 Chronic pain due to trauma: Secondary | ICD-10-CM

## 2022-10-14 DIAGNOSIS — Z8249 Family history of ischemic heart disease and other diseases of the circulatory system: Secondary | ICD-10-CM

## 2022-10-14 DIAGNOSIS — Q819 Epidermolysis bullosa, unspecified: Secondary | ICD-10-CM | POA: Diagnosis not present

## 2022-10-14 DIAGNOSIS — I10 Essential (primary) hypertension: Secondary | ICD-10-CM

## 2022-10-14 DIAGNOSIS — Z0283 Encounter for blood-alcohol and blood-drug test: Secondary | ICD-10-CM | POA: Diagnosis not present

## 2022-10-14 DIAGNOSIS — Z1211 Encounter for screening for malignant neoplasm of colon: Secondary | ICD-10-CM | POA: Diagnosis not present

## 2022-10-14 DIAGNOSIS — Z87438 Personal history of other diseases of male genital organs: Secondary | ICD-10-CM

## 2022-10-14 LAB — CBC WITH DIFFERENTIAL/PLATELET
Absolute Monocytes: 313 {cells}/uL (ref 200–950)
Basophils Absolute: 32 {cells}/uL (ref 0–200)
Basophils Relative: 0.7 %
Eosinophils Absolute: 18 {cells}/uL (ref 15–500)
Eosinophils Relative: 0.4 %
HCT: 36.4 % — ABNORMAL LOW (ref 38.5–50.0)
Hemoglobin: 12.1 g/dL — ABNORMAL LOW (ref 13.2–17.1)
Lymphs Abs: 598 {cells}/uL — ABNORMAL LOW (ref 850–3900)
MCH: 32.6 pg (ref 27.0–33.0)
MCHC: 33.2 g/dL (ref 32.0–36.0)
MCV: 98.1 fL (ref 80.0–100.0)
MPV: 9.1 fL (ref 7.5–12.5)
Monocytes Relative: 6.8 %
Neutro Abs: 3639 {cells}/uL (ref 1500–7800)
Neutrophils Relative %: 79.1 %
Platelets: 212 10*3/uL (ref 140–400)
RBC: 3.71 10*6/uL — ABNORMAL LOW (ref 4.20–5.80)
RDW: 11.9 % (ref 11.0–15.0)
Total Lymphocyte: 13 %
WBC: 4.6 10*3/uL (ref 3.8–10.8)

## 2022-10-14 LAB — COMPLETE METABOLIC PANEL WITH GFR
AG Ratio: 1.4 (calc) (ref 1.0–2.5)
ALT: 8 U/L — ABNORMAL LOW (ref 9–46)
AST: 12 U/L (ref 10–35)
Albumin: 4 g/dL (ref 3.6–5.1)
Alkaline phosphatase (APISO): 75 U/L (ref 35–144)
BUN: 13 mg/dL (ref 7–25)
CO2: 26 mmol/L (ref 20–32)
Calcium: 9.2 mg/dL (ref 8.6–10.3)
Chloride: 105 mmol/L (ref 98–110)
Creat: 0.87 mg/dL (ref 0.70–1.22)
Globulin: 2.8 g/dL (ref 1.9–3.7)
Glucose, Bld: 100 mg/dL — ABNORMAL HIGH (ref 65–99)
Potassium: 4.3 mmol/L (ref 3.5–5.3)
Sodium: 139 mmol/L (ref 135–146)
Total Bilirubin: 0.5 mg/dL (ref 0.2–1.2)
Total Protein: 6.8 g/dL (ref 6.1–8.1)
eGFR: 86 mL/min/{1.73_m2} (ref >=60)

## 2022-10-14 LAB — IFOBT (OCCULT BLOOD): IFOBT: NEGATIVE

## 2022-10-14 LAB — LIPID PANEL
Cholesterol: 132 mg/dL (ref <200)
HDL: 59 mg/dL (ref >=40)
LDL Cholesterol (Calc): 60 mg/dL
Non-HDL Cholesterol (Calc): 73 mg/dL (ref <130)
Total CHOL/HDL Ratio: 2.2 (calc) (ref <5.0)
Triglycerides: 46 mg/dL (ref <150)

## 2022-10-14 LAB — PSA: PSA: 0.7 ng/mL (ref <=4.00)

## 2022-10-15 LAB — DRUG MONITORING, PANEL 8 WITH CONFIRMATION, URINE
6 Acetylmorphine: NEGATIVE ng/mL (ref <10)
Alcohol Metabolites: NEGATIVE ng/mL (ref <500)
Amphetamines: NEGATIVE ng/mL (ref <500)
Benzodiazepines: NEGATIVE ng/mL (ref <100)
Buprenorphine, Urine: NEGATIVE ng/mL (ref <5)
Cocaine Metabolite: NEGATIVE ng/mL (ref <150)
Creatinine: 40.7 mg/dL (ref >=20.0)
MDMA: NEGATIVE ng/mL (ref <500)
Marijuana Metabolite: NEGATIVE ng/mL (ref <20)
Opiates: NEGATIVE ng/mL (ref <100)
Oxidant: NEGATIVE ug/mL (ref <200)
Oxycodone: NEGATIVE ng/mL (ref <100)
pH: 5.6 (ref 4.5–9.0)

## 2022-10-15 LAB — DM TEMPLATE

## 2022-10-16 ENCOUNTER — Other Ambulatory Visit: Payer: Self-pay | Admitting: Internal Medicine

## 2022-10-16 NOTE — Telephone Encounter (Signed)
Last refill 10/10/22 #15

## 2022-10-17 NOTE — Telephone Encounter (Signed)
I called pharmacy to verify, the last refill request they got was on 10/10/2022 for 15 pills, they patient has enough to get thru till tomorrow. He needs a refill, he is asking if he can go back to getting 60 at a time instead of just 15.

## 2022-10-17 NOTE — Telephone Encounter (Signed)
error 

## 2022-10-20 NOTE — Patient Instructions (Signed)
Tramadol refilled.  Continue losartan 100 mg daily.  Continue Flomax for prostate hypertrophy.  Patient has chronic leg and back pain requiring tramadol treatment.  Urine drug screen obtained today is negative.  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  Medicare will not pay for Cologuard after the age of 67.  Stool is guaiac negative today.  Patient can be seen annually but if he is requiring pain management medications he will need to be seen more frequently.

## 2022-10-24 DIAGNOSIS — H35033 Hypertensive retinopathy, bilateral: Secondary | ICD-10-CM | POA: Diagnosis not present

## 2022-10-24 DIAGNOSIS — H43813 Vitreous degeneration, bilateral: Secondary | ICD-10-CM | POA: Diagnosis not present

## 2022-10-24 DIAGNOSIS — Z961 Presence of intraocular lens: Secondary | ICD-10-CM | POA: Diagnosis not present

## 2022-10-24 DIAGNOSIS — H353132 Nonexudative age-related macular degeneration, bilateral, intermediate dry stage: Secondary | ICD-10-CM | POA: Diagnosis not present

## 2022-11-11 DIAGNOSIS — I129 Hypertensive chronic kidney disease with stage 1 through stage 4 chronic kidney disease, or unspecified chronic kidney disease: Secondary | ICD-10-CM | POA: Diagnosis not present

## 2022-11-11 DIAGNOSIS — X32XXXD Exposure to sunlight, subsequent encounter: Secondary | ICD-10-CM | POA: Diagnosis not present

## 2022-11-11 DIAGNOSIS — K5900 Constipation, unspecified: Secondary | ICD-10-CM | POA: Diagnosis not present

## 2022-11-11 DIAGNOSIS — F1021 Alcohol dependence, in remission: Secondary | ICD-10-CM | POA: Diagnosis not present

## 2022-11-11 DIAGNOSIS — I87309 Chronic venous hypertension (idiopathic) without complications of unspecified lower extremity: Secondary | ICD-10-CM | POA: Diagnosis not present

## 2022-11-11 DIAGNOSIS — M545 Low back pain, unspecified: Secondary | ICD-10-CM | POA: Diagnosis not present

## 2022-11-11 DIAGNOSIS — C4441 Basal cell carcinoma of skin of scalp and neck: Secondary | ICD-10-CM | POA: Diagnosis not present

## 2022-11-11 DIAGNOSIS — N182 Chronic kidney disease, stage 2 (mild): Secondary | ICD-10-CM | POA: Diagnosis not present

## 2022-11-11 DIAGNOSIS — L570 Actinic keratosis: Secondary | ICD-10-CM | POA: Diagnosis not present

## 2022-11-11 DIAGNOSIS — N400 Enlarged prostate without lower urinary tract symptoms: Secondary | ICD-10-CM | POA: Diagnosis not present

## 2022-11-11 DIAGNOSIS — H3530 Unspecified macular degeneration: Secondary | ICD-10-CM | POA: Diagnosis not present

## 2022-11-11 DIAGNOSIS — M1990 Unspecified osteoarthritis, unspecified site: Secondary | ICD-10-CM | POA: Diagnosis not present

## 2022-11-12 ENCOUNTER — Encounter (HOSPITAL_BASED_OUTPATIENT_CLINIC_OR_DEPARTMENT_OTHER): Payer: Medicare PPO | Attending: Physician Assistant | Admitting: Physician Assistant

## 2022-11-12 DIAGNOSIS — I872 Venous insufficiency (chronic) (peripheral): Secondary | ICD-10-CM | POA: Diagnosis not present

## 2022-11-12 DIAGNOSIS — M318 Other specified necrotizing vasculopathies: Secondary | ICD-10-CM | POA: Diagnosis not present

## 2022-11-12 DIAGNOSIS — L97522 Non-pressure chronic ulcer of other part of left foot with fat layer exposed: Secondary | ICD-10-CM | POA: Diagnosis not present

## 2022-11-12 DIAGNOSIS — I10 Essential (primary) hypertension: Secondary | ICD-10-CM | POA: Insufficient documentation

## 2022-11-12 DIAGNOSIS — L97822 Non-pressure chronic ulcer of other part of left lower leg with fat layer exposed: Secondary | ICD-10-CM | POA: Diagnosis not present

## 2022-11-13 DIAGNOSIS — I87312 Chronic venous hypertension (idiopathic) with ulcer of left lower extremity: Secondary | ICD-10-CM | POA: Diagnosis not present

## 2022-11-13 NOTE — Progress Notes (Signed)
Carlos White ([id number]) [id number] Page 1 of 10 Visit Report for 11/12/2022 Arrival Information Details Patient Name: Date of Service: Carlos White 11/12/2022 2:15 PM Medical Record Number: [id number] Patient Account Number: [id number] Date of Birth/Sex: Treating RN: 1940/05/19 (82 y.o. Carlos White, Lauren Primary Care Kristi Norment: Marlan Palau Other Clinician: Referring Tzirel Leonor: Treating Ladamien Rammel/Extender: Karen Kays in Treatment: 267 Visit Information History Since Last Visit Added or deleted any medications: No Patient Arrived: Ambulatory Any new allergies or adverse reactions: No Arrival Time: 14:43 Had a fall or experienced change in No Accompanied By: Self activities of daily living that may affect Transfer Assistance: None risk of falls: Patient Identification Verified: Yes Signs or symptoms of abuse/neglect since last visito No Secondary Verification Process Completed: Yes Hospitalized since last visit: No Patient Requires Transmission-Based Precautions: No Implantable device outside of the clinic excluding No Patient Has Alerts: Yes cellular tissue based products placed in the center Patient Alerts: R ABI= 1.23, TBI = .86 since last visit: L ABI= 1.28, TBI=.81 Has Dressing in Place as Prescribed: Yes Pain Present Now: No Electronic Signature(s) Signed: 11/12/2022 4:22:52 PM By: Fonnie Mu RN Entered By: Fonnie Mu on 11/12/2022 14:45:25 -------------------------------------------------------------------------------- Clinic Level of Care Assessment Details Patient Name: Date of Service: Carlos White 11/12/2022 2:15 PM Medical Record Number: [id number] Patient Account Number: [id number] Date of Birth/Sex: Treating RN: 01/05/1941 (82 y.o. Carlos White Primary Care Benzion Mesta: Marlan Palau Other Clinician: Referring Shervon Kerwin: Treating Chauntel Windsor/Extender: Karen Kays in Treatment: 267 Clinic Level of Care Assessment Items TOOL 4 Quantity Score X- 1 0 Use when only an EandM is performed on FOLLOW-UP visit ASSESSMENTS - Nursing Assessment / Reassessment X- 1 10 Reassessment of Co-morbidities (includes updates in patient status) X- 1 5 Reassessment of Adherence to Treatment Plan ASSESSMENTS - Wound and Skin A ssessment / Reassessment X - Simple Wound Assessment / Reassessment - one wound 1 5 []  - 0 Complex Wound Assessment / Reassessment - multiple wounds []  - 0 Dermatologic / Skin Assessment (not related to wound area) ASSESSMENTS - Focused Assessment []  - 0 Circumferential Edema Measurements - multi extremities []  - 0 Nutritional Assessment / Counseling / Intervention ABDULRAHMAN, PINHEIRO ([id number]) [id number] Page 2 of 10 []  - 0 Lower Extremity Assessment (monofilament, tuning fork, pulses) []  - 0 Peripheral Arterial Disease Assessment (using hand held doppler) ASSESSMENTS - Ostomy and/or Continence Assessment and Care []  - 0 Incontinence Assessment and Management []  - 0 Ostomy Care Assessment and Management (repouching, etc.) PROCESS - Coordination of Care X - Simple Patient / Family Education for ongoing care 1 15 []  - 0 Complex (extensive) Patient / Family Education for ongoing care X- 1 10 Staff obtains Chiropractor, Records, T Results / Process Orders est X- 1 10 Staff telephones HHA, Nursing Homes / Clarify orders / etc []  - 0 Routine Transfer to another Facility (non-emergent condition) []  - 0 Routine Hospital Admission (non-emergent condition) []  - 0 New Admissions / Manufacturing engineer / Ordering NPWT Apligraf, etc. , []  - 0 Emergency Hospital Admission (emergent condition) X- 1 10 Simple Discharge Coordination []  - 0 Complex (extensive) Discharge Coordination PROCESS - Special Needs []  - 0 Pediatric / Minor Patient Management []  - 0 Isolation Patient Management []  - 0 Hearing  / Language / Visual special needs []  - 0 Assessment of Community assistance (transportation, D/C planning, etc.) []  - 0 Additional assistance / Altered mentation []  - 0 Support  Surface(s) Assessment (bed, cushion, seat, etc.) INTERVENTIONS - Wound Cleansing / Measurement []  - 0 Simple Wound Cleansing - one wound X- 2 5 Complex Wound Cleansing - multiple wounds []  - 0 Wound Imaging (photographs - any number of wounds) []  - 0 Wound Tracing (instead of photographs) []  - 0 Simple Wound Measurement - one wound X- 2 5 Complex Wound Measurement - multiple wounds INTERVENTIONS - Wound Dressings []  - 0 Small Wound Dressing one or multiple wounds X- 2 15 Medium Wound Dressing one or multiple wounds []  - 0 Large Wound Dressing one or multiple wounds X- 1 5 Application of Medications - topical []  - 0 Application of Medications - injection INTERVENTIONS - Miscellaneous []  - 0 External ear exam []  - 0 Specimen Collection (cultures, biopsies, blood, body fluids, etc.) []  - 0 Specimen(s) / Culture(s) sent or taken to Lab for analysis []  - 0 Patient Transfer (multiple staff / Nurse, adult / Similar devices) []  - 0 Simple Staple / Suture removal (25 or less) []  - 0 Complex Staple / Suture removal (26 or more) []  - 0 Hypo / Hyperglycemic Management (close monitor of Blood Glucose) DUWAYNE, BOHLAND H ([id number]) [id number] Page 3 of 10 []  - 0 Ankle / Brachial Index (ABI) - do not check if billed separately X- 1 5 Vital Signs Has the patient been seen at the hospital within the last three years: Yes Total Score: 125 Level Of Care: New/Established - Level 4 Electronic Signature(s) Signed: 11/12/2022 6:04:15 PM By: Karie Schwalbe RN Entered By: Karie Schwalbe on 11/12/2022 16:16:47 -------------------------------------------------------------------------------- Encounter Discharge Information Details Patient Name: Date of Service: Carlos White, GO RDO Carlos White  11/12/2022 2:15 PM Medical Record Number: [id number] Patient Account Number: [id number] Date of Birth/Sex: Treating RN: 01/04/1941 (82 y.o. Carlos White Primary Care Basilia Stuckert: Marlan Palau Other Clinician: Referring Rasheedah Reis: Treating Tacarra Justo/Extender: Karen Kays in Treatment: [phone] Encounter Discharge Information Items Discharge Condition: Stable Ambulatory Status: Cane Discharge Destination: Home Transportation: Private Auto Accompanied By: self Schedule Follow-up Appointment: Yes Clinical Summary of Care: Patient Declined Electronic Signature(s) Signed: 11/12/2022 6:04:15 PM By: Karie Schwalbe RN Entered By: Karie Schwalbe on 11/12/2022 16:17:54 -------------------------------------------------------------------------------- Lower Extremity Assessment Details Patient Name: Date of Service: Carlos White 11/12/2022 2:15 PM Medical Record Number: [id number] Patient Account Number: [id number] Date of Birth/Sex: Treating RN: 01-Nov-1940 (82 y.o. Carlos White Primary Care Letetia Romanello: Marlan Palau Other Clinician: Referring Nana Vastine: Treating Drevin Ortner/Extender: Karen Kays in Treatment: 267 Edema Assessment Assessed: [Left: No] [Right: No] Edema: [Left: Yes] [Right: Yes] Calf Left: Right: Point of Measurement: 35.5 cm From Medial Instep 37 cm 37 cm Ankle Left: Right: Point of Measurement: 11 cm From Medial Instep 26 cm 24 cm Vascular Assessment NIKLAS, FAIRFIELD H ([id number]) [[id number] Page 4 of 10] Pulses: Dorsalis Pedis Palpable: [Left:Yes] [Right:Yes] Extremity colors, hair growth, and conditions: Extremity Color: [Left:Hyperpigmented] [Right:Hyperpigmented] Hair Growth on Extremity: [Left:No] [Right:No] Temperature of Extremity: [Left:Warm] [Right:Warm] Capillary Refill: [Left:< 3 seconds] [Right:< 3 seconds] Dependent Rubor: [Left:No No] [Right:No No] Electronic  Signature(s) Signed: 11/12/2022 6:04:15 PM By: Karie Schwalbe RN Entered By: Karie Schwalbe on 11/12/2022 15:38:55 -------------------------------------------------------------------------------- Multi-Disciplinary Care Plan Details Patient Name: Date of Service: Carlos White, GO RDO Carlos White 11/12/2022 2:15 PM Medical Record Number: [id number] Patient Account Number: [id number] Date of Birth/Sex: Treating RN: 1940-04-21 (82 y.o. Carlos White Primary Care Aleeya Veitch: Marlan Palau Other Clinician: Referring Tiffancy Moger: Treating Shanon Becvar/Extender: Karen Kays in Treatment: [phone]  Multidisciplinary Care Plan reviewed with physician Active Inactive Venous Leg Ulcer Nursing Diagnoses: Knowledge deficit related to disease process and management Potential for venous Insuffiency (use before diagnosis confirmed) Goals: Patient will maintain optimal edema control Date Initiated: 09/30/2017 Target Resolution Date: 12/12/2022 Goal Status: Active Patient/caregiver will verbalize understanding of disease process and disease management Date Initiated: 09/30/2017 Date Inactivated: 11/04/2017 Target Resolution Date: 10/30/2017 Goal Status: Met Interventions: Assess peripheral edema status every visit. Provide education on venous insufficiency Notes: Electronic Signature(s) Signed: 11/12/2022 6:04:15 PM By: Karie Schwalbe RN Entered By: Karie Schwalbe on 11/12/2022 16:13:51 -------------------------------------------------------------------------------- Pain Assessment Details Patient Name: Date of Service: Carlos White 11/12/2022 2:15 PM Medical Record Number: [id number] Patient Account Number: [id number] Date of Birth/Sex: Treating RN: 01-Jan-1941 (82 y.o. Kallum, [place], Floyde Parkins ([id number]) [id number] Page 5 of 10 Primary Care Iker Nuttall: Marlan Palau Other Clinician: Referring Bonnee Zertuche: Treating Tanor Glaspy/Extender: Karen Kays in Treatment: 267 Active Problems Location of Pain Severity and Description of Pain Patient Has Paino No Site Locations Pain Management and Medication Current Pain Management: Electronic Signature(s) Signed: 11/12/2022 4:22:52 PM By: Fonnie Mu RN Entered By: Fonnie Mu on 11/12/2022 14:46:01 -------------------------------------------------------------------------------- Patient/Caregiver Education Details Patient Name: Date of Service: Carlos White 10/30/2024andnbsp2:15 PM Medical Record Number: [id number] Patient Account Number: [id number] Date of Birth/Gender: Treating RN: 09/24/1940 (82 y.o. Carlos White Primary Care Physician: Marlan Palau Other Clinician: Referring Physician: Treating Physician/Extender: Karen Kays in Treatment: [phone] Education Assessment Education Provided To: Patient Education Topics Provided Wound/Skin Impairment: Methods: Explain/Verbal Responses: State content correctly Electronic Signature(s) Signed: 11/12/2022 6:04:15 PM By: Karie Schwalbe RN Entered By: Karie Schwalbe on 11/12/2022 16:14:11 Donata Duff ([id number]) [id number] Page 6 of 10 -------------------------------------------------------------------------------- Wound Assessment Details Patient Name: Date of Service: Carlos White 11/12/2022 2:15 PM Medical Record Number: [id number] Patient Account Number: [id number] Date of Birth/Sex: Treating RN: Mar 31, 1940 (82 y.o. Carlos White, Lauren Primary Care Meeghan Skipper: Marlan Palau Other Clinician: Referring Lexington Devine: Treating Latandra Loureiro/Extender: Karen Kays in Treatment: 267 Wound Status Wound Number: 2 Primary Venous Leg Ulcer Etiology: Wound Location: Left, Distal, Lateral Lower Leg Wound Status: Open Wounding Event: Gradually Appeared Comorbid Cataracts, Chronic Obstructive Pulmonary Disease (COPD), Date  Acquired: 03/13/2017 History: Hypertension Weeks Of Treatment: 267 Clustered Wound: Yes Photos Wound Measurements Length: (cm) Width: (cm) Depth: (cm) Clustered Quantity: Area: (cm) Volume: (cm) 10 % Reduction in Area: -6.4% 10.5 % Reduction in Volume: 57.4% 0.2 Epithelialization: Medium (34-66%) 1 Tunneling: No 82.467 Undermining: No 16.493 Wound Description Classification: Full Thickness Without Exposed Sup Wound Margin: Distinct, outline attached Exudate Amount: Medium Exudate Type: Serosanguineous Exudate Color: red, brown port Structures Foul Odor After Cleansing: No Slough/Fibrino Yes Wound Bed Granulation Amount: Medium (34-66%) Exposed Structure Granulation Quality: Red, Hyper-granulation Fascia Exposed: No Necrotic Amount: Medium (34-66%) Fat Layer (Subcutaneous Tissue) Exposed: Yes Necrotic Quality: Adherent Slough Tendon Exposed: No Muscle Exposed: No Joint Exposed: No Bone Exposed: No Periwound Skin Texture Texture Color No Abnormalities Noted: No No Abnormalities Noted: No Erythema: No Moisture Hemosiderin Staining: Yes No Abnormalities Noted: No Dry / Scaly: Yes Treatment Notes Wound #2 (Lower Leg) Wound Laterality: Left, Lateral, Distal RONTEZ, PHILLIPPI ([id number]) [id number] Page 7 of 10 Soap and Water Discharge Instruction: May shower and wash wound with dial antibacterial soap and water prior to dressing change. Peri-Wound Care Zinc Oxide Ointment 30g tube Discharge Instruction: Apply Zinc Oxide to periwound with  each dressing change as needed for maceration Sween Lotion (Moisturizing lotion) Discharge Instruction: Apply moisturizing lotion as directed Topical Mupirocin Ointment Discharge Instruction: Apply Mupirocin (Bactroban) as instructed Primary Dressing Xeroform Occlusive Gauze Dressing, 4x4 in Discharge Instruction: Double Layer of Xeroform and Apply to wound bed as instructed Secondary  Dressing ABD Pad, 5x9 Discharge Instruction: Apply over primary dressing as directed. Secured With [employer], 4.5x3.1 (in/yd) Discharge Instruction: Secure with Kerlix as directed. Compression Wrap tubigrip D one layer Discharge Instruction: apply over the dressing from base of toes to just below the knee. Compression Stockings Add-Ons Electronic Signature(s) Signed: 11/12/2022 4:22:52 PM By: Fonnie Mu RN Signed: 11/12/2022 6:04:15 PM By: Karie Schwalbe RN Entered By: Karie Schwalbe on 11/12/2022 15:39:17 -------------------------------------------------------------------------------- Wound Assessment Details Patient Name: Date of Service: Colen Darling RDO Carlos White 11/12/2022 2:15 PM Medical Record Number: [id number] Patient Account Number: [id number] Date of Birth/Sex: Treating RN: July 06, 1940 (82 y.o. Carlos White, Lauren Primary Care Beronica Lansdale: Marlan Palau Other Clinician: Referring Therin Vetsch: Treating Judine Arciniega/Extender: Karen Kays in Treatment: 267 Wound Status Wound Number: 20 Primary Venous Leg Ulcer Etiology: Wound Location: Left, Medial Ankle Wound Status: Healed - Epithelialized Wounding Event: Gradually Appeared Comorbid Cataracts, Chronic Obstructive Pulmonary Disease (COPD), Date Acquired: 09/21/2022 History: Hypertension Weeks Of Treatment: 6 Clustered Wound: No Photos TERESA, CIANCIOLA ([id number]) [phone].pdf Page 8 of 10 Wound Measurements Length: (cm) Width: (cm) Depth: (cm) Area: (cm) Volume: (cm) 0 % Reduction in Area: 100% 0 % Reduction in Volume: 100% 0 Epithelialization: Large (67-100%) 0 Tunneling: No 0 Undermining: No Wound Description Classification: Full Thickness Without Exposed Support S Wound Margin: Distinct, outline attached Exudate Amount: None Present tructures Foul Odor After Cleansing: No Slough/Fibrino No Wound Bed Granulation Amount: None Present (0%) Exposed  Structure Necrotic Amount: None Present (0%) Fascia Exposed: No Fat Layer (Subcutaneous Tissue) Exposed: No Tendon Exposed: No Muscle Exposed: No Joint Exposed: No Bone Exposed: No Periwound Skin Texture Texture Color No Abnormalities Noted: No No Abnormalities Noted: No Callus: No Atrophie Blanche: No Crepitus: No Cyanosis: No Excoriation: No Ecchymosis: No Induration: No Erythema: No Rash: No Hemosiderin Staining: No Scarring: No Mottled: No Pallor: No Moisture Rubor: No No Abnormalities Noted: No Dry / Scaly: No Maceration: No Electronic Signature(s) Signed: 11/12/2022 4:22:52 PM By: Fonnie Mu RN Signed: 11/12/2022 6:04:15 PM By: Karie Schwalbe RN Entered By: Karie Schwalbe on 11/12/2022 15:44:47 -------------------------------------------------------------------------------- Wound Assessment Details Patient Name: Date of Service: Colen Darling RDO Carlos White 11/12/2022 2:15 PM Medical Record Number: [id number] Patient Account Number: [id number] Date of Birth/Sex: Treating RN: Jul 29, 1940 (82 y.o. Lucious Groves Primary Care Lanai Conlee: Marlan Palau Other Clinician: Referring Jahden Schara: Treating Sriyan Cutting/Extender: Karen Kays in Treatment: 267 Wound Status Wound Number: 5 Primary Vasculopathy Etiology: Wound Location: Right, Lateral, Anterior Lower Leg Wound Status: Open DRAKE, HUTCHINGS ([id number]) [id number] Page 9 of 10 Wound Status: Open Wounding Event: Gradually Appeared Comorbid Cataracts, Chronic Obstructive Pulmonary Disease (COPD), Date Acquired: 10/26/2017 History: Hypertension Weeks Of Treatment: 263 Clustered Wound: Yes Photos Wound Measurements Length: (cm) Width: (cm) Depth: (cm) Clustered Quantity: Area: (cm) Volume: (cm) 4 % Reduction in Area: 88.4% 2 % Reduction in Volume: 88.4% 0.1 Epithelialization: Large (67-100%) 1 Tunneling: No 6.283 Undermining: No 0.628 Wound  Description Classification: Full Thickness Without Exposed Sup Wound Margin: Distinct, outline attached Exudate Amount: Medium Exudate Type: Serosanguineous Exudate Color: red, brown port Structures Foul Odor After Cleansing: No Slough/Fibrino Yes Wound Bed Granulation Amount: Large (67-100%) Exposed Structure  Granulation Quality: Red, Hyper-granulation Fascia Exposed: No Necrotic Amount: Small (1-33%) Fat Layer (Subcutaneous Tissue) Exposed: Yes Necrotic Quality: Adherent Slough Tendon Exposed: No Muscle Exposed: No Joint Exposed: No Bone Exposed: No Periwound Skin Texture Texture Color No Abnormalities Noted: No No Abnormalities Noted: No Callus: No Atrophie Blanche: No Crepitus: No Cyanosis: No Excoriation: No Ecchymosis: No Induration: No Erythema: No Rash: No Hemosiderin Staining: Yes Scarring: No Mottled: No Pallor: No Moisture Rubor: No No Abnormalities Noted: No Dry / Scaly: No Maceration: No Treatment Notes Wound #5 (Lower Leg) Wound Laterality: Right, Lateral, Anterior Cleanser Soap and Water Discharge Instruction: May shower and wash wound with dial antibacterial soap and water prior to dressing change. Peri-Wound Care Zinc Oxide Ointment 30g tube Discharge Instruction: Apply Zinc Oxide to periwound with each dressing change as needed for maceration Sween Lotion (Moisturizing lotion) Discharge Instruction: Apply moisturizing lotion as directed Topical bacitracin SULEMAN, WOOLS ([id number]) [id number] Page 10 of 10 Discharge Instruction: apply three times a week. Primary Dressing Xeroform Occlusive Gauze Dressing, 4x4 in Discharge Instruction: double layer and Apply to wound bed as instructed Secondary Dressing ABD Pad, 5x9 Discharge Instruction: Apply over primary dressing as directed. Secured With [employer], 4.5x3.1 (in/yd) Discharge Instruction: Secure with Kerlix as directed. Compression Wrap tubigrip  D one layer Discharge Instruction: apply over the dressing from base of toes to just below the knee. Compression Stockings Add-Ons Electronic Signature(s) Signed: 11/12/2022 4:22:52 PM By: Fonnie Mu RN Signed: 11/12/2022 6:04:15 PM By: Karie Schwalbe RN Entered By: Karie Schwalbe on 11/12/2022 15:40:14 -------------------------------------------------------------------------------- Vitals Details Patient Name: Date of Service: Carlos White, GO RDO Carlos White 11/12/2022 2:15 PM Medical Record Number: [id number] Patient Account Number: [id number] Date of Birth/Sex: Treating RN: 01-Jan-1941 (82 y.o. Carlos White, Lauren Primary Care Madailein Londo: Marlan Palau Other Clinician: Referring Doc Mandala: Treating Alexxus Sobh/Extender: Karen Kays in Treatment: 267 Vital Signs Time Taken: 14:45 Temperature (F): 97.7 Height (in): 71 Pulse (bpm): 80 Weight (lbs): 220 Respiratory Rate (breaths/min): 20 Body Mass Index (BMI): 30.7 Blood Pressure (mmHg): 119/68 Reference Range: 80 - 120 mg / dl Electronic Signature(s) Signed: 11/12/2022 4:22:52 PM By: Fonnie Mu RN Entered By: Fonnie Mu on 11/12/2022 14:45:53

## 2022-11-16 ENCOUNTER — Other Ambulatory Visit: Payer: Self-pay | Admitting: Internal Medicine

## 2022-11-17 ENCOUNTER — Encounter: Payer: Self-pay | Admitting: Internal Medicine

## 2022-11-17 NOTE — Telephone Encounter (Signed)
Have refilled Tramadol for one month. Will need pain management appt in January with urine drug screen.

## 2022-12-16 ENCOUNTER — Other Ambulatory Visit: Payer: Self-pay | Admitting: Internal Medicine

## 2022-12-16 ENCOUNTER — Encounter (HOSPITAL_BASED_OUTPATIENT_CLINIC_OR_DEPARTMENT_OTHER): Payer: Medicare PPO | Attending: Internal Medicine | Admitting: Internal Medicine

## 2022-12-16 DIAGNOSIS — L97822 Non-pressure chronic ulcer of other part of left lower leg with fat layer exposed: Secondary | ICD-10-CM | POA: Diagnosis not present

## 2022-12-16 DIAGNOSIS — L97522 Non-pressure chronic ulcer of other part of left foot with fat layer exposed: Secondary | ICD-10-CM | POA: Insufficient documentation

## 2022-12-16 DIAGNOSIS — S91002A Unspecified open wound, left ankle, initial encounter: Secondary | ICD-10-CM | POA: Diagnosis not present

## 2022-12-16 DIAGNOSIS — I872 Venous insufficiency (chronic) (peripheral): Secondary | ICD-10-CM | POA: Diagnosis not present

## 2022-12-19 NOTE — Progress Notes (Signed)
Carlos White ([id number]) [id number] Page 1 of 14 Visit Report for 12/16/2022 Arrival Information Details Patient Name: Date of Service: Carlos White 12/16/2022 1:15 PM Medical Record Number: [id number] Patient Account Number: [id number] Date of Birth/Sex: Treating RN: [birth year] (82 y.o. Carlos White Primary Care Carlos White: Carlos White Other Clinician: Referring Carlos White: Treating Carlos White/Extender: Carlos White in Treatment: 271 Visit Information History Since Last Visit Added or deleted any medications: No Patient Arrived: Ambulatory Any new allergies or adverse reactions: No Arrival Time: 13:30 Had a fall or experienced change in No Accompanied By: self activities of daily living that may affect Transfer Assistance: None risk of falls: Patient Identification Verified: Yes Signs or symptoms of abuse/neglect since last visito No Secondary Verification Process Completed: Yes Hospitalized since last visit: No Patient Requires Transmission-Based Precautions: No Implantable device outside of the clinic excluding No Patient Has Alerts: Yes cellular tissue based products placed in the center Patient Alerts: R ABI= 1.23, TBI = .86 since last visit: L ABI= 1.28, TBI=.81 Has Dressing in Place as Prescribed: Yes Pain Present Now: No Electronic Signature(s) Signed: 12/17/2022 11:53:29 AM By: Carlos Deed RN, BSN Entered By: Carlos White on 12/16/2022 16:22:35 -------------------------------------------------------------------------------- Clinic Level of Care Assessment Details Patient Name: Date of Service: Carlos White 12/16/2022 1:15 PM Medical Record Number: [id number] Patient Account Number: [id number] Date of Birth/Sex: Treating RN: 11/03/1940 (81 y.o. Carlos White Primary Care Michal Callicott: Carlos White Other Clinician: Referring Carlos White: Treating Carlos White/Extender: Carlos White  in Treatment: 271 Clinic Level of Care Assessment Items TOOL 4 Quantity Score []  - 0 Use when only an EandM is performed on FOLLOW-UP visit ASSESSMENTS - Nursing Assessment / Reassessment X- 1 10 Reassessment of Co-morbidities (includes updates in patient status) X- 1 5 Reassessment of Adherence to Treatment Plan ASSESSMENTS - Wound and Skin A ssessment / Reassessment []  - 0 Simple Wound Assessment / Reassessment - one wound X- 4 5 Complex Wound Assessment / Reassessment - multiple wounds []  - 0 Dermatologic / Skin Assessment (not related to wound area) ASSESSMENTS - Focused Assessment X- 4 5 Circumferential Edema Measurements - multi extremities []  - 0 Nutritional Assessment / Counseling / Intervention Carlos White ([id number]) [phone].pdf Page 2 of 14 X- 1 5 Lower Extremity Assessment (monofilament, tuning fork, pulses) []  - 0 Peripheral Arterial Disease Assessment (using hand held doppler) ASSESSMENTS - Ostomy and/or Continence Assessment and Care []  - 0 Incontinence Assessment and Management []  - 0 Ostomy Care Assessment and Management (repouching, etc.) PROCESS - Coordination of Care X - Simple Patient / Family Education for ongoing care 1 15 []  - 0 Complex (extensive) Patient / Family Education for ongoing care X- 1 10 Staff obtains Chiropractor, Records, T Results / Process Orders est []  - 0 Staff telephones HHA, Nursing Homes / Clarify orders / etc []  - 0 Routine Transfer to another Facility (non-emergent condition) []  - 0 Routine Hospital Admission (non-emergent condition) []  - 0 New Admissions / Manufacturing engineer / Ordering NPWT Apligraf, etc. , []  - 0 Emergency Hospital Admission (emergent condition) X- 1 10 Simple Discharge Coordination []  - 0 Complex (extensive) Discharge Coordination PROCESS - Special Needs []  - 0 Pediatric / Minor Patient Management []  - 0 Isolation Patient Management []  - 0 Hearing / Language /  Visual special needs []  - 0 Assessment of Community assistance (transportation, D/C planning, etc.) []  - 0 Additional assistance / Altered mentation []  - 0 Support Surface(s) Assessment (  bed, cushion, seat, etc.) INTERVENTIONS - Wound Cleansing / Measurement []  - 0 Simple Wound Cleansing - one wound X- 4 5 Complex Wound Cleansing - multiple wounds []  - 0 Wound Imaging (photographs - any number of wounds) []  - 0 Wound Tracing (instead of photographs) []  - 0 Simple Wound Measurement - one wound X- 4 5 Complex Wound Measurement - multiple wounds INTERVENTIONS - Wound Dressings []  - 0 Small Wound Dressing one or multiple wounds X- 1 15 Medium Wound Dressing one or multiple wounds []  - 0 Large Wound Dressing one or multiple wounds []  - 0 Application of Medications - topical []  - 0 Application of Medications - injection INTERVENTIONS - Miscellaneous []  - 0 External ear exam []  - 0 Specimen Collection (cultures, biopsies, blood, body fluids, etc.) []  - 0 Specimen(s) / Culture(s) sent or taken to Lab for analysis []  - 0 Patient Transfer (multiple staff / Nurse, adult / Similar devices) []  - 0 Simple Staple / Suture removal (25 or less) []  - 0 Complex Staple / Suture removal (26 or more) []  - 0 Hypo / Hyperglycemic Management (close monitor of Blood Glucose) Carlos White ([id number]) [id number] Page 3 of 14 []  - 0 Ankle / Brachial Index (ABI) - do not check if billed separately X- 1 5 Vital Signs Has the patient been seen at the hospital within the last three years: Yes Total Score: 155 Level Of Care: New/Established - Level 4 Electronic Signature(s) Signed: 12/17/2022 11:53:29 AM By: Carlos Deed RN, BSN Entered By: Carlos White on 12/17/2022 07:54:01 -------------------------------------------------------------------------------- Encounter Discharge Information Details Patient Name: Date of Service: Carlos White, Carlos White. 12/16/2022  1:15 PM Medical Record Number: [id number] Patient Account Number: [id number] Date of Birth/Sex: Treating RN: 1940-12-26 (82 y.o. Carlos White Primary Care Carlos White: Carlos White Other Clinician: Referring Carlos White: Treating Carlos White/Extender: Carlos White in Treatment: 271 Encounter Discharge Information Items Discharge Condition: Stable Ambulatory Status: Cane Discharge Destination: Home Transportation: Private Auto Accompanied By: self Schedule Follow-up Appointment: Yes Clinical Summary of Care: Patient Declined Electronic Signature(s) Signed: 12/18/2022 5:01:11 PM By: Karie Schwalbe RN Entered By: Karie Schwalbe on 12/17/2022 11:51:32 -------------------------------------------------------------------------------- Lower Extremity Assessment Details Patient Name: Date of Service: Carlos White 12/16/2022 1:15 PM Medical Record Number: [id number] Patient Account Number: [id number] Date of Birth/Sex: Treating RN: May 09, 1940 (82 y.o. Carlos White Primary Care Ermalee Mealy: Carlos White Other Clinician: Referring Jazir Newey: Treating Lorain Fettes/Extender: Carlos White in Treatment: 271 Edema Assessment Assessed: Carlos White: No] Franne Forts: No] Edema: [Left: Yes] [Right: Yes] Calf Left: Right: Point of Measurement: 35.5 cm From Medial Instep 37.5 cm 35 cm Ankle Left: Right: Point of Measurement: 11 cm From Medial Instep 26.5 cm 23.5 cm Vascular Assessment KASPEN, MURK White ([id number]) [Right:[id number] Page 4 of 14] Pulses: Dorsalis Pedis Palpable: [Left:Yes] [Right:Yes] Extremity colors, hair growth, and conditions: Extremity Color: [Left:Hyperpigmented] [Right:Hyperpigmented] Hair Growth on Extremity: [Left:No] [Right:No] Temperature of Extremity: [Left:Warm] [Right:Warm] Capillary Refill: [Left:< 3 seconds] [Right:< 3 seconds] Dependent Rubor: [Left:No No] [Right:No No] Electronic Signature(s) Signed:  12/17/2022 11:53:29 AM By: Carlos Deed RN, BSN Entered By: Carlos White on 12/16/2022 16:23:43 -------------------------------------------------------------------------------- Multi Wound Chart Details Patient Name: Date of Service: Carlos White, Carlos White. 12/16/2022 1:15 PM Medical Record Number: [id number] Patient Account Number: [id number] Date of Birth/Sex: Treating RN: 1940/02/07 (82 y.o. M) Primary Care Kerra Guilfoil: Carlos White Other Clinician: Referring Lev Cervone: Treating Joua Bake/Extender: Carlos White in Treatment: 271 Vital Signs Height(in): 71 Pulse(bpm):  80 Weight(lbs): 220 Blood Pressure(mmHg): 157/74 Body Mass Index(BMI): 30.7 Temperature(F): 98 Respiratory Rate(breaths/min): 18 [2:Photos: No Photos Left, Distal, Lateral Lower Leg Wound Location: Gradually Appeared Wounding Event: Venous Leg Ulcer Primary Etiology: N/A Comorbid History: 03/13/2017 Date Acquired: 271 Weeks of Treatment: Open Wound Status: No Wound Recurrence: Yes Clustered  Wound: 4.9x2.5x0.1 Measurements L x W x D (cm) 9.621 A (cm) : rea 0.962 Volume (cm) : 87.60% % Reduction in Area: 97.50% % Reduction in Volume: Full Thickness Without Exposed Classification: Support Structures Medium Exudate A mount: Serosanguineous  Exudate Type: red, brown Exudate Color: N/A Wound Margin: N/A Granulation A mount: N/A Granulation Quality: N/A Necrotic A mount: N/A Epithelialization: No Abnormalities Noted Periwound Skin Texture: No Abnormalities Noted Periwound Skin Moisture: No  Abnormalities Noted Periwound Skin Color: N/A Temperature:] [21:No Photos Left, Medial Ankle Trauma Auto-immune Cataracts, Chronic Obstructive Pulmonary Disease (COPD), Hypertension 12/09/2022 0 Open No No 7.5x6.5x0.1 38.288 3.829 N/A N/A Full Thickness  Without Exposed Support Structures Medium Serosanguineous red, brown Flat and Intact Large (67-100%) Red None Present (0%) Large (67-100%) Scarring: Yes No Abnormalities Noted No  Abnormalities Noted No Abnormality] [22:No Photos Left, Posterior Lower Leg  Trauma Auto-immune Cataracts, Chronic Obstructive Pulmonary Disease (COPD), Hypertension 12/09/2022 0 Open No No 1.6x1.3x0.1 1.634 0.163 N/A N/A Full Thickness Without Exposed Support Structures Medium Serous amber Flat and Intact Large (67-100%) Red  None Present (0%) Small (1-33%) Scarring: Yes No Abnormalities Noted No Abnormalities Noted No Abnormality] Wound Number: 5 N/A N/A Photos: No Photos N/A N/A Right, Lateral, Anterior Lower Leg N/A N/A Wound Location: Gradually Appeared N/A N/A Wounding Event: Vasculopathy N/A N/A Primary EtiologyEILJAH, HEIDENREICH ([id number]) [phone].pdf Page 5 of 14 N/A N/A N/A Comorbid History: 10/26/2017 N/A N/A Date Acquired: 73 N/A N/A Weeks of Treatment: Open N/A N/A Wound Status: No N/A N/A Wound Recurrence: Yes N/A N/A Clustered Wound: 0.2x0.7x0.1 N/A N/A Measurements L x W x D (cm) 0.11 N/A N/A A (cm) : rea 0.011 N/A N/A Volume (cm) : 99.80% N/A N/A % Reduction in Area: 99.80% N/A N/A % Reduction in Volume: Full Thickness Without Exposed N/A N/A Classification: Support Structures Medium N/A N/A Exudate A mount: Serosanguineous N/A N/A Exudate Type: red, brown N/A N/A Exudate Color: N/A N/A N/A Wound Margin: N/A N/A N/A Granulation A mount: N/A N/A N/A Granulation Quality: N/A N/A N/A Necrotic A mount: N/A N/A N/A Exposed Structures: N/A N/A N/A Epithelialization: N/A N/A N/A Temperature: Treatment Notes Wound #2 (Lower Leg) Wound Laterality: Left, Lateral, Distal Cleanser Soap and Water Discharge Instruction: May shower and wash wound with dial antibacterial soap and water prior to dressing change. Peri-Wound Care Zinc Oxide Ointment 30g tube Discharge Instruction: Apply Zinc Oxide to periwound with each dressing change as needed for maceration Sween Lotion (Moisturizing lotion) Discharge Instruction: Apply  moisturizing lotion as directed Topical Mupirocin Ointment Discharge Instruction: Apply Mupirocin (Bactroban) as instructed Primary Dressing Xeroform Occlusive Gauze Dressing, 4x4 in Discharge Instruction: Double Layer of Xeroform and Apply to wound bed as instructed Secondary Dressing ABD Pad, 5x9 Discharge Instruction: Apply over primary dressing as directed. Secured With [employer], 4.5x3.1 (in/yd) Discharge Instruction: Secure with Kerlix as directed. Compression Wrap tubigrip D one layer Discharge Instruction: apply over the dressing from base of toes to just below the knee. Compression Stockings Add-Ons Wound #21 (Ankle) Wound Laterality: Left, Medial Cleanser Soap and Water Discharge Instruction: May shower and wash wound with dial antibacterial soap and water prior to dressing change. Peri-Wound Care Zinc Oxide Ointment  30g tube Discharge Instruction: Apply Zinc Oxide to periwound with each dressing change as needed for maceration Sween Lotion (Moisturizing lotion) Discharge Instruction: Apply moisturizing lotion as directed Topical Mupirocin Ointment MATHIUS, YAHOLA ([id number]) [phone].pdf Page 6 of 14 Discharge Instruction: Apply Mupirocin (Bactroban) as instructed Primary Dressing Xeroform Occlusive Gauze Dressing, 4x4 in Discharge Instruction: Double Layer of Xeroform and Apply to wound bed as instructed Secondary Dressing ABD Pad, 5x9 Discharge Instruction: Apply over primary dressing as directed. Secured With [employer], 4.5x3.1 (in/yd) Discharge Instruction: Secure with Kerlix as directed. Compression Wrap tubigrip D one layer Discharge Instruction: apply over the dressing from base of toes to just below the knee. Compression Stockings Add-Ons Wound #22 (Lower Leg) Wound Laterality: Left, Posterior Cleanser Soap and Water Discharge Instruction: May shower and wash wound with dial antibacterial soap and water  prior to dressing change. Peri-Wound Care Zinc Oxide Ointment 30g tube Discharge Instruction: Apply Zinc Oxide to periwound with each dressing change as needed for maceration Sween Lotion (Moisturizing lotion) Discharge Instruction: Apply moisturizing lotion as directed Topical Mupirocin Ointment Discharge Instruction: Apply Mupirocin (Bactroban) as instructed Primary Dressing Xeroform Occlusive Gauze Dressing, 4x4 in Discharge Instruction: Double Layer of Xeroform and Apply to wound bed as instructed Secondary Dressing ABD Pad, 5x9 Discharge Instruction: Apply over primary dressing as directed. Secured With [employer], 4.5x3.1 (in/yd) Discharge Instruction: Secure with Kerlix as directed. Compression Wrap tubigrip D one layer Discharge Instruction: apply over the dressing from base of toes to just below the knee. Compression Stockings Add-Ons Wound #5 (Lower Leg) Wound Laterality: Right, Lateral, Anterior Cleanser Soap and Water Discharge Instruction: May shower and wash wound with dial antibacterial soap and water prior to dressing change. Peri-Wound Care Zinc Oxide Ointment 30g tube Discharge Instruction: Apply Zinc Oxide to periwound with each dressing change as needed for maceration Sween Lotion (Moisturizing lotion) Discharge Instruction: Apply moisturizing lotion as directed Topical bacitracin Discharge Instruction: apply three times a week. Carlos, White ([id number]) [id number] Page 7 of 14 Primary Dressing Xeroform Occlusive Gauze Dressing, 4x4 in Discharge Instruction: double layer and Apply to wound bed as instructed Secondary Dressing ABD Pad, 5x9 Discharge Instruction: Apply over primary dressing as directed. Secured With [employer], 4.5x3.1 (in/yd) Discharge Instruction: Secure with Kerlix as directed. Compression Wrap tubigrip D one layer Discharge Instruction: apply over the dressing from base of toes to just  below the knee. Compression Stockings Add-Ons Electronic Signature(s) Signed: 12/21/2022 9:47:40 AM By: Baltazar Najjar MD Entered By: Baltazar Najjar on 12/20/2022 20:15:53 -------------------------------------------------------------------------------- Multi-Disciplinary Care Plan Details Patient Name: Date of Service: Carlos White, Carlos White 12/16/2022 1:15 PM Medical Record Number: [id number] Patient Account Number: [id number] Date of Birth/Sex: Treating RN: 06-Jun-1940 (82 y.o. Carlos White Primary Care Jimeka Balan: Carlos White Other Clinician: Referring Filippa Yarbough: Treating Cheri Ayotte/Extender: Carlos White in Treatment: 271 Multidisciplinary Care Plan reviewed with physician Active Inactive Venous Leg Ulcer Nursing Diagnoses: Knowledge deficit related to disease process and management Potential for venous Insuffiency (use before diagnosis confirmed) Goals: Patient will maintain optimal edema control Date Initiated: 09/30/2017 Target Resolution Date: 01/09/2023 Goal Status: Active Patient/caregiver will verbalize understanding of disease process and disease management Date Initiated: 09/30/2017 Date Inactivated: 11/04/2017 Target Resolution Date: 10/30/2017 Goal Status: Met Interventions: Assess peripheral edema status every visit. Provide education on venous insufficiency Notes: Electronic Signature(s) Signed: 12/17/2022 11:53:29 AM By: Carlos Deed RN, BSN Entered By: Carlos White on 12/17/2022 07:52:56 Carlos White ([id number]) [id number] Page 8 of 14 --------------------------------------------------------------------------------  Pain Assessment Details Patient Name: Date of Service: Carlos White 12/16/2022 1:15 PM Medical Record Number: [id number] Patient Account Number: [id number] Date of Birth/Sex: Treating RN: December 07, 1940 (82 y.o. Carlos White Primary Care Delfin Squillace: Carlos White Other  Clinician: Referring Rozetta Stumpp: Treating Norely Schlick/Extender: Carlos White in Treatment: 271 Active Problems Location of Pain Severity and Description of Pain Patient Has Paino No Site Locations Rate the pain. Current Pain Level: 0 Pain Management and Medication Current Pain Management: Electronic Signature(s) Signed: 12/17/2022 11:53:29 AM By: Carlos Deed RN, BSN Entered By: Carlos White on 12/16/2022 16:23:20 -------------------------------------------------------------------------------- Patient/Caregiver Education Details Patient Name: Date of Service: Carlos White 12/3/2024andnbsp1:15 PM Medical Record Number: [id number] Patient Account Number: [id number] Date of Birth/Gender: Treating RN: November 11, 1940 (82 y.o. Carlos White Primary Care Physician: Carlos White Other Clinician: Referring Physician: Treating Physician/Extender: Carlos White in Treatment: 271 Education Assessment Education Provided To: Patient Education Topics Provided Wound/Skin Impairment: Methods: Explain/Verbal Responses: Reinforcements needed, State content correctly Carlos White, Carlos White ([id number]) [id number] Page 9 of 14 Electronic Signature(s) Signed: 12/17/2022 11:53:29 AM By: Carlos Deed RN, BSN Entered By: Carlos White on 12/17/2022 07:53:12 -------------------------------------------------------------------------------- Wound Assessment Details Patient Name: Date of Service: Carlos White 12/16/2022 1:15 PM Medical Record Number: [id number] Patient Account Number: [id number] Date of Birth/Sex: Treating RN: Oct 10, 1940 (82 y.o. Carlos White Primary Care Dereonna Lensing: Carlos White Other Clinician: Referring Helaine Yackel: Treating Ronie Fleeger/Extender: Carlos White in Treatment: 271 Wound Status Wound Number: 2 Primary Etiology: Venous Leg Ulcer Wound Location: Left, Distal, Lateral  Lower Leg Wound Status: Open Wounding Event: Gradually Appeared Date Acquired: 03/13/2017 Weeks Of Treatment: 271 Clustered Wound: Yes Wound Measurements Length: (cm) 4.9 Width: (cm) 2.5 Depth: (cm) 0.1 Area: (cm) 9.621 Volume: (cm) 0.962 % Reduction in Area: 87.6% % Reduction in Volume: 97.5% Wound Description Classification: Full Thickness Without Exposed Suppor Exudate Amount: Medium Exudate Type: Serosanguineous Exudate Color: red, brown t Structures Periwound Skin Texture Texture Color No Abnormalities Noted: No No Abnormalities Noted: No Moisture No Abnormalities Noted: No Treatment Notes Wound #2 (Lower Leg) Wound Laterality: Left, Lateral, Distal Cleanser Soap and Water Discharge Instruction: May shower and wash wound with dial antibacterial soap and water prior to dressing change. Peri-Wound Care Zinc Oxide Ointment 30g tube Discharge Instruction: Apply Zinc Oxide to periwound with each dressing change as needed for maceration Sween Lotion (Moisturizing lotion) Discharge Instruction: Apply moisturizing lotion as directed Topical Mupirocin Ointment Discharge Instruction: Apply Mupirocin (Bactroban) as instructed Primary Dressing Xeroform Occlusive Gauze Dressing, 4x4 in Discharge Instruction: Double Layer of Xeroform and Apply to wound bed as instructed Secondary Dressing ARTEM, HABERKORN ([id number]) [phone].pdf Page 10 of 14 ABD Pad, 5x9 Discharge Instruction: Apply over primary dressing as directed. Secured With [employer], 4.5x3.1 (in/yd) Discharge Instruction: Secure with Kerlix as directed. Compression Wrap tubigrip D one layer Discharge Instruction: apply over the dressing from base of toes to just below the knee. Compression Stockings Add-Ons Electronic Signature(s) Signed: 12/17/2022 11:53:29 AM By: Carlos Deed RN, BSN Entered By: Carlos White on 12/16/2022  16:24:42 -------------------------------------------------------------------------------- Wound Assessment Details Patient Name: Date of Service: Carlos White 12/16/2022 1:15 PM Medical Record Number: [id number] Patient Account Number: [id number] Date of Birth/Sex: Treating RN: 10-11-1940 (82 y.o. Carlos White Primary Care Haeven Nickle: Carlos White Other Clinician: Referring Daurice Ovando: Treating Othelia Riederer/Extender: Carlos White in Treatment: 271 Wound Status Wound Number: 21 Primary Auto-immune Etiology:  Wound Location: Left, Medial Ankle Wound Status: Open Wounding Event: Trauma Comorbid Cataracts, Chronic Obstructive Pulmonary Disease (COPD), Date Acquired: 12/09/2022 History: Hypertension Weeks Of Treatment: 0 Clustered Wound: No Wound Measurements Length: (cm) 7.5 Width: (cm) 6.5 Depth: (cm) 0.1 Area: (cm) 38.288 Volume: (cm) 3.829 % Reduction in Area: % Reduction in Volume: Epithelialization: Large (67-100%) Tunneling: No Undermining: No Wound Description Classification: Full Thickness Without Exposed Support Structures Wound Margin: Flat and Intact Exudate Amount: Medium Exudate Type: Serosanguineous Exudate Color: red, brown Foul Odor After Cleansing: No Slough/Fibrino No Wound Bed Granulation Amount: Large (67-100%) Exposed Structure Granulation Quality: Red Fascia Exposed: No Necrotic Amount: None Present (0%) Fat Layer (Subcutaneous Tissue) Exposed: Yes Tendon Exposed: No Muscle Exposed: No Joint Exposed: No Bone Exposed: No Periwound Skin Texture Texture Color No Abnormalities Noted: No No Abnormalities Noted: Yes Scarring: Yes Temperature / Pain Temperature: No Abnormality Moisture No Abnormalities NotedJONI, Carlos White ([id number]) [phone].pdf Page 11 of 14 Treatment Notes Wound #21 (Ankle) Wound Laterality: Left, Medial Cleanser Soap and Water Discharge Instruction: May shower and  wash wound with dial antibacterial soap and water prior to dressing change. Peri-Wound Care Zinc Oxide Ointment 30g tube Discharge Instruction: Apply Zinc Oxide to periwound with each dressing change as needed for maceration Sween Lotion (Moisturizing lotion) Discharge Instruction: Apply moisturizing lotion as directed Topical Mupirocin Ointment Discharge Instruction: Apply Mupirocin (Bactroban) as instructed Primary Dressing Xeroform Occlusive Gauze Dressing, 4x4 in Discharge Instruction: Double Layer of Xeroform and Apply to wound bed as instructed Secondary Dressing ABD Pad, 5x9 Discharge Instruction: Apply over primary dressing as directed. Secured With [employer], 4.5x3.1 (in/yd) Discharge Instruction: Secure with Kerlix as directed. Compression Wrap tubigrip D one layer Discharge Instruction: apply over the dressing from base of toes to just below the knee. Compression Stockings Add-Ons Electronic Signature(s) Signed: 12/17/2022 11:53:29 AM By: Carlos Deed RN, BSN Entered By: Carlos White on 12/17/2022 07:49:51 -------------------------------------------------------------------------------- Wound Assessment Details Patient Name: Date of Service: Carlos White 12/16/2022 1:15 PM Medical Record Number: [id number] Patient Account Number: [id number] Date of Birth/Sex: Treating RN: May 06, 1940 (82 y.o. Carlos White Primary Care Tashya Alberty: Carlos White Other Clinician: Referring Anushree Dorsi: Treating Peggye Poon/Extender: Carlos White in Treatment: 271 Wound Status Wound Number: 22 Primary Auto-immune Etiology: Wound Location: Left, Posterior Lower Leg Wound Status: Open Wounding Event: Trauma Comorbid Cataracts, Chronic Obstructive Pulmonary Disease (COPD), Date Acquired: 12/09/2022 History: Hypertension Weeks Of Treatment: 0 Clustered Wound: No Wound Measurements Length: (cm) 1.6 Width: (cm) 1.3 Depth: (cm) 0.1 Area: (cm)  1.634 Volume: (cm) 0.163 Carlos White, Carlos White ([id number]) Wound Description Classification: Full Thickness Without Exposed Suppor Wound Margin: Flat and Intact Exudate Amount: Medium Exudate Type: Serous Exudate Color: amber t Structures Foul Odor After Cleansing: Slough/Fibrino % Reduction in Area: % Reduction in Volume: Epithelialization: Small (1-33%) Tunneling: No Undermining: No [id number] Page 12 of 14 No No Wound Bed Granulation Amount: Large (67-100%) Exposed Structure Granulation Quality: Red Fascia Exposed: No Necrotic Amount: None Present (0%) Fat Layer (Subcutaneous Tissue) Exposed: No Tendon Exposed: No Muscle Exposed: No Joint Exposed: No Bone Exposed: No Periwound Skin Texture Texture Color No Abnormalities Noted: No No Abnormalities Noted: Yes Scarring: Yes Temperature / Pain Temperature: No Abnormality Moisture No Abnormalities Noted: Yes Treatment Notes Wound #22 (Lower Leg) Wound Laterality: Left, Posterior Cleanser Soap and Water Discharge Instruction: May shower and wash wound with dial antibacterial soap and water prior to dressing change. Peri-Wound Care Zinc Oxide Ointment 30g tube Discharge Instruction:  Apply Zinc Oxide to periwound with each dressing change as needed for maceration Sween Lotion (Moisturizing lotion) Discharge Instruction: Apply moisturizing lotion as directed Topical Mupirocin Ointment Discharge Instruction: Apply Mupirocin (Bactroban) as instructed Primary Dressing Xeroform Occlusive Gauze Dressing, 4x4 in Discharge Instruction: Double Layer of Xeroform and Apply to wound bed as instructed Secondary Dressing ABD Pad, 5x9 Discharge Instruction: Apply over primary dressing as directed. Secured With [employer], 4.5x3.1 (in/yd) Discharge Instruction: Secure with Kerlix as directed. Compression Wrap tubigrip D one layer Discharge Instruction: apply over the dressing from base of toes to  just below the knee. Compression Stockings Add-Ons Electronic Signature(s) Signed: 12/17/2022 11:53:29 AM By: Carlos Deed RN, BSN Entered By: Carlos White on 12/17/2022 07:51:06 Carlos White ([id number]) [id number] Page 13 of 14 -------------------------------------------------------------------------------- Wound Assessment Details Patient Name: Date of Service: Carlos White 12/16/2022 1:15 PM Medical Record Number: [id number] Patient Account Number: [id number] Date of Birth/Sex: Treating RN: 1940/05/17 (82 y.o. Carlos White Primary Care Vaidehi Braddy: Carlos White Other Clinician: Referring Cherisa Brucker: Treating Morrissa Shein/Extender: Carlos White in Treatment: 271 Wound Status Wound Number: 5 Primary Etiology: Vasculopathy Wound Location: Right, Lateral, Anterior Lower Leg Wound Status: Open Wounding Event: Gradually Appeared Date Acquired: 10/26/2017 Weeks Of Treatment: 267 Clustered Wound: Yes Wound Measurements Length: (cm) 0.2 Width: (cm) 0.7 Depth: (cm) 0.1 Area: (cm) 0.11 Volume: (cm) 0.011 % Reduction in Area: 99.8% % Reduction in Volume: 99.8% Wound Description Classification: Full Thickness Without Exposed Suppor Exudate Amount: Medium Exudate Type: Serosanguineous Exudate Color: red, brown t Structures Periwound Skin Texture Texture Color No Abnormalities Noted: No No Abnormalities Noted: No Moisture No Abnormalities Noted: No Treatment Notes Wound #5 (Lower Leg) Wound Laterality: Right, Lateral, Anterior Cleanser Soap and Water Discharge Instruction: May shower and wash wound with dial antibacterial soap and water prior to dressing change. Peri-Wound Care Zinc Oxide Ointment 30g tube Discharge Instruction: Apply Zinc Oxide to periwound with each dressing change as needed for maceration Sween Lotion (Moisturizing lotion) Discharge Instruction: Apply moisturizing lotion as  directed Topical bacitracin Discharge Instruction: apply three times a week. Primary Dressing Xeroform Occlusive Gauze Dressing, 4x4 in Discharge Instruction: double layer and Apply to wound bed as instructed Secondary Dressing ABD Pad, 5x9 Discharge Instruction: Apply over primary dressing as directed. Secured With [employer], 4.5x3.1 (in/yd) Discharge Instruction: Secure with Kerlix as directed. Compression Wrap tubigrip D one layer Discharge Instruction: apply over the dressing from base of toes to just below the knee. Compression Stockings Carlos White, Carlos White ([id number]) [id number] Page 14 of 14 Add-Ons Electronic Signature(s) Signed: 12/17/2022 11:53:29 AM By: Carlos Deed RN, BSN Entered By: Carlos White on 12/16/2022 16:24:46 -------------------------------------------------------------------------------- Vitals Details Patient Name: Date of Service: Carlos White, Carlos White 12/16/2022 1:15 PM Medical Record Number: [id number] Patient Account Number: [id number] Date of Birth/Sex: Treating RN: [birth year] (82 y.o. Carlos White Primary Care Lasharon Dunivan: Carlos White Other Clinician: Referring Tamber Burtch: Treating Emaly Boschert/Extender: Carlos White in Treatment: 271 Vital Signs Time Taken: 15:30 Temperature (F): 98 Height (in): 71 Pulse (bpm): 80 Weight (lbs): 220 Respiratory Rate (breaths/min): 18 Body Mass Index (BMI): 30.7 Blood Pressure (mmHg): 157/74 Reference Range: 80 - 120 mg / dl Electronic Signature(s) Signed: 12/17/2022 11:53:29 AM By: Carlos Deed RN, BSN Entered By: Carlos White on 12/16/2022 16:23:10

## 2022-12-23 DIAGNOSIS — Z08 Encounter for follow-up examination after completed treatment for malignant neoplasm: Secondary | ICD-10-CM | POA: Diagnosis not present

## 2022-12-23 DIAGNOSIS — Z85828 Personal history of other malignant neoplasm of skin: Secondary | ICD-10-CM | POA: Diagnosis not present

## 2022-12-24 ENCOUNTER — Encounter (HOSPITAL_BASED_OUTPATIENT_CLINIC_OR_DEPARTMENT_OTHER): Payer: Medicare PPO | Admitting: Internal Medicine

## 2023-01-16 ENCOUNTER — Other Ambulatory Visit: Payer: Self-pay | Admitting: Internal Medicine

## 2023-01-19 ENCOUNTER — Encounter (HOSPITAL_BASED_OUTPATIENT_CLINIC_OR_DEPARTMENT_OTHER): Attending: Internal Medicine | Admitting: Internal Medicine

## 2023-01-19 NOTE — Progress Notes (Signed)
 Patient Care Team: Carlos Carlos PARAS, MD as PCP - General (Internal Medicine)  Visit Date: 01/20/23  Subjective:   Chief Complaint  Patient presents with   Pain Management   Patient PI:Carlos White.,Male DOB:1940/06/16,82 y.o.MRN:[id number]   82 y.o. Male presents today for follow-up for Musculoskeletal Pain (Low Back). Treated with 50 mg Tramadol  every 12 hours PRN. He reports taking twice a day, though he won't take them if he doesn't need them but will take Extra Strength Tylenol  instead.   Notes that he is going to see Wound Clinic tomorrow and Dermatology on Friday.   Hx epidermolysis bullosa, chronic midline low back pain which he has had for years. Has essential HTN  Hx closed fracture left acetabulum in 2022 requiring rehab stay and PT.  History of Alcoholism, in remission. He is going to [employer]  Mentions he has a Trigger Finger that he plans on having evaluated and treated by hand surgeon in the near future.  Past Medical History:  Diagnosis Date   Adenomatous polyp    Atypical chest pain    Broken hip (HCC)    Epidermolysis bullosa    History of alcoholism (HCC)    Hypertension    Splenomegaly 08/18/2013    Family History  Problem Relation Age of Onset   Hypertension Mother    Diabetes Mother    Heart disease Father    Social History   Social History Narrative   Recently lost his wife, Carlos White. Volunteering at [employer]. Going to AA, hasn't spoken at those meetings yet. Plans on staying in his home for right now taking care of his dog, but has thought about making arrangements to stay in [facility].    Review of Systems  Constitutional:  Negative for chills, fever, malaise/fatigue and weight loss.  HENT:  Negative for hearing loss, sinus pain and sore throat.   Respiratory:  Negative for cough, hemoptysis and shortness of breath.   Cardiovascular:  Negative for chest pain, palpitations, leg swelling and PND.  Gastrointestinal:  Negative for  abdominal pain, constipation, diarrhea, heartburn, nausea and vomiting.  Genitourinary:  Negative for dysuria, frequency and urgency.  Musculoskeletal:  Negative for back pain, myalgias and neck pain.  Skin:  Negative for itching and rash.  Neurological:  Negative for dizziness, tingling, seizures and headaches.  Endo/Heme/Allergies:  Negative for polydipsia.  Psychiatric/Behavioral:  Negative for depression. The patient is not nervous/anxious.      Objective:  Vitals: BP 130/70   Pulse 78   Ht 5' 10 (1.778 m)   SpO2 98%   BMI 28.27 kg/m  Physical Exam Constitutional:      General: He is not in acute distress.    Appearance: Normal appearance. He is not ill-appearing.  HENT:     Head: Normocephalic and atraumatic.  Cardiovascular:     Rate and Rhythm: Normal rate and regular rhythm.     Pulses: Normal pulses.     Heart sounds: Normal heart sounds. No murmur heard.    No friction rub. No gallop.  Pulmonary:     Effort: Pulmonary effort is normal. No respiratory distress.     Breath sounds: Normal breath sounds. No wheezing or rales.  Skin:    General: Skin is warm and dry.  Neurological:     Mental Status: He is alert and oriented to person, place, and time. Mental status is at baseline.  Psychiatric:        Mood and Affect: Mood normal.  Behavior: Behavior normal.        Thought Content: Thought content normal.        Judgment: Judgment normal.     Results:  Studies Obtained And Personally Reviewed By Me: Labs:     Component Value Date/Time   NA 139 10/13/2022 1025   NA 140 08/18/2013 1121   K 4.3 10/13/2022 1025   K 4.3 08/18/2013 1121   CL 105 10/13/2022 1025   CO2 26 10/13/2022 1025   CO2 25 08/18/2013 1121   GLUCOSE 100 (H) 10/13/2022 1025   GLUCOSE 92 08/18/2013 1121   BUN 13 10/13/2022 1025   BUN 10.0 08/18/2013 1121   CREATININE 0.87 10/13/2022 1025   CREATININE 0.8 08/18/2013 1121   CALCIUM 9.2 10/13/2022 1025   CALCIUM 9.4 08/18/2013 1121    PROT 6.8 10/13/2022 1025   PROT 6.4 08/18/2013 1121   ALBUMIN 3.4 (L) 05/09/2020 0307   ALBUMIN 3.7 08/18/2013 1121   AST 12 10/13/2022 1025   AST 15 08/18/2013 1121   ALT 8 (L) 10/13/2022 1025   ALT 11 08/18/2013 1121   ALKPHOS 56 05/09/2020 0307   ALKPHOS 55 08/18/2013 1121   BILITOT 0.5 10/13/2022 1025   BILITOT 0.65 08/18/2013 1121   GFRNONAA >60 05/10/2020 0308   GFRNONAA 76 05/08/2020 0922   GFRAA 88 05/08/2020 0922    Lab Results  Component Value Date   WBC 4.6 10/13/2022   HGB 12.1 (L) 10/13/2022   HCT 36.4 (L) 10/13/2022   MCV 98.1 10/13/2022   PLT 212 10/13/2022   Lab Results  Component Value Date   CHOL 132 10/13/2022   HDL 59 10/13/2022   LDLCALC 60 10/13/2022   TRIG 46 10/13/2022   CHOLHDL 2.2 10/13/2022   Lab Results  Component Value Date   HGBA1C 5.3 04/19/2013   Lab Results  Component Value Date   PSA 0.70 10/13/2022   PSA 0.75 10/03/2021   PSA 0.77 05/08/2020   Assessment & Plan:   Musculoskeletal Pain (Lower Back): PDMP Reviewed. Treated with 50 mg Tramadol  every 12 hours as needed. He reports taking twice a day, though he won't take them if he doesn't need them but will take Extra Strength Tylenol  instead. Drug Monitoring, Panel 8 With Confirmation, Urine specimen collected.  Alcoholism: in remission - he is going to [employer] Has been sober for many years.  Bereavement: currently living alone after wife passed away last year. Has a 2 level town home.  Trigger Finger: he plans on having evaluated and treated by Hand Surgeon  Vaccine Counseling: UTD of Flu.   Return in April (04/01 for Labs and 04/03 for Office Visit) for 6 Month Follow-up  I,Carlos White,acting as a scribe for Carlos JINNY Hailstone, MD.,have documented all relevant documentation on the behalf of Carlos JINNY Hailstone, MD,as directed by  Carlos JINNY Hailstone, MD while in the presence of Carlos JINNY Hailstone, MD.   I, Carlos JINNY Hailstone, MD, have reviewed all documentation for this visit. The documentation  on 01/20/23 for the exam, diagnosis, procedures, and orders are all accurate and complete.

## 2023-01-20 ENCOUNTER — Ambulatory Visit: Payer: Medicare PPO | Admitting: Internal Medicine

## 2023-01-20 ENCOUNTER — Encounter: Payer: Self-pay | Admitting: Internal Medicine

## 2023-01-20 VITALS — BP 130/70 | HR 78 | Ht 70.0 in

## 2023-01-20 DIAGNOSIS — Z0283 Encounter for blood-alcohol and blood-drug test: Secondary | ICD-10-CM

## 2023-01-20 DIAGNOSIS — Q819 Epidermolysis bullosa, unspecified: Secondary | ICD-10-CM | POA: Diagnosis not present

## 2023-01-20 DIAGNOSIS — G8921 Chronic pain due to trauma: Secondary | ICD-10-CM

## 2023-01-20 NOTE — Patient Instructions (Addendum)
 Patient is requiring chronic Tramadol prescription for chronic low back pain, He takes med reliably and responsibly. Has submitted to drug testing today.

## 2023-01-21 ENCOUNTER — Encounter (HOSPITAL_BASED_OUTPATIENT_CLINIC_OR_DEPARTMENT_OTHER): Payer: Medicare PPO | Attending: Physician Assistant | Admitting: Internal Medicine

## 2023-01-21 DIAGNOSIS — L97822 Non-pressure chronic ulcer of other part of left lower leg with fat layer exposed: Secondary | ICD-10-CM | POA: Insufficient documentation

## 2023-01-21 DIAGNOSIS — I872 Venous insufficiency (chronic) (peripheral): Secondary | ICD-10-CM

## 2023-01-21 DIAGNOSIS — B372 Candidiasis of skin and nail: Secondary | ICD-10-CM | POA: Insufficient documentation

## 2023-01-21 DIAGNOSIS — I87313 Chronic venous hypertension (idiopathic) with ulcer of bilateral lower extremity: Secondary | ICD-10-CM | POA: Diagnosis not present

## 2023-01-21 DIAGNOSIS — L97812 Non-pressure chronic ulcer of other part of right lower leg with fat layer exposed: Secondary | ICD-10-CM | POA: Insufficient documentation

## 2023-01-21 DIAGNOSIS — Q818 Other epidermolysis bullosa: Secondary | ICD-10-CM | POA: Diagnosis not present

## 2023-01-22 DIAGNOSIS — I87312 Chronic venous hypertension (idiopathic) with ulcer of left lower extremity: Secondary | ICD-10-CM | POA: Diagnosis not present

## 2023-01-22 LAB — DRUG MONITORING, PANEL 8 WITH CONFIRMATION, URINE
6 Acetylmorphine: NEGATIVE ng/mL (ref <10)
Alcohol Metabolites: NEGATIVE ng/mL (ref <500)
Amphetamines: NEGATIVE ng/mL (ref <500)
Benzodiazepines: NEGATIVE ng/mL (ref <100)
Buprenorphine, Urine: NEGATIVE ng/mL (ref <5)
Cocaine Metabolite: NEGATIVE ng/mL (ref <150)
Creatinine: 111.3 mg/dL (ref >=20.0)
MDMA: NEGATIVE ng/mL (ref <500)
Marijuana Metabolite: NEGATIVE ng/mL (ref <20)
Opiates: NEGATIVE ng/mL (ref <100)
Oxidant: NEGATIVE ug/mL (ref <200)
Oxycodone: NEGATIVE ng/mL (ref <100)
pH: 6 (ref 4.5–9.0)

## 2023-01-22 LAB — DM TEMPLATE

## 2023-01-22 NOTE — Progress Notes (Signed)
 Carlos White ([id number]) [id number] Page 1 of 13 Visit Report for 01/21/2023 Arrival Information Details Patient Name: Date of Service: Carlos White Carlos White Carlos White 01/21/2023 12:45 PM Medical Record Number: [id number] Patient Account Number: [id number] Date of Birth/Sex: Treating RN: 07/28/1940 (83 y.o. Carlos White Primary Care Carlos White: Carlos White Other Clinician: Referring Carlos White: Treating Carlos White/Extender: Carlos White Carlos White: Carlos White Visit Information History Since Last Visit Added or deleted any medications: No Patient Arrived: Ambulatory Any new allergies or adverse reactions: No Arrival Time: 13:10 Had a fall or experienced change in No Accompanied By: self activities of daily living that may affect Transfer Assistance: None risk of falls: Patient Identification Verified: Yes Signs or symptoms of abuse/neglect since last visito No Secondary Verification Process Completed: Yes Hospitalized since last visit: No Patient Requires Transmission-Based Precautions: No Implantable device outside of the clinic excluding No Patient Has Alerts: Yes cellular tissue based products placed in the center Patient Alerts: R ABI= 1.23, TBI = .86 since last visit: L ABI= 1.28, TBI=.81 Has Dressing in Place as Prescribed: Yes Pain Present Now: No Electronic Signature(s) Signed: 01/21/2023 3:11:11 PM By: Carlos Sailors RN, BSN Entered By: Carlos White on 01/21/2023 13:11:01 -------------------------------------------------------------------------------- Clinic Level of Care Assessment Details Patient Name: Date of Service: Carlos White Carlos White Carlos White 01/21/2023 12:45 PM Medical Record Number: [id number] Patient Account Number: [id number] Date of Birth/Sex: Treating RN: 04-Nov-1940 (83 y.o. Carlos White Primary Care Carlos White: Carlos White Other Clinician: Referring Carlos White: Treating Carlos White/Extender: Carlos White Carlos in  White: Carlos White Clinic Level of Care Assessment Items TOOL 4 Quantity Score X- 1 0 Use when only an EandM is performed on FOLLOW-UP visit ASSESSMENTS - Nursing Assessment / Reassessment X- 1 10 Reassessment of Co-morbidities (includes updates in patient status) X- 1 5 Reassessment of Adherence to White Plan ASSESSMENTS - Wound and Skin A ssessment / Reassessment []  - 0 Simple Wound Assessment / Reassessment - one wound X- 3 5 Complex Wound Assessment / Reassessment - multiple wounds X- 1 10 Dermatologic / Skin Assessment (not related to wound area) ASSESSMENTS - Focused Assessment X- 2 5 Circumferential Edema Measurements - multi extremities []  - 0 Nutritional Assessment / Counseling / Intervention Carlos White, Carlos White ([id number]) [phone].pdf Page 2 of 13 []  - 0 Lower Extremity Assessment (monofilament, tuning fork, pulses) []  - 0 Peripheral Arterial Disease Assessment (using hand held doppler) ASSESSMENTS - Ostomy and/or Continence Assessment and Care []  - 0 Incontinence Assessment and Management []  - 0 Ostomy Care Assessment and Management (repouching, etc.) PROCESS - Coordination of Care []  - 0 Simple Patient / Family Education for ongoing care X- 1 20 Complex (extensive) Patient / Family Education for ongoing care X- 1 10 Staff obtains Chiropractor, Records, T Results / Process Orders est []  - 0 Staff telephones HHA, Nursing Homes / Clarify orders / etc []  - 0 Routine Transfer to another Facility (non-emergent condition) []  - 0 Routine Hospital Admission (non-emergent condition) []  - 0 New Admissions / Manufacturing Engineer / Ordering NPWT Apligraf, etc. , []  - 0 Emergency Hospital Admission (emergent condition) []  - 0 Simple Discharge Coordination X- 1 15 Complex (extensive) Discharge Coordination PROCESS - Special Needs []  - 0 Pediatric / Minor Patient Management []  - 0 Isolation Patient Management []  - 0 Hearing / Language /  Visual special needs []  - 0 Assessment of Community assistance (transportation, D/C planning, etc.) []  - 0 Additional assistance / Altered mentation []  - 0 Support Surface(s) Assessment (bed,  cushion, seat, etc.) INTERVENTIONS - Wound Cleansing / Measurement []  - 0 Simple Wound Cleansing - one wound X- 3 5 Complex Wound Cleansing - multiple wounds X- 1 5 Wound Imaging (photographs - any number of wounds) []  - 0 Wound Tracing (instead of photographs) []  - 0 Simple Wound Measurement - one wound X- 3 5 Complex Wound Measurement - multiple wounds INTERVENTIONS - Wound Dressings []  - 0 Small Wound Dressing one or multiple wounds X- 2 15 Medium Wound Dressing one or multiple wounds []  - 0 Large Wound Dressing one or multiple wounds X- 1 5 Application of Medications - topical []  - 0 Application of Medications - injection INTERVENTIONS - Miscellaneous []  - 0 External ear exam []  - 0 Specimen Collection (cultures, biopsies, blood, body fluids, etc.) []  - 0 Specimen(s) / Culture(s) sent or taken to Lab for analysis []  - 0 Patient Transfer (multiple staff / Nurse, Adult / Similar devices) []  - 0 Simple Staple / Suture removal (25 or less) []  - 0 Complex Staple / Suture removal (26 or more) []  - 0 Hypo / Hyperglycemic Management (close monitor of Blood Glucose) Carlos White, Carlos White ([id number]) [id number] Page 3 of 13 []  - 0 Ankle / Brachial Index (ABI) - do not check if billed separately X- 1 5 Vital Signs Has the patient been seen at the hospital within the last three years: Yes Total Score: 170 Level Of Care: New/Established - Level 5 Electronic Signature(s) Signed: 01/21/2023 5:28:22 PM By: Carlos Nestle RN, BSN Entered By: Carlos White on 01/21/2023 15:51:00 -------------------------------------------------------------------------------- Encounter Discharge Information Details Patient Name: Date of Service: Carlos White, Carlos White. 01/21/2023 12:45  PM Medical Record Number: [id number] Patient Account Number: [id number] Date of Birth/Sex: Treating RN: 1940/04/21 (83 y.o. Carlos White Primary Care Carlos Martinovich: Carlos White Other Clinician: Referring Annabelle Rexroad: Treating Jimma Ortman/Extender: Carlos White Carlos White: Carlos White Encounter Discharge Information Items Discharge Condition: Stable Ambulatory Status: Ambulatory Discharge Destination: Home Transportation: Private Auto Accompanied By: self Schedule Follow-up Appointment: Yes Clinical Summary of Care: Patient Declined Electronic Signature(s) Signed: 01/21/2023 5:28:22 PM By: Carlos Nestle RN, BSN Entered By: Carlos White on 01/21/2023 15:53:07 -------------------------------------------------------------------------------- Lower Extremity Assessment Details Patient Name: Date of Service: Carlos White Carlos White Carlos White 01/21/2023 12:45 PM Medical Record Number: [id number] Patient Account Number: [id number] Date of Birth/Sex: Treating RN: 12/28/1940 (83 y.o. Carlos White Primary Care Quianna Avery: Carlos White Other Clinician: Referring Xian Alves: Treating Ayeza Therriault/Extender: Carlos White Carlos White: Carlos White Edema Assessment Assessed: [Left: No] [Right: No] Edema: [Left: Yes] [Right: Yes] Calf Left: Right: Point of Measurement: 35.5 cm From Medial Instep 38.5 cm 37.5 cm Ankle Left: Right: Point of Measurement: 11 cm From Medial Instep 26 cm 24 cm Vascular Assessment JAHLON, Carlos White White ([id number]) [Right:[id number] Page 4 of 13] Extremity colors, hair growth, and conditions: Extremity Color: [Left:Hyperpigmented] [Right:Hyperpigmented] Hair Growth on Extremity: [Left:No] [Right:No] Temperature of Extremity: [Left:Warm] [Right:Warm] Capillary Refill: [Left:< 3 seconds] [Right:< 3 seconds] Dependent Rubor: [Left:No No] [Right:No No] Electronic Signature(s) Signed: 01/21/2023 3:11:11 PM By: Carlos Sailors RN, BSN Entered By:  Carlos White on 01/21/2023 13:11:59 -------------------------------------------------------------------------------- Multi Wound Chart Details Patient Name: Date of Service: Carlos White, Carlos White. 01/21/2023 12:45 PM Medical Record Number: [id number] Patient Account Number: [id number] Date of Birth/Sex: Treating RN: 11/25/1940 (83 y.o. M) Primary Care Mackensey Bolte: Carlos White Other Clinician: Referring Deena Shaub: Treating Blue Ruggerio/Extender: Carlos White Carlos White: Carlos White Vital Signs Height(in): 71 Pulse(bpm): 69 Weight(lbs): 220 Blood Pressure(mmHg): 161/78  Body Carlos White Index(BMI): 30.7 Temperature(F): 98.0 Respiratory Rate(breaths/min): 18 [2:Photos:] [22:No Photos] Left, Distal, Lateral Lower Leg Left, Medial Ankle Left, Posterior Lower Leg Wound Location: Gradually Appeared Trauma Trauma Wounding Event: Venous Leg Ulcer Auto-immune Auto-immune Primary Etiology: Cataracts, Chronic Obstructive Cataracts, Chronic Obstructive Cataracts, Chronic Obstructive Comorbid History: Pulmonary Disease (COPD), Pulmonary Disease (COPD), Pulmonary Disease (COPD), Hypertension Hypertension Hypertension 03/13/2017 12/09/2022 12/09/2022 Date Acquired: Carlos White 5 5 Weeks of White: Open Open Healed - Epithelialized Wound Status: No No No Wound Recurrence: Yes No No Clustered Wound: 9x11x0.1 4x3.5x0.1 0x0x0 Measurements L x W x D (cm) 77.754 10.996 0 A (cm) : rea 7.775 1.1 0 Volume (cm) : -0.30% 71.30% 100.00% % Reduction in Area: 79.90% 71.30% 100.00% % Reduction in Volume: Full Thickness Without Exposed Full Thickness Without Exposed Full Thickness Without Exposed Classification: Support Structures Support Structures Support Structures Medium Medium None Present Exudate Amount: Serosanguineous Serosanguineous N/A Exudate Type: red, brown red, brown N/A Exudate Color: N/A Flat and Intact Flat and Intact Wound Margin: Large (67-100%) Large (67-100%) None Present  (0%) Granulation Amount: Red Red N/A Granulation Quality: Small (1-33%) None Present (0%) None Present (0%) Necrotic Amount: Fat Layer (Subcutaneous Tissue): Yes Fat Layer (Subcutaneous Tissue): Yes Fascia: No Exposed Structures: Fascia: No Fascia: No Fat Layer (Subcutaneous Tissue): No Tendon: No Tendon: No Tendon: No Muscle: No Muscle: No Muscle: No Joint: No Joint: No Joint: No Bone: No Bone: No Bone: No Carlos White, Carlos White ([id number]) [id number] Page 5 of 13 None Large (67-100%) Large (67-100%) Epithelialization: Scarring: Yes Scarring: Yes Periwound Skin Texture: No Abnormalities Noted No Abnormalities Noted Periwound Skin Moisture: No Abnormalities Noted No Abnormalities Noted Periwound Skin Color: N/A No Abnormality No Abnormality Temperature: Wound Number: 5 N/A N/A Photos: N/A N/A Right, Lateral, Anterior Lower Leg N/A N/A Wound Location: Gradually Appeared N/A N/A Wounding Event: Vasculopathy N/A N/A Primary Etiology: Cataracts, Chronic Obstructive N/A N/A Comorbid History: Pulmonary Disease (COPD), Hypertension 10/26/2017 N/A N/A Date Acquired: 273 N/A N/A Weeks of White: Open N/A N/A Wound Status: No N/A N/A Wound Recurrence: Yes N/A N/A Clustered Wound: 3.4x2.2x0.1 N/A N/A Measurements L x W x D (cm) 5.875 N/A N/A A (cm) : rea 0.587 N/A N/A Volume (cm) : 89.20% N/A N/A % Reduction in Area: 89.20% N/A N/A % Reduction in Volume: Full Thickness Without Exposed N/A N/A Classification: Support Structures Medium N/A N/A Exudate A mount: Serosanguineous N/A N/A Exudate Type: red, brown N/A N/A Exudate Color: N/A N/A N/A Wound Margin: Large (67-100%) N/A N/A Granulation A mount: Red N/A N/A Granulation Quality: Small (1-33%) N/A N/A Necrotic A mount: Fat Layer (Subcutaneous Tissue): Yes N/A N/A Exposed Structures: Large (67-100%) N/A N/A Epithelialization: N/A N/A N/A Temperature: White  Notes Electronic Signature(s) Signed: 01/21/2023 5:26:35 PM By: Carlos Raisin DO Entered By: Carlos Raisin on 01/21/2023 14:18:40 -------------------------------------------------------------------------------- Multi-Disciplinary Care Plan Details Patient Name: Date of Service: Carlos White, Carlos White. 01/21/2023 12:45 PM Medical Record Number: [id number] Patient Account Number: [id number] Date of Birth/Sex: Treating RN: 25-Mar-1940 (83 y.o. Carlos White Primary Care Tandy Grawe: Carlos White Other Clinician: Referring Keyetta Hollingworth: Treating Toneisha Savary/Extender: Carlos Raisin Carlos White Carlos White: Carlos White Multidisciplinary Care Plan reviewed with physician Active Inactive Venous Leg Ulcer Nursing Diagnoses: Knowledge deficit related to disease process and management Carlos White, Carlos White ([id number]) [phone].pdf Page 6 of 13 Potential for venous Insuffiency (use before diagnosis confirmed) Goals: Patient will maintain optimal edema control Date Initiated: 09/30/2017 Target Resolution Date: 03/12/2023 Goal Status: Active Patient/caregiver will verbalize understanding of disease process  and disease management Date Initiated: 09/30/2017 Date Inactivated: 11/04/2017 Target Resolution Date: 10/30/2017 Goal Status: Met Interventions: Assess peripheral edema status every visit. Provide education on venous insufficiency Notes: Electronic Signature(s) Signed: 01/21/2023 3:11:11 PM By: Carlos Sailors RN, BSN Entered By: Carlos White on 01/21/2023 13:13:47 -------------------------------------------------------------------------------- Pain Assessment Details Patient Name: Date of Service: Carlos White Carlos White Carlos White 01/21/2023 12:45 PM Medical Record Number: [id number] Patient Account Number: [id number] Date of Birth/Sex: Treating RN: 12/15/1940 (83 y.o. Carlos White Primary Care Norman Piacentini: Carlos White Other Clinician: Referring Annel Zunker: Treating Darby Fleeman/Extender:  Carlos White Carlos White: Carlos White Active Problems Location of Pain Severity and Description of Pain Patient Has Paino No Site Locations Pain Management and Medication Current Pain Management: Electronic Signature(s) Signed: 01/21/2023 3:11:11 PM By: Carlos Sailors RN, BSN Entered By: Carlos White on 01/21/2023 13:11:33 Carlos White ([id number]) [id number] Page 7 of 13 -------------------------------------------------------------------------------- Patient/Caregiver Education Details Patient Name: Date of Service: Carlos White Carlos White Carlos White 1/8/2025andnbsp12:45 PM Medical Record Number: [id number] Patient Account Number: [id number] Date of Birth/Gender: Treating RN: 10/06/1940 (83 y.o. Carlos White Primary Care Physician: Carlos White Other Clinician: Referring Physician: Treating Physician/Extender: Carlos White Carlos White: Carlos White Education Assessment Education Provided To: Patient Education Topics Provided Wound/Skin Impairment: Methods: Explain/Verbal Responses: State content correctly [employer]) Signed: 01/21/2023 3:11:11 PM By: Carlos Sailors RN, BSN Entered By: Carlos White on 01/21/2023 13:14:06 -------------------------------------------------------------------------------- Wound Assessment Details Patient Name: Date of Service: Carlos White Carlos White Carlos White 01/21/2023 12:45 PM Medical Record Number: [id number] Patient Account Number: [id number] Date of Birth/Sex: Treating RN: 05-12-1940 (83 y.o. Carlos White Primary Care Jagjit Riner: Carlos White Other Clinician: Referring Emi Lymon: Treating Tumeka Chimenti/Extender: Carlos White Carlos White: Carlos White Wound Status Wound Number: 2 Primary Venous Leg Ulcer Etiology: Wound Location: Left, Distal, Lateral Lower Leg Wound Status: Open Wounding Event: Gradually Appeared Comorbid Cataracts, Chronic Obstructive Pulmonary Disease  (COPD), Date Acquired: 03/13/2017 History: Hypertension Weeks Of White: Carlos White Clustered Wound: Yes Photos Wound Measurements Length: (cm) 9 Width: (cm) 11 Carlos White, Carlos White ([id number]) Depth: (cm) 0.1 Area: (cm) 77.754 Volume: (cm) 7.775 % Reduction in Area: -0.3% % Reduction in Volume: 79.9% [id number] Page 8 of 13 Epithelialization: None Tunneling: No Undermining: No Wound Description Classification: Full Thickness Without Exposed Suppor Exudate Amount: Medium Exudate Type: Serosanguineous Exudate Color: red, brown t Structures Foul Odor After Cleansing: No Slough/Fibrino No Wound Bed Granulation Amount: Large (67-100%) Exposed Structure Granulation Quality: Red Fascia Exposed: No Necrotic Amount: Small (1-33%) Fat Layer (Subcutaneous Tissue) Exposed: Yes Necrotic Quality: Adherent Slough Tendon Exposed: No Muscle Exposed: No Joint Exposed: No Bone Exposed: No Periwound Skin Texture Texture Color No Abnormalities Noted: No No Abnormalities Noted: No Moisture No Abnormalities Noted: No White Notes Wound #2 (Lower Leg) Wound Laterality: Left, Lateral, Distal Cleanser Soap and Water Discharge Instruction: May shower and wash wound with dial antibacterial soap and water prior to dressing change. Peri-Wound Care Zinc Oxide Ointment 30g tube Discharge Instruction: Apply Zinc Oxide to periwound with each dressing change as needed for maceration Sween Lotion (Moisturizing lotion) Discharge Instruction: Apply moisturizing lotion as directed Topical Gentamicin Discharge Instruction: mix with mupirocin. Mupirocin Ointment Discharge Instruction: mix with gentamici b Ketoconazole  Cream 2% Discharge Instruction: apply in clinic only Triamcinolone Discharge Instruction: apply in clinic only Primary Dressing Maxorb Extra CMC/Alginate Dressing, 4x4 (in/in) Discharge Instruction: Apply to wound bed as instructed Secondary Dressing ABD  Pad, 5x9 Discharge Instruction: Apply over primary dressing as directed. Secured With  Kerlix Roll Sterile, 4.5x3.1 (in/yd) Discharge Instruction: Secure with Kerlix as directed. Compression Wrap tubigrip D one layer Discharge Instruction: apply over the dressing from base of toes to just below the knee. Compression Stockings Add-Ons Electronic Signature(s) Signed: 01/21/2023 3:11:11 PM By: Carlos Sailors RN, BSN [place], VAUGHN White ([id number]) [id number] Page 9 of 13 Entered By: Carlos White on 01/21/2023 13:04:59 -------------------------------------------------------------------------------- Wound Assessment Details Patient Name: Date of Service: Carlos White Carlos White Carlos White 01/21/2023 12:45 PM Medical Record Number: [id number] Patient Account Number: [id number] Date of Birth/Sex: Treating RN: 05/05/1940 (83 y.o. Carlos White Primary Care Deontay Ladnier: Carlos White Other Clinician: Referring Inessa Wardrop: Treating Srihaan Mastrangelo/Extender: Carlos White Carlos White: Carlos White Wound Status Wound Number: 21 Primary Auto-immune Etiology: Wound Location: Left, Medial Ankle Wound Status: Open Wounding Event: Trauma Comorbid Cataracts, Chronic Obstructive Pulmonary Disease (COPD), Date Acquired: 12/09/2022 History: Hypertension Weeks Of White: 5 Clustered Wound: No Photos Wound Measurements Length: (cm) 4 Width: (cm) 3.5 Depth: (cm) 0.1 Area: (cm) 10.996 Volume: (cm) 1.1 % Reduction in Area: 71.3% % Reduction in Volume: 71.3% Epithelialization: Large (67-100%) Tunneling: No Undermining: No Wound Description Classification: Full Thickness Without Exposed Suppor Wound Margin: Flat and Intact Exudate Amount: Medium Exudate Type: Serosanguineous Exudate Color: red, brown t Structures Foul Odor After Cleansing: No Slough/Fibrino No Wound Bed Granulation Amount: Large (67-100%) Exposed Structure Granulation Quality: Red Fascia Exposed:  No Necrotic Amount: None Present (0%) Fat Layer (Subcutaneous Tissue) Exposed: Yes Tendon Exposed: No Muscle Exposed: No Joint Exposed: No Bone Exposed: No Periwound Skin Texture Texture Color No Abnormalities Noted: No No Abnormalities Noted: Yes Scarring: Yes Temperature / Pain Temperature: No Abnormality Moisture No Abnormalities Noted: Yes White Notes Carlos White, Carlos White ([id number]) [phone].pdf Page 10 of 13 Wound #21 (Ankle) Wound Laterality: Left, Medial Cleanser Soap and Water Discharge Instruction: May shower and wash wound with dial antibacterial soap and water prior to dressing change. Peri-Wound Care Zinc Oxide Ointment 30g tube Discharge Instruction: Apply Zinc Oxide to periwound with each dressing change as needed for maceration Sween Lotion (Moisturizing lotion) Discharge Instruction: Apply moisturizing lotion as directed Topical Gentamicin Discharge Instruction: mix with mupirocin. Mupirocin Ointment Discharge Instruction: mix with gentamici b Ketoconazole  Cream 2% Discharge Instruction: apply in clinic only Triamcinolone Discharge Instruction: apply in clinic only Primary Dressing Maxorb Extra CMC/Alginate Dressing, 4x4 (in/in) Discharge Instruction: Apply to wound bed as instructed Secondary Dressing ABD Pad, 5x9 Discharge Instruction: Apply over primary dressing as directed. Secured With [employer], 4.5x3.1 (in/yd) Discharge Instruction: Secure with Kerlix as directed. Compression Wrap tubigrip D one layer Discharge Instruction: apply over the dressing from base of toes to just below the knee. Compression Stockings Add-Ons Electronic Signature(s) Signed: 01/21/2023 3:11:11 PM By: Carlos Sailors RN, BSN Entered By: Carlos White on 01/21/2023 13:02:58 -------------------------------------------------------------------------------- Wound Assessment Details Patient Name: Date of Service: Carlos White Carlos White Carlos White  01/21/2023 12:45 PM Medical Record Number: [id number] Patient Account Number: [id number] Date of Birth/Sex: Treating RN: 07/15/1940 (83 y.o. Carlos White Primary Care Dylanie Quesenberry: Carlos White Other Clinician: Referring Teddie Curd: Treating Raymon Schlarb/Extender: Carlos White Carlos White: Carlos White Wound Status Wound Number: 22 Primary Auto-immune Etiology: Wound Location: Left, Posterior Lower Leg Wound Status: Healed - Epithelialized Wounding Event: Trauma Comorbid Cataracts, Chronic Obstructive Pulmonary Disease (COPD), Date Acquired: 12/09/2022 History: Hypertension Weeks Of White: 5 Clustered Wound: No Wound Measurements Length: (cm) Massman, Tami White ([id number]) Width: (cm) Depth: (cm) Area: (cm) Volume: (cm) 0 % Reduction in Area: 100% [id number] Page 11 of  13 0 % Reduction in Volume: 100% 0 Epithelialization: Large (67-100%) 0 Tunneling: No 0 Undermining: No Wound Description Classification: Full Thickness Without Exposed Support Structures Wound Margin: Flat and Intact Exudate Amount: None Present Foul Odor After Cleansing: No Slough/Fibrino No Wound Bed Granulation Amount: None Present (0%) Exposed Structure Necrotic Amount: None Present (0%) Fascia Exposed: No Fat Layer (Subcutaneous Tissue) Exposed: No Tendon Exposed: No Muscle Exposed: No Joint Exposed: No Bone Exposed: No Periwound Skin Texture Texture Color No Abnormalities Noted: No No Abnormalities Noted: Yes Scarring: Yes Temperature / Pain Temperature: No Abnormality Moisture No Abnormalities Noted: Yes Electronic Signature(s) Signed: 01/21/2023 3:11:11 PM By: Carlos Sailors RN, BSN Entered By: Carlos White on 01/21/2023 13:06:32 -------------------------------------------------------------------------------- Wound Assessment Details Patient Name: Date of Service: Carlos White PALL RDO Carlos White. 01/21/2023 12:45 PM Medical Record Number: [id number] Patient  Account Number: [id number] Date of Birth/Sex: Treating RN: 10-03-1940 (83 y.o. Carlos White Primary Care Brinleigh Tew: Carlos White Other Clinician: Referring Ikea Demicco: Treating Shey Bartmess/Extender: Carlos White Carlos White: Carlos White Wound Status Wound Number: 5 Primary Vasculopathy Etiology: Wound Location: Right, Lateral, Anterior Lower Leg Wound Status: Open Wounding Event: Gradually Appeared Comorbid Cataracts, Chronic Obstructive Pulmonary Disease (COPD), Date Acquired: 10/26/2017 History: Hypertension Weeks Of White: 273 Clustered Wound: Yes Photos Wound Measurements Length: (cm) 3.4 Width: (cm) 2.2 Depth: (cm) 0.1 Alexa, Demetrion White ([id number]) Area: (cm) 5.875 Volume: (cm) 0.587 % Reduction in Area: 89.2% % Reduction in Volume: 89.2% Epithelialization: Large (67-100%) [id number] Page 12 of 13 Tunneling: No Undermining: No Wound Description Classification: Full Thickness Without Exposed Suppor Exudate Amount: Medium Exudate Type: Serosanguineous Exudate Color: red, brown t Structures Foul Odor After Cleansing: No Slough/Fibrino Yes Wound Bed Granulation Amount: Large (67-100%) Exposed Structure Granulation Quality: Red Fat Layer (Subcutaneous Tissue) Exposed: Yes Necrotic Amount: Small (1-33%) Necrotic Quality: Adherent Slough Periwound Skin Texture Texture Color No Abnormalities Noted: No No Abnormalities Noted: No Moisture No Abnormalities Noted: No White Notes Wound #5 (Lower Leg) Wound Laterality: Right, Lateral, Anterior Cleanser Soap and Water Discharge Instruction: May shower and wash wound with dial antibacterial soap and water prior to dressing change. Peri-Wound Care Zinc Oxide Ointment 30g tube Discharge Instruction: Apply Zinc Oxide to periwound with each dressing change as needed for maceration Sween Lotion (Moisturizing lotion) Discharge Instruction: Apply moisturizing lotion as  directed Topical Gentamicin Discharge Instruction: mix with mupirocin. Mupirocin Ointment Discharge Instruction: mix with gentamici b Ketoconazole  Cream 2% Discharge Instruction: apply in clinic only Triamcinolone Discharge Instruction: apply in clinic only Primary Dressing Maxorb Extra CMC/Alginate Dressing, 4x4 (in/in) Discharge Instruction: Apply to wound bed as instructed Secondary Dressing ABD Pad, 5x9 Discharge Instruction: Apply over primary dressing as directed. Secured With [employer], 4.5x3.1 (in/yd) Discharge Instruction: Secure with Kerlix as directed. Compression Wrap tubigrip D one layer Discharge Instruction: apply over the dressing from base of toes to just below the knee. Compression Stockings Add-Ons Electronic Signature(s) Signed: 01/21/2023 3:11:11 PM By: Carlos Sailors RN, BSN Entered By: Carlos White on 01/21/2023 13:08:07 Carlos White ([id number]) [id number] Page 13 of 13 -------------------------------------------------------------------------------- Vitals Details Patient Name: Date of Service: Carlos White PALL White Carlos White 01/21/2023 12:45 PM Medical Record Number: [id number] Patient Account Number: [id number] Date of Birth/Sex: Treating RN: 12/22/1940 (83 y.o. Carlos White Primary Care Gae Bihl: Carlos White Other Clinician: Referring Metha Kolasa: Treating Merrell Borsuk/Extender: Carlos White Carlos White: Carlos White Vital Signs Time Taken: 13:00 Temperature (F): 98.0 Height (in): 71 Pulse (bpm): 69 Weight (lbs): 220 Respiratory Rate (breaths/min): 18  Body Carlos White Index (BMI): 30.7 Blood Pressure (mmHg): 161/78 Reference Range: 80 - 120 mg / dl Electronic Signature(s) Signed: 01/21/2023 3:11:11 PM By: Carlos Sailors RN, BSN Entered By: Carlos White on 01/21/2023 13:11:23

## 2023-01-23 DIAGNOSIS — X32XXXD Exposure to sunlight, subsequent encounter: Secondary | ICD-10-CM | POA: Diagnosis not present

## 2023-01-23 DIAGNOSIS — L570 Actinic keratosis: Secondary | ICD-10-CM | POA: Diagnosis not present

## 2023-01-23 DIAGNOSIS — I890 Lymphedema, not elsewhere classified: Secondary | ICD-10-CM | POA: Diagnosis not present

## 2023-01-28 ENCOUNTER — Encounter (HOSPITAL_BASED_OUTPATIENT_CLINIC_OR_DEPARTMENT_OTHER): Payer: Medicare PPO | Admitting: Internal Medicine

## 2023-01-28 DIAGNOSIS — L97822 Non-pressure chronic ulcer of other part of left lower leg with fat layer exposed: Secondary | ICD-10-CM | POA: Diagnosis not present

## 2023-01-28 DIAGNOSIS — B372 Candidiasis of skin and nail: Secondary | ICD-10-CM

## 2023-01-28 DIAGNOSIS — I87312 Chronic venous hypertension (idiopathic) with ulcer of left lower extremity: Secondary | ICD-10-CM | POA: Diagnosis not present

## 2023-01-28 DIAGNOSIS — L97812 Non-pressure chronic ulcer of other part of right lower leg with fat layer exposed: Secondary | ICD-10-CM | POA: Diagnosis not present

## 2023-01-28 DIAGNOSIS — I87313 Chronic venous hypertension (idiopathic) with ulcer of bilateral lower extremity: Secondary | ICD-10-CM | POA: Diagnosis not present

## 2023-01-28 DIAGNOSIS — Q818 Other epidermolysis bullosa: Secondary | ICD-10-CM | POA: Diagnosis not present

## 2023-02-03 NOTE — Progress Notes (Signed)
RAYMONT, LUBRANO ([id number]) [id number] Page 1 of 12 Visit Report for 01/28/2023 Arrival Information Details Patient Name: Date of Service: Carlos White 01/28/2023 1:15 PM Medical Record Number: [id number] Patient Account Number: [id number] Date of Birth/Sex: Treating RN: [birth year] (83 y.o. M) Primary Care Sharis Keeran: Marlan Palau Other Clinician: Referring Areebah Meinders: Treating Malyia Moro/Extender: Carlos White in Treatment: 278 Visit Information History Since Last Visit Added or deleted any medications: No Patient Arrived: Cane Any new allergies or adverse reactions: No Arrival Time: 13:21 Had a fall or experienced change in No Accompanied By: self activities of daily living that may affect Transfer Assistance: None risk of falls: Patient Identification Verified: Yes Signs or symptoms of abuse/neglect since last visito No Secondary Verification Process Completed: Yes Hospitalized since last visit: No Patient Requires Transmission-Based Precautions: No Implantable device outside of the clinic excluding No Patient Has Alerts: Yes cellular tissue based products placed in the center Patient Alerts: R ABI= 1.23, TBI = .86 since last visit: L ABI= 1.28, TBI=.81 Has Dressing in Place as Prescribed: Yes Has Compression in Place as Prescribed: Yes Pain Present Now: No Electronic Signature(s) Signed: 02/03/2023 4:04:36 PM By: Thayer Dallas Entered By: Thayer Dallas on 01/28/2023 13:33:36 -------------------------------------------------------------------------------- Clinic Level of Care Assessment Details Patient Name: Date of Service: Carlos White 01/28/2023 1:15 PM Medical Record Number: [id number] Patient Account Number: [id number] Date of Birth/Sex: Treating RN: 05-12-1940 (83 y.o. Carlos White Primary Care Macrae Wiegman: Marlan Palau Other Clinician: Referring Walden Statz: Treating Karanvir Balderston/Extender: Carlos White in Treatment: 278 Clinic Level of Care Assessment Items TOOL 1 Quantity Score X- 1 0 Use when EandM and Procedure is performed on INITIAL visit ASSESSMENTS - Nursing Assessment / Reassessment X- 1 20 General Physical Exam (combine w/ comprehensive assessment (listed just below) when performed on new pt. evals) X- 1 25 Comprehensive Assessment (HX, ROS, Risk Assessments, Wounds Hx, etc.) ASSESSMENTS - Wound and Skin Assessment / Reassessment X- 1 10 Dermatologic / Skin Assessment (not related to wound area) ASSESSMENTS - Ostomy and/or Continence Assessment and Care []  - 0 Incontinence Assessment and Management []  - 0 Ostomy Care Assessment and Management (repouching, etc.) PROCESS - Coordination of Care X - Simple Patient / Family Education for ongoing care 1 15 [place], Hendricks White ([id number]) [id number] Page 2 of 12 []  - 0 Complex (extensive) Patient / Family Education for ongoing care X- 1 10 Staff obtains Consents, Records, T Results / Process Orders est X- 1 10 Staff telephones HHA, Nursing Homes / Clarify orders / etc []  - 0 Routine Transfer to another Facility (non-emergent condition) []  - 0 Routine Hospital Admission (non-emergent condition) X- 1 15 New Admissions / Manufacturing engineer / Ordering NPWT Apligraf, etc. , []  - 0 Emergency Hospital Admission (emergent condition) PROCESS - Special Needs []  - 0 Pediatric / Minor Patient Management []  - 0 Isolation Patient Management []  - 0 Hearing / Language / Visual special needs []  - 0 Assessment of Community assistance (transportation, D/C planning, etc.) []  - 0 Additional assistance / Altered mentation []  - 0 Support Surface(s) Assessment (bed, cushion, seat, etc.) INTERVENTIONS - Miscellaneous []  - 0 External ear exam []  - 0 Patient Transfer (multiple staff / Nurse, adult / Similar devices) []  - 0 Simple Staple / Suture removal (25 or less) []  - 0 Complex Staple /  Suture removal (26 or more) []  - 0 Hypo/Hyperglycemic Management (do not check if billed separately) []  - 0 Ankle / Brachial  Index (ABI) - do not check if billed separately Has the patient been seen at the hospital within the last three years: Yes Total Score: 105 Level Of Care: New/Established - Level 3 Electronic Signature(s) Signed: 01/28/2023 5:19:30 PM By: Karie Schwalbe RN Entered By: Karie Schwalbe on 01/28/2023 17:17:22 -------------------------------------------------------------------------------- Encounter Discharge Information Details Patient Name: Date of Service: Carlos White, GO RDO [name]. 01/28/2023 1:15 PM Medical Record Number: [id number] Patient Account Number: [id number] Date of Birth/Sex: Treating RN: 04-Nov-1940 (83 y.o. Carlos White Primary Care Rumaysa Sabatino: Marlan Palau Other Clinician: Referring Paelyn Smick: Treating Danie Hannig/Extender: Carlos White in Treatment: [phone] Encounter Discharge Information Items Discharge Condition: Stable Ambulatory Status: Walker Discharge Destination: Home Transportation: Private Auto Accompanied By: self Schedule Follow-up Appointment: Yes Clinical Summary of Care: Patient Declined Electronic Signature(s) Signed: 01/28/2023 5:19:30 PM By: Karie Schwalbe RN Entered By: Karie Schwalbe on 01/28/2023 17:18:37 Carlos White ([id number]) [id number] Page 3 of 12 -------------------------------------------------------------------------------- Lower Extremity Assessment Details Patient Name: Date of Service: Carlos White 01/28/2023 1:15 PM Medical Record Number: [id number] Patient Account Number: [id number] Date of Birth/Sex: Treating RN: 01/19/1940 (83 y.o. M) Primary Care Alejandro Adcox: Marlan Palau Other Clinician: Referring Magic Mohler: Treating Istvan Behar/Extender: Carlos White in Treatment: 278 Edema Assessment Assessed: [Left: No] [Right: No] Edema: [Left: Yes]  [Right: Yes] Calf Left: Right: Point of Measurement: 35.5 cm From Medial Instep 38.5 cm 36.1 cm Ankle Left: Right: Point of Measurement: 11 cm From Medial Instep 26 cm 23.5 cm Vascular Assessment Extremity colors, hair growth, and conditions: Extremity Color: [Left:Hyperpigmented] [Right:Hyperpigmented] Hair Growth on Extremity: [Left:No] [Right:No] Temperature of Extremity: [Left:Warm] [Right:Warm] Capillary Refill: [Left:< 3 seconds] [Right:< 3 seconds] Dependent Rubor: [Left:No No] [Right:No No] Electronic Signature(s) Signed: 02/03/2023 4:04:36 PM By: Thayer Dallas Entered By: Thayer Dallas on 01/28/2023 13:34:15 -------------------------------------------------------------------------------- Multi Wound Chart Details Patient Name: Date of Service: Carlos White, GO RDO [name]. 01/28/2023 1:15 PM Medical Record Number: [id number] Patient Account Number: [id number] Date of Birth/Sex: Treating RN: 04/05/1940 (83 y.o. M) Primary Care Amera Banos: Marlan Palau Other Clinician: Referring Clay Solum: Treating Precious Gilchrest/Extender: Carlos White in Treatment: 278 Vital Signs Height(in): 71 Pulse(bpm): 76 Weight(lbs): 220 Blood Pressure(mmHg): 158/76 Body Mass Index(BMI): 30.7 Temperature(F): 97.6 Respiratory Rate(breaths/min): 18 [2:Photos:] [phone].pdf Page 4 of 12] Left, Distal, Lateral Lower Leg Left, Medial Ankle Right, Lateral, Anterior Lower Leg Wound Location: Gradually Appeared Trauma Gradually Appeared Wounding Event: Venous Leg Ulcer Auto-immune Vasculopathy Primary Etiology: Cataracts, Chronic Obstructive Cataracts, Chronic Obstructive Cataracts, Chronic Obstructive Comorbid History: Pulmonary Disease (COPD), Pulmonary Disease (COPD), Pulmonary Disease (COPD), Hypertension Hypertension Hypertension 03/13/2017 12/09/2022 10/26/2017 Date Acquired: 278 6 274 Weeks of Treatment: Open Open Open Wound Status: No No No Wound  Recurrence: Yes No Yes Clustered Wound: 2 N/A 1 Clustered Quantity: 13.5x7.5x0.1 1.2x3x0.1 2x2.5x0.1 Measurements L x W x D (cm) 79.522 2.827 3.927 A (cm) : rea 7.952 0.283 0.393 Volume (cm) : -2.60% 92.60% 92.80% % Reduction in Area: 79.50% 92.60% 92.80% % Reduction in Volume: Full Thickness Without Exposed Full Thickness Without Exposed Full Thickness Without Exposed Classification: Support Structures Support Structures Support Structures Medium Medium Medium Exudate Amount: Serosanguineous Serosanguineous Serosanguineous Exudate Type: red, brown red, brown red, brown Exudate Color: N/A Flat and Intact N/A Wound Margin: Medium (34-66%) Large (67-100%) Large (67-100%) Granulation Amount: Red Red Red Granulation Quality: Medium (34-66%) Small (1-33%) Small (1-33%) Necrotic Amount: Fat Layer (Subcutaneous Tissue): Yes Fat Layer (Subcutaneous Tissue): Yes Fat Layer (Subcutaneous Tissue): Yes Exposed Structures: Fascia: No Fascia:  No Tendon: No Tendon: No Muscle: No Muscle: No Joint: No Joint: No Bone: No Bone: No None Large (67-100%) Large (67-100%) Epithelialization: Excoriation: No Scarring: Yes Excoriation: No Periwound Skin Texture: Induration: No Induration: No Callus: No Callus: No Crepitus: No Crepitus: No Rash: No Rash: No Scarring: No Scarring: No Dry/Scaly: Yes Dry/Scaly: Yes Maceration: No Periwound Skin Moisture: Maceration: No Dry/Scaly: No Atrophie Blanche: No No Abnormalities Noted Atrophie Blanche: No Periwound Skin Color: Cyanosis: No Cyanosis: No Ecchymosis: No Ecchymosis: No Erythema: No Erythema: No Hemosiderin Staining: No Hemosiderin Staining: No Mottled: No Mottled: No Pallor: No Pallor: No Rubor: No Rubor: No N/A No Abnormality No Abnormality Temperature: Treatment Notes Electronic Signature(s) Signed: 01/28/2023 4:27:59 PM By: Geralyn Corwin DO Entered By: Geralyn Corwin on 01/28/2023 14:22:32 Carlos White ([id number]) [id number] Page 5 of 12 -------------------------------------------------------------------------------- Multi-Disciplinary Care Plan Details Patient Name: Date of Service: Carlos White 01/28/2023 1:15 PM Medical Record Number: [id number] Patient Account Number: [id number] Date of Birth/Sex: Treating RN: 1940/06/01 (83 y.o. Carlos White Primary Care Eliyana Pagliaro: Marlan Palau Other Clinician: Referring Taedyn Glasscock: Treating Taje Tondreau/Extender: Carlos White in Treatment: 278 Multidisciplinary Care Plan reviewed with physician Active Inactive Venous Leg Ulcer Nursing Diagnoses: Knowledge deficit related to disease process and management Potential for venous Insuffiency (use before diagnosis confirmed) Goals: Patient will maintain optimal edema control Date Initiated: 09/30/2017 Target Resolution Date: 03/12/2023 Goal Status: Active Patient/caregiver will verbalize understanding of disease process and disease management Date Initiated: 09/30/2017 Date Inactivated: 11/04/2017 Target Resolution Date: 10/30/2017 Goal Status: Met Interventions: Assess peripheral edema status every visit. Provide education on venous insufficiency Notes: Electronic Signature(s) Signed: 01/28/2023 5:19:30 PM By: Karie Schwalbe RN Entered By: Karie Schwalbe on 01/28/2023 17:16:25 -------------------------------------------------------------------------------- Pain Assessment Details Patient Name: Date of Service: Carlos White 01/28/2023 1:15 PM Medical Record Number: [id number] Patient Account Number: [id number] Date of Birth/Sex: Treating RN: 1940-10-13 (83 y.o. M) Primary Care Garrette Caine: Marlan Palau Other Clinician: Referring Jaamal Farooqui: Treating Kyrie Bun/Extender: Carlos White in Treatment: 278 Active Problems Location of Pain Severity and Description of Pain Patient Has Paino No Site  Locations [place], [place] ([id number]) [id number] Page 6 of 12 Pain Management and Medication Current Pain Management: Electronic Signature(s) Signed: 02/03/2023 4:04:36 PM By: Thayer Dallas Entered By: Thayer Dallas on 01/28/2023 13:24:32 -------------------------------------------------------------------------------- Patient/Caregiver Education Details Patient Name: Date of Service: Carlos White 1/15/2025andnbsp1:15 PM Medical Record Number: [id number] Patient Account Number: [id number] Date of Birth/Gender: Treating RN: 08-13-1940 (83 y.o. Carlos White Primary Care Physician: Marlan Palau Other Clinician: Referring Physician: Treating Physician/Extender: Carlos White in Treatment: [phone] Education Assessment Education Provided To: Patient Education Topics Provided Wound/Skin Impairment: Methods: Explain/Verbal Responses: State content correctly [employer]) Signed: 01/28/2023 5:19:30 PM By: Karie Schwalbe RN Entered By: Karie Schwalbe on 01/28/2023 17:16:46 -------------------------------------------------------------------------------- Wound Assessment Details Patient Name: Date of Service: Carlos White 01/28/2023 1:15 PM Medical Record Number: [id number] Patient Account Number: [id number] Date of Birth/Sex: Treating RN: 1940/10/27 (83 y.o. M) Primary Care Conception Doebler: Marlan Palau Other Clinician: Referring Jamir Rone: Treating Effie Wahlert/Extender: Carlos White, Carlos White ([id number]) [id number] Page 7 of 12 Weeks in Treatment: 278 Wound Status Wound Number: 2 Primary Venous Leg Ulcer Etiology: Wound Location: Left, Distal, Lateral Lower Leg Wound Status: Open Wounding Event: Gradually Appeared Comorbid Cataracts, Chronic Obstructive Pulmonary Disease (COPD), Date Acquired: 03/13/2017 History: Hypertension Weeks Of Treatment: 278 Clustered Wound:  Yes Photos Wound Measurements  Length: (cm) Width: (cm) Depth: (cm) Clustered Quantity: Area: (cm) Volume: (cm) 13.5 % Reduction in Area: -2.6% 7.5 % Reduction in Volume: 79.5% 0.1 Epithelialization: None 2 Tunneling: No 79.522 Undermining: No 7.952 Wound Description Classification: Full Thickness Without Exposed Sup Exudate Amount: Medium Exudate Type: Serosanguineous Exudate Color: red, brown port Structures Foul Odor After Cleansing: No Slough/Fibrino Yes Wound Bed Granulation Amount: Medium (34-66%) Exposed Structure Granulation Quality: Red Fascia Exposed: No Necrotic Amount: Medium (34-66%) Fat Layer (Subcutaneous Tissue) Exposed: Yes Necrotic Quality: Adherent Slough Tendon Exposed: No Muscle Exposed: No Joint Exposed: No Bone Exposed: No Periwound Skin Texture Texture Color No Abnormalities Noted: No No Abnormalities Noted: No Callus: No Atrophie Blanche: No Crepitus: No Cyanosis: No Excoriation: No Ecchymosis: No Induration: No Erythema: No Rash: No Hemosiderin Staining: No Scarring: No Mottled: No Pallor: No Moisture Rubor: No No Abnormalities Noted: No Dry / Scaly: Yes Maceration: No Treatment Notes Wound #2 (Lower Leg) Wound Laterality: Left, Lateral, Distal Cleanser Soap and Water Discharge Instruction: May shower and wash wound with dial antibacterial soap and water prior to dressing change. Peri-Wound Care Zinc Oxide Ointment 30g tube Discharge Instruction: Apply Zinc Oxide to periwound with each dressing change as needed for maceration Sween Lotion (Moisturizing lotion) Carlos White, Carlos White ([id number]) [id number] Page 8 of 12 Discharge Instruction: Apply moisturizing lotion as directed Topical Gentamicin Discharge Instruction: mix with mupirocin. Mupirocin Ointment Discharge Instruction: mix with gentamici b Ketoconazole Cream 2% Discharge Instruction: apply in clinic only Triamcinolone Discharge  Instruction: apply in clinic only Primary Dressing Maxorb Extra CMC/Alginate Dressing, 4x4 (in/in) Discharge Instruction: Apply to wound bed as instructed Secondary Dressing ABD Pad, 5x9 Discharge Instruction: Apply over primary dressing as directed. Secured With [employer], 4.5x3.1 (in/yd) Discharge Instruction: Secure with Kerlix as directed. Compression Wrap tubigrip D one layer Discharge Instruction: apply over the dressing from base of toes to just below the knee. Compression Stockings Add-Ons Electronic Signature(s) Signed: 02/03/2023 4:04:36 PM By: Thayer Dallas Entered By: Thayer Dallas on 01/28/2023 13:44:34 -------------------------------------------------------------------------------- Wound Assessment Details Patient Name: Date of Service: Carlos Darling RDO [name] 01/28/2023 1:15 PM Medical Record Number: [id number] Patient Account Number: [id number] Date of Birth/Sex: Treating RN: 1940-12-04 (83 y.o. M) Primary Care Davin Archuletta: Marlan Palau Other Clinician: Referring Fares Ramthun: Treating Genine Beckett/Extender: Carlos White in Treatment: 278 Wound Status Wound Number: 21 Primary Auto-immune Etiology: Wound Location: Left, Medial Ankle Wound Status: Open Wounding Event: Trauma Comorbid Cataracts, Chronic Obstructive Pulmonary Disease (COPD), Date Acquired: 12/09/2022 History: Hypertension Weeks Of Treatment: 6 Clustered Wound: No Photos Carlos White, Carlos White ([id number]) [phone].pdf Page 9 of 12 Wound Measurements Length: (cm) 1.2 Width: (cm) 3 Depth: (cm) 0.1 Area: (cm) 2.827 Volume: (cm) 0.283 % Reduction in Area: 92.6% % Reduction in Volume: 92.6% Epithelialization: Large (67-100%) Tunneling: No Undermining: No Wound Description Classification: Full Thickness Without Exposed Support Structures Wound Margin: Flat and Intact Exudate Amount: Medium Exudate Type: Serosanguineous Exudate Color: red, brown Foul  Odor After Cleansing: No Slough/Fibrino No Wound Bed Granulation Amount: Large (67-100%) Exposed Structure Granulation Quality: Red Fascia Exposed: No Necrotic Amount: Small (1-33%) Fat Layer (Subcutaneous Tissue) Exposed: Yes Necrotic Quality: Adherent Slough Tendon Exposed: No Muscle Exposed: No Joint Exposed: No Bone Exposed: No Periwound Skin Texture Texture Color No Abnormalities Noted: No No Abnormalities Noted: Yes Scarring: Yes Temperature / Pain Temperature: No Abnormality Moisture No Abnormalities Noted: No Dry / Scaly: Yes Treatment Notes Wound #21 (Ankle) Wound Laterality: Left, Medial Cleanser Soap and Water Discharge Instruction: May shower and  wash wound with dial antibacterial soap and water prior to dressing change. Peri-Wound Care Zinc Oxide Ointment 30g tube Discharge Instruction: Apply Zinc Oxide to periwound with each dressing change as needed for maceration Sween Lotion (Moisturizing lotion) Discharge Instruction: Apply moisturizing lotion as directed Topical Gentamicin Discharge Instruction: mix with mupirocin. Mupirocin Ointment Discharge Instruction: mix with gentamici b Ketoconazole Cream 2% Discharge Instruction: apply in clinic only Triamcinolone Discharge Instruction: apply in clinic only Primary Dressing Maxorb Extra CMC/Alginate Dressing, 4x4 (in/in) Discharge Instruction: Apply to wound bed as instructed Carlos White, Carlos White ([id number]) [phone].pdf Page 10 of 12 Secondary Dressing ABD Pad, 5x9 Discharge Instruction: Apply over primary dressing as directed. Secured With [employer], 4.5x3.1 (in/yd) Discharge Instruction: Secure with Kerlix as directed. Compression Wrap tubigrip D one layer Discharge Instruction: apply over the dressing from base of toes to just below the knee. Compression Stockings Add-Ons Electronic Signature(s) Signed: 02/03/2023 4:04:36 PM By: Thayer Dallas Entered By: Thayer Dallas on 01/28/2023 13:43:49 -------------------------------------------------------------------------------- Wound Assessment Details Patient Name: Date of Service: Carlos Darling RDO [name] 01/28/2023 1:15 PM Medical Record Number: [id number] Patient Account Number: [id number] Date of Birth/Sex: Treating RN: 07-Jun-1940 (83 y.o. M) Primary Care Ayren Zumbro: Marlan Palau Other Clinician: Referring Shatoria Stooksbury: Treating Jaya Lapka/Extender: Carlos White in Treatment: 278 Wound Status Wound Number: 5 Primary Vasculopathy Etiology: Wound Location: Right, Lateral, Anterior Lower Leg Wound Status: Open Wounding Event: Gradually Appeared Comorbid Cataracts, Chronic Obstructive Pulmonary Disease (COPD), Date Acquired: 10/26/2017 History: Hypertension Weeks Of Treatment: 274 Clustered Wound: Yes Photos Wound Measurements Length: (cm) Width: (cm) Depth: (cm) Clustered Quantity: Area: (cm) Volume: (cm) 2 % Reduction in Area: 92.8% 2.5 % Reduction in Volume: 92.8% 0.1 Epithelialization: Large (67-100%) 1 Tunneling: No 3.927 Undermining: No 0.393 Wound Description Classification: Full Thickness Without Exposed Sup Exudate Amount: Medium Exudate Type: Serosanguineous Exudate Color: red, brown port Structures Foul Odor After Cleansing: No Slough/Fibrino Yes Wound Bed Carlos White, Carlos White ([id number]) [phone].pdf Page 11 of 12 Granulation Amount: Large (67-100%) Exposed Structure Granulation Quality: Red Fat Layer (Subcutaneous Tissue) Exposed: Yes Necrotic Amount: Small (1-33%) Necrotic Quality: Adherent Slough Periwound Skin Texture Texture Color No Abnormalities Noted: Yes No Abnormalities Noted: No Atrophie Blanche: No Moisture Cyanosis: No No Abnormalities Noted: No Ecchymosis: No Dry / Scaly: No Erythema: No Maceration: No Hemosiderin Staining: No Mottled: No Pallor: No Rubor: No Temperature / Pain Temperature: No  Abnormality Treatment Notes Wound #5 (Lower Leg) Wound Laterality: Right, Lateral, Anterior Cleanser Soap and Water Discharge Instruction: May shower and wash wound with dial antibacterial soap and water prior to dressing change. Peri-Wound Care Zinc Oxide Ointment 30g tube Discharge Instruction: Apply Zinc Oxide to periwound with each dressing change as needed for maceration Sween Lotion (Moisturizing lotion) Discharge Instruction: Apply moisturizing lotion as directed Topical Gentamicin Discharge Instruction: mix with mupirocin. Mupirocin Ointment Discharge Instruction: mix with gentamici b Ketoconazole Cream 2% Discharge Instruction: apply in clinic only Triamcinolone Discharge Instruction: apply in clinic only Primary Dressing Maxorb Extra CMC/Alginate Dressing, 4x4 (in/in) Discharge Instruction: Apply to wound bed as instructed Secondary Dressing ABD Pad, 5x9 Discharge Instruction: Apply over primary dressing as directed. Secured With [employer], 4.5x3.1 (in/yd) Discharge Instruction: Secure with Kerlix as directed. Compression Wrap tubigrip D one layer Discharge Instruction: apply over the dressing from base of toes to just below the knee. Compression Stockings Add-Ons Electronic Signature(s) Signed: 02/03/2023 4:04:36 PM By: Thayer Dallas Entered By: Thayer Dallas on 01/28/2023 13:44:14 Carlos White ([id number]) [id number] Page 12 of 12 --------------------------------------------------------------------------------  Vitals Details Patient Name: Date of Service: Carlos White 01/28/2023 1:15 PM Medical Record Number: [id number] Patient Account Number: [id number] Date of Birth/Sex: Treating RN: March 30, 1940 (83 y.o. M) Primary Care Shanikqua Zarzycki: Marlan Palau Other Clinician: Referring Duvan Mousel: Treating Isadore Bokhari/Extender: Carlos White in Treatment: 278 Vital Signs Time Taken: 13:33 Temperature (F):  97.6 Height (in): 71 Pulse (bpm): 76 Weight (lbs): 220 Respiratory Rate (breaths/min): 18 Body Mass Index (BMI): 30.7 Blood Pressure (mmHg): 158/76 Reference Range: 80 - 120 mg / dl Electronic Signature(s) Signed: 02/03/2023 4:04:36 PM By: Thayer Dallas Entered By: Thayer Dallas on 01/28/2023 13:33:52

## 2023-02-04 ENCOUNTER — Ambulatory Visit (HOSPITAL_BASED_OUTPATIENT_CLINIC_OR_DEPARTMENT_OTHER): Payer: Medicare PPO | Admitting: Internal Medicine

## 2023-02-05 ENCOUNTER — Encounter (HOSPITAL_BASED_OUTPATIENT_CLINIC_OR_DEPARTMENT_OTHER): Payer: Medicare PPO | Admitting: Internal Medicine

## 2023-02-05 DIAGNOSIS — L97812 Non-pressure chronic ulcer of other part of right lower leg with fat layer exposed: Secondary | ICD-10-CM | POA: Diagnosis not present

## 2023-02-05 DIAGNOSIS — I87311 Chronic venous hypertension (idiopathic) with ulcer of right lower extremity: Secondary | ICD-10-CM | POA: Diagnosis not present

## 2023-02-05 DIAGNOSIS — L97822 Non-pressure chronic ulcer of other part of left lower leg with fat layer exposed: Secondary | ICD-10-CM | POA: Diagnosis not present

## 2023-02-05 DIAGNOSIS — B372 Candidiasis of skin and nail: Secondary | ICD-10-CM | POA: Diagnosis not present

## 2023-02-05 DIAGNOSIS — I87312 Chronic venous hypertension (idiopathic) with ulcer of left lower extremity: Secondary | ICD-10-CM

## 2023-02-05 DIAGNOSIS — Q818 Other epidermolysis bullosa: Secondary | ICD-10-CM | POA: Diagnosis not present

## 2023-02-05 DIAGNOSIS — I87313 Chronic venous hypertension (idiopathic) with ulcer of bilateral lower extremity: Secondary | ICD-10-CM | POA: Diagnosis not present

## 2023-02-10 ENCOUNTER — Encounter (HOSPITAL_BASED_OUTPATIENT_CLINIC_OR_DEPARTMENT_OTHER): Payer: Medicare PPO | Admitting: Internal Medicine

## 2023-02-10 DIAGNOSIS — B372 Candidiasis of skin and nail: Secondary | ICD-10-CM | POA: Diagnosis not present

## 2023-02-10 DIAGNOSIS — L97812 Non-pressure chronic ulcer of other part of right lower leg with fat layer exposed: Secondary | ICD-10-CM

## 2023-02-10 DIAGNOSIS — I87311 Chronic venous hypertension (idiopathic) with ulcer of right lower extremity: Secondary | ICD-10-CM | POA: Diagnosis not present

## 2023-02-10 DIAGNOSIS — I87313 Chronic venous hypertension (idiopathic) with ulcer of bilateral lower extremity: Secondary | ICD-10-CM | POA: Diagnosis not present

## 2023-02-10 DIAGNOSIS — Q818 Other epidermolysis bullosa: Secondary | ICD-10-CM | POA: Diagnosis not present

## 2023-02-10 DIAGNOSIS — I87312 Chronic venous hypertension (idiopathic) with ulcer of left lower extremity: Secondary | ICD-10-CM | POA: Diagnosis not present

## 2023-02-10 DIAGNOSIS — L97822 Non-pressure chronic ulcer of other part of left lower leg with fat layer exposed: Secondary | ICD-10-CM

## 2023-02-17 ENCOUNTER — Encounter (HOSPITAL_BASED_OUTPATIENT_CLINIC_OR_DEPARTMENT_OTHER): Payer: Medicare PPO | Attending: Internal Medicine | Admitting: Internal Medicine

## 2023-02-17 DIAGNOSIS — L97812 Non-pressure chronic ulcer of other part of right lower leg with fat layer exposed: Secondary | ICD-10-CM | POA: Diagnosis not present

## 2023-02-17 DIAGNOSIS — I87312 Chronic venous hypertension (idiopathic) with ulcer of left lower extremity: Secondary | ICD-10-CM | POA: Diagnosis not present

## 2023-02-17 DIAGNOSIS — L97822 Non-pressure chronic ulcer of other part of left lower leg with fat layer exposed: Secondary | ICD-10-CM

## 2023-02-17 DIAGNOSIS — L97322 Non-pressure chronic ulcer of left ankle with fat layer exposed: Secondary | ICD-10-CM | POA: Diagnosis not present

## 2023-02-17 DIAGNOSIS — Q819 Epidermolysis bullosa, unspecified: Secondary | ICD-10-CM | POA: Insufficient documentation

## 2023-02-17 DIAGNOSIS — I87313 Chronic venous hypertension (idiopathic) with ulcer of bilateral lower extremity: Secondary | ICD-10-CM

## 2023-02-17 DIAGNOSIS — I87311 Chronic venous hypertension (idiopathic) with ulcer of right lower extremity: Secondary | ICD-10-CM

## 2023-02-18 ENCOUNTER — Other Ambulatory Visit: Payer: Self-pay | Admitting: Internal Medicine

## 2023-02-24 ENCOUNTER — Encounter (HOSPITAL_BASED_OUTPATIENT_CLINIC_OR_DEPARTMENT_OTHER): Payer: Medicare PPO | Admitting: Internal Medicine

## 2023-02-24 DIAGNOSIS — L97322 Non-pressure chronic ulcer of left ankle with fat layer exposed: Secondary | ICD-10-CM | POA: Diagnosis not present

## 2023-02-24 DIAGNOSIS — I87312 Chronic venous hypertension (idiopathic) with ulcer of left lower extremity: Secondary | ICD-10-CM | POA: Diagnosis not present

## 2023-02-24 DIAGNOSIS — I87313 Chronic venous hypertension (idiopathic) with ulcer of bilateral lower extremity: Secondary | ICD-10-CM

## 2023-02-24 DIAGNOSIS — I87311 Chronic venous hypertension (idiopathic) with ulcer of right lower extremity: Secondary | ICD-10-CM

## 2023-02-24 DIAGNOSIS — L97822 Non-pressure chronic ulcer of other part of left lower leg with fat layer exposed: Secondary | ICD-10-CM

## 2023-02-24 DIAGNOSIS — Q819 Epidermolysis bullosa, unspecified: Secondary | ICD-10-CM | POA: Diagnosis not present

## 2023-02-24 DIAGNOSIS — L97812 Non-pressure chronic ulcer of other part of right lower leg with fat layer exposed: Secondary | ICD-10-CM | POA: Diagnosis not present

## 2023-03-02 ENCOUNTER — Encounter (HOSPITAL_BASED_OUTPATIENT_CLINIC_OR_DEPARTMENT_OTHER): Payer: Medicare PPO | Admitting: Internal Medicine

## 2023-03-02 DIAGNOSIS — I87311 Chronic venous hypertension (idiopathic) with ulcer of right lower extremity: Secondary | ICD-10-CM | POA: Diagnosis not present

## 2023-03-02 DIAGNOSIS — Q819 Epidermolysis bullosa, unspecified: Secondary | ICD-10-CM | POA: Diagnosis not present

## 2023-03-02 DIAGNOSIS — L97812 Non-pressure chronic ulcer of other part of right lower leg with fat layer exposed: Secondary | ICD-10-CM

## 2023-03-02 DIAGNOSIS — I87313 Chronic venous hypertension (idiopathic) with ulcer of bilateral lower extremity: Secondary | ICD-10-CM

## 2023-03-02 DIAGNOSIS — L97322 Non-pressure chronic ulcer of left ankle with fat layer exposed: Secondary | ICD-10-CM | POA: Diagnosis not present

## 2023-03-02 DIAGNOSIS — L97822 Non-pressure chronic ulcer of other part of left lower leg with fat layer exposed: Secondary | ICD-10-CM | POA: Diagnosis not present

## 2023-03-02 DIAGNOSIS — I87312 Chronic venous hypertension (idiopathic) with ulcer of left lower extremity: Secondary | ICD-10-CM

## 2023-03-09 ENCOUNTER — Encounter (HOSPITAL_BASED_OUTPATIENT_CLINIC_OR_DEPARTMENT_OTHER): Payer: Medicare PPO | Admitting: Internal Medicine

## 2023-03-09 DIAGNOSIS — I87312 Chronic venous hypertension (idiopathic) with ulcer of left lower extremity: Secondary | ICD-10-CM

## 2023-03-09 DIAGNOSIS — I87313 Chronic venous hypertension (idiopathic) with ulcer of bilateral lower extremity: Secondary | ICD-10-CM | POA: Diagnosis not present

## 2023-03-09 DIAGNOSIS — I87311 Chronic venous hypertension (idiopathic) with ulcer of right lower extremity: Secondary | ICD-10-CM

## 2023-03-09 DIAGNOSIS — L97812 Non-pressure chronic ulcer of other part of right lower leg with fat layer exposed: Secondary | ICD-10-CM | POA: Diagnosis not present

## 2023-03-09 DIAGNOSIS — L97822 Non-pressure chronic ulcer of other part of left lower leg with fat layer exposed: Secondary | ICD-10-CM

## 2023-03-09 DIAGNOSIS — Q819 Epidermolysis bullosa, unspecified: Secondary | ICD-10-CM | POA: Diagnosis not present

## 2023-03-09 DIAGNOSIS — L97322 Non-pressure chronic ulcer of left ankle with fat layer exposed: Secondary | ICD-10-CM | POA: Diagnosis not present

## 2023-03-10 DIAGNOSIS — H43813 Vitreous degeneration, bilateral: Secondary | ICD-10-CM | POA: Diagnosis not present

## 2023-03-10 DIAGNOSIS — H35033 Hypertensive retinopathy, bilateral: Secondary | ICD-10-CM | POA: Diagnosis not present

## 2023-03-10 DIAGNOSIS — Z961 Presence of intraocular lens: Secondary | ICD-10-CM | POA: Diagnosis not present

## 2023-03-10 DIAGNOSIS — H353132 Nonexudative age-related macular degeneration, bilateral, intermediate dry stage: Secondary | ICD-10-CM | POA: Diagnosis not present

## 2023-03-16 ENCOUNTER — Encounter (HOSPITAL_BASED_OUTPATIENT_CLINIC_OR_DEPARTMENT_OTHER): Payer: Medicare PPO | Attending: Internal Medicine | Admitting: Internal Medicine

## 2023-03-16 DIAGNOSIS — I87311 Chronic venous hypertension (idiopathic) with ulcer of right lower extremity: Secondary | ICD-10-CM | POA: Diagnosis not present

## 2023-03-16 DIAGNOSIS — I87332 Chronic venous hypertension (idiopathic) with ulcer and inflammation of left lower extremity: Secondary | ICD-10-CM | POA: Diagnosis not present

## 2023-03-16 DIAGNOSIS — L97822 Non-pressure chronic ulcer of other part of left lower leg with fat layer exposed: Secondary | ICD-10-CM | POA: Insufficient documentation

## 2023-03-16 DIAGNOSIS — L97812 Non-pressure chronic ulcer of other part of right lower leg with fat layer exposed: Secondary | ICD-10-CM | POA: Diagnosis not present

## 2023-03-16 DIAGNOSIS — I87312 Chronic venous hypertension (idiopathic) with ulcer of left lower extremity: Secondary | ICD-10-CM | POA: Diagnosis not present

## 2023-03-17 DIAGNOSIS — L97322 Non-pressure chronic ulcer of left ankle with fat layer exposed: Secondary | ICD-10-CM | POA: Diagnosis not present

## 2023-03-17 DIAGNOSIS — S81809A Unspecified open wound, unspecified lower leg, initial encounter: Secondary | ICD-10-CM | POA: Diagnosis not present

## 2023-03-17 DIAGNOSIS — S91301A Unspecified open wound, right foot, initial encounter: Secondary | ICD-10-CM | POA: Diagnosis not present

## 2023-03-17 DIAGNOSIS — L97812 Non-pressure chronic ulcer of other part of right lower leg with fat layer exposed: Secondary | ICD-10-CM | POA: Diagnosis not present

## 2023-03-17 DIAGNOSIS — L97822 Non-pressure chronic ulcer of other part of left lower leg with fat layer exposed: Secondary | ICD-10-CM | POA: Diagnosis not present

## 2023-03-17 DIAGNOSIS — I872 Venous insufficiency (chronic) (peripheral): Secondary | ICD-10-CM | POA: Diagnosis not present

## 2023-03-20 ENCOUNTER — Other Ambulatory Visit: Payer: Self-pay | Admitting: Internal Medicine

## 2023-03-23 ENCOUNTER — Encounter (HOSPITAL_BASED_OUTPATIENT_CLINIC_OR_DEPARTMENT_OTHER): Payer: Medicare PPO | Admitting: Internal Medicine

## 2023-03-23 DIAGNOSIS — L97822 Non-pressure chronic ulcer of other part of left lower leg with fat layer exposed: Secondary | ICD-10-CM

## 2023-03-23 DIAGNOSIS — I87332 Chronic venous hypertension (idiopathic) with ulcer and inflammation of left lower extremity: Secondary | ICD-10-CM | POA: Diagnosis not present

## 2023-03-23 DIAGNOSIS — L97812 Non-pressure chronic ulcer of other part of right lower leg with fat layer exposed: Secondary | ICD-10-CM | POA: Diagnosis not present

## 2023-03-30 ENCOUNTER — Encounter (HOSPITAL_BASED_OUTPATIENT_CLINIC_OR_DEPARTMENT_OTHER): Admitting: Internal Medicine

## 2023-03-30 DIAGNOSIS — I87312 Chronic venous hypertension (idiopathic) with ulcer of left lower extremity: Secondary | ICD-10-CM

## 2023-03-30 DIAGNOSIS — L97812 Non-pressure chronic ulcer of other part of right lower leg with fat layer exposed: Secondary | ICD-10-CM

## 2023-03-30 DIAGNOSIS — I87311 Chronic venous hypertension (idiopathic) with ulcer of right lower extremity: Secondary | ICD-10-CM

## 2023-03-30 DIAGNOSIS — L97822 Non-pressure chronic ulcer of other part of left lower leg with fat layer exposed: Secondary | ICD-10-CM

## 2023-03-30 DIAGNOSIS — I87332 Chronic venous hypertension (idiopathic) with ulcer and inflammation of left lower extremity: Secondary | ICD-10-CM | POA: Diagnosis not present

## 2023-04-01 NOTE — Progress Notes (Signed)
 Patient Care Team: Carlos Evener, MD as PCP - General (Internal Medicine)  Visit Date: 04/16/23  Subjective:   Chief Complaint  Patient presents with   Pain Management    Bp has been high lately but no other concerns    Vitals:   04/16/23 0924 04/16/23 0926  BP: (!) 160/80 (!) 160/80  Patient ZO:XWRUEA H Licea Jr.,Male DOB:June 20, 1940,82 y.o. VWU:[id number]   83 y.o. Male presents today for 3 months follow-up for Musculoskeletal Pain. Patient has a past medical history of Epidermolysis Bullosa; Adenomatous Colon Polyp; Benign Prostatic Hyperplasia; Hypertension; Neutropenia; Alcoholism in Remission; Musculoskeletal Pain; Pelvic Fracture; Elevated Coronary Artery Calcium Score; and Ascending Aorta Dilatation.  He has chronic musculoskeletal pain and is seen here regularly. He takes Tramadol regularly for chronic musculoskeletal pain. He takes medication responsibly and correctly.  History of Chronic Low Back Pain; History of Closed Fracture of Left Acetabulum in 2022. Musculoskeletal pain is treated with Tramadol 50 mg every 12 hours as needed or Extra Strength Tylenol. He says that he have a trigger finger on his left hand that he plans to see his orthopedist about.  History of Epidermolysis Bullosa followed at [facility] regularly, last seen 04/13/2023. Managed with leg wrappings. Says that he currently has one lesion on his left ankle being followed by the Wound Clinic, but none on his right leg today.  History of Hypertension treated with Losartan 100 mg daily. Blood Pressure: hypertensive today at 160/80 initially and on recheck. He says that his systolic blood pressure has been elevated recently circa 160. Denies dizziness/lightheadedness or other cardiac symptoms except for some mild edema.  History of alcoholism, in remission, going to [employer] Has been active in the AA community for years.  History of bereavement after losing his wife, Carlos White, last year.  Doing well  residing alone.  History of Benign Prostatic Hyperplasia treated with Flomax. PSA 0.70 on 10/13/2022. He has stopped taking this to prevent fluid retention and hasn't had any difficulty with urination according to him.   He says that he is seeing Dr. Denman Fischer regarding some lesions on his head, who he last saw on 01/23/2023. Past Medical History:  Diagnosis Date   Adenomatous polyp    Atypical chest pain    Broken hip (HCC)    Epidermolysis bullosa    History of alcoholism (HCC)    Hypertension    Splenomegaly 08/18/2013  No Known Allergies  Family History  Problem Relation Age of Onset   Hypertension Mother    Diabetes Mother    Heart disease Father    Social Hx: Lost his wife, Carlos White, last year. He has been volunteering at [employer]. Going to AA, hasn't spoken at those meetings yet. Said previously he plans on staying in his home for right now taking care of his dog, but has thought about making arrangements to may be move to [facility].  Review of Systems  Cardiovascular:  Positive for leg swelling. Negative for chest pain and palpitations.  Musculoskeletal:        (+) Trigger Finger     Objective:  Vitals: BP (!) 160/80 (BP Location: Left Arm, Patient Position: Sitting, Cuff Size: Normal)   Pulse 73   Ht 5\' 10"  (1.778 m)   Wt 195 lb (88.5 kg)   SpO2 99%   BMI 27.98 kg/m   Physical Exam Vitals and nursing note reviewed.  Constitutional:      General: He is not in acute distress.  Appearance: Normal appearance. He is not ill-appearing.  HENT:     Head: Normocephalic and atraumatic.  Cardiovascular:     Rate and Rhythm: Normal rate and regular rhythm.     Pulses: Normal pulses.     Heart sounds: Normal heart sounds. No murmur heard.    No friction rub. No gallop.  Pulmonary:     Effort: Pulmonary effort is normal. No respiratory distress.     Breath sounds: Normal breath sounds. No wheezing or rales.  Skin:    General: Skin is warm and dry.     Findings:  Lesion (scalp) present.  Neurological:     Mental Status: He is alert and oriented to person, place, and time. Mental status is at baseline.  Psychiatric:        Mood and Affect: Mood normal.        Behavior: Behavior normal.        Thought Content: Thought content normal.        Judgment: Judgment normal.     Results:  Studies Obtained And Personally Reviewed By Me: Labs:     Component Value Date/Time   NA 139 10/13/2022 1025   NA 140 08/18/2013 1121   K 4.3 10/13/2022 1025   K 4.3 08/18/2013 1121   CL 105 10/13/2022 1025   CO2 26 10/13/2022 1025   CO2 25 08/18/2013 1121   GLUCOSE 100 (H) 10/13/2022 1025   GLUCOSE 92 08/18/2013 1121   BUN 13 10/13/2022 1025   BUN 10.0 08/18/2013 1121   CREATININE 0.87 10/13/2022 1025   CREATININE 0.8 08/18/2013 1121   CALCIUM 9.2 10/13/2022 1025   CALCIUM 9.4 08/18/2013 1121   PROT 6.8 10/13/2022 1025   PROT 6.4 08/18/2013 1121   ALBUMIN 3.4 (L) 05/09/2020 0307   ALBUMIN 3.7 08/18/2013 1121   AST 12 10/13/2022 1025   AST 15 08/18/2013 1121   ALT 8 (L) 10/13/2022 1025   ALT 11 08/18/2013 1121   ALKPHOS 56 05/09/2020 0307   ALKPHOS 55 08/18/2013 1121   BILITOT 0.5 10/13/2022 1025   BILITOT 0.65 08/18/2013 1121   GFRNONAA >60 05/10/2020 0308   GFRNONAA 76 05/08/2020 0922   GFRAA 88 05/08/2020 0922    Lab Results  Component Value Date   WBC 4.6 10/13/2022   HGB 12.1 (L) 10/13/2022   HCT 36.4 (L) 10/13/2022   MCV 98.1 10/13/2022   PLT 212 10/13/2022   Lab Results  Component Value Date   CHOL 132 10/13/2022   HDL 59 10/13/2022   LDLCALC 60 10/13/2022   TRIG 46 10/13/2022   CHOLHDL 2.2 10/13/2022   Lab Results  Component Value Date   HGBA1C 5.3 04/19/2013    Lab Results  Component Value Date   PSA 0.70 10/13/2022   PSA 0.75 10/03/2021   PSA 0.77 05/08/2020     Results for orders placed or performed in visit on 01/20/23  DRUG MONITORING, PANEL 8 WITH CONFIRMATION, URINE   Collection Time: 01/20/23 10:13 AM  Result  Value Ref Range   Alcohol Metabolites NEGATIVE <500 ng/mL   Amphetamines NEGATIVE <500 ng/mL   Benzodiazepines NEGATIVE <100 ng/mL   Buprenorphine, Urine NEGATIVE <5 ng/mL   Cocaine Metabolite NEGATIVE <150 ng/mL   6 Acetylmorphine NEGATIVE <10 ng/mL   Marijuana Metabolite NEGATIVE <20 ng/mL   MDMA NEGATIVE <500 ng/mL   Opiates NEGATIVE <100 ng/mL   Oxycodone NEGATIVE <100 ng/mL   Creatinine 111.3 > or = 20.0 mg/dL   pH 6.0 4.5 - 9.0  Oxidant NEGATIVE <200 mcg/mL  DM TEMPLATE   Collection Time: 01/20/23 10:13 AM  Result Value Ref Range   Notes and Comments     Assessment & Plan:  Chronic Low Back Pain; History of Closed Fracture, Left Acetabulum in 2022. Musculoskeletal pain treated with Tramadol 50 mg every 12 hours as needed or Extra Strength Tylenol. He takes his medication responsibly and as instructed. Drug screening 01/20/2023 was negative, Generally we test him q 6 months. Will have urine frun screen in 3 months.  Trigger Finger on his left hand he plans to see his orthopedist about.  Epidermolysis Bullosa followed at [facility] regularly, last seen 04/13/2023. Managed with leg wrappings. Says that he currently has one lesion on his left ankle being followed by the [facility], but none on his right leg today.  Hypertension; Dependent Edema treated with Losartan 100 mg daily. Blood Pressure: hypertensive today at 160/80 initially and on recheck. He says that his systolic blood pressure has been elevated recently sometimes at 160. Denies dizziness/lightheadedness or other cardiac symptoms except for mild edema. Adding Hydrochlorothiazide 25 mg daily to help with BP control and edema. Follow up in 3 months with Basic metabolic panel.  History of Alcoholism, in remission, going to [employer]   Bereavement after losing his wife, Carlos White, last year.    Benign Prostatic Hyperplasia treated with Flomax. PSA 0.70 on 10/13/2022. He has stopped taking this to prevent  fluid retention and hasn't had any difficulty with urination according to him.   Seeing Dr. Denman Fischer regarding some Lesions, Scalp, who he last saw on 01/23/2023.    I,Emily Lagle,acting as a Neurosurgeon for Carlos Evener, MD.,have documented all relevant documentation on the behalf of Carlos Evener, MD,as directed by  Carlos Evener, MD while in the presence of Carlos Evener, MD.   I, Carlos Evener, MD, have reviewed all documentation for this visit. The documentation on 04/26/23 for the exam, diagnosis, procedures, and orders are all accurate and complete.

## 2023-04-02 DIAGNOSIS — H04123 Dry eye syndrome of bilateral lacrimal glands: Secondary | ICD-10-CM | POA: Diagnosis not present

## 2023-04-02 DIAGNOSIS — H524 Presbyopia: Secondary | ICD-10-CM | POA: Diagnosis not present

## 2023-04-02 DIAGNOSIS — H35363 Drusen (degenerative) of macula, bilateral: Secondary | ICD-10-CM | POA: Diagnosis not present

## 2023-04-02 DIAGNOSIS — H353132 Nonexudative age-related macular degeneration, bilateral, intermediate dry stage: Secondary | ICD-10-CM | POA: Diagnosis not present

## 2023-04-06 ENCOUNTER — Encounter (HOSPITAL_BASED_OUTPATIENT_CLINIC_OR_DEPARTMENT_OTHER): Admitting: Internal Medicine

## 2023-04-06 DIAGNOSIS — I87313 Chronic venous hypertension (idiopathic) with ulcer of bilateral lower extremity: Secondary | ICD-10-CM

## 2023-04-06 DIAGNOSIS — L97822 Non-pressure chronic ulcer of other part of left lower leg with fat layer exposed: Secondary | ICD-10-CM

## 2023-04-06 DIAGNOSIS — I87311 Chronic venous hypertension (idiopathic) with ulcer of right lower extremity: Secondary | ICD-10-CM

## 2023-04-06 DIAGNOSIS — I87332 Chronic venous hypertension (idiopathic) with ulcer and inflammation of left lower extremity: Secondary | ICD-10-CM | POA: Diagnosis not present

## 2023-04-06 DIAGNOSIS — L97812 Non-pressure chronic ulcer of other part of right lower leg with fat layer exposed: Secondary | ICD-10-CM | POA: Diagnosis not present

## 2023-04-06 DIAGNOSIS — I87312 Chronic venous hypertension (idiopathic) with ulcer of left lower extremity: Secondary | ICD-10-CM

## 2023-04-13 ENCOUNTER — Encounter (HOSPITAL_BASED_OUTPATIENT_CLINIC_OR_DEPARTMENT_OTHER): Admitting: Internal Medicine

## 2023-04-13 DIAGNOSIS — I87332 Chronic venous hypertension (idiopathic) with ulcer and inflammation of left lower extremity: Secondary | ICD-10-CM | POA: Diagnosis not present

## 2023-04-13 DIAGNOSIS — L97822 Non-pressure chronic ulcer of other part of left lower leg with fat layer exposed: Secondary | ICD-10-CM | POA: Diagnosis not present

## 2023-04-13 DIAGNOSIS — I87312 Chronic venous hypertension (idiopathic) with ulcer of left lower extremity: Secondary | ICD-10-CM | POA: Diagnosis not present

## 2023-04-13 DIAGNOSIS — I87311 Chronic venous hypertension (idiopathic) with ulcer of right lower extremity: Secondary | ICD-10-CM

## 2023-04-13 DIAGNOSIS — L97812 Non-pressure chronic ulcer of other part of right lower leg with fat layer exposed: Secondary | ICD-10-CM | POA: Diagnosis not present

## 2023-04-14 ENCOUNTER — Other Ambulatory Visit: Payer: Medicare PPO

## 2023-04-16 ENCOUNTER — Encounter: Payer: Self-pay | Admitting: Internal Medicine

## 2023-04-16 ENCOUNTER — Ambulatory Visit: Payer: Medicare PPO | Admitting: Internal Medicine

## 2023-04-16 VITALS — BP 160/80 | HR 73 | Ht 70.0 in | Wt 195.0 lb

## 2023-04-16 DIAGNOSIS — Q819 Epidermolysis bullosa, unspecified: Secondary | ICD-10-CM

## 2023-04-16 DIAGNOSIS — I10 Essential (primary) hypertension: Secondary | ICD-10-CM | POA: Diagnosis not present

## 2023-04-16 DIAGNOSIS — R609 Edema, unspecified: Secondary | ICD-10-CM

## 2023-04-16 DIAGNOSIS — G8921 Chronic pain due to trauma: Secondary | ICD-10-CM

## 2023-04-16 MED ORDER — HYDROCHLOROTHIAZIDE 25 MG PO TABS: 25.0000 mg | ORAL_TABLET | Freq: Every day | ORAL | 3 refills | Status: AC

## 2023-04-20 ENCOUNTER — Encounter (HOSPITAL_BASED_OUTPATIENT_CLINIC_OR_DEPARTMENT_OTHER): Attending: Internal Medicine | Admitting: Internal Medicine

## 2023-04-20 DIAGNOSIS — L97812 Non-pressure chronic ulcer of other part of right lower leg with fat layer exposed: Secondary | ICD-10-CM | POA: Insufficient documentation

## 2023-04-20 DIAGNOSIS — B372 Candidiasis of skin and nail: Secondary | ICD-10-CM | POA: Diagnosis not present

## 2023-04-20 DIAGNOSIS — I87312 Chronic venous hypertension (idiopathic) with ulcer of left lower extremity: Secondary | ICD-10-CM | POA: Insufficient documentation

## 2023-04-20 DIAGNOSIS — I87311 Chronic venous hypertension (idiopathic) with ulcer of right lower extremity: Secondary | ICD-10-CM | POA: Diagnosis not present

## 2023-04-20 DIAGNOSIS — L97822 Non-pressure chronic ulcer of other part of left lower leg with fat layer exposed: Secondary | ICD-10-CM | POA: Diagnosis not present

## 2023-04-20 DIAGNOSIS — Q818 Other epidermolysis bullosa: Secondary | ICD-10-CM | POA: Diagnosis not present

## 2023-04-22 ENCOUNTER — Other Ambulatory Visit: Payer: Self-pay | Admitting: Internal Medicine

## 2023-04-26 NOTE — Patient Instructions (Signed)
 Patient to have urine drug screen with next OV as well as B-met since we are starting low dose hydrochlorothiazide 25 mg daily for dependent edema.Continue Tramadol as directed for chronic pain.

## 2023-04-27 ENCOUNTER — Encounter (HOSPITAL_BASED_OUTPATIENT_CLINIC_OR_DEPARTMENT_OTHER): Admitting: Internal Medicine

## 2023-04-27 DIAGNOSIS — B372 Candidiasis of skin and nail: Secondary | ICD-10-CM | POA: Diagnosis not present

## 2023-04-27 DIAGNOSIS — Q818 Other epidermolysis bullosa: Secondary | ICD-10-CM | POA: Diagnosis not present

## 2023-04-27 DIAGNOSIS — I87312 Chronic venous hypertension (idiopathic) with ulcer of left lower extremity: Secondary | ICD-10-CM | POA: Diagnosis not present

## 2023-04-27 DIAGNOSIS — L97812 Non-pressure chronic ulcer of other part of right lower leg with fat layer exposed: Secondary | ICD-10-CM | POA: Diagnosis not present

## 2023-04-27 DIAGNOSIS — L97822 Non-pressure chronic ulcer of other part of left lower leg with fat layer exposed: Secondary | ICD-10-CM | POA: Diagnosis not present

## 2023-04-27 DIAGNOSIS — I87311 Chronic venous hypertension (idiopathic) with ulcer of right lower extremity: Secondary | ICD-10-CM | POA: Diagnosis not present

## 2023-05-04 ENCOUNTER — Encounter (HOSPITAL_BASED_OUTPATIENT_CLINIC_OR_DEPARTMENT_OTHER): Admitting: Internal Medicine

## 2023-05-04 DIAGNOSIS — I87312 Chronic venous hypertension (idiopathic) with ulcer of left lower extremity: Secondary | ICD-10-CM

## 2023-05-04 DIAGNOSIS — I87311 Chronic venous hypertension (idiopathic) with ulcer of right lower extremity: Secondary | ICD-10-CM | POA: Diagnosis not present

## 2023-05-04 DIAGNOSIS — B372 Candidiasis of skin and nail: Secondary | ICD-10-CM | POA: Diagnosis not present

## 2023-05-04 DIAGNOSIS — Q818 Other epidermolysis bullosa: Secondary | ICD-10-CM | POA: Diagnosis not present

## 2023-05-04 DIAGNOSIS — L97812 Non-pressure chronic ulcer of other part of right lower leg with fat layer exposed: Secondary | ICD-10-CM

## 2023-05-04 DIAGNOSIS — L97822 Non-pressure chronic ulcer of other part of left lower leg with fat layer exposed: Secondary | ICD-10-CM | POA: Diagnosis not present

## 2023-05-11 ENCOUNTER — Encounter (HOSPITAL_BASED_OUTPATIENT_CLINIC_OR_DEPARTMENT_OTHER): Admitting: Internal Medicine

## 2023-05-11 DIAGNOSIS — B372 Candidiasis of skin and nail: Secondary | ICD-10-CM | POA: Diagnosis not present

## 2023-05-11 DIAGNOSIS — I87312 Chronic venous hypertension (idiopathic) with ulcer of left lower extremity: Secondary | ICD-10-CM | POA: Diagnosis not present

## 2023-05-11 DIAGNOSIS — L97812 Non-pressure chronic ulcer of other part of right lower leg with fat layer exposed: Secondary | ICD-10-CM | POA: Diagnosis not present

## 2023-05-11 DIAGNOSIS — Q818 Other epidermolysis bullosa: Secondary | ICD-10-CM | POA: Diagnosis not present

## 2023-05-11 DIAGNOSIS — I87311 Chronic venous hypertension (idiopathic) with ulcer of right lower extremity: Secondary | ICD-10-CM | POA: Diagnosis not present

## 2023-05-11 DIAGNOSIS — L97822 Non-pressure chronic ulcer of other part of left lower leg with fat layer exposed: Secondary | ICD-10-CM | POA: Diagnosis not present

## 2023-05-13 ENCOUNTER — Encounter (HOSPITAL_BASED_OUTPATIENT_CLINIC_OR_DEPARTMENT_OTHER): Admitting: Internal Medicine

## 2023-05-18 ENCOUNTER — Encounter (HOSPITAL_BASED_OUTPATIENT_CLINIC_OR_DEPARTMENT_OTHER): Attending: Internal Medicine | Admitting: Internal Medicine

## 2023-05-18 DIAGNOSIS — L97812 Non-pressure chronic ulcer of other part of right lower leg with fat layer exposed: Secondary | ICD-10-CM | POA: Insufficient documentation

## 2023-05-18 DIAGNOSIS — L97822 Non-pressure chronic ulcer of other part of left lower leg with fat layer exposed: Secondary | ICD-10-CM | POA: Insufficient documentation

## 2023-05-18 DIAGNOSIS — I87311 Chronic venous hypertension (idiopathic) with ulcer of right lower extremity: Secondary | ICD-10-CM | POA: Insufficient documentation

## 2023-05-18 DIAGNOSIS — Q818 Other epidermolysis bullosa: Secondary | ICD-10-CM | POA: Diagnosis not present

## 2023-05-18 DIAGNOSIS — B372 Candidiasis of skin and nail: Secondary | ICD-10-CM | POA: Diagnosis not present

## 2023-05-18 DIAGNOSIS — I87312 Chronic venous hypertension (idiopathic) with ulcer of left lower extremity: Secondary | ICD-10-CM | POA: Insufficient documentation

## 2023-05-21 DIAGNOSIS — S91301A Unspecified open wound, right foot, initial encounter: Secondary | ICD-10-CM | POA: Diagnosis not present

## 2023-05-21 DIAGNOSIS — I872 Venous insufficiency (chronic) (peripheral): Secondary | ICD-10-CM | POA: Diagnosis not present

## 2023-05-21 DIAGNOSIS — L97322 Non-pressure chronic ulcer of left ankle with fat layer exposed: Secondary | ICD-10-CM | POA: Diagnosis not present

## 2023-05-21 DIAGNOSIS — S81809A Unspecified open wound, unspecified lower leg, initial encounter: Secondary | ICD-10-CM | POA: Diagnosis not present

## 2023-05-21 DIAGNOSIS — L97812 Non-pressure chronic ulcer of other part of right lower leg with fat layer exposed: Secondary | ICD-10-CM | POA: Diagnosis not present

## 2023-05-21 DIAGNOSIS — L97822 Non-pressure chronic ulcer of other part of left lower leg with fat layer exposed: Secondary | ICD-10-CM | POA: Diagnosis not present

## 2023-05-26 ENCOUNTER — Ambulatory Visit (HOSPITAL_BASED_OUTPATIENT_CLINIC_OR_DEPARTMENT_OTHER): Admitting: Internal Medicine

## 2023-05-26 ENCOUNTER — Other Ambulatory Visit: Payer: Self-pay | Admitting: Internal Medicine

## 2023-05-27 ENCOUNTER — Other Ambulatory Visit: Payer: Self-pay | Admitting: Internal Medicine

## 2023-05-27 NOTE — Telephone Encounter (Signed)
 Copied from CRM [id number]. Topic: Clinical - Medication Refill >> May 27, 2023 10:19 AM Cynthia K wrote: Medication: traMADol  (ULTRAM ) 50 MG tablet  Has the patient contacted their pharmacy? Yes (Agent: If no, request that the patient contact the pharmacy for the refill. If patient does not wish to contact the pharmacy document the reason why and proceed with request.) (Agent: If yes, when and what did the pharmacy advise?) Pharmacy needs order to refill  This is the patient's preferred pharmacy:  [id number] - [address] [place] [address] Phone: [phone] Fax: [phone]  Is this the correct pharmacy for this prescription? Yes If no, delete pharmacy and type the correct one.   Has the prescription been filled recently? No  Is the patient out of the medication? Yes - He ran out today of this medication   Has the patient been seen for an appointment in the last year OR does the patient have an upcoming appointment? Yes  Can we respond through MyChart? No  Agent: Please be advised that Rx refills may take up to 3 business days. We ask that you follow-up with your pharmacy.

## 2023-05-27 NOTE — Telephone Encounter (Signed)
 Spoke to patient regarding Tramadol  50mg . Medication already filled and sent to preferred pharmacy. Patient verbalized understanding.

## 2023-06-01 ENCOUNTER — Encounter (HOSPITAL_BASED_OUTPATIENT_CLINIC_OR_DEPARTMENT_OTHER): Admitting: Internal Medicine

## 2023-06-01 DIAGNOSIS — Q818 Other epidermolysis bullosa: Secondary | ICD-10-CM | POA: Diagnosis not present

## 2023-06-01 DIAGNOSIS — I87312 Chronic venous hypertension (idiopathic) with ulcer of left lower extremity: Secondary | ICD-10-CM

## 2023-06-01 DIAGNOSIS — B372 Candidiasis of skin and nail: Secondary | ICD-10-CM | POA: Diagnosis not present

## 2023-06-01 DIAGNOSIS — L97822 Non-pressure chronic ulcer of other part of left lower leg with fat layer exposed: Secondary | ICD-10-CM | POA: Diagnosis not present

## 2023-06-01 DIAGNOSIS — L97322 Non-pressure chronic ulcer of left ankle with fat layer exposed: Secondary | ICD-10-CM | POA: Diagnosis not present

## 2023-06-01 DIAGNOSIS — L97812 Non-pressure chronic ulcer of other part of right lower leg with fat layer exposed: Secondary | ICD-10-CM | POA: Diagnosis not present

## 2023-06-01 DIAGNOSIS — I87311 Chronic venous hypertension (idiopathic) with ulcer of right lower extremity: Secondary | ICD-10-CM

## 2023-06-01 DIAGNOSIS — S91301A Unspecified open wound, right foot, initial encounter: Secondary | ICD-10-CM | POA: Diagnosis not present

## 2023-06-01 DIAGNOSIS — S81809A Unspecified open wound, unspecified lower leg, initial encounter: Secondary | ICD-10-CM | POA: Diagnosis not present

## 2023-06-01 DIAGNOSIS — I872 Venous insufficiency (chronic) (peripheral): Secondary | ICD-10-CM | POA: Diagnosis not present

## 2023-06-03 DIAGNOSIS — L97322 Non-pressure chronic ulcer of left ankle with fat layer exposed: Secondary | ICD-10-CM | POA: Diagnosis not present

## 2023-06-03 DIAGNOSIS — L97812 Non-pressure chronic ulcer of other part of right lower leg with fat layer exposed: Secondary | ICD-10-CM | POA: Diagnosis not present

## 2023-06-03 DIAGNOSIS — S91301A Unspecified open wound, right foot, initial encounter: Secondary | ICD-10-CM | POA: Diagnosis not present

## 2023-06-03 DIAGNOSIS — I87312 Chronic venous hypertension (idiopathic) with ulcer of left lower extremity: Secondary | ICD-10-CM | POA: Diagnosis not present

## 2023-06-03 DIAGNOSIS — I87311 Chronic venous hypertension (idiopathic) with ulcer of right lower extremity: Secondary | ICD-10-CM | POA: Diagnosis not present

## 2023-06-03 DIAGNOSIS — I872 Venous insufficiency (chronic) (peripheral): Secondary | ICD-10-CM | POA: Diagnosis not present

## 2023-06-03 DIAGNOSIS — L97822 Non-pressure chronic ulcer of other part of left lower leg with fat layer exposed: Secondary | ICD-10-CM | POA: Diagnosis not present

## 2023-06-03 DIAGNOSIS — S81809A Unspecified open wound, unspecified lower leg, initial encounter: Secondary | ICD-10-CM | POA: Diagnosis not present

## 2023-06-05 DIAGNOSIS — S81809A Unspecified open wound, unspecified lower leg, initial encounter: Secondary | ICD-10-CM | POA: Diagnosis not present

## 2023-06-05 DIAGNOSIS — L97812 Non-pressure chronic ulcer of other part of right lower leg with fat layer exposed: Secondary | ICD-10-CM | POA: Diagnosis not present

## 2023-06-05 DIAGNOSIS — I87311 Chronic venous hypertension (idiopathic) with ulcer of right lower extremity: Secondary | ICD-10-CM | POA: Diagnosis not present

## 2023-06-05 DIAGNOSIS — L97322 Non-pressure chronic ulcer of left ankle with fat layer exposed: Secondary | ICD-10-CM | POA: Diagnosis not present

## 2023-06-05 DIAGNOSIS — I87312 Chronic venous hypertension (idiopathic) with ulcer of left lower extremity: Secondary | ICD-10-CM | POA: Diagnosis not present

## 2023-06-05 DIAGNOSIS — S91301A Unspecified open wound, right foot, initial encounter: Secondary | ICD-10-CM | POA: Diagnosis not present

## 2023-06-05 DIAGNOSIS — L97822 Non-pressure chronic ulcer of other part of left lower leg with fat layer exposed: Secondary | ICD-10-CM | POA: Diagnosis not present

## 2023-06-05 DIAGNOSIS — I872 Venous insufficiency (chronic) (peripheral): Secondary | ICD-10-CM | POA: Diagnosis not present

## 2023-06-11 ENCOUNTER — Encounter (HOSPITAL_BASED_OUTPATIENT_CLINIC_OR_DEPARTMENT_OTHER): Admitting: Internal Medicine

## 2023-06-11 DIAGNOSIS — I87311 Chronic venous hypertension (idiopathic) with ulcer of right lower extremity: Secondary | ICD-10-CM

## 2023-06-11 DIAGNOSIS — L97812 Non-pressure chronic ulcer of other part of right lower leg with fat layer exposed: Secondary | ICD-10-CM | POA: Diagnosis not present

## 2023-06-11 DIAGNOSIS — B372 Candidiasis of skin and nail: Secondary | ICD-10-CM | POA: Diagnosis not present

## 2023-06-11 DIAGNOSIS — L97822 Non-pressure chronic ulcer of other part of left lower leg with fat layer exposed: Secondary | ICD-10-CM

## 2023-06-11 DIAGNOSIS — I87312 Chronic venous hypertension (idiopathic) with ulcer of left lower extremity: Secondary | ICD-10-CM | POA: Diagnosis not present

## 2023-06-11 DIAGNOSIS — Q818 Other epidermolysis bullosa: Secondary | ICD-10-CM | POA: Diagnosis not present

## 2023-06-22 ENCOUNTER — Ambulatory Visit (HOSPITAL_BASED_OUTPATIENT_CLINIC_OR_DEPARTMENT_OTHER): Admitting: Internal Medicine

## 2023-06-24 ENCOUNTER — Other Ambulatory Visit: Payer: Self-pay | Admitting: Internal Medicine

## 2023-06-26 ENCOUNTER — Other Ambulatory Visit: Payer: Self-pay | Admitting: Internal Medicine

## 2023-06-26 DIAGNOSIS — G8921 Chronic pain due to trauma: Secondary | ICD-10-CM

## 2023-06-26 NOTE — Telephone Encounter (Unsigned)
 Copied from CRM [id number]. Topic: Clinical - Medication Refill >> Jun 26, 2023  4:37 PM Felizardo Hotter wrote: Medication: traMADol  (ULTRAM ) 50 MG tablet  Has the patient contacted their pharmacy? Yes (Agent: If no, request that the patient contact the pharmacy for the refill. If patient does not wish to contact the pharmacy document the reason why and proceed with request.) (Agent: If yes, when and what did the pharmacy advise?)  This is the patient's preferred pharmacy:  [id number] - [address] [place] [address] Phone: [phone] Fax: [phone]  Is this the correct pharmacy for this prescription? Yes If no, delete pharmacy and type the correct one.   Has the prescription been filled recently? Yes  Is the patient out of the medication? Yes  Has the patient been seen for an appointment in the last year OR does the patient have an upcoming appointment? Yes  Can we respond through MyChart? Yes  Agent: Please be advised that Rx refills may take up to 3 business days. We ask that you follow-up with your pharmacy.

## 2023-06-26 NOTE — Telephone Encounter (Unsigned)
 Copied from CRM [id number]. Topic: Clinical - Medication Refill >> Jun 26, 2023 10:06 AM Carlos White wrote: Medication: traMADol  (ULTRAM ) 50 MG tablet  Has the patient contacted their pharmacy? Yes (Agent: If no, request that the patient contact the pharmacy for the refill. If patient does not wish to contact the pharmacy document the reason why and proceed with request.) (Agent: If yes, when and what did the pharmacy advise?)  This is the patient's preferred pharmacy:  [id number] - [address] [place] [address] Phone: [phone] Fax: [phone]  Is this the correct pharmacy for this prescription? Yes If no, delete pharmacy and type the correct one.   Has the prescription been filled recently? No  Is the patient out of the medication? Yes  Has the patient been seen for an appointment in the last year OR does the patient have an upcoming appointment? Yes  Can we respond through MyChart? Yes  Agent: Please be advised that Rx refills may take up to 3 business days. We ask that you follow-up with your pharmacy.

## 2023-06-29 ENCOUNTER — Encounter (HOSPITAL_BASED_OUTPATIENT_CLINIC_OR_DEPARTMENT_OTHER): Attending: Internal Medicine | Admitting: Internal Medicine

## 2023-06-29 DIAGNOSIS — I87312 Chronic venous hypertension (idiopathic) with ulcer of left lower extremity: Secondary | ICD-10-CM | POA: Insufficient documentation

## 2023-06-29 DIAGNOSIS — I87311 Chronic venous hypertension (idiopathic) with ulcer of right lower extremity: Secondary | ICD-10-CM | POA: Diagnosis not present

## 2023-06-29 DIAGNOSIS — B372 Candidiasis of skin and nail: Secondary | ICD-10-CM | POA: Diagnosis not present

## 2023-06-29 DIAGNOSIS — L97822 Non-pressure chronic ulcer of other part of left lower leg with fat layer exposed: Secondary | ICD-10-CM | POA: Diagnosis not present

## 2023-06-29 DIAGNOSIS — L97812 Non-pressure chronic ulcer of other part of right lower leg with fat layer exposed: Secondary | ICD-10-CM | POA: Diagnosis not present

## 2023-06-29 DIAGNOSIS — Q818 Other epidermolysis bullosa: Secondary | ICD-10-CM | POA: Insufficient documentation

## 2023-06-29 MED ORDER — TRAMADOL HCL 50 MG PO TABS: 50.0000 mg | ORAL_TABLET | Freq: Two times a day (BID) | ORAL | 0 refills | Status: DC | PRN

## 2023-06-29 NOTE — Telephone Encounter (Signed)
 Patient says he did notget Tramadol  refill on Friday as requested. He had contacted [facility] call center. Does not look like we sent this in and I do not recall seeing this message. Have sent in Rx today.  MJB, MD

## 2023-07-13 ENCOUNTER — Encounter (HOSPITAL_BASED_OUTPATIENT_CLINIC_OR_DEPARTMENT_OTHER): Admitting: Internal Medicine

## 2023-07-13 DIAGNOSIS — I87311 Chronic venous hypertension (idiopathic) with ulcer of right lower extremity: Secondary | ICD-10-CM

## 2023-07-13 DIAGNOSIS — I87312 Chronic venous hypertension (idiopathic) with ulcer of left lower extremity: Secondary | ICD-10-CM

## 2023-07-13 DIAGNOSIS — L97822 Non-pressure chronic ulcer of other part of left lower leg with fat layer exposed: Secondary | ICD-10-CM

## 2023-07-13 DIAGNOSIS — B372 Candidiasis of skin and nail: Secondary | ICD-10-CM | POA: Diagnosis not present

## 2023-07-13 DIAGNOSIS — L97812 Non-pressure chronic ulcer of other part of right lower leg with fat layer exposed: Secondary | ICD-10-CM | POA: Diagnosis not present

## 2023-07-13 DIAGNOSIS — Q818 Other epidermolysis bullosa: Secondary | ICD-10-CM

## 2023-07-13 DIAGNOSIS — I87313 Chronic venous hypertension (idiopathic) with ulcer of bilateral lower extremity: Secondary | ICD-10-CM | POA: Diagnosis not present

## 2023-07-14 DIAGNOSIS — H35033 Hypertensive retinopathy, bilateral: Secondary | ICD-10-CM | POA: Diagnosis not present

## 2023-07-14 DIAGNOSIS — H353132 Nonexudative age-related macular degeneration, bilateral, intermediate dry stage: Secondary | ICD-10-CM | POA: Diagnosis not present

## 2023-07-14 DIAGNOSIS — Z961 Presence of intraocular lens: Secondary | ICD-10-CM | POA: Diagnosis not present

## 2023-07-14 DIAGNOSIS — H43813 Vitreous degeneration, bilateral: Secondary | ICD-10-CM | POA: Diagnosis not present

## 2023-07-21 ENCOUNTER — Ambulatory Visit: Admitting: Internal Medicine

## 2023-07-23 NOTE — Progress Notes (Signed)
 Patient Care Team: Perri Carlos PARAS, MD as PCP - General (Internal Medicine)  Visit Date: 07/24/23  Subjective:   Chief Complaint  Patient presents with   Pain Management   Patient PI:Carlos H Goldberger Jr.,Male DOB:1940-01-24,82 y.o. FMW:[id number]   83 y.o.Male presents today for 3 months follow-up for Musculoskeletal Pain. Patient has a past medical history of Epidermolysis Bullosa; Adenomatous Colon Polyp; Benign Prostatic Hyperplasia; Hypertension; Neutropenia; Alcoholism in Remission; Musculoskeletal Pain; Pelvic Fracture; Elevated Coronary Artery Calcium Score; and Ascending Aorta Dilatation.   History of Chronic Musculoskeletal Pain; Low Back Pain; Arthritis treated with Tramadol  50 mg every 12 hours as needed, which he takes responsibly and as instructed, or ES Tylenol . Still has that Trigger Finger, 3rd Finger on Left Hand that he mentioned back in April, hasn't contacted Orthopedist yet.    History of Hypertension treated with Losartan  100 mg daily and HCTZ 25 mg daily. Blood Pressure: normotensive today at 130/80. Says that his blood pressures at home have been remaining within the 120-130 range systolically.  History of Epidermolysis Bullosa followed regularly by [facility], last seen 07/13/2023. He manages with leg wrappings.    Past Medical History:  Diagnosis Date   Adenomatous polyp    Atypical chest pain    Broken hip (HCC)    Epidermolysis bullosa    History of alcoholism (HCC)    Hypertension    Splenomegaly 08/18/2013    No Known Allergies  Family History  Problem Relation Age of Onset   Hypertension Mother    Diabetes Mother    Heart disease Father    Social History   Social History Narrative   Lost his wife, Idell, in 2024. He has been volunteering 1 night/ week at [employer]. Going to AA, hasn't spoken at those meetings yet. Said previously he plans on staying in his home for right now taking care of his dog, but has thought about making  arrangements to may be move to [facility].   Review of Systems  Musculoskeletal:  Positive for joint pain.       (+) Trigger Finger  All other systems reviewed and are negative.    Objective:  Vitals: BP 130/80   Pulse 77   Ht 5' 10 (1.778 m)   Wt 195 lb (88.5 kg)   SpO2 97%   BMI 27.98 kg/m   Physical Exam Constitutional:      General: He is not in acute distress.    Appearance: Normal appearance. He is not ill-appearing.  HENT:     Head: Normocephalic and atraumatic.  Cardiovascular:     Rate and Rhythm: Normal rate and regular rhythm.     Pulses: Normal pulses.     Heart sounds: Normal heart sounds. No murmur heard.    No friction rub. No gallop.  Pulmonary:     Effort: Pulmonary effort is normal. No respiratory distress.     Breath sounds: Normal breath sounds. No wheezing or rales.  Musculoskeletal:     Comments: Trigger Finger, 3rd Finger of Left Hand noted  Skin:    General: Skin is warm and dry.  Neurological:     Mental Status: He is alert and oriented to person, place, and time. Mental status is at baseline.  Psychiatric:        Mood and Affect: Mood normal.        Behavior: Behavior normal.        Thought Content: Thought content normal.  Judgment: Judgment normal.     Results:  Studies Obtained And Personally Reviewed By Me: Labs:     Component Value Date/Time   NA 139 10/13/2022 1025   NA 140 08/18/2013 1121   K 4.3 10/13/2022 1025   K 4.3 08/18/2013 1121   CL 105 10/13/2022 1025   CO2 26 10/13/2022 1025   CO2 25 08/18/2013 1121   GLUCOSE 100 (H) 10/13/2022 1025   GLUCOSE 92 08/18/2013 1121   BUN 13 10/13/2022 1025   BUN 10.0 08/18/2013 1121   CREATININE 0.87 10/13/2022 1025   CREATININE 0.8 08/18/2013 1121   CALCIUM 9.2 10/13/2022 1025   CALCIUM 9.4 08/18/2013 1121   PROT 6.8 10/13/2022 1025   PROT 6.4 08/18/2013 1121   ALBUMIN 3.4 (L) 05/09/2020 0307   ALBUMIN 3.7 08/18/2013 1121   AST 12 10/13/2022 1025   AST 15 08/18/2013  1121   ALT 8 (L) 10/13/2022 1025   ALT 11 08/18/2013 1121   ALKPHOS 56 05/09/2020 0307   ALKPHOS 55 08/18/2013 1121   BILITOT 0.5 10/13/2022 1025   BILITOT 0.65 08/18/2013 1121   GFRNONAA >60 05/10/2020 0308   GFRNONAA 76 05/08/2020 0922   GFRAA 88 05/08/2020 0922    Lab Results  Component Value Date   WBC 4.6 10/13/2022   HGB 12.1 (L) 10/13/2022   HCT 36.4 (L) 10/13/2022   MCV 98.1 10/13/2022   PLT 212 10/13/2022   Lab Results  Component Value Date   CHOL 132 10/13/2022   HDL 59 10/13/2022   LDLCALC 60 10/13/2022   TRIG 46 10/13/2022   CHOLHDL 2.2 10/13/2022   Lab Results  Component Value Date   HGBA1C 5.3 04/19/2013     Lab Results  Component Value Date   PSA 0.70 10/13/2022   PSA 0.75 10/03/2021   PSA 0.77 05/08/2020     Assessment & Plan:   Orders Placed This Encounter  Procedures   DRUG MONITORING, PANEL 8 WITH CONFIRMATION, URINE   Chronic Musculoskeletal Pain; Low Back Pain; Arthritis treated with Tramadol  50 mg every 12 hours as needed, which he takes responsibly and as instructed, or ES Tylenol .   Trigger Finger, 3rd Finger on Left Hand he mentioned back in April still an issue, he hasn't contacted Orthopedist yet, but is still planning to.    Hypertension treated with Losartan  100 mg daily and HCTZ 25 mg daily. Blood Pressure: normotensive today at 130/80. Says that his blood pressures at home have been remaining within the 120-130 range systolically.  Epidermolysis Bullosa followed regularly by [facility], last seen 07/13/2023. He manages with leg wrappings.  Return in 3 months (on 10/27/2023) for annual labs and then on 11/02/2023 for his annual visit.l    I,Emily Lagle,acting as a scribe for Carlos JINNY Hailstone, MD.,have documented all relevant documentation on the behalf of Carlos JINNY Hailstone, MD,as directed by  Carlos JINNY Hailstone, MD while in the presence of Carlos JINNY Hailstone, MD.   I, Carlos JINNY Hailstone, MD, have reviewed all documentation for this visit.  The documentation on 07/28/23 for the exam, diagnosis, procedures, and orders are all accurate and complete.

## 2023-07-24 ENCOUNTER — Ambulatory Visit: Admitting: Internal Medicine

## 2023-07-24 ENCOUNTER — Encounter: Payer: Self-pay | Admitting: Internal Medicine

## 2023-07-24 VITALS — BP 130/80 | HR 77 | Ht 70.0 in | Wt 195.0 lb

## 2023-07-24 DIAGNOSIS — Q819 Epidermolysis bullosa, unspecified: Secondary | ICD-10-CM | POA: Diagnosis not present

## 2023-07-24 DIAGNOSIS — R609 Edema, unspecified: Secondary | ICD-10-CM

## 2023-07-24 DIAGNOSIS — G8921 Chronic pain due to trauma: Secondary | ICD-10-CM | POA: Diagnosis not present

## 2023-07-24 DIAGNOSIS — I10 Essential (primary) hypertension: Secondary | ICD-10-CM

## 2023-07-25 LAB — DRUG MONITORING, PANEL 8 WITH CONFIRMATION, URINE
6 Acetylmorphine: NEGATIVE ng/mL (ref <10)
Alcohol Metabolites: NEGATIVE ng/mL (ref <500)
Amphetamines: NEGATIVE ng/mL (ref <500)
Benzodiazepines: NEGATIVE ng/mL (ref <100)
Buprenorphine, Urine: NEGATIVE ng/mL (ref <5)
Cocaine Metabolite: NEGATIVE ng/mL (ref <150)
Creatinine: 56.7 mg/dL (ref >=20.0)
MDMA: NEGATIVE ng/mL (ref <500)
Marijuana Metabolite: NEGATIVE ng/mL (ref <20)
Opiates: NEGATIVE ng/mL (ref <100)
Oxidant: NEGATIVE ug/mL (ref <200)
Oxycodone: NEGATIVE ng/mL (ref <100)
pH: 6.4 (ref 4.5–9.0)

## 2023-07-25 LAB — DM TEMPLATE

## 2023-07-27 ENCOUNTER — Encounter (HOSPITAL_BASED_OUTPATIENT_CLINIC_OR_DEPARTMENT_OTHER): Attending: Internal Medicine | Admitting: Internal Medicine

## 2023-07-27 DIAGNOSIS — I87311 Chronic venous hypertension (idiopathic) with ulcer of right lower extremity: Secondary | ICD-10-CM | POA: Diagnosis not present

## 2023-07-27 DIAGNOSIS — Q818 Other epidermolysis bullosa: Secondary | ICD-10-CM | POA: Insufficient documentation

## 2023-07-27 DIAGNOSIS — L97822 Non-pressure chronic ulcer of other part of left lower leg with fat layer exposed: Secondary | ICD-10-CM | POA: Insufficient documentation

## 2023-07-27 DIAGNOSIS — L97812 Non-pressure chronic ulcer of other part of right lower leg with fat layer exposed: Secondary | ICD-10-CM | POA: Diagnosis not present

## 2023-07-27 DIAGNOSIS — I87312 Chronic venous hypertension (idiopathic) with ulcer of left lower extremity: Secondary | ICD-10-CM | POA: Diagnosis not present

## 2023-07-28 ENCOUNTER — Encounter: Payer: Self-pay | Admitting: Internal Medicine

## 2023-07-28 ENCOUNTER — Telehealth: Payer: Self-pay | Admitting: Internal Medicine

## 2023-07-28 ENCOUNTER — Other Ambulatory Visit: Payer: Self-pay | Admitting: Internal Medicine

## 2023-07-28 MED ORDER — TRAMADOL HCL 50 MG PO TABS: ORAL_TABLET | ORAL | 0 refills | Status: DC

## 2023-07-28 NOTE — Patient Instructions (Addendum)
 Tramadol  to be called in next week as discussed. Has wellness visit in the Fall.  Addendum: Tramadol  sent in July 15th for pickup July 16th

## 2023-07-28 NOTE — Telephone Encounter (Signed)
 Refill Tramadol  50 mg #15 tabs to pharmacy to take sparingly every 12 hours as needed for chronic pain. MJB. MD

## 2023-07-29 ENCOUNTER — Telehealth: Payer: Self-pay | Admitting: Internal Medicine

## 2023-08-05 ENCOUNTER — Other Ambulatory Visit: Payer: Self-pay | Admitting: Internal Medicine

## 2023-08-05 ENCOUNTER — Encounter: Payer: Self-pay | Admitting: Internal Medicine

## 2023-08-05 DIAGNOSIS — G8921 Chronic pain due to trauma: Secondary | ICD-10-CM

## 2023-08-05 NOTE — Telephone Encounter (Signed)
 Patient has longstanding history of musculoskeletal pain from past  trauma  and osteoarthritis. Has been taking Tramadol  reliably and responsibly. At last visit, I failed to send in  a prescription for #60 tabs which he has been getting regularly. He only got 12 tabs and is now out.   I am sending in his usual (#60) tabs today. He takes this med every 12 hours as needed. He has signed a pain management agreement previously with this office and is followed regularly. He is drug tested regularly as well.  MJB, MD

## 2023-08-17 ENCOUNTER — Encounter (HOSPITAL_BASED_OUTPATIENT_CLINIC_OR_DEPARTMENT_OTHER): Attending: Internal Medicine | Admitting: Internal Medicine

## 2023-08-17 DIAGNOSIS — L97822 Non-pressure chronic ulcer of other part of left lower leg with fat layer exposed: Secondary | ICD-10-CM | POA: Insufficient documentation

## 2023-08-17 DIAGNOSIS — I87313 Chronic venous hypertension (idiopathic) with ulcer of bilateral lower extremity: Secondary | ICD-10-CM | POA: Diagnosis not present

## 2023-08-17 DIAGNOSIS — L97812 Non-pressure chronic ulcer of other part of right lower leg with fat layer exposed: Secondary | ICD-10-CM | POA: Diagnosis not present

## 2023-08-17 DIAGNOSIS — I87311 Chronic venous hypertension (idiopathic) with ulcer of right lower extremity: Secondary | ICD-10-CM

## 2023-08-17 DIAGNOSIS — Q818 Other epidermolysis bullosa: Secondary | ICD-10-CM | POA: Insufficient documentation

## 2023-08-17 DIAGNOSIS — I87312 Chronic venous hypertension (idiopathic) with ulcer of left lower extremity: Secondary | ICD-10-CM | POA: Diagnosis not present

## 2023-09-03 ENCOUNTER — Other Ambulatory Visit: Payer: Self-pay | Admitting: Internal Medicine

## 2023-09-03 NOTE — Telephone Encounter (Signed)
 Refill due on 8/23, I have pended it to be filled then.

## 2023-09-15 ENCOUNTER — Encounter (HOSPITAL_BASED_OUTPATIENT_CLINIC_OR_DEPARTMENT_OTHER): Attending: Internal Medicine | Admitting: Internal Medicine

## 2023-09-15 DIAGNOSIS — Q818 Other epidermolysis bullosa: Secondary | ICD-10-CM | POA: Diagnosis not present

## 2023-09-15 DIAGNOSIS — I87312 Chronic venous hypertension (idiopathic) with ulcer of left lower extremity: Secondary | ICD-10-CM

## 2023-09-15 DIAGNOSIS — I87313 Chronic venous hypertension (idiopathic) with ulcer of bilateral lower extremity: Secondary | ICD-10-CM | POA: Diagnosis not present

## 2023-09-15 DIAGNOSIS — L97812 Non-pressure chronic ulcer of other part of right lower leg with fat layer exposed: Secondary | ICD-10-CM | POA: Diagnosis not present

## 2023-09-15 DIAGNOSIS — L97822 Non-pressure chronic ulcer of other part of left lower leg with fat layer exposed: Secondary | ICD-10-CM | POA: Diagnosis not present

## 2023-10-06 ENCOUNTER — Other Ambulatory Visit: Payer: Self-pay | Admitting: Internal Medicine

## 2023-10-12 ENCOUNTER — Encounter (HOSPITAL_BASED_OUTPATIENT_CLINIC_OR_DEPARTMENT_OTHER): Admitting: Internal Medicine

## 2023-10-13 ENCOUNTER — Encounter (HOSPITAL_BASED_OUTPATIENT_CLINIC_OR_DEPARTMENT_OTHER): Admitting: Internal Medicine

## 2023-10-13 DIAGNOSIS — I87312 Chronic venous hypertension (idiopathic) with ulcer of left lower extremity: Secondary | ICD-10-CM

## 2023-10-13 DIAGNOSIS — L97822 Non-pressure chronic ulcer of other part of left lower leg with fat layer exposed: Secondary | ICD-10-CM | POA: Diagnosis not present

## 2023-10-13 DIAGNOSIS — L97812 Non-pressure chronic ulcer of other part of right lower leg with fat layer exposed: Secondary | ICD-10-CM | POA: Diagnosis not present

## 2023-10-13 DIAGNOSIS — I87313 Chronic venous hypertension (idiopathic) with ulcer of bilateral lower extremity: Secondary | ICD-10-CM | POA: Diagnosis not present

## 2023-10-13 DIAGNOSIS — Q818 Other epidermolysis bullosa: Secondary | ICD-10-CM | POA: Diagnosis not present

## 2023-10-21 ENCOUNTER — Other Ambulatory Visit: Payer: Self-pay | Admitting: Internal Medicine

## 2023-10-27 ENCOUNTER — Other Ambulatory Visit

## 2023-10-27 DIAGNOSIS — Z13220 Encounter for screening for lipoid disorders: Secondary | ICD-10-CM | POA: Diagnosis not present

## 2023-10-27 DIAGNOSIS — R609 Edema, unspecified: Secondary | ICD-10-CM | POA: Diagnosis not present

## 2023-10-27 DIAGNOSIS — G8921 Chronic pain due to trauma: Secondary | ICD-10-CM

## 2023-10-27 DIAGNOSIS — Z0000 Encounter for general adult medical examination without abnormal findings: Secondary | ICD-10-CM

## 2023-10-27 DIAGNOSIS — I10 Essential (primary) hypertension: Secondary | ICD-10-CM

## 2023-10-28 LAB — COMPREHENSIVE METABOLIC PANEL WITH GFR
AG Ratio: 1.7 (calc) (ref 1.0–2.5)
ALT: 8 U/L — ABNORMAL LOW (ref 9–46)
AST: 12 U/L (ref 10–35)
Albumin: 4.3 g/dL (ref 3.6–5.1)
Alkaline phosphatase (APISO): 63 U/L (ref 35–144)
BUN: 18 mg/dL (ref 7–25)
CO2: 27 mmol/L (ref 20–32)
Calcium: 9.5 mg/dL (ref 8.6–10.3)
Chloride: 98 mmol/L (ref 98–110)
Creat: 1.15 mg/dL (ref 0.70–1.22)
Globulin: 2.5 g/dL (ref 1.9–3.7)
Glucose, Bld: 96 mg/dL (ref 65–99)
Potassium: 4.5 mmol/L (ref 3.5–5.3)
Sodium: 134 mmol/L — ABNORMAL LOW (ref 135–146)
Total Bilirubin: 0.6 mg/dL (ref 0.2–1.2)
Total Protein: 6.8 g/dL (ref 6.1–8.1)
eGFR: 63 mL/min/1.73m2 (ref >=60)

## 2023-10-28 LAB — CBC WITH DIFFERENTIAL/PLATELET
Absolute Lymphocytes: 607 {cells}/uL — ABNORMAL LOW (ref 850–3900)
Absolute Monocytes: 387 {cells}/uL (ref 200–950)
Basophils Absolute: 31 {cells}/uL (ref 0–200)
Basophils Relative: 0.7 %
Eosinophils Absolute: 62 {cells}/uL (ref 15–500)
Eosinophils Relative: 1.4 %
HCT: 36.7 % — ABNORMAL LOW (ref 38.5–50.0)
Hemoglobin: 12.2 g/dL — ABNORMAL LOW (ref 13.2–17.1)
MCH: 32.9 pg (ref 27.0–33.0)
MCHC: 33.2 g/dL (ref 32.0–36.0)
MCV: 98.9 fL (ref 80.0–100.0)
MPV: 8.8 fL (ref 7.5–12.5)
Monocytes Relative: 8.8 %
Neutro Abs: 3313 {cells}/uL (ref 1500–7800)
Neutrophils Relative %: 75.3 %
Platelets: 205 Thousand/uL (ref 140–400)
RBC: 3.71 Million/uL — ABNORMAL LOW (ref 4.20–5.80)
RDW: 11.5 % (ref 11.0–15.0)
Total Lymphocyte: 13.8 %
WBC: 4.4 Thousand/uL (ref 3.8–10.8)

## 2023-10-28 LAB — PSA: PSA: 0.76 ng/mL (ref <=4.00)

## 2023-10-28 LAB — LIPID PANEL
Cholesterol: 138 mg/dL (ref <200)
HDL: 57 mg/dL (ref >=40)
LDL Cholesterol (Calc): 68 mg/dL
Non-HDL Cholesterol (Calc): 81 mg/dL (ref <130)
Total CHOL/HDL Ratio: 2.4 (calc) (ref <5.0)
Triglycerides: 45 mg/dL (ref <150)

## 2023-10-30 NOTE — Progress Notes (Signed)
 Annual Wellness Visit   Patient Care Team: [name], MD as PCP - General (Internal Medicine)  Visit Date: 11/02/23   Chief Complaint  Patient presents with   Annual Exam   Medicare Wellness   Subjective:  Patient: Carlos Mauss., Male DOB: 1940/03/31, 83 y.o. MRN: [id number] Vitals:   11/02/23 0947  BP: 120/80   Carlos VEAR Reser Mickey. is a 83 y.o. Male who presents today for his Annual Wellness Visit. Patient has Epidermolysis bullosa; Adenomatous colon polyp; Benign prostatic hyperplasia; Hypertension; Neutropenia; Alcoholism in remission (HCC); Musculoskeletal pain; Pelvic fracture (HCC); Elevated coronary artery calcium score; and Ascending aorta dilatation on their problem list.  Says he volunteers at [employer] once a week.    Says that he notices that he has slow bowel moments. [place] that his mother also had a problem with her bowel movements as she got older.    History of Chronic Low Back Pain; History of Closed Fracture of Left Acetabulum in 2022. Musculoskeletal pain is treated with Tramadol  50 mg every 12 hours as needed or Extra Strength Tylenol .     History of Epidermolysis Bullosa followed at [facility] monthly. Managed with leg wrappings.     History of Hypertension treated with Losartan  100 mg daily. Blood Pressure: normal today at 120/80.     History of alcoholism, in remission, going to [employer] Has been active in the AA community for years.   History of Benign Prostatic Hyperplasia treated with Flomax  0.4 mg.   10/27/2023 PSA was 0.76.       Patient was admitted to the hospital in April 2022 after a fall at home on his left hip. He sustained a left pelvic fracture of the pubic rami. He subsequently had to go to rehab for short while before returning home. He is completely healed from this nail.    No known drug allergies. History of essential hypertension stable on current regimen. BPH treated with Flomax . History of epidermolysis  bullosa affecting his lower extremities. He has been seen at wound care center.   Labs 10/27/2023  RBC 3.71, Hemoglobin 12.2, HCT 36.7. Absolute lymphocytes. Sodium 134, ALT 8, Otherwise WNL.     11/22/2023 Coronary Calcium Score 2280.     11/10/2014 Colonoscopy Diverticulosis in the sigmoid colon. One 4 mm polyp in the cecum. Found to be tubular adenoma. One 3 mm polyp in the distal transverse colon. Found to be benign polypoid colonic mucosa. The examined portion of the ileum was normal. The distal rectum and anal verge are normal on retroflexion view. Repeat in 5 years.   Vaccine counseling: Covid-19 vaccine due.  Health Maintenance  Topic Date Due   Influenza Vaccine  08/14/2023   COVID-19 Vaccine (7 - 2025-26 season) 09/14/2023   Medicare Annual Wellness (AWV)  10/14/2023   DTaP/Tdap/Td (4 - Td or Tdap) 12/03/2031   Pneumococcal Vaccine: 50+ Years  Completed   Zoster Vaccines- Shingrix  Completed   Meningococcal B Vaccine  Aged Out   Review of Systems  Constitutional:  Negative for fever and malaise/fatigue.  HENT:  Negative for congestion.   Eyes:  Negative for blurred vision.  Respiratory:  Negative for cough and shortness of breath.   Cardiovascular:  Negative for chest pain, palpitations and leg swelling.  Gastrointestinal:  Negative for vomiting.  Musculoskeletal:  Negative for back pain.  Skin:  Negative for rash.  Neurological:  Negative for loss of consciousness and headaches.   Objective:  Vitals:  body mass index is 27.41 kg/m. Today's Vitals   11/02/23 0947  BP: 120/80  Pulse: 83  SpO2: 97%  Weight: 191 lb (86.6 kg)  Height: 5' 10 (1.778 m)  PainSc: 0-No pain   Physical Exam Vitals and nursing note reviewed. Exam conducted with a chaperone present (Araceli [place], CMA).  Constitutional:      General: He is awake. He is not in acute distress.    Appearance: Normal appearance. He is not ill-appearing or toxic-appearing.  HENT:     Head: Normocephalic  and atraumatic.     Right Ear: Tympanic membrane, ear canal and external ear normal.     Left Ear: Tympanic membrane, ear canal and external ear normal.     Mouth/Throat:     Pharynx: Oropharynx is clear.  Eyes:     Extraocular Movements: Extraocular movements intact.     Pupils: Pupils are equal, round, and reactive to light.  Neck:     Thyroid: No thyroid mass, thyromegaly or thyroid tenderness.     Vascular: No carotid bruit.  Cardiovascular:     Rate and Rhythm: Normal rate and regular rhythm. No extrasystoles are present.    Pulses:          Dorsalis pedis pulses are 2+ on the right side and 2+ on the left side.       Posterior tibial pulses are 2+ on the right side and 2+ on the left side.     Heart sounds: Normal heart sounds. No murmur heard.    No friction rub. No gallop.  Pulmonary:     Effort: Pulmonary effort is normal.     Breath sounds: Normal breath sounds. No decreased breath sounds, wheezing, rhonchi or rales.  Chest:     Chest wall: No mass.  Abdominal:     Palpations: Abdomen is soft. There is no hepatomegaly, splenomegaly or mass.     Tenderness: There is no abdominal tenderness.     Hernia: No hernia is present.  Genitourinary:    Prostate: Normal. Not enlarged, not tender and no nodules present.     Rectum: Normal. Guaiac result negative.  Musculoskeletal:     Cervical back: Normal range of motion.     Right lower leg: No edema.     Left lower leg: No edema.  Lymphadenopathy:     Cervical: No cervical adenopathy.     Upper Body:     Right upper body: No supraclavicular adenopathy.     Left upper body: No supraclavicular adenopathy.  Skin:    General: Skin is warm and dry.  Neurological:     General: No focal deficit present.     Mental Status: He is alert and oriented to person, place, and time. Mental status is at baseline.     Cranial Nerves: Cranial nerves 2-12 are intact.     Sensory: Sensation is intact.     Motor: Motor function is intact.      Coordination: Coordination is intact.     Gait: Gait is intact.     Deep Tendon Reflexes: Reflexes are normal and symmetric.  Psychiatric:        Attention and Perception: Attention normal.        Mood and Affect: Mood normal.        Speech: Speech normal.        Behavior: Behavior normal. Behavior is cooperative.        Thought Content: Thought content normal.  Cognition and Memory: Cognition and memory normal.        Judgment: Judgment normal.     Current Outpatient Medications  Medication Instructions   acetaminophen  (TYLENOL ) 1,000 mg, Every 6 hours PRN   ASPIRIN 81 PO 81 mg, Daily   bisacodyl (DULCOLAX) 10 mg, Daily   cetirizine (ZYRTEC) 10 mg, Daily   docusate sodium  (COLACE) 100 mg, Daily PRN   hydrochlorothiazide  (HYDRODIURIL ) 25 mg, Oral, Daily   losartan  (COZAAR ) 100 mg, Oral, Daily   Multiple Vitamin (MULTIVITAMIN WITH MINERALS) TABS tablet 1 tablet, Daily   Propylene Glycol (SYSTANE BALANCE OP) 1 drop, Daily PRN   tamsulosin  (FLOMAX ) 0.4 mg, Oral, Daily   traMADol  (ULTRAM ) 50 mg, Oral, Every 12 hours   Past Medical History:  Diagnosis Date   Adenomatous polyp    Atypical chest pain    Broken hip (HCC)    Epidermolysis bullosa    History of alcoholism (HCC)    Hypertension    Splenomegaly 08/18/2013   Medical/Surgical History Narrative:  Allergic/Intolerant to: No Known Allergies   Past Surgical History:  Procedure Laterality Date   left leg surgery     Family History  Problem Relation Age of Onset   Hypertension Mother    Diabetes Mother    Heart disease Father    Family History Narrative: 1 sister in good health. Father died of congestive heart failure with history of prostate cancer. Mother died with history of hypertension and dementia. Paternal aunt with history of dementia and coronary disease. Maternal uncle with history of coronary disease died of an MI.  Social History   Social History Narrative   Lost his wife, Carlos White, in 2024. He has  been volunteering 1 night/ week at [employer]. Going to AA, hasn't spoken at those meetings yet. Said previously he plans on staying in his home for right now taking care of his dog, but has thought about making arrangements to may be move to [facility].   Most Recent Health Risks Assessment:   Medicare Risk at Home - 11/02/23 1002     Any stairs in or around the home? Yes    If so, are there any without handrails? No    Home free of loose throw rugs in walkways, pet beds, electrical cords, etc? Yes    Adequate lighting in your home to reduce risk of falls? Yes    Life alert? No    Use of a cane, walker or w/c? Yes    Grab bars in the bathroom? Yes    Shower chair or bench in shower? Yes    Elevated toilet seat or a handicapped toilet? Yes         Most Recent Social Determinants of Health (Including Hx of Tobacco, Alcohol, and Drug Use) SDOH Screenings   Food Insecurity: No Food Insecurity (10/12/2022)  Housing: Low Risk  (10/12/2022)  Transportation Needs: No Transportation Needs (10/12/2022)  Utilities: Not At Risk (10/12/2022)  Alcohol Screen: Low Risk  (10/03/2020)  Depression (PHQ2-9): Low Risk  (04/16/2023)  Financial Resource Strain: Low Risk  (10/12/2022)  Physical Activity: Insufficiently Active (10/12/2022)  Social Connections: Moderately Integrated (10/12/2022)  Stress: No Stress Concern Present (10/12/2022)  Tobacco Use: Medium Risk (11/02/2023)  Health Literacy: Adequate Health Literacy (10/12/2022)   Social History   Tobacco Use   Smoking status: Former    Current packs/day: 0.00    Average packs/day: 2.0 packs/day for 20.0 years (40.0 ttl pk-yrs)    Types: Cigarettes  Start date: 01/14/1963    Quit date: 01/14/1983    Years since quitting: 40.8   Smokeless tobacco: Never  Substance Use Topics   Alcohol use: No   Drug use: No   Most Recent Functional Status Assessment:    11/02/2023   10:01 AM  In your present state of health, do you have any difficulty  performing the following activities:  Hearing? 1  Vision? 0  Difficulty concentrating or making decisions? 1  Walking or climbing stairs? 0  Dressing or bathing? 0  Doing errands, shopping? 0  Preparing Food and eating ? N  Using the Toilet? N  In the past six months, have you accidently leaked urine? N  Do you have problems with loss of bowel control? N  Managing your Medications? N  Managing your Finances? N  Housekeeping or managing your Housekeeping? N   Most Recent Fall Risk Assessment:    11/02/2023   10:01 AM  Fall Risk   Falls in the past year? 1  Number falls in past yr: 0  Injury with Fall? 0  Risk for fall due to : Other (Comment)  Follow up Falls prevention discussed;Education provided;Falls evaluation completed   Most Recent Anxiety/Depression Screenings:    04/16/2023    9:27 AM 01/20/2023    9:40 AM  PHQ 2/9 Scores  PHQ - 2 Score 0 0    Most Recent Cognitive Screening:    11/02/2023   10:03 AM  6CIT Screen  What Year? 0 points  What month? 0 points  What time? 0 points  Count back from 20 0 points  Months in reverse 0 points  Repeat phrase 0 points  Total Score 0 points   Most Recent Vision/Hearing Screenings:No results found. Results:  Studies Obtained And Personally Reviewed By Me: Diabetic Foot Exam - Simple   No data filed     11/22/2023 Coronary Calcium Score 2280.     11/10/2014 Colonoscopy Diverticulosis in the sigmoid colon. One 4 mm polyp in the cecum. Found to be tubular adenoma. One 3 mm polyp in the distal transverse colon. Found to be benign polypoid colonic mucosa. The examined portion of the ileum was normal. The distal rectum and anal verge are normal on retroflexion view. Repeat in 5 years.   Labs:  CBC w/ Differential Lab Results  Component Value Date   WBC 4.4 10/27/2023   RBC 3.71 (L) 10/27/2023   HGB 12.2 (L) 10/27/2023   HCT 36.7 (L) 10/27/2023   PLT 205 10/27/2023   MCV 98.9 10/27/2023   MCH 32.9 10/27/2023    MCHC 33.2 10/27/2023   RDW 11.5 10/27/2023   MPV 8.8 10/27/2023   LYMPHSABS 598 (L) 10/13/2022   MONOABS 0.6 05/10/2020   BASOSABS 31 10/27/2023    Comprehensive Metabolic Panel Lab Results  Component Value Date   NA 134 (L) 10/27/2023   K 4.5 10/27/2023   CL 98 10/27/2023   CO2 27 10/27/2023   GLUCOSE 96 10/27/2023   BUN 18 10/27/2023   CREATININE 1.15 10/27/2023   CALCIUM 9.5 10/27/2023   PROT 6.8 10/27/2023   ALBUMIN 3.4 (L) 05/09/2020   AST 12 10/27/2023   ALT 8 (L) 10/27/2023   ALKPHOS 56 05/09/2020   BILITOT 0.6 10/27/2023   EGFR 63 10/27/2023   GFRNONAA >60 05/10/2020   Lipid Panel  Lab Results  Component Value Date   CHOL 138 10/27/2023   HDL 57 10/27/2023   LDLCALC 68 10/27/2023   TRIG 45 10/27/2023  A1c Lab Results  Component Value Date   HGBA1C 5.3 04/19/2013    TSH No results found for: TSH PSA 10/27/2023 0.76 No results found for any visits on 11/02/23. Assessment & Plan:   Orders Placed This Encounter  Procedures   POCT URINALYSIS DIP (CLINITEK)    Says that he notices that he has slow bowel moments. [place] that his mother also had a problem with her bowel movements as she got older.     Recommended to take some Miralax  with coffee or water in the mornings.   Chronic Low Back Pain; History of Closed Fracture of Left Acetabulum in 2022. Musculoskeletal pain is treated with Tramadol  50 mg every 12 hours as needed or Extra Strength Tylenol .     Epidermolysis Bullosa followed at [facility] monthly. Managed with leg wrappings.     Hypertension treated with Losartan  100 mg daily. Blood Pressure: normal today at 120/80.     History of alcoholism, in remission, going to [employer] Has been active in the AA community for years.   Benign Prostatic Hyperplasia treated with Flomax  0.4 mg daily.   10/27/2023 PSA was 0.76.        11/22/2023 Coronary Calcium Score 2280.     11/10/2014 Colonoscopy Diverticulosis in the sigmoid colon.  One 4 mm polyp in the cecum. Found to be tubular adenoma. One 3 mm polyp in the distal transverse colon. Found to be benign polypoid colonic mucosa. The examined portion of the ileum was normal. The distal rectum and anal verge are normal on retroflexion view. Repeat in 5 years.   Vaccine counseling: Covid-19 vaccine due.      Annual Wellness Visit done today including the all of the following: Reviewed patient's Family Medical History Reviewed patient's SDOH and reviewed tobacco, alcohol, and drug use.  Reviewed and updated list of patient's medical providers Assessment of cognitive impairment was done Assessed patient's functional ability Established a written schedule for health screening services Health Risk Assessent Completed and Reviewed  Discussed health benefits of physical activity, and encouraged him to engage in regular exercise appropriate for his age and condition.    I,Makayla C Reid,acting as a scribe for Ronal JINNY Hailstone, MD.,have documented all relevant documentation on the behalf of Ronal JINNY Hailstone, MD,as directed by  Ronal JINNY Hailstone, MD while in the presence of Ronal JINNY Hailstone, MD.  I, Ronal JINNY Hailstone, MD, have reviewed all documentation for and agree with the above Annual Wellness Visit documentation.  Ronal JINNY Hailstone, MD Internal Medicine 11/02/2023

## 2023-11-02 ENCOUNTER — Ambulatory Visit (INDEPENDENT_AMBULATORY_CARE_PROVIDER_SITE_OTHER): Admitting: Internal Medicine

## 2023-11-02 ENCOUNTER — Encounter: Payer: Self-pay | Admitting: Internal Medicine

## 2023-11-02 VITALS — BP 120/80 | HR 83 | Ht 70.0 in | Wt 191.0 lb

## 2023-11-02 DIAGNOSIS — Q819 Epidermolysis bullosa, unspecified: Secondary | ICD-10-CM | POA: Diagnosis not present

## 2023-11-02 DIAGNOSIS — I77810 Thoracic aortic ectasia: Secondary | ICD-10-CM | POA: Diagnosis not present

## 2023-11-02 DIAGNOSIS — Z87438 Personal history of other diseases of male genital organs: Secondary | ICD-10-CM | POA: Diagnosis not present

## 2023-11-02 DIAGNOSIS — Z8249 Family history of ischemic heart disease and other diseases of the circulatory system: Secondary | ICD-10-CM | POA: Diagnosis not present

## 2023-11-02 DIAGNOSIS — Z0000 Encounter for general adult medical examination without abnormal findings: Secondary | ICD-10-CM

## 2023-11-02 DIAGNOSIS — I10 Essential (primary) hypertension: Secondary | ICD-10-CM | POA: Diagnosis not present

## 2023-11-02 DIAGNOSIS — Z1211 Encounter for screening for malignant neoplasm of colon: Secondary | ICD-10-CM | POA: Diagnosis not present

## 2023-11-02 DIAGNOSIS — R609 Edema, unspecified: Secondary | ICD-10-CM

## 2023-11-02 DIAGNOSIS — G8921 Chronic pain due to trauma: Secondary | ICD-10-CM

## 2023-11-02 DIAGNOSIS — R931 Abnormal findings on diagnostic imaging of heart and coronary circulation: Secondary | ICD-10-CM | POA: Diagnosis not present

## 2023-11-02 DIAGNOSIS — D649 Anemia, unspecified: Secondary | ICD-10-CM

## 2023-11-02 LAB — HEMOCCULT GUIAC POC 1CARD (OFFICE): Fecal Occult Blood, POC: NEGATIVE

## 2023-11-02 LAB — POCT URINALYSIS DIP (CLINITEK)
Bilirubin, UA: NEGATIVE
Blood, UA: NEGATIVE
Glucose, UA: NEGATIVE mg/dL
Ketones, POC UA: NEGATIVE mg/dL
Leukocytes, UA: NEGATIVE
Nitrite, UA: NEGATIVE
POC PROTEIN,UA: NEGATIVE
Spec Grav, UA: 1.01 (ref 1.010–1.025)
Urobilinogen, UA: 0.2 U/dL
pH, UA: 6.5 (ref 5.0–8.0)

## 2023-11-03 ENCOUNTER — Encounter: Payer: Self-pay | Admitting: Internal Medicine

## 2023-11-03 DIAGNOSIS — H35033 Hypertensive retinopathy, bilateral: Secondary | ICD-10-CM | POA: Diagnosis not present

## 2023-11-03 DIAGNOSIS — Z961 Presence of intraocular lens: Secondary | ICD-10-CM | POA: Diagnosis not present

## 2023-11-03 DIAGNOSIS — H43813 Vitreous degeneration, bilateral: Secondary | ICD-10-CM | POA: Diagnosis not present

## 2023-11-03 DIAGNOSIS — H353132 Nonexudative age-related macular degeneration, bilateral, intermediate dry stage: Secondary | ICD-10-CM | POA: Diagnosis not present

## 2023-11-03 NOTE — Patient Instructions (Signed)
 Labs are stable. CPE performed. Continue Tramadol  for chronic pain. He will be seen in [place] for chronic pain management visit. No change in Medications.Suggest influenza and Covid vaccines.

## 2023-11-04 ENCOUNTER — Other Ambulatory Visit: Payer: Self-pay | Admitting: Internal Medicine

## 2023-11-09 DIAGNOSIS — H53413 Scotoma involving central area, bilateral: Secondary | ICD-10-CM | POA: Diagnosis not present

## 2023-11-10 ENCOUNTER — Encounter (HOSPITAL_BASED_OUTPATIENT_CLINIC_OR_DEPARTMENT_OTHER): Attending: Internal Medicine | Admitting: Internal Medicine

## 2023-11-10 DIAGNOSIS — L97822 Non-pressure chronic ulcer of other part of left lower leg with fat layer exposed: Secondary | ICD-10-CM | POA: Insufficient documentation

## 2023-11-10 DIAGNOSIS — Q818 Other epidermolysis bullosa: Secondary | ICD-10-CM | POA: Insufficient documentation

## 2023-11-10 DIAGNOSIS — I87312 Chronic venous hypertension (idiopathic) with ulcer of left lower extremity: Secondary | ICD-10-CM | POA: Insufficient documentation

## 2023-11-11 DIAGNOSIS — I87312 Chronic venous hypertension (idiopathic) with ulcer of left lower extremity: Secondary | ICD-10-CM | POA: Diagnosis not present

## 2023-11-16 DIAGNOSIS — H53413 Scotoma involving central area, bilateral: Secondary | ICD-10-CM | POA: Diagnosis not present

## 2023-11-24 DIAGNOSIS — H53413 Scotoma involving central area, bilateral: Secondary | ICD-10-CM | POA: Diagnosis not present

## 2023-11-24 NOTE — Progress Notes (Signed)
 Patient Care Team: [name], MD as PCP - General (Internal Medicine)  Visit Date: 11/27/23  Subjective:    Patient ID: Carlos White. , Male   DOB: 05-06-1940, 83 y.o.    MRN: [id number]   83 y.o. Male presents today for Pain Management. Patient has a past medical history of Chronic low back pain, Hypertension, Alcoholism.  History of Chronic Low Back Pain; History of Closed Fracture of Left Acetabulum in 2022. Musculoskeletal pain is treated with Tramadol  50 mg every 12 hours as needed or Extra Strength Tylenol . He takes his medications reliably and responsibly.      History of Hypertension treated with Losartan  100 mg daily. Blood Pressure: normal today at 120/70.   Alcoholism in recovery for many years and doing well. 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 Has been active in the AA community for many years.    Past Medical History:  Diagnosis Date   Adenomatous polyp    Atypical chest pain    Broken hip (HCC)    Epidermolysis bullosa    History of alcoholism (HCC)    Hypertension    Splenomegaly 08/18/2013     Family History  Problem Relation Age of Onset   Hypertension Mother    Diabetes Mother    Heart disease Father     Social History   Social History Narrative   Lost his wife, Idell, in 2024. He has been volunteering 1 night/ week at [employer]. Going to AA, hasn't spoken at those meetings yet. Said previously he plans on staying in his home for right now taking care of his dog, but has thought about making arrangements to may be move to [facility].      Review of Systems  All other systems reviewed and are negative.       Objective:   Vitals: BP 120/70   Pulse 84   Ht 5' 10 (1.778 m)   Wt 190 lb (86.2 kg)   SpO2 97%   BMI 27.26 kg/m    Physical Exam Vitals and nursing note reviewed.     Chest is clear. Cor:RRR No pitting edema of LEs. Affect, thought, and judgement are normal.  Results:    Labs:       Component Value Date/Time   NA 134 (L) 10/27/2023 1221   NA 140 08/18/2013 1121   K 4.5 10/27/2023 1221   K 4.3 08/18/2013 1121   CL 98 10/27/2023 1221   CO2 27 10/27/2023 1221   CO2 25 08/18/2013 1121   GLUCOSE 96 10/27/2023 1221   GLUCOSE 92 08/18/2013 1121   BUN 18 10/27/2023 1221   BUN 10.0 08/18/2013 1121   CREATININE 1.15 10/27/2023 1221   CREATININE 0.8 08/18/2013 1121   CALCIUM 9.5 10/27/2023 1221   CALCIUM 9.4 08/18/2013 1121   PROT 6.8 10/27/2023 1221   PROT 6.4 08/18/2013 1121   ALBUMIN 3.4 (L) 05/09/2020 0307   ALBUMIN 3.7 08/18/2013 1121   AST 12 10/27/2023 1221   AST 15 08/18/2013 1121   ALT 8 (L) 10/27/2023 1221   ALT 11 08/18/2013 1121   ALKPHOS 56 05/09/2020 0307   ALKPHOS 55 08/18/2013 1121   BILITOT 0.6 10/27/2023 1221   BILITOT 0.65 08/18/2013 1121   GFRNONAA >60 05/10/2020 0308   GFRNONAA 76 05/08/2020 0922   GFRAA 88 05/08/2020 0922     Lab Results  Component Value Date   WBC 4.4 10/27/2023  HGB 12.2 (L) 10/27/2023   HCT 36.7 (L) 10/27/2023   MCV 98.9 10/27/2023   PLT 205 10/27/2023    Lab Results  Component Value Date   CHOL 138 10/27/2023   HDL 57 10/27/2023   LDLCALC 68 10/27/2023   TRIG 45 10/27/2023   CHOLHDL 2.4 10/27/2023    Lab Results  Component Value Date   HGBA1C 5.3 04/19/2013      Lab Results  Component Value Date   PSA 0.76 10/27/2023   PSA 0.70 10/13/2022   PSA 0.75 10/03/2021     Assessment & Plan:   Encounter for chronic pain management for musculoskeletal pain treated with Tramadol .  Chronic Low Back Pain: History of Closed Fracture of Left Acetabulum in 2022. Musculoskeletal pain is treated with Tramadol  50 mg every 12 hours as needed or Extra Strength Tylenol . He takes his medications reliably and responsibly.      Hypertension: treated with Losartan  100 mg daily. Blood Pressure: normal today at 120/70.   History of alcoholism, in remission, going to [employer] Has been active in the  AA community for years.   He is seen  every 3 months for pain management follow up.  I,Makayla C Reid,acting as a scribe for Ronal JINNY Hailstone, MD.,have documented all relevant documentation on the behalf of Ronal JINNY Hailstone, MD,as directed by  Ronal JINNY Hailstone, MD while in the presence of Ronal JINNY Hailstone, MD.    I, Ronal JINNY Hailstone, MD, have reviewed all documentation for this visit. The documentation on 11/27/2023 for the exam, diagnosis, procedures, and orders are all accurate and complete.

## 2023-11-27 ENCOUNTER — Encounter: Payer: Self-pay | Admitting: Internal Medicine

## 2023-11-27 ENCOUNTER — Ambulatory Visit: Payer: Self-pay | Admitting: Internal Medicine

## 2023-11-27 VITALS — BP 120/70 | HR 84 | Ht 70.0 in | Wt 190.0 lb

## 2023-11-27 DIAGNOSIS — I10 Essential (primary) hypertension: Secondary | ICD-10-CM | POA: Diagnosis not present

## 2023-11-27 DIAGNOSIS — G8921 Chronic pain due to trauma: Secondary | ICD-10-CM | POA: Diagnosis not present

## 2023-11-27 DIAGNOSIS — F1021 Alcohol dependence, in remission: Secondary | ICD-10-CM

## 2023-11-27 DIAGNOSIS — Z0283 Encounter for blood-alcohol and blood-drug test: Secondary | ICD-10-CM

## 2023-11-27 LAB — TIQ-MISC: QUESTION:: 19733

## 2023-11-27 NOTE — Patient Instructions (Addendum)
 Urine drug screen obtained today. Continue Tramadol  as prescribed.Continue Losartan  for hypertension.

## 2023-11-29 ENCOUNTER — Ambulatory Visit: Payer: Self-pay | Admitting: Internal Medicine

## 2023-11-30 DIAGNOSIS — H53413 Scotoma involving central area, bilateral: Secondary | ICD-10-CM | POA: Diagnosis not present

## 2023-11-30 LAB — DRUG MONITORING, PANEL 8 WITH CONFIRMATION, URINE
6 Acetylmorphine: NEGATIVE ng/mL (ref <10)
Alcohol Metabolites: NEGATIVE ng/mL (ref <500)
Amphetamines: NEGATIVE ng/mL (ref <500)
Benzodiazepines: NEGATIVE ng/mL (ref <100)
Buprenorphine, Urine: NEGATIVE ng/mL (ref <5)
Cocaine Metabolite: NEGATIVE ng/mL (ref <150)
Creatinine: 86.5 mg/dL (ref >=20.0)
MDMA: NEGATIVE ng/mL (ref <500)
Marijuana Metabolite: NEGATIVE ng/mL (ref <20)
Opiates: NEGATIVE ng/mL (ref <100)
Oxidant: NEGATIVE ug/mL (ref <200)
Oxycodone: NEGATIVE ng/mL (ref <100)
pH: 6.4 (ref 4.5–9.0)

## 2023-11-30 LAB — TEST AUTHORIZATION

## 2023-11-30 LAB — DM TEMPLATE

## 2023-12-07 ENCOUNTER — Other Ambulatory Visit: Payer: Self-pay | Admitting: Internal Medicine

## 2023-12-08 ENCOUNTER — Encounter (HOSPITAL_BASED_OUTPATIENT_CLINIC_OR_DEPARTMENT_OTHER): Attending: Internal Medicine | Admitting: Internal Medicine

## 2023-12-08 DIAGNOSIS — I87311 Chronic venous hypertension (idiopathic) with ulcer of right lower extremity: Secondary | ICD-10-CM | POA: Diagnosis not present

## 2023-12-08 DIAGNOSIS — L97812 Non-pressure chronic ulcer of other part of right lower leg with fat layer exposed: Secondary | ICD-10-CM | POA: Diagnosis not present

## 2023-12-08 DIAGNOSIS — I87312 Chronic venous hypertension (idiopathic) with ulcer of left lower extremity: Secondary | ICD-10-CM

## 2023-12-08 DIAGNOSIS — I87313 Chronic venous hypertension (idiopathic) with ulcer of bilateral lower extremity: Secondary | ICD-10-CM | POA: Diagnosis not present

## 2023-12-08 DIAGNOSIS — L97822 Non-pressure chronic ulcer of other part of left lower leg with fat layer exposed: Secondary | ICD-10-CM

## 2023-12-08 DIAGNOSIS — Q818 Other epidermolysis bullosa: Secondary | ICD-10-CM | POA: Diagnosis not present

## 2023-12-14 DIAGNOSIS — H53413 Scotoma involving central area, bilateral: Secondary | ICD-10-CM | POA: Diagnosis not present

## 2023-12-24 NOTE — Telephone Encounter (Signed)
 done

## 2024-01-06 ENCOUNTER — Other Ambulatory Visit: Payer: Self-pay | Admitting: Internal Medicine

## 2024-01-11 ENCOUNTER — Telehealth: Payer: Self-pay | Admitting: Internal Medicine

## 2024-01-11 NOTE — Telephone Encounter (Signed)
 Done

## 2024-01-19 ENCOUNTER — Encounter (HOSPITAL_BASED_OUTPATIENT_CLINIC_OR_DEPARTMENT_OTHER): Attending: Internal Medicine | Admitting: Internal Medicine

## 2024-01-19 DIAGNOSIS — I87313 Chronic venous hypertension (idiopathic) with ulcer of bilateral lower extremity: Secondary | ICD-10-CM | POA: Insufficient documentation

## 2024-01-19 DIAGNOSIS — L97812 Non-pressure chronic ulcer of other part of right lower leg with fat layer exposed: Secondary | ICD-10-CM | POA: Diagnosis not present

## 2024-01-19 DIAGNOSIS — Q818 Other epidermolysis bullosa: Secondary | ICD-10-CM | POA: Diagnosis not present

## 2024-01-19 DIAGNOSIS — L97822 Non-pressure chronic ulcer of other part of left lower leg with fat layer exposed: Secondary | ICD-10-CM | POA: Diagnosis not present

## 2024-01-19 DIAGNOSIS — I87311 Chronic venous hypertension (idiopathic) with ulcer of right lower extremity: Secondary | ICD-10-CM | POA: Diagnosis present

## 2024-02-09 ENCOUNTER — Other Ambulatory Visit: Payer: Self-pay | Admitting: Internal Medicine

## 2024-02-16 ENCOUNTER — Encounter (HOSPITAL_BASED_OUTPATIENT_CLINIC_OR_DEPARTMENT_OTHER): Admitting: Internal Medicine

## 2024-02-22 ENCOUNTER — Encounter (HOSPITAL_BASED_OUTPATIENT_CLINIC_OR_DEPARTMENT_OTHER): Admitting: Internal Medicine
